# Patient Record
Sex: Female | Born: 1960 | Race: White | Hispanic: No | Smoking: Former smoker
Health system: Southern US, Community
[De-identification: ages and names within clinical notes are randomized; demographics above are authoritative.]

## PROBLEM LIST (undated history)

## (undated) DIAGNOSIS — E0590 Thyrotoxicosis, unspecified without thyrotoxic crisis or storm: Secondary | ICD-10-CM

## (undated) DIAGNOSIS — M1990 Unspecified osteoarthritis, unspecified site: Secondary | ICD-10-CM

## (undated) DIAGNOSIS — M545 Low back pain: Secondary | ICD-10-CM

## (undated) DIAGNOSIS — F1910 Other psychoactive substance abuse, uncomplicated: Secondary | ICD-10-CM

## (undated) DIAGNOSIS — M25552 Pain in left hip: Secondary | ICD-10-CM

## (undated) DIAGNOSIS — E785 Hyperlipidemia, unspecified: Secondary | ICD-10-CM

## (undated) DIAGNOSIS — L570 Actinic keratosis: Secondary | ICD-10-CM

## (undated) DIAGNOSIS — F329 Major depressive disorder, single episode, unspecified: Secondary | ICD-10-CM

## (undated) DIAGNOSIS — R51 Headache: Secondary | ICD-10-CM

## (undated) DIAGNOSIS — F319 Bipolar disorder, unspecified: Secondary | ICD-10-CM

## (undated) DIAGNOSIS — R519 Headache, unspecified: Secondary | ICD-10-CM

## (undated) DIAGNOSIS — F32A Depression, unspecified: Secondary | ICD-10-CM

## (undated) DIAGNOSIS — E538 Deficiency of other specified B group vitamins: Secondary | ICD-10-CM

## (undated) DIAGNOSIS — G8929 Other chronic pain: Secondary | ICD-10-CM

## (undated) DIAGNOSIS — F4310 Post-traumatic stress disorder, unspecified: Secondary | ICD-10-CM

## (undated) DIAGNOSIS — M87059 Idiopathic aseptic necrosis of unspecified femur: Secondary | ICD-10-CM

## (undated) DIAGNOSIS — Z87898 Personal history of other specified conditions: Secondary | ICD-10-CM

## (undated) DIAGNOSIS — R110 Nausea: Secondary | ICD-10-CM

## (undated) DIAGNOSIS — N390 Urinary tract infection, site not specified: Secondary | ICD-10-CM

## (undated) DIAGNOSIS — D126 Benign neoplasm of colon, unspecified: Secondary | ICD-10-CM

## (undated) DIAGNOSIS — F410 Panic disorder [episodic paroxysmal anxiety] without agoraphobia: Secondary | ICD-10-CM

## (undated) DIAGNOSIS — K5730 Diverticulosis of large intestine without perforation or abscess without bleeding: Secondary | ICD-10-CM

## (undated) DIAGNOSIS — Z800 Family history of malignant neoplasm of digestive organs: Secondary | ICD-10-CM

## (undated) DIAGNOSIS — F419 Anxiety disorder, unspecified: Secondary | ICD-10-CM

## (undated) DIAGNOSIS — I639 Cerebral infarction, unspecified: Secondary | ICD-10-CM

## (undated) DIAGNOSIS — K219 Gastro-esophageal reflux disease without esophagitis: Secondary | ICD-10-CM

## (undated) DIAGNOSIS — R011 Cardiac murmur, unspecified: Secondary | ICD-10-CM

## (undated) DIAGNOSIS — G629 Polyneuropathy, unspecified: Secondary | ICD-10-CM

## (undated) DIAGNOSIS — E039 Hypothyroidism, unspecified: Secondary | ICD-10-CM

## (undated) DIAGNOSIS — Z9889 Other specified postprocedural states: Secondary | ICD-10-CM

## (undated) DIAGNOSIS — R569 Unspecified convulsions: Secondary | ICD-10-CM

## (undated) HISTORY — DX: Other specified postprocedural states: Z98.890

## (undated) HISTORY — DX: Cardiac murmur, unspecified: R01.1

## (undated) HISTORY — DX: Personal history of other specified conditions: Z87.898

## (undated) HISTORY — DX: Polyneuropathy, unspecified: G62.9

## (undated) HISTORY — DX: Low back pain: M54.5

## (undated) HISTORY — DX: Major depressive disorder, single episode, unspecified: F32.9

## (undated) HISTORY — DX: Anxiety disorder, unspecified: F41.9

## (undated) HISTORY — DX: Deficiency of other specified B group vitamins: E53.8

## (undated) HISTORY — PX: COLONOSCOPY: SHX174

## (undated) HISTORY — DX: Benign neoplasm of colon, unspecified: D12.6

## (undated) HISTORY — DX: Thyrotoxicosis, unspecified without thyrotoxic crisis or storm: E05.90

## (undated) HISTORY — PX: EXTERNAL EAR SURGERY: SHX627

## (undated) HISTORY — DX: Nausea: R11.0

## (undated) HISTORY — DX: Hyperlipidemia, unspecified: E78.5

## (undated) HISTORY — PX: JOINT REPLACEMENT: SHX530

## (undated) HISTORY — DX: Bipolar disorder, unspecified: F31.9

## (undated) HISTORY — DX: Diverticulosis of large intestine without perforation or abscess without bleeding: K57.30

## (undated) HISTORY — DX: Family history of malignant neoplasm of digestive organs: Z80.0

## (undated) HISTORY — DX: Post-traumatic stress disorder, unspecified: F43.10

## (undated) HISTORY — PX: NECK SURGERY: SHX720

## (undated) HISTORY — DX: Hypothyroidism, unspecified: E03.9

## (undated) HISTORY — DX: Gastro-esophageal reflux disease without esophagitis: K21.9

## (undated) HISTORY — DX: Actinic keratosis: L57.0

## (undated) HISTORY — DX: Depression, unspecified: F32.A

## (undated) HISTORY — DX: Other psychoactive substance abuse, uncomplicated: F19.10

## (undated) HISTORY — PX: SKIN CANCER DESTRUCTION: SHX778

---

## 2006-07-21 ENCOUNTER — Ambulatory Visit (HOSPITAL_COMMUNITY): Admission: RE | Admit: 2006-07-21 | Discharge: 2006-07-21 | Payer: Self-pay | Admitting: Family Medicine

## 2008-10-07 ENCOUNTER — Emergency Department (HOSPITAL_COMMUNITY): Admission: EM | Admit: 2008-10-07 | Discharge: 2008-10-07 | Payer: Self-pay | Admitting: Internal Medicine

## 2009-08-16 ENCOUNTER — Observation Stay (HOSPITAL_COMMUNITY): Admission: EM | Admit: 2009-08-16 | Discharge: 2009-08-17 | Payer: Self-pay | Admitting: Emergency Medicine

## 2009-08-16 ENCOUNTER — Ambulatory Visit: Payer: Self-pay | Admitting: Cardiology

## 2009-08-17 ENCOUNTER — Encounter (INDEPENDENT_AMBULATORY_CARE_PROVIDER_SITE_OTHER): Payer: Self-pay | Admitting: Internal Medicine

## 2009-08-29 ENCOUNTER — Encounter: Payer: Self-pay | Admitting: Cardiology

## 2009-08-29 ENCOUNTER — Ambulatory Visit: Payer: Self-pay | Admitting: Cardiology

## 2009-08-29 ENCOUNTER — Ambulatory Visit (HOSPITAL_COMMUNITY): Admission: RE | Admit: 2009-08-29 | Discharge: 2009-08-29 | Payer: Self-pay | Admitting: Cardiology

## 2009-09-04 ENCOUNTER — Emergency Department (HOSPITAL_COMMUNITY): Admission: EM | Admit: 2009-09-04 | Discharge: 2009-09-04 | Payer: Self-pay | Admitting: Emergency Medicine

## 2010-05-16 ENCOUNTER — Encounter: Payer: Self-pay | Admitting: Internal Medicine

## 2010-05-16 ENCOUNTER — Ambulatory Visit: Payer: Self-pay | Admitting: Gastroenterology

## 2010-05-16 DIAGNOSIS — R634 Abnormal weight loss: Secondary | ICD-10-CM | POA: Insufficient documentation

## 2010-05-16 DIAGNOSIS — K219 Gastro-esophageal reflux disease without esophagitis: Secondary | ICD-10-CM

## 2010-05-16 DIAGNOSIS — R1310 Dysphagia, unspecified: Secondary | ICD-10-CM

## 2010-05-16 DIAGNOSIS — R197 Diarrhea, unspecified: Secondary | ICD-10-CM | POA: Insufficient documentation

## 2010-05-16 DIAGNOSIS — R109 Unspecified abdominal pain: Secondary | ICD-10-CM | POA: Insufficient documentation

## 2010-05-16 DIAGNOSIS — R112 Nausea with vomiting, unspecified: Secondary | ICD-10-CM | POA: Insufficient documentation

## 2010-05-20 ENCOUNTER — Encounter: Payer: Self-pay | Admitting: Internal Medicine

## 2010-05-30 ENCOUNTER — Ambulatory Visit: Payer: Self-pay | Admitting: Internal Medicine

## 2010-05-30 ENCOUNTER — Ambulatory Visit (HOSPITAL_COMMUNITY): Admission: RE | Admit: 2010-05-30 | Discharge: 2010-05-30 | Payer: Self-pay | Admitting: Internal Medicine

## 2010-05-30 DIAGNOSIS — Z9889 Other specified postprocedural states: Secondary | ICD-10-CM

## 2010-05-30 DIAGNOSIS — D126 Benign neoplasm of colon, unspecified: Secondary | ICD-10-CM

## 2010-05-30 HISTORY — PX: ESOPHAGOGASTRODUODENOSCOPY: SHX1529

## 2010-05-30 HISTORY — DX: Other specified postprocedural states: Z98.890

## 2010-05-30 HISTORY — PX: COLONOSCOPY: SHX174

## 2010-05-30 HISTORY — DX: Benign neoplasm of colon, unspecified: D12.6

## 2010-06-06 ENCOUNTER — Encounter: Payer: Self-pay | Admitting: Internal Medicine

## 2010-06-06 ENCOUNTER — Telehealth (INDEPENDENT_AMBULATORY_CARE_PROVIDER_SITE_OTHER): Payer: Self-pay

## 2010-06-11 ENCOUNTER — Encounter (INDEPENDENT_AMBULATORY_CARE_PROVIDER_SITE_OTHER): Payer: Self-pay | Admitting: *Deleted

## 2010-06-17 ENCOUNTER — Encounter: Payer: Self-pay | Admitting: Gastroenterology

## 2010-06-17 ENCOUNTER — Encounter: Payer: Self-pay | Admitting: Internal Medicine

## 2010-06-17 ENCOUNTER — Telehealth (INDEPENDENT_AMBULATORY_CARE_PROVIDER_SITE_OTHER): Payer: Self-pay

## 2010-06-18 ENCOUNTER — Encounter: Payer: Self-pay | Admitting: Gastroenterology

## 2010-06-20 ENCOUNTER — Encounter: Payer: Self-pay | Admitting: Internal Medicine

## 2010-06-21 ENCOUNTER — Ambulatory Visit (HOSPITAL_COMMUNITY): Admission: RE | Admit: 2010-06-21 | Discharge: 2010-06-21 | Payer: Self-pay | Admitting: Internal Medicine

## 2010-06-28 LAB — CONVERTED CEMR LAB
Bilirubin, Direct: 0.1 mg/dL (ref 0.0–0.3)
Total Bilirubin: 0.5 mg/dL (ref 0.3–1.2)

## 2010-07-08 ENCOUNTER — Ambulatory Visit: Payer: Self-pay | Admitting: Internal Medicine

## 2010-07-08 DIAGNOSIS — R42 Dizziness and giddiness: Secondary | ICD-10-CM

## 2010-07-08 DIAGNOSIS — D126 Benign neoplasm of colon, unspecified: Secondary | ICD-10-CM | POA: Insufficient documentation

## 2010-07-08 LAB — CONVERTED CEMR LAB
Alcohol, Ethyl (B): <10 mg/dL (ref 0–10)
Amphetamine Screen, Ur: NEGATIVE
Barbiturate Quant, Ur: NEGATIVE
Basophils Absolute: 0 10*3/uL (ref 0.0–0.1)
Benzodiazepines.: NEGATIVE
Calcium: 9.2 mg/dL (ref 8.4–10.5)
Chloride: 105 meq/L (ref 96–112)
Creatinine, Ser: 1.18 mg/dL (ref 0.40–1.20)
Eosinophils Absolute: 0.2 10*3/uL (ref 0.0–0.7)
Eosinophils Relative: 3 % (ref 0–5)
Glucose, Bld: 98 mg/dL (ref 70–99)
HCT: 41.6 % (ref 36.0–46.0)
Hemoglobin, Urine: NEGATIVE
Hemoglobin: 13.5 g/dL (ref 12.0–15.0)
Leukocytes, UA: NEGATIVE
MCHC: 32.5 g/dL (ref 30.0–36.0)
MCV: 102 fL — ABNORMAL HIGH (ref 78.0–100.0)
Monocytes Relative: 5 % (ref 3–12)
Opiate Screen, Urine: NEGATIVE
Phencyclidine (PCP): NEGATIVE
Potassium: 4.5 meq/L (ref 3.5–5.3)
Propoxyphene: NEGATIVE
Urobilinogen, UA: 0.2 (ref 0.0–1.0)
WBC: 6.9 10*3/uL (ref 4.0–10.5)

## 2010-07-10 ENCOUNTER — Ambulatory Visit (HOSPITAL_COMMUNITY): Admission: RE | Admit: 2010-07-10 | Discharge: 2010-07-10 | Payer: Self-pay | Admitting: Internal Medicine

## 2010-07-11 ENCOUNTER — Encounter: Payer: Self-pay | Admitting: Gastroenterology

## 2010-07-16 ENCOUNTER — Encounter (INDEPENDENT_AMBULATORY_CARE_PROVIDER_SITE_OTHER): Payer: Self-pay | Admitting: *Deleted

## 2010-07-19 ENCOUNTER — Encounter: Payer: Self-pay | Admitting: Urgent Care

## 2010-07-22 ENCOUNTER — Encounter (INDEPENDENT_AMBULATORY_CARE_PROVIDER_SITE_OTHER): Payer: Self-pay | Admitting: *Deleted

## 2010-08-12 ENCOUNTER — Ambulatory Visit (HOSPITAL_COMMUNITY): Admission: RE | Admit: 2010-08-12 | Discharge: 2010-08-12 | Payer: Self-pay | Admitting: Family Medicine

## 2010-08-21 ENCOUNTER — Ambulatory Visit (HOSPITAL_COMMUNITY): Admission: RE | Admit: 2010-08-21 | Discharge: 2010-08-21 | Payer: Self-pay | Admitting: Family Medicine

## 2010-11-13 ENCOUNTER — Encounter: Payer: Self-pay | Admitting: Internal Medicine

## 2010-12-24 NOTE — Letter (Signed)
Summary: Internal Other /procedure instructions  Internal Other /procedure instructions   Imported By: Cloria Spring LPN 21/30/8657 84:69:62  _____________________________________________________________________  External Attachment:    Type:   Image     Comment:   External Document

## 2010-12-24 NOTE — Progress Notes (Signed)
Summary: phone note/ still having problems post EGD/TCS  Phone Note Call from Patient   Caller: Patient Summary of Call: Pt called to say she is still having problems. Lower abdominal pain and cramps. Still can't eat. When she does try, nausea and some vomiting. Some diarrhea. Had recent EGD/TCS. Please advise! Initial call taken by: Cloria Spring LPN,  June 06, 2010 1:12 PM     Appended Document: phone note/ still having problems post EGD/TCS levsin 0.125 mg tab disp #30 ; take one sublingually before meal and at bedtime; office visit w extender 3-4 weeks   Appended Document: phone note/ still having problems post EGD/TCS Pt informed. Rx called to [facility] @ [facility] [employer].  Appended Document: phone note/ still having problems post EGD/TCS pt was mailed a letter for appt of 07/08/10 @ 2pm w/KJ

## 2010-12-24 NOTE — Medication Information (Signed)
Summary: Tax adviser   Imported By: Rosine Beat 07/22/2010 09:49:37  _____________________________________________________________________  External Attachment:    Type:   Image     Comment:   External Document

## 2010-12-24 NOTE — Letter (Signed)
Summary: Patient Notice, Colon Biopsy Results  [facility]  [address]   [place], [place] [address]   Phone: [phone]  Fax: [phone]       June 06, 2010   AARICA WAX [address] [place], [place]  [address] 07-27-1961    Dear Ms. Secord,  I am pleased to inform you that the biopsies taken during your recent colonoscopy did not show any evidence of cancer upon pathologic examination. There was no evidence of colitis.  Additional information/recommendations:  You should have a repeat colonoscopy examination  in 7 years.  Please call us if you are having persistent problems or have questions about your condition that have not been fully answered at this time.  Sincerely,    R. Roetta Sessions MD, FACP [facility] Ph: [phone]    Fax: [phone]   Appended Document: Patient Notice, Colon Biopsy Results Letter mailed to pt.  Appended Document: Patient Notice, Colon Biopsy Results reminder in computer

## 2010-12-24 NOTE — Assessment & Plan Note (Signed)
Summary: ov w/extender in 3-4weeks/diarrhea/ss   Visit Type:  Follow-up Visit Primary Care Provider:  Manson Passey [facility]  Chief Complaint:  F/U diarrhea/abd cramps.  History of Present Illness: 50 y/o caucasian Hall here for FU abd pain/IBS.  Stopped hyomax due to bloating, anorexia, nausea & vomiting.  c/o low abd pain.  c/o sudden urge to vomit and diarrhea.  Vomiting daily couple times.  Wt down 3# x 3 months.  Lithium level check recently, but pt cannot remember if it was normal [facility]).  BM 4-5 per day mostly liquid.  Pain not relieved w/ defecation.  Pain 10/10 at worst pressure like "labor pains".    Denies fever/chills.  Hx thyroid disease recently checked and normal per pt.  Recent colonsocpy/EGD did not reveal etiology of abd pain, constant nausea, diarrhea, & wt loss.    LFTs  and recent abd [place] benign.  Current Problems (verified): 1)  Hx of Colonic Polyps, Adenomatous  (ICD-211.3) 2)  Abdominal Pain, Unspecified Site  (ICD-789.00) 3)  Neoplasm, Malignant, Colon, Family Hx, Father  (ICD-V16.0) 4)  Diarrhea  (ICD-787.91) 5)  Dysphagia Unspecified  (ICD-787.20) 6)  Gerd  (ICD-530.81) 7)  Weight Loss  (ICD-783.21) 8)  Nausea With Vomiting  (ICD-787.01)  Current Medications (verified): 1)  Levothroid 50 Mcg Tabs (Levothyroxine Sodium) .... Take 1 Tablet By Mouth Once A Day 2)  Lexapro 20 Mg Tabs (Escitalopram Oxalate) .... Take 1 Tablet By Mouth Once A Day 3)  Clonazepam 0.5 Mg Tabs (Clonazepam) .... Take 1 Tablet By Mouth Three Times A Day 4)  Lithium Carbonate 300 Mg Caps (Lithium Carbonate) .... Take 1 Tablet By Mouth Two Times A Day 5)  Trazodone Hcl 100 Mg Tabs (Trazodone Hcl) .... Take 1 Tablet By Mouth Once A Day 6)  Dexilant 60 Mg Cpdr (Dexlansoprazole) .... One By Mouth 30 Mins Before Breakfast Daily  Allergies (verified): 1)  ! * Hyomax  Review of Systems General:  Complains of anorexia, fatigue, weakness, and malaise; denies fever,  chills, sweats, weight loss, and sleep disorder. CV:  Denies chest pains, angina, palpitations, syncope, dyspnea on exertion, orthopnea, PND, peripheral edema, and claudication. Resp:  Denies dyspnea at rest, dyspnea with exercise, cough, sputum, wheezing, coughing up blood, and pleurisy. GI:  See HPI. Neuro:  dizziness. Psych:  Complains of depression, anxiety, memory loss, and confusion; sees Mental health in [place].  Vital Signs:  Patient profile:   50 year old Hall Height:      66 inches Weight:      166.50 pounds BMI:     26.97 Temp:     98.7 degrees F oral Pulse rate:   64 / minute BP sitting:   102 / 78  (left arm) Cuff size:   regular  Vitals Entered By: Cloria Spring LPN (July 08, 2010 2:17 PM)  Physical Exam  General:  Well developed, well nourished, no acute distress. Head:  Normocephalic and atraumatic. Eyes:  sclera nonicteric Mouth:  No deformity or lesions, dentition normal. Neck:  Supple; no masses or thyromegaly. Heart:  Regular rate and rhythm; no murmurs, rubs,  or bruits. Abdomen:  Soft, c/o significant tenderness to suprapubic area and lower abd, nondistended. No masses, hepatosplenomegaly or hernias noted. Normal bowel sounds.without guarding and without rebound.   Extremities:  No clubbing, cyanosis, edema or deformities noted. Neurologic:  Appears anxious Skin:  Intact without significant lesions or rashes. Cervical Nodes:  No significant cervical adenopathy. Psych:  anxious, easily distracted, poor concentration, and  poor memory.    Impression & Recommendations:  Problem # 1:  ABDOMINAL PAIN, UNSPECIFIED SITE (ICD-789.00) Mostly lower, but generalized abd pain, with bouts of chronic nausea, vomiting & diarrhea.  Differentials include functional abd pain w/ psychiatric overlay ?ongoing polysubstance abuse, med side effect, UTI, celiac disease, less likely lymphoma.   Orders: T-Basic Metabolic Panel [phone]) T-CBC w/Diff  [phone]) T-Urinalysis [phone]) T-Alcohol [phone]) T-Drug Screen-Urine, ea (mullti) [phone]) T-Celiac Disease Ab Evaluation (8002) Est. Patient Level III (02725)  Problem # 2:  GERD (ICD-530.81)  See #1  Orders: Est. Patient Level III ([address]644)  Problem # 3:  DIARRHEA (ICD-787.91) Stool studies, TSH unremarkable. Nothing to explain diarrhea on recent colonoscopy.  H/o polypharmacy, psychiatric illness and prior alcohol abuse.  Differentials include IBS, celiac disease, medication side effect.  Problem # 4:  Hx of COLONIC POLYPS, ADENOMATOUS (ICD-211.3)  Next colonoscopy in 7 yrs  Orders: Est. Patient Level III (03474)  Problem # 5:  NAUSEA WITH VOMITING (ICD-787.01) ? gastroparesis  Patient Instructions: 1)  GO get your labs today 2)  Call your regular doctor for appt re: dizziness 3)  To ER for severe pain, Nausea or vomiting 4)  I will call w/ CT results

## 2010-12-24 NOTE — Miscellaneous (Signed)
Summary: Orders Update  Clinical Lists Changes  Orders: Added new Test order of T-Hepatic Function ([id number]) - Signed 

## 2010-12-24 NOTE — Medication Information (Signed)
Summary: DEXILANT 60MG   DEXILANT 60MG    Imported By: Rexene Alberts 07/11/2010 13:51:47  _____________________________________________________________________  External Attachment:    Type:   Image     Comment:   External Document  Appended Document: DEXILANT 60MG  pa done

## 2010-12-24 NOTE — Letter (Signed)
Summary: X-RAY ORDER-CT ABD/PELVIS  X-RAY ORDER-CT ABD/PELVIS   Imported By: Rosine Beat 07/19/2010 14:05:42  _____________________________________________________________________  External Attachment:    Type:   Image     Comment:   External Document

## 2010-12-24 NOTE — Letter (Signed)
Summary: ABD U/S order  ABD U/S order   Imported By: Minna Merritts 06/17/2010 16:56:41  _____________________________________________________________________  External Attachment:    Type:   Image     Comment:   External Document

## 2010-12-24 NOTE — Assessment & Plan Note (Signed)
Summary: DIARRHEA,HX OF POLYPS,CONSULT FOR TCS/SS   Visit Type:  Initial Consult Referring Provider:  Lynnea Ferrier Primary Care Provider:  Lynnea Ferrier  Chief Complaint:  diarrhea.  History of Present Illness: [name] is a pleasant 50 y/o WF, patient of Dr. Tanya Nones at [facility], who is here today for further evaluation of diarrhea, nausea, need for colonoscopy. She c/o six month h/o daily nausea. She wakes up at times with vomiting especially if she eats late at night. She has had diarrhea for four weeks. Poor appetite.  Fatigue. Has trouble eating solid foods. Lower abd pain/epigastric pain with radiation to lower back. Bad heartburn, prilosec doesn't help. Failed Nexium previously. Problems swallowing solid foods. Food sticks. No melena, brbpr. Initially, bms, profuse watery stools. Some decrease in number now.  Stool culture, wbc, c.diff, o+p all negative. TSH 4.5 on synthroid. CBC, LFTs, Met-7 unremarkable.   Last TCS seven years ago [facility]).  She states she had diverticula. Not sure about polyps. Father, patient of Dr. Jena Gauss, diagnosed with colon cancer on screening exam at age 38 (over 15 years ago).   Current Medications (verified): 1)  Levothroid 50 Mcg Tabs (Levothyroxine Sodium) .... Take 1 Tablet By Mouth Once A Day 2)  Lexapro 20 Mg Tabs (Escitalopram Oxalate) .... Take 1 Tablet By Mouth Once A Day 3)  Clonazepam 0.5 Mg Tabs (Clonazepam) .... Take 1 Tablet By Mouth Three Times A Day 4)  Lithium Carbonate 300 Mg Caps (Lithium Carbonate) .... Take 1 Tablet By Mouth Two Times A Day 5)  Trazodone Hcl 100 Mg Tabs (Trazodone Hcl) .... Take 1 Tablet By Mouth Once A Day 6)  Prilotec Otc .... 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 TCS, seven years ago, per patient, diverticulosis. Done  at [facility].   Past Surgical History: Neck surgery Left ear,nasal, tear duct surgery s/p trauma as child  Family History: Brother, CAD Mother, DM Father, colon cancer, age 64 (15 years ago)  Social History: One grown child. Recently engaged. Disability for mental illness.   Patient currently smokes, less than pack per day. Previous heavy drinker but stopped six years ago. No illicit drugs.  Smoking Status:  current  Review of Systems General:  Complains of weight loss; denies fever, chills, sweats, anorexia, fatigue, and weakness; Weight from 199 to 160, now 169.. Eyes:  Denies vision loss. ENT:  Complains of difficulty swallowing; denies nasal congestion. CV:  Denies chest pains, angina, palpitations, dyspnea on exertion, and peripheral edema. Resp:  Denies dyspnea at rest, dyspnea with exercise, cough, sputum, and wheezing. GI:  See HPI. GU:  Denies urinary burning and blood in urine. MS:  Denies joint pain / LOM. Derm:  Denies rash and itching. Neuro:  Complains of memory loss; denies weakness, frequent headaches, and confusion. Psych:  Complains of depression and anxiety; denies suicidal ideation. Endo:  Complains of unusual weight change. Heme:  Denies bruising and bleeding. Allergy:  Denies hives and rash.  Vital Signs:  Patient profile:   50 year old female Height:      66 inches Weight:      169 pounds BMI:     27.38 Temp:     97.9 degrees F oral Pulse rate:   72 / minute BP sitting:   120 / 76  (left arm) Cuff size:   regular  Vitals Entered By: Cloria Spring LPN (  May 16, 2010 2:03 PM)  Physical Exam  General:  Well developed, well nourished, no acute distress. Head:  Normocephalic and atraumatic. Eyes:  sclera nonicteric Mouth:  op moist Neck:  Supple; no masses or thyromegaly. Lungs:  Clear throughout to auscultation. Heart:  Regular rate and rhythm; no murmurs, rubs,  or bruits. Abdomen:  Mild epig tenderness. Positive BS. No HSM or masses. No abd bruit  or hernia. No rebound or guarding. Rectal:  deferred until time of colonoscopy.   Extremities:  No clubbing, cyanosis, edema or deformities noted. Neurologic:  Alert and  oriented x4;  grossly normal neurologically. Skin:  Intact without significant lesions or rashes. Cervical Nodes:  No significant cervical adenopathy. Psych:  anxious.    Impression & Recommendations:  Problem # 1:  DIARRHEA (ICD-787.91)  Four week h/o diarrhea associated with lower abd pain. Stool studies, TSH unremarkable. Patient denies recent medication changes. Her last TCS was seven years ago at [facility]. ?IBS, microscopic colitis, less likely infectious or malignancy or IBD. Due to her h/o polypharmacy and prior alcohol abuse, poor conscious sedation at [facility], recommend procedure in OR. Colonoscopy to be performed in near future.  Risks, alternatives, and benefits including but not limited to the risk of reaction to medication, bleeding, infection, and perforation were addressed.  Patient voiced understanding and provided verbal consent.   Orders: Consultation Level IV ([address])  Problem # 2:  NEOPLASM, MALIGNANT, COLON, FAMILY HX, FATHER (ICD-V16.0)  Overdue for surveillance TCS. see #1.  Orders: Consultation Level IV ([address])  Problem # 3:  NAUSEA WITH VOMITING (ICD-787.01)  Chronic nausea with intermittent vomiting, in setting of refractory GERD, epigastric pain, and psychiatric illnesses. She c/o anorexia and inability to eat solid foods. She c/o solid food dysphagia. She had weight loss of over 30 pounds in last year for unclear reasons. We need to exclude PUD, complicated GERD, esophageal stricture, malignancy. Gallbladder remains in situ but would not explain all of her symptoms. EGD/ED to be performed in near future.  Risks, alternatives, benefits including but not limited to risk of reaction to medications, bleeding, infection, and perforation addressed.  Patient voiced understanding and verbal consent obtained.    Stop Prilosec. Begin Dexilant. #20 samples given.   Orders: Consultation Level IV ([address]11)  Patient Instructions: 1)  EGD and colonoscopy as planned. 2)  Stop Prilosec. 3)  Start Dexilant one by mouth 30 mins before first meal of day. 4)  Avoid foods high in acid content ( tomatoes, citrus juices, spicy foods) . Avoid eating within 3 to 4 hours of lying down or before exercising. Do not over eat; try smaller more frequent meals. Elevate head of bed four inches when sleeping.  5)  The medication list was reviewed and reconciled.  All changed / newly prescribed medications were explained.  A complete medication list was provided to the patient / caregiver.  I would like to thank Dr. Tanya Nones for allowing [place] to take part in the care of this nice patient.

## 2010-12-24 NOTE — Progress Notes (Signed)
Summary: phone note/ abd swelling/ vomiting  Phone Note Call from Patient   Caller: Patient Summary of Call: Pt called and said she is still having abd swelling every day, and then it goes down. She has no appetite, and when she does eat something she vomits it right back up. She does not have diarrhea. She was given Hyomax rx by Dr. Jena Gauss when she had her procedure, and she thinks that medicine is causing some of the problem. Please advise! ( Call back number is [phone] ) Pt is aware she has appt with Tommy Rainwater to follow-up on 07/08/2010 @ 2:00 pm. Initial call taken by: Cloria Spring LPN,  June 17, 2010 10:44 AM     Appended Document: phone note/ abd swelling/ vomiting Hold hyomax. LFTs. Abd U/S. Keep OV as planned.  Appended Document: phone note/ abd swelling/ vomiting Pt informed of all of the above. Lab order being faxed to [facility].   Appended Document: phone note/ abd swelling/ vomiting ABD u/s scheduled for Friday 06/21/10 @ 1:30pm, ordered faxed and patient aware.

## 2010-12-24 NOTE — Letter (Signed)
Summary: MDCD precert  MDCD precert   Imported By: Minna Merritts 06/20/2010 17:15:12  _____________________________________________________________________  External Attachment:    Type:   Image     Comment:   External Document

## 2010-12-24 NOTE — Letter (Signed)
Summary: Scheduled Appointment  [facility]  [address]   [place], [place] [address]   Phone: [phone]  Fax: [phone]    June 11, 2010   Dear: Sheryl Hall            DOB: [birth year]    I have been instructed to schedule you an appointment in our office.  Your appointment is as follows:   Date:               07/08/10   Time:               2pm     Please be here 15 minutes early.   Provider:           Lorenza Burton, [facility]    Please contact the office if you need to reschedule this appointment for a more convenient time.   Thank you,    Diana Eves       [facility] Ph: [phone]   Fax: [phone]

## 2010-12-24 NOTE — Letter (Signed)
Summary: referral from dr. Tanya Nones  referral from dr. Tanya Nones   Imported By: Rosine Beat 05/20/2010 10:42:39  _____________________________________________________________________  External Attachment:    Type:   Image     Comment:   External Document

## 2010-12-24 NOTE — Letter (Signed)
Summary: Patient Discharge Letter  [facility]  [address]   [place], [place] [address]   Phone: [phone]  Fax: [phone]    July 16, 2010  Sheryl Hall [address] [place], [place]  [address] [birth year]   Dear [name],   This is to inform you our intention to terminate the physician/patient relationship.  The physician/patient relationship is no longer productive due to illicit drug use that undermines the physician/patient relationship.  Therefore, we will no longer be responsible for your care effective Sept 24, 2011.  Upon proper authorization, we will be glad to provide a copy of your medical records to the Gastroenterologist of your choice.  You may consult your family physician and/or yellow pages of the phone directory to find another qualified Gastroenterologist to provide your care.  It is important that you follow up with another Gastroenterologist for your medical needs.  Please do not neglect your health.   Best Regards,      R. Roetta Sessions, MD [facility] Ph: [phone]    Fax: [phone]

## 2010-12-26 NOTE — Letter (Signed)
Summary: RETURNED CERTIFIED MAIL  RETURNED CERTIFIED MAIL   Imported By: Rexene Alberts 11/13/2010 10:02:23  _____________________________________________________________________  External Attachment:    Type:   Image     Comment:   External Document

## 2011-01-09 ENCOUNTER — Encounter: Payer: Self-pay | Admitting: Gastroenterology

## 2011-01-13 ENCOUNTER — Encounter: Payer: Self-pay | Admitting: Urgent Care

## 2011-01-14 ENCOUNTER — Encounter: Payer: Self-pay | Admitting: Urgent Care

## 2011-01-15 NOTE — Medication Information (Signed)
Summary: DEXILANT 60MG   DEXILANT 60MG    Imported By: Rexene Alberts 01/09/2011 09:21:31  _____________________________________________________________________  External Attachment:    Type:   Image     Comment:   External Document  Appended Document: DEXILANT 60MG  discharged from practice. no refills.   Appended Document: DEXILANT 60MG  Informed pharmacist at [facility] that pt is no longer a pt here.

## 2011-01-17 ENCOUNTER — Encounter (INDEPENDENT_AMBULATORY_CARE_PROVIDER_SITE_OTHER): Payer: Self-pay | Admitting: *Deleted

## 2011-01-21 NOTE — Medication Information (Signed)
Summary: DEXILANT 60MG   DEXILANT 60MG    Imported By: Rexene Alberts 01/17/2011 08:54:29  _____________________________________________________________________  External Attachment:    Type:   Image     Comment:   External Document  Appended Document: DEXILANT 60MG  Duplicate

## 2011-01-21 NOTE — Medication Information (Signed)
Summary: Tax adviser   Imported By: Rexene Alberts 01/14/2011 13:57:36  _____________________________________________________________________  External Attachment:    Type:   Image     Comment:   External Document  Appended Document: RX Folder Pt d/c'd from practice

## 2011-01-21 NOTE — Medication Information (Signed)
Summary: DEXILANT 60MG   DEXILANT 60MG    Imported By: Rexene Alberts 01/13/2011 14:22:20  _____________________________________________________________________  External Attachment:    Type:   Image     Comment:   External Document  Appended Document: DEXILANT 60MG  Pt d/c'd from practice

## 2011-01-30 ENCOUNTER — Other Ambulatory Visit: Payer: Self-pay | Admitting: Obstetrics and Gynecology

## 2011-02-09 LAB — CBC
HCT: 43.2 % (ref 36.0–46.0)
Hemoglobin: 14.4 g/dL (ref 12.0–15.0)
MCH: 33.6 pg (ref 26.0–34.0)
MCHC: 33.4 g/dL (ref 30.0–36.0)
MCV: 100.5 fL — ABNORMAL HIGH (ref 78.0–100.0)
Platelets: 184 10*3/uL (ref 150–400)
RDW: 14.5 % (ref 11.5–15.5)

## 2011-02-09 LAB — BASIC METABOLIC PANEL
CO2: 27 mEq/L (ref 19–32)
Calcium: 9.1 mg/dL (ref 8.4–10.5)
GFR calc Af Amer: 53 mL/min — ABNORMAL LOW (ref >60)
GFR calc non Af Amer: 44 mL/min — ABNORMAL LOW (ref >60)
Glucose, Bld: 121 mg/dL — ABNORMAL HIGH (ref 70–99)
Sodium: 139 mEq/L (ref 135–145)

## 2011-02-28 LAB — DIFFERENTIAL
Basophils Absolute: 0 10*3/uL (ref 0.0–0.1)
Basophils Absolute: 0.1 10*3/uL (ref 0.0–0.1)
Basophils Relative: 0 % (ref 0–1)
Eosinophils Absolute: 0.3 10*3/uL (ref 0.0–0.7)
Eosinophils Relative: 3 % (ref 0–5)
Eosinophils Relative: 4 % (ref 0–5)
Lymphocytes Relative: 27 % (ref 12–46)
Lymphocytes Relative: 37 % (ref 12–46)
Lymphs Abs: 3 10*3/uL (ref 0.7–4.0)
Monocytes Absolute: 0.4 10*3/uL (ref 0.1–1.0)
Monocytes Absolute: 0.5 10*3/uL (ref 0.1–1.0)
Monocytes Relative: 5 % (ref 3–12)
Monocytes Relative: 6 % (ref 3–12)
Neutro Abs: 4.3 10*3/uL (ref 1.7–7.7)
Neutro Abs: 6 10*3/uL (ref 1.7–7.7)
Neutrophils Relative %: 53 % (ref 43–77)

## 2011-02-28 LAB — COMPREHENSIVE METABOLIC PANEL
Albumin: 3.2 g/dL — ABNORMAL LOW (ref 3.5–5.2)
Alkaline Phosphatase: 83 U/L (ref 39–117)
CO2: 29 mEq/L (ref 19–32)
Chloride: 107 mEq/L (ref 96–112)
Creatinine, Ser: 0.9 mg/dL (ref 0.4–1.2)
GFR calc non Af Amer: >60 mL/min (ref >60)
Total Bilirubin: 0.5 mg/dL (ref 0.3–1.2)

## 2011-02-28 LAB — RAPID URINE DRUG SCREEN, HOSP PERFORMED
Amphetamines: NOT DETECTED
Benzodiazepines: POSITIVE — AB
Cocaine: NOT DETECTED
Opiates: POSITIVE — AB

## 2011-02-28 LAB — LIPID PANEL
Cholesterol: 165 mg/dL (ref 0–200)
LDL Cholesterol: 122 mg/dL — ABNORMAL HIGH (ref 0–99)
Triglycerides: 78 mg/dL (ref <150)
VLDL: 16 mg/dL (ref 0–40)

## 2011-02-28 LAB — TSH: TSH: 0.019 u[IU]/mL — ABNORMAL LOW (ref 0.350–4.500)

## 2011-02-28 LAB — LITHIUM LEVEL: Lithium Lvl: 1.27 mEq/L (ref 0.80–1.40)

## 2011-02-28 LAB — CARDIAC PANEL(CRET KIN+CKTOT+MB+TROPI)
CK, MB: 0.6 ng/mL (ref 0.3–4.0)
CK, MB: 0.6 ng/mL (ref 0.3–4.0)
Relative Index: INVALID (ref 0.0–2.5)
Total CK: 25 U/L (ref 7–177)
Troponin I: 0.02 ng/mL (ref 0.00–0.06)

## 2011-02-28 LAB — CBC
Platelets: 209 10*3/uL (ref 150–400)
RDW: 14.4 % (ref 11.5–15.5)
WBC: 8.2 10*3/uL (ref 4.0–10.5)

## 2011-02-28 LAB — URINE CULTURE
Colony Count: NO GROWTH
Culture: NO GROWTH
Special Requests: NEGATIVE

## 2011-02-28 LAB — PROTIME-INR
INR: 1 (ref 0.00–1.49)
Prothrombin Time: 12.7 seconds (ref 11.6–15.2)

## 2011-02-28 LAB — BASIC METABOLIC PANEL: Chloride: 106 mEq/L (ref 96–112)

## 2011-02-28 LAB — MAGNESIUM: Magnesium: 2 mg/dL (ref 1.5–2.5)

## 2011-02-28 LAB — APTT: aPTT: 32 seconds (ref 24–37)

## 2011-02-28 LAB — PHOSPHORUS: Phosphorus: 3.1 mg/dL (ref 2.3–4.6)

## 2011-02-28 LAB — POCT CARDIAC MARKERS: CKMB, poc: <1 ng/mL — ABNORMAL LOW (ref 1.0–8.0)

## 2011-03-18 ENCOUNTER — Emergency Department (HOSPITAL_COMMUNITY)
Admission: EM | Admit: 2011-03-18 | Discharge: 2011-03-18 | Disposition: A | Discharge status: Home / Self Care | Payer: Medicaid Other | Attending: Emergency Medicine | Admitting: Emergency Medicine

## 2011-03-18 ENCOUNTER — Emergency Department (HOSPITAL_COMMUNITY): Payer: Medicaid Other

## 2011-03-18 DIAGNOSIS — Z79899 Other long term (current) drug therapy: Secondary | ICD-10-CM | POA: Insufficient documentation

## 2011-03-18 DIAGNOSIS — F4310 Post-traumatic stress disorder, unspecified: Secondary | ICD-10-CM | POA: Insufficient documentation

## 2011-03-18 DIAGNOSIS — E039 Hypothyroidism, unspecified: Secondary | ICD-10-CM | POA: Insufficient documentation

## 2011-03-18 DIAGNOSIS — J40 Bronchitis, not specified as acute or chronic: Secondary | ICD-10-CM | POA: Insufficient documentation

## 2011-03-18 LAB — CBC
Hemoglobin: 13.6 g/dL (ref 12.0–15.0)
MCH: 32.1 pg (ref 26.0–34.0)
MCV: 99.8 fL (ref 78.0–100.0)
RBC: 4.24 MIL/uL (ref 3.87–5.11)

## 2011-03-18 LAB — POCT I-STAT, CHEM 8
BUN: <3 mg/dL — ABNORMAL LOW (ref 6–23)
Calcium, Ion: 1.16 mmol/L (ref 1.12–1.32)
Chloride: 108 mEq/L (ref 96–112)
Creatinine, Ser: 1.4 mg/dL — ABNORMAL HIGH (ref 0.4–1.2)

## 2011-03-18 LAB — DIFFERENTIAL
Eosinophils Absolute: 0.3 10*3/uL (ref 0.0–0.7)
Lymphs Abs: 2.3 10*3/uL (ref 0.7–4.0)
Monocytes Relative: 6 % (ref 3–12)
Neutro Abs: 4.3 10*3/uL (ref 1.7–7.7)
Neutrophils Relative %: 58 % (ref 43–77)

## 2011-06-24 ENCOUNTER — Encounter: Payer: Self-pay | Admitting: Family Medicine

## 2011-07-15 ENCOUNTER — Other Ambulatory Visit: Payer: Self-pay | Admitting: Physician Assistant

## 2011-07-15 ENCOUNTER — Other Ambulatory Visit: Payer: Self-pay | Admitting: Family Medicine

## 2011-07-15 DIAGNOSIS — Z139 Encounter for screening, unspecified: Secondary | ICD-10-CM

## 2011-07-17 ENCOUNTER — Emergency Department (HOSPITAL_COMMUNITY): Payer: Medicaid Other

## 2011-07-17 ENCOUNTER — Emergency Department (HOSPITAL_COMMUNITY)
Admission: EM | Admit: 2011-07-17 | Discharge: 2011-07-17 | Disposition: A | Discharge status: Home / Self Care | Payer: Medicaid Other | Attending: Emergency Medicine | Admitting: Emergency Medicine

## 2011-07-17 ENCOUNTER — Encounter (HOSPITAL_COMMUNITY): Payer: Self-pay | Admitting: Emergency Medicine

## 2011-07-17 DIAGNOSIS — F17200 Nicotine dependence, unspecified, uncomplicated: Secondary | ICD-10-CM | POA: Insufficient documentation

## 2011-07-17 DIAGNOSIS — S139XXA Sprain of joints and ligaments of unspecified parts of neck, initial encounter: Secondary | ICD-10-CM | POA: Insufficient documentation

## 2011-07-17 DIAGNOSIS — F319 Bipolar disorder, unspecified: Secondary | ICD-10-CM | POA: Insufficient documentation

## 2011-07-17 DIAGNOSIS — S335XXA Sprain of ligaments of lumbar spine, initial encounter: Secondary | ICD-10-CM

## 2011-07-17 DIAGNOSIS — M25569 Pain in unspecified knee: Secondary | ICD-10-CM

## 2011-07-17 DIAGNOSIS — S161XXA Strain of muscle, fascia and tendon at neck level, initial encounter: Secondary | ICD-10-CM

## 2011-07-17 DIAGNOSIS — E0590 Thyrotoxicosis, unspecified without thyrotoxic crisis or storm: Secondary | ICD-10-CM | POA: Insufficient documentation

## 2011-07-17 DIAGNOSIS — F4310 Post-traumatic stress disorder, unspecified: Secondary | ICD-10-CM | POA: Insufficient documentation

## 2011-07-17 DIAGNOSIS — W19XXXA Unspecified fall, initial encounter: Secondary | ICD-10-CM | POA: Insufficient documentation

## 2011-07-17 MED ORDER — OXYCODONE-ACETAMINOPHEN 5-325 MG PO TABS
1.0000 | ORAL_TABLET | Freq: Once | ORAL | Status: AC
  Administered 2011-07-17: 1 via ORAL
  Filled 2011-07-17: qty 1

## 2011-07-17 MED ORDER — OXYCODONE-ACETAMINOPHEN 5-325 MG PO TABS: 1.0000 | ORAL_TABLET | Freq: Four times a day (QID) | ORAL | Status: AC | PRN

## 2011-07-17 NOTE — ED Provider Notes (Signed)
History     CSN: [id number] Arrival date & time: 07/17/2011  1:22 PM  Chief Complaint  Patient presents with  . Neck Injury   Patient is a 50 y.o. female presenting with neck injury. The history is provided by the patient.  Neck Injury Pertinent negatives include no chest pain, no abdominal pain, no headaches and no shortness of breath.  patient states that she has had 3 falls in the last 3 days due to her left knee giving out. She states that she has neck pain because of the falls. Previous neck surgery. She states that the back of her neck tingles. No upper extremity numbness or weakness. She states that she has had numbness in her left foot for a while. It didn't start with the fall. She states that she has had some back pain since the falls. No urinary symptoms.   Past Medical History  Diagnosis Date  . PTSD (post-traumatic stress disorder)   . Bipolar 1 disorder   . Heart murmur   . Hyperthyroidism   . Diverticulitis   . Keratosis, actinic     Past Surgical History  Procedure Date  . External ear surgery   . Neck surgery     Family History  Problem Relation Age of Onset  . Diabetes Mother   . Hypertension Mother   . Cancer Father   . Hypertension Father   . Hypertension Sister   . Heart attack Brother     History  Substance Use Topics  . Smoking status: Current Everyday Smoker -- 0.5 packs/day  . Smokeless tobacco: Not on file  . Alcohol Use: No    OB History    Grav Para Term Preterm Abortions TAB SAB Ect Mult Living   2 1 1  1  1   1      Patient's Medications  New Prescriptions   No medications on file  Previous Medications   ALBUTEROL (PROVENTIL) (2.5 MG/3ML) 0.083% NEBULIZER SOLUTION    Take 2.5 mg by nebulization every 6 (six) hours as needed. For shortness of  breath   CLONAZEPAM (KLONOPIN) 0.5 MG TABLET    Take 0.5 mg by mouth 3 (three) times daily as needed. For pain   DEXLANSOPRAZOLE (DEXILANT) 60 MG CAPSULE    Take 60 mg by mouth daily.     ESCITALOPRAM (LEXAPRO) 10 MG TABLET    Take 10 mg by mouth daily.     LAMOTRIGINE (LAMICTAL) 100 MG TABLET    Take 50 mg by mouth 2 (two) times daily.    LEVOTHYROXINE (SYNTHROID, LEVOTHROID) 50 MCG TABLET    Take 50 mcg by mouth daily.     LITHIUM CARBONATE 300 MG CAPSULE    Take 300 mg by mouth 2 (two) times daily with a meal.     TRAZODONE (DESYREL) 100 MG TABLET    Take 100 mg by mouth at bedtime.     TRAZODONE (DESYREL) 150 MG TABLET    Take 150 mg by mouth at bedtime.    Modified Medications   No medications on file  Discontinued Medications   No medications on file   Review of Systems  Constitutional: Negative for activity change and appetite change.  HENT: Negative for neck stiffness.   Eyes: Negative for pain.  Respiratory: Negative for chest tightness and shortness of breath.   Cardiovascular: Negative for chest pain and leg swelling.  Gastrointestinal: Negative for nausea, vomiting, abdominal pain and diarrhea.  Genitourinary: Negative for flank pain.  Musculoskeletal: Positive for  back pain.  Skin: Negative for rash.  Neurological: Positive for numbness. Negative for weakness and headaches.  Psychiatric/Behavioral: Negative for behavioral problems.    Physical Exam  BP 134/81  Pulse 96  Temp(Src) 98.2 F (36.8 C) (Oral)  Resp 20  SpO2 100%  Physical Exam  Nursing note and vitals reviewed. Constitutional: She is oriented to person, place, and time. She appears well-developed and well-nourished.  HENT:  Head: Normocephalic and atraumatic.  Eyes: EOM are normal. Pupils are equal, round, and reactive to light.  Neck: Normal range of motion. Neck supple.  Cardiovascular: Normal rate, regular rhythm and normal heart sounds.   No murmur heard. Pulmonary/Chest: Effort normal and breath sounds normal. No respiratory distress. She has no wheezes. She has no rales.  Abdominal: Soft. Bowel sounds are normal. She exhibits no distension. There is no tenderness. There is no  rebound and no guarding.  Musculoskeletal:       Lumbar spine mild tenderness without deformity. Lower cervical spine mild tenderness without stepoff or deformity. Left knee ROM intact and stable, but mild pain with movement. Left foot decreased sensation over ball of foot. Strength intact.   Neurological: She is alert and oriented to person, place, and time. No cranial nerve deficit.  Skin: Skin is warm and dry.  Psychiatric: She has a normal mood and affect. Her speech is normal.   Results for orders placed in visit on 06/24/11  HM MAMMOGRAPHY      Component Value Range   HM Mammogram ....    HM PAP SMEAR      Component Value Range   HM Pap smear eithelial cell abn    HM COLONOSCOPY      Component Value Range   HM Colonoscopy ..     Dg Cervical Spine Complete  07/17/2011  *RADIOLOGY REPORT*  Clinical Data: Neck pain, multiple falls, prior neck surgery  CERVICAL SPINE - COMPLETE 4+ VIEW  Comparison: None  Findings: Prior anterior fusion C4-C5. Minimal degenerative disc disease changes C3-C4, C5-C6, C6-C7. Bilateral mild narrowing of the neural foramina at C4-C5 by uncovertebral hypertrophy. Vertebral body heights maintained without fracture or subluxation. C1-C2 alignment normal. Lung apices clear. Prevertebral soft tissues normal thickness.  IMPRESSION: No definite acute cervical spine abnormalities identified. Prior anterior fusion C4-C5 with minimal degenerative disc disease changes.  Original Report Authenticated By: Lollie Marrow, M.D.   Dg Lumbar Spine Complete  07/17/2011  *RADIOLOGY REPORT*  Clinical Data: Multiple falls, low back pain  LUMBAR SPINE - COMPLETE 4+ VIEW  Comparison: None  Findings: Five non-rib bearing lumbar vertebrae. Vertebral body and disc space heights maintained. Tiny superior endplate spurs L4 and L5. No acute fracture, subluxation or bone destruction. No spondylolysis. SI joints symmetric.  IMPRESSION: No acute osseous abnormalities.  Original Report  Authenticated By: Lollie Marrow, M.D.   Dg Knee Complete 4 Views Left  07/17/2011  *RADIOLOGY REPORT*  Clinical Data:  Left knee pain post fall  LEFT KNEE - COMPLETE 4+ VIEW  Comparison: None  Findings: Bone mineralization normal. Joint spaces preserved. No fracture, dislocation, or bone destruction. No joint effusion.  IMPRESSION: No acute osseous abnormalities.  Original Report Authenticated By: Lollie Marrow, M.D.    ED Course  Procedures  MDM Neck pain after falls. Also knee pain and numbness in foot. xrays no fracture. Discharged.        Juliet Rude. Rubin Payor, MD 07/17/11 [phone]

## 2011-07-17 NOTE — ED Notes (Signed)
Pt c/o neck pain after falling several times over the past 3 days. H/s neck surgery.

## 2011-07-17 NOTE — ED Notes (Signed)
Patient with no complaints at this time. Respirations even and unlabored. Skin warm/dry. Discharge instructions reviewed with patient at this time. Patient given opportunity to voice concerns/ask questions. Patient discharged at this time and left Emergency Department with steady gait.   

## 2011-08-20 ENCOUNTER — Encounter (HOSPITAL_COMMUNITY): Payer: Self-pay | Admitting: Emergency Medicine

## 2011-08-20 ENCOUNTER — Emergency Department (HOSPITAL_COMMUNITY): Payer: Medicaid Other

## 2011-08-20 ENCOUNTER — Emergency Department (HOSPITAL_COMMUNITY)
Admission: EM | Admit: 2011-08-20 | Discharge: 2011-08-20 | Disposition: A | Discharge status: Home / Self Care | Payer: Medicaid Other | Attending: Emergency Medicine | Admitting: Emergency Medicine

## 2011-08-20 DIAGNOSIS — E0590 Thyrotoxicosis, unspecified without thyrotoxic crisis or storm: Secondary | ICD-10-CM | POA: Insufficient documentation

## 2011-08-20 DIAGNOSIS — R109 Unspecified abdominal pain: Secondary | ICD-10-CM | POA: Insufficient documentation

## 2011-08-20 DIAGNOSIS — J9801 Acute bronchospasm: Secondary | ICD-10-CM | POA: Insufficient documentation

## 2011-08-20 DIAGNOSIS — F17200 Nicotine dependence, unspecified, uncomplicated: Secondary | ICD-10-CM | POA: Insufficient documentation

## 2011-08-20 DIAGNOSIS — R1110 Vomiting, unspecified: Secondary | ICD-10-CM | POA: Insufficient documentation

## 2011-08-20 DIAGNOSIS — F319 Bipolar disorder, unspecified: Secondary | ICD-10-CM | POA: Insufficient documentation

## 2011-08-20 MED ORDER — ALBUTEROL SULFATE HFA 108 (90 BASE) MCG/ACT IN AERS
2.0000 | INHALATION_SPRAY | Freq: Once | RESPIRATORY_TRACT | Status: AC
  Administered 2011-08-20: 2 via RESPIRATORY_TRACT
  Filled 2011-08-20: qty 6.7

## 2011-08-20 MED ORDER — ALBUTEROL SULFATE (5 MG/ML) 0.5% IN NEBU
2.5000 mg | INHALATION_SOLUTION | Freq: Once | RESPIRATORY_TRACT | Status: AC
  Administered 2011-08-20: 2.5 mg via RESPIRATORY_TRACT
  Filled 2011-08-20: qty 0.5

## 2011-08-20 MED ORDER — IPRATROPIUM BROMIDE 0.02 % IN SOLN
0.5000 mg | Freq: Once | RESPIRATORY_TRACT | Status: AC
  Administered 2011-08-20: 0.5 mg via RESPIRATORY_TRACT
  Filled 2011-08-20: qty 2.5

## 2011-08-20 MED ORDER — PREDNISONE 20 MG PO TABS
60.0000 mg | ORAL_TABLET | Freq: Once | ORAL | Status: AC
  Administered 2011-08-20: 60 mg via ORAL
  Filled 2011-08-20: qty 3

## 2011-08-20 MED ORDER — PREDNISONE 10 MG PO TABS: 20.0000 mg | ORAL_TABLET | Freq: Every day | ORAL | Status: AC

## 2011-08-20 NOTE — ED Notes (Signed)
Pt c/o mid epigastric "burning" sensation with cough congestion x 2 weeks.

## 2011-08-20 NOTE — ED Provider Notes (Signed)
History     CSN: [id number] Arrival date & time: 08/20/2011  3:26 PM  Chief Complaint  Patient presents with  . Cough  . Abdominal Pain  . Emesis    (Consider location/radiation/quality/duration/timing/severity/associated sxs/prior treatment) Patient is a 50 y.o. female presenting with cough, abdominal pain, and vomiting. The history is provided by the patient.  Cough  Abdominal Pain The primary symptoms of the illness include abdominal pain and vomiting.  Emesis  Associated symptoms include abdominal pain and cough.   patient complains of cough and congestion for the last 3 days. History of COPD continues to smoke cigarettes. Cough has been nonproductive without associated fever. Denies chest pain no recent lower extremity swelling or travel history. Has been using her albuterol inhaler without relief of her symptoms. Called her doctor was told to come here. The patient denies exertional component to her symptoms no diaphoresis severe headaches or abdominal pain.  Past Medical History  Diagnosis Date  . PTSD (post-traumatic stress disorder)   . Bipolar 1 disorder   . Heart murmur   . Hyperthyroidism   . Diverticulitis   . Keratosis, actinic     Past Surgical History  Procedure Date  . External ear surgery   . Neck surgery     Family History  Problem Relation Age of Onset  . Diabetes Mother   . Hypertension Mother   . Cancer Father   . Hypertension Father   . Hypertension Sister   . Heart attack Brother     History  Substance Use Topics  . Smoking status: Current Everyday Smoker -- 0.5 packs/day  . Smokeless tobacco: Not on file  . Alcohol Use: No    OB History    Grav Para Term Preterm Abortions TAB SAB Ect Mult Living   2 1 1  1  1   1       Review of Systems  Respiratory: Positive for cough.   Gastrointestinal: Positive for vomiting and abdominal pain.  All other systems reviewed and are negative.    Allergies  Review of patient's allergies  indicates no known allergies.  Home Medications   Current Outpatient Rx  Name Route Sig Dispense Refill  . ALBUTEROL SULFATE HFA 108 (90 BASE) MCG/ACT IN AERS Inhalation Inhale 2 puffs into the lungs every 6 (six) hours as needed. For shortness of breath     . ALBUTEROL SULFATE (2.5 MG/3ML) 0.083% IN NEBU Nebulization Take 2.5 mg by nebulization every 6 (six) hours as needed. For shortness of  breath    . CLONAZEPAM 0.5 MG PO TABS Oral Take 0.5 mg by mouth 3 (three) times daily as needed. For pain    . DEXLANSOPRAZOLE 60 MG PO CPDR Oral Take 60 mg by mouth daily.      Marland Kitchen LAMOTRIGINE 100 MG PO TABS Oral Take 50 mg by mouth 2 (two) times daily.     Marland Kitchen LEVOTHYROXINE SODIUM 50 MCG PO TABS Oral Take 50 mcg by mouth daily.      Marland Kitchen LITHIUM CARBONATE 300 MG PO CAPS Oral Take 900 mg by mouth 3 (three) times daily with meals.     . TRAZODONE HCL 100 MG PO TABS Oral Take 100 mg by mouth at bedtime.      Marland Kitchen ESCITALOPRAM OXALATE 10 MG PO TABS Oral Take 10 mg by mouth daily.      . TRAZODONE HCL 150 MG PO TABS Oral Take 150 mg by mouth at bedtime.        BP  116/76  Pulse 79  Temp(Src) 98.7 F (37.1 C) (Oral)  Resp 20  Ht 5\' 6"  (1.676 m)  Wt 171 lb (77.565 kg)  BMI 27.60 kg/m2  SpO2 100%  Physical Exam  Nursing note and vitals reviewed. Constitutional: She is oriented to person, place, and time. Vital signs are normal. She appears well-developed and well-nourished.  Non-toxic appearance. No distress.  HENT:  Head: Normocephalic and atraumatic.  Eyes: Conjunctivae and EOM are normal. Pupils are equal, round, and reactive to light.  Neck: Normal range of motion. Neck supple. No tracheal deviation present.  Cardiovascular: Normal rate, regular rhythm and normal heart sounds.  Exam reveals no gallop.   No murmur heard. Pulmonary/Chest: Effort normal. No stridor. Not tachypneic. No respiratory distress. She has decreased breath sounds. She has wheezes. She has no rhonchi. She has no rales.  Abdominal:  Soft. Normal appearance and bowel sounds are normal. She exhibits no distension. There is no tenderness. There is no rebound.  Musculoskeletal: Normal range of motion. She exhibits no edema and no tenderness.  Neurological: She is alert and oriented to person, place, and time. She has normal strength. No cranial nerve deficit or sensory deficit. GCS eye subscore is 4. GCS verbal subscore is 5. GCS motor subscore is 6.  Skin: Skin is warm and dry.  Psychiatric: She has a normal mood and affect. Her speech is normal and behavior is normal.    ED Course  Procedures (including critical care time)  Labs Reviewed - No data to display No results found.   No diagnosis found.    MDM  Patient given albuterol and Atrovent as well as prednisone. Patient rechecked and now feels better will discharge to home on prednisone and patient told to take her albuterol inhaler at home        Toy Baker, MD 08/20/11 [id number]

## 2011-08-20 NOTE — ED Notes (Signed)
Pt states breathing tx helped but it did make her sick on her stomach. Pt req food. Pt made aware she cannot have food at present

## 2011-08-25 ENCOUNTER — Ambulatory Visit (HOSPITAL_COMMUNITY)
Admission: RE | Admit: 2011-08-25 | Discharge: 2011-08-25 | Disposition: A | Discharge status: Home / Self Care | Payer: Medicaid Other | Source: Ambulatory Visit | Attending: Family Medicine | Admitting: Family Medicine

## 2011-08-25 DIAGNOSIS — Z139 Encounter for screening, unspecified: Secondary | ICD-10-CM

## 2011-08-25 DIAGNOSIS — Z1231 Encounter for screening mammogram for malignant neoplasm of breast: Secondary | ICD-10-CM | POA: Insufficient documentation

## 2011-08-27 LAB — COMPREHENSIVE METABOLIC PANEL
BUN: 7 mg/dL (ref 4–21)
CO2: 25 mmol/L
Calcium: 9.9 mg/dL
Chloride: 102 mmol/L
Creat: 1.06
Glucose: 116 mg/dL
Lipase: 12 units/L (ref 0–53)
Lithium Lvl: 1.14
TSH: 2.26 u[IU]/mL (ref 0.41–5.90)

## 2011-08-27 LAB — HELICOBACTER PYLORI ABS-IGG+IGA, BLD: H Pylori IgG: 0.4

## 2011-09-01 ENCOUNTER — Other Ambulatory Visit: Payer: Self-pay | Admitting: Family Medicine

## 2011-09-03 ENCOUNTER — Encounter (HOSPITAL_COMMUNITY)
Admission: RE | Admit: 2011-09-03 | Discharge: 2011-09-03 | Disposition: A | Discharge status: Home / Self Care | Payer: Medicaid Other | Source: Ambulatory Visit | Attending: Family Medicine | Admitting: Family Medicine

## 2011-09-03 DIAGNOSIS — R1013 Epigastric pain: Secondary | ICD-10-CM | POA: Insufficient documentation

## 2011-09-03 DIAGNOSIS — R112 Nausea with vomiting, unspecified: Secondary | ICD-10-CM | POA: Insufficient documentation

## 2011-09-03 DIAGNOSIS — K3189 Other diseases of stomach and duodenum: Secondary | ICD-10-CM | POA: Insufficient documentation

## 2011-09-03 DIAGNOSIS — R197 Diarrhea, unspecified: Secondary | ICD-10-CM | POA: Insufficient documentation

## 2011-09-03 MED ORDER — TECHNETIUM TC 99M SULFUR COLLOID
2.0000 | Freq: Once | INTRAVENOUS | Status: AC | PRN
  Administered 2011-09-03: 2 via ORAL

## 2011-09-18 ENCOUNTER — Encounter: Payer: Self-pay | Admitting: Urgent Care

## 2011-09-18 ENCOUNTER — Telehealth: Payer: Self-pay | Admitting: Urgent Care

## 2011-09-18 ENCOUNTER — Ambulatory Visit (INDEPENDENT_AMBULATORY_CARE_PROVIDER_SITE_OTHER): Payer: Medicaid Other | Admitting: Urgent Care

## 2011-09-18 VITALS — BP 119/77 | HR 78 | Temp 98.6°F | Ht 66.0 in | Wt 177.6 lb

## 2011-09-18 DIAGNOSIS — K3184 Gastroparesis: Secondary | ICD-10-CM | POA: Insufficient documentation

## 2011-09-18 DIAGNOSIS — Z800 Family history of malignant neoplasm of digestive organs: Secondary | ICD-10-CM

## 2011-09-18 DIAGNOSIS — R197 Diarrhea, unspecified: Secondary | ICD-10-CM

## 2011-09-18 DIAGNOSIS — D126 Benign neoplasm of colon, unspecified: Secondary | ICD-10-CM | POA: Insufficient documentation

## 2011-09-18 DIAGNOSIS — F1910 Other psychoactive substance abuse, uncomplicated: Secondary | ICD-10-CM

## 2011-09-18 DIAGNOSIS — K529 Noninfective gastroenteritis and colitis, unspecified: Secondary | ICD-10-CM

## 2011-09-18 DIAGNOSIS — K219 Gastro-esophageal reflux disease without esophagitis: Secondary | ICD-10-CM

## 2011-09-18 LAB — CBC WITH DIFFERENTIAL/PLATELET
Hemoglobin: 14.3 g/dL (ref 12.0–16.0)
WBC: 10.8

## 2011-09-18 NOTE — Assessment & Plan Note (Signed)
Treatment of her gastroparesis will be challenging given illicit drug use and multiple psychoactive medications. Marijuana could be contributing to her symptoms. We are going to be limited to to short-term use with Sheryl Hall.  Decrease your REGLAN to 5mg  (1/2 tablet) before breakfast, lunch, dinner Try gastroparesis diet No marijuana or illicit drugs. Office visit in 6 weeks w/ Dr Darrick Penna

## 2011-09-18 NOTE — Assessment & Plan Note (Signed)
Well controlled on Dexilant 60mg daily  

## 2011-09-18 NOTE — Assessment & Plan Note (Signed)
Sheryl Hall is a 50 y.o. female history of tubular adenoma. Initially she was due for colonoscopy in 2018, however she reports family history of colon cancer in her father, so she will need colonoscopy in 2016.

## 2011-09-18 NOTE — Progress Notes (Addendum)
Referring Provider: Leo Grosser, MD Primary Care Physician:  Leo Grosser, MD, MD Primary Gastroenterologist:  Dr. Jena Gauss  Chief Complaint  Patient presents with  . Diarrhea  . Emesis    not now   HPI:  Sheryl Hall is a 50 y.o. female here as a referral from Dr. Tanya Nones for diarrhea & gastroparesis.  She has hx IBS & was presumed to have gastroparesis, however due to illicit drug use & multiple psychoactive medications, there were very little options in the way of treating her gastroparesis.  She has been by Dr. Jena Gauss and myself last year. Last yr, she had a CT scan abdomen and pelvis which was negative and negative celiac panel.  Colonoscopy/EGD was benign as below.  Daily diarrhea 4-5 loose large volume stools per day.  Denies rectal bleeding, mucus, or melena.  C/o LLQ pain intermittent.  +abd bloating.  Previously bloating made worse by any anti-spasmodic. +Polyuria.  Wt up 11# in past year.  Appetite ok.  No new medications, no foreign travel, or ill contacts.  GERD well-controlled on Dexilant 60mg  daily.  Nausea is much better on reglan 10mg  before each meal for 3 weeks.    GES 09/03/11->Delayed gastric emptying; 51% of radiotracer activity within the confines of the stomach at 120 minutes (normal less than 30%). Labs reviewed from Dr. Caren Macadam office H. pylori negative, WBC 10.8, MCV 103.7, hemoglobin, hematocrit and platelets are normal, Sedimentation rate normal.  CMP normal except glucose 116. Lipase and TSH were normal.   Past Medical History  Diagnosis Date  . PTSD (post-traumatic stress disorder)   . Bipolar 1 disorder   . Heart murmur   . Hyperthyroidism   . Diverticulosis of colon   . Keratosis, actinic   . Family hx of colon cancer     age 30-father  . GERD (gastroesophageal reflux disease)   . IBS (irritable bowel syndrome)   . Chronic nausea   . S/P endoscopy 05/30/2010    Dr Rourk-> non--critical Schatzki's ring, s/p 55F dilation  . S/P colonoscopy  05/30/2010    LAX sphincter tone, anal papilla, left-sided diverticulosis, normal random biopsies,, 1 polyp-TA  . Polysubstance abuse   . Anxiety   . Depression   . Tubular adenoma of colon 05/30/2010    Next colonoscopy 05/2015    Past Surgical History  Procedure Date  . External ear surgery   . Neck surgery     Current Outpatient Prescriptions  Medication Sig Dispense Refill  . albuterol (PROVENTIL HFA;VENTOLIN HFA) 108 (90 BASE) MCG/ACT inhaler Inhale 2 puffs into the lungs every 6 (six) hours as needed. For shortness of breath       . albuterol (PROVENTIL) (2.5 MG/3ML) 0.083% nebulizer solution Take 2.5 mg by nebulization every 6 (six) hours as needed. For shortness of  breath      . clonazePAM (KLONOPIN) 0.5 MG tablet Take 0.5 mg by mouth 3 (three) times daily as needed. For pain      . dexlansoprazole (DEXILANT) 60 MG capsule Take 60 mg by mouth daily.        Marland Kitchen lamoTRIgine (LAMICTAL) 100 MG tablet Take 50 mg by mouth 2 (two) times daily.       Marland Kitchen levothyroxine (SYNTHROID, LEVOTHROID) 50 MCG tablet Take 50 mcg by mouth daily.        Marland Kitchen lithium carbonate 300 MG capsule Take 900 mg by mouth 3 (three) times daily with meals.       . metoCLOPramide (REGLAN) 10 MG  tablet Take 10 mg by mouth 3 (three) times daily.        . traZODone (DESYREL) 100 MG tablet Take 100 mg by mouth at bedtime.          Allergies as of 09/18/2011  . (No Known Allergies)   Family History  Problem Relation Age of Onset  . Diabetes Mother   . Hypertension Mother   . Colon cancer Father 84  . Hypertension Father   . Hypertension Sister   . Heart attack Brother    History   Social History  . Marital Status: Divorced    Spouse Name: N/A    Number of Children: 1  . Years of Education: N/A   Occupational History  . disabled    Social History Main Topics  . Smoking status: Current Everyday Smoker -- 0.5 packs/day  . Smokeless tobacco: Not on file  . Alcohol Use: No  . Drug Use: Yes     hx cocaine  abuse, last time last, still uses marijuana 4 times per week,   . Sexually Active: No   Other Topics Concern  . Not on file   Social History Narrative   Lives w/ fiance  Review of Systems: Gen: Denies any fever, chills, sweats, anorexia, fatigue, weakness, malaise, weight loss, and sleep disorder CV: Denies chest pain, angina, palpitations, syncope, orthopnea, PND, peripheral edema, and claudication. Resp: Denies dyspnea at rest, dyspnea with exercise, cough, sputum, wheezing, coughing up blood, and pleurisy. GI: Denies vomiting blood, jaundice, and fecal incontinence.   Denies dysphagia or odynophagia. GU : Denies urinary burning, blood in urine, urinary frequency, urinary hesitancy, nocturnal urination, and urinary incontinence. MS: Denies joint pain, limitation of movement, and swelling, stiffness, low back pain, extremity pain. Denies muscle weakness, cramps, atrophy.  Derm: Denies rash, itching, dry skin, hives, moles, warts, or unhealing ulcers.  Psych: Hx bipolar, anxiety, depression on medications Heme: Denies bruising, bleeding, and enlarged lymph nodes.  Physical Exam: BP 119/77  Pulse 78  Temp(Src) 98.6 F (37 C) (Temporal)  Ht 5\' 6"  (1.676 m)  Wt 177 lb 9.6 oz (80.559 kg)  BMI 28.67 kg/m2 General:   Alert,  Well-developed, well-nourished, pleasant and cooperative in NAD. Head:  Normocephalic and atraumatic. Eyes:  Sclera clear, no icterus.   Conjunctiva pink. Ears:  Normal auditory acuity. Nose:  No deformity, discharge,  OP pink/moist. Mouth:  No deformity or lesions, dentition normal. Neck:  Supple; no masses or thyromegaly. Lungs:  Clear throughout to auscultation.   No wheezes, crackles, or rhonchi. No acute distress. Heart:  Regular rate and rhythm; no murmurs, clicks, rubs,  or gallops. Abdomen:  +umbilical ornament.  Soft, nontender and nondistended. No masses, hepatosplenomegaly or hernias noted. Normal bowel sounds, without guarding, and without rebound.     Rectal:  Deferred. Msk:  Symmetrical without gross deformities. Normal posture. Pulses:  Normal pulses noted. Extremities:  Without clubbing or edema. Neurologic:  Alert and  oriented x4;  grossly normal neurologically. Skin:  Intact without significant lesions or rashes. Cervical Nodes:  No significant cervical adenopathy. Psych:  Alert and cooperative. Normal mood and affect.

## 2011-09-18 NOTE — Assessment & Plan Note (Addendum)
Sheryl Hall was encouraged to stop using marijuana or other illicit drugs. She agrees.

## 2011-09-18 NOTE — Patient Instructions (Signed)
Stop street drugs including marijuana  Begin ALIGN once daily Return your stools to the lab ASAP Decrease your REGLAN to 5mg  (1/2 tablet) before breakfast, lunch, dinner Try gastroparesis diet Office visit in 6 weeks w/ Dr Darrick Penna Gastroparesis  Gastroparesis is also called slowed stomach emptying (delayed gastric emptying). It is a condition in which the stomach takes too long to empty its contents.  CAUSES Gastroparesis happens when nerves to the stomach are damaged or stop working. When the nerves are damaged, the muscles of the stomach and intestines do not work normally. Then, the movement of food is slowed or stopped.   RISK FACTORS  Diabetes.   Post-viral syndromes.   Eating disorders (anorexia or bulimia).   Surgery on the stomach or vagus nerve.   Gastroesophageal reflux disease (rarely).   Smooth muscle disorders such as amyloidosis and scleroderma.   Metabolic disorders, including hypothyroidism.   Parkinson's disease.  SYMPTOMS  Heartburn.   Feeling sick to your stomach (nausea).   Vomiting of undigested food.   An early feeling of fullness when eating.   Weight loss.   Belly (abdominal) bloating.   Erratic blood glucose levels.   Lack of appetite.   Gastroesophageal reflux.   Spasms of the stomach wall.  COMPLICATIONS  Bacterial overgrowth in stomach. Food stays in the stomach and can ferment and cause germs (bacteria) to grow.   Weight loss. You have difficulty digesting and absorbing nutrients.   Vomiting.   Obstruction in the stomach. Undigested food can harden and cause nausea and vomiting.   Blood glucose fluctuations caused by inconsistent food absorption.  DIAGNOSIS The diagnosis of gastroparesis is confirmed through one or more of the following tests:  Barium X-rays and scans. These tests look at how long it takes for food to move through the stomach.   Gastric manometry. This test measures electrical and muscular activity in the  stomach. A thin tube is passed down the throat into the stomach. The tube contains a wire that takes measurements of the stomach's electrical and muscular activity as it digests liquids and solid food.   An endoscopy is a procedure done with a long, thin tube called an endoscope. It is passed through the mouth and gently guides it down the esophagus into the stomach. This tubes helps the caregiver look at the lining of the stomach to check for any abnormalities.   Ultrasound. This can rule out gallbladder disease or pancreatitis. This test will outline and define the shape of the gallbladder and pancreas.  TREATMENT  Exercise.   Medications to control nausea and vomiting.   Medications to stimulate stomach muscles.   Changes in what and when you eat.   Having 4-6 smaller meals daily  Eating low fiber forms of high fiber foods (i.e. cooked vegetables instead of raw).   Eating low fat foods.   Liquids are easier to digest.   In severe cases, feeding tubes and intravenous feeding may be needed.   It is important to note that in most cases, treatment does not cure gastroparesis. It is usually a lasting (chronic) condition. Treatment helps you manage the condition so that you can be as healthy and comfortable as possible.  NEW TREATMENTS  A gastric neurostimulator has been developed to assist people with gastroparesis. The battery-operated device is surgically implanted. It emits mild electrical pulses to help improve stomach emptying and to control nausea and vomiting.   The use of botulinum toxin has been shown to improve stomach emptying by  decreasing the prolonged contractions of the muscle between the stomach and the small intestine (pyloric sphincter). The benefits are temporary.  SEEK MEDICAL CARE IF:  You are having problems keeping your blood glucose in goal range.   You are having nausea, vomiting, bloating or feelings of fullness with eating.   Your symptoms do not change  with a change in diet.  Document Released: 11/10/2005 Document Re-Released: 09/07/2009 [facility] Patient Information 2011 [place], [place].

## 2011-09-18 NOTE — Progress Notes (Signed)
Cc to PCP 

## 2011-09-18 NOTE — Assessment & Plan Note (Addendum)
Colonoscopy and random biopsies benign last year. CT a/p benign. Celiac antibody panel negative.  Check for IgA deficiency as this was not checked last year. She has persistent diarrhea which I suspect is secondary to irritable bowel syndrome versus medication effect. Failed anti-spasmodic last year. Reglan for gastroparesis could make this worse.  Increase from baseline diarrhea and new leukocytosis warrants further evaluation with stool studies.  Trial of probiotics ALIGN daily

## 2011-09-18 NOTE — Telephone Encounter (Signed)
Please change the note reminder call in the computer regarding colonoscopy.  She is actually due July 2016 instead of 2018 re: family history cancer/ tubular adenoma  Thanks

## 2011-09-20 LAB — IGA: IgA: 106 mg/dL (ref 69–380)

## 2011-09-22 ENCOUNTER — Telehealth: Payer: Self-pay | Admitting: Gastroenterology

## 2011-09-22 NOTE — Telephone Encounter (Signed)
Ok, we are waiting for c diff. Will call when available.

## 2011-09-22 NOTE — Telephone Encounter (Signed)
Patient wants stool study results/and patient is still having diarrhea and nausea/please advise??

## 2011-09-22 NOTE — Telephone Encounter (Signed)
Reminder in epic to have tcs in 05/2015

## 2011-09-22 NOTE — Telephone Encounter (Signed)
Resume reglan 10mg  TID. Return stools ASAP.

## 2011-09-22 NOTE — Telephone Encounter (Signed)
LMOM to call.

## 2011-09-22 NOTE — Telephone Encounter (Signed)
Please call patient. Preliminary stool studies are normal. There is nothing to suggest celiac disease. Continue align daily. Recommendations as earlier today regarding Reglan. Office visit in 6 weeks with me.

## 2011-09-22 NOTE — Progress Notes (Signed)
Quick Note:  Please call patient. Preliminary stool studies are normal. There is nothing to suggest celiac disease. Continue align daily. Recommendations as earlier today regarding Reglan. Office visit in 6 weeks with me. ______

## 2011-09-22 NOTE — Progress Notes (Signed)
Pt d/c from practice-former RMR pt.

## 2011-09-22 NOTE — Telephone Encounter (Signed)
Called pt. Told her we are waiting on the stool study results. She said she is now taking Reglan 1/2 tablet three times daily and she is having more nausea. Also, some cramps in her left side. She has had 3 stools this morning and they were more just loose/ semi formed and not watery. Please advise!

## 2011-09-22 NOTE — Telephone Encounter (Signed)
Pt informed. She has turned in all of the stool samples.

## 2011-09-23 LAB — STOOL CULTURE

## 2011-09-23 NOTE — Telephone Encounter (Signed)
Pt informed

## 2011-09-25 ENCOUNTER — Telehealth: Payer: Self-pay | Admitting: Internal Medicine

## 2011-09-25 NOTE — Telephone Encounter (Signed)
Do I need to schedule pt  or has she been discharged from the practice? Please advise.

## 2011-09-25 NOTE — Telephone Encounter (Signed)
Pt needs FU OV w/ RMR only in 30 minute slot Thanks

## 2011-09-25 NOTE — Telephone Encounter (Signed)
Message copied by Ferne Reus on Thu Sep 25, 2011  4:45 PM ------      Message from: Tonye Pearson      Created: Wed Sep 24, 2011  2:22 PM       Called pt told her the stools was normal and we would call her for a follow up appointment for 6 wks. Can you please make her a follow up appointment. Thanks

## 2011-09-26 ENCOUNTER — Encounter: Payer: Self-pay | Admitting: Internal Medicine

## 2011-09-26 NOTE — Telephone Encounter (Signed)
Yes, please schedule her to see Dr. Jena Gauss.

## 2011-09-26 NOTE — Telephone Encounter (Signed)
Forwarded to [employer]

## 2011-09-26 NOTE — Telephone Encounter (Signed)
LMOM to set up OV for 1/3 at 3pm with RMR and appt card was mailed

## 2011-09-26 NOTE — Progress Notes (Signed)
Results Cc to PCP  

## 2011-09-26 NOTE — Progress Notes (Signed)
RMR next available will be 11/27/11.  Please advise about making a 6 wk follow up w/RMR

## 2011-09-26 NOTE — Telephone Encounter (Signed)
Pt needs appt. with Dr Jena Gauss

## 2011-10-21 ENCOUNTER — Ambulatory Visit (HOSPITAL_COMMUNITY)
Admission: RE | Admit: 2011-10-21 | Discharge: 2011-10-21 | Disposition: A | Discharge status: Home / Self Care | Payer: Medicaid Other | Source: Ambulatory Visit | Attending: Family Medicine | Admitting: Family Medicine

## 2011-10-21 ENCOUNTER — Other Ambulatory Visit: Payer: Self-pay | Admitting: Family Medicine

## 2011-10-21 ENCOUNTER — Encounter: Payer: Self-pay | Admitting: Internal Medicine

## 2011-10-21 DIAGNOSIS — M545 Low back pain, unspecified: Secondary | ICD-10-CM | POA: Insufficient documentation

## 2011-11-05 ENCOUNTER — Encounter: Payer: Self-pay | Admitting: Urgent Care

## 2011-11-05 ENCOUNTER — Ambulatory Visit: Payer: Medicaid Other | Admitting: Gastroenterology

## 2011-11-05 ENCOUNTER — Telehealth: Payer: Self-pay | Admitting: Gastroenterology

## 2011-11-05 ENCOUNTER — Ambulatory Visit (INDEPENDENT_AMBULATORY_CARE_PROVIDER_SITE_OTHER): Payer: Medicaid Other | Admitting: Urgent Care

## 2011-11-05 DIAGNOSIS — F1910 Other psychoactive substance abuse, uncomplicated: Secondary | ICD-10-CM

## 2011-11-05 DIAGNOSIS — K589 Irritable bowel syndrome without diarrhea: Secondary | ICD-10-CM | POA: Insufficient documentation

## 2011-11-05 DIAGNOSIS — K219 Gastro-esophageal reflux disease without esophagitis: Secondary | ICD-10-CM

## 2011-11-05 DIAGNOSIS — R197 Diarrhea, unspecified: Secondary | ICD-10-CM

## 2011-11-05 DIAGNOSIS — D126 Benign neoplasm of colon, unspecified: Secondary | ICD-10-CM

## 2011-11-05 DIAGNOSIS — K529 Noninfective gastroenteritis and colitis, unspecified: Secondary | ICD-10-CM

## 2011-11-05 DIAGNOSIS — R143 Flatulence: Secondary | ICD-10-CM

## 2011-11-05 DIAGNOSIS — R140 Abdominal distension (gaseous): Secondary | ICD-10-CM | POA: Insufficient documentation

## 2011-11-05 DIAGNOSIS — K3184 Gastroparesis: Secondary | ICD-10-CM

## 2011-11-05 DIAGNOSIS — R141 Gas pain: Secondary | ICD-10-CM

## 2011-11-05 NOTE — Assessment & Plan Note (Signed)
Persistent. Suspect secondary to Irritable bowel syndrome.  Offered antispasmotic, patient declined.  Hydrogen breath test to rule out small bowel bacterial overgrowth.

## 2011-11-05 NOTE — Assessment & Plan Note (Signed)
Continued Dexilant 60 mg daily

## 2011-11-05 NOTE — Assessment & Plan Note (Signed)
Report cessation.

## 2011-11-05 NOTE — Assessment & Plan Note (Signed)
Attempted weaning off Reglan, patient intolerant. She is going to continue low-dose Reglan she understands the risks of side effects and will call if any problems.

## 2011-11-05 NOTE — Assessment & Plan Note (Signed)
Continue digestive advantage

## 2011-11-05 NOTE — Telephone Encounter (Signed)
Called pt to give her procedure date and time - LMOM- HBT scheduled for 12/17 @ 7:30- instruction sheet was given to pt at time of her office visit

## 2011-11-05 NOTE — Patient Instructions (Signed)
Continue your current medications Be sure you get a probiotic daily Hydrogen breath test, I will call you with results

## 2011-11-05 NOTE — Progress Notes (Signed)
Primary Care Physician:  Leo Grosser, MD, MD Primary Gastroenterologist:  Dr. Jena Gauss  Chief Complaint  Patient presents with  . Abdominal Pain  . Bloated   HPI:  Sheryl Hall is a 50 y.o. female here to the IBS, GERD, diarrhea & gastroparesis.  Abdominal bloating all the time.  Reglan decreased to 5mg , nausea worse, increased back to 10mg .  Eats toast for breakfast, eats small meals all days.  Denies vomiting.  BM 7-8 loose stools per day.  Denies rectal bleeding.  No recent marijuana.  Weight steadily increasing.  Takes digestive advantage each AM.  Vitals - 1 value per visit 11/05/2011 09/18/2011 08/20/2011 07/17/2011  Weight (lb) 179.8 177.6 171    Vitals - 1 value per visit 07/08/2010 05/16/2010  Weight (lb) 166.5 169   GI workup: [facility] 09/03/11->Delayed gastric emptying; 51% of radiotracer activity within the confines of the stomach at 120 minutes (normal less than 30%). H. pylori negative, Sedimentation rate normal.  CMP normal except glucose 116. Lipase and TSH were normal.  2011 CT scan abdomen and pelvis which was negative, negative celiac panel.  Colonoscopy/EGD was benign as below. Past Medical History  Diagnosis Date  . PTSD (post-traumatic stress disorder)   . Bipolar 1 disorder   . Heart murmur   . Hyperthyroidism   . Diverticulosis of colon   . Keratosis, actinic   . Family hx of colon cancer     age 32-father  . GERD (gastroesophageal reflux disease)   . IBS (irritable bowel syndrome)   . Chronic nausea   . S/P endoscopy 05/30/2010    Dr Rourk-> non--critical Schatzki's ring, s/p 68F dilation  . S/P colonoscopy 05/30/2010    LAX sphincter tone, anal papilla, left-sided diverticulosis, normal random biopsies,, 1 polyp-TA  . Polysubstance abuse   . Anxiety   . Depression   . Tubular adenoma of colon 05/30/2010    Next colonoscopy 05/2015    Past Surgical History  Procedure Date  . External ear surgery   . Neck surgery     Current Outpatient Prescriptions    Medication Sig Dispense Refill  . albuterol (PROVENTIL HFA;VENTOLIN HFA) 108 (90 BASE) MCG/ACT inhaler Inhale 2 puffs into the lungs every 6 (six) hours as needed. For shortness of breath       . albuterol (PROVENTIL) (2.5 MG/3ML) 0.083% nebulizer solution Take 2.5 mg by nebulization every 6 (six) hours as needed. For shortness of  breath      . clonazePAM (KLONOPIN) 0.5 MG tablet Take 0.5 mg by mouth 3 (three) times daily as needed. For pain      . dexlansoprazole (DEXILANT) 60 MG capsule Take 60 mg by mouth daily.        Marland Kitchen lamoTRIgine (LAMICTAL) 100 MG tablet Take 50 mg by mouth 2 (two) times daily.       Marland Kitchen levothyroxine (SYNTHROID, LEVOTHROID) 50 MCG tablet Take 50 mcg by mouth daily.        Marland Kitchen lithium carbonate 300 MG capsule Take 900 mg by mouth 3 (three) times daily with meals.       . metoCLOPramide (REGLAN) 10 MG tablet Take 10 mg by mouth 3 (three) times daily.        . Probiotic Product (PROBIOTIC PO) Take by mouth.        . traZODone (DESYREL) 100 MG tablet Take 100 mg by mouth at bedtime.          Allergies as of 11/05/2011  . (No Known Allergies)  Review of Systems: Gen: Denies any fever, chills, sweats, anorexia, fatigue, weakness, malaise, weight loss, and sleep disorder CV: Denies chest pain, angina, palpitations, syncope, orthopnea, PND, peripheral edema, and claudication. Resp: Denies dyspnea at rest, dyspnea with exercise, cough, sputum, wheezing, coughing up blood, and pleurisy. GI: Denies vomiting blood, jaundice, and fecal incontinence.   Denies dysphagia or odynophagia. GU : Denies urinary burning, blood in urine, urinary frequency, urinary hesitancy, nocturnal urination, and urinary incontinence. MS: Denies joint pain, limitation of movement, and swelling, stiffness, low back pain, extremity pain. Denies muscle weakness, cramps, atrophy.  Derm: Denies rash, itching, dry skin, hives, moles, warts, or unhealing ulcers.  Psych: Hx bipolar, anxiety, depression on  medications Heme: Denies bruising, bleeding, and enlarged lymph nodes.  Physical Exam: BP 109/76  Pulse 69  Temp(Src) 97.6 F (36.4 C) (Temporal)  Ht 5\' 6"  (1.676 m)  Wt 179 lb 12.8 oz (81.557 kg)  BMI 29.02 kg/m2 General:   Alert,  Well-developed, well-nourished, pleasant and cooperative in NAD. Head:  Normocephalic and atraumatic. Eyes:  Sclera clear, no icterus.   Conjunctiva pink. Ears:  Normal auditory acuity. Nose:  No deformity, discharge,  OP pink/moist. Mouth:  No deformity or lesions, dentition normal. Neck:  Supple; no masses or thyromegaly. Lungs:  Clear throughout to auscultation.   No wheezes, crackles, or rhonchi. No acute distress. Heart:  Regular rate and rhythm; no murmurs, clicks, rubs,  or gallops. Abdomen:  +umbilical ornament.  Soft, nontender and nondistended. No masses, hepatosplenomegaly or hernias noted. Normal bowel sounds, without guarding, and without rebound.   Rectal:  Deferred. Msk:  Symmetrical without gross deformities. Normal posture. Pulses:  Normal pulses noted. Extremities:  Without clubbing or edema. Neurologic:  Alert and  oriented x4;  grossly normal neurologically. Skin:  Intact without significant lesions or rashes. Cervical Nodes:  No significant cervical adenopathy. Psych:  Alert and cooperative. Normal mood and affect.

## 2011-11-06 ENCOUNTER — Encounter (HOSPITAL_COMMUNITY): Payer: Self-pay | Admitting: Pharmacy Technician

## 2011-11-06 NOTE — Progress Notes (Signed)
Cc to PCP 

## 2011-11-10 ENCOUNTER — Ambulatory Visit (HOSPITAL_COMMUNITY): Admission: RE | Admit: 2011-11-10 | Payer: Medicaid Other | Source: Ambulatory Visit | Admitting: Internal Medicine

## 2011-11-10 ENCOUNTER — Telehealth: Payer: Self-pay | Admitting: Internal Medicine

## 2011-11-10 ENCOUNTER — Encounter (HOSPITAL_COMMUNITY): Admission: RE | Payer: Self-pay | Source: Ambulatory Visit

## 2011-11-10 SURGERY — HYDROGEN BREATH TEST
Anesthesia: LOCAL

## 2011-11-10 NOTE — Telephone Encounter (Signed)
Pt had LMOM on Saturday with concerns about her taking an antibiotic and having a fever. She has a Hydrogen Breath Test scheduled for today and doesn't know if she should [facility]. I transferred call to CD's VM not knowing that she would not be here today. Please call patient at [phone]

## 2011-11-27 ENCOUNTER — Ambulatory Visit: Payer: Medicaid Other | Admitting: Internal Medicine

## 2011-11-28 ENCOUNTER — Ambulatory Visit (INDEPENDENT_AMBULATORY_CARE_PROVIDER_SITE_OTHER): Payer: Medicaid Other | Admitting: Internal Medicine

## 2011-11-28 ENCOUNTER — Encounter: Payer: Self-pay | Admitting: Internal Medicine

## 2011-11-28 VITALS — BP 115/75 | HR 73 | Temp 98.1°F | Ht 65.0 in | Wt 178.6 lb

## 2011-11-28 DIAGNOSIS — R197 Diarrhea, unspecified: Secondary | ICD-10-CM

## 2011-11-28 NOTE — Patient Instructions (Addendum)
Proceed with a hydrogen breath   Take one imodium at bedtime and each morning  Further recommendations to follow  Stool sample for occult blood

## 2011-11-28 NOTE — Assessment & Plan Note (Signed)
Chronic diarrhea with periods of significant constipation mitigates enhanced any significant organic process and points toward steerable bowel syndrome. However, we like to rule out bacterial overgrowth as a contributing factor. She certainly could have GI dysmotility in her small bowel given that she all ready has gastroparesis.  She is up-to-date on colonoscopy.  Recommendations: Proceed with a hydrogen breath test. Take one Imodium at bedtime and one in the morning to try and preempt bouts of diarrhea.  We'll also send a stool for occult blood testing. Otherwise, she will not be due for another colonoscopy until 2016.

## 2011-11-28 NOTE — Progress Notes (Signed)
Primary Care Physician:  Leo Grosser, MD, MD Primary Gastroenterologist:  Dr.   Pre-Procedure History & Physical: HPI:  Sheryl Hall is a 51 y.o. female here for followup of abdominal bloating and diarrhea. Continues to have intermittent diarrhea but may get one or 2 days without stool at all. We had her set up for a breath test last month but she was sick and was on antibiotics. This was canceled. Stool studies negative. She is up-to-date on colonoscopy. She does have concerns about colon cancer given her history of polyps in her dance history. Reglan seems to be necessary for management gastroparesis. She comes off this medication she has severe nausea  Past Medical History  Diagnosis Date  . PTSD (post-traumatic stress disorder)   . Bipolar 1 disorder   . Heart murmur   . Hyperthyroidism   . Diverticulosis of colon   . Keratosis, actinic   . Family hx of colon cancer     age 56-father  . GERD (gastroesophageal reflux disease)   . IBS (irritable bowel syndrome)   . Chronic nausea   . S/P endoscopy 05/30/2010    Dr Jamieon Lannen-> non--critical Schatzki's ring, s/p 39F dilation  . S/P colonoscopy 05/30/2010    LAX sphincter tone, anal papilla, left-sided diverticulosis, normal random biopsies,, 1 polyp-TA  . Polysubstance abuse   . Anxiety   . Depression   . Tubular adenoma of colon 05/30/2010    Next colonoscopy 05/2015    Past Surgical History  Procedure Date  . External ear surgery   . Neck surgery     Prior to Admission medications   Medication Sig Start Date End Date Taking? Authorizing Provider  albuterol (PROVENTIL HFA;VENTOLIN HFA) 108 (90 BASE) MCG/ACT inhaler Inhale 2 puffs into the lungs every 6 (six) hours as needed. For shortness of breath    Yes Historical Provider, MD  clindamycin (CLEOCIN) 300 MG capsule Take 300 mg by mouth 4 (four) times daily. Infection from a tooth, should be finished by time of procedure    Yes Historical Provider, MD  clonazePAM (KLONOPIN)  0.5 MG tablet Take 0.5 mg by mouth 3 (three) times daily.    Yes Historical Provider, MD  dexlansoprazole (DEXILANT) 60 MG capsule Take 60 mg by mouth daily.     Yes Historical Provider, MD  hydrocodone-acetaminophen (LORCET-HD) 5-500 MG per capsule Take 1 capsule by mouth every 6 (six) hours as needed. Tooth pain    Yes Historical Provider, MD  lamoTRIgine (LAMICTAL) 100 MG tablet Take 100 mg by mouth daily.    Yes Historical Provider, MD  levothyroxine (SYNTHROID, LEVOTHROID) 50 MCG tablet Take 50 mcg by mouth daily.     Yes Historical Provider, MD  lithium carbonate 300 MG capsule Take 600 mg by mouth 2 (two) times daily with a meal.    Yes Historical Provider, MD  metoCLOPramide (REGLAN) 10 MG tablet Take 10 mg by mouth 3 (three) times daily before meals.    Yes Historical Provider, MD  Probiotic Product (PROBIOTIC PO) Take 1 tablet by mouth daily.    Yes Historical Provider, MD  traZODone (DESYREL) 100 MG tablet Take 100 mg by mouth at bedtime.     Yes Historical Provider, MD    Allergies as of 11/28/2011  . (No Known Allergies)    Family History  Problem Relation Age of Onset  . Diabetes Mother   . Hypertension Mother   . Colon cancer Father 59  . Hypertension Father   . Hypertension Sister   .  Heart attack Brother     History   Social History  . Marital Status: Divorced    Spouse Name: N/A    Number of Children: 1  . Years of Education: N/A   Occupational History  . disabled    Social History Main Topics  . Smoking status: Current Everyday Smoker -- 0.5 packs/day  . Smokeless tobacco: Not on file  . Alcohol Use: No  . Drug Use: Yes     hx cocaine abuse, last time last, still uses marijuana 4 times per week,   . Sexually Active: No   Other Topics Concern  . Not on file   Social History Narrative   Lives w/ fiance    Review of Systems: See HPI, otherwise negative ROS  Physical Exam: BP 115/75  Pulse 73  Temp(Src) 98.1 F (36.7 C) (Temporal)  Ht 5\' 5"   (1.651 m)  Wt 178 lb 9.6 oz (81.012 kg)  BMI 29.72 kg/m2 General:   Alert,  Well-developed, well-nourished, pleasant and cooperative in NAD Skin:  Intact without significant lesions or rashes. Eyes:  Sclera clear, no icterus.   Conjunctiva pink. Ears:  Normal auditory acuity. Nose:  No deformity, discharge,  or lesions. Mouth:  No deformity or lesions. Neck:  Supple; no masses or thyromegaly. No significant cervical adenopathy. Lungs:  Clear throughout to auscultation.   No wheezes, crackles, or rhonchi. No acute distress. Heart:  Regular rate and rhythm; no murmurs, clicks, rubs,  or gallops. Abdomen: Non-distended, normal bowel sounds.  No succussion splash Soft and nontender without appreciable mass or hepatosplenomegaly.  Pulses:  Normal pulses noted. Extremities:  Without clubbing or edema.  Impression/Plan:                 Primary Care Physician:  Leo Grosser, MD, MD Primary Gastroenterologist:  Dr.   Pre-Procedure History & Physical: HPI:  Sheryl Hall is a 51 y.o. female here for   Past Medical History  Diagnosis Date  . PTSD (post-traumatic stress disorder)   . Bipolar 1 disorder   . Heart murmur   . Hyperthyroidism   . Diverticulosis of colon   . Keratosis, actinic   . Family hx of colon cancer     age 87-father  . GERD (gastroesophageal reflux disease)   . IBS (irritable bowel syndrome)   . Chronic nausea   . S/P endoscopy 05/30/2010    Dr Diara Chaudhari-> non--critical Schatzki's ring, s/p 55F dilation  . S/P colonoscopy 05/30/2010    LAX sphincter tone, anal papilla, left-sided diverticulosis, normal random biopsies,, 1 polyp-TA  . Polysubstance abuse   . Anxiety   . Depression   . Tubular adenoma of colon 05/30/2010    Next colonoscopy 05/2015    Past Surgical History  Procedure Date  . External ear surgery   . Neck surgery     Prior to Admission medications   Medication Sig Start Date End Date Taking? Authorizing Provider  albuterol  (PROVENTIL HFA;VENTOLIN HFA) 108 (90 BASE) MCG/ACT inhaler Inhale 2 puffs into the lungs every 6 (six) hours as needed. For shortness of breath    Yes Historical Provider, MD  clindamycin (CLEOCIN) 300 MG capsule Take 300 mg by mouth 4 (four) times daily. Infection from a tooth, should be finished by time of procedure    Yes Historical Provider, MD  clonazePAM (KLONOPIN) 0.5 MG tablet Take 0.5 mg by mouth 3 (three) times daily.    Yes Historical Provider, MD  dexlansoprazole (DEXILANT) 60 MG capsule Take  60 mg by mouth daily.     Yes Historical Provider, MD  hydrocodone-acetaminophen (LORCET-HD) 5-500 MG per capsule Take 1 capsule by mouth every 6 (six) hours as needed. Tooth pain    Yes Historical Provider, MD  lamoTRIgine (LAMICTAL) 100 MG tablet Take 100 mg by mouth daily.    Yes Historical Provider, MD  levothyroxine (SYNTHROID, LEVOTHROID) 50 MCG tablet Take 50 mcg by mouth daily.     Yes Historical Provider, MD  lithium carbonate 300 MG capsule Take 600 mg by mouth 2 (two) times daily with a meal.    Yes Historical Provider, MD  metoCLOPramide (REGLAN) 10 MG tablet Take 10 mg by mouth 3 (three) times daily before meals.    Yes Historical Provider, MD  Probiotic Product (PROBIOTIC PO) Take 1 tablet by mouth daily.    Yes Historical Provider, MD  traZODone (DESYREL) 100 MG tablet Take 100 mg by mouth at bedtime.     Yes Historical Provider, MD    Allergies as of 11/28/2011  . (No Known Allergies)    Family History  Problem Relation Age of Onset  . Diabetes Mother   . Hypertension Mother   . Colon cancer Father 36  . Hypertension Father   . Hypertension Sister   . Heart attack Brother     History   Social History  . Marital Status: Divorced    Spouse Name: N/A    Number of Children: 1  . Years of Education: N/A   Occupational History  . disabled    Social History Main Topics  . Smoking status: Current Everyday Smoker -- 0.5 packs/day  . Smokeless tobacco: Not on file  .  Alcohol Use: No  . Drug Use: Yes     hx cocaine abuse, last time last, still uses marijuana 4 times per week,   . Sexually Active: No   Other Topics Concern  . Not on file   Social History Narrative   Lives w/ fiance    Review of Systems: See HPI, otherwise negative ROS  Physical Exam: BP 115/75  Pulse 73  Temp(Src) 98.1 F (36.7 C) (Temporal)  Ht 5\' 5"  (1.651 m)  Wt 178 lb 9.6 oz (81.012 kg)  BMI 29.72 kg/m2 General:   Alert,  Well-developed, well-nourished, pleasant and cooperative in NAD Skin:  Intact without significant lesions or rashes. Eyes:  Sclera clear, no icterus.   Conjunctiva pink. Ears:  Normal auditory acuity. Nose:  No deformity, discharge,  or lesions. Mouth:  No deformity or lesions. Neck:  Supple; no masses or thyromegaly. No significant cervical adenopathy. Lungs:  Clear throughout to auscultation.   No wheezes, crackles, or rhonchi. No acute distress. Heart:  Regular rate and rhythm; no murmurs, clicks, rubs,  or gallops. Abdomen: Non-distended, normal bowel sounds.  Soft and nontender without appreciable mass or hepatosplenomegaly.  Pulses:  Normal pulses noted. Extremities:  Without clubbing or edema.  Impression/Plan:

## 2011-12-01 ENCOUNTER — Other Ambulatory Visit: Payer: Self-pay | Admitting: Gastroenterology

## 2011-12-01 DIAGNOSIS — R140 Abdominal distension (gaseous): Secondary | ICD-10-CM

## 2011-12-02 ENCOUNTER — Ambulatory Visit (INDEPENDENT_AMBULATORY_CARE_PROVIDER_SITE_OTHER): Payer: Medicaid Other | Admitting: Internal Medicine

## 2011-12-02 DIAGNOSIS — R197 Diarrhea, unspecified: Secondary | ICD-10-CM

## 2011-12-02 LAB — IFOBT (OCCULT BLOOD): IFOBT: NEGATIVE

## 2011-12-03 NOTE — Progress Notes (Signed)
ifobt negative .

## 2011-12-04 ENCOUNTER — Encounter (HOSPITAL_COMMUNITY): Payer: Self-pay | Admitting: Pharmacy Technician

## 2011-12-11 ENCOUNTER — Encounter (HOSPITAL_COMMUNITY): Payer: Self-pay | Admitting: *Deleted

## 2011-12-11 ENCOUNTER — Encounter (HOSPITAL_COMMUNITY)
Admission: RE | Disposition: A | Discharge status: Home / Self Care | Payer: Self-pay | Source: Ambulatory Visit | Attending: Internal Medicine

## 2011-12-11 ENCOUNTER — Ambulatory Visit (HOSPITAL_COMMUNITY)
Admission: RE | Admit: 2011-12-11 | Discharge: 2011-12-11 | Disposition: A | Discharge status: Home / Self Care | Payer: Medicaid Other | Source: Ambulatory Visit | Attending: Internal Medicine | Admitting: Internal Medicine

## 2011-12-11 DIAGNOSIS — R197 Diarrhea, unspecified: Secondary | ICD-10-CM | POA: Insufficient documentation

## 2011-12-11 DIAGNOSIS — Z5309 Procedure and treatment not carried out because of other contraindication: Secondary | ICD-10-CM | POA: Insufficient documentation

## 2011-12-11 SURGERY — HYDROGEN BREATH TEST
Anesthesia: LOCAL

## 2011-12-11 MED ORDER — LACTULOSE 10 GM/15ML PO SOLN
ORAL | Status: AC
  Administered 2011-12-11: 25 g via ORAL
  Filled 2011-12-11: qty 60

## 2011-12-11 NOTE — Progress Notes (Signed)
Pt vomited large amount fluid, also resembling lactulose. Breathing test delayed until Dr Jena Gauss notified.

## 2011-12-11 NOTE — Progress Notes (Signed)
No beans, bran or high fiber cereal the day before the procedure? no NPO except for water 12 hours before procedure? yes No smoking, sleeping or vigorous exercising for at least 30 before procedure? no Recent antibiotic use and/or diarrhea? no   If yes, physician notified.  

## 2011-12-11 NOTE — Progress Notes (Signed)
Dr Jena Gauss informed of pt's condition. Procedure to be cancelled. Pt to return to office at another date for follow-up. Pt agreeable. Office notified.

## 2011-12-16 ENCOUNTER — Encounter (HOSPITAL_COMMUNITY): Payer: Self-pay | Admitting: Internal Medicine

## 2011-12-22 ENCOUNTER — Telehealth: Payer: Self-pay | Admitting: Gastroenterology

## 2011-12-22 NOTE — Telephone Encounter (Signed)
Noted; just needs an ov

## 2011-12-22 NOTE — Telephone Encounter (Signed)
LMOM to call back

## 2011-12-22 NOTE — Telephone Encounter (Signed)
Pt aware of what was said and made her an appointment on February 5th with AS at 10:00

## 2011-12-22 NOTE — Telephone Encounter (Signed)
Pt called and stated that she will not be able to do the hydrogen breath test- it makes her to sick- she wants to know what else can be done besides that - please advised

## 2011-12-30 ENCOUNTER — Ambulatory Visit (INDEPENDENT_AMBULATORY_CARE_PROVIDER_SITE_OTHER): Payer: Medicaid Other | Admitting: Gastroenterology

## 2011-12-30 ENCOUNTER — Encounter: Payer: Self-pay | Admitting: Gastroenterology

## 2011-12-30 VITALS — BP 120/75 | HR 64 | Temp 98.2°F | Ht 67.0 in | Wt 177.6 lb

## 2011-12-30 DIAGNOSIS — R140 Abdominal distension (gaseous): Secondary | ICD-10-CM

## 2011-12-30 DIAGNOSIS — K3184 Gastroparesis: Secondary | ICD-10-CM

## 2011-12-30 DIAGNOSIS — K589 Irritable bowel syndrome without diarrhea: Secondary | ICD-10-CM

## 2011-12-30 DIAGNOSIS — R143 Flatulence: Secondary | ICD-10-CM

## 2011-12-30 MED ORDER — DICYCLOMINE HCL 10 MG PO CAPS: 10.0000 mg | ORAL_CAPSULE | Freq: Two times a day (BID) | ORAL | Status: DC

## 2011-12-30 NOTE — Assessment & Plan Note (Signed)
Lower abdominal cramping prior to loose stools. Will attempt Bentyl BID. Low-dose, but will see if pt tolerates. Appears in past did not help. May need to titrate.

## 2011-12-30 NOTE — Patient Instructions (Signed)
Review the gastroparesis diet handout.  You may start taking Bentyl twice a day. Monitor for dry mouth, constipation. This is to help with belly cramping and loose stools.  We will be discussing with Dr. Jena Gauss how to proceed with the breath test. I will be in touch with you shortly!!

## 2011-12-30 NOTE — Assessment & Plan Note (Addendum)
51 year old female with complaints of abdominal bloating, diarrhea usually daily, negative work-up thus far. Did well with Align but unable to afford. Attempted HBT for SBBO, unable to tolerate due to vomiting. Pt willing to try if anti-emetic given prior to procedure. Will discuss with Dr. Jena Gauss possible prophlyactic medication prior to procedure.  In interim, continue probiotics.  Further recommendations to follow.   Addendum 2/20: treat empirically for SBBO. Cipro and Flagyl X 7 days provided.

## 2011-12-30 NOTE — Progress Notes (Signed)
Referring Provider: Leo Grosser, MD Primary Care Physician:  Leo Grosser, MD, MD Primary Gastroenterologist: Dr. Jena Gauss   Chief Complaint  Patient presents with  . Follow-up    feels the same  . Bloated    HPI:   [name] returns today after attempting to complete HBT for possible SBBO. Was unable to complete due to vomiting. States kept down the lactulose about 20 minutes but was unable to tolerate. Has hx of abdominal bloating and diarrhea as well as gastroparesis. Unable to wean off Reglan due to severe nausea.  Stool studies negative, negative ifobt. Negative celiac. Avoiding dairy products. Did well with Align, but states generic did not work as well.    Diarrhea usually every day. Felt like yogurt coming out. 3-4 times per day. Has cramping before.   June 2011 169 August 201 166 Sept 2012: 171 Dec 2012: 179 Today 177  Past Medical History  Diagnosis Date  . PTSD (post-traumatic stress disorder)   . Bipolar 1 disorder   . Heart murmur   . Hyperthyroidism   . Diverticulosis of colon   . Keratosis, actinic   . Family hx of colon cancer     age 50-father  . GERD (gastroesophageal reflux disease)   . IBS (irritable bowel syndrome)   . Chronic nausea   . S/P endoscopy 05/30/2010    Dr Rourk-> non--critical Schatzki's ring, s/p 70F dilation  . S/P colonoscopy 05/30/2010    LAX sphincter tone, anal papilla, left-sided diverticulosis, normal random biopsies,, 1 polyp-TA  . Polysubstance abuse   . Anxiety   . Depression   . Tubular adenoma of colon 05/30/2010    Next colonoscopy 05/2015    Past Surgical History  Procedure Date  . External ear surgery   . Neck surgery   . Hydrogen breath test 12/11/2011    Procedure: HYDROGEN BREATH TEST;  Surgeon: Corbin Ade, MD;  Location: [facility];  Service: Endoscopy;  Laterality: N/A;  for Bacterial Overgrowth/8:30    Current Outpatient Prescriptions  Medication Sig Dispense Refill  . albuterol (PROVENTIL  HFA;VENTOLIN HFA) 108 (90 BASE) MCG/ACT inhaler Inhale 2 puffs into the lungs every 6 (six) hours as needed. For shortness of breath       . clonazePAM (KLONOPIN) 0.5 MG tablet Take 0.5 mg by mouth 3 (three) times daily.       Marland Kitchen dexlansoprazole (DEXILANT) 60 MG capsule Take 60 mg by mouth daily.        Marland Kitchen lamoTRIgine (LAMICTAL) 100 MG tablet Take 100 mg by mouth daily.       Marland Kitchen levothyroxine (SYNTHROID, LEVOTHROID) 50 MCG tablet Take 50 mcg by mouth daily.        Marland Kitchen lithium carbonate 300 MG capsule Take 600 mg by mouth 2 (two) times daily with a meal.       . metoCLOPramide (REGLAN) 10 MG tablet Take 10 mg by mouth 3 (three) times daily before meals.       . Probiotic Product (PROBIOTIC PO) Take 1 tablet by mouth daily.       . traZODone (DESYREL) 100 MG tablet Take 100 mg by mouth at bedtime.        . dicyclomine (BENTYL) 10 MG capsule Take 1 capsule (10 mg total) by mouth 2 times daily at 12 noon and 4 pm.  60 capsule  3    Allergies as of 12/30/2011  . (No Known Allergies)    Family History  Problem Relation Age of Onset  .  Diabetes Mother   . Hypertension Mother   . Colon cancer Father 17  . Hypertension Father   . Hypertension Sister   . Heart attack Brother     History   Social History  . Marital Status: Divorced    Spouse Name: N/A    Number of Children: 1  . Years of Education: N/A   Occupational History  . disabled    Social History Main Topics  . Smoking status: Current Everyday Smoker -- 0.5 packs/day  . Smokeless tobacco: None  . Alcohol Use: No  . Drug Use: Yes     hx cocaine abuse, last time last, still uses marijuana 4 times per week,   . Sexually Active: No   Other Topics Concern  . None   Social History Narrative   Lives w/ fiance    Review of Systems: Gen: Denies fever, chills, anorexia. Denies fatigue, weakness, weight loss.  CV: Denies chest pain, palpitations, syncope, peripheral edema, and claudication. Resp: Denies dyspnea at rest, cough,  wheezing, coughing up blood, and pleurisy. GI: Denies vomiting blood, jaundice, and fecal incontinence.   Denies dysphagia or odynophagia. Derm: Denies rash, itching, dry skin Psych: Denies depression, anxiety, memory loss, confusion. No homicidal or suicidal ideation.  Heme: Denies bruising, bleeding, and enlarged lymph nodes.  Physical Exam: BP 120/75  Pulse 64  Temp(Src) 98.2 F (36.8 C) (Temporal)  Ht 5\' 7"  (1.702 m)  Wt 177 lb 9.6 oz (80.559 kg)  BMI 27.82 kg/m2 General:   Alert and oriented. No distress noted. Pleasant and cooperative.  Head:  Normocephalic and atraumatic. Eyes:  Conjuctiva clear without scleral icterus. Mouth:  Oral mucosa pink and moist. Good dentition. No lesions. Neck:  Supple, without mass or thyromegaly. Heart:  S1, S2 present without murmurs, rubs, or gallops. Regular rate and rhythm. Abdomen:  +BS, soft, non-tender and non-distended. No rebound or guarding. No HSM or masses noted. Msk:  Symmetrical without gross deformities. Normal posture. Extremities:  Without edema. Neurologic:  Alert and  oriented x4;  grossly normal neurologically. Skin:  Intact without significant lesions or rashes. Cervical Nodes:  No significant cervical adenopathy. Psych:  Alert and cooperative. Normal mood and affect.

## 2011-12-30 NOTE — Assessment & Plan Note (Signed)
Wt continues to increase. 169 in June 2011, 171 Sept 2012, now 177. On Reglan, unable to wean off due to severe nausea. Likely not helping diarrhea. Will continue for now, consider weaning off in future if possible.

## 2011-12-31 NOTE — Progress Notes (Signed)
Faxed to PCP

## 2012-01-14 ENCOUNTER — Other Ambulatory Visit: Payer: Self-pay | Admitting: Gastroenterology

## 2012-01-14 ENCOUNTER — Telehealth: Payer: Self-pay | Admitting: Gastroenterology

## 2012-01-14 DIAGNOSIS — R197 Diarrhea, unspecified: Secondary | ICD-10-CM

## 2012-01-14 MED ORDER — METRONIDAZOLE 500 MG PO TABS: 500.0000 mg | ORAL_TABLET | Freq: Three times a day (TID) | ORAL | Status: AC

## 2012-01-14 MED ORDER — CIPROFLOXACIN HCL 500 MG PO TABS: 500.0000 mg | ORAL_TABLET | Freq: Two times a day (BID) | ORAL | Status: AC

## 2012-01-14 NOTE — Telephone Encounter (Signed)
Noted. Hx of IBS-D. Any fever/chills? Is diarrhea worse than baseline?  If so, obtain Cdiff PCR and culture.   We will also start her on Cipro and Flagyl to treat possible SBBO. I have talked with RMR about repeating her HBT, but we will empirically treat now and see how she responds. She was unable to tolerate HBT in past.   Continue Probiotic. Bentyl may be taken TID.   I've sent in prescription for Cipro/Flagyl. Take for 7 days. Stool studies return as soon as possible. Doubt infectious etiology at this point.

## 2012-01-14 NOTE — Telephone Encounter (Signed)
Routed to AS 

## 2012-01-14 NOTE — Telephone Encounter (Signed)
Pt called- having severe diarrhea and abdominal cramping- not sure what she should do- please call her @ [phone]

## 2012-01-14 NOTE — Telephone Encounter (Signed)
Pt aware, symptoms are only slightly worse than before. Pt still wants to do stool studies. Pt does not have a fever/ chills. She will pick up stool containers tomorrow.

## 2012-01-15 NOTE — Telephone Encounter (Signed)
Thank you. Will await stool studies.

## 2012-01-17 ENCOUNTER — Other Ambulatory Visit: Payer: Self-pay | Admitting: Gastroenterology

## 2012-01-26 ENCOUNTER — Telehealth: Payer: Self-pay

## 2012-01-26 NOTE — Telephone Encounter (Signed)
Pt called- she is having a lot of cramping and frequent bowel movements that are NOT diarrhea, just soft stool. No fever, she is taking meds as prescribed and stool studies came back normal.   Pt wants to know what she needs to do next. Please advise.

## 2012-01-26 NOTE — Telephone Encounter (Signed)
Made patient appointment to see [facility] Tuesday at 11:30

## 2012-01-26 NOTE — Telephone Encounter (Signed)
Office visit with extender 

## 2012-01-27 ENCOUNTER — Ambulatory Visit (INDEPENDENT_AMBULATORY_CARE_PROVIDER_SITE_OTHER): Payer: Medicaid Other | Admitting: Gastroenterology

## 2012-01-27 ENCOUNTER — Telehealth: Payer: Self-pay

## 2012-01-27 ENCOUNTER — Encounter: Payer: Self-pay | Admitting: Gastroenterology

## 2012-01-27 DIAGNOSIS — K589 Irritable bowel syndrome without diarrhea: Secondary | ICD-10-CM

## 2012-01-27 DIAGNOSIS — K219 Gastro-esophageal reflux disease without esophagitis: Secondary | ICD-10-CM

## 2012-01-27 DIAGNOSIS — R141 Gas pain: Secondary | ICD-10-CM

## 2012-01-27 DIAGNOSIS — R140 Abdominal distension (gaseous): Secondary | ICD-10-CM

## 2012-01-27 DIAGNOSIS — K3184 Gastroparesis: Secondary | ICD-10-CM

## 2012-01-27 MED ORDER — HYOSCYAMINE SULFATE ER 0.375 MG PO TB12: 0.3750 mg | ORAL_TABLET | Freq: Two times a day (BID) | ORAL | Status: DC | PRN

## 2012-01-27 NOTE — Telephone Encounter (Signed)
Pt called- Medicaid will not pay for the levbid. Do you want to change medications or try a PA? Please advise.

## 2012-01-27 NOTE — Patient Instructions (Signed)
Samples of Align given today. Take one daily until complete. You may continue probiotics as long as needed. [facility] is reasonable in price and effective as well.  Stop Bentyl. Start Levbid for cramps and increased number of stools.  Decrease reglan to just before large meals of day.  Call in two weeks to let me know how you are doing.  Irritable Bowel Syndrome You have a problem with your large bowel. This is called irritable bowel syndrome or spastic colon. It can cause cramping in the lower belly. You may also have more rumbling, gurgling, and bloating. Diarrhea and mucus in the stool are common. Constipation with hard small stools is also common. The pain will often recede after a bowel movement. This problem can come and go for months or years. The cause is unknown. A poor diet and emotional stress can add to the problems. There is no test to prove you have irritable bowel. Some tests and X-rays may be needed to be sure you do not have a more serious problem. TREATMENT  Diet - Increase fiber and bulk (whole grains, bran, vegetables, fruit) and reduce fats (fried foods, dairy products, meats).   Exercise regularly. Use walking or other exercises to reduce your stress.   Try a psyllium product. One tablespoon in water two times daily may be helpful.   For diarrhea from irritable bowel, avoid food containing fructose and lactose.   Reduce gas and bloating by avoiding milk products, beans, onions, carrots, prunes, apricots, bananas, raisins, pretzels, and bagels.   See your caregiver for follow-up as recommended.  SEEK IMMEDIATE MEDICAL CARE IF:  You have increasing abdominal pain.   You have lasting diarrhea.   You have severe constipation.   You have blood in your stools.   You have problems urinating.   You have a fever.  Medications to treat spasms, diarrhea, anxiety, or depression may also be beneficial. Your caregiver can help you with this. MAKE SURE YOU:     Understand these instructions.   Will watch your condition.   Will get help right away if you are not doing well or get worse.  Document Released: 11/10/2005 Document Revised: 10/30/2011 Document Reviewed: 06/09/2007 [facility] Patient Information 2012 [place], [place].

## 2012-01-27 NOTE — Assessment & Plan Note (Signed)
IBS diarrhea predominant. Insomnia related to bentyl. 6-7 small soft stools daily. Abd bloating/cramps daily. OTC probiotics not helping. Continued weight gain.  Stop bentyl. Trial of levbid. Align for 30 days. For gastroparesis, multiple small meals daily, do not overeat. Decreased caffeine consumption. Patient really not having much nausea lately and I suggest taking reglan only prior to substantial meals. She tells me she often takes it without food. This may help stool frequency with cutting back.   Continue Nexium as before.  PR in two weeks. Also encouraged her plan for daily exercise. Would advise eliminating illicit drug use.

## 2012-01-27 NOTE — Progress Notes (Signed)
Primary Care Physician: Leo Grosser, MD, MD  Primary Gastroenterologist: Roetta Sessions, MD    Chief Complaint  Patient presents with  . Abdominal Pain    HPI: Sheryl Hall is a 51 y.o. female here for followup of abdominal pain, diarrhea, abdominal bloating. History of chronic GERD and irritable bowel syndrome diarrhea predominant. Since her last office visit couple weeks ago, she was given empirical treatment for small bowel bacterial overgrowth with Cipro and Flagyl. She was unable to complete had urgent breath test due to vomiting. She is actually taken 2 courses of empiric Cipro and Flagyl and notes that she had no improvement in her abdominal bloating or stool frequency. She states it actually made her more nauseated. She has gastroparesis and has been unable to come off of Reglan due to increased nausea in the past. Since her last office visit she is also advised to increase her Bentyl to twice daily but she tells me she did not tolerate the afternoon dose due to insomnia. She states she went several days without sleeping while taking the second dose of Bentyl.she never tried the Imodium as requested by Dr.Rourk. She states she just didn't feel like it.   Usually has 6-7 stools daily. Feels bloated. Several small amounts of soft stool daily. No melena, brbpr. No nocturnal diarrhea. No nocturnal abdominal pain. LLQ cramping daily associated with fecal urgency. Pain resolved with BM.   Sometimes water too but not daily. Plans to start exercise regimen. Wonders why she is gaining weight when she doesn't eat very much. States she essentially eats one meal in the evenings and snacks on fruit during the day. However she tells me she consumes 4-5 glasses of [facility] daily.  Stool culture, CDiff PCR negative 01/17/12.   Current Outpatient Prescriptions  Medication Sig Dispense Refill  . albuterol (PROVENTIL HFA;VENTOLIN HFA) 108 (90 BASE) MCG/ACT inhaler Inhale 2 puffs into the lungs  every 6 (six) hours as needed. For shortness of breath       . clonazePAM (KLONOPIN) 0.5 MG tablet Take 0.5 mg by mouth 3 (three) times daily.       Marland Kitchen dexlansoprazole (DEXILANT) 60 MG capsule Take 60 mg by mouth daily.        Marland Kitchen lamoTRIgine (LAMICTAL) 100 MG tablet Take 100 mg by mouth daily.       Marland Kitchen levothyroxine (SYNTHROID, LEVOTHROID) 50 MCG tablet Take 50 mcg by mouth daily.        Marland Kitchen lithium carbonate 300 MG capsule Take 600 mg by mouth 2 (two) times daily with a meal.       . metoCLOPramide (REGLAN) 10 MG tablet Take 1 tablet (10 mg total) by mouth 2 (two) times daily before a meal.      . Probiotic Product (PROBIOTIC PO) Take 1 tablet by mouth daily.       . traZODone (DESYREL) 100 MG tablet Take 100 mg by mouth at bedtime.        . hyoscyamine (LEVBID) 0.375 MG 12 hr tablet Take 1 tablet (0.375 mg total) by mouth every 12 (twelve) hours as needed for cramping.  60 tablet  3    Allergies as of 01/27/2012  . (No Known Allergies)    ROS:  General: Negative for anorexia, weight loss, fever, chills, fatigue, weakness. ENT: Negative for hoarseness, difficulty swallowing , nasal congestion. CV: Negative for chest pain, angina, palpitations, dyspnea on exertion, peripheral edema.  Respiratory: Negative for dyspnea at rest, dyspnea on exertion, cough, sputum, wheezing.  GI: See history of present illness. GU:  Negative for dysuria, hematuria, urinary incontinence, urinary frequency, nocturnal urination.  Endo: Negative for unusual weight change.    Physical Examination:   BP 123/67  Pulse 61  Temp(Src) 98.1 F (36.7 C) (Temporal)  Ht 5\' 6"  (1.676 m)  Wt 176 lb 6.4 oz (80.015 kg)  BMI 28.47 kg/m2  General: Well-nourished, well-developed in no acute distress.  Eyes: No icterus. Mouth: Oropharyngeal mucosa moist and pink , no lesions erythema or exudate. Lungs: Clear to auscultation bilaterally.  Heart: Regular rate and rhythm, no murmurs rubs or gallops.  Abdomen: Bowel sounds are  normal, nontender, nondistended, no hepatosplenomegaly or masses, no abdominal bruits or hernia , no rebound or guarding.   Extremities: No lower extremity edema. No clubbing or deformities. Neuro: Alert and oriented x 4   Skin: Warm and dry, no jaundice.   Psych: Alert and cooperative, normal mood and affect.

## 2012-01-28 NOTE — Progress Notes (Signed)
Faxed to PCP

## 2012-01-28 NOTE — Telephone Encounter (Signed)
Failed bentyl and side effects. What will they pay for in the class?

## 2012-01-29 NOTE — Telephone Encounter (Signed)
Forms completed

## 2012-01-29 NOTE — Telephone Encounter (Signed)
According to Medicaid, there are no formulary alternatives and we will have to try a PA for medication. Form is on [facility] cart.

## 2012-09-06 ENCOUNTER — Other Ambulatory Visit: Payer: Self-pay | Admitting: Family Medicine

## 2012-09-06 DIAGNOSIS — Z139 Encounter for screening, unspecified: Secondary | ICD-10-CM

## 2012-09-13 ENCOUNTER — Ambulatory Visit (HOSPITAL_COMMUNITY)
Admission: RE | Admit: 2012-09-13 | Discharge: 2012-09-13 | Disposition: A | Discharge status: Home / Self Care | Payer: Medicaid Other | Source: Ambulatory Visit | Attending: Family Medicine | Admitting: Family Medicine

## 2012-09-13 DIAGNOSIS — Z1231 Encounter for screening mammogram for malignant neoplasm of breast: Secondary | ICD-10-CM | POA: Insufficient documentation

## 2012-09-13 DIAGNOSIS — Z139 Encounter for screening, unspecified: Secondary | ICD-10-CM

## 2012-09-13 LAB — HM MAMMOGRAPHY: HM Mammogram: NORMAL

## 2012-09-27 ENCOUNTER — Emergency Department (HOSPITAL_COMMUNITY)
Admission: EM | Admit: 2012-09-27 | Discharge: 2012-09-27 | Disposition: A | Discharge status: Home / Self Care | Payer: Medicaid Other | Attending: Emergency Medicine | Admitting: Emergency Medicine

## 2012-09-27 ENCOUNTER — Encounter (HOSPITAL_COMMUNITY): Payer: Self-pay | Admitting: Emergency Medicine

## 2012-09-27 DIAGNOSIS — Y9389 Activity, other specified: Secondary | ICD-10-CM | POA: Insufficient documentation

## 2012-09-27 DIAGNOSIS — Y92009 Unspecified place in unspecified non-institutional (private) residence as the place of occurrence of the external cause: Secondary | ICD-10-CM | POA: Insufficient documentation

## 2012-09-27 DIAGNOSIS — F3289 Other specified depressive episodes: Secondary | ICD-10-CM | POA: Insufficient documentation

## 2012-09-27 DIAGNOSIS — F319 Bipolar disorder, unspecified: Secondary | ICD-10-CM | POA: Insufficient documentation

## 2012-09-27 DIAGNOSIS — Z79899 Other long term (current) drug therapy: Secondary | ICD-10-CM | POA: Insufficient documentation

## 2012-09-27 DIAGNOSIS — F17200 Nicotine dependence, unspecified, uncomplicated: Secondary | ICD-10-CM | POA: Insufficient documentation

## 2012-09-27 DIAGNOSIS — S51859A Open bite of unspecified forearm, initial encounter: Secondary | ICD-10-CM

## 2012-09-27 DIAGNOSIS — Z872 Personal history of diseases of the skin and subcutaneous tissue: Secondary | ICD-10-CM | POA: Insufficient documentation

## 2012-09-27 DIAGNOSIS — S51809A Unspecified open wound of unspecified forearm, initial encounter: Secondary | ICD-10-CM | POA: Insufficient documentation

## 2012-09-27 DIAGNOSIS — F411 Generalized anxiety disorder: Secondary | ICD-10-CM | POA: Insufficient documentation

## 2012-09-27 DIAGNOSIS — E0590 Thyrotoxicosis, unspecified without thyrotoxic crisis or storm: Secondary | ICD-10-CM | POA: Insufficient documentation

## 2012-09-27 DIAGNOSIS — Z8719 Personal history of other diseases of the digestive system: Secondary | ICD-10-CM | POA: Insufficient documentation

## 2012-09-27 DIAGNOSIS — F329 Major depressive disorder, single episode, unspecified: Secondary | ICD-10-CM | POA: Insufficient documentation

## 2012-09-27 DIAGNOSIS — W64XXXA Exposure to other animate mechanical forces, initial encounter: Secondary | ICD-10-CM | POA: Insufficient documentation

## 2012-09-27 MED ORDER — HYDROCODONE-ACETAMINOPHEN 5-325 MG PO TABS
1.0000 | ORAL_TABLET | Freq: Once | ORAL | Status: AC
  Administered 2012-09-27: 1 via ORAL
  Filled 2012-09-27: qty 1

## 2012-09-27 MED ORDER — HYDROCODONE-ACETAMINOPHEN 5-325 MG PO TABS: 1.0000 | ORAL_TABLET | ORAL | Status: DC | PRN

## 2012-09-27 MED ORDER — IBUPROFEN 800 MG PO TABS
800.0000 mg | ORAL_TABLET | Freq: Once | ORAL | Status: AC
  Administered 2012-09-27: 800 mg via ORAL
  Filled 2012-09-27: qty 1

## 2012-09-27 MED ORDER — TETANUS-DIPHTH-ACELL PERTUSSIS 5-2.5-18.5 LF-MCG/0.5 IM SUSP
0.5000 mL | Freq: Once | INTRAMUSCULAR | Status: AC
  Administered 2012-09-27: 0.5 mL via INTRAMUSCULAR
  Filled 2012-09-27: qty 0.5

## 2012-09-27 MED ORDER — AMOXICILLIN-POT CLAVULANATE 875-125 MG PO TABS: 1.0000 | ORAL_TABLET | Freq: Two times a day (BID) | ORAL | Status: DC

## 2012-09-27 MED ORDER — PROMETHAZINE HCL 12.5 MG PO TABS
12.5000 mg | ORAL_TABLET | Freq: Once | ORAL | Status: AC
  Administered 2012-09-27: 12.5 mg via ORAL
  Filled 2012-09-27: qty 1

## 2012-09-27 NOTE — ED Notes (Signed)
Pt c/o accidental dog scratch while playing with family dog this am. Dog is up to date on vaccinations. Pt states she needs tetanus shot.

## 2012-09-27 NOTE — ED Notes (Addendum)
2" lac to lt forearm, cut by her own dog's claw this am.  Cleansed and bandaged. Pt concerned about tetanus .  Say no bite or contact with mouth.

## 2012-09-27 NOTE — ED Provider Notes (Signed)
History     CSN: [id number]  Arrival date & time 09/27/12  1102   None     Chief Complaint  Patient presents with  . Animal Bite    (Consider location/radiation/quality/duration/timing/severity/associated sxs/prior treatment) HPI Comments: Patient states she was playing with the family dog when the dog scratched her deeply on the left arm. The dog is up-to-date on vaccinations. The patient however does not recall the date of her last tetanus shot  Patient is a 51 y.o. female presenting with animal bite. The history is provided by the patient.  Animal Bite  The incident occurred just prior to arrival. The incident occurred at home. Head/neck injury location: none. Torso Injury Location: none. There is an injury to the left forearm. Leg Injury Location: none. The pain is moderate. It is unlikely that a foreign body is present. Pertinent negatives include no chest pain, no numbness, no abdominal pain, no neck pain, no focal weakness, no seizures, no tingling and no cough. There have been no prior injuries to these areas. She is right-handed. Her tetanus status is out of date. She has been behaving normally. There were no sick contacts. She has received no recent medical care.    Past Medical History  Diagnosis Date  . PTSD (post-traumatic stress disorder)   . Bipolar 1 disorder   . Heart murmur   . Hyperthyroidism   . Diverticulosis of colon   . Keratosis, actinic   . Family hx of colon cancer     age 89-father  . GERD (gastroesophageal reflux disease)   . IBS (irritable bowel syndrome)   . Chronic nausea   . S/P endoscopy 05/30/2010    Dr Rourk-> non--critical Schatzki's ring, s/p 45F dilation  . S/P colonoscopy 05/30/2010    LAX sphincter tone, anal papilla, left-sided diverticulosis, normal random biopsies,, 1 polyp-TA  . Polysubstance abuse   . Anxiety   . Depression   . Tubular adenoma of colon 05/30/2010    Next colonoscopy 05/2015    Past Surgical History  Procedure Date    . External ear surgery   . Neck surgery   . Hydrogen breath test 12/11/2011    Could not complete due to vomiting. Procedure: HYDROGEN BREATH TEST;  Surgeon: Corbin Ade, MD;  Location: [facility];  Service: Endoscopy;  Laterality: N/A;  for Bacterial Overgrowth/8:30    Family History  Problem Relation Age of Onset  . Diabetes Mother   . Hypertension Mother   . Colon cancer Father 35  . Hypertension Father   . Hypertension Sister   . Heart attack Brother     History  Substance Use Topics  . Smoking status: Current Every Day Smoker -- 0.5 packs/day  . Smokeless tobacco: Not on file  . Alcohol Use: No    OB History    Grav Para Term Preterm Abortions TAB SAB Ect Mult Living   2 1 1  1  1   1       Review of Systems  Constitutional: Negative for activity change.       All ROS Neg except as noted in HPI  HENT: Negative for nosebleeds and neck pain.   Eyes: Negative for photophobia and discharge.  Respiratory: Negative for cough, shortness of breath and wheezing.   Cardiovascular: Negative for chest pain and palpitations.  Gastrointestinal: Negative for abdominal pain and blood in stool.  Genitourinary: Negative for dysuria, frequency and hematuria.  Musculoskeletal: Positive for arthralgias. Negative for back pain.  Skin:  Negative.   Neurological: Negative for dizziness, tingling, focal weakness, seizures, speech difficulty and numbness.  Psychiatric/Behavioral: Negative for hallucinations and confusion. The patient is nervous/anxious.        Bipolar, depression.    Allergies  Review of patient's allergies indicates no known allergies.  Home Medications   Current Outpatient Rx  Name  Route  Sig  Dispense  Refill  . ALBUTEROL SULFATE HFA 108 (90 BASE) MCG/ACT IN AERS   Inhalation   Inhale 2 puffs into the lungs every 6 (six) hours as needed. For shortness of breath          . CLONAZEPAM 0.5 MG PO TABS   Oral   Take 0.5 mg by mouth 3 (three) times daily.           . DEXLANSOPRAZOLE 60 MG PO CPDR   Oral   Take 60 mg by mouth daily.           Marland Kitchen HYOSCYAMINE SULFATE ER 0.375 MG PO TB12   Oral   Take 1 tablet (0.375 mg total) by mouth every 12 (twelve) hours as needed for cramping.   60 tablet   3   . LAMOTRIGINE 100 MG PO TABS   Oral   Take 100 mg by mouth daily.          Marland Kitchen LEVOTHYROXINE SODIUM 50 MCG PO TABS   Oral   Take 50 mcg by mouth daily.           Marland Kitchen LITHIUM CARBONATE 300 MG PO CAPS   Oral   Take 600 mg by mouth 2 (two) times daily with a meal.          . METOCLOPRAMIDE HCL 10 MG PO TABS   Oral   Take 1 tablet (10 mg total) by mouth 2 (two) times daily before a meal.         . PROBIOTIC PO   Oral   Take 1 tablet by mouth daily.          . TRAZODONE HCL 100 MG PO TABS   Oral   Take 100 mg by mouth at bedtime.             BP 122/72  Pulse 79  Temp 98.8 F (37.1 C)  Resp 20  Ht 5\' 6"  (1.676 m)  Wt 165 lb (74.844 kg)  BMI 26.63 kg/m2  SpO2 100%  Physical Exam  Nursing note and vitals reviewed. Constitutional: She is oriented to person, place, and time. She appears well-developed and well-nourished.  Non-toxic appearance.  HENT:  Head: Normocephalic.  Right Ear: Tympanic membrane and external ear normal.  Left Ear: Tympanic membrane and external ear normal.  Eyes: EOM and lids are normal. Pupils are equal, round, and reactive to light.  Neck: Normal range of motion. Neck supple. Carotid bruit is not present.  Cardiovascular: Normal rate, regular rhythm, normal heart sounds, intact distal pulses and normal pulses.   Pulmonary/Chest: Breath sounds normal. No respiratory distress.  Abdominal: Soft. Bowel sounds are normal. There is no tenderness. There is no guarding.  Musculoskeletal: Normal range of motion.       5.7cm laceration of the left forearm. No bone or tendon involvement.  Lymphadenopathy:       Head (right side): No submandibular adenopathy present.       Head (left side): No  submandibular adenopathy present.    She has no cervical adenopathy.  Neurological: She is alert and oriented to person, place, and time. She  has normal strength. No cranial nerve deficit or sensory deficit.       No motor or sensory deficit of the left upper ext.  Skin: Skin is warm and dry.  Psychiatric: She has a normal mood and affect. Her speech is normal.    ED Course  Procedures:LACERATION REPAIR LEFT FOREARM.   Patient identified by arm band. Permission for procedure given by the patient. Procedural time out taken before repair of laceration to the left forearm. The wound was cleansed with Shur-Clens. The wound was inspected, and no bone or tendon involvement. No foreign body noted. The wound was then painted with Betadine. The wound was repaired with Steri-Strips and tincture of benzoin. With loose wound edge approximation. Sterile dressing applied by me. Tetanus was updated. Patient tolerated the procedure without problem or complication.  Labs Reviewed - No data to display No results found.   No diagnosis found.    MDM  I have reviewed nursing notes, vital signs, and all appropriate lab and imaging results for this patient. The patient was scratched by a family dog. The tetanus was updated today. The dog's shots are up-to-date. Patient is to return if any signs of infection. Patient placed on Augmentin 2 times daily after eating. Patient states the area is throbbing and she was placed on Norco one every 4 hours as needed for pain #12 tablets.       Kathie Dike, [place] 09/27/12 1227

## 2012-09-27 NOTE — ED Provider Notes (Signed)
Medical screening examination/treatment/procedure(s) were performed by non-physician practitioner and as supervising physician I was immediately available for consultation/collaboration.   Shaday Rayborn L Herron Fero, MD 09/27/12 1512 

## 2012-09-30 ENCOUNTER — Emergency Department (HOSPITAL_COMMUNITY)
Admission: EM | Admit: 2012-09-30 | Discharge: 2012-09-30 | Disposition: A | Discharge status: Home / Self Care | Payer: Medicaid Other | Attending: Emergency Medicine | Admitting: Emergency Medicine

## 2012-09-30 ENCOUNTER — Encounter (HOSPITAL_COMMUNITY): Payer: Self-pay | Admitting: *Deleted

## 2012-09-30 DIAGNOSIS — F319 Bipolar disorder, unspecified: Secondary | ICD-10-CM | POA: Insufficient documentation

## 2012-09-30 DIAGNOSIS — E0590 Thyrotoxicosis, unspecified without thyrotoxic crisis or storm: Secondary | ICD-10-CM | POA: Insufficient documentation

## 2012-09-30 DIAGNOSIS — S51819A Laceration without foreign body of unspecified forearm, initial encounter: Secondary | ICD-10-CM

## 2012-09-30 DIAGNOSIS — F4310 Post-traumatic stress disorder, unspecified: Secondary | ICD-10-CM | POA: Insufficient documentation

## 2012-09-30 DIAGNOSIS — S51809A Unspecified open wound of unspecified forearm, initial encounter: Secondary | ICD-10-CM | POA: Insufficient documentation

## 2012-09-30 DIAGNOSIS — K219 Gastro-esophageal reflux disease without esophagitis: Secondary | ICD-10-CM | POA: Insufficient documentation

## 2012-09-30 DIAGNOSIS — W64XXXA Exposure to other animate mechanical forces, initial encounter: Secondary | ICD-10-CM | POA: Insufficient documentation

## 2012-09-30 DIAGNOSIS — D126 Benign neoplasm of colon, unspecified: Secondary | ICD-10-CM | POA: Insufficient documentation

## 2012-09-30 DIAGNOSIS — Z8679 Personal history of other diseases of the circulatory system: Secondary | ICD-10-CM | POA: Insufficient documentation

## 2012-09-30 DIAGNOSIS — Y939 Activity, unspecified: Secondary | ICD-10-CM | POA: Insufficient documentation

## 2012-09-30 DIAGNOSIS — Z8719 Personal history of other diseases of the digestive system: Secondary | ICD-10-CM | POA: Insufficient documentation

## 2012-09-30 DIAGNOSIS — F411 Generalized anxiety disorder: Secondary | ICD-10-CM | POA: Insufficient documentation

## 2012-09-30 DIAGNOSIS — K589 Irritable bowel syndrome without diarrhea: Secondary | ICD-10-CM | POA: Insufficient documentation

## 2012-09-30 DIAGNOSIS — Z9889 Other specified postprocedural states: Secondary | ICD-10-CM | POA: Insufficient documentation

## 2012-09-30 DIAGNOSIS — Y929 Unspecified place or not applicable: Secondary | ICD-10-CM | POA: Insufficient documentation

## 2012-09-30 DIAGNOSIS — F3289 Other specified depressive episodes: Secondary | ICD-10-CM | POA: Insufficient documentation

## 2012-09-30 DIAGNOSIS — F17200 Nicotine dependence, unspecified, uncomplicated: Secondary | ICD-10-CM | POA: Insufficient documentation

## 2012-09-30 DIAGNOSIS — F329 Major depressive disorder, single episode, unspecified: Secondary | ICD-10-CM | POA: Insufficient documentation

## 2012-09-30 NOTE — ED Notes (Signed)
Recheck of lac to lt forearm.

## 2012-10-01 NOTE — ED Provider Notes (Signed)
Medical screening examination/treatment/procedure(s) were performed by non-physician practitioner and as supervising physician I was immediately available for consultation/collaboration.  Natalye Kott, MD 10/01/12 0855 

## 2012-10-01 NOTE — ED Provider Notes (Signed)
History     CSN: [id number]  Arrival date & time 09/30/12  1449   First MD Initiated Contact with Patient 09/30/12 1517      Chief Complaint  Patient presents with  . Wound Check    (Consider location/radiation/quality/duration/timing/severity/associated sxs/prior treatment) HPI Comments: Sheryl Hall presents for a recheck of her laceration sustained on her left arm 3 days ago when she was scratched by her dogs nail.  The wound was repaired with sterile strips,  A few which are peeling away.  She is also concerned with possible infection as she has noticed a dried yellow substance around the injury site.  She denies increased pain, swelling, redness and has had no fevers. She is taking augmentin bid.    The history is provided by the patient.    Past Medical History  Diagnosis Date  . PTSD (post-traumatic stress disorder)   . Bipolar 1 disorder   . Heart murmur   . Hyperthyroidism   . Diverticulosis of colon   . Keratosis, actinic   . Family hx of colon cancer     age 105-father  . GERD (gastroesophageal reflux disease)   . IBS (irritable bowel syndrome)   . Chronic nausea   . S/P endoscopy 05/30/2010    Dr Rourk-> non--critical Schatzki's ring, s/p 58F dilation  . S/P colonoscopy 05/30/2010    LAX sphincter tone, anal papilla, left-sided diverticulosis, normal random biopsies,, 1 polyp-TA  . Polysubstance abuse   . Anxiety   . Depression   . Tubular adenoma of colon 05/30/2010    Next colonoscopy 05/2015    Past Surgical History  Procedure Date  . External ear surgery   . Neck surgery   . Hydrogen breath test 12/11/2011    Could not complete due to vomiting. Procedure: HYDROGEN BREATH TEST;  Surgeon: Corbin Ade, MD;  Location: [facility];  Service: Endoscopy;  Laterality: N/A;  for Bacterial Overgrowth/8:30    Family History  Problem Relation Age of Onset  . Diabetes Mother   . Hypertension Mother   . Colon cancer Father 9  . Hypertension Father   .  Hypertension Sister   . Heart attack Brother     History  Substance Use Topics  . Smoking status: Current Every Day Smoker -- 0.5 packs/day  . Smokeless tobacco: Not on file  . Alcohol Use: No    OB History    Grav Para Term Preterm Abortions TAB SAB Ect Mult Living   2 1 1  1  1   1       Review of Systems  Constitutional: Negative for fever and chills.  Respiratory: Negative for shortness of breath.   Skin: Positive for wound.  Neurological: Negative for numbness.    Allergies  Review of patient's allergies indicates no known allergies.  Home Medications   Current Outpatient Rx  Name  Route  Sig  Dispense  Refill  . ALBUTEROL SULFATE HFA 108 (90 BASE) MCG/ACT IN AERS   Inhalation   Inhale 2 puffs into the lungs every 6 (six) hours as needed. For shortness of breath          . AMOXICILLIN-POT CLAVULANATE 875-125 MG PO TABS   Oral   Take 1 tablet by mouth every 12 (twelve) hours.   14 tablet   0     Please take after eating.   Marland Kitchen CLONAZEPAM 0.5 MG PO TABS   Oral   Take 0.5 mg by mouth 3 (three) times  daily.          . DEXLANSOPRAZOLE 60 MG PO CPDR   Oral   Take 60 mg by mouth daily.           Marland Kitchen HYDROCODONE-ACETAMINOPHEN 5-325 MG PO TABS   Oral   Take 1 tablet by mouth every 4 (four) hours as needed for pain.   12 tablet   0   . HYOSCYAMINE SULFATE ER 0.375 MG PO TB12   Oral   Take 1 tablet (0.375 mg total) by mouth every 12 (twelve) hours as needed for cramping.   60 tablet   3   . LAMOTRIGINE 100 MG PO TABS   Oral   Take 100 mg by mouth daily.          Marland Kitchen LEVOTHYROXINE SODIUM 50 MCG PO TABS   Oral   Take 50 mcg by mouth daily.           Marland Kitchen LITHIUM CARBONATE 300 MG PO CAPS   Oral   Take 600 mg by mouth 2 (two) times daily with a meal.          . METOCLOPRAMIDE HCL 10 MG PO TABS   Oral   Take 1 tablet (10 mg total) by mouth 2 (two) times daily before a meal.         . PROBIOTIC PO   Oral   Take 1 tablet by mouth daily.            . TRAZODONE HCL 100 MG PO TABS   Oral   Take 100 mg by mouth at bedtime.             BP 167/78  Pulse 107  Temp 98 F (36.7 C) (Oral)  Resp 18  Ht 5\' 6"  (1.676 m)  Wt 166 lb (75.297 kg)  BMI 26.79 kg/m2  Physical Exam  Constitutional: She is oriented to person, place, and time. She appears well-developed and well-nourished.  HENT:  Head: Normocephalic.  Cardiovascular: Normal rate.   Pulmonary/Chest: Effort normal.  Musculoskeletal: She exhibits no tenderness.       Laceration left mid dorsal forearm with no edema, surrounding erythema, red streaking or drainage.  There is yellow dried benzoin around the steri strip edges,  Several which have peeled up.  Neurological: She is alert and oriented to person, place, and time. No sensory deficit.  Skin: Laceration noted.    ED Course  Procedures (including critical care time)  Labs Reviewed - No data to display No results found.   1. Laceration of forearm     Sterile strips which were peeling were gently removed,  New applied, pt tolerated well.  Wound margins healthy appearing and approximated.  MDM  Prn f/u.  Finish abx        [place], [place] 10/01/12 [phone]

## 2012-10-15 LAB — HM PAP SMEAR

## 2012-11-24 DIAGNOSIS — I639 Cerebral infarction, unspecified: Secondary | ICD-10-CM

## 2012-11-24 HISTORY — DX: Cerebral infarction, unspecified: I63.9

## 2012-12-29 ENCOUNTER — Ambulatory Visit (HOSPITAL_COMMUNITY)
Admission: RE | Admit: 2012-12-29 | Discharge: 2012-12-29 | Disposition: A | Discharge status: Home / Self Care | Payer: Medicaid Other | Source: Ambulatory Visit | Attending: Family Medicine | Admitting: Family Medicine

## 2012-12-29 ENCOUNTER — Other Ambulatory Visit: Payer: Self-pay | Admitting: Family Medicine

## 2012-12-29 ENCOUNTER — Other Ambulatory Visit: Payer: Self-pay | Admitting: Physician Assistant

## 2012-12-29 DIAGNOSIS — R55 Syncope and collapse: Secondary | ICD-10-CM

## 2012-12-29 DIAGNOSIS — G9389 Other specified disorders of brain: Secondary | ICD-10-CM | POA: Insufficient documentation

## 2012-12-29 DIAGNOSIS — R42 Dizziness and giddiness: Secondary | ICD-10-CM

## 2012-12-29 DIAGNOSIS — R269 Unspecified abnormalities of gait and mobility: Secondary | ICD-10-CM | POA: Insufficient documentation

## 2012-12-31 ENCOUNTER — Other Ambulatory Visit: Payer: Self-pay | Admitting: Family Medicine

## 2012-12-31 DIAGNOSIS — R0989 Other specified symptoms and signs involving the circulatory and respiratory systems: Secondary | ICD-10-CM

## 2013-01-07 ENCOUNTER — Ambulatory Visit (HOSPITAL_COMMUNITY)
Admission: RE | Admit: 2013-01-07 | Discharge: 2013-01-07 | Disposition: A | Discharge status: Home / Self Care | Payer: Medicaid Other | Source: Ambulatory Visit | Attending: Family Medicine | Admitting: Family Medicine

## 2013-01-07 ENCOUNTER — Other Ambulatory Visit (HOSPITAL_COMMUNITY): Payer: Medicaid Other

## 2013-01-07 ENCOUNTER — Other Ambulatory Visit (HOSPITAL_COMMUNITY): Payer: Self-pay | Admitting: Family Medicine

## 2013-01-07 DIAGNOSIS — R0989 Other specified symptoms and signs involving the circulatory and respiratory systems: Secondary | ICD-10-CM

## 2013-01-07 DIAGNOSIS — R55 Syncope and collapse: Secondary | ICD-10-CM | POA: Insufficient documentation

## 2013-01-14 ENCOUNTER — Ambulatory Visit (HOSPITAL_COMMUNITY)
Admission: RE | Admit: 2013-01-14 | Discharge: 2013-01-14 | Disposition: A | Discharge status: Home / Self Care | Payer: Medicaid Other | Source: Ambulatory Visit | Attending: Cardiology | Admitting: Cardiology

## 2013-01-14 DIAGNOSIS — G459 Transient cerebral ischemic attack, unspecified: Secondary | ICD-10-CM | POA: Insufficient documentation

## 2013-01-14 DIAGNOSIS — R42 Dizziness and giddiness: Secondary | ICD-10-CM | POA: Insufficient documentation

## 2013-01-14 NOTE — Progress Notes (Signed)
*  PRELIMINARY RESULTS* Echocardiogram 2D Echocardiogram has been performed.  Conrad Taunton 01/14/2013, 11:52 AM

## 2013-01-27 ENCOUNTER — Other Ambulatory Visit: Payer: Self-pay | Admitting: Neurology

## 2013-01-27 DIAGNOSIS — I6350 Cerebral infarction due to unspecified occlusion or stenosis of unspecified cerebral artery: Secondary | ICD-10-CM

## 2013-01-31 ENCOUNTER — Ambulatory Visit (HOSPITAL_COMMUNITY)
Admission: RE | Admit: 2013-01-31 | Discharge: 2013-01-31 | Disposition: A | Discharge status: Home / Self Care | Payer: Medicaid Other | Source: Ambulatory Visit | Attending: Neurology | Admitting: Neurology

## 2013-01-31 DIAGNOSIS — G9389 Other specified disorders of brain: Secondary | ICD-10-CM | POA: Insufficient documentation

## 2013-01-31 DIAGNOSIS — I6350 Cerebral infarction due to unspecified occlusion or stenosis of unspecified cerebral artery: Secondary | ICD-10-CM

## 2013-01-31 DIAGNOSIS — R51 Headache: Secondary | ICD-10-CM | POA: Insufficient documentation

## 2013-02-10 ENCOUNTER — Encounter: Payer: Self-pay | Admitting: Physician Assistant

## 2013-02-10 ENCOUNTER — Ambulatory Visit (INDEPENDENT_AMBULATORY_CARE_PROVIDER_SITE_OTHER): Payer: Medicaid Other | Admitting: Physician Assistant

## 2013-02-10 VITALS — BP 122/82 | HR 76 | Temp 98.5°F | Resp 18 | Ht 64.5 in | Wt 166.0 lb

## 2013-02-10 DIAGNOSIS — I639 Cerebral infarction, unspecified: Secondary | ICD-10-CM

## 2013-02-10 DIAGNOSIS — R569 Unspecified convulsions: Secondary | ICD-10-CM

## 2013-02-10 DIAGNOSIS — I6350 Cerebral infarction due to unspecified occlusion or stenosis of unspecified cerebral artery: Secondary | ICD-10-CM

## 2013-02-10 NOTE — Progress Notes (Signed)
Patient ID: Sheryl Hall MRN: [id number], DOB: 05-14-1961, 52 y.o. Date of Encounter: 02/10/2013, 12:23 PM    Chief Complaint: wants to discuss test results and neuro eval  HPI: 52 y.o. year old female has recently had CVA and seizures. Has had multiple tests and eval with neuro. Says neurologist was in and out very quickly and did not explain test results, diagnosis, etc. Has no new complaints, just wants to discuss above.     Home Meds: Current Outpatient Prescriptions on File Prior to Visit  Medication Sig Dispense Refill  . albuterol (PROVENTIL HFA;VENTOLIN HFA) 108 (90 BASE) MCG/ACT inhaler Inhale 2 puffs into the lungs every 6 (six) hours as needed. For shortness of breath       . clonazePAM (KLONOPIN) 0.5 MG tablet Take 0.5 mg by mouth 3 (three) times daily.       Marland Kitchen dexlansoprazole (DEXILANT) 60 MG capsule Take 60 mg by mouth daily.        Marland Kitchen lamoTRIgine (LAMICTAL) 100 MG tablet Take 100 mg by mouth daily. Takes once daily also with 75mg  dose which is TID      . levothyroxine (SYNTHROID, LEVOTHROID) 50 MCG tablet Take 50 mcg by mouth daily.        . Probiotic Product (PROBIOTIC PO) Take 1 tablet by mouth daily.       . traZODone (DESYREL) 100 MG tablet Take 100 mg by mouth at bedtime.        Marland Kitchen amoxicillin-clavulanate (AUGMENTIN) 875-125 MG per tablet Take 1 tablet by mouth every 12 (twelve) hours.  14 tablet  0  . HYDROcodone-acetaminophen (NORCO) 5-325 MG per tablet Take 1 tablet by mouth every 4 (four) hours as needed for pain.  12 tablet  0  . hyoscyamine (LEVBID) 0.375 MG 12 hr tablet Take 1 tablet (0.375 mg total) by mouth every 12 (twelve) hours as needed for cramping.  60 tablet  3  . lithium carbonate 300 MG capsule Take 600 mg by mouth 2 (two) times daily with a meal.       . metoCLOPramide (REGLAN) 10 MG tablet Take 1 tablet (10 mg total) by mouth 2 (two) times daily before a meal.       No current facility-administered medications on file prior to visit.     Allergies: No Known Allergies    Review of Systems: Constitutional: negative for chills, fever, night sweats, weight changes, or fatigue  HEENT: negative for vision changes, hearing loss, congestion, rhinorrhea, ST, epistaxis, or sinus pressure Cardiovascular: negative for chest pain or palpitations Respiratory: negative for hemoptysis, wheezing, shortness of breath, or cough Abdominal: negative for abdominal pain, nausea, vomiting, diarrhea, or constipation Dermatological: negative for rash Neurologic: negative for new neurologic changes.    Physical Exam: Blood pressure 122/82, pulse 76, temperature 98.5 F (36.9 C), temperature source Oral, resp. rate 18, height 5' 4.5" (1.638 m), weight 166 lb (75.297 kg)., Body mass index is 28.06 kg/(m^2). General: Well developed, well nourished, in no acute distress.  Neck: Supple. No thyromegaly. Full ROM. No lymphadenopathy. Lungs: Clear bilaterally to auscultation without wheezes, rales, or rhonchi. Breathing is unlabored. Heart: RRR with S1 S2. No murmurs, rubs, or gallops appreciated. Msk:  Strength and tone normal for age. Extremities/Skin: Warm and dry. No clubbing or cyanosis. No edema. No rashes or suspicious lesions. Neuro: Alert and oriented X 3. Moves all extremities spontaneously. Psych:  Responds to questions appropriately with a normal affect.    ASSESSMENT AND PLAN:  52 y.o. year  old female with recently diagnosed CVA and Seizure. I reviewed the following reports with pt.: Echo (Nml), U/S of Carotids, vertebrals (Nml), CT Brain, MRI Brain.  I also reviewed the EEG done at neuro and the neuro ov note that i have a copy of dated 01/18/13.  I answered her questions. I recommended no driving.  Pt agreeable. Is to f/u with neuro as he recommended in 3 months.   -  [address] [place], [place], [facility] 02/10/2013 12:23 PM

## 2013-03-01 ENCOUNTER — Emergency Department (HOSPITAL_COMMUNITY)
Admission: EM | Admit: 2013-03-01 | Discharge: 2013-03-01 | Disposition: A | Discharge status: Home / Self Care | Payer: Medicaid Other | Attending: Emergency Medicine | Admitting: Emergency Medicine

## 2013-03-01 ENCOUNTER — Encounter (HOSPITAL_COMMUNITY): Payer: Self-pay | Admitting: *Deleted

## 2013-03-01 ENCOUNTER — Emergency Department (HOSPITAL_COMMUNITY): Payer: Medicaid Other

## 2013-03-01 DIAGNOSIS — F319 Bipolar disorder, unspecified: Secondary | ICD-10-CM | POA: Insufficient documentation

## 2013-03-01 DIAGNOSIS — E785 Hyperlipidemia, unspecified: Secondary | ICD-10-CM | POA: Insufficient documentation

## 2013-03-01 DIAGNOSIS — F329 Major depressive disorder, single episode, unspecified: Secondary | ICD-10-CM | POA: Insufficient documentation

## 2013-03-01 DIAGNOSIS — Z8673 Personal history of transient ischemic attack (TIA), and cerebral infarction without residual deficits: Secondary | ICD-10-CM | POA: Insufficient documentation

## 2013-03-01 DIAGNOSIS — Z85038 Personal history of other malignant neoplasm of large intestine: Secondary | ICD-10-CM | POA: Insufficient documentation

## 2013-03-01 DIAGNOSIS — Z8719 Personal history of other diseases of the digestive system: Secondary | ICD-10-CM | POA: Insufficient documentation

## 2013-03-01 DIAGNOSIS — F3289 Other specified depressive episodes: Secondary | ICD-10-CM | POA: Insufficient documentation

## 2013-03-01 DIAGNOSIS — F4310 Post-traumatic stress disorder, unspecified: Secondary | ICD-10-CM | POA: Insufficient documentation

## 2013-03-01 DIAGNOSIS — Y939 Activity, unspecified: Secondary | ICD-10-CM | POA: Insufficient documentation

## 2013-03-01 DIAGNOSIS — W1809XA Striking against other object with subsequent fall, initial encounter: Secondary | ICD-10-CM | POA: Insufficient documentation

## 2013-03-01 DIAGNOSIS — Z7902 Long term (current) use of antithrombotics/antiplatelets: Secondary | ICD-10-CM | POA: Insufficient documentation

## 2013-03-01 DIAGNOSIS — Y929 Unspecified place or not applicable: Secondary | ICD-10-CM | POA: Insufficient documentation

## 2013-03-01 DIAGNOSIS — E538 Deficiency of other specified B group vitamins: Secondary | ICD-10-CM | POA: Insufficient documentation

## 2013-03-01 DIAGNOSIS — M545 Low back pain, unspecified: Secondary | ICD-10-CM

## 2013-03-01 DIAGNOSIS — IMO0002 Reserved for concepts with insufficient information to code with codable children: Secondary | ICD-10-CM | POA: Insufficient documentation

## 2013-03-01 DIAGNOSIS — F411 Generalized anxiety disorder: Secondary | ICD-10-CM | POA: Insufficient documentation

## 2013-03-01 DIAGNOSIS — Z79899 Other long term (current) drug therapy: Secondary | ICD-10-CM | POA: Insufficient documentation

## 2013-03-01 DIAGNOSIS — M5410 Radiculopathy, site unspecified: Secondary | ICD-10-CM

## 2013-03-01 DIAGNOSIS — S0990XA Unspecified injury of head, initial encounter: Secondary | ICD-10-CM | POA: Insufficient documentation

## 2013-03-01 DIAGNOSIS — R51 Headache: Secondary | ICD-10-CM

## 2013-03-01 DIAGNOSIS — E0590 Thyrotoxicosis, unspecified without thyrotoxic crisis or storm: Secondary | ICD-10-CM | POA: Insufficient documentation

## 2013-03-01 DIAGNOSIS — F17200 Nicotine dependence, unspecified, uncomplicated: Secondary | ICD-10-CM | POA: Insufficient documentation

## 2013-03-01 DIAGNOSIS — Z7982 Long term (current) use of aspirin: Secondary | ICD-10-CM | POA: Insufficient documentation

## 2013-03-01 DIAGNOSIS — Z872 Personal history of diseases of the skin and subcutaneous tissue: Secondary | ICD-10-CM | POA: Insufficient documentation

## 2013-03-01 DIAGNOSIS — R011 Cardiac murmur, unspecified: Secondary | ICD-10-CM | POA: Insufficient documentation

## 2013-03-01 DIAGNOSIS — K219 Gastro-esophageal reflux disease without esophagitis: Secondary | ICD-10-CM | POA: Insufficient documentation

## 2013-03-01 HISTORY — DX: Cerebral infarction, unspecified: I63.9

## 2013-03-01 HISTORY — DX: Low back pain, unspecified: M54.50

## 2013-03-01 LAB — CBC WITH DIFFERENTIAL/PLATELET
Basophils Relative: 0 % (ref 0–1)
Eosinophils Absolute: 0.1 10*3/uL (ref 0.0–0.7)
HCT: 39.9 % (ref 36.0–46.0)
Hemoglobin: 13.1 g/dL (ref 12.0–15.0)
Lymphs Abs: 2.1 10*3/uL (ref 0.7–4.0)
MCH: 33.2 pg (ref 26.0–34.0)
MCHC: 32.8 g/dL (ref 30.0–36.0)
Monocytes Absolute: 0.4 10*3/uL (ref 0.1–1.0)
Monocytes Relative: 7 % (ref 3–12)
Neutro Abs: 2.7 10*3/uL (ref 1.7–7.7)
RBC: 3.95 MIL/uL (ref 3.87–5.11)

## 2013-03-01 LAB — COMPREHENSIVE METABOLIC PANEL
Albumin: 3.4 g/dL — ABNORMAL LOW (ref 3.5–5.2)
Alkaline Phosphatase: 73 U/L (ref 39–117)
BUN: 6 mg/dL (ref 6–23)
Chloride: 107 mEq/L (ref 96–112)
Creatinine, Ser: 1.03 mg/dL (ref 0.50–1.10)
GFR calc Af Amer: 72 mL/min — ABNORMAL LOW (ref >90)
Glucose, Bld: 112 mg/dL — ABNORMAL HIGH (ref 70–99)
Potassium: 3.7 mEq/L (ref 3.5–5.1)
Total Bilirubin: 0.2 mg/dL — ABNORMAL LOW (ref 0.3–1.2)

## 2013-03-01 MED ORDER — PREDNISONE 10 MG PO TABS: 20.0000 mg | ORAL_TABLET | Freq: Two times a day (BID) | ORAL | Status: DC

## 2013-03-01 MED ORDER — KETOROLAC TROMETHAMINE 60 MG/2ML IM SOLN
60.0000 mg | Freq: Once | INTRAMUSCULAR | Status: AC
  Administered 2013-03-01: 60 mg via INTRAMUSCULAR
  Filled 2013-03-01: qty 2

## 2013-03-01 NOTE — ED Notes (Signed)
Fell yesterday, lt leg "gave out"  Had stroke in Feb 2014.  Since then mult falls.  Went to Urgent care today and advised to come to ER.Pain rt back and lt leg numb.    Alert, Pt says she is taking a  Blood thinner.Clopidogrel   When fell yesterday, struck her head, but no LOC.

## 2013-03-01 NOTE — ED Provider Notes (Signed)
History     This chart was scribed for [name], MD, MD by Smitty Pluck, ED Scribe. The patient was seen in room APA04/APA04 and the patient's care was started at 3:44 PM.   CSN: [id number]  Arrival date & time 03/01/13  1409      Chief Complaint  Patient presents with  . Fall    The history is provided by the patient and medical records. No language interpreter was used.   Sheryl Hall is a 52 y.o. female who presents to the Emergency Department sent from [facility] [place] for further evaluation complaining of throbbing, left leg pain suddenly onset after fall 1 day ago. She mentions that yesterday when she fell she hit her head but denies LOC. She mentions having lower back pain and numbness in lower left leg. She reports that she has hx of stroke in 12/29/2012 and has had weakness in left leg since stroke. She states that since her stroke she has been falling frequently. Pt reports that she takes clopidogrel. Pt denies fever, chills, nausea, vomiting, diarrhea, weakness, cough, SOB and any other pain.   Dr. Durwin Nora is PCP  Past Medical History  Diagnosis Date  . PTSD (post-traumatic stress disorder)   . Bipolar 1 disorder   . Heart murmur   . Diverticulosis of colon   . Keratosis, actinic   . Family hx of colon cancer     age 36-father  . GERD (gastroesophageal reflux disease)   . IBS (irritable bowel syndrome)   . Chronic nausea   . S/P endoscopy 05/30/2010    Dr Rourk-> non--critical Schatzki's ring, s/p 26F dilation  . S/P colonoscopy 05/30/2010    LAX sphincter tone, anal papilla, left-sided diverticulosis, normal random biopsies,, 1 polyp-TA  . Polysubstance abuse   . Anxiety   . Depression   . Tubular adenoma of colon 05/30/2010    Next colonoscopy 05/2015  . Hyperthyroidism   . Stroke     Past Surgical History  Procedure Laterality Date  . External ear surgery    . Neck surgery    . Hydrogen breath test  12/11/2011    Could not complete due to vomiting. Procedure: HYDROGEN  BREATH TEST;  Surgeon: Corbin Ade, MD;  Location: [facility];  Service: Endoscopy;  Laterality: N/A;  for Bacterial Overgrowth/8:30    Family History  Problem Relation Age of Onset  . Diabetes Mother   . Hypertension Mother   . Colon cancer Father 75  . Hypertension Father   . Hypertension Sister   . Heart attack Brother     History  Substance Use Topics  . Smoking status: Current Every Day Smoker -- 0.50 packs/day    Types: Cigarettes  . Smokeless tobacco: Not on file  . Alcohol Use: No    OB History   Grav Para Term Preterm Abortions TAB SAB Ect Mult Living   2 1 1  1  1   1       Review of Systems 10 Systems reviewed and all are negative for acute change except as noted in the HPI.   Allergies  Review of patient's allergies indicates no known allergies.  Home Medications   Current Outpatient Rx  Name  Route  Sig  Dispense  Refill  . albuterol (PROVENTIL HFA;VENTOLIN HFA) 108 (90 BASE) MCG/ACT inhaler   Inhalation   Inhale 2 puffs into the lungs every 6 (six) hours as needed. For shortness of breath          .  amoxicillin-clavulanate (AUGMENTIN) 875-125 MG per tablet   Oral   Take 1 tablet by mouth every 12 (twelve) hours.   14 tablet   0     Please take after eating.   Marland Kitchen aspirin 81 MG tablet   Oral   Take 81 mg by mouth daily.         . budesonide-formoterol (SYMBICORT) 160-4.5 MCG/ACT inhaler   Inhalation   Inhale 2 puffs into the lungs 2 (two) times daily.         . clonazePAM (KLONOPIN) 0.5 MG tablet   Oral   Take 0.5 mg by mouth 3 (three) times daily.          . clopidogrel (PLAVIX) 75 MG tablet   Oral   Take 75 mg by mouth daily.         Marland Kitchen dexlansoprazole (DEXILANT) 60 MG capsule   Oral   Take 60 mg by mouth daily.           . folic acid (FOLVITE) 400 MCG tablet   Oral   Take 400 mcg by mouth daily.         Marland Kitchen HYDROcodone-acetaminophen (NORCO) 5-325 MG per tablet   Oral   Take 1 tablet by mouth every 4 (four) hours  as needed for pain.   12 tablet   0   . EXPIRED: hyoscyamine (LEVBID) 0.375 MG 12 hr tablet   Oral   Take 1 tablet (0.375 mg total) by mouth every 12 (twelve) hours as needed for cramping.   60 tablet   3   . lamoTRIgine (LAMICTAL) 100 MG tablet   Oral   Take 100 mg by mouth daily. Takes once daily also with 75mg  dose which is TID         . lamoTRIgine (LAMICTAL) 25 MG tablet   Oral   Take 75 mg by mouth 3 (three) times daily. Takes 75mg  dose three x day along with 100mg  dose once daily         . levothyroxine (SYNTHROID, LEVOTHROID) 50 MCG tablet   Oral   Take 50 mcg by mouth daily.           Marland Kitchen lithium carbonate 300 MG capsule   Oral   Take 600 mg by mouth 2 (two) times daily with a meal.          . metoCLOPramide (REGLAN) 10 MG tablet   Oral   Take 1 tablet (10 mg total) by mouth 2 (two) times daily before a meal.         . Probiotic Product (PROBIOTIC PO)   Oral   Take 1 tablet by mouth daily.          . traZODone (DESYREL) 100 MG tablet   Oral   Take 100 mg by mouth at bedtime.             BP 137/77  Pulse 62  Temp(Src) 98.2 F (36.8 C) (Oral)  Resp 18  Ht 5\' 5"  (1.651 m)  Wt 163 lb (73.936 kg)  BMI 27.12 kg/m2  SpO2 98%  Physical Exam  Nursing note and vitals reviewed. Constitutional: She is oriented to person, place, and time. She appears well-developed and well-nourished. No distress.  HENT:  Head: Normocephalic and atraumatic.  Eyes: EOM are normal. Pupils are equal, round, and reactive to light.  Neck: Normal range of motion. Neck supple. No tracheal deviation present.  Cardiovascular: Normal rate, regular rhythm and normal heart sounds.   Pulmonary/Chest: Effort  normal. No respiratory distress.  Abdominal: Soft. She exhibits no distension.  Musculoskeletal: Normal range of motion.  There is tenderness to palpation in soft tissues of left lumbar region. There is no bony tenderness or stepoff.    The left lower extremity appears  grossly normal. There is no swelling or edema.   Neurological: She is alert and oriented to person, place, and time.  DTRs are 3+ and equal bilateral lower extremities. Strength is 5/5 in bilateral lower extremities.  She is able to ambulate, however her gait is somewhat unsteady.   Skin: Skin is warm and dry.  Psychiatric: She has a normal mood and affect. Her behavior is normal.    ED Course  Procedures (including critical care time) DIAGNOSTIC STUDIES: Oxygen Saturation is 98% on room air, normal by my interpretation.    COORDINATION OF CARE: 3:46 PM Discussed ED treatment with pt and pt agrees.  3:58 PM Ordered:  Medications  ketorolac (TORADOL) injection 60 mg (not administered)       Labs Reviewed - No data to display No results found.   No diagnosis found.    MDM  The patient presents here with complaints of numbness, pain in the left leg.  She has a history of cva in the past but is also complaining of neck pain.  She denies any bowel or bladder complaints but tells me her left leg is giving out on her.  A ct of the head was obtained as she told me she fell several days ago and has had a headache ever since.  This showed no acute change.  I also obtained an mri of the lumbar spine as she was reporting her leg was "giving out on her" and caused her to fall several times.  This showed foraminal stenosis and spinal stenosis but no emergently surgical issues.  At this point, I believe she is suitable for discharge.  As I do not have a definite cause of her leg numbness, I will recommend she follow up with her pcp to discuss further testing.  She will also be given prednisone for what appears to be radicular back pain.    I personally performed the services described in this documentation, which was scribed in my presence. The recorded information has been reviewed and is accurate.         [name], MD 03/01/13 [phone]

## 2013-03-04 ENCOUNTER — Encounter: Payer: Self-pay | Admitting: Family Medicine

## 2013-03-04 DIAGNOSIS — F329 Major depressive disorder, single episode, unspecified: Secondary | ICD-10-CM | POA: Insufficient documentation

## 2013-03-04 DIAGNOSIS — E0590 Thyrotoxicosis, unspecified without thyrotoxic crisis or storm: Secondary | ICD-10-CM | POA: Insufficient documentation

## 2013-03-04 DIAGNOSIS — F319 Bipolar disorder, unspecified: Secondary | ICD-10-CM | POA: Insufficient documentation

## 2013-03-04 DIAGNOSIS — F4310 Post-traumatic stress disorder, unspecified: Secondary | ICD-10-CM | POA: Insufficient documentation

## 2013-03-04 DIAGNOSIS — I639 Cerebral infarction, unspecified: Secondary | ICD-10-CM | POA: Insufficient documentation

## 2013-03-04 DIAGNOSIS — Z87898 Personal history of other specified conditions: Secondary | ICD-10-CM | POA: Insufficient documentation

## 2013-03-04 DIAGNOSIS — E785 Hyperlipidemia, unspecified: Secondary | ICD-10-CM | POA: Insufficient documentation

## 2013-03-04 DIAGNOSIS — F419 Anxiety disorder, unspecified: Secondary | ICD-10-CM | POA: Insufficient documentation

## 2013-03-04 DIAGNOSIS — E538 Deficiency of other specified B group vitamins: Secondary | ICD-10-CM | POA: Insufficient documentation

## 2013-03-07 ENCOUNTER — Ambulatory Visit (INDEPENDENT_AMBULATORY_CARE_PROVIDER_SITE_OTHER): Payer: Medicaid Other | Admitting: Physician Assistant

## 2013-03-07 ENCOUNTER — Encounter: Payer: Self-pay | Admitting: Physician Assistant

## 2013-03-07 VITALS — BP 122/70 | HR 60 | Temp 97.7°F | Resp 16 | Ht 64.0 in | Wt 178.0 lb

## 2013-03-07 DIAGNOSIS — I6350 Cerebral infarction due to unspecified occlusion or stenosis of unspecified cerebral artery: Secondary | ICD-10-CM

## 2013-03-07 DIAGNOSIS — I639 Cerebral infarction, unspecified: Secondary | ICD-10-CM

## 2013-03-07 DIAGNOSIS — M545 Low back pain: Secondary | ICD-10-CM | POA: Insufficient documentation

## 2013-03-07 NOTE — Progress Notes (Signed)
Patient ID: MARGREAT WIDENER MRN: [id number], DOB: [birth year], 52 y.o. Date of Encounter: 03/07/2013, 12:56 PM    Chief Complaint:  Chief Complaint  Patient presents with  . ER followup    confused about meds   not taking pravastatin     HPI: 52 y.o. year old female presents for f/u OV after recent ER visit. Says she fell.Bumped right front of head and had low back pain. Had CT of head and MRI of lumbar spine. Was treated with oral prescription of prednisone. Says still has some achy pain in bilateral low back. No pain, numbness, tingling down either leg. No bowel or bladder incontinence. No headache or new neurologic changes.  Home Meds: Current Outpatient Prescriptions on File Prior to Visit  Medication Sig Dispense Refill  . albuterol (PROVENTIL HFA;VENTOLIN HFA) 108 (90 BASE) MCG/ACT inhaler Inhale 2 puffs into the lungs every 6 (six) hours as needed. For shortness of breath       . aspirin 81 MG tablet Take 81 mg by mouth daily.      . budesonide-formoterol (SYMBICORT) 160-4.5 MCG/ACT inhaler Inhale 2 puffs into the lungs 2 (two) times daily.      . clonazePAM (KLONOPIN) 1 MG tablet Take 0.5-1 mg by mouth 3 (three) times daily as needed for anxiety (one tab twice daily and one-half in the evening).      . clopidogrel (PLAVIX) 75 MG tablet Take 75 mg by mouth daily.      Marland Kitchen dexlansoprazole (DEXILANT) 60 MG capsule Take 60 mg by mouth every evening.       . folic acid (FOLVITE) 400 MCG tablet Take 400 mcg by mouth daily.      Marland Kitchen HYDROcodone-acetaminophen (NORCO) 5-325 MG per tablet Take 1 tablet by mouth every 4 (four) hours as needed for pain.  12 tablet  0  . lamoTRIgine (LAMICTAL) 100 MG tablet Take 100 mg by mouth daily. Takes once daily also with 75mg  dose which is TID      . lamoTRIgine (LAMICTAL) 25 MG tablet Take 75 mg by mouth 3 (three) times daily. Takes 75mg  dose three x day along with 100mg  dose once daily      . levothyroxine (SYNTHROID, LEVOTHROID) 50 MCG tablet Take 50 mcg  by mouth every evening.       . predniSONE (DELTASONE) 10 MG tablet Take 2 tablets (20 mg total) by mouth 2 (two) times daily.  20 tablet  0  . Probiotic Product (PROBIOTIC PO) Take 1 tablet by mouth daily.       . traZODone (DESYREL) 100 MG tablet Take 100 mg by mouth at bedtime.         No current facility-administered medications on file prior to visit.    Allergies: No Known Allergies    Review of Systems: Constitutional: negative for chills, fever, night sweats, weight changes, or fatigue  HEENT: negative for vision changes, hearing loss, congestion, rhinorrhea, ST, epistaxis, or sinus pressure Cardiovascular: negative for chest pain or palpitations Respiratory: negative for hemoptysis, wheezing, shortness of breath, or cough Abdominal: negative for abdominal pain, nausea, vomiting, diarrhea, or constipation Dermatological: negative for rash Neurologic: negative for headache, dizziness, or syncope    Physical Exam: Blood pressure 122/70, pulse 60, temperature 97.7 F (36.5 C), temperature source Oral, resp. rate 16, height 5\' 4"  (1.626 m), weight 178 lb (80.74 kg)., Body mass index is 30.54 kg/(m^2). General: Well developed, well nourished,WF. in no acute distress. HEENT: Normocephalic, atraumatic, eyes without discharge, sclera non-icteric,  nares are without discharge. Bilateral auditory canals clear, TM's are without perforation, pearly grey and translucent with reflective cone of light bilaterally. Oral cavity moist, posterior pharynx without exudate, erythema, peritonsillar abscess, or post nasal drip.  Neck: Supple. No thyromegaly. Full ROM. No lymphadenopathy. Lungs: Clear bilaterally to auscultation without wheezes, rales, or rhonchi. Breathing is unlabored. Heart: RRR with S1 S2. No murmurs, rubs, or gallops appreciated. Abdomen: Soft, non-tender, non-distended with normoactive bowel sounds. No hepatomegaly. No rebound/guarding. No obvious abdominal masses. Msk:  Strength  and tone normal for age. Extremities/Skin: Warm and dry. No clubbing or cyanosis. No edema. No rashes or suspicious lesions. Neuro: Alert and oriented X 3. Moves all extremities spontaneously. Gait is normal. CNII-XII grossly in tact. Psych:  Responds to questions appropriately with a normal affect.    ASSESSMENT AND PLAN:  52 y.o. year old female with  1. Low back pain S/P MRI 03/01/13-I reviewed entire report. Some disc bulge at L2-L3, L3-L4, L4-L5, L5-S1. But no nerve encroachment etc.  Symptoms are much improved and stable. Complete Prednisone as directed by ER.  She still has questions about what the neurologist recommended. Says he was in and out very quickly and did not expaling anyting. See my LOV note-I went ove rtest results etc and answered questions. However, pt still concerned. Says neuro told her to stop a medicine-thought it was pravastatin but not sure. Also, not sure if it is safe for her to drive or not. I will refer her to another neurologist for f/u.   [address] [place], [place], [facility] 03/07/2013 12:56 PM

## 2013-03-24 ENCOUNTER — Telehealth: Payer: Self-pay | Admitting: Neurology

## 2013-03-24 ENCOUNTER — Ambulatory Visit (INDEPENDENT_AMBULATORY_CARE_PROVIDER_SITE_OTHER): Payer: Medicaid Other | Admitting: Neurology

## 2013-03-24 ENCOUNTER — Other Ambulatory Visit: Payer: Medicaid Other | Admitting: Radiology

## 2013-03-24 ENCOUNTER — Encounter: Payer: Self-pay | Admitting: Neurology

## 2013-03-24 VITALS — BP 123/81 | HR 71 | Ht 65.75 in | Wt 168.0 lb

## 2013-03-24 DIAGNOSIS — I6350 Cerebral infarction due to unspecified occlusion or stenosis of unspecified cerebral artery: Secondary | ICD-10-CM

## 2013-03-24 DIAGNOSIS — I639 Cerebral infarction, unspecified: Secondary | ICD-10-CM

## 2013-03-24 DIAGNOSIS — F05 Delirium due to known physiological condition: Secondary | ICD-10-CM | POA: Insufficient documentation

## 2013-03-24 MED ORDER — LEVETIRACETAM 250 MG PO TABS: 250.0000 mg | ORAL_TABLET | Freq: Two times a day (BID) | ORAL | Status: DC

## 2013-03-24 NOTE — Telephone Encounter (Signed)
I called the patient developed a message. The EEG study was abnormal, and I'll get her started on low-dose Keppra. I will call a prescription in for her.

## 2013-03-24 NOTE — Progress Notes (Signed)
Reason for visit: Stroke  Sheryl Hall is a 52 y.o. female  History of present illness:  Sheryl Hall is a 52 year old right-handed white female with a history of tobacco abuse and bipolar disorder. The patient had an event in February of 2014 where she felt poorly, and she suddenly fell over or slumped over. The patient did not feel well for about 2 days, and she then she eventually recovered. Following this, the patient underwent a CT scan of the brain that showed a right parietal abnormality. MRI the brain was done, and this showed evidence of an old right parietal stroke that was cortical in nature. MRA was not done. The patient underwent a carotid doppler study that was unremarkable, and a 2-D echocardiogram showed an ejection fraction of 60-65% without evidence of a source of cardiac emboli. The patient has been placed on aspirin and Plavix, and she was sent to this office for an evaluation. The patient reports no focal numbness or weakness of the face, arms, or legs. The patient not had any episodes of slumping over since the one in February 2014. The patient denies any balance issues, or problems controlling the bowels or the bladder.  Past Medical History  Diagnosis Date  . PTSD (post-traumatic stress disorder)   . Bipolar 1 disorder   . Heart murmur   . Diverticulosis of colon   . Keratosis, actinic   . Family hx of colon cancer     age 102-father  . GERD (gastroesophageal reflux disease)   . IBS (irritable bowel syndrome)   . Chronic nausea   . S/P endoscopy 05/30/2010    Dr Rourk-> non--critical Schatzki's ring, s/p 96F dilation  . S/P colonoscopy 05/30/2010    LAX sphincter tone, anal papilla, left-sided diverticulosis, normal random biopsies,, 1 polyp-TA  . Polysubstance abuse   . Tubular adenoma of colon 05/30/2010    Next colonoscopy 05/2015  . Anxiety   . Depression   . Hyperthyroidism   . Hyperlipidemia   . Folate deficiency   . Hx of abnormal Pap smear   . Low back  pain 03/01/2013    MRI with multiple levels of disc bulge  . Stroke     Right parietal    Past Surgical History  Procedure Laterality Date  . External ear surgery    . Neck surgery    . Hydrogen breath test  12/11/2011    Could not complete due to vomiting. Procedure: HYDROGEN BREATH TEST;  Surgeon: Corbin Ade, MD;  Location: [facility];  Service: Endoscopy;  Laterality: N/A;  for Bacterial Overgrowth/8:30    Family History  Problem Relation Age of Onset  . Diabetes Mother   . Hypertension Mother   . Colon cancer Father 52  . Hypertension Father   . Hypertension Sister   . Heart attack Brother     Social history:  reports that she has been smoking Cigarettes.  She has been smoking about 0.50 packs per day. She does not have any smokeless tobacco history on file. She reports that she uses illicit drugs. She reports that she does not drink alcohol.  Medications:  Current Outpatient Prescriptions on File Prior to Visit  Medication Sig Dispense Refill  . albuterol (PROVENTIL HFA;VENTOLIN HFA) 108 (90 BASE) MCG/ACT inhaler Inhale 2 puffs into the lungs every 6 (six) hours as needed. For shortness of breath       . aspirin 81 MG tablet Take 81 mg by mouth daily.      Marland Kitchen  budesonide-formoterol (SYMBICORT) 160-4.5 MCG/ACT inhaler Inhale 2 puffs into the lungs 2 (two) times daily.      . clonazePAM (KLONOPIN) 1 MG tablet Take 0.5-1 mg by mouth 3 (three) times daily as needed for anxiety (one tab twice daily and one-half in the evening).      Marland Kitchen dexlansoprazole (DEXILANT) 60 MG capsule Take 60 mg by mouth every evening.       . folic acid (FOLVITE) 400 MCG tablet Take 400 mcg by mouth daily.      Marland Kitchen lamoTRIgine (LAMICTAL) 100 MG tablet Take 100 mg by mouth daily. Takes once daily also with 75mg  dose which is TID      . lamoTRIgine (LAMICTAL) 25 MG tablet Take 75 mg by mouth 3 (three) times daily. Takes 75mg  dose three x day along with 100mg  dose once daily      . levothyroxine (SYNTHROID,  LEVOTHROID) 50 MCG tablet Take 50 mcg by mouth every evening.       . Probiotic Product (PROBIOTIC PO) Take 1 tablet by mouth daily.       . traZODone (DESYREL) 100 MG tablet Take 100 mg by mouth at bedtime.         No current facility-administered medications on file prior to visit.    Allergies: No Known Allergies  ROS:  Out of a complete 14 system review of symptoms, the patient complains only of the following symptoms, and all other reviewed systems are negative.  Weight gain Heart murmur Hearing loss, left Easy bruising, easy bleeding Constipation Increased thirst Skin sensitivity Memory loss, confusion Difficulty swallowing, anxiety Change in appetite, racing thoughts Restless legs  Blood pressure 123/81, pulse 71, height 5' 5.75" (1.67 m), weight 168 lb (76.204 kg).  Physical Exam  General: The patient is alert and cooperative at the time of the examination.  Head: Pupils are equal, round, and reactive to light. Discs are flat bilaterally.  Neck: The neck is supple, no carotid bruits are noted.  Respiratory: The respiratory examination reveals bilateral wheezing.  Cardiovascular: The cardiovascular examination reveals a regular rate and rhythm, no obvious murmurs or rubs are noted.  Skin: Extremities are without significant edema.  Neurologic Exam  Mental status:  Cranial nerves: Facial symmetry is present. There is good sensation of the face to pinprick and soft touch bilaterally. The strength of the facial muscles and the muscles to head turning and shoulder shrug are normal bilaterally. Speech is well enunciated, no aphasia or dysarthria is noted. Extraocular movements are full. Visual fields are full.  Motor: The motor testing reveals 5 over 5 strength of all 4 extremities. Good symmetric motor tone is noted throughout.  Sensory: Sensory testing is intact to pinprick, soft touch, vibration sensation, and position sense on all 4 extremities. No evidence of  extinction is noted.  Coordination: Cerebellar testing reveals good finger-nose-finger and heel-to-shin bilaterally.  Gait and station: Gait is normal. Tandem gait is normal. Romberg is negative. No drift is seen.  Reflexes: Deep tendon reflexes are symmetric and normal bilaterally. Toes are downgoing bilaterally.   Assessment/Plan:  1. Right parietal stroke, chronic  2. Episode of "slumping over"  The patient has had MRI evaluation the brain that shows evidence of a very chronic right parietal stroke. The time of onset is not clear. This stroke likely has been present for several years. The recent event of "slumping over"may have represented a seizure event. The patient will be set up for EEG evaluation. MRA of the head and neck will  be done, and a hypercoagulable state panel will be ordered. The patient will have a transcranial Doppler bubble study. The patient will stop the Plavix, and remain on aspirin. I have asked the patient to stop smoking. The patient will followup in 3 months. The patient apparently has a history of cocaine abuse in the past, but she currently indicates that she is not using drugs.  Addendum: EEG study was done today, and this shows bitemporal, independent dysrhythmic theta activity. This study suggests a lowered seizure threshold, and the patient will be started on low-dose Keppra, taking 250 mg twice daily.   Marlan Palau MD 03/24/2013 8:20 PM  [facility] [address] [place], [place] [address]  Phone [phone] Fax [phone]

## 2013-03-24 NOTE — Procedures (Signed)
Jenniah Bhavsar is a 52 year old patient with a history of a transient confusional episode that occurred in February 2014. The patient has been found to have a chronic appearing right parietal cortical infarct. There is no clinical correlate with the onset of this stroke. The patient is being evaluated for possible seizures as an etiology for the confusional event.  This is a routine EEG. No skull defects are noted. Medications include albuterol inhaler, aspirin, Symbicort, Dexilant, Lamictal, Synthroid, Latuda, and trazodone.  EEG classification: Dysrhythmia grade 2 bitemporal  Description of the recording: The background rhythms of this recording consists of a fairly well modulated medium amplitude alpha rhythm of 10 Hz that is reactive to eye opening and closure. As the record progresses, intermittent dysrhythmic theta activity is seen emanating from the temporal lobes, and this activity appears to be bitemporal in nature, commonly independent. Sharp transients are seen predominantly in the right temporal area, occasionally on the left. No electrographic seizures were recorded at any time. Photic stimulation is performed, and results in a bilateral and symmetric photic driving response. Hyperventilation was also performed, and results in a minimal buildup of background rhythm activities without significant slowing seen. EKG monitor shows no evidence of cardiac rhythm abnormalities with a heart rate of 72.  Impression: This is an abnormal EEG recording secondary to bihemispheric dysrhythmic theta activity. This study suggests a lowered seizure threshold, and suggests a bihemispheric focus. No electrographic seizures were recorded.

## 2013-03-29 ENCOUNTER — Other Ambulatory Visit: Payer: Self-pay | Admitting: Neurology

## 2013-03-29 DIAGNOSIS — I6350 Cerebral infarction due to unspecified occlusion or stenosis of unspecified cerebral artery: Secondary | ICD-10-CM

## 2013-03-30 ENCOUNTER — Other Ambulatory Visit: Payer: Self-pay | Admitting: Neurology

## 2013-03-31 LAB — LUPUS ANTICOAGULANT PANEL

## 2013-03-31 LAB — FACTOR 5 LEIDEN

## 2013-03-31 LAB — CARDIOLIPIN ANTIBODIES, IGG, IGM, IGA: Anticardiolipin IgG: 2 GPL U/mL (ref <23)

## 2013-03-31 NOTE — Progress Notes (Signed)
Quick Note:  I called and left a message for the patient that her lab results were normal. ______ 

## 2013-04-06 ENCOUNTER — Ambulatory Visit
Admission: RE | Admit: 2013-04-06 | Discharge: 2013-04-06 | Disposition: A | Discharge status: Home / Self Care | Payer: Medicaid Other | Source: Ambulatory Visit | Attending: Neurology | Admitting: Neurology

## 2013-04-06 ENCOUNTER — Inpatient Hospital Stay: Admission: RE | Admit: 2013-04-06 | Payer: Medicaid Other | Source: Ambulatory Visit

## 2013-04-06 DIAGNOSIS — F05 Delirium due to known physiological condition: Secondary | ICD-10-CM

## 2013-04-06 DIAGNOSIS — I639 Cerebral infarction, unspecified: Secondary | ICD-10-CM

## 2013-04-06 DIAGNOSIS — I6350 Cerebral infarction due to unspecified occlusion or stenosis of unspecified cerebral artery: Secondary | ICD-10-CM

## 2013-04-06 MED ORDER — GADOBENATE DIMEGLUMINE 529 MG/ML IV SOLN
15.0000 mL | Freq: Once | INTRAVENOUS | Status: AC | PRN
  Administered 2013-04-06: 15 mL via INTRAVENOUS

## 2013-04-07 ENCOUNTER — Telehealth: Payer: Self-pay | Admitting: Neurology

## 2013-04-07 NOTE — Telephone Encounter (Signed)
I called patient. I left a message. The MRA of the neck was unremarkable, no significant blockages seen. The patient had an abnormal EEG study, and she is now on Keppra. We will followup in the office.

## 2013-04-11 ENCOUNTER — Telehealth: Payer: Self-pay | Admitting: *Deleted

## 2013-04-11 NOTE — Telephone Encounter (Signed)
I called and left a message for the patient to callback to the office concerning her message on Monday 04-11-13

## 2013-04-19 ENCOUNTER — Telehealth: Payer: Self-pay | Admitting: Physician Assistant

## 2013-04-19 NOTE — Telephone Encounter (Signed)
Pt called and instructed to stop cholesterol medication for 2 weeks.  Then call us back and let us know if leg cramps have resolved.  Provider will then decide what to do about cholesterol.  Pt acknowledged understanding

## 2013-04-19 NOTE — Telephone Encounter (Signed)
Stop the cholesterol medicine for 2 weeks. Then call us to let us know whether the leg cramps resolve or not then we can decide what to do next.

## 2013-05-06 ENCOUNTER — Other Ambulatory Visit: Payer: Medicaid Other

## 2013-05-06 ENCOUNTER — Ambulatory Visit: Payer: Medicaid Other | Admitting: Neurology

## 2013-05-25 ENCOUNTER — Encounter (HOSPITAL_COMMUNITY): Payer: Self-pay | Admitting: *Deleted

## 2013-05-25 ENCOUNTER — Emergency Department (HOSPITAL_COMMUNITY)
Admission: EM | Admit: 2013-05-25 | Discharge: 2013-05-25 | Disposition: A | Discharge status: Home / Self Care | Payer: Medicaid Other | Attending: Emergency Medicine | Admitting: Emergency Medicine

## 2013-05-25 ENCOUNTER — Emergency Department (HOSPITAL_COMMUNITY): Payer: Medicaid Other

## 2013-05-25 DIAGNOSIS — K219 Gastro-esophageal reflux disease without esophagitis: Secondary | ICD-10-CM | POA: Insufficient documentation

## 2013-05-25 DIAGNOSIS — Z8679 Personal history of other diseases of the circulatory system: Secondary | ICD-10-CM | POA: Insufficient documentation

## 2013-05-25 DIAGNOSIS — R5381 Other malaise: Secondary | ICD-10-CM | POA: Insufficient documentation

## 2013-05-25 DIAGNOSIS — F4310 Post-traumatic stress disorder, unspecified: Secondary | ICD-10-CM | POA: Insufficient documentation

## 2013-05-25 DIAGNOSIS — F411 Generalized anxiety disorder: Secondary | ICD-10-CM | POA: Insufficient documentation

## 2013-05-25 DIAGNOSIS — Z7982 Long term (current) use of aspirin: Secondary | ICD-10-CM | POA: Insufficient documentation

## 2013-05-25 DIAGNOSIS — F319 Bipolar disorder, unspecified: Secondary | ICD-10-CM | POA: Insufficient documentation

## 2013-05-25 DIAGNOSIS — Z8719 Personal history of other diseases of the digestive system: Secondary | ICD-10-CM | POA: Insufficient documentation

## 2013-05-25 DIAGNOSIS — R55 Syncope and collapse: Secondary | ICD-10-CM | POA: Insufficient documentation

## 2013-05-25 DIAGNOSIS — Z8673 Personal history of transient ischemic attack (TIA), and cerebral infarction without residual deficits: Secondary | ICD-10-CM | POA: Insufficient documentation

## 2013-05-25 DIAGNOSIS — R011 Cardiac murmur, unspecified: Secondary | ICD-10-CM | POA: Insufficient documentation

## 2013-05-25 DIAGNOSIS — Z862 Personal history of diseases of the blood and blood-forming organs and certain disorders involving the immune mechanism: Secondary | ICD-10-CM | POA: Insufficient documentation

## 2013-05-25 DIAGNOSIS — Z8639 Personal history of other endocrine, nutritional and metabolic disease: Secondary | ICD-10-CM | POA: Insufficient documentation

## 2013-05-25 DIAGNOSIS — E0590 Thyrotoxicosis, unspecified without thyrotoxic crisis or storm: Secondary | ICD-10-CM | POA: Insufficient documentation

## 2013-05-25 DIAGNOSIS — Z872 Personal history of diseases of the skin and subcutaneous tissue: Secondary | ICD-10-CM | POA: Insufficient documentation

## 2013-05-25 DIAGNOSIS — E538 Deficiency of other specified B group vitamins: Secondary | ICD-10-CM | POA: Insufficient documentation

## 2013-05-25 DIAGNOSIS — F3289 Other specified depressive episodes: Secondary | ICD-10-CM | POA: Insufficient documentation

## 2013-05-25 DIAGNOSIS — R51 Headache: Secondary | ICD-10-CM | POA: Insufficient documentation

## 2013-05-25 DIAGNOSIS — Z79899 Other long term (current) drug therapy: Secondary | ICD-10-CM | POA: Insufficient documentation

## 2013-05-25 DIAGNOSIS — F329 Major depressive disorder, single episode, unspecified: Secondary | ICD-10-CM | POA: Insufficient documentation

## 2013-05-25 DIAGNOSIS — F17200 Nicotine dependence, unspecified, uncomplicated: Secondary | ICD-10-CM | POA: Insufficient documentation

## 2013-05-25 DIAGNOSIS — Z8659 Personal history of other mental and behavioral disorders: Secondary | ICD-10-CM | POA: Insufficient documentation

## 2013-05-25 LAB — BASIC METABOLIC PANEL
BUN: 6 mg/dL (ref 6–23)
CO2: 30 mEq/L (ref 19–32)
Calcium: 9.4 mg/dL (ref 8.4–10.5)
GFR calc non Af Amer: 57 mL/min — ABNORMAL LOW (ref >90)
Glucose, Bld: 99 mg/dL (ref 70–99)
Potassium: 3.4 mEq/L — ABNORMAL LOW (ref 3.5–5.1)
Sodium: 141 mEq/L (ref 135–145)

## 2013-05-25 LAB — GLUCOSE, CAPILLARY
Glucose-Capillary: 66 mg/dL — ABNORMAL LOW (ref 70–99)
Glucose-Capillary: 108 mg/dL — ABNORMAL HIGH (ref 70–99)

## 2013-05-25 LAB — CBC WITH DIFFERENTIAL/PLATELET
Eosinophils Absolute: 0.1 10*3/uL (ref 0.0–0.7)
Eosinophils Relative: 2 % (ref 0–5)
Hemoglobin: 14.5 g/dL (ref 12.0–15.0)
Lymphocytes Relative: 34 % (ref 12–46)
Lymphs Abs: 2.3 10*3/uL (ref 0.7–4.0)
MCH: 30.4 pg (ref 26.0–34.0)
MCV: 90.1 fL (ref 78.0–100.0)
Monocytes Relative: 6 % (ref 3–12)
RBC: 4.77 MIL/uL (ref 3.87–5.11)
WBC: 6.6 10*3/uL (ref 4.0–10.5)

## 2013-05-25 MED ORDER — NAPROXEN 500 MG PO TABS: 500.0000 mg | ORAL_TABLET | Freq: Two times a day (BID) | ORAL | Status: DC

## 2013-05-25 MED ORDER — DEXTROSE 50 % IV SOLN
INTRAVENOUS | Status: AC
  Administered 2013-05-25: 25 mL via INTRAVENOUS
  Filled 2013-05-25: qty 50

## 2013-05-25 MED ORDER — METOCLOPRAMIDE HCL 5 MG/ML IJ SOLN
10.0000 mg | Freq: Once | INTRAMUSCULAR | Status: AC
  Administered 2013-05-25: 10 mg via INTRAVENOUS
  Filled 2013-05-25: qty 2

## 2013-05-25 MED ORDER — DIPHENHYDRAMINE HCL 50 MG/ML IJ SOLN
25.0000 mg | Freq: Once | INTRAMUSCULAR | Status: AC
  Administered 2013-05-25: 25 mg via INTRAVENOUS
  Filled 2013-05-25: qty 1

## 2013-05-25 MED ORDER — DEXTROSE 50 % IV SOLN: 25.0000 mL | Freq: Once | INTRAVENOUS | Status: AC

## 2013-05-25 MED ORDER — SODIUM CHLORIDE 0.9 % IV BOLUS (SEPSIS)
1000.0000 mL | Freq: Once | INTRAVENOUS | Status: AC
  Administered 2013-05-25: 1000 mL via INTRAVENOUS

## 2013-05-25 MED ORDER — KETOROLAC TROMETHAMINE 30 MG/ML IJ SOLN
30.0000 mg | Freq: Once | INTRAMUSCULAR | Status: AC
  Administered 2013-05-25: 30 mg via INTRAVENOUS
  Filled 2013-05-25: qty 1

## 2013-05-25 NOTE — ED Notes (Signed)
Attempted to get IV access x 2 without success, asked CN to assess for IV access

## 2013-05-25 NOTE — ED Notes (Signed)
Says she has felt faint intermittently for 2-3 days,  Hx of stroke  Feb 5th.  .  Alert, , ambulatory into triage

## 2013-05-25 NOTE — ED Notes (Signed)
Pt states that she has passed out multiple times over last 3 days, hx CVA in feb. 2014 per pt, weakness to left leg since CVA per pt., + nausea, denies vomiting, fevers or diarrhea

## 2013-05-25 NOTE — ED Provider Notes (Signed)
History    This chart was scribed for Sheryl Roller, MD by Quintella Reichert, ED scribe.  This patient was seen in room [facility] and the patient's care was started at 6:16 PM.   CSN: [id number]  Arrival date & time 05/25/13  1419    Chief Complaint  Patient presents with  . Loss of Consciousness    The history is provided by the patient. No language interpreter was used.     HPI Comments: HERBERTA Hall is a 52 y.o. female with h/o stroke, PTSD, and bipolar disorder who presents to the Emergency Department complaining of 2 days of recurrent syncopal episodes described as similar to those associated with her previous stroke, with accompanying headache and extremity weakness.  Her first episode occurred 2 days ago when she was sitting on her couch and developed a headache and nausea and subsequently lost consciousness.  She states "I kept sitting up and falling over."   She reports that these episodes have occurred several times since then and LOC lasts several minutes each time.  She also complains of a constant headache and weakness to her left leg.  She has been ambulatory but with difficulty.  Pt states that she experienced similar symptoms 5 months ago and on   12/29/12 she received a head CT that revealed a probable remote watershed distribution  Infarct in the right parietal lobe.  Pt is a current half-pack-a-day smoker.    Past Medical History  Diagnosis Date  . PTSD (post-traumatic stress disorder)   . Bipolar 1 disorder   . Heart murmur   . Diverticulosis of colon   . Keratosis, actinic   . Family hx of colon cancer     age 7-father  . GERD (gastroesophageal reflux disease)   . IBS (irritable bowel syndrome)   . Chronic nausea   . S/P endoscopy 05/30/2010    Dr Rourk-> non--critical Schatzki's ring, s/p 44F dilation  . S/P colonoscopy 05/30/2010    LAX sphincter tone, anal papilla, left-sided diverticulosis, normal random biopsies,, 1 polyp-TA  . Polysubstance abuse    . Tubular adenoma of colon 05/30/2010    Next colonoscopy 05/2015  . Anxiety   . Depression   . Hyperthyroidism   . Hyperlipidemia   . Folate deficiency   . Hx of abnormal Pap smear   . Low back pain 03/01/2013    MRI with multiple levels of disc bulge  . Stroke     Right parietal    Past Surgical History  Procedure Laterality Date  . External ear surgery    . Neck surgery    . Hydrogen breath test  12/11/2011    Could not complete due to vomiting. Procedure: HYDROGEN BREATH TEST;  Surgeon: Corbin Ade, MD;  Location: [facility];  Service: Endoscopy;  Laterality: N/A;  for Bacterial Overgrowth/8:30    Family History  Problem Relation Age of Onset  . Diabetes Mother   . Hypertension Mother   . Colon cancer Father 41  . Hypertension Father   . Hypertension Sister   . Heart attack Brother     History  Substance Use Topics  . Smoking status: Current Every Day Smoker -- 0.50 packs/day    Types: Cigarettes  . Smokeless tobacco: Not on file  . Alcohol Use: No    OB History   Grav Para Term Preterm Abortions TAB SAB Ect Mult Living   2 1 1  1  1    1  Review of Systems A complete 10 system review of systems was obtained and all systems are negative except as noted in the HPI and PMH.     Allergies  Review of patient's allergies indicates no known allergies.  Home Medications   Current Outpatient Rx  Name  Route  Sig  Dispense  Refill  . albuterol (PROVENTIL HFA;VENTOLIN HFA) 108 (90 BASE) MCG/ACT inhaler   Inhalation   Inhale 2 puffs into the lungs every 6 (six) hours as needed. For shortness of breath          . aspirin 81 MG tablet   Oral   Take 81 mg by mouth daily.         . budesonide-formoterol (SYMBICORT) 160-4.5 MCG/ACT inhaler   Inhalation   Inhale 2 puffs into the lungs 2 (two) times daily.         . clonazePAM (KLONOPIN) 1 MG tablet   Oral   Take 1 mg by mouth 2 (two) times daily.          Marland Kitchen dexlansoprazole (DEXILANT) 60 MG  capsule   Oral   Take 60 mg by mouth every evening.          . folic acid (FOLVITE) 400 MCG tablet   Oral   Take 400 mcg by mouth daily.         Marland Kitchen lamoTRIgine (LAMICTAL) 100 MG tablet   Oral   Take 200 mg by mouth daily.          Marland Kitchen levETIRAcetam (KEPPRA) 250 MG tablet   Oral   Take 1 tablet (250 mg total) by mouth every 12 (twelve) hours.   60 tablet   3   . levothyroxine (SYNTHROID, LEVOTHROID) 50 MCG tablet   Oral   Take 50 mcg by mouth every evening.          . Lurasidone HCl (LATUDA) 20 MG TABS   Oral   Take 20 mg by mouth daily.         . Probiotic Product (PROBIOTIC PO)   Oral   Take 1 tablet by mouth daily.          . traZODone (DESYREL) 100 MG tablet   Oral   Take 100 mg by mouth at bedtime.           . naproxen (NAPROSYN) 500 MG tablet   Oral   Take 1 tablet (500 mg total) by mouth 2 (two) times daily with a meal.   30 tablet   0    BP 146/73  Pulse 74  Temp(Src) 98.1 F (36.7 C) (Oral)  Resp 18  Ht 5' 5.75" (1.67 m)  Wt 168 lb (76.204 kg)  BMI 27.32 kg/m2  SpO2 100%  Physical Exam  Nursing note and vitals reviewed. Constitutional: She appears well-developed and well-nourished. No distress.  HENT:  Head: Normocephalic and atraumatic.  Mouth/Throat: Oropharynx is clear and moist. No oropharyngeal exudate.  Eyes: Conjunctivae and EOM are normal. Pupils are equal, round, and reactive to light. Right eye exhibits no discharge. Left eye exhibits no discharge. No scleral icterus.  Neck: Normal range of motion. Neck supple. No JVD present. No thyromegaly present.  Cardiovascular: Normal rate, regular rhythm, normal heart sounds and intact distal pulses.  Exam reveals no gallop and no friction rub.   No murmur heard. Pulmonary/Chest: Effort normal and breath sounds normal. No respiratory distress. She has no wheezes. She has no rales.  Abdominal: Soft. Bowel sounds are normal. She exhibits no  distension and no mass. There is no tenderness.   Musculoskeletal: Normal range of motion. She exhibits no edema and no tenderness.  Lymphadenopathy:    She has no cervical adenopathy.  Neurological: She is alert. Coordination normal.  Neurologic exam: Occasional stuttering speech not present when distracted by physical exam maneuver. Pupils equal round reactive to light, extraocular movements intact  Normal peripheral visual fields Cranial nerves III through XII normal including no facial droop Follows commands, moves all extremities x4, normal strength to bilateral upper and lower extremities at all major muscle groups including grip Sensation normal to light touch and pinprick Coordination intact, no limb ataxia, finger-nose-finger normal Rapid alternating movements normal No pronator drift Gait normal  Skin: Skin is warm and dry. No rash noted. No erythema.  Psychiatric: She has a normal mood and affect. Her behavior is normal.    ED Course  Procedures (including critical care time)  DIAGNOSTIC STUDIES: Oxygen Saturation is 100% on room air, normal by my interpretation.    COORDINATION OF CARE: 6:23 PM: Discussed treatment plan which includes pain medications and head CT.  Pt expressed understanding and agreed to plan.     Labs Reviewed  BASIC METABOLIC PANEL - Abnormal; Notable for the following:    Potassium 3.4 (*)    GFR calc non Af Amer 57 (*)    GFR calc Af Amer 66 (*)    All other components within normal limits  GLUCOSE, CAPILLARY - Abnormal; Notable for the following:    Glucose-Capillary 66 (*)    All other components within normal limits  GLUCOSE, CAPILLARY - Abnormal; Notable for the following:    Glucose-Capillary 108 (*)    All other components within normal limits  CBC WITH DIFFERENTIAL  TROPONIN I    Ct Head Wo Contrast  05/25/2013   *RADIOLOGY REPORT*  Clinical Data: Severe headache  CT HEAD WITHOUT CONTRAST  Technique:  Contiguous axial images were obtained from the base of the skull through the  vertex without contrast.  Comparison: Prior MRI from 04/06/2013 and CT from 03/01/2013  Findings: Encephalomalacia within the right parietal lobe is consistent with remote right parietal infarct, unchanged.  No acute intracranial hemorrhage or infarct identified.  There is no midline shift or mass lesion.  CSF containing spaces a are normal without hydrocephalus.  No extra-axial fluid collection.  Paranasal sinuses and mastoid air cells are clear.  IMPRESSION: Remote right parietal infarct.  No acute intracranial abnormality.   Original Report Authenticated By: Rise Mu, M.D.    1. Syncope   2. Headache     MDM  The patient's symptoms are remarkable for her headache and her stuttering occasional speech. She has no focal weakness or numbness, normal reflexes in her cranial nerves III through XII are otherwise intact. 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 At this time she does not have any significant focal neurologic deficits, her speech seems to be transient and is not present when she is distracted with physical exam technique. She is having a headache, this will be treated with medications, CT scan of the head will be ordered, her vital signs are otherwise unremarkable.  Her headaches may or may not have anything to do with her loss of consciousness. She has been having recurrent  syncopal events which are not explained fully based on her prior history. She does not have a significant cardiac history and is on multiple medications for her seizure disorder, PTSD, bipolar disorder. We'll obtain an EKG and perform cardiac monitoring, patient may need inpatient admission for syncopal events.  At this time the patient is well-appearing, vital signs are normal, labs have been overall unremarkable. She was  hypoglycemic at one point, she was not symptomatic, was given food in her symptoms remain stable,, I offered her admission to the hospital and recommended is that she has refused and will followup with her family doctor.  ED ECG REPORT  I personally interpreted this EKG   Date: 05/25/2013   Rate: 52  Rhythm: sinus bradycardia  QRS Axis: normal  Intervals: normal  ST/T Wave abnormalities: normal  Conduction Disutrbances:none  Narrative Interpretation:   Old EKG Reviewed: c/w 03/18/11   I personally performed the services described in this documentation, which was scribed in my presence. The recorded information has been reviewed and is accurate.      Sheryl Roller, MD 05/25/13 2119

## 2013-05-30 ENCOUNTER — Ambulatory Visit (INDEPENDENT_AMBULATORY_CARE_PROVIDER_SITE_OTHER): Payer: Medicaid Other | Admitting: Physician Assistant

## 2013-05-30 ENCOUNTER — Encounter: Payer: Self-pay | Admitting: Physician Assistant

## 2013-05-30 VITALS — BP 128/80 | HR 68 | Temp 98.2°F | Resp 18 | Wt 167.0 lb

## 2013-05-30 DIAGNOSIS — G40909 Epilepsy, unspecified, not intractable, without status epilepticus: Secondary | ICD-10-CM

## 2013-05-30 NOTE — Progress Notes (Signed)
Patient ID: LISSIE HINESLEY MRN: [id number], DOB: 04/01/1961, 52 y.o. Date of Encounter: 05/30/2013, 3:53 PM    Chief Complaint:  Chief Complaint  Patient presents with  . hospital follow up    headache/syncope     HPI: 52 y.o. year old white female is here to f/u recent ER visit. She went to ER 05/25/13 secondary to recurrent episodes of "falling out." she was sitting on couch and suddenly 'falling over/loss of consciousness." At ER neuro exam was normal. Head CT showed no new abnormality. CBC,BMET were nml. Pt here for f/u.      Home Meds: See attached medication section for any medications that were entered at today's visit. The computer does not put those onto this list.The following list is a list of meds entered prior to today's visit.   Current Outpatient Prescriptions on File Prior to Visit  Medication Sig Dispense Refill  . albuterol (PROVENTIL HFA;VENTOLIN HFA) 108 (90 BASE) MCG/ACT inhaler Inhale 2 puffs into the lungs every 6 (six) hours as needed. For shortness of breath       . aspirin 81 MG tablet Take 81 mg by mouth daily.      . budesonide-formoterol (SYMBICORT) 160-4.5 MCG/ACT inhaler Inhale 2 puffs into the lungs 2 (two) times daily.      . clonazePAM (KLONOPIN) 1 MG tablet Take 1 mg by mouth 2 (two) times daily.       Marland Kitchen dexlansoprazole (DEXILANT) 60 MG capsule Take 60 mg by mouth every evening.       . folic acid (FOLVITE) 400 MCG tablet Take 400 mcg by mouth daily.      Marland Kitchen lamoTRIgine (LAMICTAL) 100 MG tablet Take 200 mg by mouth daily.       Marland Kitchen levETIRAcetam (KEPPRA) 250 MG tablet Take 1 tablet (250 mg total) by mouth every 12 (twelve) hours.  60 tablet  3  . levothyroxine (SYNTHROID, LEVOTHROID) 50 MCG tablet Take 50 mcg by mouth every evening.       . Lurasidone HCl (LATUDA) 20 MG TABS Take 20 mg by mouth daily.      . naproxen (NAPROSYN) 500 MG tablet Take 1 tablet (500 mg total) by mouth 2 (two) times daily with a meal.  30 tablet  0  . Probiotic Product  (PROBIOTIC PO) Take 1 tablet by mouth daily.       . traZODone (DESYREL) 100 MG tablet Take 100 mg by mouth at bedtime.         No current facility-administered medications on file prior to visit.    Allergies: No Known Allergies    Review of Systems: See HPI for pertinent ROS. All other ROS negative.    Physical Exam: Blood pressure 128/80, pulse 68, temperature 98.2 F (36.8 C), temperature source Oral, resp. rate 18, weight 167 lb (75.751 kg)., Body mass index is 27.16 kg/(m^2). General: WNWD WF. Appears in no acute distress. Neck: Supple. No thyromegaly. No lymphadenopathy.No carotid bruits.  Lungs: Clear bilaterally to auscultation without wheezes, rales, or rhonchi. Breathing is unlabored. Heart: Regular rhythm. No murmurs, rubs, or gallops. Msk:  Strength and tone normal for age. Extremities/Skin: Warm and dry. No clubbing or cyanosis. No edema. No rashes or suspicious lesions. Neuro: Alert and oriented X 3. Moves all extremities spontaneously. Gait is normal. CNII-XII grossly in tact. 5/5 strength of all 4 extremities.  Psych:  Responds to questions appropriately with a normal affect.     ASSESSMENT AND PLAN:  52 y.o. year old female  with  1. Seizure disorder I discussed with patient that Neuro had thought these episodes were secondary to seizures. I reviewed LOV by Neuro, dated 03/24/2013. Dr. Anne Hahn added addendum that EEG study was performed that day. Suggested lowered seizure threshold and he started her on low dose Keppra, 250mg  BID.  She does not have OV scheduled for f/u there until October. I will call [facility] to schedule her f/u there sooner in near future.  She IS taking Keppra as directed. Cont this. F/u with neuro.   [address] [place], [place], [facility] 05/30/2013 3:53 PM

## 2013-06-01 ENCOUNTER — Telehealth: Payer: Self-pay | Admitting: Neurology

## 2013-06-06 ENCOUNTER — Encounter: Payer: Self-pay | Admitting: Physician Assistant

## 2013-06-06 ENCOUNTER — Ambulatory Visit (INDEPENDENT_AMBULATORY_CARE_PROVIDER_SITE_OTHER): Payer: Medicaid Other | Admitting: Physician Assistant

## 2013-06-06 VITALS — BP 110/74 | HR 64 | Temp 97.6°F | Resp 18 | Wt 164.0 lb

## 2013-06-06 DIAGNOSIS — L708 Other acne: Secondary | ICD-10-CM

## 2013-06-06 DIAGNOSIS — H7290 Unspecified perforation of tympanic membrane, unspecified ear: Secondary | ICD-10-CM

## 2013-06-06 DIAGNOSIS — H6122 Impacted cerumen, left ear: Secondary | ICD-10-CM

## 2013-06-06 DIAGNOSIS — H7292 Unspecified perforation of tympanic membrane, left ear: Secondary | ICD-10-CM

## 2013-06-06 DIAGNOSIS — L709 Acne, unspecified: Secondary | ICD-10-CM

## 2013-06-06 DIAGNOSIS — H6120 Impacted cerumen, unspecified ear: Secondary | ICD-10-CM

## 2013-06-06 MED ORDER — AMOXICILLIN-POT CLAVULANATE 875-125 MG PO TABS: 1.0000 | ORAL_TABLET | Freq: Two times a day (BID) | ORAL | Status: DC

## 2013-06-06 MED ORDER — OFLOXACIN 0.3 % OT SOLN: 10.0000 [drp] | Freq: Every day | OTIC | Status: DC

## 2013-06-06 MED ORDER — ADAPALENE 0.1 % EX GEL: Freq: Every day | CUTANEOUS | Status: DC

## 2013-06-06 NOTE — Addendum Note (Signed)
Addended by: Allayne Butcher on: 06/06/2013 02:51 PM   Modules accepted: Orders

## 2013-06-06 NOTE — Progress Notes (Addendum)
Patient ID: Sheryl Hall MRN: [id number], DOB: [birth year], 52 y.o. Date of Encounter: 06/06/2013, 2:22 PM    Chief Complaint:  Chief Complaint  Patient presents with  . needs ears cleaned out    history of ear surgery  . c/o lesions on corner of mouth     HPI: 52 y.o. year old female is here with two complaints:  1- left ear feels like it has wax in it. Has had surgery on that ear in past sot she "cannot clean it out." 2- bumps on her chin, under sides of lips. In past used her daughters Diffferin and it worked well. In past she was treated with Nystatin but she says that did not help at all.      Home Meds: See attached medication section for any medications that were entered at today's visit. The computer does not put those onto this list.The following list is a list of meds entered prior to today's visit.   Current Outpatient Prescriptions on File Prior to Visit  Medication Sig Dispense Refill  . albuterol (PROVENTIL HFA;VENTOLIN HFA) 108 (90 BASE) MCG/ACT inhaler Inhale 2 puffs into the lungs every 6 (six) hours as needed. For shortness of breath       . aspirin 81 MG tablet Take 81 mg by mouth daily.      . budesonide-formoterol (SYMBICORT) 160-4.5 MCG/ACT inhaler Inhale 2 puffs into the lungs 2 (two) times daily.      . clonazePAM (KLONOPIN) 1 MG tablet Take 1 mg by mouth 2 (two) times daily.       Marland Kitchen dexlansoprazole (DEXILANT) 60 MG capsule Take 60 mg by mouth every evening.       . folic acid (FOLVITE) 400 MCG tablet Take 400 mcg by mouth daily.      Marland Kitchen lamoTRIgine (LAMICTAL) 100 MG tablet Take 200 mg by mouth daily.       Marland Kitchen levETIRAcetam (KEPPRA) 250 MG tablet Take 1 tablet (250 mg total) by mouth every 12 (twelve) hours.  60 tablet  3  . levothyroxine (SYNTHROID, LEVOTHROID) 50 MCG tablet Take 50 mcg by mouth every evening.       . Lurasidone HCl (LATUDA) 20 MG TABS Take 20 mg by mouth daily.      . naproxen (NAPROSYN) 500 MG tablet Take 1 tablet (500 mg total) by mouth  2 (two) times daily with a meal.  30 tablet  0  . Probiotic Product (PROBIOTIC PO) Take 1 tablet by mouth daily.       . traZODone (DESYREL) 100 MG tablet Take 100 mg by mouth at bedtime.         No current facility-administered medications on file prior to visit.    Allergies: No Known Allergies    Review of Systems: See HPI for pertinent ROS. All other ROS negative.    Physical Exam: Blood pressure 110/74, pulse 64, temperature 97.6 F (36.4 C), temperature source Oral, resp. rate 18, weight 164 lb (74.39 kg)., Body mass index is 26.67 kg/(m^2). General: WNWD WF.  Appears in no acute distress. HEENT: Right Ear canal patent and ear normal. Left ear canal completely obstructed with cerumen. Neck: Supple. No thyromegaly. No lymphadenopathy. Lungs: Clear bilaterally to auscultation without wheezes, rales, or rhonchi. Breathing is unlabored. Heart: Regular rhythm. No murmurs, rubs, or gallops. Msk:  Strength and tone normal for age. Skin: Small papules just below her lips - on her chin.  Neuro: Alert and oriented X 3. Moves all extremities spontaneously.  CNII-XII grossly in tact. Psych:  Responds to questions appropriately with a normal affect.     ASSESSMENT AND PLAN:  52 y.o. year old female with  1. Cerumen impaction, left Will irrigate now.   2. Acne - adapalene (DIFFERIN) 0.1 % gel; Apply topically at bedtime.  Dispense: 45 g; Refill: 0  3. Addendum; Nurse informed me that she had irrigated ear and removed large piece of cerumen. However, pt was now having bleeding from left ear. I re-examined ear post irrigation. Canal is now patent. Very large piece of cerumen was in basin, which had been removed from the ear. The tympanic membrane is perforated-large perforation. There is red blood present. There is also clear drainage from ear. I can see no TM present at all. She says she has had surgery to this ear remotely.  I will treat with antibiotic drops and oral antibiotics. I will  schedule referral to f/u with ENT.  Signed, [address] [place], [place], [facility] 06/06/2013 2:22 PM

## 2013-06-20 NOTE — Telephone Encounter (Signed)
Done

## 2013-07-19 ENCOUNTER — Emergency Department (HOSPITAL_COMMUNITY): Payer: Medicaid Other

## 2013-07-19 ENCOUNTER — Emergency Department (HOSPITAL_COMMUNITY)
Admission: EM | Admit: 2013-07-19 | Discharge: 2013-07-19 | Disposition: A | Discharge status: Home / Self Care | Payer: Medicaid Other | Attending: Emergency Medicine | Admitting: Emergency Medicine

## 2013-07-19 ENCOUNTER — Encounter (HOSPITAL_COMMUNITY): Payer: Self-pay | Admitting: Emergency Medicine

## 2013-07-19 DIAGNOSIS — F4310 Post-traumatic stress disorder, unspecified: Secondary | ICD-10-CM | POA: Insufficient documentation

## 2013-07-19 DIAGNOSIS — Z8639 Personal history of other endocrine, nutritional and metabolic disease: Secondary | ICD-10-CM | POA: Insufficient documentation

## 2013-07-19 DIAGNOSIS — Z79899 Other long term (current) drug therapy: Secondary | ICD-10-CM | POA: Insufficient documentation

## 2013-07-19 DIAGNOSIS — F319 Bipolar disorder, unspecified: Secondary | ICD-10-CM | POA: Insufficient documentation

## 2013-07-19 DIAGNOSIS — F17200 Nicotine dependence, unspecified, uncomplicated: Secondary | ICD-10-CM | POA: Insufficient documentation

## 2013-07-19 DIAGNOSIS — R011 Cardiac murmur, unspecified: Secondary | ICD-10-CM | POA: Insufficient documentation

## 2013-07-19 DIAGNOSIS — Z9889 Other specified postprocedural states: Secondary | ICD-10-CM | POA: Insufficient documentation

## 2013-07-19 DIAGNOSIS — Z872 Personal history of diseases of the skin and subcutaneous tissue: Secondary | ICD-10-CM | POA: Insufficient documentation

## 2013-07-19 DIAGNOSIS — R51 Headache: Secondary | ICD-10-CM | POA: Insufficient documentation

## 2013-07-19 DIAGNOSIS — R569 Unspecified convulsions: Secondary | ICD-10-CM

## 2013-07-19 DIAGNOSIS — R404 Transient alteration of awareness: Secondary | ICD-10-CM | POA: Insufficient documentation

## 2013-07-19 DIAGNOSIS — R259 Unspecified abnormal involuntary movements: Secondary | ICD-10-CM | POA: Insufficient documentation

## 2013-07-19 DIAGNOSIS — Z7982 Long term (current) use of aspirin: Secondary | ICD-10-CM | POA: Insufficient documentation

## 2013-07-19 DIAGNOSIS — E538 Deficiency of other specified B group vitamins: Secondary | ICD-10-CM | POA: Insufficient documentation

## 2013-07-19 DIAGNOSIS — K219 Gastro-esophageal reflux disease without esophagitis: Secondary | ICD-10-CM | POA: Insufficient documentation

## 2013-07-19 DIAGNOSIS — R55 Syncope and collapse: Secondary | ICD-10-CM | POA: Insufficient documentation

## 2013-07-19 DIAGNOSIS — Z860100 Personal history of colon polyps, unspecified: Secondary | ICD-10-CM | POA: Insufficient documentation

## 2013-07-19 DIAGNOSIS — IMO0002 Reserved for concepts with insufficient information to code with codable children: Secondary | ICD-10-CM | POA: Insufficient documentation

## 2013-07-19 DIAGNOSIS — R079 Chest pain, unspecified: Secondary | ICD-10-CM

## 2013-07-19 DIAGNOSIS — E0590 Thyrotoxicosis, unspecified without thyrotoxic crisis or storm: Secondary | ICD-10-CM | POA: Insufficient documentation

## 2013-07-19 DIAGNOSIS — Z862 Personal history of diseases of the blood and blood-forming organs and certain disorders involving the immune mechanism: Secondary | ICD-10-CM | POA: Insufficient documentation

## 2013-07-19 DIAGNOSIS — Z8673 Personal history of transient ischemic attack (TIA), and cerebral infarction without residual deficits: Secondary | ICD-10-CM | POA: Insufficient documentation

## 2013-07-19 DIAGNOSIS — Z8719 Personal history of other diseases of the digestive system: Secondary | ICD-10-CM | POA: Insufficient documentation

## 2013-07-19 DIAGNOSIS — F3289 Other specified depressive episodes: Secondary | ICD-10-CM | POA: Insufficient documentation

## 2013-07-19 DIAGNOSIS — F329 Major depressive disorder, single episode, unspecified: Secondary | ICD-10-CM | POA: Insufficient documentation

## 2013-07-19 DIAGNOSIS — F411 Generalized anxiety disorder: Secondary | ICD-10-CM | POA: Insufficient documentation

## 2013-07-19 DIAGNOSIS — G40909 Epilepsy, unspecified, not intractable, without status epilepticus: Secondary | ICD-10-CM | POA: Insufficient documentation

## 2013-07-19 LAB — COMPREHENSIVE METABOLIC PANEL
Albumin: 3.1 g/dL — ABNORMAL LOW (ref 3.5–5.2)
BUN: 10 mg/dL (ref 6–23)
Chloride: 104 mEq/L (ref 96–112)
Creatinine, Ser: 0.92 mg/dL (ref 0.50–1.10)
Total Bilirubin: 0.3 mg/dL (ref 0.3–1.2)

## 2013-07-19 LAB — CBC WITH DIFFERENTIAL/PLATELET
Basophils Relative: 0 % (ref 0–1)
Eosinophils Absolute: 0 10*3/uL (ref 0.0–0.7)
HCT: 37.4 % (ref 36.0–46.0)
Hemoglobin: 13.1 g/dL (ref 12.0–15.0)
MCH: 31.1 pg (ref 26.0–34.0)
MCHC: 35 g/dL (ref 30.0–36.0)
MCV: 88.8 fL (ref 78.0–100.0)
Monocytes Absolute: 0.8 10*3/uL (ref 0.1–1.0)
Monocytes Relative: 9 % (ref 3–12)

## 2013-07-19 LAB — POCT I-STAT TROPONIN I: Troponin i, poc: 0 ng/mL (ref 0.00–0.08)

## 2013-07-19 MED ORDER — SODIUM CHLORIDE 0.9 % IV SOLN
INTRAVENOUS | Status: DC
  Administered 2013-07-19: 14:00:00 via INTRAVENOUS

## 2013-07-19 MED ORDER — HYDROMORPHONE HCL PF 1 MG/ML IJ SOLN
1.0000 mg | Freq: Once | INTRAMUSCULAR | Status: AC
  Administered 2013-07-19: 1 mg via INTRAVENOUS
  Filled 2013-07-19: qty 1

## 2013-07-19 MED ORDER — SODIUM CHLORIDE 0.9 % IV BOLUS (SEPSIS)
500.0000 mL | Freq: Once | INTRAVENOUS | Status: AC
  Administered 2013-07-19: 500 mL via INTRAVENOUS

## 2013-07-19 MED ORDER — ONDANSETRON HCL 4 MG/2ML IJ SOLN
4.0000 mg | Freq: Once | INTRAMUSCULAR | Status: AC
  Administered 2013-07-19: 4 mg via INTRAVENOUS
  Filled 2013-07-19: qty 2

## 2013-07-19 NOTE — ED Notes (Signed)
Per family pt was at court and went to the bathroom, began to c/o of some central cp and vomited. After pt vomited she collapsed on the floor. Family states pt was out for about a minute. EMS found her unresponsive on the floor of the bathroom, she became responsive when she got into the truck and c/o a headache. Pt was shaking and stuttering her words but did not appear post ictal. Vitals CBG 90, 168/95, 99% RA, 84 HR, 18 resp. Pt has hx of bipolar, GERD, Thyroid disease, Anxiety, and possible seizures.

## 2013-07-19 NOTE — ED Provider Notes (Signed)
CSN: [id number]     Arrival date & time 07/19/13  1119 History   First MD Initiated Contact with Patient 07/19/13 1235     Chief Complaint  Patient presents with  . Chest Pain  . Loss of Consciousness   (Consider location/radiation/quality/duration/timing/severity/associated sxs/prior Treatment) Patient is a 52 y.o. female presenting with chest pain and syncope. The history is provided by the patient, the EMS personnel and a relative.  Chest Pain Associated symptoms: headache and syncope   Associated symptoms: no abdominal pain, no back pain, no fever, no nausea, no shortness of breath and not vomiting   Loss of Consciousness Associated symptoms: headaches and seizures   Associated symptoms: no chest pain, no confusion, no fever, no nausea, no shortness of breath and no vomiting    [place] Patient was in court had a use the bathroom was found unresponsive on the floor the bathroom. She became responsive when EMS got her to the truck she was complaining of a frontal headache she states she had a headache before. Patient has a history of seizure disorder. EMS reported patient was shaking and staring or worse but not did not appear postictal. Her blood sugar at the scene was 90 blood pressure was 168/95 room air saturation was 99%. Patient also has a history of bipolar. Patient is on seizure medication Keppra patient is followed by [facility]. Patient states this is what her seizure activity is like.  Past Medical History  Diagnosis Date  . PTSD (post-traumatic stress disorder)   . Bipolar 1 disorder   . Heart murmur   . Diverticulosis of colon   . Keratosis, actinic   . Family hx of colon cancer     age 72-father  . GERD (gastroesophageal reflux disease)   . IBS (irritable bowel syndrome)   . Chronic nausea   . S/P endoscopy 05/30/2010    Dr Rourk-> non--critical Schatzki's ring, s/p 70F dilation  . S/P colonoscopy 05/30/2010    LAX sphincter  tone, anal papilla, left-sided diverticulosis, normal random biopsies,, 1 polyp-TA  . Polysubstance abuse   . Tubular adenoma of colon 05/30/2010    Next colonoscopy 05/2015  . Anxiety   . Depression   . Hyperthyroidism   . Hyperlipidemia   . Folate deficiency   . Hx of abnormal Pap smear   . Low back pain 03/01/2013    MRI with multiple levels of disc bulge  . Stroke     Right parietal   Past Surgical History  Procedure Laterality Date  . External ear surgery    . Neck surgery    . Hydrogen breath test  12/11/2011    Could not complete due to vomiting. Procedure: HYDROGEN BREATH TEST;  Surgeon: Corbin Ade, MD;  Location: [facility];  Service: Endoscopy;  Laterality: N/A;  for Bacterial Overgrowth/8:30   Family History  Problem Relation Age of Onset  . Diabetes Mother   . Hypertension Mother   . Colon cancer Father 49  . Hypertension Father   . Hypertension Sister   . Heart attack Brother    History  Substance Use Topics  . Smoking status: Current Every Day Smoker -- 0.50 packs/day    Types: Cigarettes  . Smokeless tobacco: Not on file  . Alcohol Use: No   OB History   Grav Para Term Preterm Abortions TAB SAB Ect Mult Living   2 1 1  1  1    1  Review of Systems  Constitutional: Negative for fever.  HENT: Negative for congestion.   Eyes: Negative for visual disturbance.  Respiratory: Negative for shortness of breath.   Cardiovascular: Positive for syncope. Negative for chest pain.  Gastrointestinal: Negative for nausea, vomiting, abdominal pain and diarrhea.  Genitourinary: Negative for dysuria.  Musculoskeletal: Negative for back pain.  Skin: Negative for rash.  Neurological: Positive for seizures, syncope and headaches.  Hematological: Does not bruise/bleed easily.  Psychiatric/Behavioral: Negative for confusion.    Allergies  Review of patient's allergies indicates no known allergies.  Home Medications   Current Outpatient Rx  Name  Route  Sig   Dispense  Refill  . aspirin 81 MG tablet   Oral   Take 81 mg by mouth daily.         . budesonide-formoterol (SYMBICORT) 160-4.5 MCG/ACT inhaler   Inhalation   Inhale 2 puffs into the lungs 2 (two) times daily as needed. Shortness of breath         . clonazePAM (KLONOPIN) 1 MG tablet   Oral   Take 0.5-1 mg by mouth 3 (three) times daily. Takes 1 mg in am , 1 mg at 3 pm and 0.5 mg in pm.         . dexlansoprazole (DEXILANT) 60 MG capsule   Oral   Take 60 mg by mouth every evening.          . folic acid (FOLVITE) 400 MCG tablet   Oral   Take 400 mcg by mouth daily.         Marland Kitchen lamoTRIgine (LAMICTAL) 100 MG tablet   Oral   Take 200 mg by mouth 2 (two) times daily.          Marland Kitchen levETIRAcetam (KEPPRA) 250 MG tablet   Oral   Take 1 tablet (250 mg total) by mouth every 12 (twelve) hours.   60 tablet   3   . levothyroxine (SYNTHROID, LEVOTHROID) 50 MCG tablet   Oral   Take 50 mcg by mouth every evening.          . Lurasidone HCl (LATUDA) 20 MG TABS   Oral   Take 20 mg by mouth 2 (two) times daily.          . Probiotic Product (PROBIOTIC PO)   Oral   Take 1 tablet by mouth daily.          . traZODone (DESYREL) 100 MG tablet   Oral   Take 100 mg by mouth at bedtime.            BP 145/66  Pulse 87  Temp(Src) 98.2 F (36.8 C)  Resp 15  SpO2 97% Physical Exam  Nursing note and vitals reviewed. Constitutional: She is oriented to person, place, and time. She appears well-developed and well-nourished. No distress.  HENT:  Head: Normocephalic and atraumatic.  Mouth/Throat: Oropharynx is clear and moist.  Eyes: Conjunctivae and EOM are normal. Pupils are equal, round, and reactive to light.  Neck: Normal range of motion.  Cardiovascular: Normal rate, regular rhythm, normal heart sounds and intact distal pulses.   No murmur heard. Pulmonary/Chest: Effort normal and breath sounds normal. No respiratory distress.  Abdominal: Soft. Bowel sounds are normal.  There is no tenderness.  Musculoskeletal: Normal range of motion. She exhibits no edema and no tenderness.  Neurological: She is alert and oriented to person, place, and time. No cranial nerve deficit. She exhibits normal muscle tone. Coordination normal.  Skin: Skin is warm.  No rash noted.    ED Course  Procedures (including critical care time) Labs Review Labs Reviewed  COMPREHENSIVE METABOLIC PANEL - Abnormal; Notable for the following:    Potassium 3.1 (*)    Glucose, Bld 104 (*)    Total Protein 5.3 (*)    Albumin 3.1 (*)    GFR calc non Af Amer 71 (*)    GFR calc Af Amer 82 (*)    All other components within normal limits  CBC WITH DIFFERENTIAL  POCT I-STAT TROPONIN I    Results for orders placed during the hospital encounter of 07/19/13  CBC WITH DIFFERENTIAL      Result Value Range   WBC 9.6  4.0 - 10.5 K/uL   RBC 4.21  3.87 - 5.11 MIL/uL   Hemoglobin 13.1  12.0 - 15.0 g/dL   HCT 78.2  95.6 - 21.3 %   MCV 88.8  78.0 - 100.0 fL   MCH 31.1  26.0 - 34.0 pg   MCHC 35.0  30.0 - 36.0 g/dL   RDW 08.6  57.8 - 46.9 %   Platelets 191  150 - 400 K/uL   Neutrophils Relative % 58  43 - 77 %   Neutro Abs 5.6  1.7 - 7.7 K/uL   Lymphocytes Relative 33  12 - 46 %   Lymphs Abs 3.2  0.7 - 4.0 K/uL   Monocytes Relative 9  3 - 12 %   Monocytes Absolute 0.8  0.1 - 1.0 K/uL   Eosinophils Relative 0  0 - 5 %   Eosinophils Absolute 0.0  0.0 - 0.7 K/uL   Basophils Relative 0  0 - 1 %   Basophils Absolute 0.0  0.0 - 0.1 K/uL  COMPREHENSIVE METABOLIC PANEL      Result Value Range   Sodium 141  135 - 145 mEq/L   Potassium 3.1 (*) 3.5 - 5.1 mEq/L   Chloride 104  96 - 112 mEq/L   CO2 29  19 - 32 mEq/L   Glucose, Bld 104 (*) 70 - 99 mg/dL   BUN 10  6 - 23 mg/dL   Creatinine, Ser 6.29  0.50 - 1.10 mg/dL   Calcium 8.5  8.4 - 52.8 mg/dL   Total Protein 5.3 (*) 6.0 - 8.3 g/dL   Albumin 3.1 (*) 3.5 - 5.2 g/dL   AST 10  0 - 37 U/L   ALT 6  0 - 35 U/L   Alkaline Phosphatase 61  39 - 117  U/L   Total Bilirubin 0.3  0.3 - 1.2 mg/dL   GFR calc non Af Amer 71 (*) >90 mL/min   GFR calc Af Amer 82 (*) >90 mL/min  POCT I-STAT TROPONIN I      Result Value Range   Troponin i, poc 0.00  0.00 - 0.08 ng/mL   Comment 3             Date: 07/19/2013  Rate: 55  Rhythm: normal sinus rhythm  QRS Axis: normal  Intervals: normal  ST/T Wave abnormalities: nonspecific T wave changes  Conduction Disutrbances:none  Narrative Interpretation:   Old EKG Reviewed: none available Actually sinus bradycardia. No old EKG for comparison.   Imaging Review Dg Chest 2 View  07/19/2013   CLINICAL DATA:  Mid chest pain. Heaviness. Vomiting. Seizure.  EXAM: CHEST  2 VIEW  COMPARISON:  12/14/2012  FINDINGS: Mild dextro convex thoracic scoliosis is likely positional. The lungs appear clear. The cardiac and mediastinal contours normal.  No pleural effusion identified.  IMPRESSION: No active cardiopulmonary disease.   Electronically Signed   By: Herbie Baltimore   On: 07/19/2013 13:58   Ct Head Wo Contrast  07/19/2013   *RADIOLOGY REPORT*  Clinical Data: Headache, seizure versus syncope  CT HEAD WITHOUT CONTRAST  Technique:  Contiguous axial images were obtained from the base of the skull through the vertex without contrast.  Comparison: 05/25/2013  Findings: No evidence of parenchymal hemorrhage or extra-axial fluid collection. No mass lesion, mass effect, or midline shift.  No CT evidence of acute infarction.  Encephalomalacic changes related to prior right parietal infarct.  Cerebral volume is age appropriate.  No ventriculomegaly.  The visualized paranasal sinuses are essentially clear. The mastoid air cells are unopacified.  No evidence of calvarial fracture.  IMPRESSION: Old right parietal infarct.  No evidence of acute intracranial abnormality.   Original Report Authenticated By: Charline Bills, M.D.    MDM   1. Seizure   2. Chest pain    Patient with history of seizures and treated today's episode  may have been seizure-like. Greater than a syncopal episode patient been monitored here cardiac-wise without any arrhythmias troponins x2 are negative head CT negative chest x-ray without evidence of any cardiopulmonary disease. EKG without any acute findings. Patient now feels fine. Most likely this was one of her seizure-like episodes. She is [facility] by [facility] and [facility]. Patient will return for any recurrent seizures or syncope.  Head CT was done for the frontal headachesacute findings air headache is now resolved.  Shelda Jakes, MD 07/19/13 1728

## 2013-07-19 NOTE — ED Notes (Signed)
Pt is slow to respond to questions when asked, stares at the ceiling. Pt attempted to get up and asked where she was and why she was brought here. Pt also c/o headache rates headache 10/10. Pt tearful.

## 2013-07-21 ENCOUNTER — Other Ambulatory Visit: Payer: Self-pay | Admitting: Neurology

## 2013-07-26 ENCOUNTER — Other Ambulatory Visit (HOSPITAL_COMMUNITY): Payer: Self-pay | Admitting: Family Medicine

## 2013-07-26 NOTE — Telephone Encounter (Signed)
Meds refilled.

## 2013-08-22 ENCOUNTER — Other Ambulatory Visit: Payer: Self-pay | Admitting: Physician Assistant

## 2013-08-22 NOTE — Telephone Encounter (Signed)
Medication refilled per protocol. 

## 2013-09-07 ENCOUNTER — Telehealth: Payer: Self-pay | Admitting: Neurology

## 2013-09-07 ENCOUNTER — Ambulatory Visit (INDEPENDENT_AMBULATORY_CARE_PROVIDER_SITE_OTHER): Payer: Self-pay | Admitting: Neurology

## 2013-09-07 DIAGNOSIS — F05 Delirium due to known physiological condition: Secondary | ICD-10-CM

## 2013-09-07 NOTE — Telephone Encounter (Signed)
The patient did not show up for the appointment.

## 2013-09-08 NOTE — Progress Notes (Signed)
The patient did not show for appointment

## 2013-10-12 ENCOUNTER — Other Ambulatory Visit: Payer: Self-pay | Admitting: Family Medicine

## 2013-10-12 DIAGNOSIS — Z139 Encounter for screening, unspecified: Secondary | ICD-10-CM

## 2013-10-13 ENCOUNTER — Ambulatory Visit (INDEPENDENT_AMBULATORY_CARE_PROVIDER_SITE_OTHER): Payer: Medicaid Other | Admitting: Physician Assistant

## 2013-10-13 ENCOUNTER — Encounter: Payer: Self-pay | Admitting: Physician Assistant

## 2013-10-13 VITALS — BP 100/60 | HR 78 | Temp 97.7°F | Resp 18 | Ht 64.0 in | Wt 158.0 lb

## 2013-10-13 DIAGNOSIS — N390 Urinary tract infection, site not specified: Secondary | ICD-10-CM

## 2013-10-13 LAB — URINALYSIS, MICROSCOPIC ONLY

## 2013-10-13 MED ORDER — NITROFURANTOIN MONOHYD MACRO 100 MG PO CAPS: 100.0000 mg | ORAL_CAPSULE | Freq: Two times a day (BID) | ORAL | Status: DC

## 2013-10-13 NOTE — Progress Notes (Signed)
Patient ID: NABA SNEED MRN: [id number], DOB: 04/24/1961, 52 y.o. Date of Encounter: 10/13/2013, 12:27 PM    Chief Complaint:  Chief Complaint  Patient presents with  . Urinary Tract Infection    X week and halp     HPI: 52 y.o. year old white female reports that she is having burning with urination. As well she only urinates very small amounts but then has to go right back to the bathroom began very soon. Also is having urgency. Says it is a very uncomfortable feeling. She has been using AZO. It seemed to be helping so she did not come in for evaluation. However now even with the AZO she is having symptoms so she is here for evaluation. She has had no fevers or chills. No back pain. No other complaints.  Home Meds: See attached medication section for any medications that were entered at today's visit. The computer does not put those onto this list.The following list is a list of meds entered prior to today's visit.   Current Outpatient Prescriptions on File Prior to Visit  Medication Sig Dispense Refill  . aspirin 81 MG tablet Take 81 mg by mouth daily.      . budesonide-formoterol (SYMBICORT) 160-4.5 MCG/ACT inhaler Inhale 2 puffs into the lungs 2 (two) times daily as needed. Shortness of breath      . clonazePAM (KLONOPIN) 1 MG tablet Take 0.5-1 mg by mouth 3 (three) times daily. Takes 1 mg in am , 1 mg at 3 pm and 0.5 mg in pm.      . DEXILANT 60 MG capsule TAKE ONE CAPSULE BY MOUTH EVERY DAY  30 capsule  6  . folic acid (FOLVITE) 400 MCG tablet Take 400 mcg by mouth daily.      Marland Kitchen lamoTRIgine (LAMICTAL) 100 MG tablet Take 200 mg by mouth 2 (two) times daily.       Marland Kitchen levETIRAcetam (KEPPRA) 250 MG tablet TAKE 1 TABLET (250 MG TOTAL)  BY MOUTH EVERY 12 (TWELVE) HOURS.  60 tablet  2  . levothyroxine (SYNTHROID, LEVOTHROID) 50 MCG tablet TAKE ONE TABLET BY MOUTH EVERY DAY  30 tablet  4  . Lurasidone HCl (LATUDA) 20 MG TABS Take 20 mg by mouth 2 (two) times daily.       . Probiotic  Product (PROBIOTIC PO) Take 1 tablet by mouth daily.       . traZODone (DESYREL) 100 MG tablet Take 100 mg by mouth at bedtime.         No current facility-administered medications on file prior to visit.    Allergies: No Known Allergies    Review of Systems: See HPI for pertinent ROS. All other ROS negative.    Physical Exam: Blood pressure 100/60, pulse 78, temperature 97.7 F (36.5 C), temperature source Oral, resp. rate 18, height 5\' 4"  (1.626 m), weight 158 lb (71.668 kg)., Body mass index is 27.11 kg/(m^2). General: WNWD WF.  Appears in no acute distress. Lungs: Clear bilaterally to auscultation without wheezes, rales, or rhonchi. Breathing is unlabored. Heart: Regular rhythm. No murmurs, rubs, or gallops. Abdomen: Soft, non-distended with normoactive bowel sounds. No hepatomegaly. No rebound/guarding. No obvious abdominal masses. Mild tenderness with palpation of the suprapubic area. No other areas of tenderness. Msk:  Strength and tone normal for age. No costophrenic angle tenderness with percussion bilaterally. Extremities/Skin: Warm and dry. No clubbing or cyanosis. No edema. No rashes or suspicious lesions. Neuro: Alert and oriented X 3. Moves all extremities  spontaneously. Gait is normal. CNII-XII grossly in tact. Psych:  Responds to questions appropriately with a normal affect.   Results for orders placed in visit on 10/13/13  URINALYSIS, MICROSCOPIC ONLY      Result Value Range   Squamous Epithelial / LPF MANY (*) RARE   Crystals Calcium Oxalate crystals noted  NONE SEEN   Casts Hyaline casts noted  NONE SEEN   WBC, UA 0-2  <3 WBC/hpf   RBC / HPF 0-2  <3 RBC/hpf   Bacteria, UA MANY (*) RARE   Urine-Other YEAST AND MUSUS PRESENT       ASSESSMENT AND PLAN:  52 y.o. year old female with  1. UTI (urinary tract infection) - nitrofurantoin, macrocrystal-monohydrate, (MACROBID) 100 MG capsule; Take 1 capsule (100 mg total) by mouth 2 (two) times daily.  Dispense: 6  capsule; Refill: 0  2. Urinary tract infection, site not specified - Urinalysis, microscopic only  Complete antibiotics. The symptoms do not resolve then followup.  [address] [place], [place], [facility] 10/13/2013 12:27 PM

## 2013-10-17 ENCOUNTER — Other Ambulatory Visit: Payer: Self-pay | Admitting: Physician Assistant

## 2013-10-17 ENCOUNTER — Ambulatory Visit: Payer: Medicaid Other | Admitting: Physician Assistant

## 2013-10-17 ENCOUNTER — Telehealth: Payer: Self-pay | Admitting: Family Medicine

## 2013-10-17 MED ORDER — CIPROFLOXACIN HCL 500 MG PO TABS: 500.0000 mg | ORAL_TABLET | Freq: Two times a day (BID) | ORAL | Status: DC

## 2013-10-17 MED ORDER — FLUCONAZOLE 150 MG PO TABS: ORAL_TABLET | ORAL | Status: DC

## 2013-10-17 NOTE — Telephone Encounter (Signed)
Pt is calling back because she seen Shon Hale on Thursday for a UTI and her antibiotic and she is not getting any better and she would like something stronger for it Pharmacy is [facility] she states number [phone] is Amandas Call back number

## 2013-10-17 NOTE — Telephone Encounter (Signed)
Pt also called leaving mess that still has UTI and wants Rx for different med??

## 2013-10-17 NOTE — Telephone Encounter (Signed)
Spoke to patient. Aware of new med orders.  Rx's to pharmacy

## 2013-10-17 NOTE — Telephone Encounter (Signed)
I reviewed her office visit note and the urinalysis results from that day. The urine did also show some yeast. I wonder if some of her symptoms are secondary to this yeast. Therefore I recommend to do the following: I will Prescribe 2 pills of Diflucan. She is to take one pill now and take another pill in one week at the completion of the new antibiotic. Send  prescription for: 1-  Diflucan 150 mg        1 by mouth daily today and then repeat one dose in 7 days.       Dispense #2 with 0 refills  2. Cipro 500 mg       1 by mouth twice a day x7 days        #14 with 0 refills  If symptoms do not resolve after the completion of the above, then she needs followup office visit.

## 2013-10-17 NOTE — Telephone Encounter (Signed)
Pt aware of new RX.  See phone mess.

## 2013-10-18 ENCOUNTER — Telehealth: Payer: Self-pay | Admitting: Family Medicine

## 2013-10-18 MED ORDER — BUDESONIDE-FORMOTEROL FUMARATE 160-4.5 MCG/ACT IN AERO: 2.0000 | INHALATION_SPRAY | Freq: Two times a day (BID) | RESPIRATORY_TRACT | Status: DC | PRN

## 2013-10-18 NOTE — Telephone Encounter (Signed)
Medication refilled per protocol. 

## 2013-10-21 ENCOUNTER — Other Ambulatory Visit: Payer: Self-pay | Admitting: Neurology

## 2013-10-26 ENCOUNTER — Other Ambulatory Visit: Payer: Self-pay | Admitting: Physician Assistant

## 2013-10-26 ENCOUNTER — Ambulatory Visit (INDEPENDENT_AMBULATORY_CARE_PROVIDER_SITE_OTHER): Payer: Medicaid Other | Admitting: Physician Assistant

## 2013-10-26 ENCOUNTER — Encounter: Payer: Self-pay | Admitting: Physician Assistant

## 2013-10-26 VITALS — BP 114/70 | HR 72 | Temp 97.4°F | Resp 18 | Wt 159.0 lb

## 2013-10-26 DIAGNOSIS — B9689 Other specified bacterial agents as the cause of diseases classified elsewhere: Secondary | ICD-10-CM

## 2013-10-26 DIAGNOSIS — N760 Acute vaginitis: Secondary | ICD-10-CM

## 2013-10-26 DIAGNOSIS — A499 Bacterial infection, unspecified: Secondary | ICD-10-CM

## 2013-10-26 DIAGNOSIS — R3915 Urgency of urination: Secondary | ICD-10-CM

## 2013-10-26 LAB — URINALYSIS, ROUTINE W REFLEX MICROSCOPIC
Bilirubin Urine: NEGATIVE
Glucose, UA: NEGATIVE mg/dL
Ketones, ur: NEGATIVE mg/dL
Leukocytes, UA: NEGATIVE
Nitrite: NEGATIVE
Protein, ur: NEGATIVE mg/dL
Specific Gravity, Urine: 1.025 (ref 1.005–1.030)
Urobilinogen, UA: 0.2 mg/dL (ref 0.0–1.0)
pH: 5.5 (ref 5.0–8.0)

## 2013-10-26 LAB — URINALYSIS, MICROSCOPIC ONLY
Casts: NONE SEEN
Crystals: NONE SEEN

## 2013-10-26 LAB — WET PREP FOR TRICH, YEAST, CLUE
Trich, Wet Prep: NONE SEEN
Yeast Wet Prep HPF POC: NONE SEEN

## 2013-10-26 MED ORDER — METRONIDAZOLE 500 MG PO TABS: 500.0000 mg | ORAL_TABLET | Freq: Two times a day (BID) | ORAL | Status: DC

## 2013-10-26 NOTE — Progress Notes (Signed)
Patient ID: Sheryl Hall MRN: [id number], DOB: Oct 07, 1961, 52 y.o. Date of Encounter: 10/26/2013, 3:54 PM    Chief Complaint:  Chief Complaint  Patient presents with  . unresolved UTI    still with urgency and frequency has finished two courses of abx     HPI: 52 y.o. year old white female seen here on 10/13/13. Diagnosed with UTI and treated with Macrobid.   She then called on 10/17/13 reporting continued symptoms despite completion of the antibiotic. At that time I reviewed the urinalysis done on 11/20. Yeast was present on the UA. So, at that time I prescribed Diflucan x2. Prescribed Cipro for 7 days. Told her to take one Diflucan then on November 24 and then the second Diflucan at the completion of the cipro. She states that she did take these medications as directed.  She reports she is having a burning sensation in her vulvar and vaginal area at all times. Even when she is not urinating she is experiencing this burning sensation. Also says that it feels itchy and irritated.     Home Meds: See attached medication section for any medications that were entered at today's visit. The computer does not put those onto this list.The following list is a list of meds entered prior to today's visit.   Current Outpatient Prescriptions on File Prior to Visit  Medication Sig Dispense Refill  . aspirin 81 MG tablet Take 81 mg by mouth daily.      . budesonide-formoterol (SYMBICORT) 160-4.5 MCG/ACT inhaler Inhale 2 puffs into the lungs 2 (two) times daily as needed. Shortness of breath  1 Inhaler  11  . clonazePAM (KLONOPIN) 1 MG tablet Take 0.5-1 mg by mouth 3 (three) times daily. Takes 1 mg in am , 1 mg at 3 pm and 0.5 mg in pm.      . DEXILANT 60 MG capsule TAKE ONE CAPSULE BY MOUTH EVERY DAY  30 capsule  6  . folic acid (FOLVITE) 400 MCG tablet Take 400 mcg by mouth daily.      Marland Kitchen lamoTRIgine (LAMICTAL) 100 MG tablet Take 200 mg by mouth 2 (two) times daily.       Marland Kitchen levETIRAcetam (KEPPRA)  250 MG tablet TAKE ONE TABLET BY MOUTH EVERY 12 HOURS.  60 tablet  5  . levothyroxine (SYNTHROID, LEVOTHROID) 50 MCG tablet TAKE ONE TABLET BY MOUTH EVERY DAY  30 tablet  4  . Probiotic Product (PROBIOTIC PO) Take 1 tablet by mouth daily.       . traZODone (DESYREL) 100 MG tablet Take 100 mg by mouth at bedtime.        . ciprofloxacin (CIPRO) 500 MG tablet Take 1 tablet (500 mg total) by mouth 2 (two) times daily.  14 tablet  0  . fluconazole (DIFLUCAN) 150 MG tablet Take one tablet today then repeat in 7 days  2 tablet  0  . Lurasidone HCl (LATUDA) 20 MG TABS Take 20 mg by mouth 2 (two) times daily.       . nitrofurantoin, macrocrystal-monohydrate, (MACROBID) 100 MG capsule Take 1 capsule (100 mg total) by mouth 2 (two) times daily.  6 capsule  0   No current facility-administered medications on file prior to visit.    Allergies: No Known Allergies    Review of Systems: See HPI for pertinent ROS. All other ROS negative.    Physical Exam: Blood pressure 114/70, pulse 72, temperature 97.4 F (36.3 C), temperature source Oral, resp. rate 18, weight  159 lb (72.122 kg)., Body mass index is 27.28 kg/(m^2). General: WNWD WF.  Appears in no acute distress. Lungs: Clear bilaterally to auscultation without wheezes, rales, or rhonchi. Breathing is unlabored. Heart: Regular rhythm. No murmurs, rubs, or gallops. Msk:  Strength and tone normal for age. Pelvic Exam: External genitalia normal. Vaginal mucosa normal. Cervix normal. Minimal vaginal discharge present. Bimanual exam is normal with no cervical motion tenderness and no mass. Neuro: Alert and oriented X 3. Moves all extremities spontaneously. Gait is normal. CNII-XII grossly in tact. Psych:  Responds to questions appropriately with a normal affect.     ASSESSMENT AND PLAN:  52 y.o. year old female with  1. Bacterial vaginosis - metroNIDAZOLE (FLAGYL) 500 MG tablet; Take 1 tablet (500 mg total) by mouth 2 (two) times daily.  Dispense: 14  tablet; Refill: 0 Follow up if symptoms do not resolve after completion of the medication.  2. Urgency of urination - Urinalysis, Routine w reflex microscopic  3. Vaginitis and vulvovaginitis - WET PREP FOR TRICH, YEAST, CLUE - GC/chlamydia probe amp, genital   Signed, [address] [place], [place], [facility] 10/26/2013 3:54 PM

## 2013-10-27 LAB — GC/CHLAMYDIA PROBE AMP
CT Probe RNA: NEGATIVE
GC Probe RNA: NEGATIVE

## 2013-10-31 ENCOUNTER — Ambulatory Visit (HOSPITAL_COMMUNITY)
Admission: RE | Admit: 2013-10-31 | Discharge: 2013-10-31 | Disposition: A | Discharge status: Home / Self Care | Payer: Medicaid Other | Source: Ambulatory Visit | Attending: Family Medicine | Admitting: Family Medicine

## 2013-10-31 ENCOUNTER — Encounter: Payer: Self-pay | Admitting: Family Medicine

## 2013-10-31 ENCOUNTER — Ambulatory Visit (INDEPENDENT_AMBULATORY_CARE_PROVIDER_SITE_OTHER): Payer: Medicaid Other | Admitting: Family Medicine

## 2013-10-31 VITALS — BP 106/68 | HR 98 | Temp 97.4°F | Resp 16 | Ht 65.0 in | Wt 159.0 lb

## 2013-10-31 DIAGNOSIS — N760 Acute vaginitis: Secondary | ICD-10-CM

## 2013-10-31 DIAGNOSIS — Z139 Encounter for screening, unspecified: Secondary | ICD-10-CM

## 2013-10-31 DIAGNOSIS — N390 Urinary tract infection, site not specified: Secondary | ICD-10-CM

## 2013-10-31 DIAGNOSIS — Z1231 Encounter for screening mammogram for malignant neoplasm of breast: Secondary | ICD-10-CM | POA: Insufficient documentation

## 2013-10-31 LAB — URINALYSIS, ROUTINE W REFLEX MICROSCOPIC
Protein, ur: 100 mg/dL — AB
Urobilinogen, UA: 4 mg/dL — ABNORMAL HIGH (ref 0.0–1.0)

## 2013-10-31 LAB — URINALYSIS, MICROSCOPIC ONLY

## 2013-10-31 MED ORDER — CEPHALEXIN 500 MG PO CAPS: 500.0000 mg | ORAL_CAPSULE | Freq: Three times a day (TID) | ORAL | Status: DC

## 2013-10-31 MED ORDER — FLUCONAZOLE 150 MG PO TABS: 150.0000 mg | ORAL_TABLET | Freq: Once | ORAL | Status: DC

## 2013-10-31 NOTE — Progress Notes (Signed)
Subjective:    Patient ID: Sheryl Hall, female    DOB: [birth year], 52 y.o.   MRN: [id number]  HPI This is the patient's third office visit in the last 3 weeks. She reports daily dysuria. She reports frequency and urgency and hesitancy. Previous urinalysis have been relatively benign. No urine culture has been performed. The patient has been treated with Macrobid as well as Cipro without any improvement in symptoms. She also received Diflucan for yeast infection as well as metronidazole for BV. Note his antibiotics helped her symptoms.  However she is taking a Z. over the counter with complete resolution of symptoms as long as she takes the medication. Gonorrhea and Chlamydia tests were negative.  Her primary concern today is persistent dysuria.  She denies any abdominal pain fevers chills or low back pain. Past Medical History  Diagnosis Date  . PTSD (post-traumatic stress disorder)   . Bipolar 1 disorder   . Heart murmur   . Diverticulosis of colon   . Keratosis, actinic   . Family hx of colon cancer     age 14-father  . GERD (gastroesophageal reflux disease)   . IBS (irritable bowel syndrome)   . Chronic nausea   . S/P endoscopy 05/30/2010    Dr Rourk-> non--critical Schatzki's ring, s/p 29F dilation  . S/P colonoscopy 05/30/2010    LAX sphincter tone, anal papilla, left-sided diverticulosis, normal random biopsies,, 1 polyp-TA  . Polysubstance abuse   . Tubular adenoma of colon 05/30/2010    Next colonoscopy 05/2015  . Anxiety   . Depression   . Hyperthyroidism   . Hyperlipidemia   . Folate deficiency   . Hx of abnormal Pap smear   . Low back pain 03/01/2013    MRI with multiple levels of disc bulge  . Stroke     Right parietal   Current Outpatient Prescriptions on File Prior to Visit  Medication Sig Dispense Refill  . aspirin 81 MG tablet Take 81 mg by mouth daily.      . budesonide-formoterol (SYMBICORT) 160-4.5 MCG/ACT inhaler Inhale 2 puffs into the lungs 2 (two) times daily  as needed. Shortness of breath  1 Inhaler  11  . clonazePAM (KLONOPIN) 1 MG tablet Take 0.5-1 mg by mouth 3 (three) times daily. Takes 1 mg in am , 1 mg at 3 pm and 0.5 mg in pm.      . DEXILANT 60 MG capsule TAKE ONE CAPSULE BY MOUTH EVERY DAY  30 capsule  6  . folic acid (FOLVITE) 400 MCG tablet Take 400 mcg by mouth daily.      Marland Kitchen lamoTRIgine (LAMICTAL) 100 MG tablet Take 200 mg by mouth 2 (two) times daily.       Marland Kitchen levETIRAcetam (KEPPRA) 250 MG tablet TAKE ONE TABLET BY MOUTH EVERY 12 HOURS.  60 tablet  5  . levothyroxine (SYNTHROID, LEVOTHROID) 50 MCG tablet TAKE ONE TABLET BY MOUTH EVERY DAY  30 tablet  4  . Lurasidone HCl (LATUDA) 20 MG TABS Take 20 mg by mouth 2 (two) times daily.       . metroNIDAZOLE (FLAGYL) 500 MG tablet Take 1 tablet (500 mg total) by mouth 2 (two) times daily.  14 tablet  0  . Probiotic Product (PROBIOTIC PO) Take 1 tablet by mouth daily.       . traZODone (DESYREL) 100 MG tablet Take 100 mg by mouth at bedtime.         No current facility-administered medications on file  prior to visit.   Past Surgical History  Procedure Laterality Date  . External ear surgery    . Neck surgery    . Hydrogen breath test  12/11/2011    Could not complete due to vomiting. Procedure: HYDROGEN BREATH TEST;  Surgeon: Corbin Ade, MD;  Location: [facility];  Service: Endoscopy;  Laterality: N/A;  for Bacterial Overgrowth/8:30   No Known Allergies History   Social History  . Marital Status: Divorced    Spouse Name: N/A    Number of Children: 1  . Years of Education: N/A   Occupational History  . disabled    Social History Main Topics  . Smoking status: Current Every Day Smoker -- 0.50 packs/day    Types: Cigarettes  . Smokeless tobacco: Not on file  . Alcohol Use: No  . Drug Use: Yes     Comment: hx cocaine abuse, last time last, still uses marijuana 4 times per week,   . Sexual Activity: No   Other Topics Concern  . Not on file   Social History Narrative    Lives w/ fiance            Review of Systems  All other systems reviewed and are negative.       Objective:   Physical Exam  Vitals reviewed. Cardiovascular: Normal rate, regular rhythm and normal heart sounds.   No murmur heard. Pulmonary/Chest: Effort normal and breath sounds normal. No respiratory distress. She has no wheezes. She has no rales.  Abdominal: Soft. Bowel sounds are normal. She exhibits no distension. There is no tenderness. There is no rebound.  Musculoskeletal: She exhibits no edema.   Office Visit on 10/31/2013  Component Date Value Range Status  . Color, Urine 10/31/2013 ORANGE* YELLOW Final   Biochemicals may be affected by the color of the urine.  Marland Kitchen APPearance 10/31/2013 TURBID* CLEAR Final  . Specific Gravity, Urine 10/31/2013 1.025  1.005 - 1.030 Final  . pH 10/31/2013 5.0  5.0 - 8.0 Final  . Glucose, UA 10/31/2013 250* NEG mg/dL Final  . Bilirubin Urine 10/31/2013 MOD* NEG Final  . Ketones, ur 10/31/2013 15* NEG mg/dL Final  . Hgb urine dipstick 10/31/2013 TRACE* NEG Final  . Protein, ur 10/31/2013 100* NEG mg/dL Final  . Urobilinogen, UA 10/31/2013 4* 0.0 - 1.0 mg/dL Final  . Nitrite 40/98/1191 POS* NEG Final  . Leukocytes, UA 10/31/2013 LARGE* NEG Final  . Squamous Epithelial / LPF 10/31/2013 MANY* RARE Final  . Crystals 10/31/2013 NONE SEEN  NONE SEEN Final  . Casts 10/31/2013 NONE SEEN  NONE SEEN Final  . WBC, UA 10/31/2013 7-10* <3 WBC/hpf Final  . RBC / HPF 10/31/2013 0-2  <3 RBC/hpf Final  . Bacteria, UA 10/31/2013 FEW* RARE Final   YEAST PRESENT          Assessment & Plan:  1. UTI (urinary tract infection) - cephALEXin (KEFLEX) 500 MG capsule; Take 1 capsule (500 mg total) by mouth 3 (three) times daily.  Dispense: 21 capsule; Refill: 0 - Urinalysis, Routine w reflex microscopic - Urine culture  Urinalysis today is significant for bacteriuria as well as pyuria.  Start Keflex 500 mg by mouth 3 times a day for 7 days. I will await  the results of the urine culture.  If urine culture is negative I would consult urology for a cystoscopy to evaluate for possible interstitial cystitis.  The patient's pelvic exam last time was essentially normal and her symptoms do not sound like  atrophic vaginitis. 2. Vaginitis and vulvovaginitis - fluconazole (DIFLUCAN) 150 MG tablet; Take 1 tablet (150 mg total) by mouth once.  Dispense: 1 tablet; Refill: 0 Patient also has yeast and her urinalysis also prescribed Diflucan 150 mg by mouth x1.

## 2013-11-01 LAB — URINE CULTURE: Colony Count: NO GROWTH

## 2013-11-07 ENCOUNTER — Encounter (HOSPITAL_COMMUNITY): Payer: Self-pay | Admitting: Emergency Medicine

## 2013-11-07 ENCOUNTER — Emergency Department (HOSPITAL_COMMUNITY): Payer: Medicaid Other

## 2013-11-07 ENCOUNTER — Telehealth: Payer: Self-pay | Admitting: Family Medicine

## 2013-11-07 ENCOUNTER — Emergency Department (HOSPITAL_COMMUNITY)
Admission: EM | Admit: 2013-11-07 | Discharge: 2013-11-07 | Disposition: A | Discharge status: Home / Self Care | Payer: Medicaid Other | Attending: Emergency Medicine | Admitting: Emergency Medicine

## 2013-11-07 DIAGNOSIS — Z792 Long term (current) use of antibiotics: Secondary | ICD-10-CM | POA: Insufficient documentation

## 2013-11-07 DIAGNOSIS — Z860100 Personal history of colon polyps, unspecified: Secondary | ICD-10-CM | POA: Insufficient documentation

## 2013-11-07 DIAGNOSIS — F411 Generalized anxiety disorder: Secondary | ICD-10-CM | POA: Insufficient documentation

## 2013-11-07 DIAGNOSIS — R011 Cardiac murmur, unspecified: Secondary | ICD-10-CM | POA: Insufficient documentation

## 2013-11-07 DIAGNOSIS — F4310 Post-traumatic stress disorder, unspecified: Secondary | ICD-10-CM | POA: Insufficient documentation

## 2013-11-07 DIAGNOSIS — E0590 Thyrotoxicosis, unspecified without thyrotoxic crisis or storm: Secondary | ICD-10-CM | POA: Insufficient documentation

## 2013-11-07 DIAGNOSIS — K5900 Constipation, unspecified: Secondary | ICD-10-CM

## 2013-11-07 DIAGNOSIS — Z79899 Other long term (current) drug therapy: Secondary | ICD-10-CM | POA: Insufficient documentation

## 2013-11-07 DIAGNOSIS — Z9889 Other specified postprocedural states: Secondary | ICD-10-CM | POA: Insufficient documentation

## 2013-11-07 DIAGNOSIS — F319 Bipolar disorder, unspecified: Secondary | ICD-10-CM | POA: Insufficient documentation

## 2013-11-07 DIAGNOSIS — Z8739 Personal history of other diseases of the musculoskeletal system and connective tissue: Secondary | ICD-10-CM | POA: Insufficient documentation

## 2013-11-07 DIAGNOSIS — Z7982 Long term (current) use of aspirin: Secondary | ICD-10-CM | POA: Insufficient documentation

## 2013-11-07 DIAGNOSIS — K219 Gastro-esophageal reflux disease without esophagitis: Secondary | ICD-10-CM | POA: Insufficient documentation

## 2013-11-07 DIAGNOSIS — F17200 Nicotine dependence, unspecified, uncomplicated: Secondary | ICD-10-CM | POA: Insufficient documentation

## 2013-11-07 DIAGNOSIS — Z872 Personal history of diseases of the skin and subcutaneous tissue: Secondary | ICD-10-CM | POA: Insufficient documentation

## 2013-11-07 DIAGNOSIS — R109 Unspecified abdominal pain: Secondary | ICD-10-CM | POA: Insufficient documentation

## 2013-11-07 DIAGNOSIS — Z8673 Personal history of transient ischemic attack (TIA), and cerebral infarction without residual deficits: Secondary | ICD-10-CM | POA: Insufficient documentation

## 2013-11-07 DIAGNOSIS — Z8742 Personal history of other diseases of the female genital tract: Secondary | ICD-10-CM | POA: Insufficient documentation

## 2013-11-07 LAB — HEPATIC FUNCTION PANEL
ALT: 15 U/L (ref 0–35)
Bilirubin, Direct: <0.1 mg/dL (ref 0.0–0.3)
Total Protein: 6.7 g/dL (ref 6.0–8.3)

## 2013-11-07 LAB — URINALYSIS, ROUTINE W REFLEX MICROSCOPIC
Ketones, ur: NEGATIVE mg/dL
Leukocytes, UA: NEGATIVE
Nitrite: POSITIVE — AB
Specific Gravity, Urine: 1.02 (ref 1.005–1.030)
Urobilinogen, UA: 0.2 mg/dL (ref 0.0–1.0)
pH: 6 (ref 5.0–8.0)

## 2013-11-07 LAB — URINE MICROSCOPIC-ADD ON

## 2013-11-07 MED ORDER — SODIUM CHLORIDE 0.9 % IJ SOLN
INTRAMUSCULAR | Status: AC
  Filled 2013-11-07: qty 500

## 2013-11-07 MED ORDER — IOHEXOL 300 MG/ML  SOLN
100.0000 mL | Freq: Once | INTRAMUSCULAR | Status: AC | PRN
  Administered 2013-11-07: 100 mL via INTRAVENOUS

## 2013-11-07 MED ORDER — HYDROCODONE-ACETAMINOPHEN 5-325 MG PO TABS: 1.0000 | ORAL_TABLET | Freq: Four times a day (QID) | ORAL | Status: DC | PRN

## 2013-11-07 MED ORDER — IOHEXOL 300 MG/ML  SOLN
50.0000 mL | Freq: Once | INTRAMUSCULAR | Status: AC | PRN
  Administered 2013-11-07: 50 mL via ORAL

## 2013-11-07 MED ORDER — HYDROCODONE-ACETAMINOPHEN 5-325 MG PO TABS
1.0000 | ORAL_TABLET | Freq: Once | ORAL | Status: AC
  Administered 2013-11-07: 1 via ORAL
  Filled 2013-11-07: qty 1

## 2013-11-07 NOTE — ED Notes (Signed)
Pt has finished an antibiotic for unknown-pt unable to state why, noted blood in urine last week and also with lower abd pain

## 2013-11-07 NOTE — Telephone Encounter (Signed)
LMTRC

## 2013-11-07 NOTE — Telephone Encounter (Signed)
Pt is calling because the medication is not working and she is wanting to know if she can get something that can help She believes it could be an Bladder problem not a UTI Call back number is [phone]

## 2013-11-07 NOTE — ED Provider Notes (Signed)
CSN: [id number]     Arrival date & time 11/07/13  1142 History   First MD Initiated Contact with Patient 11/07/13 1529     This chart was scribed for Benny Lennert, MD by Arlan Organ, ED Scribe. This patient was seen in room [facility]14[facility]14 and the patient's care was started 3:40 PM.   Chief Complaint  Patient presents with  . Hematuria   Patient is a 52 y.o. female presenting with hematuria. The history is provided by the patient. No language interpreter was used.  Hematuria This is a recurrent problem. The current episode started 3 to 5 hours ago. The problem occurs constantly. The problem has not changed since onset.Associated symptoms include abdominal pain. Pertinent negatives include no chest pain and no headaches. Nothing aggravates the symptoms. Nothing relieves the symptoms. She has tried nothing for the symptoms.    HPI Comments: Sheryl Hall is a 52 y.o. Female with h/o IBS who presents to the Emergency Department complaining of gradual onset, intermittent hematuria that first started 1 week ago. Pt states she was evaluated last week and was told "she had blood in her urine", and was advised to come to ED for further evaluation if symptoms persisted or did not improve. Pt now reports abdominal pain onset 3 weeks ago. She says urination and walking worsens her pain. Pt states she has been on several runs of antibiotics with no improvement. Pt reports having a pelvic exam done within the last few weeks, and was diagnosed with a bacterial infection. She denies any abdominal related surgeries.  Past Medical History  Diagnosis Date  . PTSD (post-traumatic stress disorder)   . Bipolar 1 disorder   . Heart murmur   . Diverticulosis of colon   . Keratosis, actinic   . Family hx of colon cancer     age 36-father  . GERD (gastroesophageal reflux disease)   . IBS (irritable bowel syndrome)   . Chronic nausea   . S/P endoscopy 05/30/2010    Dr Rourk-> non--critical Schatzki's ring, s/p  26F dilation  . S/P colonoscopy 05/30/2010    LAX sphincter tone, anal papilla, left-sided diverticulosis, normal random biopsies,, 1 polyp-TA  . Polysubstance abuse   . Tubular adenoma of colon 05/30/2010    Next colonoscopy 05/2015  . Anxiety   . Depression   . Hyperthyroidism   . Hyperlipidemia   . Folate deficiency   . Hx of abnormal Pap smear   . Low back pain 03/01/2013    MRI with multiple levels of disc bulge  . Stroke     Right parietal   Past Surgical History  Procedure Laterality Date  . External ear surgery    . Neck surgery    . Hydrogen breath test  12/11/2011    Could not complete due to vomiting. Procedure: HYDROGEN BREATH TEST;  Surgeon: Corbin Ade, MD;  Location: [facility];  Service: Endoscopy;  Laterality: N/A;  for Bacterial Overgrowth/8:30   Family History  Problem Relation Age of Onset  . Diabetes Mother   . Hypertension Mother   . Colon cancer Father 26  . Hypertension Father   . Hypertension Sister   . Heart attack Brother    History  Substance Use Topics  . Smoking status: Current Every Day Smoker -- 0.50 packs/day    Types: Cigarettes  . Smokeless tobacco: Not on file  . Alcohol Use: No   OB History   Grav Para Term Preterm Abortions TAB SAB Ect Mult  Living   2 1 1  1  1   1      Review of Systems  Constitutional: Negative for appetite change and fatigue.  HENT: Negative for congestion, ear discharge and sinus pressure.   Eyes: Negative for discharge.  Respiratory: Negative for cough.   Cardiovascular: Negative for chest pain.  Gastrointestinal: Positive for abdominal pain. Negative for diarrhea.  Genitourinary: Positive for hematuria. Negative for frequency.  Musculoskeletal: Negative for back pain.  Skin: Negative for rash.  Neurological: Negative for seizures and headaches.  Psychiatric/Behavioral: Negative for hallucinations.    Allergies  Review of patient's allergies indicates no known allergies.  Home Medications    Current Outpatient Rx  Name  Route  Sig  Dispense  Refill  . aspirin 81 MG tablet   Oral   Take 81 mg by mouth daily.         . budesonide-formoterol (SYMBICORT) 160-4.5 MCG/ACT inhaler   Inhalation   Inhale 2 puffs into the lungs 2 (two) times daily as needed. Shortness of breath   1 Inhaler   11   . cephALEXin (KEFLEX) 500 MG capsule   Oral   Take 1 capsule (500 mg total) by mouth 3 (three) times daily.   21 capsule   0   . clonazePAM (KLONOPIN) 1 MG tablet   Oral   Take 0.5-1 mg by mouth 3 (three) times daily. Takes 1 mg in am , 1 mg at 3 pm and 0.5 mg in pm.         . DEXILANT 60 MG capsule      TAKE ONE CAPSULE BY MOUTH EVERY DAY   30 capsule   6   . fluconazole (DIFLUCAN) 150 MG tablet   Oral   Take 1 tablet (150 mg total) by mouth once.   1 tablet   0   . folic acid (FOLVITE) 400 MCG tablet   Oral   Take 400 mcg by mouth daily.         Marland Kitchen lamoTRIgine (LAMICTAL) 100 MG tablet   Oral   Take 200 mg by mouth 2 (two) times daily.          Marland Kitchen levETIRAcetam (KEPPRA) 250 MG tablet      TAKE ONE TABLET BY MOUTH EVERY 12 HOURS.   60 tablet   5   . levothyroxine (SYNTHROID, LEVOTHROID) 50 MCG tablet      TAKE ONE TABLET BY MOUTH EVERY DAY   30 tablet   4   . Lurasidone HCl (LATUDA) 20 MG TABS   Oral   Take 20 mg by mouth 2 (two) times daily.          . metroNIDAZOLE (FLAGYL) 500 MG tablet   Oral   Take 1 tablet (500 mg total) by mouth 2 (two) times daily.   14 tablet   0   . Probiotic Product (PROBIOTIC PO)   Oral   Take 1 tablet by mouth daily.          . traZODone (DESYREL) 100 MG tablet   Oral   Take 100 mg by mouth at bedtime.            Triage Vitals: BP 115/82  Pulse 78  Temp(Src) 98 F (36.7 C) (Oral)  Resp 20  Ht 5\' 6"  (1.676 m)  Wt 158 lb (71.668 kg)  BMI 25.51 kg/m2  SpO2 98%  Physical Exam  Constitutional: She is oriented to person, place, and time. She appears well-developed.  HENT:  Head: Normocephalic.   Eyes: Conjunctivae and EOM are normal. No scleral icterus.  Neck: Neck supple. No thyromegaly present.  Cardiovascular: Normal rate and regular rhythm.  Exam reveals no gallop and no friction rub.   No murmur heard. Pulmonary/Chest: No stridor. She has no wheezes. She has no rales. She exhibits no tenderness.  Abdominal: She exhibits no distension. There is tenderness. There is no rebound.  Tenderness to palpation over suprapubic region  Musculoskeletal: Normal range of motion. She exhibits no edema.  Lymphadenopathy:    She has no cervical adenopathy.  Neurological: She is oriented to person, place, and time. She exhibits normal muscle tone. Coordination normal.  Skin: No rash noted. No erythema.  Psychiatric: She has a normal mood and affect. Her behavior is normal.    ED Course  Procedures (including critical care time)  DIAGNOSTIC STUDIES: Oxygen Saturation is 98% on RA, Normal by my interpretation.    COORDINATION OF CARE: 3:46 PM- Will order urinalysis. Discussed treatment plan with pt at bedside and pt agreed to plan.     Labs Review Labs Reviewed  URINALYSIS, ROUTINE W REFLEX MICROSCOPIC - Abnormal; Notable for the following:    Hgb urine dipstick TRACE (*)    Nitrite POSITIVE (*)    All other components within normal limits  URINE MICROSCOPIC-ADD ON - Abnormal; Notable for the following:    Bacteria, UA FEW (*)    All other components within normal limits  URINE CULTURE   Imaging Review No results found.  EKG Interpretation   None       MDM  Abdominal pain with dysuria.  Follow up with urology.j   Benny Lennert, MD 11/07/13 [phone]

## 2013-11-07 NOTE — Telephone Encounter (Signed)
Try elmiron 100 mg potid before meals for interstitial cystitis.

## 2013-11-08 ENCOUNTER — Telehealth: Payer: Self-pay | Admitting: Family Medicine

## 2013-11-08 DIAGNOSIS — N390 Urinary tract infection, site not specified: Secondary | ICD-10-CM

## 2013-11-08 DIAGNOSIS — R319 Hematuria, unspecified: Secondary | ICD-10-CM

## 2013-11-08 DIAGNOSIS — R300 Dysuria: Secondary | ICD-10-CM

## 2013-11-08 LAB — URINE CULTURE: Culture: NO GROWTH

## 2013-11-08 NOTE — Telephone Encounter (Signed)
Pt went to er yesterday and they wanted her to see urology. So she would like to wait until she sees them before taking any more medications.

## 2013-11-08 NOTE — Telephone Encounter (Signed)
Pt is calling today about the referral that was suppose to be put in for urologist She states that this place would be able to see her this week they just need the authorization to do so ---[phone] (On [address] in [place]) Dr Paulino Door Call back number is [phone]

## 2013-11-08 NOTE — Telephone Encounter (Signed)
Referral ordered

## 2013-12-16 ENCOUNTER — Telehealth: Payer: Self-pay | Admitting: Neurology

## 2013-12-16 NOTE — Telephone Encounter (Signed)
Spoke with patient and she is having a lot of left leg pain still,keppra is not helping

## 2013-12-16 NOTE — Telephone Encounter (Signed)
NEEDS APPT--HAVING PROBLEM WITH LT. LEG

## 2013-12-16 NOTE — Telephone Encounter (Signed)
I called patient. The patient indicates that she is having some problems with walking that has gradually gotten worse over the last 4 or 5 months. The patient did not show for her revisit appointment in October 2014. I'll try to get another appointment set up for her. The patient is on Keppra for presumed seizure-type events. The prior EEG study was abnormal.

## 2013-12-19 ENCOUNTER — Ambulatory Visit (INDEPENDENT_AMBULATORY_CARE_PROVIDER_SITE_OTHER): Payer: Medicaid Other | Admitting: Neurology

## 2013-12-19 ENCOUNTER — Encounter: Payer: Self-pay | Admitting: Neurology

## 2013-12-19 ENCOUNTER — Ambulatory Visit
Admission: RE | Admit: 2013-12-19 | Discharge: 2013-12-19 | Disposition: A | Discharge status: Home / Self Care | Payer: Medicaid Other | Source: Ambulatory Visit | Attending: Neurology | Admitting: Neurology

## 2013-12-19 ENCOUNTER — Telehealth: Payer: Self-pay | Admitting: Neurology

## 2013-12-19 VITALS — BP 133/71 | HR 78 | Wt 158.0 lb

## 2013-12-19 DIAGNOSIS — M87051 Idiopathic aseptic necrosis of right femur: Secondary | ICD-10-CM

## 2013-12-19 DIAGNOSIS — M545 Low back pain, unspecified: Secondary | ICD-10-CM

## 2013-12-19 DIAGNOSIS — Z8673 Personal history of transient ischemic attack (TIA), and cerebral infarction without residual deficits: Secondary | ICD-10-CM

## 2013-12-19 DIAGNOSIS — M87052 Idiopathic aseptic necrosis of left femur: Principal | ICD-10-CM

## 2013-12-19 DIAGNOSIS — M79609 Pain in unspecified limb: Secondary | ICD-10-CM

## 2013-12-19 NOTE — Telephone Encounter (Signed)
EMG/NCV cancelled

## 2013-12-19 NOTE — Progress Notes (Signed)
Reason for visit: Leg pain  Sheryl Hall is an 53 y.o. 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 The patient had an event of loss of consciousness and confusion around that time, and an EEG study was abnormal. The patient was placed on Keppra. The patient has not had any recurrence of these events. The patient is operating a motor vehicle at this time. Within the last 4 or 5 months, the patient has had some issues with numbness that spread up from the left foot into the left groin area associated with some discomfort from the groin to the knee with weightbearing. The patient does not have pain when she is sitting. The patient has difficulty with her left leg giving way when she bears weight, and she will fall on occasion. The patient denies any problems with her right leg. The patient does report some back pain. The patient also has a history of prior cervical spine surgery and chronic neck pain. The patient denies problems controlling the bowels or the bladder, but she recently has had some issues with hematuria and she has seen a urologist for this. Cystoscopy did not show the etiology of the hematuria. The patient comes to the office today for further evaluation.  Past Medical History  Diagnosis Date  . PTSD (post-traumatic stress disorder)   . Bipolar 1 disorder   . Heart murmur   . Diverticulosis of colon   . Keratosis, actinic   . Family hx of colon cancer     age 50-father  . GERD (gastroesophageal reflux disease)   . IBS (irritable bowel syndrome)   . Chronic nausea   . S/P endoscopy 05/30/2010    Dr Rourk-> non--critical Schatzki's ring, s/p 77F dilation  . S/P colonoscopy 05/30/2010    LAX sphincter tone, anal papilla, left-sided diverticulosis, normal random biopsies,, 1 polyp-TA  . Polysubstance abuse     . Tubular adenoma of colon 05/30/2010    Next colonoscopy 05/2015  . Anxiety   . Depression   . Hyperthyroidism   . Hyperlipidemia   . Folate deficiency   . Hx of abnormal Pap smear   . Low back pain 03/01/2013    MRI with multiple levels of disc bulge  . Stroke     Right parietal  . History of stroke 12/19/2013    Past Surgical History  Procedure Laterality Date  . External ear surgery    . Neck surgery    . Hydrogen breath test  12/11/2011    Could not complete due to vomiting. Procedure: HYDROGEN BREATH TEST;  Surgeon: Daneil Dolin, MD;  [facility]: [facility];  Service: Endoscopy;  Laterality: N/A;  for Bacterial Overgrowth/8:30    Family History  Problem Relation Age of Onset  . Diabetes Mother   . Hypertension Mother   . Colon cancer Father 85  . Hypertension Father   . Hypertension Sister   . Heart attack Brother     Social history:  reports that she has been smoking Cigarettes.  She has been smoking about 0.50 packs per day. She has never used smokeless tobacco. She reports that she does not drink alcohol or use illicit drugs.   No Known Allergies  Medications:  Current Outpatient Prescriptions on File Prior to Visit  Medication Sig Dispense Refill  . aspirin  81 MG tablet Take 81 mg by mouth daily.      . budesonide-formoterol (SYMBICORT) 160-4.5 MCG/ACT inhaler Inhale 2 puffs into the lungs 2 (two) times daily as needed. Shortness of breath  1 Inhaler  11  . clonazePAM (KLONOPIN) 1 MG tablet Take 0.5-2 mg by mouth 3 (three) times daily. Takes 2mg  in am ,  2mg  at 3 pm and 0.5 mg in pm.      . dexlansoprazole (DEXILANT) 60 MG capsule Take 60 mg by mouth at bedtime.      . folic acid (FOLVITE) 409 MCG tablet Take 400 mcg by mouth daily.      Marland Kitchen HYDROcodone-acetaminophen (NORCO/VICODIN) 5-325 MG per tablet Take 1 tablet by mouth every 6 (six) hours as needed for moderate pain.  20 tablet  0  . lamoTRIgine (LAMICTAL) 100 MG tablet Take 200 mg by mouth 2 (two) times  daily. Takes 200mg  with 25mg  for a total of 225mg  twice daily      . lamoTRIgine (LAMICTAL) 25 MG tablet Take 25 mg by mouth 2 (two) times daily. Takes 200mg  with 25mg  for a total of 225mg  twice daily      . levETIRAcetam (KEPPRA) 250 MG tablet Take 250 mg by mouth 2 (two) times daily.      Marland Kitchen levothyroxine (SYNTHROID, LEVOTHROID) 50 MCG tablet Take 50 mcg by mouth daily before breakfast.      . Probiotic Product (PROBIOTIC PO) Take 1 tablet by mouth daily.       . traZODone (DESYREL) 100 MG tablet Take 100 mg by mouth at bedtime.         No current facility-administered medications on file prior to visit.    ROS:  Out of a complete 14 system review of symptoms, the patient complains only of the following symptoms, and all other reviewed systems are negative.  Activity change, appetite change, chills, fatigue Neck stiffness, ear discharge, hearing loss, ear pain, ringing in the ears Chest pain, heart murmur Cold intolerance, excessive thirst Abdominal pain, constipation, nausea Frequent waking, daytime sleepiness Difficulty urinating, painful urination, frequency of urination, blood in the urine, urgency of the bladder Joint pain, joint swelling, back pain, achy muscles, muscle cramps, walking difficulties, coordination problems Bruising easily Dizziness, headache, numbness, blackout episodes, weakness Agitation, confusion, anxiety and depression, suicidal thoughts  Blood pressure 133/71, pulse 78, weight 158 lb (71.668 kg).  Physical Exam  General: The patient is alert and cooperative at the time of the examination.  Neuromuscular: The patient has good range of movement of the lumbosacral spine. No tenderness with palpation of the low back is noted. With internal and external rotation of the left hip, there is some pain.  Skin: No significant peripheral edema is noted.   Neurologic Exam  Mental status: The Mini-Mental status examination done today shows a total score  24/30.  Cranial nerves: Facial symmetry is present. Speech is normal, no aphasia or dysarthria is noted. Extraocular movements are full. Visual fields are full. The patient has symmetric pinprick sensation on the face, splits midline with vibration sensation, decreased on the right forehead.  Motor: The patient has good strength in all 4 extremities.  Sensory examination: Pinprick and soft touch sensation is decreased on the right arm and right leg as compared to the right. Vibration sensation is symmetric on the arms and legs.  Coordination: The patient has good finger-nose-finger and heel-to-shin bilaterally.  Gait and station: The patient has a limping type gait on the left leg Tandem gait  is normal. Romberg is negative. No drift is seen. The patient is able to walk on the toes and heels bilaterally.  Reflexes: Deep tendon reflexes are symmetric.   MRI brain 01/31/2013:  IMPRESSION:  No acute infarct.  Remote right periatrial infarct with encephalomalacia posterior  right temporal lobe and right occipital lobe.  No intracranial mass lesion detected on this unenhanced exam.    Assessment/Plan:  1. History of right parietal stroke, chronic  2. Abnormal EEG, possible seizures  3. Left leg and hip discomfort  4. Nonorganic neurologic examination   The patient will split the midline with vibration sensation on the forehead suggesting nonorganic features. The patient reports some pain in the left leg with weightbearing, and a mechanical source pain needs to be suspected. The patient also reports a change in sensation of the left leg. Nerve conduction studies will be performed on both legs, EMG of the left leg will be done. An x-ray of the left hip will be obtained. If the workup above is unremarkable, I will consider a referral to an orthopedic surgeon. The patient will followup for the EMG study.  Addendum: The left hip X-ray shows AVN. The radiologist reviewed the recent CT of the  pelvis done in December of 2014. This shows AVN bilaterally, but this was not reported. I will cancel the NCV and EMG study, and I will refer the patient to an orthopedic surgeon.  Jill Alexanders MD 12/19/2013 7:31 PM  [facility] [address] [place], [place] [address]  Phone [phone] Fax [phone]

## 2013-12-19 NOTE — Telephone Encounter (Signed)
I called the patient. The x-ray of the left hip shows avascular necrosis. In retrospect, when the radiologist went back and looked a CT of the pelvis done in December 2014, avascular process is apparent on that study bilaterally. At this point, I'll send patient to orthopedics surgeon, we will cancel the EMG and nerve conduction study ordered.

## 2013-12-22 ENCOUNTER — Emergency Department (HOSPITAL_COMMUNITY)
Admission: EM | Admit: 2013-12-22 | Discharge: 2013-12-22 | Disposition: A | Discharge status: Home / Self Care | Payer: Medicaid Other | Attending: Emergency Medicine | Admitting: Emergency Medicine

## 2013-12-22 ENCOUNTER — Telehealth: Payer: Self-pay | Admitting: Neurology

## 2013-12-22 ENCOUNTER — Encounter (HOSPITAL_COMMUNITY): Payer: Self-pay | Admitting: Emergency Medicine

## 2013-12-22 DIAGNOSIS — Z79899 Other long term (current) drug therapy: Secondary | ICD-10-CM | POA: Insufficient documentation

## 2013-12-22 DIAGNOSIS — Z860100 Personal history of colon polyps, unspecified: Secondary | ICD-10-CM | POA: Insufficient documentation

## 2013-12-22 DIAGNOSIS — F4310 Post-traumatic stress disorder, unspecified: Secondary | ICD-10-CM | POA: Insufficient documentation

## 2013-12-22 DIAGNOSIS — K219 Gastro-esophageal reflux disease without esophagitis: Secondary | ICD-10-CM | POA: Insufficient documentation

## 2013-12-22 DIAGNOSIS — F319 Bipolar disorder, unspecified: Secondary | ICD-10-CM | POA: Insufficient documentation

## 2013-12-22 DIAGNOSIS — Z862 Personal history of diseases of the blood and blood-forming organs and certain disorders involving the immune mechanism: Secondary | ICD-10-CM | POA: Insufficient documentation

## 2013-12-22 DIAGNOSIS — R011 Cardiac murmur, unspecified: Secondary | ICD-10-CM | POA: Insufficient documentation

## 2013-12-22 DIAGNOSIS — F411 Generalized anxiety disorder: Secondary | ICD-10-CM | POA: Insufficient documentation

## 2013-12-22 DIAGNOSIS — Z872 Personal history of diseases of the skin and subcutaneous tissue: Secondary | ICD-10-CM | POA: Insufficient documentation

## 2013-12-22 DIAGNOSIS — Z7982 Long term (current) use of aspirin: Secondary | ICD-10-CM | POA: Insufficient documentation

## 2013-12-22 DIAGNOSIS — Z9889 Other specified postprocedural states: Secondary | ICD-10-CM | POA: Insufficient documentation

## 2013-12-22 DIAGNOSIS — M25559 Pain in unspecified hip: Secondary | ICD-10-CM | POA: Insufficient documentation

## 2013-12-22 DIAGNOSIS — F17200 Nicotine dependence, unspecified, uncomplicated: Secondary | ICD-10-CM | POA: Insufficient documentation

## 2013-12-22 DIAGNOSIS — Z8639 Personal history of other endocrine, nutritional and metabolic disease: Secondary | ICD-10-CM | POA: Insufficient documentation

## 2013-12-22 DIAGNOSIS — M8708 Idiopathic aseptic necrosis of bone, other site: Secondary | ICD-10-CM | POA: Insufficient documentation

## 2013-12-22 DIAGNOSIS — Z8673 Personal history of transient ischemic attack (TIA), and cerebral infarction without residual deficits: Secondary | ICD-10-CM | POA: Insufficient documentation

## 2013-12-22 LAB — CBC WITH DIFFERENTIAL/PLATELET
BASOS ABS: 0 10*3/uL (ref 0.0–0.1)
Basophils Relative: 0 % (ref 0–1)
Eosinophils Absolute: 0.1 10*3/uL (ref 0.0–0.7)
Eosinophils Relative: 2 % (ref 0–5)
HEMATOCRIT: 43 % (ref 36.0–46.0)
Hemoglobin: 14.3 g/dL (ref 12.0–15.0)
LYMPHS PCT: 29 % (ref 12–46)
Lymphs Abs: 1.8 10*3/uL (ref 0.7–4.0)
MCH: 30.3 pg (ref 26.0–34.0)
MCHC: 33.3 g/dL (ref 30.0–36.0)
MCV: 91.1 fL (ref 78.0–100.0)
MONO ABS: 0.5 10*3/uL (ref 0.1–1.0)
Monocytes Relative: 9 % (ref 3–12)
NEUTROS ABS: 3.8 10*3/uL (ref 1.7–7.7)
NEUTROS PCT: 61 % (ref 43–77)
PLATELETS: 235 10*3/uL (ref 150–400)
RBC: 4.72 MIL/uL (ref 3.87–5.11)
RDW: 13 % (ref 11.5–15.5)
WBC: 6.2 10*3/uL (ref 4.0–10.5)

## 2013-12-22 LAB — URINALYSIS, ROUTINE W REFLEX MICROSCOPIC
Bilirubin Urine: NEGATIVE
GLUCOSE, UA: NEGATIVE mg/dL
Hgb urine dipstick: NEGATIVE
Ketones, ur: NEGATIVE mg/dL
Leukocytes, UA: NEGATIVE
NITRITE: NEGATIVE
PROTEIN: NEGATIVE mg/dL
Specific Gravity, Urine: 1.013 (ref 1.005–1.030)
Urobilinogen, UA: 0.2 mg/dL (ref 0.0–1.0)
pH: 6 (ref 5.0–8.0)

## 2013-12-22 LAB — BASIC METABOLIC PANEL
BUN: 7 mg/dL (ref 6–23)
CHLORIDE: 104 meq/L (ref 96–112)
CO2: 27 mEq/L (ref 19–32)
Calcium: 9.5 mg/dL (ref 8.4–10.5)
Creatinine, Ser: 1.05 mg/dL (ref 0.50–1.10)
GFR calc non Af Amer: 60 mL/min — ABNORMAL LOW (ref >90)
GFR, EST AFRICAN AMERICAN: 70 mL/min — AB (ref >90)
Glucose, Bld: 71 mg/dL (ref 70–99)
Potassium: 4.5 mEq/L (ref 3.7–5.3)
SODIUM: 143 meq/L (ref 137–147)

## 2013-12-22 MED ORDER — OXYCODONE-ACETAMINOPHEN 5-325 MG PO TABS: 2.0000 | ORAL_TABLET | Freq: Four times a day (QID) | ORAL | Status: DC | PRN

## 2013-12-22 MED ORDER — ONDANSETRON HCL 4 MG PO TABS: 4.0000 mg | ORAL_TABLET | Freq: Four times a day (QID) | ORAL | Status: DC

## 2013-12-22 MED ORDER — MORPHINE SULFATE 4 MG/ML IJ SOLN
4.0000 mg | Freq: Once | INTRAMUSCULAR | Status: AC
  Administered 2013-12-22: 4 mg via INTRAVENOUS
  Filled 2013-12-22: qty 1

## 2013-12-22 NOTE — ED Notes (Signed)
Per Marlon Pel, [place] to discontinue the ambulate order; Hassan Rowan, RN aware

## 2013-12-22 NOTE — ED Notes (Signed)
Pt undressed, in gown, on continuous pulse oximetry and blood pressure cuff; family at bedside 

## 2013-12-22 NOTE — ED Notes (Signed)
Pt dx with avascular necrosis on Monday in both hips, worse on the L.  The pain meds pt was dx with, pt has not been taking b/c they make her sick.

## 2013-12-22 NOTE — Telephone Encounter (Signed)
Friend of the Patient, Champ Mungo ([phone]), called in to stated that the patient was in such severe pain they have called 911 and Ms. Liz is going to the hospital.  Champ Mungo asked if we would call her back as she didn't know whether the ER doctor would need information from Dr. Jannifer Franklin.

## 2013-12-22 NOTE — ED Notes (Signed)
IV Removed  

## 2013-12-22 NOTE — Discharge Instructions (Signed)
Hip Pain  The hips join the upper legs to the lower pelvis. The bones, cartilage, tendons, and muscles of the hip joint perform a lot of work each day holding your body weight and allowing you to move around.  Hip pain is a common symptom. It can range from a minor ache to severe pain on 1 or both hips. Pain may be felt on the inside of the hip joint near the groin, or the outside near the buttocks and upper thigh. There may be swelling or stiffness as well. It occurs more often when a person walks or performs activity. There are many reasons hip pain can develop.  CAUSES   It is important to work with your caregiver to identify the cause since many conditions can impact the bones, cartilage, muscles, and tendons of the hips. Causes for hip pain include:   Broken (fractured) bones.   Separation of the thighbone from the hip socket (dislocation).   Torn cartilage of the hip joint.   Swelling (inflammation) of a tendon (tendonitis), the sac within the hip joint (bursitis), or a joint.   A weakening in the abdominal wall (hernia), affecting the nerves to the hip.   Arthritis in the hip joint or lining of the hip joint.   Pinched nerves in the back, hip, or upper thigh.   A bulging disc in the spine (herniated disc).   Rarely, bone infection or cancer.  DIAGNOSIS   The location of your hip pain will help your caregiver understand what may be causing the pain. A diagnosis is based on your medical history, your symptoms, results from your physical exam, and results from diagnostic tests. Diagnostic tests may include X-ray exams, a computerized magnetic scan (magnetic resonance imaging, MRI), or bone scan.  TREATMENT   Treatment will depend on the cause of your hip pain. Treatment may include:   Limiting activities and resting until symptoms improve.   Crutches or other walking supports (a cane or brace).   Ice, elevation, and compression.   Physical therapy or home exercises.   Shoe inserts or special  shoes.   Losing weight.   Medications to reduce pain.   Undergoing surgery.  HOME CARE INSTRUCTIONS    Only take over-the-counter or prescription medicines for pain, discomfort, or fever as directed by your caregiver.   Put ice on the injured area:   Put ice in a plastic bag.   Place a towel between your skin and the bag.   Leave the ice on for 15-20 minutes at a time, 03-04 times a day.   Keep your leg raised (elevated) when possible to lessen swelling.   Avoid activities that cause pain.   Follow specific exercises as directed by your caregiver.   Sleep with a pillow between your legs on your most comfortable side.   Record how often you have hip pain, the location of the pain, and what it feels like. This information may be helpful to you and your caregiver.   Ask your caregiver about returning to work or sports and whether you should drive.   Follow up with your caregiver for further exams, therapy, or testing as directed.  SEEK MEDICAL CARE IF:    Your pain or swelling continues or worsens after 1 week.   You are feeling unwell or have chills.   You have increasing difficulty with walking.   You have a loss of sensation or other new symptoms.   You have questions or concerns.  SEEK   IMMEDIATE MEDICAL CARE IF:    You cannot put weight on the affected hip.   You have fallen.   You have a sudden increase in pain and swelling in your hip.   You have a fever.  MAKE SURE YOU:    Understand these instructions.   Will watch your condition.   Will get help right away if you are not doing well or get worse.  Document Released: 04/30/2010 Document Revised: 02/02/2012 Document Reviewed: 04/30/2010  [employer] Patient Information 2014 [employer].

## 2013-12-22 NOTE — Telephone Encounter (Signed)
Spoke with Clarene Critchley [phone]) and she said that patient is in [facility],was given pain medicine, will be looking at xray from Dr Jannifer Franklin

## 2013-12-22 NOTE — Telephone Encounter (Signed)
Events noted. The patient has bilateral avascular necrosis of the hips. The patient was already referred to [place]. I did not call the patient.

## 2013-12-22 NOTE — ED Provider Notes (Signed)
CSN: [id number]     Arrival date & time 12/22/13  1226 History   First MD Initiated Contact with Patient 12/22/13 1227     Chief Complaint  Patient presents with  . Hip Pain   (Consider location/radiation/quality/duration/timing/severity/associated sxs/prior Treatment) HPI Comments: Patient presents emergency department with chief complaint of left hip pain. She states that she has avascular necrosis of both hips, and has been told that she will need to have bilateral hip replacement surgery. She states that she has been taking Vicodin and ibuprofen with no relief. She states that when she woke this morning, the pain was worsened. She denies any new falls. She denies any bowel or bladder incontinence. Denies any saddle anesthesia. She states that she has chronic pain in the hips. She was referred to an orthopedic doctor, but has not heard back from him yet.  The history is provided by the patient. No language interpreter was used.    Past Medical History  Diagnosis Date  . PTSD (post-traumatic stress disorder)   . Bipolar 1 disorder   . Heart murmur   . Diverticulosis of colon   . Keratosis, actinic   . Family hx of colon cancer     age 41-father  . GERD (gastroesophageal reflux disease)   . IBS (irritable bowel syndrome)   . Chronic nausea   . S/P endoscopy 05/30/2010    Dr Rourk-> non--critical Schatzki's ring, s/p 5F dilation  . S/P colonoscopy 05/30/2010    LAX sphincter tone, anal papilla, left-sided diverticulosis, normal random biopsies,, 1 polyp-TA  . Polysubstance abuse   . Tubular adenoma of colon 05/30/2010    Next colonoscopy 05/2015  . Anxiety   . Depression   . Hyperthyroidism   . Hyperlipidemia   . Folate deficiency   . Hx of abnormal Pap smear   . Low back pain 03/01/2013    MRI with multiple levels of disc bulge  . Stroke     Right parietal  . History of stroke 12/19/2013   Past Surgical History  Procedure Laterality Date  . External ear surgery    . Neck  surgery    . Hydrogen breath test  12/11/2011    Could not complete due to vomiting. Procedure: HYDROGEN BREATH TEST;  Surgeon: Daneil Dolin, MD;  Location: [facility];  Service: Endoscopy;  Laterality: N/A;  for Bacterial Overgrowth/8:30   Family History  Problem Relation Age of Onset  . Diabetes Mother   . Hypertension Mother   . Colon cancer Father 41  . Hypertension Father   . Hypertension Sister   . Heart attack Brother    History  Substance Use Topics  . Smoking status: Current Every Day Smoker -- 0.50 packs/day    Types: Cigarettes  . Smokeless tobacco: Never Used  . Alcohol Use: No   OB History   Grav Para Term Preterm Abortions TAB SAB Ect Mult Living   2 1 1  1  1   1      Review of Systems  All other systems reviewed and are negative.    Allergies  Review of patient's allergies indicates no known allergies.  Home Medications   Current Outpatient Rx  Name  Route  Sig  Dispense  Refill  . aspirin 81 MG tablet   Oral   Take 81 mg by mouth daily.         . budesonide-formoterol (SYMBICORT) 160-4.5 MCG/ACT inhaler   Inhalation   Inhale 2 puffs into the lungs  2 (two) times daily as needed. Shortness of breath   1 Inhaler   11   . clonazePAM (KLONOPIN) 1 MG tablet   Oral   Take 0.5-2 mg by mouth 3 (three) times daily. Takes 2mg  in am ,  2mg  at 3 pm and 0.5 mg in pm.         . dexlansoprazole (DEXILANT) 60 MG capsule   Oral   Take 60 mg by mouth at bedtime.         . folic acid (FOLVITE) 409 MCG tablet   Oral   Take 400 mcg by mouth daily.         Marland Kitchen HYDROcodone-acetaminophen (NORCO/VICODIN) 5-325 MG per tablet   Oral   Take 1 tablet by mouth every 6 (six) hours as needed for moderate pain.   20 tablet   0   . lamoTRIgine (LAMICTAL) 100 MG tablet   Oral   Take 200 mg by mouth 2 (two) times daily. Takes 200mg  with 25mg  for a total of 225mg  twice daily         . lamoTRIgine (LAMICTAL) 25 MG tablet   Oral   Take 25 mg by mouth 2 (two)  times daily. Takes 200mg  with 25mg  for a total of 225mg  twice daily         . levETIRAcetam (KEPPRA) 250 MG tablet   Oral   Take 250 mg by mouth 2 (two) times daily.         Marland Kitchen levothyroxine (SYNTHROID, LEVOTHROID) 50 MCG tablet   Oral   Take 50 mcg by mouth daily before breakfast.         . Probiotic Product (PROBIOTIC PO)   Oral   Take 1 tablet by mouth daily.          . traZODone (DESYREL) 100 MG tablet   Oral   Take 100 mg by mouth at bedtime.            BP 129/76  Pulse 71  Temp(Src) 98.2 F (36.8 C) (Oral)  Resp 20  SpO2 99% Physical Exam  Nursing note and vitals reviewed. Constitutional: She is oriented to person, place, and time. She appears well-developed and well-nourished. No distress.  HENT:  Head: Normocephalic and atraumatic.  Eyes: Conjunctivae and EOM are normal. Pupils are equal, round, and reactive to light. Right eye exhibits no discharge. Left eye exhibits no discharge. No scleral icterus.  Neck: Normal range of motion. Neck supple. No tracheal deviation present.  Cardiovascular: Normal rate, regular rhythm and normal heart sounds.  Exam reveals no gallop and no friction rub.   No murmur heard. Pulmonary/Chest: Effort normal and breath sounds normal. No respiratory distress. She has no wheezes. She has no rales. She exhibits no tenderness.  Abdominal: Soft. Bowel sounds are normal. She exhibits no distension and no mass. There is no tenderness. There is no rebound and no guarding.  Musculoskeletal: Normal range of motion. She exhibits no edema and no tenderness.  Lumbar paraspinal muscles tender to palpation, no bony tenderness, step-offs, or gross abnormality or deformity of spine, patient is able to ambulate, moves all extremities  Bilateral great toe extension intact Bilateral plantar/dorsiflexion intact  Neurological: She is alert and oriented to person, place, and time. She has normal reflexes.  Sensation and strength intact  bilaterally Symmetrical reflexes  Skin: Skin is warm and dry. She is not diaphoretic.  Psychiatric: She has a normal mood and affect. Her behavior is normal. Judgment and thought content normal.  ED Course  Procedures (including critical care time) Results for orders placed during the hospital encounter of 12/22/13  URINALYSIS, ROUTINE W REFLEX MICROSCOPIC      Result Value Range   Color, Urine YELLOW  YELLOW   APPearance CLEAR  CLEAR   Specific Gravity, Urine 1.013  1.005 - 1.030   pH 6.0  5.0 - 8.0   Glucose, UA NEGATIVE  NEGATIVE mg/dL   Hgb urine dipstick NEGATIVE  NEGATIVE   Bilirubin Urine NEGATIVE  NEGATIVE   Ketones, ur NEGATIVE  NEGATIVE mg/dL   Protein, ur NEGATIVE  NEGATIVE mg/dL   Urobilinogen, UA 0.2  0.0 - 1.0 mg/dL   Nitrite NEGATIVE  NEGATIVE   Leukocytes, UA NEGATIVE  NEGATIVE  CBC WITH DIFFERENTIAL      Result Value Range   WBC 6.2  4.0 - 10.5 K/uL   RBC 4.72  3.87 - 5.11 MIL/uL   Hemoglobin 14.3  12.0 - 15.0 g/dL   HCT 43.0  36.0 - 46.0 %   MCV 91.1  78.0 - 100.0 fL   MCH 30.3  26.0 - 34.0 pg   MCHC 33.3  30.0 - 36.0 g/dL   RDW 13.0  11.5 - 15.5 %   Platelets 235  150 - 400 K/uL   Neutrophils Relative % 61  43 - 77 %   Neutro Abs 3.8  1.7 - 7.7 K/uL   Lymphocytes Relative 29  12 - 46 %   Lymphs Abs 1.8  0.7 - 4.0 K/uL   Monocytes Relative 9  3 - 12 %   Monocytes Absolute 0.5  0.1 - 1.0 K/uL   Eosinophils Relative 2  0 - 5 %   Eosinophils Absolute 0.1  0.0 - 0.7 K/uL   Basophils Relative 0  0 - 1 %   Basophils Absolute 0.0  0.0 - 0.1 K/uL  BASIC METABOLIC PANEL      Result Value Range   Sodium 143  137 - 147 mEq/L   Potassium 4.5  3.7 - 5.3 mEq/L   Chloride 104  96 - 112 mEq/L   CO2 27  19 - 32 mEq/L   Glucose, Bld 71  70 - 99 mg/dL   BUN 7  6 - 23 mg/dL   Creatinine, Ser 1.05  0.50 - 1.10 mg/dL   Calcium 9.5  8.4 - 10.5 mg/dL   GFR calc non Af Amer 60 (*) >90 mL/min   GFR calc Af Amer 70 (*) >90 mL/min   Dg Hip Complete Left  12/19/2013    CLINICAL DATA:  Constant left hip pain.  EXAM: LEFT HIP - COMPLETE 2+ VIEW  COMPARISON:  CT ABD - PELV W/ CM dated 11/07/2013  FINDINGS: Joint space in the left hip is maintained. A curvilinear area of sclerosis in the left femoral head without subchondral collapse. No osteophytosis. No fracture or dislocation. Left obturator ring is intact.  IMPRESSION: 1. Curvilinear sclerosis in the left femoral head without associated subchondral collapse. When compared with 11/07/2013, finding is consistent with avascular necrosis. These results were called by telephone at the time of interpretation on 12/19/2013 at 3:31 PM to Dr. Margette Fast , who verbally acknowledged these results. 2. No acute fracture or dislocation.   Electronically Signed   By: Lorin Picket M.D.   On: 12/19/2013 15:46     EKG Interpretation   None       MDM   1. Hip pain    Patient with hip pain.  Was recently  diagnosed with avascular necrosis. She is awaiting orthopedic followup. Urine is clean. No evidence of UTI. Labs are reassuring. Patient is to followup with orthopedics. Will prescribe pain medicine, and Zofran. Patient understands and agrees to plan. She is stable and ready for discharge.  Patient discussed with Dr. Stark Jock.    Montine Circle, PA-C 12/22/13 1515

## 2013-12-22 NOTE — Telephone Encounter (Signed)
Patient called to state that she was told she would need to get a double hip replacement. Patient states she is in a lot of pain and is requesting that something be called in for her to help with the pain. Please call patient and advise.

## 2013-12-22 NOTE — ED Notes (Signed)
PA at bedside.

## 2013-12-23 ENCOUNTER — Other Ambulatory Visit: Payer: Self-pay | Admitting: Family Medicine

## 2013-12-23 NOTE — ED Provider Notes (Signed)
Medical screening examination/treatment/procedure(s) were performed by non-physician practitioner and as supervising physician I was immediately available for consultation/collaboration.     Veryl Speak, MD 12/23/13 [phone]

## 2013-12-26 ENCOUNTER — Encounter: Payer: Medicaid Other | Admitting: Neurology

## 2013-12-26 ENCOUNTER — Encounter: Payer: Medicaid Other | Admitting: Radiology

## 2014-01-23 ENCOUNTER — Telehealth: Payer: Self-pay | Admitting: Family Medicine

## 2014-01-23 MED ORDER — DEXLANSOPRAZOLE 60 MG PO CPDR: 60.0000 mg | DELAYED_RELEASE_CAPSULE | Freq: Every day | ORAL | Status: DC

## 2014-01-23 NOTE — Telephone Encounter (Signed)
Call back number is [phone] Pt is needing refill on dexlansoprazole (DEXILANT) 60 MG capsule [facility] [phone])

## 2014-01-23 NOTE — Telephone Encounter (Signed)
Meds refilled.

## 2014-01-24 ENCOUNTER — Telehealth: Payer: Self-pay | Admitting: *Deleted

## 2014-01-24 NOTE — Telephone Encounter (Signed)
Okay with omeprazole 40 mg poqday

## 2014-01-24 NOTE — Telephone Encounter (Signed)
Pt states that her insurance will not cover [place] anymore, wants to know if you can prescribe her prilosec again pharmacy [facility]

## 2014-01-25 MED ORDER — OMEPRAZOLE 40 MG PO CPDR: 40.0000 mg | DELAYED_RELEASE_CAPSULE | Freq: Every day | ORAL | Status: DC

## 2014-01-25 NOTE — Telephone Encounter (Signed)
Med change sent to pharm.

## 2014-01-26 ENCOUNTER — Telehealth: Payer: Self-pay | Admitting: *Deleted

## 2014-01-26 NOTE — Telephone Encounter (Signed)
Received PA request from pharmacy for [place]. PA submitted.

## 2014-02-25 ENCOUNTER — Encounter (HOSPITAL_COMMUNITY): Payer: Self-pay | Admitting: Emergency Medicine

## 2014-02-25 ENCOUNTER — Emergency Department (HOSPITAL_COMMUNITY): Payer: Medicaid Other

## 2014-02-25 ENCOUNTER — Emergency Department (HOSPITAL_COMMUNITY)
Admission: EM | Admit: 2014-02-25 | Discharge: 2014-02-25 | Disposition: A | Discharge status: Home / Self Care | Payer: Medicaid Other | Attending: Emergency Medicine | Admitting: Emergency Medicine

## 2014-02-25 DIAGNOSIS — F29 Unspecified psychosis not due to a substance or known physiological condition: Secondary | ICD-10-CM | POA: Insufficient documentation

## 2014-02-25 DIAGNOSIS — Z872 Personal history of diseases of the skin and subcutaneous tissue: Secondary | ICD-10-CM | POA: Insufficient documentation

## 2014-02-25 DIAGNOSIS — E0590 Thyrotoxicosis, unspecified without thyrotoxic crisis or storm: Secondary | ICD-10-CM | POA: Insufficient documentation

## 2014-02-25 DIAGNOSIS — G40909 Epilepsy, unspecified, not intractable, without status epilepticus: Secondary | ICD-10-CM | POA: Insufficient documentation

## 2014-02-25 DIAGNOSIS — F411 Generalized anxiety disorder: Secondary | ICD-10-CM | POA: Insufficient documentation

## 2014-02-25 DIAGNOSIS — Z7982 Long term (current) use of aspirin: Secondary | ICD-10-CM | POA: Insufficient documentation

## 2014-02-25 DIAGNOSIS — F17200 Nicotine dependence, unspecified, uncomplicated: Secondary | ICD-10-CM | POA: Insufficient documentation

## 2014-02-25 DIAGNOSIS — F319 Bipolar disorder, unspecified: Secondary | ICD-10-CM | POA: Insufficient documentation

## 2014-02-25 DIAGNOSIS — Z79899 Other long term (current) drug therapy: Secondary | ICD-10-CM | POA: Insufficient documentation

## 2014-02-25 DIAGNOSIS — K219 Gastro-esophageal reflux disease without esophagitis: Secondary | ICD-10-CM | POA: Insufficient documentation

## 2014-02-25 DIAGNOSIS — R410 Disorientation, unspecified: Secondary | ICD-10-CM

## 2014-02-25 DIAGNOSIS — I10 Essential (primary) hypertension: Secondary | ICD-10-CM | POA: Insufficient documentation

## 2014-02-25 DIAGNOSIS — G8929 Other chronic pain: Secondary | ICD-10-CM | POA: Insufficient documentation

## 2014-02-25 DIAGNOSIS — R011 Cardiac murmur, unspecified: Secondary | ICD-10-CM | POA: Insufficient documentation

## 2014-02-25 DIAGNOSIS — Z8673 Personal history of transient ischemic attack (TIA), and cerebral infarction without residual deficits: Secondary | ICD-10-CM | POA: Insufficient documentation

## 2014-02-25 HISTORY — DX: Other chronic pain: G89.29

## 2014-02-25 HISTORY — DX: Headache: R51

## 2014-02-25 HISTORY — DX: Pain in left hip: M25.552

## 2014-02-25 HISTORY — DX: Headache, unspecified: R51.9

## 2014-02-25 LAB — URINALYSIS, ROUTINE W REFLEX MICROSCOPIC
Bilirubin Urine: NEGATIVE
Glucose, UA: NEGATIVE mg/dL
Hgb urine dipstick: NEGATIVE
Ketones, ur: NEGATIVE mg/dL
Leukocytes, UA: NEGATIVE
Nitrite: NEGATIVE
PROTEIN: NEGATIVE mg/dL
SPECIFIC GRAVITY, URINE: 1.01 (ref 1.005–1.030)
UROBILINOGEN UA: 0.2 mg/dL (ref 0.0–1.0)
pH: 6 (ref 5.0–8.0)

## 2014-02-25 LAB — CBC WITH DIFFERENTIAL/PLATELET
BASOS PCT: 0 % (ref 0–1)
Basophils Absolute: 0 10*3/uL (ref 0.0–0.1)
Eosinophils Absolute: 0.1 10*3/uL (ref 0.0–0.7)
Eosinophils Relative: 2 % (ref 0–5)
HCT: 45 % (ref 36.0–46.0)
Hemoglobin: 14.8 g/dL (ref 12.0–15.0)
LYMPHS ABS: 1.6 10*3/uL (ref 0.7–4.0)
Lymphocytes Relative: 27 % (ref 12–46)
MCH: 30.1 pg (ref 26.0–34.0)
MCHC: 32.9 g/dL (ref 30.0–36.0)
MCV: 91.5 fL (ref 78.0–100.0)
Monocytes Absolute: 0.4 10*3/uL (ref 0.1–1.0)
Monocytes Relative: 7 % (ref 3–12)
NEUTROS PCT: 63 % (ref 43–77)
Neutro Abs: 3.6 10*3/uL (ref 1.7–7.7)
PLATELETS: 220 10*3/uL (ref 150–400)
RBC: 4.92 MIL/uL (ref 3.87–5.11)
RDW: 13.4 % (ref 11.5–15.5)
WBC: 5.7 10*3/uL (ref 4.0–10.5)

## 2014-02-25 LAB — COMPREHENSIVE METABOLIC PANEL
ALBUMIN: 4 g/dL (ref 3.5–5.2)
ALK PHOS: 119 U/L — AB (ref 39–117)
ALT: 10 U/L (ref 0–35)
AST: 18 U/L (ref 0–37)
BUN: 8 mg/dL (ref 6–23)
CO2: 27 mEq/L (ref 19–32)
Calcium: 9.7 mg/dL (ref 8.4–10.5)
Chloride: 104 mEq/L (ref 96–112)
Creatinine, Ser: 1.12 mg/dL — ABNORMAL HIGH (ref 0.50–1.10)
GFR calc Af Amer: 65 mL/min — ABNORMAL LOW (ref >90)
GFR calc non Af Amer: 56 mL/min — ABNORMAL LOW (ref >90)
GLUCOSE: 110 mg/dL — AB (ref 70–99)
POTASSIUM: 4 meq/L (ref 3.7–5.3)
Sodium: 142 mEq/L (ref 137–147)
TOTAL PROTEIN: 7.3 g/dL (ref 6.0–8.3)
Total Bilirubin: 0.3 mg/dL (ref 0.3–1.2)

## 2014-02-25 LAB — SALICYLATE LEVEL: Salicylate Lvl: <2 mg/dL — ABNORMAL LOW (ref 2.8–20.0)

## 2014-02-25 LAB — ETHANOL

## 2014-02-25 LAB — RAPID URINE DRUG SCREEN, HOSP PERFORMED
AMPHETAMINES: NOT DETECTED
BARBITURATES: NOT DETECTED
BENZODIAZEPINES: NOT DETECTED
Cocaine: NOT DETECTED
Opiates: NOT DETECTED
Tetrahydrocannabinol: NOT DETECTED

## 2014-02-25 LAB — PROTIME-INR
INR: 0.87 (ref 0.00–1.49)
PROTHROMBIN TIME: 11.7 s (ref 11.6–15.2)

## 2014-02-25 LAB — LIPASE, BLOOD: Lipase: 31 U/L (ref 11–59)

## 2014-02-25 LAB — LACTIC ACID, PLASMA: LACTIC ACID, VENOUS: 0.9 mmol/L (ref 0.5–2.2)

## 2014-02-25 LAB — TROPONIN I: Troponin I: <0.3 ng/mL (ref <0.30)

## 2014-02-25 LAB — CBG MONITORING, ED: GLUCOSE-CAPILLARY: 96 mg/dL (ref 70–99)

## 2014-02-25 LAB — ACETAMINOPHEN LEVEL: Acetaminophen (Tylenol), Serum: <15 ug/mL (ref 10–30)

## 2014-02-25 MED ORDER — LEVETIRACETAM 500 MG PO TABS: 500.0000 mg | ORAL_TABLET | Freq: Two times a day (BID) | ORAL | Status: DC

## 2014-02-25 MED ORDER — SODIUM CHLORIDE 0.9 % IV SOLN
INTRAVENOUS | Status: DC
Start: 2014-02-25 — End: 2014-02-25
  Administered 2014-02-25: 1000 mL via INTRAVENOUS

## 2014-02-25 MED ORDER — ONDANSETRON HCL 4 MG/2ML IJ SOLN
4.0000 mg | Freq: Once | INTRAMUSCULAR | Status: AC
  Administered 2014-02-25: 4 mg via INTRAVENOUS
  Filled 2014-02-25: qty 2

## 2014-02-25 MED ORDER — LEVETIRACETAM 250 MG PO TABS
250.0000 mg | ORAL_TABLET | Freq: Once | ORAL | Status: AC
  Administered 2014-02-25: 250 mg via ORAL
  Filled 2014-02-25: qty 1

## 2014-02-25 MED ORDER — ONDANSETRON HCL 4 MG/2ML IJ SOLN: 4.0000 mg | INTRAMUSCULAR | Status: DC | PRN

## 2014-02-25 NOTE — Discharge Instructions (Signed)
°Emergency Department Resource Guide °1) Find a Doctor and Pay Out of Pocket °Although you won't have to find out who is covered by your insurance plan, it is a good idea to ask around and get recommendations. You will then need to call the office and see if the doctor you have chosen will accept you as a new patient and what types of options they offer for patients who are self-pay. Some doctors offer discounts or will set up payment plans for their patients who do not have insurance, but you will need to ask so you aren't surprised when you get to your appointment. ° °2) Contact Your Local Health Department °Not all health departments have doctors that can see patients for sick visits, but many do, so it is worth a call to see if yours does. If you don't know where your local health department is, you can check in your phone book. The CDC also has a tool to help you locate your state's health department, and many state websites also have listings of all of their local health departments. ° °3) Find a Walk-in Clinic °If your illness is not likely to be very severe or complicated, you may want to try a walk in clinic. These are popping up all over the country in pharmacies, drugstores, and shopping centers. They're usually staffed by nurse practitioners or physician assistants that have been trained to treat common illnesses and complaints. They're usually fairly quick and inexpensive. However, if you have serious medical issues or chronic medical problems, these are probably not your best option. ° °No Primary Care Doctor: °- Call [facility] at  832-8000 - they can help you locate a primary care doctor that  accepts your insurance, provides certain services, etc. °- Physician Referral Service- 1-800-533-3463 ° °Chronic Pain Problems: °Organization         Address  Phone   Notes  °[facility]  (336) 297-2271 Patients need to be referred by their primary care doctor.  ° °Medication  Assistance: °Organization         Address  Phone   Notes  °[place] [facility] [address] °[place], [place] [address] (336) 641-8030 --Must be a resident of [place] °-- Must have NO insurance coverage whatsoever (no Medicaid/ Medicare, etc.) °-- The pt. MUST have a primary care doctor that directs their care regularly and follows them in the community °  °[facility]  (866) 331-1348   °[employer]  (888) 892-1162   ° °Agencies that provide inexpensive medical care: °Organization         Address  Phone   Notes  °[facility]  (336) 832-8035   °[facility]    (336) 832-7272   °[facility] [address] °[place], [place] [address] (336) 832-4777   °[facility] [address], °[place] (336) 271-4999   °[facility]    (336) 373-0678   °[facility]    (336) 272-1050   °[facility] ° [address], [place] Phone:  (336) 832-4444, Fax:  (336) 832-4440 Hours of Operation:  9 am - 6 pm, M-F.  Also accepts Medicaid/Medicare and self-pay.  °[facility] ° [address], [place] Phone: (336) 832-3150, Fax: (336) [phone]. Hours of Operation:  8:30 am - 5:30 pm, M-F.  Also accepts Medicaid and self-pay.  °[facility] [place] [address], [place] Phone: (336) 878-6027   °[facility] [address], [place], [place] (336)723-1848, Ext. 123 Mondays & Thursdays: 7-9 AM.  First 15 patients are seen on a first come, first serve basis. °  ° °Medicaid-accepting [place] Providers: ° °Organization         Address  Phone   Notes  °[facility] [address], [place] (336) 641-2100 Also accepts self-pay patients.  °[facility] [address], [place] ° (336) 856-9996   °[facility] [address], [place]  (336) 288-8857   °[facility] [address] [place] [address], [place] (336) 299-7000   °Veita Bland [address], [place]  ° (336) 373-1557 Only accepts [place] Access Medicaid patients after they have their name applied to their card.  ° °Self-Pay (no insurance) in [place]: ° °Organization         Address  Phone   Notes  °Sickle Cell Patients, [facility] [address], [place] (336) 832-1970   °[facility] [address], [place] (336) 832-4400   °[facility] [place] ° [address], [place] (336) 992-4800   °[facility]/Dr. Osei-Bonsu ° [address] [place] [address], [place] or [address], [place] (336) 841-8500 Phone number for both [place] and [place] locations is the same.  °[facility] [address], [place] (336) 299-0000   °[facility] [address] [place] [address], [place] or [address] (336) 852-7530 °(336) 878-2260   °[facility] [address], [place] (336) 350-1642, phone; (336) 294-5005, fax Sees patients 1st and 3rd Saturday of every month.  Must not qualify for public or private insurance (i.e. Medicaid, Medicare, [place] Health Choice, Veterans' Benefits) • Household income should be no more than 200% of the poverty level •The clinic cannot treat you if you are pregnant or think you are pregnant • Sexually transmitted diseases are not treated at the clinic.  ° ° °Dental Care: °Organization         Address  Phone  Notes  °[place] [facility] [address], [place] (336) 641-6152 Accepts children up to age 21 who are enrolled in Medicaid or [place] Health Choice; pregnant women with a Medicaid card; and children who have applied for Medicaid or [place] Health Choice, but were declined, whose parents can pay a reduced fee at time of service.  °[place]  [facility] [place]  [address], [place] (336) 641-7733 Accepts children up to age 21 who are enrolled in Medicaid or [place] Health Choice; pregnant women with a Medicaid card; and children who have applied for Medicaid or  Health Choice, but were declined, whose parents can pay a reduced fee at time of service.  °[facility] ° [address], [place] (336) 641-4533 Patients are seen by appointment only. Walk-ins are not accepted. [facility] will see patients 18 years of age and older. °Monday - Tuesday (8am-5pm) °Most Wednesdays (8:30-5pm) °$30 per visit, cash only  °[facility] ° [address], [place] (336) 641-4533 Patients are seen by appointment only. Walk-ins are not accepted. [facility] will see patients 18 years of age and older. °One   Wednesday Evening (Monthly: Volunteer Based).  $30 per visit, cash only  °[facility]  (919) 537-3737 for adults; Children under age 4, call Graduate Pediatric Dentistry at ([phone]) [phone]. Children aged 4-14, please call (919) [phone] to request a pediatric application. ° Dental services are provided in all areas of dental care including fillings, crowns and bridges, complete and partial dentures, implants, gum treatment, root canals, and extractions. Preventive care is also provided. Treatment is provided to both adults and children. °Patients are selected via a lottery and there is often a waiting list. °  °[facility] [address], °[place] ° (336) 763-8833 [email or web address] °  °[facility] [address], [place], [place] (336)723-1848, Ext. 123 Second and Fourth Thursday of each month, opens at 6:30 AM; Clinic ends at 9 AM.  Patients are seen on a first-come first-served basis, and a limited number are seen during each clinic.  ° °[facility] ° [address], [place], [place] (336) 723-7904    Eligibility Requirements °You must have lived in [place], [place], or [place] counties for at least the last three months. °  You cannot be eligible for state or federal sponsored healthcare insurance, including Veterans Administration, Medicaid, or Medicare. °  You generally cannot be eligible for healthcare insurance through your employer.  °  How to apply: °Eligibility screenings are held every Tuesday and Wednesday afternoon from 1:00 pm until 4:00 pm. You do not need an appointment for the interview!  °[facility] [address], [place], [place] 336-631-2330   °[facility]  336-342-8273   °[facility]  336-703-3100   °[facility]  336-570-6415   ° °Behavioral Health Resources in the Community: °Intensive Outpatient Programs °Organization         Address  Phone  Notes  °[facility] [address], [place], [place] 336-878-6098   °[facility] [address], [place], [place] 336-832-9800   °[facility] [address], [place], [place] ° 336-882-2125   °[facility] [address],  °[place], [place] 1-800-853-5163 or 336-641-4981   °Substance Abuse Resources °Organization         Address  Phone  Notes  °[facility]  336-882-2125   °[facility]  336-784-9470   °[facility]  336-285-9073   °[facility]  336-845-3988   °Residential & Outpatient Substance Abuse Program  1-800-659-3381   °Psychological Services °Organization         Address  Phone  Notes  °[facility]  336- 832-9600   °[facility]  336- 378-7881   °[facility] [address], [place] 1-800-853-5163 or 336-641-4981   ° °Mobile Crisis Teams °Organization         Address  Phone  Notes  °[facility], Mobile Crisis Care Unit  1-877-626-1772   °Assertive °[facility] ° [address]  [place], [place] 336-834-9664   °Sharon DeEsch [address] °[place] 336-554-5454   ° °Self-Help/Support Groups °Organization         Address  Phone             Notes  °[facility]  - variety of support groups  336- 373-1402 Call for more information  °Narcotics Anonymous (NA), [facility] [address], °[place]  2 meetings at this location  ° °  Residential Treatment Programs Organization         Address  Phone  Notes  [facility] [address],    [place]  1-917-395-8178   [facility]  [address] [place] [address], [place], [place]   [facility] [place], [place] [phone] Admissions: 8am-3pm M-F  [facility] [address],    [place], [place] 098-119-1478   [facility] [address] [place], [place], [place]   [facility] [address],  [place], [place]   [facility] Intensive Outpatient [address], [place], [place]   [facility].,  [place], [place] 1-(905) 537-9329 or (213)535-7416   [facility] [address] [place], [place] Accepts Medicaid  [facility] [address]  [place] 1-(450)552-4430 Substance Abuse/Addiction Treatment   [facility]         Address  Phone  Notes  [facility]  (775) 200-0738   Domenic Schwab, PhD [address] [place], [place]   (430)874-2315 or 670-591-5037   [place], [place] 5206983420   [facility] [address] [place], [place] 956-192-7060 Insurance/Medicaid/sponsorship through [facility] [address]                                    [place], [place] [phone] [place] [address], [place] [phone]    Dr. Adele Schilder  843-488-8050   [facility] Dept. 1) [address] [place] 2) [place] 3)  [address], [place] (825)[phone]  [phone]   [place] 316-101-2048 or 985-484-3415 (After Hours)       Take the new prescription as directed. You will be increasing your keppra to 500mg  by mouth twice a day. You were given an extra dose of keppra while you were in the Emergency Department. Call your regular medical doctor on Monday to schedule a follow up appointment within the next 2 days. Call your regular Neurologist on Monday to schedule a follow up appointment within the next week.  Return to the Emergency Department immediately sooner if worsening.

## 2014-02-25 NOTE — ED Notes (Signed)
Pt reports feeling nauseated after drinking 5cc of fluid. MD notified.

## 2014-02-25 NOTE — ED Provider Notes (Signed)
CSN: [id number]     Arrival date & time 02/25/14  49 History   First MD Initiated Contact with Patient 02/25/14 1340     Chief Complaint  Patient presents with  . Altered Mental Status     HPI Pt was seen at 1345.  Per EMS, family report and pt, c/o pt with gradual onset and persistence of constant AMS that family noticed approx 1115 PTA. Pt's family states they live next door to pt, last saw her last night approx 2200. When they went to check on her this morning, they noticed pt was "sitting on the couch not acting right." They state they let themselves in the door and noticed pt's "eyes darting all over" while she spoke with them stating she "felt confused." Pt was "stuttering her words." Pt states she woke up this morning, walked to the couch and "doesn't remember much after that," and "I just started to feel confused." Denies falling, no witnessed generalized tonic-clonic movements, no N/V/D, no incont of bowel/bladder, no recent fevers. Pt denies CP/palpitations, no SOB/cough, no abd pain, no headache, no neck or back pain, no visual changes, no focal motor weakness, no tingling/numbness in extremities.    Past Medical History  Diagnosis Date  . PTSD (post-traumatic stress disorder)   . Bipolar 1 disorder   . Heart murmur   . Diverticulosis of colon   . Keratosis, actinic   . Family hx of colon cancer     age 61-father  . GERD (gastroesophageal reflux disease)   . IBS (irritable bowel syndrome)   . Chronic nausea   . S/P endoscopy 05/30/2010    Dr Rourk-> non--critical Schatzki's ring, s/p 9F dilation  . S/P colonoscopy 05/30/2010    LAX sphincter tone, anal papilla, left-sided diverticulosis, normal random biopsies,, 1 polyp-TA  . Polysubstance abuse   . Tubular adenoma of colon 05/30/2010    Next colonoscopy 05/2015  . Anxiety   . Depression   . Hyperthyroidism   . Hyperlipidemia   . Folate deficiency   . Hx of abnormal Pap smear   . Low back pain 03/01/2013    MRI with multiple  levels of disc bulge  . Stroke     Right parietal  . Headache   . Chronic left hip pain   . Chronic neck pain    Past Surgical History  Procedure Laterality Date  . External ear surgery    . Neck surgery    . Hydrogen breath test  12/11/2011    Could not complete due to vomiting. Procedure: HYDROGEN BREATH TEST;  Surgeon: Daneil Dolin, MD;  Location: [facility];  Service: Endoscopy;  Laterality: N/A;  for Bacterial Overgrowth/8:30   Family History  Problem Relation Age of Onset  . Diabetes Mother   . Hypertension Mother   . Colon cancer Father 62  . Hypertension Father   . Hypertension Sister   . Heart attack Brother    History  Substance Use Topics  . Smoking status: Current Every Day Smoker -- 0.50 packs/day    Types: Cigarettes  . Smokeless tobacco: Never Used  . Alcohol Use: No   OB History   Grav Para Term Preterm Abortions TAB SAB Ect Mult Living   2 1 1  1  1   1      Review of Systems ROS: Statement: All systems negative except as marked or noted in the HPI; Constitutional: Negative for fever and chills. ; ; Eyes: Negative for eye pain, redness  and discharge. ; ; ENMT: Negative for ear pain, hoarseness, nasal congestion, sinus pressure and sore throat. ; ; Cardiovascular: Negative for chest pain, palpitations, diaphoresis, dyspnea and peripheral edema. ; ; Respiratory: Negative for cough, wheezing and stridor. ; ; Gastrointestinal: Negative for nausea, vomiting, diarrhea, abdominal pain, blood in stool, hematemesis, jaundice and rectal bleeding. . ; ; Genitourinary: Negative for dysuria, flank pain and hematuria. ; ; Musculoskeletal: Negative for back pain and neck pain. Negative for swelling and trauma.; ; Skin: Negative for pruritus, rash, abrasions, blisters, bruising and skin lesion.; ; Neuro: +AMS. Negative for headache, lightheadedness and neck stiffness. Negative for weakness, extremity weakness, paresthesias, and syncope.     Allergies  Review of patient's  allergies indicates no known allergies.  Home Medications   Current Outpatient Rx  Name  Route  Sig  Dispense  Refill  . aspirin 81 MG tablet   Oral   Take 81 mg by mouth daily.         . budesonide-formoterol (SYMBICORT) 160-4.5 MCG/ACT inhaler   Inhalation   Inhale 2 puffs into the lungs 2 (two) times daily as needed (shortness of breath).         . clonazePAM (KLONOPIN) 1 MG tablet   Oral   Take 0.5-2 mg by mouth 3 (three) times daily. Takes 2mg  in am ,  2mg  at 3 pm and 0.5 mg in pm.         . dexlansoprazole (DEXILANT) 60 MG capsule   Oral   Take 1 capsule (60 mg total) by mouth at bedtime.   30 capsule   1   . folic acid (FOLVITE) 147 MCG tablet   Oral   Take 400 mcg by mouth daily.         Marland Kitchen HYDROcodone-acetaminophen (NORCO/VICODIN) 5-325 MG per tablet   Oral   Take 1 tablet by mouth every 6 (six) hours as needed for moderate pain.   20 tablet   0   . ibuprofen (ADVIL,MOTRIN) 800 MG tablet   Oral   Take 400 mg by mouth 2 (two) times daily as needed (pain, headache).         . lamoTRIgine (LAMICTAL) 100 MG tablet   Oral   Take 200 mg by mouth 2 (two) times daily. Takes 200mg  with 25mg  for a total of 225mg  twice daily         . lamoTRIgine (LAMICTAL) 25 MG tablet   Oral   Take 25 mg by mouth 2 (two) times daily. Takes 200mg  with 25mg  for a total of 225mg  twice daily         . levETIRAcetam (KEPPRA) 250 MG tablet   Oral   Take 250 mg by mouth 2 (two) times daily.         Marland Kitchen levothyroxine (SYNTHROID, LEVOTHROID) 50 MCG tablet      TAKE ONE TABLET DAILY.   30 tablet   5     Dispense as written.   . mirabegron ER (MYRBETRIQ) 25 MG TB24 tablet   Oral   Take 25 mg by mouth daily.         Marland Kitchen omeprazole (PRILOSEC) 40 MG capsule   Oral   Take 1 capsule (40 mg total) by mouth daily.   30 capsule   11   . ondansetron (ZOFRAN) 4 MG tablet   Oral   Take 1 tablet (4 mg total) by mouth every 6 (six) hours.   12 tablet   0   .  oxyCODONE-acetaminophen (  PERCOCET/ROXICET) 5-325 MG per tablet   Oral   Take 2 tablets by mouth every 6 (six) hours as needed for severe pain.   15 tablet   0   . Probiotic Product (PROBIOTIC PO)   Oral   Take 1 tablet by mouth daily.          . traZODone (DESYREL) 100 MG tablet   Oral   Take 100 mg by mouth at bedtime.            BP 147/85  Pulse 76  Temp(Src) 98.4 F (36.9 C) (Rectal)  Resp 12  SpO2 96% Physical Exam 1350: Physical examination:  Nursing notes reviewed; Vital signs and O2 SAT reviewed;  Constitutional: Well developed, Well nourished, Well hydrated, In no acute distress; Head:  Normocephalic, atraumatic; Eyes: EOMI, PERRL, No scleral icterus; ENMT: Mouth and pharynx normal, Mucous membranes moist; Neck: Supple, Full range of motion, No lymphadenopathy; Cardiovascular: Regular rate and rhythm, No murmur, rub, or gallop; Respiratory: Breath sounds clear & equal bilaterally, No rales, rhonchi, wheezes.  Speaking full sentences with ease, Normal respiratory effort/excursion; Chest: Nontender, Movement normal; Abdomen: Soft, Nontender, Nondistended, Normal bowel sounds; Genitourinary: No CVA tenderness; Extremities: Pulses normal, No tenderness, No edema, No calf edema or asymmetry.; Neuro: Lethargic, but opens her eyes to name. Pt stutters several words while speaking, but speech otherwise clear and appropriate. Major CN grossly intact.  No facial droop. Moves all extremities on stretcher spontaneously and to command without apparent gross focal motor deficits.; Skin: Color normal, Warm, Dry.   ED Course  Procedures     EKG Interpretation   Date/Time:  Saturday February 25 2014 13:47:09 EDT Ventricular Rate:  52 PR Interval:  190 QRS Duration: 78 QT Interval:  410 QTC Calculation: 381 R Axis:   56 Text Interpretation:  Sinus bradycardia Otherwise normal ECG When compared  with ECG of 25-May-2013 18:55, No significant change was found Confirmed  by [facility]  MD,  Nunzio Cory [phone]) on 02/25/2014 2:20:52 PM      MDM  MDM Reviewed: previous chart, nursing note and vitals Reviewed previous: labs, CT scan and ECG Interpretation: labs, ECG, x-ray and CT scan    Results for orders placed during the hospital encounter of 02/25/14  URINE RAPID DRUG SCREEN (HOSP PERFORMED)      Result Value Ref Range   Opiates NONE DETECTED  NONE DETECTED   Cocaine NONE DETECTED  NONE DETECTED   Benzodiazepines NONE DETECTED  NONE DETECTED   Amphetamines NONE DETECTED  NONE DETECTED   Tetrahydrocannabinol NONE DETECTED  NONE DETECTED   Barbiturates NONE DETECTED  NONE DETECTED  ETHANOL      Result Value Ref Range   Alcohol, Ethyl (B) <11  0 - 11 mg/dL  ACETAMINOPHEN LEVEL      Result Value Ref Range   Acetaminophen (Tylenol), Serum <15.0  10 - 30 ug/mL  SALICYLATE LEVEL      Result Value Ref Range   Salicylate Lvl <3.8 (*) 2.8 - 20.0 mg/dL  URINALYSIS, ROUTINE W REFLEX MICROSCOPIC      Result Value Ref Range   Color, Urine YELLOW  YELLOW   APPearance CLEAR  CLEAR   Specific Gravity, Urine 1.010  1.005 - 1.030   pH 6.0  5.0 - 8.0   Glucose, UA NEGATIVE  NEGATIVE mg/dL   Hgb urine dipstick NEGATIVE  NEGATIVE   Bilirubin Urine NEGATIVE  NEGATIVE   Ketones, ur NEGATIVE  NEGATIVE mg/dL   Protein, ur NEGATIVE  NEGATIVE mg/dL  Urobilinogen, UA 0.2  0.0 - 1.0 mg/dL   Nitrite NEGATIVE  NEGATIVE   Leukocytes, UA NEGATIVE  NEGATIVE  CBC WITH DIFFERENTIAL      Result Value Ref Range   WBC 5.7  4.0 - 10.5 K/uL   RBC 4.92  3.87 - 5.11 MIL/uL   Hemoglobin 14.8  12.0 - 15.0 g/dL   HCT 45.0  36.0 - 46.0 %   MCV 91.5  78.0 - 100.0 fL   MCH 30.1  26.0 - 34.0 pg   MCHC 32.9  30.0 - 36.0 g/dL   RDW 13.4  11.5 - 15.5 %   Platelets 220  150 - 400 K/uL   Neutrophils Relative % 63  43 - 77 %   Neutro Abs 3.6  1.7 - 7.7 K/uL   Lymphocytes Relative 27  12 - 46 %   Lymphs Abs 1.6  0.7 - 4.0 K/uL   Monocytes Relative 7  3 - 12 %   Monocytes Absolute 0.4  0.1 - 1.0 K/uL    Eosinophils Relative 2  0 - 5 %   Eosinophils Absolute 0.1  0.0 - 0.7 K/uL   Basophils Relative 0  0 - 1 %   Basophils Absolute 0.0  0.0 - 0.1 K/uL  COMPREHENSIVE METABOLIC PANEL      Result Value Ref Range   Sodium 142  137 - 147 mEq/L   Potassium 4.0  3.7 - 5.3 mEq/L   Chloride 104  96 - 112 mEq/L   CO2 27  19 - 32 mEq/L   Glucose, Bld 110 (*) 70 - 99 mg/dL   BUN 8  6 - 23 mg/dL   Creatinine, Ser 1.12 (*) 0.50 - 1.10 mg/dL   Calcium 9.7  8.4 - 10.5 mg/dL   Total Protein 7.3  6.0 - 8.3 g/dL   Albumin 4.0  3.5 - 5.2 g/dL   AST 18  0 - 37 U/L   ALT 10  0 - 35 U/L   Alkaline Phosphatase 119 (*) 39 - 117 U/L   Total Bilirubin 0.3  0.3 - 1.2 mg/dL   GFR calc non Af Amer 56 (*) >90 mL/min   GFR calc Af Amer 65 (*) >90 mL/min  LIPASE, BLOOD      Result Value Ref Range   Lipase 31  11 - 59 U/L  TROPONIN I      Result Value Ref Range   Troponin I <0.30  <0.30 ng/mL  LACTIC ACID, PLASMA      Result Value Ref Range   Lactic Acid, Venous 0.9  0.5 - 2.2 mmol/L  PROTIME-INR      Result Value Ref Range   Prothrombin Time 11.7  11.6 - 15.2 seconds   INR 0.87  0.00 - 1.49  CBG MONITORING, ED      Result Value Ref Range   Glucose-Capillary 96  70 - 99 mg/dL   Dg Chest 1 View 02/25/2014   CLINICAL DATA:  Altered mental status  EXAM: CHEST - 1 VIEW  COMPARISON:  06/29/2013.  FINDINGS: The heart size and mediastinal contours are within normal limits. Both lungs are clear. The visualized skeletal structures are unremarkable.  IMPRESSION: No acute cardiopulmonary findings   Electronically Signed   By: Kalman Jewels M.D.   On: 02/25/2014 14:20   Ct Head Wo Contrast 02/25/2014   CLINICAL DATA:  Altered mental status  EXAM: CT HEAD WITHOUT CONTRAST  TECHNIQUE: Contiguous axial images were obtained from the base of the  skull through the vertex without intravenous contrast. Study was obtained within 24 hr of patient's arrival at the emergency department.  COMPARISON:  July 19, 2013  FINDINGS: The  ventricles are normal in size and configuration. However, there is mild frontal atrophy bilaterally which is stable. There is no mass, hemorrhage, extra-axial fluid collection, or midline shift. There is evidence of a prior infarct at the junction of the right posterior temporal -anterior occipital -inferior parietal lobes with extension into the mid right parietal lobe. This finding is stable. There is no new gray-white compartment lesion. No acute infarct apparent. Bony calvarium appears intact. The mastoid air cells are clear.  IMPRESSION: Prior infarct on the right at the temporal -occipital -parietal junction with extension into the mid right parietal lobe, stable. The gray-white compartments are otherwise normal. No intracranial mass, hemorrhage, or acute appearing infarct.   Electronically Signed   By: Lowella Grip M.D.   On: 02/25/2014 14:11    1715:  After sleeping for most of her ED visit, pt is now awake/alert, NAD, resps easy. Neuro exam re-checked: A&O, no facial droop, speech clear, strength 5/5 equal bilat UE's and LE's, climbs on and off stretcher easily by herself. Gait steady. VSS, not orthostatic, remains afebrile. Has tol PO well without N/V.  Pt wants to go home now.  Question seizure/post-ictal episode as cause for events this morning. T/C to [facility] Neuro Dr. Nicole [facility]red, case discussed, including:  HPI, pertinent PM/SHx, VS/PE, dx testing, ED course and treatment:  Agrees with ED workup, as well as agrees that this episode was likely seizure/post-ictal event vs CVA, requests to increase pt's keppra to 500mg  PO BID, f/u ofc this week. Dx and testing, as well as d/w Neuro MD, d/w pt and family.  Questions answered.  Verb understanding, agreeable to d/c home with outpt f/u. Will dose keppra 250mg  PO before d/c.    Alfonzo Feller, DO 02/28/14 1110

## 2014-02-25 NOTE — ED Notes (Signed)
Pt's neighbor states she found pt at home around 11:15 this morning with altered mental status, slurred speech. Pt is alert to speech but disoriented to time and place.

## 2014-02-25 NOTE — ED Notes (Signed)
Pt tolerated 30 ounces of fluid and crackers without nausea or vomiting.  Pt received discharge instructions and prescriptions, verbalized understanding and has no further questions. Pt ambulated to exit in stable condition accompanied by sister. Pt denies dizziness, nausea and pain.  Advised to return to emergency department with new or worsening symptoms.

## 2014-02-25 NOTE — ED Notes (Signed)
Pt ambulated approximately 50 ft with slow steady gait. Reported feeling "light-headed." MD notified.

## 2014-02-26 LAB — URINE CULTURE: Colony Count: 15000

## 2014-02-28 NOTE — Telephone Encounter (Signed)
PA approved.   02/03/2014- 02/03/2015.  # [id number].

## 2014-03-14 ENCOUNTER — Telehealth: Payer: Self-pay | Admitting: *Deleted

## 2014-03-14 DIAGNOSIS — I639 Cerebral infarction, unspecified: Secondary | ICD-10-CM

## 2014-03-14 DIAGNOSIS — Z8673 Personal history of transient ischemic attack (TIA), and cerebral infarction without residual deficits: Secondary | ICD-10-CM

## 2014-03-14 DIAGNOSIS — D126 Benign neoplasm of colon, unspecified: Secondary | ICD-10-CM

## 2014-03-14 DIAGNOSIS — M79609 Pain in unspecified limb: Secondary | ICD-10-CM

## 2014-03-14 NOTE — Telephone Encounter (Signed)
Patient stated she went to ER on 4/14 for seizures, and ER doc increased her levETIRAcetam (KEPPRA) 500 MG tablet to 1000 mg.  She's very fatigued and very concerned about dosage increase.  Please call patient at earliest convenience.  Thanks

## 2014-03-16 ENCOUNTER — Telehealth: Payer: Self-pay | Admitting: Neurology

## 2014-03-16 MED ORDER — LEVETIRACETAM 250 MG PO TABS: ORAL_TABLET | ORAL | Status: DC

## 2014-03-16 NOTE — Telephone Encounter (Signed)
I have talked with her, she went to ED in April 4th, had seizure, she was taking Keppra 250mg  bid, dosage was increased to 1000mg  /day, she complains of excessive sleepiness,  She is also taking Lamictal 225mg /day, trazadone 100mg  qhs.   It is ok for her to take keppra 750mg  /day (250mg  tid).  Keep follow up in  Apr 13, 2014

## 2014-03-16 NOTE — Telephone Encounter (Signed)
Refill her keppra 250mg  90 tabs

## 2014-03-25 ENCOUNTER — Other Ambulatory Visit: Payer: Self-pay | Admitting: Orthopedic Surgery

## 2014-04-05 ENCOUNTER — Encounter (HOSPITAL_COMMUNITY): Payer: Self-pay | Admitting: Pharmacy Technician

## 2014-04-11 ENCOUNTER — Encounter (INDEPENDENT_AMBULATORY_CARE_PROVIDER_SITE_OTHER): Payer: Self-pay

## 2014-04-11 ENCOUNTER — Encounter (HOSPITAL_COMMUNITY): Payer: Self-pay

## 2014-04-11 ENCOUNTER — Ambulatory Visit (HOSPITAL_COMMUNITY)
Admission: RE | Admit: 2014-04-11 | Discharge: 2014-04-11 | Disposition: A | Discharge status: Home / Self Care | Payer: Medicaid Other | Source: Ambulatory Visit | Attending: Orthopedic Surgery | Admitting: Orthopedic Surgery

## 2014-04-11 ENCOUNTER — Encounter (HOSPITAL_COMMUNITY)
Admission: RE | Admit: 2014-04-11 | Discharge: 2014-04-11 | Disposition: A | Discharge status: Home / Self Care | Payer: Medicaid Other | Source: Ambulatory Visit | Attending: Orthopedic Surgery | Admitting: Orthopedic Surgery

## 2014-04-11 DIAGNOSIS — Z01818 Encounter for other preprocedural examination: Secondary | ICD-10-CM | POA: Insufficient documentation

## 2014-04-11 DIAGNOSIS — M87059 Idiopathic aseptic necrosis of unspecified femur: Secondary | ICD-10-CM | POA: Insufficient documentation

## 2014-04-11 DIAGNOSIS — Z01812 Encounter for preprocedural laboratory examination: Secondary | ICD-10-CM | POA: Insufficient documentation

## 2014-04-11 HISTORY — DX: Idiopathic aseptic necrosis of unspecified femur: M87.059

## 2014-04-11 HISTORY — DX: Urinary tract infection, site not specified: N39.0

## 2014-04-11 HISTORY — DX: Unspecified convulsions: R56.9

## 2014-04-11 LAB — COMPREHENSIVE METABOLIC PANEL
ALBUMIN: 3.9 g/dL (ref 3.5–5.2)
ALT: 9 U/L (ref 0–35)
AST: 12 U/L (ref 0–37)
Alkaline Phosphatase: 109 U/L (ref 39–117)
BUN: 8 mg/dL (ref 6–23)
CALCIUM: 9.3 mg/dL (ref 8.4–10.5)
CO2: 25 mEq/L (ref 19–32)
Chloride: 102 mEq/L (ref 96–112)
Creatinine, Ser: 1.02 mg/dL (ref 0.50–1.10)
GFR calc Af Amer: 73 mL/min — ABNORMAL LOW (ref >90)
GFR calc non Af Amer: 63 mL/min — ABNORMAL LOW (ref >90)
Glucose, Bld: 97 mg/dL (ref 70–99)
Potassium: 4 mEq/L (ref 3.7–5.3)
Sodium: 139 mEq/L (ref 137–147)
Total Bilirubin: 0.3 mg/dL (ref 0.3–1.2)
Total Protein: 6.3 g/dL (ref 6.0–8.3)

## 2014-04-11 LAB — URINALYSIS, ROUTINE W REFLEX MICROSCOPIC
Bilirubin Urine: NEGATIVE
Glucose, UA: NEGATIVE mg/dL
Hgb urine dipstick: NEGATIVE
Ketones, ur: NEGATIVE mg/dL
Leukocytes, UA: NEGATIVE
Nitrite: POSITIVE — AB
Protein, ur: NEGATIVE mg/dL
SPECIFIC GRAVITY, URINE: 1.004 — AB (ref 1.005–1.030)
UROBILINOGEN UA: 1 mg/dL (ref 0.0–1.0)
pH: 6 (ref 5.0–8.0)

## 2014-04-11 LAB — CBC
HCT: 41.1 % (ref 36.0–46.0)
Hemoglobin: 13.7 g/dL (ref 12.0–15.0)
MCH: 29.1 pg (ref 26.0–34.0)
MCHC: 33.3 g/dL (ref 30.0–36.0)
MCV: 87.4 fL (ref 78.0–100.0)
PLATELETS: 202 10*3/uL (ref 150–400)
RBC: 4.7 MIL/uL (ref 3.87–5.11)
RDW: 12.8 % (ref 11.5–15.5)
WBC: 8.4 10*3/uL (ref 4.0–10.5)

## 2014-04-11 LAB — APTT: aPTT: 30 seconds (ref 24–37)

## 2014-04-11 LAB — SURGICAL PCR SCREEN
MRSA, PCR: NEGATIVE
STAPHYLOCOCCUS AUREUS: NEGATIVE

## 2014-04-11 LAB — PROTIME-INR
INR: 0.9 (ref 0.00–1.49)
Prothrombin Time: 12 seconds (ref 11.6–15.2)

## 2014-04-11 LAB — URINE MICROSCOPIC-ADD ON

## 2014-04-11 NOTE — Patient Instructions (Signed)
Sheryl Hall  04/11/2014   Your procedure is scheduled on: 04/19/14  Report to [facility] at 7:30 AM.  Call this number if you have problems the morning of surgery 336-: [phone]   Remember: please bring inhaler on day of surgery   Do not eat food or drink liquids After Midnight.     Take these medicines the morning of surgery with A SIP OF WATER: klonopin, inhaler, synthroid, keppra, lamictal   Do not wear jewelry, make-up or nail polish.  Do not wear lotions, powders, or perfumes. You may wear deodorant.  Do not shave 48 hours prior to surgery. Men may shave face and neck.  Do not bring valuables to the hospital.  Contacts, dentures or bridgework may not be worn into surgery.  Leave suitcase in the car. After surgery it may be brought to your room.  For patients admitted to the hospital, checkout time is 11:00 AM the day of discharge.    Please read over the following fact sheets that you were given: MRSA Information Paulette Blanch, RN  pre op nurse call if needed [phone]    [facility] - Preparing for Surgery Before surgery, you can play an important role.  Because skin is not sterile, your skin needs to be as free of germs as possible.  You can reduce the number of germs on your skin by washing with CHG (chlorahexidine gluconate) soap before surgery.  CHG is an antiseptic cleaner which kills germs and bonds with the skin to continue killing germs even after washing. Please DO NOT use if you have an allergy to CHG or antibacterial soaps.  If your skin becomes reddened/irritated stop using the CHG and inform your nurse when you arrive at Short Stay. Do not shave (including legs and underarms) for at least 48 hours prior to the first CHG shower.  You may shave your face/neck. Please follow these instructions carefully:  1.  Shower with CHG Soap the night before surgery and the  morning of Surgery.  2.  If you choose to wash your hair, wash your hair first  as usual with your  normal  shampoo.  3.  After you shampoo, rinse your hair and body thoroughly to remove the  shampoo.                            4.  Use CHG as you would any other liquid soap.  You can apply chg directly  to the skin and wash                       Gently with a scrungie or clean washcloth.  5.  Apply the CHG Soap to your body ONLY FROM THE NECK DOWN.   Do not use on face/ open                           Wound or open sores. Avoid contact with eyes, ears mouth and genitals (private parts).                       Wash face,  Genitals (private parts) with your normal soap.             6.  Wash thoroughly, paying special attention to the area where your surgery  will be performed.  7.  Thoroughly rinse your body  with warm water from the neck down.  8.  DO NOT shower/wash with your normal soap after using and rinsing off  the CHG Soap.                9.  Pat yourself dry with a clean towel.            10.  Wear clean pajamas.            11.  Place clean sheets on your bed the night of your first shower and do not  sleep with pets. Day of Surgery : Do not apply any lotions the morning of surgery.  Please wear clean clothes to the hospital/surgery center.  FAILURE TO FOLLOW THESE INSTRUCTIONS MAY RESULT IN THE CANCELLATION OF YOUR SURGERY PATIENT SIGNATURE_________________________________  NURSE SIGNATURE__________________________________  ________________________________________________________________________   Sheryl Hall  An incentive spirometer is a tool that can help keep your lungs clear and active. This tool measures how well you are filling your lungs with each breath. Taking long deep breaths may help reverse or decrease the chance of developing breathing (pulmonary) problems (especially infection) following:  A long period of time when you are unable to move or be active. BEFORE THE PROCEDURE   If the spirometer includes an indicator to show your best  effort, your nurse or respiratory therapist will set it to a desired goal.  If possible, sit up straight or lean slightly forward. Try not to slouch.  Hold the incentive spirometer in an upright position. INSTRUCTIONS FOR USE  1. Sit on the edge of your bed if possible, or sit up as far as you can in bed or on a chair. 2. Hold the incentive spirometer in an upright position. 3. Breathe out normally. 4. Place the mouthpiece in your mouth and seal your lips tightly around it. 5. Breathe in slowly and as deeply as possible, raising the piston or the ball toward the top of the column. 6. Hold your breath for 3-5 seconds or for as long as possible. Allow the piston or ball to fall to the bottom of the column. 7. Remove the mouthpiece from your mouth and breathe out normally. 8. Rest for a few seconds and repeat Steps 1 through 7 at least 10 times every 1-2 hours when you are awake. Take your time and take a few normal breaths between deep breaths. 9. The spirometer may include an indicator to show your best effort. Use the indicator as a goal to work toward during each repetition. 10. After each set of 10 deep breaths, practice coughing to be sure your lungs are clear. If you have an incision (the cut made at the time of surgery), support your incision when coughing by placing a pillow or rolled up towels firmly against it. Once you are able to get out of bed, walk around indoors and cough well. You may stop using the incentive spirometer when instructed by your caregiver.  RISKS AND COMPLICATIONS  Take your time so you do not get dizzy or light-headed.  If you are in pain, you may need to take or ask for pain medication before doing incentive spirometry. It is harder to take a deep breath if you are having pain. AFTER USE  Rest and breathe slowly and easily.  It can be helpful to keep track of a log of your progress. Your caregiver can provide you with a simple table to help with this. If you  are using the spirometer at home, follow  these instructions: SEEK MEDICAL CARE IF:   You are having difficultly using the spirometer.  You have trouble using the spirometer as often as instructed.  Your pain medication is not giving enough relief while using the spirometer.  You develop fever of 100.5 F (38.1 C) or higher. SEEK IMMEDIATE MEDICAL CARE IF:   You cough up bloody sputum that had not been present before.  You develop fever of 102 F (38.9 C) or greater.  You develop worsening pain at or near the incision site. MAKE SURE YOU:   Understand these instructions.  Will watch your condition.  Will get help right away if you are not doing well or get worse. Document Released: 03/23/2007 Document Revised: 02/02/2012 Document Reviewed: 05/24/2007 Exit[employer] Patient Information 2014 ExitCare, [place].   ________________________________________________________________________  WHAT IS A BLOOD TRANSFUSION? Blood Transfusion Information  A transfusion is the replacement of blood or some of its parts. Blood is made up of multiple cells which provide different functions.  Red blood cells carry oxygen and are used for blood loss replacement.  White blood cells fight against infection.  Platelets control bleeding.  Plasma helps clot blood.  Other blood products are available for specialized needs, such as hemophilia or other clotting disorders. BEFORE THE TRANSFUSION  Who gives blood for transfusions?   Healthy volunteers who are fully evaluated to make sure their blood is safe. This is blood bank blood. Transfusion therapy is the safest it has ever been in the practice of medicine. Before blood is taken from a donor, a complete history is taken to make sure that person has no history of diseases nor engages in risky social behavior (examples are intravenous drug use or sexual activity with multiple partners). The donor's travel history is screened to minimize risk of  transmitting infections, such as malaria. The donated blood is tested for signs of infectious diseases, such as HIV and hepatitis. The blood is then tested to be sure it is compatible with you in order to minimize the chance of a transfusion reaction. If you or a relative donates blood, this is often done in anticipation of surgery and is not appropriate for emergency situations. It takes many days to process the donated blood. RISKS AND COMPLICATIONS Although transfusion therapy is very safe and saves many lives, the main dangers of transfusion include:   Getting an infectious disease.  Developing a transfusion reaction. This is an allergic reaction to something in the blood you were given. Every precaution is taken to prevent this. The decision to have a blood transfusion has been considered carefully by your caregiver before blood is given. Blood is not given unless the benefits outweigh the risks. AFTER THE TRANSFUSION  Right after receiving a blood transfusion, you will usually feel much better and more energetic. This is especially true if your red blood cells have gotten low (anemic). The transfusion raises the level of the red blood cells which carry oxygen, and this usually causes an energy increase.  The nurse administering the transfusion will monitor you carefully for complications. HOME CARE INSTRUCTIONS  No special instructions are needed after a transfusion. You may find your energy is better. Speak with your caregiver about any limitations on activity for underlying diseases you may have. SEEK MEDICAL CARE IF:   Your condition is not improving after your transfusion.  You develop redness or irritation at the intravenous (IV) site. SEEK IMMEDIATE MEDICAL CARE IF:  Any of the following symptoms occur over the next 12 hours:  Shaking chills.  You have a temperature by mouth above 102 F (38.9 C), not controlled by medicine.  Chest, back, or muscle pain.  People around you  feel you are not acting correctly or are confused.  Shortness of breath or difficulty breathing.  Dizziness and fainting.  You get a rash or develop hives.  You have a decrease in urine output.  Your urine turns a dark color or changes to pink, red, or brown. Any of the following symptoms occur over the next 10 days:  You have a temperature by mouth above 102 F (38.9 C), not controlled by medicine.  Shortness of breath.  Weakness after normal activity.  The white part of the eye turns yellow (jaundice).  You have a decrease in the amount of urine or are urinating less often.  Your urine turns a dark color or changes to pink, red, or brown. Document Released: 11/07/2000 Document Revised: 02/02/2012 Document Reviewed: 06/26/2008 [facility] Patient Information 2014 [place], [place].  _______________________________________________________________________

## 2014-04-11 NOTE — Progress Notes (Signed)
Chest x-ray 02/25/14 on EPIC, EKG 02/27/14 on EPIC

## 2014-04-13 ENCOUNTER — Encounter: Payer: Self-pay | Admitting: Neurology

## 2014-04-13 ENCOUNTER — Ambulatory Visit (INDEPENDENT_AMBULATORY_CARE_PROVIDER_SITE_OTHER): Payer: Medicaid Other | Admitting: Neurology

## 2014-04-13 VITALS — BP 108/72 | HR 76 | Wt 159.0 lb

## 2014-04-13 DIAGNOSIS — G40209 Localization-related (focal) (partial) symptomatic epilepsy and epileptic syndromes with complex partial seizures, not intractable, without status epilepticus: Secondary | ICD-10-CM

## 2014-04-13 NOTE — Progress Notes (Signed)
Reason for visit: Seizures  Sheryl Hall is an 53 y.o. female  History of present illness:  [name] is a 53 year old right-handed white female with a history of cerebrovascular disease. 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 EEG study at that time was abnormal, and she has been placed on Keppra. The patient went to the emergency room on 02/25/2014 with an episode of confusion coming out of sleep. The episode lasted over one hour, and resolved after she got to the emergency room. The patient was increased on the Perry taking 1000 mg daily, but she could not tolerate the dose secondary to increased drowsiness. The patient has chronic insomnia at night, and she feels fatigued during the day. The patient has trazodone to take at night with 100 mg tablets. This is not effective for sleep, but if she takes 150 mg at night, she does sleep better. The patient is currently on Keppra taking 250 mg in the morning and 500 mg in the evening. The patient returns to this office for an evaluation.  Past Medical History  Diagnosis Date  . PTSD (post-traumatic stress disorder)   . Bipolar 1 disorder   . Heart murmur   . Diverticulosis of colon   . Keratosis, actinic   . Family hx of colon cancer     age 25-father  . GERD (gastroesophageal reflux disease)   . Chronic nausea   . S/P endoscopy 05/30/2010    Dr Rourk-> non--critical Schatzki's ring, s/p 89F dilation  . S/P colonoscopy 05/30/2010    LAX sphincter tone, anal papilla, left-sided diverticulosis, normal random biopsies,, 1 polyp-TA  . Polysubstance abuse   . Tubular adenoma of colon 05/30/2010    Next colonoscopy 05/2015  . Anxiety   . Depression   . Hyperthyroidism   . Hyperlipidemia   . Folate deficiency   . Hx of abnormal Pap smear   . Low back pain 03/01/2013    MRI with multiple levels of disc bulge  . Headache   . Chronic left hip pain   . Avascular necrosis  of hip     left  . Stroke 2014    Right parietal  . Seizures 02/25/14  . UTI (urinary tract infection)     Past Surgical History  Procedure Laterality Date  . External ear surgery Left 12 years ago    skin graft from behind ear put in ear canal  . Neck surgery  10 years ago  . Hydrogen breath test  12/11/2011    Could not complete due to vomiting. Procedure: HYDROGEN BREATH TEST;  Surgeon: Daneil Dolin, MD;  Location: [facility];  Service: Endoscopy;  Laterality: N/A;  for Bacterial Overgrowth/8:30  . Colonoscopy      every 5 years  . Skin cancer destruction      Family History  Problem Relation Age of Onset  . Diabetes Mother   . Hypertension Mother   . Colon cancer Father 61  . Hypertension Father   . Hypertension Sister   . Heart attack Brother     Social history:  reports that she has been smoking Cigarettes.  She has a 18 pack-year smoking history. She has never used smokeless tobacco. She reports that she drinks alcohol. She reports that she does not use illicit drugs.   No Known Allergies  Medications:  Current Outpatient Prescriptions on File Prior to Visit  Medication Sig  Dispense Refill  . aspirin 81 MG tablet Take 81 mg by mouth daily.      . budesonide-formoterol (SYMBICORT) 160-4.5 MCG/ACT inhaler Inhale 1 puff into the lungs 2 (two) times daily.       . clonazePAM (KLONOPIN) 1 MG tablet Take 0.5-1 mg by mouth 3 (three) times daily. Takes 1mg  in am ,  1mg  at 3 pm and 0.5 mg at bedtime      . dexlansoprazole (DEXILANT) 60 MG capsule Take 60 mg by mouth at bedtime.      . folic acid (FOLVITE) 831 MCG tablet Take 400 mcg by mouth daily.      Marland Kitchen ibuprofen (ADVIL,MOTRIN) 800 MG tablet Take 400 mg by mouth 2 (two) times daily as needed for mild pain (pain, headache).       . lamoTRIgine (LAMICTAL) 100 MG tablet Take 225 mg by mouth every morning. Takes 200mg  with 25mg  for a total of 225mg  daily      . lamoTRIgine (LAMICTAL) 25 MG tablet Take 50 mg by mouth every  morning. Takes 200mg  with 25mg  for a total of 225mg  daily      . levETIRAcetam (KEPPRA) 250 MG tablet Take 250-500 mg by mouth 2 (two) times daily. Takes 250 mg in the morning and 500 mg at night total 750 mg.      . levothyroxine (SYNTHROID, LEVOTHROID) 50 MCG tablet Take 50 mcg by mouth daily before breakfast.      . Probiotic Product (PROBIOTIC PO) Take 1 tablet by mouth daily.       . traZODone (DESYREL) 100 MG tablet Take 100 mg by mouth at bedtime.         No current facility-administered medications on file prior to visit.    ROS:  Out of a complete 14 system review of symptoms, the patient complains only of the following symptoms, and all other reviewed systems are negative.  Fatigue Heart murmur Joint pain, joint swelling, back pain, walking difficulties  Headache, seizures Agitation, behavior problem, confusion, depression, anxiety  Blood pressure 108/72, pulse 76, weight 159 lb (72.122 kg).  Physical Exam  General: The patient is alert and cooperative at the time of the examination.  Skin: No significant peripheral edema is noted.   Neurologic Exam  Mental status: The patient is oriented x 3.  Cranial nerves: Facial symmetry is present. Speech is normal, no aphasia or dysarthria is noted. Extraocular movements are full. Visual fields are full.  Motor: The patient has good strength in all 4 extremities.  Sensory examination: Soft touch sensation is symmetric on the face, arms, and legs.  Coordination: The patient has good finger-nose-finger and heel-to-shin bilaterally.  Gait and station: The patient has a normal gait. Tandem gait is unsteady. Romberg is negative. No drift is seen.  Reflexes: Deep tendon reflexes are symmetric.   Assessment/Plan:  1. Confusional state, possible seizure  2. Cerebrovascular disease  3. Avascular necrosis of the hips bilaterally, left total hip replacement pending   The patient will be going for a total hip replacement in  the near future. She will maintain the Keppra dose at 250 mg the morning, 500 mg in the evening. The patient will likely need to go up on her trazodone dose taking 150 mg or 200 mg at night to help her sleep. I suspect that this dose adjustment will help her feel more rested during the day. The patient otherwise will followup through this office in 6 months.   Jill Alexanders MD 04/13/2014  8:50 PM  [facility] [address] [place], [place] [address]  Phone [phone] Fax [phone]

## 2014-04-13 NOTE — Patient Instructions (Signed)
Epilepsy Epilepsy is a disorder in which a person has repeated seizures over time. A seizure is a release of abnormal electrical activity in the brain. Seizures can cause a change in attention, behavior, or the ability to remain awake and alert (altered mental status). Seizures often involve uncontrollable shaking (convulsions).  Most people with epilepsy lead normal lives. However, people with epilepsy are at an increased risk of falls, accidents, and injuries. Therefore, it is important to begin treatment right away. CAUSES  Epilepsy has many possible causes. Anything that disturbs the normal pattern of brain cell activity can lead to seizures. This may include:   Head injury.  Birth trauma.  High fever as a child.  Stroke.  Bleeding into or around the brain.  Certain drugs.  Prolonged low oxygen, such as what occurs after CPR efforts.  Abnormal brain development.  Certain illnesses, such as meningitis, encephalitis (brain infection), malaria, and other infections.  An imbalance of nerve signaling chemicals (neurotransmitters).  SIGNS AND SYMPTOMS  The symptoms of a seizure can vary greatly from one person to another. Right before a seizure, you may have a warning (aura) that a seizure is about to occur. An aura may include the following symptoms:  Fear or anxiety.  Nausea.  Feeling like the room is spinning (vertigo).  Vision changes, such as seeing flashing lights or spots. Common symptoms during a seizure include:  Abnormal sensations, such as an abnormal smell or a bitter taste in the mouth.   Sudden, general body stiffness.   Convulsions that involve rhythmic jerking of the face, arm, or leg on one or both sides.   Sudden change in consciousness.   Appearing to be awake but not responding.   Appearing to be asleep but cannot be awakened.   Grimacing, chewing, lip smacking, drooling, tongue biting, or loss of bowel or bladder control. After a seizure,  you may feel sleepy for a while. DIAGNOSIS  Your health care provider will ask about your symptoms and take a medical history. Descriptions from any witnesses to your seizures will be very helpful in the diagnosis. A physical exam, including a detailed neurological exam, is necessary. Various tests may be done, such as:   An electroencephalogram (EEG). This is a painless test of your brain waves. In this test, a diagram is created of your brain waves. These diagrams can be interpreted by a specialist.  An MRI of the brain.   A CT scan of the brain.   A spinal tap (lumbar puncture, LP).  Blood tests to check for signs of infection or abnormal blood chemistry. TREATMENT  There is no cure for epilepsy, but it is generally treatable. Once epilepsy is diagnosed, it is important to begin treatment as soon as possible. For most people with epilepsy, seizures can be controlled with medicines. The following may also be used:  A pacemaker for the brain (vagus nerve stimulator) can be used for people with seizures that are not well controlled by medicine.  Surgery on the brain. For some people, epilepsy eventually goes away. HOME CARE INSTRUCTIONS   Follow your health care provider's recommendations on driving and safety in normal activities.  Get enough rest. Lack of sleep can cause seizures.  Only take over-the-counter or prescription medicines as directed by your health care provider. Take any prescribed medicine exactly as directed.  Avoid any known triggers of your seizures.  Keep a seizure diary. Record what you recall about any seizure, especially any possible trigger.   Make   sure the people you live and work with know that you are prone to seizures. They should receive instructions on how to help you. In general, a witness to a seizure should:   Cushion your head and body.   Turn you on your side.   Avoid unnecessarily restraining you.   Not place anything inside your  mouth.   Call for emergency medical help if there is any question about what has occurred.   Follow up with your health care provider as directed. You may need regular blood tests to monitor the levels of your medicine.  SEEK MEDICAL CARE IF:   You develop signs of infection or other illness. This might increase the risk of a seizure.   You seem to be having more frequent seizures.   Your seizure pattern is changing.  SEEK IMMEDIATE MEDICAL CARE IF:   You have a seizure that does not stop after a few moments.   You have a seizure that causes any difficulty in breathing.   You have a seizure that results in a very severe headache.   You have a seizure that leaves you with the inability to speak or use a part of your body.  Document Released: 11/10/2005 Document Revised: 08/31/2013 Document Reviewed: 06/22/2013 [employer] Patient Information 2014 [employer].  

## 2014-04-17 ENCOUNTER — Other Ambulatory Visit: Payer: Self-pay | Admitting: Orthopedic Surgery

## 2014-04-17 NOTE — H&P (Signed)
Sheryl Hall DOB: 10/01/1961 Divorced / Language: Cleophus Molt / Race: White Female  Date of Admission:  04-19-2014  Chief Complaint:  Left Hip Pain  History of Present Illness The patient is a 53 year old female who comes in for a preoperative History and Physical. The patient is scheduled for a left total hip arthroplasty (anterior approach) to be performed by Dr. Dione Plover. Aluisio, MD at [facility] on 04-19-2014 . The patient is a 53 year old female who presents today for follow up of their hip. The patient is being followed for their left hip pain. Symptoms reported today include: pain, aching, popping and grinding. The patient feels that they are doing poorly and report their pain level to be mild to moderate. The following medication has been used for pain control: ibuprofen, 800mg  (the tramadol made her nauseated). Her pain is getting progressively worse. It is in her left groin, traveling into her left thigh. It is limiting what she can and cannot do. We obtained an MRI scan. 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 At this point now, the most predictable means of improving her pain and function is going to be total hip arthroplasty. She is ready top proceed with a total hip replacement. They have been treated conservatively in the past for the above stated problem and despite conservative measures, they continue to have progressive pain and severe functional limitations and dysfunction. They have failed non-operative management including home exercise, medications. It is felt that they would benefit from undergoing total joint replacement. Risks and benefits of the procedure have been discussed with the patient and they elect to proceed with surgery. There are no active contraindications to surgery such as ongoing infection or rapidly progressive neurological  disease.   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 Father. Hypertension. Father, Mother, Sister. Cancer. Father. Cerebrovascular Accident. Paternal Grandmother. Kidney disease. Paternal Grandfather. Heart disease in female family member before age 26 Heart disease in female family member before age 34 Osteoporosis. Mother. Heart Disease. Brother, Maternal Grandfather, Mother, Paternal Grandmother. Depression. Father. First Degree Relatives. reported Congestive Heart Failure. Maternal Grandmother, Paternal Grandmother.    Social History Exercise. Exercises never Children. 1 Former drinker. 01/05/2014: In the past drank beer only occasionally per week Living situation. live alone Current work status. disabled Tobacco use. Current every day smoker. 01/05/2014: smoke(d) 1/2 pack(s) per day Number of flights of stairs before winded. less than 1 Marital status. divorced Tobacco / smoke exposure. 01/05/2014: yes No history of drug/alcohol rehab Not under pain contract    Past Surgical History Spinal Fusion. neck Neck Disc Surgery Ear Surgery. Date: 16.   Review of Systems General:Not Present- Chills, Fever, Night Sweats, Fatigue, Weight Gain, Weight Loss and Memory Loss. Skin:Not Present- Hives, Itching, Rash, Eczema and Lesions. HEENT:Not Present- Tinnitus, Headache, Double Vision, Visual Loss, Hearing Loss and Dentures. Respiratory:Not Present- Shortness of breath with exertion, Shortness of breath at rest, Allergies, Coughing up blood and Chronic Cough. Cardiovascular:Not  Present- Chest Pain, Racing/skipping heartbeats, Difficulty Breathing Lying Down, Murmur, Swelling and Palpitations. Gastrointestinal:Not Present- Bloody Stool, Heartburn, Abdominal Pain, Vomiting, Nausea, Constipation, Diarrhea, Difficulty Swallowing, Jaundice  and Loss of appetitie. Female Genitourinary:Not Present- Blood in Urine, Urinary frequency, Weak urinary stream, Discharge, Flank Pain, Incontinence, Painful Urination, Urgency, Urinary Retention and Urinating at Night. Musculoskeletal:Not Present- Muscle Weakness, Muscle Pain, Joint Swelling, Joint Pain, Back Pain, Morning Stiffness and Spasms. Neurological:Not Present- Tremor, Dizziness, Blackout spells, Paralysis, Difficulty with balance and Weakness. Psychiatric:Not Present- Insomnia.    Vitals Weight: 156 lb Height: 66 in Weight was reported by patient. Height was reported by patient. Body Surface Area: 1.82 m Body Mass Index: 25.18 kg/m Pulse: 64 (Regular) Resp.: 14 (Unlabored) BP: 138/64 (Sitting, Right Arm, Standard)     Physical Exam The physical exam findings are as follows:   General Mental Status - Alert, cooperative and good historian. General Appearance- pleasant. Not in acute distress. Orientation- Oriented X3. Build & Nutrition- Well nourished and Well developed.   Head and Neck Head- normocephalic, atraumatic . Neck Global Assessment- supple. no bruit auscultated on the right and no bruit auscultated on the left.   Eye Pupil- Bilateral- Regular and Round. Motion- Bilateral- EOMI.   Chest and Lung Exam Auscultation: Breath sounds:- clear at anterior chest wall and - clear at posterior chest wall. Adventitious sounds:- No Adventitious sounds.   Cardiovascular Auscultation:Rhythm- Regular rate and rhythm. Heart Sounds- S1 WNL and S2 WNL. Murmurs & Other Heart Sounds:Auscultation of the heart reveals - No  Murmurs.   Abdomen Palpation/Percussion:Tenderness- Abdomen is non-tender to palpation. Rigidity (guarding)- Abdomen is soft. Auscultation:Auscultation of the abdomen reveals - Bowel sounds normal.   Female Genitourinary Not done, not pertinent to present illness   Musculoskeletal: Her right hip has normal range of motion. No discomfort. Left hip also has flexion to 100, rotation in 30, out 40, abduction 40 but with discomfort. There is no tenderness over the greater trochanter and the exam is normal. Pulse and sensation are both intact.  MRI: We reviewed her MRI scan. 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 She also ha some associated edema in the femoral neck. There is a tiny focus of sclerotic bone in the right femoral head, but there is no collapse.   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 Wynelle Link.  Plan is to go home versus inpatinet skilled rehab.  PCP - Dr. Jenna Luo  The patient will not receive TXA (tranexamic acid) due to: History of [place].  Signed electronically by Joelene Millin, III PA-C

## 2014-04-19 ENCOUNTER — Inpatient Hospital Stay (HOSPITAL_COMMUNITY): Payer: Medicaid Other

## 2014-04-19 ENCOUNTER — Inpatient Hospital Stay (HOSPITAL_COMMUNITY): Payer: Medicaid Other | Admitting: Anesthesiology

## 2014-04-19 ENCOUNTER — Encounter (HOSPITAL_COMMUNITY)
Admission: RE | Disposition: A | Discharge status: Home Health | Payer: Self-pay | Source: Ambulatory Visit | Attending: Orthopedic Surgery

## 2014-04-19 ENCOUNTER — Encounter (HOSPITAL_COMMUNITY): Payer: Medicaid Other | Admitting: Anesthesiology

## 2014-04-19 ENCOUNTER — Encounter (HOSPITAL_COMMUNITY): Payer: Self-pay | Admitting: *Deleted

## 2014-04-19 ENCOUNTER — Inpatient Hospital Stay (HOSPITAL_COMMUNITY)
Admission: RE | Admit: 2014-04-19 | Discharge: 2014-04-22 | DRG: 470 | Disposition: A | Discharge status: Home Health | Payer: Medicaid Other | Source: Ambulatory Visit | Attending: Orthopedic Surgery | Admitting: Orthopedic Surgery

## 2014-04-19 DIAGNOSIS — M169 Osteoarthritis of hip, unspecified: Secondary | ICD-10-CM

## 2014-04-19 DIAGNOSIS — Z6825 Body mass index (BMI) 25.0-25.9, adult: Secondary | ICD-10-CM

## 2014-04-19 DIAGNOSIS — Z96642 Presence of left artificial hip joint: Secondary | ICD-10-CM

## 2014-04-19 DIAGNOSIS — D62 Acute posthemorrhagic anemia: Secondary | ICD-10-CM

## 2014-04-19 DIAGNOSIS — M1610 Unilateral primary osteoarthritis, unspecified hip: Secondary | ICD-10-CM | POA: Diagnosis present

## 2014-04-19 DIAGNOSIS — K219 Gastro-esophageal reflux disease without esophagitis: Secondary | ICD-10-CM | POA: Diagnosis present

## 2014-04-19 DIAGNOSIS — M87059 Idiopathic aseptic necrosis of unspecified femur: Principal | ICD-10-CM | POA: Diagnosis present

## 2014-04-19 DIAGNOSIS — Z823 Family history of stroke: Secondary | ICD-10-CM

## 2014-04-19 DIAGNOSIS — Z981 Arthrodesis status: Secondary | ICD-10-CM

## 2014-04-19 DIAGNOSIS — M87052 Idiopathic aseptic necrosis of left femur: Secondary | ICD-10-CM | POA: Diagnosis present

## 2014-04-19 DIAGNOSIS — F17200 Nicotine dependence, unspecified, uncomplicated: Secondary | ICD-10-CM | POA: Diagnosis present

## 2014-04-19 DIAGNOSIS — Z8673 Personal history of transient ischemic attack (TIA), and cerebral infarction without residual deficits: Secondary | ICD-10-CM

## 2014-04-19 HISTORY — PX: TOTAL HIP ARTHROPLASTY: SHX124

## 2014-04-19 LAB — ABO/RH: ABO/RH(D): O POS

## 2014-04-19 LAB — TYPE AND SCREEN
ABO/RH(D): O POS
Antibody Screen: NEGATIVE

## 2014-04-19 SURGERY — ARTHROPLASTY, HIP, TOTAL, ANTERIOR APPROACH
Anesthesia: General | Site: Hip | Laterality: Left

## 2014-04-19 MED ORDER — KETOROLAC TROMETHAMINE 15 MG/ML IJ SOLN: 7.5000 mg | Freq: Four times a day (QID) | INTRAMUSCULAR | Status: AC | PRN

## 2014-04-19 MED ORDER — CEFAZOLIN SODIUM-DEXTROSE 2-3 GM-% IV SOLR
INTRAVENOUS | Status: AC
  Filled 2014-04-19: qty 50

## 2014-04-19 MED ORDER — ONDANSETRON HCL 4 MG/2ML IJ SOLN
INTRAMUSCULAR | Status: AC
  Filled 2014-04-19: qty 2

## 2014-04-19 MED ORDER — TRAMADOL HCL 50 MG PO TABS: 50.0000 mg | ORAL_TABLET | Freq: Four times a day (QID) | ORAL | Status: DC | PRN

## 2014-04-19 MED ORDER — LACTATED RINGERS IV SOLN: INTRAVENOUS | Status: DC

## 2014-04-19 MED ORDER — DEXAMETHASONE SODIUM PHOSPHATE 10 MG/ML IJ SOLN: 10.0000 mg | Freq: Once | INTRAMUSCULAR | Status: DC

## 2014-04-19 MED ORDER — HYDROMORPHONE HCL PF 1 MG/ML IJ SOLN
0.2500 mg | INTRAMUSCULAR | Status: DC | PRN
  Administered 2014-04-19 (×2): 0.25 mg via INTRAVENOUS

## 2014-04-19 MED ORDER — LIDOCAINE HCL (CARDIAC) 20 MG/ML IV SOLN
INTRAVENOUS | Status: AC
  Filled 2014-04-19: qty 5

## 2014-04-19 MED ORDER — HYDROMORPHONE HCL PF 1 MG/ML IJ SOLN
INTRAMUSCULAR | Status: DC | PRN
  Administered 2014-04-19: 1 mg via INTRAVENOUS

## 2014-04-19 MED ORDER — METOCLOPRAMIDE HCL 10 MG PO TABS: 5.0000 mg | ORAL_TABLET | Freq: Three times a day (TID) | ORAL | Status: DC | PRN

## 2014-04-19 MED ORDER — METHOCARBAMOL 500 MG PO TABS
500.0000 mg | ORAL_TABLET | Freq: Four times a day (QID) | ORAL | Status: DC | PRN
  Administered 2014-04-20 – 2014-04-21 (×2): 500 mg via ORAL
  Filled 2014-04-19 (×2): qty 1

## 2014-04-19 MED ORDER — BUDESONIDE-FORMOTEROL FUMARATE 160-4.5 MCG/ACT IN AERO
1.0000 | INHALATION_SPRAY | Freq: Two times a day (BID) | RESPIRATORY_TRACT | Status: DC
  Administered 2014-04-19 – 2014-04-22 (×5): 1 via RESPIRATORY_TRACT
  Filled 2014-04-19: qty 6

## 2014-04-19 MED ORDER — ACETAMINOPHEN 10 MG/ML IV SOLN
1000.0000 mg | Freq: Once | INTRAVENOUS | Status: DC
  Filled 2014-04-19: qty 100

## 2014-04-19 MED ORDER — ROCURONIUM BROMIDE 100 MG/10ML IV SOLN
INTRAVENOUS | Status: DC | PRN
  Administered 2014-04-19: 40 mg via INTRAVENOUS

## 2014-04-19 MED ORDER — FENTANYL CITRATE 0.05 MG/ML IJ SOLN
INTRAMUSCULAR | Status: AC
  Filled 2014-04-19: qty 5

## 2014-04-19 MED ORDER — MIDAZOLAM HCL 2 MG/2ML IJ SOLN
INTRAMUSCULAR | Status: AC
  Filled 2014-04-19: qty 2

## 2014-04-19 MED ORDER — MENTHOL 3 MG MT LOZG
1.0000 | LOZENGE | OROMUCOSAL | Status: DC | PRN
  Filled 2014-04-19: qty 9

## 2014-04-19 MED ORDER — ONDANSETRON HCL 4 MG/2ML IJ SOLN
INTRAMUSCULAR | Status: DC | PRN
  Administered 2014-04-19: 4 mg via INTRAVENOUS

## 2014-04-19 MED ORDER — DIPHENHYDRAMINE HCL 12.5 MG/5ML PO ELIX
12.5000 mg | ORAL_SOLUTION | ORAL | Status: DC | PRN
  Administered 2014-04-19: 12.5 mg via ORAL
  Filled 2014-04-19: qty 10

## 2014-04-19 MED ORDER — FLEET ENEMA 7-19 GM/118ML RE ENEM: 1.0000 | ENEMA | Freq: Once | RECTAL | Status: AC | PRN

## 2014-04-19 MED ORDER — SODIUM CHLORIDE 0.9 % IV SOLN: INTRAVENOUS | Status: DC

## 2014-04-19 MED ORDER — PROPOFOL 10 MG/ML IV BOLUS
INTRAVENOUS | Status: DC | PRN
Start: 2014-04-19 — End: 2014-04-19
  Administered 2014-04-19: 150 mg via INTRAVENOUS

## 2014-04-19 MED ORDER — ACETAMINOPHEN 650 MG RE SUPP: 650.0000 mg | Freq: Four times a day (QID) | RECTAL | Status: DC | PRN

## 2014-04-19 MED ORDER — ACETAMINOPHEN 500 MG PO TABS
1000.0000 mg | ORAL_TABLET | Freq: Four times a day (QID) | ORAL | Status: AC
  Administered 2014-04-19 – 2014-04-20 (×4): 1000 mg via ORAL
  Filled 2014-04-19 (×4): qty 2

## 2014-04-19 MED ORDER — METOCLOPRAMIDE HCL 5 MG/ML IJ SOLN: 5.0000 mg | Freq: Three times a day (TID) | INTRAMUSCULAR | Status: DC | PRN

## 2014-04-19 MED ORDER — FENTANYL CITRATE 0.05 MG/ML IJ SOLN
INTRAMUSCULAR | Status: DC | PRN
  Administered 2014-04-19: 25 ug via INTRAVENOUS
  Administered 2014-04-19 (×2): 50 ug via INTRAVENOUS
  Administered 2014-04-19: 25 ug via INTRAVENOUS
  Administered 2014-04-19 (×2): 50 ug via INTRAVENOUS

## 2014-04-19 MED ORDER — HYDROMORPHONE HCL PF 1 MG/ML IJ SOLN
INTRAMUSCULAR | Status: AC
  Filled 2014-04-19: qty 1

## 2014-04-19 MED ORDER — EPHEDRINE SULFATE 50 MG/ML IJ SOLN
INTRAMUSCULAR | Status: DC | PRN
  Administered 2014-04-19: 10 mg via INTRAVENOUS

## 2014-04-19 MED ORDER — ACETAMINOPHEN 10 MG/ML IV SOLN
INTRAVENOUS | Status: DC | PRN
  Administered 2014-04-19: 1000 mg via INTRAVENOUS

## 2014-04-19 MED ORDER — SODIUM CHLORIDE 0.9 % IJ SOLN
INTRAMUSCULAR | Status: AC
  Filled 2014-04-19: qty 50

## 2014-04-19 MED ORDER — CLONAZEPAM 1 MG PO TABS
1.0000 mg | ORAL_TABLET | ORAL | Status: DC
  Administered 2014-04-20 – 2014-04-22 (×5): 1 mg via ORAL
  Filled 2014-04-19 (×5): qty 1

## 2014-04-19 MED ORDER — DOCUSATE SODIUM 100 MG PO CAPS
100.0000 mg | ORAL_CAPSULE | Freq: Two times a day (BID) | ORAL | Status: DC
  Administered 2014-04-19 – 2014-04-22 (×6): 100 mg via ORAL

## 2014-04-19 MED ORDER — LEVETIRACETAM 250 MG PO TABS
500.0000 mg | ORAL_TABLET | Freq: Every day | ORAL | Status: DC
  Administered 2014-04-19 – 2014-04-21 (×3): 500 mg via ORAL
  Filled 2014-04-19 (×4): qty 2

## 2014-04-19 MED ORDER — ACETAMINOPHEN 325 MG PO TABS: 650.0000 mg | ORAL_TABLET | Freq: Four times a day (QID) | ORAL | Status: DC | PRN

## 2014-04-19 MED ORDER — PHENOL 1.4 % MT LIQD
1.0000 | OROMUCOSAL | Status: DC | PRN
  Filled 2014-04-19: qty 177

## 2014-04-19 MED ORDER — DEXAMETHASONE SODIUM PHOSPHATE 10 MG/ML IJ SOLN
INTRAMUSCULAR | Status: AC
  Filled 2014-04-19: qty 1

## 2014-04-19 MED ORDER — TRAZODONE HCL 100 MG PO TABS
100.0000 mg | ORAL_TABLET | Freq: Every day | ORAL | Status: DC
  Administered 2014-04-19 – 2014-04-21 (×3): 100 mg via ORAL
  Filled 2014-04-19: qty 1
  Filled 2014-04-19: qty 2
  Filled 2014-04-19 (×3): qty 1

## 2014-04-19 MED ORDER — DEXAMETHASONE 6 MG PO TABS
10.0000 mg | ORAL_TABLET | Freq: Every day | ORAL | Status: AC
  Filled 2014-04-19: qty 1

## 2014-04-19 MED ORDER — DEXTROSE-NACL 5-0.9 % IV SOLN
INTRAVENOUS | Status: DC
  Administered 2014-04-19: 13:00:00 via INTRAVENOUS

## 2014-04-19 MED ORDER — POLYETHYLENE GLYCOL 3350 17 G PO PACK
17.0000 g | PACK | Freq: Every day | ORAL | Status: DC | PRN
  Administered 2014-04-21: 17 g via ORAL

## 2014-04-19 MED ORDER — BISACODYL 10 MG RE SUPP: 10.0000 mg | Freq: Every day | RECTAL | Status: DC | PRN

## 2014-04-19 MED ORDER — LEVOTHYROXINE SODIUM 50 MCG PO TABS
50.0000 ug | ORAL_TABLET | Freq: Every day | ORAL | Status: DC
  Administered 2014-04-20 – 2014-04-22 (×3): 50 ug via ORAL
  Filled 2014-04-19 (×4): qty 1

## 2014-04-19 MED ORDER — RIVAROXABAN 10 MG PO TABS
10.0000 mg | ORAL_TABLET | Freq: Every day | ORAL | Status: DC
  Administered 2014-04-20 – 2014-04-22 (×3): 10 mg via ORAL
  Filled 2014-04-19 (×4): qty 1

## 2014-04-19 MED ORDER — OXYCODONE HCL 5 MG PO TABS
5.0000 mg | ORAL_TABLET | ORAL | Status: DC | PRN
  Administered 2014-04-19 – 2014-04-20 (×5): 10 mg via ORAL
  Administered 2014-04-20: 5 mg via ORAL
  Administered 2014-04-21 (×5): 10 mg via ORAL
  Administered 2014-04-22: 5 mg via ORAL
  Administered 2014-04-22: 10 mg via ORAL
  Filled 2014-04-19 (×5): qty 2
  Filled 2014-04-19: qty 1
  Filled 2014-04-19 (×5): qty 2
  Filled 2014-04-19: qty 1
  Filled 2014-04-19: qty 2

## 2014-04-19 MED ORDER — LIDOCAINE HCL (CARDIAC) 20 MG/ML IV SOLN
INTRAVENOUS | Status: DC | PRN
  Administered 2014-04-19: 80 mg via INTRAVENOUS

## 2014-04-19 MED ORDER — DEXAMETHASONE SODIUM PHOSPHATE 10 MG/ML IJ SOLN
10.0000 mg | Freq: Every day | INTRAMUSCULAR | Status: AC
  Administered 2014-04-20: 10 mg via INTRAVENOUS
  Filled 2014-04-19: qty 1

## 2014-04-19 MED ORDER — ONDANSETRON HCL 4 MG/2ML IJ SOLN
4.0000 mg | Freq: Four times a day (QID) | INTRAMUSCULAR | Status: DC | PRN
  Administered 2014-04-19 – 2014-04-20 (×2): 4 mg via INTRAVENOUS
  Filled 2014-04-19 (×2): qty 2

## 2014-04-19 MED ORDER — LEVETIRACETAM 250 MG PO TABS
250.0000 mg | ORAL_TABLET | Freq: Every day | ORAL | Status: DC
  Administered 2014-04-21 – 2014-04-22 (×2): 250 mg via ORAL
  Filled 2014-04-19 (×3): qty 1

## 2014-04-19 MED ORDER — DEXAMETHASONE SODIUM PHOSPHATE 10 MG/ML IJ SOLN
INTRAMUSCULAR | Status: DC | PRN
  Administered 2014-04-19: 10 mg via INTRAVENOUS

## 2014-04-19 MED ORDER — BUPIVACAINE LIPOSOME 1.3 % IJ SUSP
20.0000 mL | Freq: Once | INTRAMUSCULAR | Status: DC
  Filled 2014-04-19: qty 20

## 2014-04-19 MED ORDER — PROPOFOL 10 MG/ML IV BOLUS
INTRAVENOUS | Status: AC
  Filled 2014-04-19: qty 20

## 2014-04-19 MED ORDER — HYDROMORPHONE HCL PF 2 MG/ML IJ SOLN
INTRAMUSCULAR | Status: AC
  Filled 2014-04-19: qty 1

## 2014-04-19 MED ORDER — MIDAZOLAM HCL 5 MG/5ML IJ SOLN
INTRAMUSCULAR | Status: DC | PRN
  Administered 2014-04-19: 2 mg via INTRAVENOUS

## 2014-04-19 MED ORDER — MORPHINE SULFATE 2 MG/ML IJ SOLN
1.0000 mg | INTRAMUSCULAR | Status: DC | PRN
  Administered 2014-04-19: 2 mg via INTRAVENOUS
  Filled 2014-04-19: qty 1

## 2014-04-19 MED ORDER — LAMOTRIGINE 25 MG PO TABS
225.0000 mg | ORAL_TABLET | Freq: Every morning | ORAL | Status: DC
  Administered 2014-04-21 – 2014-04-22 (×2): 225 mg via ORAL
  Filled 2014-04-19 (×3): qty 1

## 2014-04-19 MED ORDER — PANTOPRAZOLE SODIUM 40 MG PO TBEC
80.0000 mg | DELAYED_RELEASE_TABLET | Freq: Every day | ORAL | Status: DC
  Administered 2014-04-19: 80 mg via ORAL
  Filled 2014-04-19 (×2): qty 2

## 2014-04-19 MED ORDER — NEOSTIGMINE METHYLSULFATE 10 MG/10ML IV SOLN
INTRAVENOUS | Status: DC | PRN
  Administered 2014-04-19: 3 mg via INTRAVENOUS

## 2014-04-19 MED ORDER — BUPIVACAINE LIPOSOME 1.3 % IJ SUSP
INTRAMUSCULAR | Status: DC | PRN
  Administered 2014-04-19: 20 mL

## 2014-04-19 MED ORDER — LACTATED RINGERS IV SOLN
INTRAVENOUS | Status: DC
  Administered 2014-04-19: 11:00:00 via INTRAVENOUS
  Administered 2014-04-19: 1000 mL via INTRAVENOUS

## 2014-04-19 MED ORDER — CLONAZEPAM 0.5 MG PO TABS
0.5000 mg | ORAL_TABLET | Freq: Every day | ORAL | Status: DC
  Administered 2014-04-19 – 2014-04-21 (×3): 0.5 mg via ORAL
  Filled 2014-04-19 (×3): qty 1

## 2014-04-19 MED ORDER — CEFAZOLIN SODIUM-DEXTROSE 2-3 GM-% IV SOLR
INTRAVENOUS | Status: DC | PRN
  Administered 2014-04-19: 2 g via INTRAVENOUS

## 2014-04-19 MED ORDER — CEFAZOLIN SODIUM-DEXTROSE 2-3 GM-% IV SOLR: 2.0000 g | INTRAVENOUS | Status: DC

## 2014-04-19 MED ORDER — SUCCINYLCHOLINE CHLORIDE 20 MG/ML IJ SOLN
INTRAMUSCULAR | Status: DC | PRN
  Administered 2014-04-19: 80 mg via INTRAVENOUS

## 2014-04-19 MED ORDER — ONDANSETRON HCL 4 MG PO TABS
4.0000 mg | ORAL_TABLET | Freq: Four times a day (QID) | ORAL | Status: DC | PRN
  Administered 2014-04-21 – 2014-04-22 (×2): 4 mg via ORAL
  Filled 2014-04-19 (×3): qty 1

## 2014-04-19 MED ORDER — GLYCOPYRROLATE 0.2 MG/ML IJ SOLN
INTRAMUSCULAR | Status: DC | PRN
  Administered 2014-04-19: .6 mg via INTRAVENOUS

## 2014-04-19 MED ORDER — BUPIVACAINE HCL (PF) 0.25 % IJ SOLN
INTRAMUSCULAR | Status: AC
  Filled 2014-04-19: qty 30

## 2014-04-19 MED ORDER — BUPIVACAINE HCL (PF) 0.25 % IJ SOLN
INTRAMUSCULAR | Status: DC | PRN
  Administered 2014-04-19: 20 mL

## 2014-04-19 MED ORDER — SODIUM CHLORIDE 0.9 % IJ SOLN
INTRAMUSCULAR | Status: DC | PRN
  Administered 2014-04-19: 30 mL

## 2014-04-19 MED ORDER — METHOCARBAMOL 1000 MG/10ML IJ SOLN
500.0000 mg | Freq: Four times a day (QID) | INTRAVENOUS | Status: DC | PRN
  Administered 2014-04-19: 500 mg via INTRAVENOUS
  Filled 2014-04-19: qty 5

## 2014-04-19 MED ORDER — CEFAZOLIN SODIUM 1-5 GM-% IV SOLN
1.0000 g | Freq: Four times a day (QID) | INTRAVENOUS | Status: AC
  Administered 2014-04-19 (×2): 1 g via INTRAVENOUS
  Filled 2014-04-19 (×2): qty 50

## 2014-04-19 MED ORDER — LAMOTRIGINE 25 MG PO TABS: 50.0000 mg | ORAL_TABLET | Freq: Every morning | ORAL | Status: DC

## 2014-04-19 SURGICAL SUPPLY — 44 items
BAG SPEC THK2 15X12 ZIP CLS (MISCELLANEOUS)
BAG ZIPLOCK 12X15 (MISCELLANEOUS) IMPLANT
BLADE EXTENDED COATED 6.5IN (ELECTRODE) ×3 IMPLANT
BLADE SAW SGTL 18X1.27X75 (BLADE) ×2 IMPLANT
BLADE SAW SGTL 18X1.27X75MM (BLADE) ×1
CAPT HIP PF COP ×2 IMPLANT
CLOSURE WOUND 1/2 X4 (GAUZE/BANDAGES/DRESSINGS) ×1
COVER PERINEAL POST (MISCELLANEOUS) ×3 IMPLANT
DECANTER SPIKE VIAL GLASS SM (MISCELLANEOUS) ×3 IMPLANT
DRAPE C-ARM 42X120 X-RAY (DRAPES) ×3 IMPLANT
DRAPE STERI IOBAN 125X83 (DRAPES) ×3 IMPLANT
DRAPE U-SHAPE 47X51 STRL (DRAPES) ×9 IMPLANT
DRSG ADAPTIC 3X8 NADH LF (GAUZE/BANDAGES/DRESSINGS) ×3 IMPLANT
DRSG MEPILEX BORDER 4X4 (GAUZE/BANDAGES/DRESSINGS) ×3 IMPLANT
DRSG MEPILEX BORDER 4X8 (GAUZE/BANDAGES/DRESSINGS) ×3 IMPLANT
DURAPREP 26ML APPLICATOR (WOUND CARE) ×3 IMPLANT
ELECT REM PT RETURN 9FT ADLT (ELECTROSURGICAL) ×3
ELECTRODE REM PT RTRN 9FT ADLT (ELECTROSURGICAL) ×1 IMPLANT
EVACUATOR 1/8 PVC DRAIN (DRAIN) ×3 IMPLANT
FACESHIELD WRAPAROUND (MASK) ×12 IMPLANT
FACESHIELD WRAPAROUND OR TEAM (MASK) ×4 IMPLANT
GLOVE BIO SURGEON STRL SZ7.5 (GLOVE) ×3 IMPLANT
GLOVE BIO SURGEON STRL SZ8 (GLOVE) ×6 IMPLANT
GLOVE BIOGEL PI IND STRL 8 (GLOVE) ×2 IMPLANT
GLOVE BIOGEL PI INDICATOR 8 (GLOVE) ×4
GOWN STRL REUS W/TWL LRG LVL3 (GOWN DISPOSABLE) ×3 IMPLANT
GOWN STRL REUS W/TWL XL LVL3 (GOWN DISPOSABLE) ×3 IMPLANT
KIT BASIN OR (CUSTOM PROCEDURE TRAY) ×3 IMPLANT
NDL SAFETY ECLIPSE 18X1.5 (NEEDLE) ×2 IMPLANT
NEEDLE HYPO 18GX1.5 SHARP (NEEDLE) ×6
PACK TOTAL JOINT (CUSTOM PROCEDURE TRAY) ×3 IMPLANT
SPONGE GAUZE 4X4 12PLY (GAUZE/BANDAGES/DRESSINGS) ×2 IMPLANT
STRIP CLOSURE SKIN 1/2X4 (GAUZE/BANDAGES/DRESSINGS) ×2 IMPLANT
SUCTION FRAZIER 12FR DISP (SUCTIONS) IMPLANT
SUT ETHIBOND NAB CT1 #1 30IN (SUTURE) ×3 IMPLANT
SUT MNCRL AB 4-0 PS2 18 (SUTURE) ×3 IMPLANT
SUT VIC AB 2-0 CT1 27 (SUTURE) ×6
SUT VIC AB 2-0 CT1 TAPERPNT 27 (SUTURE) ×2 IMPLANT
SUT VLOC 180 0 24IN GS25 (SUTURE) ×3 IMPLANT
SYR 20CC LL (SYRINGE) ×3 IMPLANT
SYR 50ML LL SCALE MARK (SYRINGE) ×3 IMPLANT
TAPE STRIPS DRAPE STRL (GAUZE/BANDAGES/DRESSINGS) ×2 IMPLANT
TOWEL OR 17X26 10 PK STRL BLUE (TOWEL DISPOSABLE) ×3 IMPLANT
TRAY FOLEY CATH 14FRSI W/METER (CATHETERS) ×3 IMPLANT

## 2014-04-19 NOTE — Progress Notes (Signed)
Utilization review completed.  

## 2014-04-19 NOTE — Transfer of Care (Signed)
Immediate Anesthesia Transfer of Care Note  Patient: Sheryl Hall  Procedure(s) Performed: Procedure(s): LEFT TOTAL HIP ARTHROPLASTY ANTERIOR APPROACH (Left)  Patient Location: PACU  Anesthesia Type:General  Level of Consciousness: sedated  Airway & Oxygen Therapy: Patient Spontanous Breathing and Patient connected to face mask oxygen  Post-op Assessment: Report given to PACU RN and Post -op Vital signs reviewed and stable  Post vital signs: Reviewed and stable  Complications: No apparent anesthesia complications

## 2014-04-19 NOTE — Progress Notes (Signed)
Clinical Social Work Department BRIEF PSYCHOSOCIAL ASSESSMENT 04/19/2014  Patient:  Sheryl Hall, Sheryl Hall     Account Number:  [id number]     Admit date:  04/19/2014  Clinical Social Worker:  Lacie Scotts  Date/Time:  04/19/2014 03:27 PM  Referred by:  Physician  Date Referred:  04/19/2014 Referred for  SNF Placement   Other Referral:   Interview type:  Patient Other interview type:    PSYCHOSOCIAL DATA Living Status:  ALONE Admitted from facility:   Level of care:   Primary support name:  Verlin Dike Primary support relationship to patient:  SIBLING Degree of support available:   supportive    CURRENT CONCERNS Current Concerns  Post-Acute Placement   Other Concerns:    SOCIAL WORK ASSESSMENT / PLAN Pt is a 53 yr old female living at home prior to hospitalization. CSW has met with pt to assist with d/c planning. This is a planned admission. ST Rehab will be needed following hospital d/c. Pt was hoping [facility] would have an opening for her. SNF has been contacted and does not have a medicaid bed at this time. SNF search in [place] / [place] has been initiated and bed offers will be provided as received.   Assessment/plan status:  Psychosocial Support/Ongoing Assessment of Needs Other assessment/ plan:   Information/referral to community resources:   Insurance coverage for SNF and ambulance transport placement reviewed. Pt is aware SNF will request pt stay in placement for 30 days so they will get paid by medicaid.    PATIENT'S/FAMILY'S RESPONSE TO PLAN OF CARE: Pt is disappointed that [facility] unable to assist since [facility]. She is willing to consider other facilities for rehab. CSW will meet with pt again THURS to review bed offers and answer additional questions related to placement.   Werner Lean LCSW [phone]

## 2014-04-19 NOTE — Anesthesia Preprocedure Evaluation (Signed)
Anesthesia Evaluation  Patient identified by MRN, date of birth, ID band Patient awake    Reviewed: Allergy & Precautions, H&P , NPO status , Patient's Chart, lab work & pertinent test results  Airway Mallampati: II  TM Distance: >3 FB Neck ROM: full    Dental no notable dental hx. (+) Teeth Intact, Dental Advisory Given   Pulmonary neg pulmonary ROS, Current Smoker,  breath sounds clear to auscultation  Pulmonary exam normal       Cardiovascular Exercise Tolerance: Good negative cardio ROS  Rhythm:regular Rate:Normal     Neuro/Psych  Headaches, Seizures -,  Anxiety Depression Bipolar Disorder 2014 right parietal stroke. 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, Residual Symptoms negative psych ROS   GI/Hepatic negative GI ROS, Neg liver ROS, GERD-  Medicated and Controlled,  Endo/Other  Hyperthyroidism   Renal/GU negative Renal ROS  negative genitourinary   Musculoskeletal   Abdominal   Peds  Hematology negative hematology ROS (+)   Anesthesia Other Findings   Reproductive/Obstetrics negative OB ROS                             Anesthesia Physical  Anesthesia Plan  ASA: II  Anesthesia Plan: General   Post-op Pain Management:    Induction: Intravenous  Airway Management Planned: Oral ETT  Additional Equipment:   Intra-op Plan:   Post-operative Plan: Extubation in OR  Informed Consent: I have reviewed the patients History and Physical, chart, labs and discussed the procedure including the risks, benefits and alternatives for the proposed anesthesia with the patient or authorized representative who has indicated his/her understanding and acceptance.   Dental Advisory Given  Plan Discussed with: CRNA and Surgeon  Anesthesia Plan Comments:         Anesthesia Quick Evaluation  

## 2014-04-19 NOTE — Progress Notes (Addendum)
Clinical Social Work Department CLINICAL SOCIAL WORK PLACEMENT NOTE 04/19/2014  Patient: Sheryl Hall, Sheryl Hall Account Number: Admit date:  04/19/2014  Clinical Social Worker:  Werner Lean, LCSW  Date/time:  04/19/2014 03:39 PM  Clinical Social Work is seeking post-discharge placement for this patient at the following level of care:   SKILLED NURSING   (*CSW will update this form in Epic as items are completed)     Patient/family provided with [facility] Department of Clinical Social Work's list of facilities offering this level of care within the geographic area requested by the patient (or if unable, by the patient's family).  04/19/2014  Patient/family informed of their freedom to choose among providers that offer the needed level of care, that participate in Medicare, Medicaid or managed care program needed by the patient, have an available bed and are willing to accept the patient.  04/19/2014  Patient/family informed of [facility]' ownership interest in [facility], as well as of the fact that they are under no obligation to receive care at this facility.  PASARR submitted to EDS on  PASARR number received from [facility] on   FL2 transmitted to all facilities in geographic area requested by pt/family on  04/19/2014 FL2 transmitted to all facilities within larger geographic area on   Patient informed that his/her managed care company has contracts with or will negotiate with  certain facilities, including the following:     Patient/family informed of bed offers received:   Patient chooses bed at  Physician recommends and patient chooses bed at    Patient to be transferred to  on   Patient to be transferred to facility by   The following physician request were entered in Epic:   Additional Comments:  Werner Lean LCSW [phone]

## 2014-04-19 NOTE — Interval H&P Note (Signed)
History and Physical Interval Note:  04/19/2014 8:40 AM  Sheryl Hall  has presented today for surgery, with the diagnosis of avascular necrosis of the left hip  The various methods of treatment have been discussed with the patient and family. After consideration of risks, benefits and other options for treatment, the patient has consented to  Procedure(s): LEFT TOTAL HIP ARTHROPLASTY ANTERIOR APPROACH (Left) as a surgical intervention .  The patient's history has been reviewed, patient examined, no change in status, stable for surgery.  I have reviewed the patient's chart and labs.  Questions were answered to the patient's satisfaction.     Pilar Plate Tehilla Coffel V

## 2014-04-19 NOTE — Evaluation (Signed)
Physical Therapy Evaluation Patient Details Name: Sheryl Hall MRN: [id number] DOB: 1961/09/27 Today's Date: 04/19/2014   History of Present Illness     Clinical Impression  Pt s/p L THR presents with decreased L LE strength/ROM and post op pain limiting functional mobility.  Pt would benefit from follow up rehab at SNF level to maximize IND and safety prior to return home with limited assist.    Follow Up Recommendations Home health PT;SNF    Equipment Recommendations  Rolling walker with 5" wheels    Recommendations for Other Services OT consult     Precautions / Restrictions Precautions Precautions: Fall Precaution Comments: Pt impulsive and c/o dizziness with OOB activity Restrictions Weight Bearing Restrictions: No Other Position/Activity Restrictions: WBAT      Mobility  Bed Mobility Overal bed mobility: Needs Assistance Bed Mobility: Supine to Sit     Supine to sit: Min assist     General bed mobility comments: cues for sequence and use of R LE to self assist  Transfers Overall transfer level: Needs assistance Equipment used: Rolling walker (2 wheeled) Transfers: Sit to/from Stand Sit to Stand: Min assist;Mod assist;+2 physical assistance         General transfer comment: cues for LE management and use of UEs to self assist  Ambulation/Gait Ambulation/Gait assistance: Min assist;Mod assist;+2 physical assistance Ambulation Distance (Feet): 7 Feet Assistive device: Rolling walker (2 wheeled) Gait Pattern/deviations: Step-to pattern;Decreased step length - right;Decreased step length - left;Shuffle;Trunk flexed     General Gait Details: cues for sequence, posture, and position from RW - ltd by increasing dizziness  Stairs            Wheelchair Mobility    Modified Rankin (Stroke Patients Only)       Balance                                             Pertinent Vitals/Pain 5/10; premed, ice pack provided    Home  Living Family/patient expects to be discharged to:: Private residence Living Arrangements: Alone Available Help at Discharge: Family Type of Home: Mobile home Home Access: Ramped entrance;Stairs to enter Entrance Stairs-Rails: Right Entrance Stairs-Number of Steps: 3 Home Layout: One level Home Equipment: None Additional Comments: Pt sister is investigating time off to assist at home as alternative to SNF     Prior Function Level of Independence: Independent               Hand Dominance        Extremity/Trunk Assessment   Upper Extremity Assessment: Overall WFL for tasks assessed           Lower Extremity Assessment: LLE deficits/detail      Cervical / Trunk Assessment: Normal  Communication   Communication: No difficulties  Cognition Arousal/Alertness: Awake/alert Behavior During Therapy: WFL for tasks assessed/performed Overall Cognitive Status: Within Functional Limits for tasks assessed                      General Comments      Exercises        Assessment/Plan    PT Assessment Patient needs continued PT services  PT Diagnosis Difficulty walking   PT Problem List Decreased strength;Decreased range of motion;Decreased activity tolerance;Decreased mobility;Decreased knowledge of use of DME;Pain;Decreased safety awareness  PT Treatment Interventions DME instruction;Gait training;Stair training;Functional mobility training;Therapeutic activities;Therapeutic exercise;Patient/family  education   PT Goals (Current goals can be found in the Care Plan section) Acute Rehab PT Goals Patient Stated Goal: Resume previous lifestyle with decreased pain PT Goal Formulation: With patient Time For Goal Achievement: 04/26/14 Potential to Achieve Goals: Good    Frequency 7X/week   Barriers to discharge Decreased caregiver support Pt is hoping for appropriate SNF placement.  Her sister is investigating possible time off from work to assist as alternative     Co-evaluation               End of Session Equipment Utilized During Treatment: Gait belt Activity Tolerance: Other (comment) (dizziness with OOB activity) Patient left: in chair;with call bell/phone within reach;with family/visitor present Nurse Communication: Mobility status         Time: 1710-1731 PT Time Calculation (min): 21 min   Charges:   PT Evaluation $Initial PT Evaluation Tier I: 1 Procedure PT Treatments $Gait Training: 8-22 mins   PT G Codes:          Mathis Fare 04/19/2014, 6:11 PM

## 2014-04-19 NOTE — Op Note (Signed)
OPERATIVE REPORT  PREOPERATIVE DIAGNOSIS: Avascular necrosis of the Left hip.   POSTOPERATIVE DIAGNOSIS: Avascular necrosis of the Left  hip.   PROCEDURE: Left total hip arthroplasty, anterior approach.   SURGEON: Gaynelle Arabian, MD   ASSISTANT: Arlee Muslim, PA-C  ANESTHESIA:  General  ESTIMATED BLOOD LOSS:- 150 ml   DRAINS: Hemovac x1.   COMPLICATIONS: None   CONDITION: PACU - hemodynamically stable.   BRIEF CLINICAL NOTE: Sheryl Hall is a 53 y.o. 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   PROCEDURE IN DETAIL: After successful administration of spinal  anesthetic, the traction boots for the [facility] bed were placed on both  feet and the patient was placed onto the [facility] bed, boots placed into the leg  holders. The Left hip was then isolated from the perineum with plastic  drapes and prepped and draped in the usual sterile fashion. ASIS and  greater trochanter were marked and a oblique incision was made, starting  at about 1 cm lateral and 2 cm distal to the ASIS and coursing towards  the anterior cortex of the femur. The skin was cut with a 10 blade  through subcutaneous tissue to the level of the fascia overlying the  tensor fascia lata muscle. The fascia was then incised in line with the  incision at the junction of the anterior third and posterior 2/3rd. The  muscle was teased off the fascia and then the interval between the TFL  and the rectus was developed. The Hohmann retractor was then placed at  the top of the femoral neck over the capsule. The vessels overlying the  capsule were cauterized and the fat on top of the capsule was removed.  A Hohmann retractor was then placed anterior underneath the rectus  femoris to give exposure to the entire anterior capsule. A T-shaped  capsulotomy was  performed. The edges were tagged and the femoral head  was identified.       Osteophytes are removed off the superior acetabulum.  The femoral neck was then cut in situ with an oscillating saw. Traction  was then applied to the left lower extremity utilizing the [facility]  traction. The femoral head was then removed. Retractors were placed  around the acetabulum and then circumferential removal of the labrum was  performed. Osteophytes were also removed. Reaming starts at 45 mm to  medialize and  Increased in 2 mm increments to 49 mm. We reamed in  approximately 40 degrees of abduction, 20 degrees anteversion. A 50 mm  pinnacle acetabular shell was then impacted in anatomic position under  fluoroscopic guidance with excellent purchase. We did not need to place  any additional dome screws. A 32 mm neutral + 4 marathon liner was then  placed into the acetabular shell.       The femoral lift was then placed along the lateral aspect of the femur  just distal to the vastus ridge. The leg was  externally rotated and capsule  was stripped off the inferior aspect of the femoral neck down to the  level of the lesser trochanter, this was done with electrocautery. The femur was lifted after this was performed. The  leg was then placed and extended in adducted position to essentially delivering the femur. We also removed the capsule superiorly and the  piriformis  from the piriformis fossa to gain excellent exposure of the  proximal femur. Rongeur was used to remove some cancellous bone to get  into the lateral portion of the proximal femur for placement of the  initial starter reamer. The starter broaches was placed  the starter broach  and was shown to go down the center of the canal. Broaching  with the  Corail system was then performed starting at size 8, coursing  Up to size 11. A size 11 had excellent torsional and rotational  and axial stability. The trial standard offset neck was then placed  with a  32 + 1 trial head. The hip was then reduced. We confirmed that  the stem was in the canal both on AP and lateral x-rays. It also has excellent sizing. The hip was reduced with outstanding stability through full extension, full external rotation,  and then flexion in adduction internal rotation. AP pelvis was taken  and the leg lengths were measured and found to be exactly equal. Hip  was then dislocated again and the femoral head and neck removed. The  femoral broach was removed. Size 11 Corail stem with a standard offset  neck was then impacted into the femur following native anteversion. Has  excellent purchase in the canal. Excellent torsional and rotational and  axial stability. It is confirmed to be in the canal on AP and lateral  fluoroscopic views. The 32 + 1 ceramic head was placed and the hip  reduced with outstanding stability. Again AP pelvis was taken and it  confirmed that the leg lengths were equal. The wound was then copiously  irrigated with saline solution and the capsule reattached and repaired  with Ethibond suture.  20 mL of Exparel mixed with 50 mL of saline then additional 20 ml of .25% Bupivicaine injected into the capsule and into the edge of the tensor fascia lata as well as subcutaneous tissue. The fascia overlying the tensor fascia lata was  then closed with a running #1 V-Loc. Subcu was closed with interrupted  2-0 Vicryl and subcuticular running 4-0 Monocryl. Incision was cleaned  and dried. Steri-Strips and a bulky sterile dressing applied. Hemovac  drain was hooked to suction and then he was awakened and transported to  recovery in stable condition.        Please note that a surgical assistant was a medical necessity for this procedure to perform it in a safe and expeditious manner. Assistant was necessary to provide appropriate retraction of vital neurovascular structures and to prevent femoral fracture and allow for anatomic placement of the prosthesis.  Gaynelle Arabian, M.D.

## 2014-04-19 NOTE — H&P (View-Only) (Signed)
Sheryl Hall DOB: 13-Jan-1961 Divorced / Language: Cleophus Molt / Race: White Female  Date of Admission:  04-19-2014  Chief Complaint:  Left Hip Pain  History of Present Illness The patient is a 53 year old female who comes in for a preoperative History and Physical. The patient is scheduled for a left total hip arthroplasty (anterior approach) to be performed by Dr. Dione Plover. Aluisio, MD at [facility] on 04-19-2014 . The patient is a 53 year old female who presents today for follow up of their hip. The patient is being followed for their left hip pain. Symptoms reported today include: pain, aching, popping and grinding. The patient feels that they are doing poorly and report their pain level to be mild to moderate. The following medication has been used for pain control: ibuprofen, 800mg  (the tramadol made her nauseated). Her pain is getting progressively worse. It is in her left groin, traveling into her left thigh. It is limiting what she can and cannot do. We obtained an MRI scan. 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 At this point now, the most predictable means of improving her pain and function is going to be total hip arthroplasty. She is ready top proceed with a total hip replacement. They have been treated conservatively in the past for the above stated problem and despite conservative measures, they continue to have progressive pain and severe functional limitations and dysfunction. They have failed non-operative management including home exercise, medications. It is felt that they would benefit from undergoing total joint replacement. Risks and benefits of the procedure have been discussed with the patient and they elect to proceed with surgery. There are no active contraindications to surgery such as ongoing infection or rapidly progressive neurological  disease.   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 Father. Hypertension. Father, Mother, Sister. Cancer. Father. Cerebrovascular Accident. Paternal Grandmother. Kidney disease. Paternal Grandfather. Heart disease in female family member before age 57 Heart disease in female family member before age 50 Osteoporosis. Mother. Heart Disease. Brother, Maternal Grandfather, Mother, Paternal Grandmother. Depression. Father. First Degree Relatives. reported Congestive Heart Failure. Maternal Grandmother, Paternal Grandmother.    Social History Exercise. Exercises never Children. 1 Former drinker. 01/05/2014: In the past drank beer only occasionally per week Living situation. live alone Current work status. disabled Tobacco use. Current every day smoker. 01/05/2014: smoke(d) 1/2 pack(s) per day Number of flights of stairs before winded. less than 1 Marital status. divorced Tobacco / smoke exposure. 01/05/2014: yes No history of drug/alcohol rehab Not under pain contract    Past Surgical History Spinal Fusion. neck Neck Disc Surgery Ear Surgery. Date: 2.   Review of Systems General:Not Present- Chills, Fever, Night Sweats, Fatigue, Weight Gain, Weight Loss and Memory Loss. Skin:Not Present- Hives, Itching, Rash, Eczema and Lesions. HEENT:Not Present- Tinnitus, Headache, Double Vision, Visual Loss, Hearing Loss and Dentures. Respiratory:Not Present- Shortness of breath with exertion, Shortness of breath at rest, Allergies, Coughing up blood and Chronic Cough. Cardiovascular:Not  Present- Chest Pain, Racing/skipping heartbeats, Difficulty Breathing Lying Down, Murmur, Swelling and Palpitations. Gastrointestinal:Not Present- Bloody Stool, Heartburn, Abdominal Pain, Vomiting, Nausea, Constipation, Diarrhea, Difficulty Swallowing, Jaundice  and Loss of appetitie. Female Genitourinary:Not Present- Blood in Urine, Urinary frequency, Weak urinary stream, Discharge, Flank Pain, Incontinence, Painful Urination, Urgency, Urinary Retention and Urinating at Night. Musculoskeletal:Not Present- Muscle Weakness, Muscle Pain, Joint Swelling, Joint Pain, Back Pain, Morning Stiffness and Spasms. Neurological:Not Present- Tremor, Dizziness, Blackout spells, Paralysis, Difficulty with balance and Weakness. Psychiatric:Not Present- Insomnia.    Vitals Weight: 156 lb Height: 66 in Weight was reported by patient. Height was reported by patient. Body Surface Area: 1.82 m Body Mass Index: 25.18 kg/m Pulse: 64 (Regular) Resp.: 14 (Unlabored) BP: 138/64 (Sitting, Right Arm, Standard)     Physical Exam The physical exam findings are as follows:   General Mental Status - Alert, cooperative and good historian. General Appearance- pleasant. Not in acute distress. Orientation- Oriented X3. Build & Nutrition- Well nourished and Well developed.   Head and Neck Head- normocephalic, atraumatic . Neck Global Assessment- supple. no bruit auscultated on the right and no bruit auscultated on the left.   Eye Pupil- Bilateral- Regular and Round. Motion- Bilateral- EOMI.   Chest and Lung Exam Auscultation: Breath sounds:- clear at anterior chest wall and - clear at posterior chest wall. Adventitious sounds:- No Adventitious sounds.   Cardiovascular Auscultation:Rhythm- Regular rate and rhythm. Heart Sounds- S1 WNL and S2 WNL. Murmurs & Other Heart Sounds:Auscultation of the heart reveals - No  Murmurs.   Abdomen Palpation/Percussion:Tenderness- Abdomen is non-tender to palpation. Rigidity (guarding)- Abdomen is soft. Auscultation:Auscultation of the abdomen reveals - Bowel sounds normal.   Female Genitourinary Not done, not pertinent to present illness   Musculoskeletal: Her right hip has normal range of motion. No discomfort. Left hip also has flexion to 100, rotation in 30, out 40, abduction 40 but with discomfort. There is no tenderness over the greater trochanter and the exam is normal. Pulse and sensation are both intact.  MRI: We reviewed her MRI scan. 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 She also ha some associated edema in the femoral neck. There is a tiny focus of sclerotic bone in the right femoral head, but there is no collapse.   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 Wynelle Link.  Plan is to go home versus inpatinet skilled rehab.  PCP - Dr. Jenna Luo  The patient will not receive TXA (tranexamic acid) due to: History of [place].  Signed electronically by Joelene Millin, III PA-C

## 2014-04-19 NOTE — Anesthesia Postprocedure Evaluation (Signed)
  Anesthesia Post-op Note  Patient: Sheryl Hall  Procedure(s) Performed: Procedure(s) (LRB): LEFT TOTAL HIP ARTHROPLASTY ANTERIOR APPROACH (Left)  Patient Location: PACU  Anesthesia Type: General  Level of Consciousness: awake and alert   Airway and Oxygen Therapy: Patient Spontanous Breathing  Post-op Pain: mild  Post-op Assessment: Post-op Vital signs reviewed, Patient's Cardiovascular Status Stable, Respiratory Function Stable, Patent Airway and No signs of Nausea or vomiting  Last Vitals:  Filed Vitals:   04/19/14 1219  BP:   Pulse: 67  Temp: 36.3 C  Resp: 13    Post-op Vital Signs: stable   Complications: No apparent anesthesia complications

## 2014-04-20 DIAGNOSIS — D62 Acute posthemorrhagic anemia: Secondary | ICD-10-CM | POA: Diagnosis not present

## 2014-04-20 LAB — CBC
HEMATOCRIT: 32.5 % — AB (ref 36.0–46.0)
Hemoglobin: 10.8 g/dL — ABNORMAL LOW (ref 12.0–15.0)
MCH: 29.7 pg (ref 26.0–34.0)
MCHC: 33.2 g/dL (ref 30.0–36.0)
MCV: 89.3 fL (ref 78.0–100.0)
PLATELETS: 168 10*3/uL (ref 150–400)
RBC: 3.64 MIL/uL — ABNORMAL LOW (ref 3.87–5.11)
RDW: 13 % (ref 11.5–15.5)
WBC: 17.3 10*3/uL — ABNORMAL HIGH (ref 4.0–10.5)

## 2014-04-20 LAB — BASIC METABOLIC PANEL
BUN: 7 mg/dL (ref 6–23)
CO2: 23 mEq/L (ref 19–32)
CREATININE: 0.97 mg/dL (ref 0.50–1.10)
Calcium: 8.8 mg/dL (ref 8.4–10.5)
Chloride: 105 mEq/L (ref 96–112)
GFR, EST AFRICAN AMERICAN: 77 mL/min — AB (ref >90)
GFR, EST NON AFRICAN AMERICAN: 66 mL/min — AB (ref >90)
Glucose, Bld: 127 mg/dL — ABNORMAL HIGH (ref 70–99)
Potassium: 4.5 mEq/L (ref 3.7–5.3)
Sodium: 140 mEq/L (ref 137–147)

## 2014-04-20 MED ORDER — DEXLANSOPRAZOLE 60 MG PO CPDR
60.0000 mg | DELAYED_RELEASE_CAPSULE | Freq: Every day | ORAL | Status: DC
  Administered 2014-04-20 – 2014-04-21 (×2): 60 mg via ORAL
  Filled 2014-04-20 (×3): qty 1

## 2014-04-20 MED ORDER — NON FORMULARY: 60.0000 mg | Freq: Every day | Status: DC

## 2014-04-20 NOTE — Progress Notes (Signed)
Vitals took off continuous pulse ox. Sats 97%, HR 69.

## 2014-04-20 NOTE — Progress Notes (Signed)
Subjective: 1 Day Post-Op Procedure(s) (LRB): LEFT TOTAL HIP ARTHROPLASTY ANTERIOR APPROACH (Left) Patient reports pain as mild.   Patient seen in rounds with Dr. Wynelle Link. Patient is well, but has had some minor complaints of pain in the hip and thigh, requiring pain medications We will start therapy today.  Plan is to go home versus after hospital stay.  She originally wanted to look into [facility] but has been denied due to insurance issues.  Discussed with Dr. Wynelle Link and Roselyn Reef, our social worker, and she will try to make arrangements for home assistance. She will likely go home this weekend.  Objective: Vital signs in last 24 hours: Temp:  [97.4 F (36.3 C)-98.9 F (37.2 C)] 98.9 F (37.2 C) (05/28 0523) Pulse Rate:  [57-81] 70 (05/28 0523) Resp:  [8-18] 18 (05/28 0523) BP: (86-148)/(50-76) 100/50 mmHg (05/28 0523) SpO2:  [94 %-100 %] 97 % (05/28 0811) Weight:  [72.12 kg (158 lb 15.9 oz)] 72.12 kg (158 lb 15.9 oz) (05/27 1311)  Intake/Output from previous day:  Intake/Output Summary (Last 24 hours) at 04/20/14 0816 Last data filed at 04/20/14 0534  Gross per 24 hour  Intake   1980 ml  Output   2900 ml  Net   -920 ml    Intake/Output this shift:    Labs:  Recent Labs  04/20/14 0408  HGB 10.8*    Recent Labs  04/20/14 0408  WBC 17.3*  RBC 3.64*  HCT 32.5*  PLT 168    Recent Labs  04/20/14 0408  NA 140  K 4.5  CL 105  CO2 23  BUN 7  CREATININE 0.97  GLUCOSE 127*  CALCIUM 8.8   No results found for this basename: LABPT, INR,  in the last 72 hours  EXAM General - Patient is Alert and Appropriate Extremity - Neurovascular intact Sensation intact distally Dressing - dressing C/D/I Motor Function - intact, moving foot and toes well on exam.  Hemovac pulled without difficulty.  Past Medical History  Diagnosis Date  . PTSD (post-traumatic stress disorder)   . Bipolar 1 disorder   . Heart murmur   . Diverticulosis of colon   . Keratosis,  actinic   . Family hx of colon cancer     age 77-father  . GERD (gastroesophageal reflux disease)   . Chronic nausea   . S/P endoscopy 05/30/2010    Dr Rourk-> non--critical Schatzki's ring, s/p 34F dilation  . S/P colonoscopy 05/30/2010    LAX sphincter tone, anal papilla, left-sided diverticulosis, normal random biopsies,, 1 polyp-TA  . Polysubstance abuse   . Tubular adenoma of colon 05/30/2010    Next colonoscopy 05/2015  . Anxiety   . Depression   . Hyperthyroidism   . Hyperlipidemia   . Folate deficiency   . Hx of abnormal Pap smear   . Low back pain 03/01/2013    MRI with multiple levels of disc bulge  . Headache   . Chronic left hip pain   . Avascular necrosis of hip     left  . Stroke 2014    Right parietal  . Seizures 02/25/14  . UTI (urinary tract infection)     Assessment/Plan: 1 Day Post-Op Procedure(s) (LRB): LEFT TOTAL HIP ARTHROPLASTY ANTERIOR APPROACH (Left) Principal Problem:   Avascular necrosis of femur head, left Active Problems:   OA (osteoarthritis) of hip   Postoperative anemia due to acute blood loss  Estimated body mass index is 25.67 kg/(m^2) as calculated from the following:  Height as of this encounter: 5\' 6"  (1.676 m).   Weight as of this encounter: 72.12 kg (158 lb 15.9 oz). Advance diet Up with therapy Discharge home with home health if able to make home arrangements.  DVT Prophylaxis - Xarelto Weight Bearing As Tolerated left Leg Hemovac Pulled Begin Therapy   Arlee Muslim, PA-C Orthopaedic Surgery 04/20/2014, 8:16 AM

## 2014-04-20 NOTE — Progress Notes (Signed)
Physical Therapy Treatment Patient Details Name: Sheryl Hall MRN: [id number] DOB: [birth year] Today's Date: May 20, 2014    History of Present Illness s/p L DA THA due to AVN    PT Comments    Pt continues impulsive but progressing well with mobility  Follow Up Recommendations  Home health PT;SNF     Equipment Recommendations  Rolling walker with 5" wheels    Recommendations for Other Services OT consult     Precautions / Restrictions Precautions Precautions: Fall Precaution Comments: impulsive Restrictions Weight Bearing Restrictions: No Other Position/Activity Restrictions: WBAT    Mobility  Bed Mobility Overal bed mobility: Needs Assistance Bed Mobility: Supine to Sit     Supine to sit: Min assist     General bed mobility comments: cues for sequence  Transfers Overall transfer level: Needs assistance Equipment used: Rolling walker (2 wheeled) Transfers: Sit to/from Stand Sit to Stand: Min guard         General transfer comment: cues for hand/leg placement  Ambulation/Gait Ambulation/Gait assistance: Min assist Ambulation Distance (Feet): 220 Feet Assistive device: Rolling walker (2 wheeled) Gait Pattern/deviations: Step-to pattern;Step-through pattern;Decreased step length - right;Decreased step length - left;Trunk flexed     General Gait Details: cues for posture, pacing, position from RW and ER on L   Stairs            Wheelchair Mobility    Modified Rankin (Stroke Patients Only)       Balance                                    Cognition Arousal/Alertness: Awake/alert Behavior During Therapy: WFL for tasks assessed/performed Overall Cognitive Status: Within Functional Limits for tasks assessed                      Exercises Total Joint Exercises Ankle Circles/Pumps: AROM;Both;15 reps;Supine Quad Sets: AROM;Both;10 reps;Supine Heel Slides: AAROM;Left;15 reps;Supine Hip ABduction/ADduction:  AAROM;Left;10 reps;Supine    General Comments        Pertinent Vitals/Pain 4/10; premed, ice packs provided    Home Living Family/patient expects to be discharged to:: Private residence Living Arrangements: Alone Available Help at Discharge: Family Type of Home: Mobile home Home Access: Ramped entrance;Stairs to enter Entrance Stairs-Rails: Right Home Layout: One level Home Equipment: None Additional Comments: working on assistance for home:  likely not 24/7    Prior Function Level of Independence: Independent          PT Goals (current goals can now be found in the care plan section) Acute Rehab PT Goals Patient Stated Goal: get back to being independent PT Goal Formulation: With patient Time For Goal Achievement: 04/26/14 Potential to Achieve Goals: Good Progress towards PT goals: Progressing toward goals    Frequency  7X/week    PT Plan Current plan remains appropriate    Co-evaluation             End of Session Equipment Utilized During Treatment: Gait belt Activity Tolerance: Patient tolerated treatment well Patient left: in chair;with call bell/phone within reach     Time: 1037-1100 PT Time Calculation (min): 23 min  Charges:  $Gait Training: 8-22 mins $Therapeutic Exercise: 8-22 mins                    G Codes:      Mathis Fare 05/20/14, 4:40 PM

## 2014-04-20 NOTE — Evaluation (Signed)
Occupational Therapy Evaluation Patient Details Name: Sheryl Hall MRN: [id number] DOB: 1961-02-25 Today's Date: 04/20/2014    History of Present Illness s/p L DA THA due to AVN   Clinical Impression   This 53 year old female was admitted for the above surgery.  She will benefit from skilled OT to increase safety and independence with adls with supervision level goals in acute.  Pt lives alone and will benefit from [facility] to increase safety and independence with iadls.+    Follow Up Recommendations  Home health OT;Supervision/Assistance - 24 hour (initial 24/7)    Equipment Recommendations   (to be further assessed for 3:1 commode)    Recommendations for Other Services       Precautions / Restrictions Precautions Precautions: Fall Precaution Comments: impulsive Restrictions Other Position/Activity Restrictions: WBAT      Mobility Bed Mobility   Bed Mobility: Supine to Sit     Supine to sit: Min assist     General bed mobility comments: cues for sequence  Transfers     Transfers: Sit to/from Stand Sit to Stand: Min guard         General transfer comment: cues for hand/leg placement    Balance                                            ADL Overall ADL's : Needs assistance/impaired     Grooming: Set up;Sitting       Lower Body Bathing: Minimal assistance;Sit to/from stand       Lower Body Dressing: Minimal assistance;Sit to/from stand   Toilet Transfer: Min guard;Ambulation   Toileting- Clothing Manipulation and Hygiene: Min guard;Sit to/from stand         General ADL Comments: Pt is able to reach to ankle, but she does have some pain:  she wants to be independent.  Educated on AE--she feels like she would use reacher and long sponge but not sock aid.  Will issue these to her tomorrow.  Pt can perform UB adls with set up.  She doesn't feel like 3:1 commode would fit inside of her small shower:  she is agreeable to sponge  bathing and washing hair at sink at first.       Vision                     Perception     Praxis      Pertinent Vitals/Pain Moderate, R hip:  Repositioned and modified activity     Hand Dominance     Extremity/Trunk Assessment Upper Extremity Assessment Upper Extremity Assessment: Overall WFL for tasks assessed           Communication Communication Communication: No difficulties   Cognition Arousal/Alertness: Awake/alert Behavior During Therapy: WFL for tasks assessed/performed Overall Cognitive Status: Within Functional Limits for tasks assessed                     General Comments       Exercises       Shoulder Instructions      Home Living Family/patient expects to be discharged to:: Private residence Living Arrangements: Alone Available Help at Discharge: Family Type of Home: Mobile home Home Access: Ramped entrance;Stairs to enter Entrance Stairs-Number of Steps: 3 Entrance Stairs-Rails: Right Home Layout: One level  Home Equipment: None   Additional Comments: working on assistance for home:  likely not 24/7      Prior Functioning/Environment Level of Independence: Independent             OT Diagnosis: Generalized weakness   OT Problem List: Decreased strength;Decreased activity tolerance;Decreased knowledge of use of DME or AE;Decreased knowledge of precautions;Pain   OT Treatment/Interventions: Self-care/ADL training;DME and/or AE instruction;Patient/family education    OT Goals(Current goals can be found in the care plan section) Acute Rehab OT Goals Patient Stated Goal: get back to being independent OT Goal Formulation: With patient Time For Goal Achievement: 04/27/14 Potential to Achieve Goals: Good ADL Goals Pt Will Transfer to Toilet: with supervision;regular height toilet;ambulating Pt Will Perform Toileting - Clothing Manipulation and hygiene: with supervision;sit to/from stand Additional  ADL Goal #1: pt will complete adl at supervision level, including gathering clothes and using AE  OT Frequency: Min 2X/week   Barriers to D/C:            Co-evaluation              End of Session    Activity Tolerance: Patient tolerated treatment well Patient left: in bed;with call bell/phone within reach;with family/visitor present   Time: 6553-7482 OT Time Calculation (min): 22 min Charges:  OT General Charges $OT Visit: 1 Procedure OT Evaluation $Initial OT Evaluation Tier I: 1 Procedure OT Treatments $Self Care/Home Management : 8-22 mins G-Codes:    Lesle Chris 2014-04-27, 3:52 PM  Lesle Chris, OTR/L [phone] 04/27/2014

## 2014-04-20 NOTE — Progress Notes (Signed)
Physical Therapy Treatment Patient Details Name: Sheryl Hall MRN: [id number] DOB: 1961/03/29 Today's Date: 04/20/2014    History of Present Illness s/p L DA THA due to AVN    PT Comments      Follow Up Recommendations  Home health PT;SNF     Equipment Recommendations  Rolling walker with 5" wheels    Recommendations for Other Services OT consult     Precautions / Restrictions Precautions Precautions: Fall Precaution Comments: impulsive Restrictions Weight Bearing Restrictions: No Other Position/Activity Restrictions: WBAT    Mobility  Bed Mobility Overal bed mobility: Needs Assistance Bed Mobility: Supine to Sit;Sit to Supine     Supine to sit: Min guard Sit to supine: Min guard   General bed mobility comments: cues for sequence  Transfers Overall transfer level: Needs assistance Equipment used: Rolling walker (2 wheeled) Transfers: Sit to/from Stand Sit to Stand: Min guard         General transfer comment: cues for hand/leg placement  Ambulation/Gait Ambulation/Gait assistance: Min guard Ambulation Distance (Feet): 600 Feet (and to/from bathroom) Assistive device: Rolling walker (2 wheeled) Gait Pattern/deviations: Step-to pattern;Step-through pattern;Decreased step length - right;Decreased step length - left;Shuffle     General Gait Details: cues for posture, pacing, position from RW and ER on L   Stairs Stairs: Yes Stairs assistance: Min assist Stair Management: Two rails;Forwards;Step to pattern Number of Stairs: 2 General stair comments: cues for sequence and foot placment  Wheelchair Mobility    Modified Rankin (Stroke Patients Only)       Balance                                    Cognition Arousal/Alertness: Awake/alert Behavior During Therapy: WFL for tasks assessed/performed Overall Cognitive Status: Within Functional Limits for tasks assessed                      Exercises Total Joint  Exercises Ankle Circles/Pumps: AROM;Both;15 reps;Supine Quad Sets: AROM;Both;10 reps;Supine Heel Slides: AAROM;Left;15 reps;Supine Hip ABduction/ADduction: AAROM;Left;10 reps;Supine    General Comments        Pertinent Vitals/Pain 4/10; premed, ice pack provided    Home Living Family/patient expects to be discharged to:: Private residence Living Arrangements: Alone Available Help at Discharge: Family Type of Home: Mobile home Home Access: Ramped entrance;Stairs to enter Entrance Stairs-Rails: Right Home Layout: One level Home Equipment: None Additional Comments: working on assistance for home:  likely not 24/7    Prior Function Level of Independence: Independent          PT Goals (current goals can now be found in the care plan section) Acute Rehab PT Goals Patient Stated Goal: get back to being independent PT Goal Formulation: With patient Time For Goal Achievement: 04/26/14 Potential to Achieve Goals: Good Progress towards PT goals: Progressing toward goals    Frequency  7X/week    PT Plan Current plan remains appropriate    Co-evaluation             End of Session Equipment Utilized During Treatment: Gait belt Activity Tolerance: Patient tolerated treatment well Patient left: in chair;with call bell/phone within reach     Time: 8101-7510 PT Time Calculation (min): 23 min  Charges:  $Gait Training: 8-22 mins $Therapeutic Exercise: 8-22 mins $Therapeutic Activity: 8-22 mins                    G  Codes:      Mathis Fare 28-Apr-2014, 4:48 PM

## 2014-04-20 NOTE — Discharge Instructions (Addendum)
°Dr. Frank Aluisio °Total Joint Specialist °[facility] °[address] °[place], [place] [address] °[phone] ° ° ° °ANTERIOR APPROACH TOTAL HIP REPLACEMENT POSTOPERATIVE DIRECTIONS ° ° °Hip Rehabilitation, Guidelines Following Surgery  °The results of a hip operation are greatly improved after range of motion and muscle strengthening exercises. Follow all safety measures which are given to protect your hip. If any of these exercises cause increased pain or swelling in your joint, decrease the amount until you are comfortable again. Then slowly increase the exercises. Call your caregiver if you have problems or questions.  °HOME CARE INSTRUCTIONS  °Most of the following instructions are designed to prevent the dislocation of your new hip.  °Remove items at home which could result in a fall. This includes throw rugs or furniture in walking pathways.  °Continue medications as instructed at time of discharge. °· You may have some home medications which will be placed on hold until you complete the course of blood thinner medication. °· You may start showering once you are discharged home but do not submerge the incision under water. Just pat the incision dry and apply a dry gauze dressing on daily. °Do not put on socks or shoes without following the instructions of your caregivers.  °Sit on high chairs which makes it easier to stand.  °Sit on chairs with arms. Use the chair arms to help push yourself up when arising.  °Keep your leg on the side of the operation out in front of you when standing up.  °Arrange for the use of a toilet seat elevator so you are not sitting low.   °· Walk with walker as instructed.  °You may resume a sexual relationship in one month or when given the OK by your caregiver.  °Use walker as long as suggested by your caregivers.  °You may put full weight on your legs and walk as much as is comfortable. °Avoid periods of inactivity such as sitting longer than an hour  when not asleep. This helps prevent blood clots.  °You may return to work once you are cleared by your surgeon.  °Do not drive a car for 6 weeks or until released by your surgeon.  °Do not drive while taking narcotics.  °Wear elastic stockings for three weeks following surgery during the day but you may remove then at night.  °Make sure you keep all of your appointments after your operation with all of your doctors and caregivers. You should call the office at the above phone number and make an appointment for approximately two weeks after the date of your surgery. °Change the dressing daily and reapply a dry dressing each time. °Please pick up a stool softener and laxative for home use as long as you are requiring pain medications. °· Continue to use ice on the hip for pain and swelling from surgery. You may notice swelling that will progress down to the foot and ankle.  This is normal after  surgery.  Elevate the leg when you are not up walking on it.   °It is important for you to complete the blood thinner medication as prescribed by your doctor. °· Continue to use the breathing machine which will help keep your temperature down.  It is common for your temperature to cycle up and down following surgery, especially at night when you are not up moving around and exerting yourself.  The breathing machine keeps your lungs expanded and your temperature down. ° °RANGE OF MOTION AND STRENGTHENING EXERCISES  °  These exercises are designed to help you keep full movement of your hip joint. Follow your caregiver's or physical therapist's instructions. Perform all exercises about fifteen times, three times per day or as directed. Exercise both hips, even if you have had only one joint replacement. These exercises can be done on a training (exercise) mat, on the floor, on a table or on a bed. Use whatever works the best and is most comfortable for you. Use music or television while you are exercising so that the exercises are  a pleasant break in your day. This will make your life better with the exercises acting as a break in routine you can look forward to.  Lying on your back, slowly slide your foot toward your buttocks, raising your knee up off the floor. Then slowly slide your foot back down until your leg is straight again.  Lying on your back spread your legs as far apart as you can without causing discomfort.  Lying on your side, raise your upper leg and foot straight up from the floor as far as is comfortable. Slowly lower the leg and repeat.  Lying on your back, tighten up the muscle in the front of your thigh (quadriceps muscles). You can do this by keeping your leg straight and trying to raise your heel off the floor. This helps strengthen the largest muscle supporting your knee.  Lying on your back, tighten up the muscles of your buttocks both with the legs straight and with the knee bent at a comfortable angle while keeping your heel on the floor.   SKILLED REHAB INSTRUCTIONS: If the patient is transferred to a skilled rehab facility following release from the hospital, a list of the current medications will be sent to the facility for the patient to continue.  When discharged from the skilled rehab facility, please have the facility set up the patient's [place] prior to being released. Also, the skilled facility will be responsible for providing the patient with their medications at time of release from the facility to include their pain medication, the muscle relaxants, and their blood thinner medication. If the patient is still at the rehab facility at time of the two week follow up appointment, the skilled rehab facility will also need to assist the patient in arranging follow up appointment in our office and any transportation needs.  MAKE SURE YOU:  Understand these instructions.  Will watch your condition.  Will get help right away if you are not doing well or get worse.  Pick up  stool softner and laxative for home. Do not submerge incision under water. May shower. Continue to use ice for pain and swelling from surgery. Total Hip Protocol.  Take Xarelto for two and a half more weeks, then discontinue Xarelto. Once the patient has completed the Xarelto, they may resume the 81 mg Aspirin.    Information on my medicine - XARELTO (Rivaroxaban)  This medication education was reviewed with me or my healthcare representative as part of my discharge preparation.  The pharmacist that spoke with me during my hospital stay was:  Emiliano Dyer, [facility]  Why was Xarelto prescribed for you? Xarelto was prescribed for you to reduce the risk of blood clots forming after orthopedic surgery. The medical term for these abnormal blood clots is venous thromboembolism (VTE).  What do you need to know about xarelto ? Take your Xarelto ONCE DAILY at the same time every day. You may take it either with or  without food.  If you have difficulty swallowing the tablet whole, you may crush it and mix in applesauce just prior to taking your dose.  Take Xarelto exactly as prescribed by your doctor and DO NOT stop taking Xarelto without talking to the doctor who prescribed the medication.  Stopping without other VTE prevention medication to take the place of Xarelto may increase your risk of developing a clot.  After discharge, you should have regular check-up appointments with your healthcare provider that is prescribing your Xarelto.    What do you do if you miss a dose? If you miss a dose, take it as soon as you remember on the same day then continue your regularly scheduled once daily regimen the next day. Do not take two doses of Xarelto on the same day.   Important Safety Information A possible side effect of Xarelto is bleeding. You should call your healthcare provider right away if you experience any of the following:   Bleeding from an injury or your nose that does not  stop.   Unusual colored urine (red or dark brown) or unusual colored stools (red or black).   Unusual bruising for unknown reasons.   A serious fall or if you hit your head (even if there is no bleeding).  Some medicines may interact with Xarelto and might increase your risk of bleeding while on Xarelto. To help avoid this, consult your healthcare provider or pharmacist prior to using any new prescription or non-prescription medications, including herbals, vitamins, non-steroidal anti-inflammatory drugs (NSAIDs) and supplements.  This website has more information on Xarelto: [email or web address]

## 2014-04-20 NOTE — Progress Notes (Signed)
CSW met with pt / sister this am to assist with d/c planning. Pt will plan to d/c home this weekend. Pt's sister is trying to arrange time off from work to stay with her following hospital d/c. Only bed offer received  from [facility] is [facility] in [place]. Pt will have to stay in rehab for 30 days and sign over SS check ( $676 ) in order for medicaid to assist with placement. Pt is unable to make this commitment. [facility] has been notified and will assist with d/c planning.  Werner Lean LCSW [phone]

## 2014-04-20 NOTE — Care Management Note (Addendum)
    Page 1 of 1   04/20/2014     4:55:21 PM CARE MANAGEMENT NOTE 04/20/2014  Patient:  Sheryl Hall, Sheryl Hall   Account Number:  [id number]  Date Initiated:  04/20/2014  Documentation initiated by:  [facility]  Subjective/Objective Assessment:   53 Y/O F ADMITTED W/AVN L FEMUR     Action/Plan:   FROM HOME ALONE.   Anticipated DC Date:  04/22/2014   Anticipated DC Plan:  [place]  CM consult      Choice offered to / List presented to:  C-1 Patient        [place] arranged  [place]   Status of service:  In process, will continue to follow Medicare Important Message given?   (If response is "NO", the following Medicare IM given date fields will be blank) Date Medicare IM given:   Date Additional Medicare IM given:    Discharge Disposition:    Per UR Regulation:  Reviewed for med. necessity/level of care/duration of stay  If discussed at [place] of Stay Meetings, dates discussed:    Comments:  04/20/14 Kabao Leite RN,BSN NCM [facility].[place] [place].

## 2014-04-21 LAB — CBC
HCT: 30.7 % — ABNORMAL LOW (ref 36.0–46.0)
Hemoglobin: 10.2 g/dL — ABNORMAL LOW (ref 12.0–15.0)
MCH: 29.7 pg (ref 26.0–34.0)
MCHC: 33.2 g/dL (ref 30.0–36.0)
MCV: 89.5 fL (ref 78.0–100.0)
Platelets: 161 10*3/uL (ref 150–400)
RBC: 3.43 MIL/uL — ABNORMAL LOW (ref 3.87–5.11)
RDW: 13.5 % (ref 11.5–15.5)
WBC: 14.9 10*3/uL — ABNORMAL HIGH (ref 4.0–10.5)

## 2014-04-21 LAB — BASIC METABOLIC PANEL
BUN: 9 mg/dL (ref 6–23)
CO2: 27 mEq/L (ref 19–32)
Calcium: 9 mg/dL (ref 8.4–10.5)
Chloride: 106 mEq/L (ref 96–112)
Creatinine, Ser: 1.04 mg/dL (ref 0.50–1.10)
GFR calc non Af Amer: 61 mL/min — ABNORMAL LOW (ref >90)
GFR, EST AFRICAN AMERICAN: 71 mL/min — AB (ref >90)
Glucose, Bld: 125 mg/dL — ABNORMAL HIGH (ref 70–99)
POTASSIUM: 4.8 meq/L (ref 3.7–5.3)
SODIUM: 143 meq/L (ref 137–147)

## 2014-04-21 MED ORDER — RIVAROXABAN 10 MG PO TABS: 10.0000 mg | ORAL_TABLET | Freq: Every day | ORAL | Status: DC

## 2014-04-21 MED ORDER — OXYCODONE HCL 5 MG PO TABS: 5.0000 mg | ORAL_TABLET | ORAL | Status: DC | PRN

## 2014-04-21 MED ORDER — TRAMADOL HCL 50 MG PO TABS: 50.0000 mg | ORAL_TABLET | Freq: Four times a day (QID) | ORAL | Status: DC | PRN

## 2014-04-21 MED ORDER — METHOCARBAMOL 500 MG PO TABS: 500.0000 mg | ORAL_TABLET | Freq: Four times a day (QID) | ORAL | Status: DC | PRN

## 2014-04-21 NOTE — Progress Notes (Signed)
   Subjective: 2 Days Post-Op Procedure(s) (LRB): LEFT TOTAL HIP ARTHROPLASTY ANTERIOR APPROACH (Left) Patient reports pain as moderate.   Plan is to go Home after hospital stay.  Objective: Vital signs in last 24 hours: Temp:  [97.6 F (36.4 C)-98.8 F (37.1 C)] 98.1 F (36.7 C) (05/29 0525) Pulse Rate:  [64-83] 64 (05/29 0525) Resp:  [14-17] 17 (05/29 0525) BP: (91-106)/(60-70) 105/70 mmHg (05/29 0525) SpO2:  [94 %-98 %] 98 % (05/29 0525)  Intake/Output from previous day:  Intake/Output Summary (Last 24 hours) at 04/21/14 0845 Last data filed at 04/21/14 0315  Gross per 24 hour  Intake    600 ml  Output   2200 ml  Net  -1600 ml    Intake/Output this shift:    Labs:  Recent Labs  04/20/14 0408 04/21/14 0440  HGB 10.8* 10.2*    Recent Labs  04/20/14 0408 04/21/14 0440  WBC 17.3* 14.9*  RBC 3.64* 3.43*  HCT 32.5* 30.7*  PLT 168 161    Recent Labs  04/20/14 0408 04/21/14 0440  NA 140 143  K 4.5 4.8  CL 105 106  CO2 23 27  BUN 7 9  CREATININE 0.97 1.04  GLUCOSE 127* 125*  CALCIUM 8.8 9.0   No results found for this basename: LABPT, INR,  in the last 72 hours  EXAM General - Patient is Alert, Appropriate and Oriented Extremity - Neurologically intact Neurovascular intact Dressing/Incision - clean, dry, no drainage Motor Function - intact, moving foot and toes well on exam.   Past Medical History  Diagnosis Date  . PTSD (post-traumatic stress disorder)   . Bipolar 1 disorder   . Heart murmur   . Diverticulosis of colon   . Keratosis, actinic   . Family hx of colon cancer     age 26-father  . GERD (gastroesophageal reflux disease)   . Chronic nausea   . S/P endoscopy 05/30/2010    Dr Rourk-> non--critical Schatzki's ring, s/p 108F dilation  . S/P colonoscopy 05/30/2010    LAX sphincter tone, anal papilla, left-sided diverticulosis, normal random biopsies,, 1 polyp-TA  . Polysubstance abuse   . Tubular adenoma of colon 05/30/2010    Next  colonoscopy 05/2015  . Anxiety   . Depression   . Hyperthyroidism   . Hyperlipidemia   . Folate deficiency   . Hx of abnormal Pap smear   . Low back pain 03/01/2013    MRI with multiple levels of disc bulge  . Headache   . Chronic left hip pain   . Avascular necrosis of hip     left  . Stroke 2014    Right parietal  . Seizures 02/25/14  . UTI (urinary tract infection)     Assessment/Plan: 2 Days Post-Op Procedure(s) (LRB): LEFT TOTAL HIP ARTHROPLASTY ANTERIOR APPROACH (Left) Principal Problem:   Avascular necrosis of femur head, left Active Problems:   OA (osteoarthritis) of hip   Postoperative anemia due to acute blood loss   Advance diet Up with therapy Plan for discharge tomorrow  DVT Prophylaxis - Xarelto Weight Bearing As Tolerated left Leg  Gaynelle Arabian V 04/21/2014, 8:45 AM

## 2014-04-21 NOTE — Progress Notes (Signed)
Occupational Therapy Treatment Patient Details Name: Sheryl Hall MRN: [id number] DOB: 28-Sep-1961 Today's Date: 04/21/2014    History of present illness s/p L DA THA due to AVN   OT comments  Pt is making progress with OT.  Pt wanted to wash hair today--performed at sink.  Less impulsive today  Follow Up Recommendations  Home health OT;Supervision/Assistance - 24 hour    Equipment Recommendations  3 in 1 bedside comode    Recommendations for Other Services      Precautions / Restrictions Precautions Precautions: Fall Precaution Comments: impulsive Restrictions Other Position/Activity Restrictions: WBAT       Mobility Bed Mobility         Supine to sit: Supervision     General bed mobility comments: cues for sequence  Transfers       Sit to Stand: Supervision         General transfer comment: cues for hand/leg placement    Balance                                   ADL Overall ADL's : Needs assistance/impaired             Lower Body Bathing: Min guard;With adaptive equipment;Sit to/from stand (simulated with long sponge)       Lower Body Dressing: Minimal assistance;With adaptive equipment;Sit to/from stand   Toilet Transfer: Supervision/safety;Ambulation;BSC   Toileting- Water quality scientist and Hygiene: Supervision/safety;Sit to/from stand         General ADL Comments: pt stood at sink for adl and to wash hair with supervision for bathing and mod a to wash hair.  Reviewed AE and pt simulated with long sponge and used reacher:  needs practice with this.        Vision                     Perception     Praxis      Cognition   Behavior During Therapy: [facility] for tasks assessed/performed Overall Cognitive Status: Within Functional Limits for tasks assessed                       Extremity/Trunk Assessment               Exercises     Shoulder Instructions       General Comments       Pertinent Vitals/ Pain       R hip sore; repositioned.  Pt was premedicated  Home Living                                          Prior Functioning/Environment              Frequency Min 2X/week     Progress Toward Goals  OT Goals(current goals can now be found in the care plan section)  Progress towards OT goals: Progressing toward goals     Plan      Co-evaluation                 End of Session     Activity Tolerance Patient tolerated treatment well   Patient Left in chair;with call bell/phone within reach   Nurse Communication          Time: 2229-7989 OT Time Calculation (  min): 25 min  Charges: OT General Charges $OT Visit: 1 Procedure OT Treatments $Self Care/Home Management : 23-37 mins  [facility] 04/21/2014, 11:58 AM   Lesle Chris, OTR/L [phone] 04/21/2014

## 2014-04-21 NOTE — Discharge Summary (Signed)
Physician Discharge Summary   Patient ID: Sheryl Hall MRN: [id number] DOB/AGE: [birth year] 52 y.o.  Admit date: 04/19/2014 Discharge date: 04/22/2014  Primary Diagnosis:  Avascular necrosis of the Left hip.   Admission Diagnoses:  Past Medical History  Diagnosis Date  . PTSD (post-traumatic stress disorder)   . Bipolar 1 disorder   . Heart murmur   . Diverticulosis of colon   . Keratosis, actinic   . Family hx of colon cancer     age 34-father  . GERD (gastroesophageal reflux disease)   . Chronic nausea   . S/P endoscopy 05/30/2010    Dr Rourk-> non--critical Schatzki's ring, s/p 10F dilation  . S/P colonoscopy 05/30/2010    LAX sphincter tone, anal papilla, left-sided diverticulosis, normal random biopsies,, 1 polyp-TA  . Polysubstance abuse   . Tubular adenoma of colon 05/30/2010    Next colonoscopy 05/2015  . Anxiety   . Depression   . Hyperthyroidism   . Hyperlipidemia   . Folate deficiency   . Hx of abnormal Pap smear   . Low back pain 03/01/2013    MRI with multiple levels of disc bulge  . Headache   . Chronic left hip pain   . Avascular necrosis of hip     left  . Stroke 2014    Right parietal  . Seizures 02/25/14  . UTI (urinary tract infection)    Discharge Diagnoses:   Principal Problem:   Avascular necrosis of femur head, left Active Problems:   OA (osteoarthritis) of hip   Postoperative anemia due to acute blood loss  Estimated body mass index is 25.67 kg/(m^2) as calculated from the following:   Height as of this encounter: '5\' 6"'  (1.676 m).   Weight as of this encounter: 72.12 kg (158 lb 15.9 oz).  Procedure(s) (LRB): LEFT TOTAL HIP ARTHROPLASTY ANTERIOR APPROACH (Left)   Consults: None  HPI: Sheryl Hall is a 53 y.o. female who has advanced end-  stage arthritis of his Left hip with progressively worsening pain and  dysfunction.The patient has failed nonoperative management and presents for  total hip arthroplasty.   Laboratory Data: Admission  on 04/19/2014, Discharged on 04/22/2014  Component Date Value Ref Range Status  . ABO/RH(D) 04/19/2014 O POS   Final  . Antibody Screen 04/19/2014 NEG   Final  . Sample Expiration 04/19/2014 04/22/2014   Final  . ABO/RH(D) 04/19/2014 O POS   Final  . WBC 04/20/2014 17.3* 4.0 - 10.5 K/uL Final  . RBC 04/20/2014 3.64* 3.87 - 5.11 MIL/uL Final  . Hemoglobin 04/20/2014 10.8* 12.0 - 15.0 g/dL Final  . HCT 04/20/2014 32.5* 36.0 - 46.0 % Final  . MCV 04/20/2014 89.3  78.0 - 100.0 fL Final  . MCH 04/20/2014 29.7  26.0 - 34.0 pg Final  . MCHC 04/20/2014 33.2  30.0 - 36.0 g/dL Final  . RDW 04/20/2014 13.0  11.5 - 15.5 % Final  . Platelets 04/20/2014 168  150 - 400 K/uL Final  . Sodium 04/20/2014 140  137 - 147 mEq/L Final  . Potassium 04/20/2014 4.5  3.7 - 5.3 mEq/L Final   Comment: MODERATE HEMOLYSIS                          HEMOLYSIS AT THIS LEVEL MAY AFFECT RESULT  . Chloride 04/20/2014 105  96 - 112 mEq/L Final  . CO2 04/20/2014 23  19 - 32 mEq/L Final  . Glucose, Bld 04/20/2014 127* 70 -  99 mg/dL Final  . BUN 04/20/2014 7  6 - 23 mg/dL Final  . Creatinine, Ser 04/20/2014 0.97  0.50 - 1.10 mg/dL Final  . Calcium 04/20/2014 8.8  8.4 - 10.5 mg/dL Final  . GFR calc non Af Amer 04/20/2014 66* >90 mL/min Final  . GFR calc Af Amer 04/20/2014 77* >90 mL/min Final   Comment: (NOTE)                          The eGFR has been calculated using the CKD EPI equation.                          This calculation has not been validated in all clinical situations.                          eGFR's persistently <90 mL/min signify possible Chronic Kidney                          Disease.  . WBC 04/21/2014 14.9* 4.0 - 10.5 K/uL Final  . RBC 04/21/2014 3.43* 3.87 - 5.11 MIL/uL Final  . Hemoglobin 04/21/2014 10.2* 12.0 - 15.0 g/dL Final  . HCT 04/21/2014 30.7* 36.0 - 46.0 % Final  . MCV 04/21/2014 89.5  78.0 - 100.0 fL Final  . MCH 04/21/2014 29.7  26.0 - 34.0 pg Final  . MCHC 04/21/2014 33.2  30.0 - 36.0 g/dL  Final  . RDW 04/21/2014 13.5  11.5 - 15.5 % Final  . Platelets 04/21/2014 161  150 - 400 K/uL Final  . Sodium 04/21/2014 143  137 - 147 mEq/L Final  . Potassium 04/21/2014 4.8  3.7 - 5.3 mEq/L Final  . Chloride 04/21/2014 106  96 - 112 mEq/L Final  . CO2 04/21/2014 27  19 - 32 mEq/L Final  . Glucose, Bld 04/21/2014 125* 70 - 99 mg/dL Final  . BUN 04/21/2014 9  6 - 23 mg/dL Final  . Creatinine, Ser 04/21/2014 1.04  0.50 - 1.10 mg/dL Final  . Calcium 04/21/2014 9.0  8.4 - 10.5 mg/dL Final  . GFR calc non Af Amer 04/21/2014 61* >90 mL/min Final  . GFR calc Af Amer 04/21/2014 71* >90 mL/min Final   Comment: (NOTE)                          The eGFR has been calculated using the CKD EPI equation.                          This calculation has not been validated in all clinical situations.                          eGFR's persistently <90 mL/min signify possible Chronic Kidney                          Disease.  . WBC 04/22/2014 10.2  4.0 - 10.5 K/uL Final   WHITE COUNT CONFIRMED ON SMEAR  . RBC 04/22/2014 3.28* 3.87 - 5.11 MIL/uL Final  . Hemoglobin 04/22/2014 9.5* 12.0 - 15.0 g/dL Final  . HCT 04/22/2014 29.4* 36.0 - 46.0 % Final  . MCV 04/22/2014 89.6  78.0 - 100.0 fL Final  . MCH 04/22/2014 29.0  26.0 -  34.0 pg Final  . MCHC 04/22/2014 32.3  30.0 - 36.0 g/dL Final  . RDW 04/22/2014 13.5  11.5 - 15.5 % Final  . Platelets 04/22/2014 154  150 - 400 K/uL Final  Hospital Outpatient Visit on 04/11/2014  Component Date Value Ref Range Status  . Color, Urine 04/11/2014 ORANGE* YELLOW Final   BIOCHEMICALS MAY BE AFFECTED BY COLOR  . APPearance 04/11/2014 CLOUDY* CLEAR Final  . Specific Gravity, Urine 04/11/2014 1.004* 1.005 - 1.030 Final  . pH 04/11/2014 6.0  5.0 - 8.0 Final  . Glucose, UA 04/11/2014 NEGATIVE  NEGATIVE mg/dL Final  . Hgb urine dipstick 04/11/2014 NEGATIVE  NEGATIVE Final  . Bilirubin Urine 04/11/2014 NEGATIVE  NEGATIVE Final  . Ketones, ur 04/11/2014 NEGATIVE  NEGATIVE mg/dL  Final  . Protein, ur 04/11/2014 NEGATIVE  NEGATIVE mg/dL Final  . Urobilinogen, UA 04/11/2014 1.0  0.0 - 1.0 mg/dL Final  . Nitrite 04/11/2014 POSITIVE* NEGATIVE Final  . Leukocytes, UA 04/11/2014 NEGATIVE  NEGATIVE Final  . MRSA, PCR 04/11/2014 NEGATIVE  NEGATIVE Final  . Staphylococcus aureus 04/11/2014 NEGATIVE  NEGATIVE Final   Comment:                                 The Xpert SA Assay (FDA                          approved for NASAL specimens                          in patients over 52 years of age),                          is one component of                          a comprehensive surveillance                          program.  Test performance has                          been validated by [employer] for patients greater                          than or equal to 57 year old.                          It is not intended                          to diagnose infection nor to                          guide or monitor treatment.  Marland Kitchen aPTT 04/11/2014 30  24 - 37 seconds Final  . WBC 04/11/2014 8.4  4.0 - 10.5 K/uL Final  . RBC 04/11/2014 4.70  3.87 - 5.11 MIL/uL Final  . Hemoglobin 04/11/2014 13.7  12.0 - 15.0 g/dL Final  .  HCT 04/11/2014 41.1  36.0 - 46.0 % Final  . MCV 04/11/2014 87.4  78.0 - 100.0 fL Final  . MCH 04/11/2014 29.1  26.0 - 34.0 pg Final  . MCHC 04/11/2014 33.3  30.0 - 36.0 g/dL Final  . RDW 04/11/2014 12.8  11.5 - 15.5 % Final  . Platelets 04/11/2014 202  150 - 400 K/uL Final  . Sodium 04/11/2014 139  137 - 147 mEq/L Final  . Potassium 04/11/2014 4.0  3.7 - 5.3 mEq/L Final  . Chloride 04/11/2014 102  96 - 112 mEq/L Final  . CO2 04/11/2014 25  19 - 32 mEq/L Final  . Glucose, Bld 04/11/2014 97  70 - 99 mg/dL Final  . BUN 04/11/2014 8  6 - 23 mg/dL Final  . Creatinine, Ser 04/11/2014 1.02  0.50 - 1.10 mg/dL Final  . Calcium 04/11/2014 9.3  8.4 - 10.5 mg/dL Final  . Total Protein 04/11/2014 6.3  6.0 - 8.3 g/dL Final  . Albumin  04/11/2014 3.9  3.5 - 5.2 g/dL Final  . AST 04/11/2014 12  0 - 37 U/L Final  . ALT 04/11/2014 9  0 - 35 U/L Final  . Alkaline Phosphatase 04/11/2014 109  39 - 117 U/L Final  . Total Bilirubin 04/11/2014 0.3  0.3 - 1.2 mg/dL Final  . GFR calc non Af Amer 04/11/2014 63* >90 mL/min Final  . GFR calc Af Amer 04/11/2014 73* >90 mL/min Final   Comment: (NOTE)                          The eGFR has been calculated using the CKD EPI equation.                          This calculation has not been validated in all clinical situations.                          eGFR's persistently <90 mL/min signify possible Chronic Kidney                          Disease.  Marland Kitchen Prothrombin Time 04/11/2014 12.0  11.6 - 15.2 seconds Final  . INR 04/11/2014 0.90  0.00 - 1.49 Final  . Squamous Epithelial / LPF 04/11/2014 RARE  RARE Final  . WBC, UA 04/11/2014 0-2  <3 WBC/hpf Final  . RBC / HPF 04/11/2014 0-2  <3 RBC/hpf Final  . Bacteria, UA 04/11/2014 FEW* RARE Final     X-Rays:Dg Hip Complete Left  04/11/2014   CLINICAL DATA:  53 year old female with hip AVN.  EXAM: LEFT HIP - COMPLETE 2+ VIEW  COMPARISON:  12/19/2013  FINDINGS: Bilateral femoral head AVN is identified.  There is no evidence of flattening or collapse of the femoral heads.  No acute fracture, subluxation or dislocation identified.  No other bony abnormalities are noted.  IMPRESSION: Bilateral femoral head AVN.   Electronically Signed   By: Hassan Rowan M.D.   On: 04/11/2014 15:16   Dg Pelvis Portable  04/19/2014   CLINICAL DATA:  Postop left hip arthroplasty  EXAM: PORTABLE PELVIS 1-2 VIEWS  COMPARISON:  None.  FINDINGS: Left hip prosthesis is well-seated and aligned. No acute fracture or evidence of an operative complication.  IMPRESSION: Well aligned left hip prosthesis.   Electronically Signed   By: Lajean Manes M.D.   On: 04/19/2014 12:13  Dg C-arm 1-60 Min-no Report  04/19/2014   CLINICAL DATA: anterior hip   C-ARM 1-60 MINUTES  Fluoroscopy was utilized  by the requesting physician.  No radiographic  interpretation.     EKG: Orders placed during the hospital encounter of 02/25/14  . ED EKG  . ED EKG  . EKG 12-LEAD  . EKG 12-LEAD  . EKG     Hospital Course: Patient was admitted to [facility] and taken to the OR and underwent the above state procedure without complications.  Patient tolerated the procedure well and was later transferred to the recovery room and then to the orthopaedic floor for postoperative care.  They were given PO and IV analgesics for pain control following their surgery.  They were given 24 hours of postoperative antibiotics of  Anti-infectives   Start     Dose/Rate Route Frequency Ordered Stop   04/19/14 1600  ceFAZolin (ANCEF) IVPB 1 g/50 mL premix     1 g 100 mL/hr over 30 Minutes Intravenous Every 6 hours 04/19/14 1247 04/19/14 2250   04/19/14 0732  ceFAZolin (ANCEF) IVPB 2 g/50 mL premix  Status:  Discontinued     2 g 100 mL/hr over 30 Minutes Intravenous On call to O.R. 04/19/14 0732 04/19/14 1225     and started on DVT prophylaxis in the form of Xarelto.   PT and OT were ordered for total hip protocol.  The patient was allowed to be WBAT with therapy. Discharge planning was consulted to help with postop disposition and equipment needs.  Patient had a decent night on the evening of surgery.  They started to get up OOB with therapy on day one.  Hemovac drain was pulled without difficulty.  Continued to work with therapy into day two.  Dressing was changed on day two and the incision was healing well.  By day three, the patient had progressed with therapy and meeting their goals.  Incision was healing well.  Patient was seen in rounds and was ready to go home.  Diet: Regular diet Activity:WBAT Follow-up:in 2 weeks Disposition - Home Discharged Condition: good       Discharge Instructions   Call MD / Call 911    Complete by:  As directed   If you experience chest pain or shortness of breath, CALL  911 and be transported to the hospital emergency room.  If you develope a fever above 101 F, pus (white drainage) or increased drainage or redness at the wound, or calf pain, call your surgeon's office.     Change dressing    Complete by:  As directed   You may change your dressing dressing daily with sterile 4 x 4 inch gauze dressing and paper tape.  Do not submerge the incision under water.     Constipation Prevention    Complete by:  As directed   Drink plenty of fluids.  Prune juice may be helpful.  You may use a stool softener, such as Colace (over the counter) 100 mg twice a day.  Use MiraLax (over the counter) for constipation as needed.     Diet general    Complete by:  As directed      Discharge instructions    Complete by:  As directed   Pick up stool softner and laxative for home. Do not submerge incision under water. May shower. Continue to use ice for pain and swelling from surgery.  Total Hip Protocol.  Take Xarelto for two and a half more  weeks, then discontinue Xarelto. Once the patient has completed the Xarelto, they may resume the 81 mg Aspirin.     Do not put a pillow under the knee. Place it under the heel.    Complete by:  As directed      Do not sit on low chairs, stoools or toilet seats, as it may be difficult to get up from low surfaces    Complete by:  As directed      Driving restrictions    Complete by:  As directed   No driving until released by the physician.     Increase activity slowly as tolerated    Complete by:  As directed      Lifting restrictions    Complete by:  As directed   No lifting until released by the physician.     Patient may shower    Complete by:  As directed   You may shower without a dressing once there is no drainage.  Do not wash over the wound.  If drainage remains, do not shower until drainage stops.     TED hose    Complete by:  As directed   Use stockings (TED hose) for 3 weeks on both leg(s).  You may remove them at night  for sleeping.     Weight bearing as tolerated    Complete by:  As directed             Medication List    STOP taking these medications       aspirin 81 MG tablet     folic acid 419 MCG tablet  Commonly known as:  FOLVITE     ibuprofen 800 MG tablet  Commonly known as:  ADVIL,MOTRIN     PROBIOTIC PO      TAKE these medications       budesonide-formoterol 160-4.5 MCG/ACT inhaler  Commonly known as:  SYMBICORT  Inhale 1 puff into the lungs 2 (two) times daily.     clonazePAM 1 MG tablet  Commonly known as:  KLONOPIN  Take 0.5-1 mg by mouth 3 (three) times daily. Takes 27m in am ,  126mat 3 pm and 0.5 mg at bedtime     lamoTRIgine 100 MG tablet  Commonly known as:  LAMICTAL  Take 225 mg by mouth every morning. Takes 200108mith 8m41mr a total of 28mg42mly     lamoTRIgine 25 MG tablet  Commonly known as:  LAMICTAL  Take 50 mg by mouth every morning. Takes 200mg 5m 8mg f41m total of 28mg da50m    levETIRAcetam 250 MG tablet  Commonly known as:  KEPPRA  Take 250-500 mg by mouth 2 (two) times daily. Takes 250 mg in the morning and 500 mg at night total 750 mg.     levothyroxine 50 MCG tablet  Commonly known as:  SYNTHROID, LEVOTHROID  Take 50 mcg by mouth daily before breakfast.     methocarbamol 500 MG tablet  Commonly known as:  ROBAXIN  Take 1 tablet (500 mg total) by mouth every 6 (six) hours as needed for muscle spasms.     oxyCODONE 5 MG immediate release tablet  Commonly known as:  Oxy IR/ROXICODONE  Take 1-2 tablets (5-10 mg total) by mouth every 3 (three) hours as needed for moderate pain, severe pain or breakthrough pain.     rivaroxaban 10 MG Tabs tablet  Commonly known as:  XARELTO  - Take 1 tablet (10 mg total)  by mouth daily with breakfast. Take Xarelto for two and a half more weeks, then discontinue Xarelto.  - Once the patient has completed the Xarelto, they may resume the 81 mg Aspirin.     traMADol 50 MG tablet  Commonly known as:   ULTRAM  Take 1-2 tablets (50-100 mg total) by mouth every 6 (six) hours as needed (mild to moderate pain).     traZODone 100 MG tablet  Commonly known as:  DESYREL  Take 100 mg by mouth at bedtime.       Follow-up Information   Follow up with [name], MD. Schedule an appointment as soon as possible for a visit on 05/02/2014. (Call [phone] Monday to make the appointment)    Specialty:  Orthopedic Surgery   Contact information:   [address] [place] [address] [place] [address] [phone]       Follow up with [facility]. [facility])    Contact information:   [address] [place] [address] [phone]       Signed: Arlee Muslim, PA-C Orthopaedic Surgery 04/28/2014, 1:06 PM

## 2014-04-21 NOTE — Progress Notes (Signed)
Pt up to bathroom with assistance and developed anxiety ?? And became lightheaded and pale. Assisted back to bed where she physically shook and c/o anxiety x 10 minutes. Supported emotionally as needed.

## 2014-04-21 NOTE — Progress Notes (Signed)
Physical Therapy Treatment Patient Details Name: Sheryl Hall MRN: [id number] DOB: 07/05/1961 Today's Date: 2014/04/25    History of Present Illness s/p L DA THA due to AVN    PT Comments    Progressing well.  From PT perspective, should be ready for d/c in am.  Follow Up Recommendations  Home health PT;SNF     Equipment Recommendations  Rolling walker with 5" wheels    Recommendations for Other Services OT consult     Precautions / Restrictions Precautions Precautions: Fall Precaution Comments: impulsive Restrictions Weight Bearing Restrictions: No Other Position/Activity Restrictions: WBAT    Mobility  Bed Mobility Overal bed mobility: Needs Assistance Bed Mobility: Supine to Sit;Sit to Supine     Supine to sit: Supervision Sit to supine: Supervision   General bed mobility comments: cues for sequence  Transfers Overall transfer level: Needs assistance Equipment used: Rolling walker (2 wheeled) Transfers: Sit to/from Stand Sit to Stand: Supervision         General transfer comment: cues for hand/leg placement  Ambulation/Gait Ambulation/Gait assistance: Min guard;Supervision Ambulation Distance (Feet): 250 Feet Assistive device: Rolling walker (2 wheeled) Gait Pattern/deviations: Shuffle;Step-to pattern;Step-through pattern     General Gait Details: cues for posture, pacing, position from RW and ER on L   Stairs         General stair comments: cues for sequence and foot placment  Wheelchair Mobility    Modified Rankin (Stroke Patients Only)       Balance                                    Cognition Arousal/Alertness: Awake/alert Behavior During Therapy: WFL for tasks assessed/performed Overall Cognitive Status: Within Functional Limits for tasks assessed                      Exercises Total Joint Exercises Ankle Circles/Pumps: AROM;Both;15 reps;Supine Quad Sets: AROM;Both;10 reps;Supine Heel Slides:  AAROM;Left;Supine;20 reps Hip ABduction/ADduction: AAROM;Left;Supine;15 reps Long Arc Quad: AROM;Left;10 reps;Seated    General Comments        Pertinent Vitals/Pain 5/10; meds requested, ice packs provided    Home Living                      Prior Function            PT Goals (current goals can now be found in the care plan section) Acute Rehab PT Goals Patient Stated Goal: get back to being independent PT Goal Formulation: With patient Time For Goal Achievement: 04/26/14 Potential to Achieve Goals: Good Progress towards PT goals: Progressing toward goals    Frequency  7X/week    PT Plan Current plan remains appropriate    Co-evaluation             End of Session   Activity Tolerance: Patient tolerated treatment well Patient left: in bed;with call bell/phone within reach     Time: 1505-1530 PT Time Calculation (min): 25 min  Charges:  $Gait Training: 8-22 mins $Therapeutic Exercise: 8-22 mins                    G Codes:      Mathis Fare 2014/04/25, 4:23 PM

## 2014-04-21 NOTE — Progress Notes (Signed)
PT Cancellation Note  Patient Details Name: Sheryl Hall MRN: [id number] DOB: 08-Feb-1961   Cancelled Treatment:     PT attempted x 2 this am - pt refusing 2* to nausea.  Will follow   Mathis Fare 04/21/2014, 12:08 PM

## 2014-04-22 LAB — CBC
HEMATOCRIT: 29.4 % — AB (ref 36.0–46.0)
Hemoglobin: 9.5 g/dL — ABNORMAL LOW (ref 12.0–15.0)
MCH: 29 pg (ref 26.0–34.0)
MCHC: 32.3 g/dL (ref 30.0–36.0)
MCV: 89.6 fL (ref 78.0–100.0)
PLATELETS: 154 10*3/uL (ref 150–400)
RBC: 3.28 MIL/uL — AB (ref 3.87–5.11)
RDW: 13.5 % (ref 11.5–15.5)
WBC: 10.2 10*3/uL (ref 4.0–10.5)

## 2014-04-22 NOTE — Progress Notes (Signed)
Physical Therapy Treatment Patient Details Name: ARRIANNA CATALA MRN: [id number] DOB: 11-10-1961 Today's Date: 29-Apr-2014    History of Present Illness s/p L DA THA due to AVN    PT Comments      Follow Up Recommendations  Home health PT;SNF     Equipment Recommendations  Rolling walker with 5" wheels    Recommendations for Other Services OT consult     Precautions / Restrictions Precautions Precautions: Fall Precaution Comments: impulsive Restrictions Weight Bearing Restrictions: No Other Position/Activity Restrictions: WBAT    Mobility  Bed Mobility Overal bed mobility: Modified Independent Bed Mobility: Supine to Sit;Sit to Supine     Supine to sit: Modified independent (Device/Increase time) Sit to supine: Modified independent (Device/Increase time)      Transfers Overall transfer level: Needs assistance Equipment used: Rolling walker (2 wheeled) Transfers: Sit to/from Stand Sit to Stand: Supervision         General transfer comment: cues for use of UEs to self assist  Ambulation/Gait Ambulation/Gait assistance: Supervision Ambulation Distance (Feet): 400 Feet Assistive device: Rolling walker (2 wheeled) Gait Pattern/deviations: Step-through pattern;Shuffle     General Gait Details: cues for position from RW   Stairs            Wheelchair Mobility    Modified Rankin (Stroke Patients Only)       Balance                                    Cognition Arousal/Alertness: Awake/alert Behavior During Therapy: WFL for tasks assessed/performed Overall Cognitive Status: Within Functional Limits for tasks assessed                      Exercises Total Joint Exercises Ankle Circles/Pumps: AROM;Both;15 reps;Supine Quad Sets: AROM;Both;10 reps;Supine Gluteal Sets: AROM;Both;10 reps;Supine Heel Slides: AAROM;Left;Supine;20 reps Hip ABduction/ADduction: AAROM;Left;Supine;15 reps    General Comments         Pertinent Vitals/Pain     Home Living                      Prior Function            PT Goals (current goals can now be found in the care plan section) Acute Rehab PT Goals Patient Stated Goal: get back to being independent PT Goal Formulation: With patient Time For Goal Achievement: 04/26/14 Potential to Achieve Goals: Good Progress towards PT goals: Progressing toward goals    Frequency  7X/week    PT Plan Current plan remains appropriate    Co-evaluation             End of Session   Activity Tolerance: Patient tolerated treatment well Patient left: in bed;with call bell/phone within reach     Time: 0935-1004 PT Time Calculation (min): 29 min  Charges:  $Gait Training: 8-22 mins $Therapeutic Exercise: 8-22 mins                    G Codes:      Mathis Fare Apr 29, 2014, 12:21 PM

## 2014-04-22 NOTE — Progress Notes (Signed)
   Subjective: 3 Days Post-Op Procedure(s) (LRB): LEFT TOTAL HIP ARTHROPLASTY ANTERIOR APPROACH (Left) Patient reports pain as mild.   Plan is to go Home after hospital stay.  Objective: Vital signs in last 24 hours: Temp:  [98 F (36.7 C)-98.6 F (37 C)] 98.6 F (37 C) (05/30 0503) Pulse Rate:  [75-82] 82 (05/30 0503) Resp:  [16-17] 16 (05/30 0503) BP: (87-104)/(58-67) 87/58 mmHg (05/30 0503) SpO2:  [95 %-99 %] 95 % (05/30 0503)  Intake/Output from previous day:  Intake/Output Summary (Last 24 hours) at 04/22/14 0730 Last data filed at 04/22/14 0503  Gross per 24 hour  Intake   3175 ml  Output      0 ml  Net   3175 ml    Intake/Output this shift:    Labs:  Recent Labs  04/20/14 0408 04/21/14 0440 04/22/14 0443  HGB 10.8* 10.2* 9.5*    Recent Labs  04/21/14 0440 04/22/14 0443  WBC 14.9* 10.2  RBC 3.43* 3.28*  HCT 30.7* 29.4*  PLT 161 154    Recent Labs  04/20/14 0408 04/21/14 0440  NA 140 143  K 4.5 4.8  CL 105 106  CO2 23 27  BUN 7 9  CREATININE 0.97 1.04  GLUCOSE 127* 125*  CALCIUM 8.8 9.0   No results found for this basename: LABPT, INR,  in the last 72 hours  EXAM General - Patient is Alert, Appropriate and Oriented Extremity - Neurologically intact Neurovascular intact No cellulitis present Compartment soft Dressing/Incision - clean, dry, no drainage Motor Function - intact, moving foot and toes well on exam.   Past Medical History  Diagnosis Date  . PTSD (post-traumatic stress disorder)   . Bipolar 1 disorder   . Heart murmur   . Diverticulosis of colon   . Keratosis, actinic   . Family hx of colon cancer     age 51-father  . GERD (gastroesophageal reflux disease)   . Chronic nausea   . S/P endoscopy 05/30/2010    Dr Rourk-> non--critical Schatzki's ring, s/p 42F dilation  . S/P colonoscopy 05/30/2010    LAX sphincter tone, anal papilla, left-sided diverticulosis, normal random biopsies,, 1 polyp-TA  . Polysubstance abuse   .  Tubular adenoma of colon 05/30/2010    Next colonoscopy 05/2015  . Anxiety   . Depression   . Hyperthyroidism   . Hyperlipidemia   . Folate deficiency   . Hx of abnormal Pap smear   . Low back pain 03/01/2013    MRI with multiple levels of disc bulge  . Headache   . Chronic left hip pain   . Avascular necrosis of hip     left  . Stroke 2014    Right parietal  . Seizures 02/25/14  . UTI (urinary tract infection)     Assessment/Plan: 3 Days Post-Op Procedure(s) (LRB): LEFT TOTAL HIP ARTHROPLASTY ANTERIOR APPROACH (Left) Principal Problem:   Avascular necrosis of femur head, left Active Problems:   OA (osteoarthritis) of hip   Postoperative anemia due to acute blood loss   Discharge home with home health  DVT Prophylaxis - Xarelto Weight Bearing As Tolerated left Leg  Pilar Plate Elouise Divelbiss V 04/22/2014, 7:30 AM

## 2014-04-22 NOTE — Progress Notes (Signed)
CARE MANAGEMENT NOTE 04/22/2014  Patient:  Sheryl Hall, Sheryl Hall   Account Number:  [id number]  Date Initiated:  04/20/2014  Documentation initiated by:  [facility]  Subjective/Objective Assessment:   53 Y/O F ADMITTED W/AVN L FEMUR     Action/Plan:   FROM HOME ALONE.   Anticipated DC Date:  04/22/2014   Anticipated DC Plan:  [place]  CM consult      Choice offered to / List presented to:  C-1 Patient   DME arranged  3-N-1  Vassie Moselle      DME agency  [place] arranged  [place]   Status of service:  Completed, signed off Medicare Important Message given?  NA - LOS <3 / Initial given by admissions (If response is "NO", the following Medicare IM given date fields will be blank) Date Medicare IM given:   Date Additional Medicare IM given:    Discharge Disposition:  [place]  Per UR Regulation:  Reviewed for med. necessity/level of care/duration of stay  If discussed at [place] of Stay Meetings, dates discussed:    Comments:  04/22/2014 1055 NCM spoke to pt and states 3n1 and RW needed. Contacted [facility] for DME. Jonnie Finner RN CCM Case Mgmt phone [phone]  [id number] that Sheryl Hall will provide hhc for this patient.  04/20/14 KATHY MAHABIR RN,BSN NCM [email or web address] [place].

## 2014-04-22 NOTE — Progress Notes (Signed)
Discharged from floor via w/c, family with pt. No changes in assessment. Sheryl Hall  

## 2014-04-24 ENCOUNTER — Other Ambulatory Visit: Payer: Self-pay | Admitting: Physician Assistant

## 2014-04-24 NOTE — Telephone Encounter (Signed)
Medication refilled per protocol. 

## 2014-06-08 ENCOUNTER — Ambulatory Visit (INDEPENDENT_AMBULATORY_CARE_PROVIDER_SITE_OTHER): Payer: Medicaid Other | Admitting: Physician Assistant

## 2014-06-08 ENCOUNTER — Encounter: Payer: Self-pay | Admitting: Physician Assistant

## 2014-06-08 VITALS — BP 136/72 | HR 76 | Temp 97.7°F | Resp 14 | Ht 65.0 in | Wt 154.0 lb

## 2014-06-08 DIAGNOSIS — R112 Nausea with vomiting, unspecified: Secondary | ICD-10-CM

## 2014-06-08 DIAGNOSIS — R142 Eructation: Secondary | ICD-10-CM

## 2014-06-08 DIAGNOSIS — R143 Flatulence: Secondary | ICD-10-CM

## 2014-06-08 DIAGNOSIS — R1310 Dysphagia, unspecified: Secondary | ICD-10-CM

## 2014-06-08 DIAGNOSIS — R141 Gas pain: Secondary | ICD-10-CM

## 2014-06-08 DIAGNOSIS — K3184 Gastroparesis: Secondary | ICD-10-CM

## 2014-06-08 DIAGNOSIS — K219 Gastro-esophageal reflux disease without esophagitis: Secondary | ICD-10-CM

## 2014-06-08 DIAGNOSIS — D126 Benign neoplasm of colon, unspecified: Secondary | ICD-10-CM

## 2014-06-08 DIAGNOSIS — Z800 Family history of malignant neoplasm of digestive organs: Secondary | ICD-10-CM

## 2014-06-08 DIAGNOSIS — N912 Amenorrhea, unspecified: Secondary | ICD-10-CM

## 2014-06-08 DIAGNOSIS — K589 Irritable bowel syndrome without diarrhea: Secondary | ICD-10-CM

## 2014-06-08 DIAGNOSIS — E039 Hypothyroidism, unspecified: Secondary | ICD-10-CM

## 2014-06-08 DIAGNOSIS — R140 Abdominal distension (gaseous): Secondary | ICD-10-CM

## 2014-06-08 LAB — PREGNANCY, URINE: Preg Test, Ur: NEGATIVE

## 2014-06-08 MED ORDER — METOCLOPRAMIDE HCL 10 MG PO TABS: 10.0000 mg | ORAL_TABLET | Freq: Three times a day (TID) | ORAL | Status: DC

## 2014-06-08 NOTE — Progress Notes (Signed)
Patient ID: ERMALEE MEALY MRN: [id number], DOB: 03-10-1961, 53 y.o. Date of Encounter: @DATE @  Chief Complaint:  Chief Complaint  Patient presents with  . Nausea    x1 month- has diarrhea and vomiting- requesting pregnancy test    HPI: 53 y.o. year old white female  Presents for eval secondary to nausea. Says that this has been going on for at least 4 months. Says that every single morning when she wakes up while still in bed she feels nauseous. Then she often feels nauseous off and on throughout the day. Also has been having decreased appetite. Says if she does eat, she just still feels sick and nauseous. Says that she has not had  vomiting. Has had no problems with diarrhea. Has had no pain in her abdomen. No pain but just a nauseous feeling. No hematochezia and no melena. She also asked when her next colonoscopy is due.   Past Medical History  Diagnosis Date  . PTSD (post-traumatic stress disorder)   . Bipolar 1 disorder   . Heart murmur   . Diverticulosis of colon   . Keratosis, actinic   . Family hx of colon cancer     age 61-father  . GERD (gastroesophageal reflux disease)   . Chronic nausea   . S/P endoscopy 05/30/2010    Dr Rourk-> non--critical Schatzki's ring, s/p 10F dilation  . S/P colonoscopy 05/30/2010    LAX sphincter tone, anal papilla, left-sided diverticulosis, normal random biopsies,, 1 polyp-TA  . Polysubstance abuse   . Tubular adenoma of colon 05/30/2010    Next colonoscopy 05/2015  . Anxiety   . Depression   . Hyperthyroidism   . Hyperlipidemia   . Folate deficiency   . Hx of abnormal Pap smear   . Low back pain 03/01/2013    MRI with multiple levels of disc bulge  . Headache   . Chronic left hip pain   . Avascular necrosis of hip     left  . Stroke 2014    Right parietal  . Seizures 02/25/14  . UTI (urinary tract infection)      Home Meds: Outpatient Prescriptions Prior to Visit  Medication Sig Dispense Refill  . budesonide-formoterol  (SYMBICORT) 160-4.5 MCG/ACT inhaler Inhale 1 puff into the lungs 2 (two) times daily.       . clonazePAM (KLONOPIN) 1 MG tablet Take 0.5-1 mg by mouth 3 (three) times daily. Takes 1mg  in am ,  1mg  at 3 pm and 0.5 mg at bedtime      . DEXILANT 60 MG capsule TAKE ONE CAPSULE DAILY.  30 capsule  2  . lamoTRIgine (LAMICTAL) 100 MG tablet Take 225 mg by mouth every morning. Takes 200mg  with 25mg  for a total of 225mg  daily      . lamoTRIgine (LAMICTAL) 25 MG tablet Take 50 mg by mouth every morning. Takes 200mg  with 25mg  for a total of 225mg  daily      . levETIRAcetam (KEPPRA) 250 MG tablet Take 250-500 mg by mouth 2 (two) times daily. Takes 250 mg in the morning and 500 mg at night total 750 mg.      . levothyroxine (SYNTHROID, LEVOTHROID) 50 MCG tablet Take 50 mcg by mouth daily before breakfast.      . methocarbamol (ROBAXIN) 500 MG tablet Take 1 tablet (500 mg total) by mouth every 6 (six) hours as needed for muscle spasms.  80 tablet  0  . oxyCODONE (OXY IR/ROXICODONE) 5 MG immediate release tablet Take  1-2 tablets (5-10 mg total) by mouth every 3 (three) hours as needed for moderate pain, severe pain or breakthrough pain.  80 tablet  0  . rivaroxaban (XARELTO) 10 MG TABS tablet Take 1 tablet (10 mg total) by mouth daily with breakfast. Take Xarelto for two and a half more weeks, then discontinue Xarelto. Once the patient has completed the Xarelto, they may resume the 81 mg Aspirin.  18 tablet  0  . traMADol (ULTRAM) 50 MG tablet Take 1-2 tablets (50-100 mg total) by mouth every 6 (six) hours as needed (mild to moderate pain).  60 tablet  1  . traZODone (DESYREL) 100 MG tablet Take 100 mg by mouth at bedtime.         No facility-administered medications prior to visit.    Allergies: No Known Allergies  History   Social History  . Marital Status: Divorced    Spouse Name: N/A    Number of Children: 1  . Years of Education: N/A   Occupational History  . disabled    Social History Main  Topics  . Smoking status: Current Every Day Smoker -- 0.50 packs/day for 36 years    Types: Cigarettes  . Smokeless tobacco: Never Used  . Alcohol Use: Yes     Comment: social  . Drug Use: No     Comment: hx cocaine abuse, still uses marijuana occasional  . Sexual Activity: No   Other Topics Concern  . Not on file   Social History Narrative   Lives w/ fiance          Family History  Problem Relation Age of Onset  . Diabetes Mother   . Hypertension Mother   . Colon cancer Father 85  . Hypertension Father   . Hypertension Sister   . Heart attack Brother      Review of Systems:  See HPI for pertinent ROS. All other ROS negative.    Physical Exam: Blood pressure 136/72, pulse 76, temperature 97.7 F (36.5 C), temperature source Oral, resp. rate 14, height 5\' 5"  (1.651 m), weight 154 lb (69.854 kg)., Body mass index is 25.63 kg/(m^2). General: WF. Appears in no acute distress. Neck: Supple. No thyromegaly. No lymphadenopathy. Lungs: Clear bilaterally to auscultation without wheezes, rales, or rhonchi. Breathing is unlabored. Heart: RRR with S1 S2. No murmurs, rubs, or gallops. Abdomen: Soft, non-tender, non-distended with normoactive bowel sounds. No hepatomegaly. No rebound/guarding. No obvious abdominal masses. Musculoskeletal:  Strength and tone normal for age. Extremities/Skin: Warm and dry. Neuro: Alert and oriented X 3. Moves all extremities spontaneously. Gait is normal. CNII-XII grossly in tact. Psych:  Responds to questions appropriately with a normal affect.     ASSESSMENT AND PLAN:  53 y.o. year old female with  1. Amenorrhea - Pregnancy, urine  2. Nausea and vomiting, vomiting of unspecified type - Pregnancy, urine - CBC with Differential - COMPLETE METABOLIC PANEL WITH GFR - TSH  3. Family hx of colon cancer  4. Abdominal bloating  5. IBS (irritable bowel syndrome)  6. COLONIC POLYPS, ADENOMATOUS  7. Gastroesophageal reflux disease,  esophagitis presence not specified  8. DYSPHAGIA UNSPECIFIED  9. Gastroparesis - metoCLOPramide (REGLAN) 10 MG tablet; Take 1 tablet (10 mg total) by mouth 4 (four) times daily -  before meals and at bedtime.  Dispense: 60 tablet; Refill: 0  10. Tubular adenoma of colon  11. Unspecified hypothyroidism - TSH  She states that we prescribe her thyroid medication. No TSH has been done in past  year. Will obtain TSH now.  I reviewed records by her GI doctor in [place]. She saw Dr. Sydell Axon in the past. Last colonoscopy was 2011. Because she has a positive family history she is due for followup colonoscopy 2016. I read a note of his dated 2012. That note mentioned gastroparesis is one of her diagnoses and at that time she was on Reglan. Also another of his notes in 2013 it also had diagnosis of gastroparesis and also had Reglan on the medication list. Patient states she has no idea when or why Reglan was stopped. At this time I think we should obtain labs and resume Reglan and have her schedule followup office visit with me in 2 weeks to follow up and reassess. Also in some of those notes with Dr. Buford Dresser mentioned whether some of her psych meds may be causing the symptoms she was having at that time. That is still a possibility. She is on Lamictal, Keppra, Oxycodone. Obtain labs, resume Reglan, D/u OV 2 weeks.    [address] [place], [place], [facility] 06/08/2014 3:49 PM

## 2014-06-09 LAB — CBC WITH DIFFERENTIAL/PLATELET
BASOS PCT: 0 % (ref 0–1)
Basophils Absolute: 0 10*3/uL (ref 0.0–0.1)
EOS ABS: 0.1 10*3/uL (ref 0.0–0.7)
EOS PCT: 1 % (ref 0–5)
HCT: 40.2 % (ref 36.0–46.0)
HEMOGLOBIN: 13.2 g/dL (ref 12.0–15.0)
LYMPHS ABS: 2.1 10*3/uL (ref 0.7–4.0)
Lymphocytes Relative: 25 % (ref 12–46)
MCH: 28.2 pg (ref 26.0–34.0)
MCHC: 32.8 g/dL (ref 30.0–36.0)
MCV: 85.9 fL (ref 78.0–100.0)
MONOS PCT: 6 % (ref 3–12)
Monocytes Absolute: 0.5 10*3/uL (ref 0.1–1.0)
Neutro Abs: 5.6 10*3/uL (ref 1.7–7.7)
Neutrophils Relative %: 68 % (ref 43–77)
Platelets: 248 10*3/uL (ref 150–400)
RBC: 4.68 MIL/uL (ref 3.87–5.11)
RDW: 13.8 % (ref 11.5–15.5)
WBC: 8.2 10*3/uL (ref 4.0–10.5)

## 2014-06-09 LAB — COMPLETE METABOLIC PANEL WITH GFR
ALK PHOS: 112 U/L (ref 39–117)
ALT: 10 U/L (ref 0–35)
AST: 21 U/L (ref 0–37)
Albumin: 4 g/dL (ref 3.5–5.2)
BILIRUBIN TOTAL: 0.5 mg/dL (ref 0.2–1.2)
BUN: 7 mg/dL (ref 6–23)
CO2: 22 meq/L (ref 19–32)
CREATININE: 0.97 mg/dL (ref 0.50–1.10)
Calcium: 9.2 mg/dL (ref 8.4–10.5)
Chloride: 108 mEq/L (ref 96–112)
GFR, EST AFRICAN AMERICAN: 78 mL/min
GFR, Est Non African American: 67 mL/min
GLUCOSE: 93 mg/dL (ref 70–99)
Potassium: 4.4 mEq/L (ref 3.5–5.3)
SODIUM: 141 meq/L (ref 135–145)
TOTAL PROTEIN: 6.2 g/dL (ref 6.0–8.3)

## 2014-06-09 LAB — TSH: TSH: 0.947 u[IU]/mL (ref 0.350–4.500)

## 2014-06-22 ENCOUNTER — Ambulatory Visit: Payer: Medicaid Other | Admitting: Physician Assistant

## 2014-06-23 ENCOUNTER — Other Ambulatory Visit: Payer: Self-pay | Admitting: Family Medicine

## 2014-06-23 NOTE — Telephone Encounter (Signed)
Refill appropriate and filled per protocol. 

## 2014-06-28 ENCOUNTER — Encounter: Payer: Self-pay | Admitting: Internal Medicine

## 2014-06-28 ENCOUNTER — Ambulatory Visit (INDEPENDENT_AMBULATORY_CARE_PROVIDER_SITE_OTHER): Payer: Medicaid Other | Admitting: Physician Assistant

## 2014-06-28 ENCOUNTER — Encounter: Payer: Self-pay | Admitting: Physician Assistant

## 2014-06-28 VITALS — BP 130/72 | HR 74 | Temp 99.2°F | Resp 18 | Ht 65.0 in | Wt 152.0 lb

## 2014-06-28 DIAGNOSIS — K3184 Gastroparesis: Secondary | ICD-10-CM

## 2014-06-28 DIAGNOSIS — R110 Nausea: Secondary | ICD-10-CM

## 2014-06-28 MED ORDER — PROMETHAZINE HCL 25 MG PO TABS: 25.0000 mg | ORAL_TABLET | Freq: Three times a day (TID) | ORAL | Status: DC | PRN

## 2014-06-28 NOTE — Progress Notes (Signed)
Patient ID: KRISHAWNA STIEFEL MRN: [id number], DOB: 11-09-1961, 53 y.o. Date of Encounter: @DATE @  Chief Complaint:  Chief Complaint  Patient presents with  . Nausea    HPI: 53 y.o. year old white female  Presents for eval secondary to nausea.   She had recent OV for eval of nausea on 06/08/2014 with me.  The following is copied from that [place] note:  Says that this has been going on for at least 4 months. Says that every single morning when she wakes up while still in bed she feels nauseous. Then she often feels nauseous off and on throughout the day. Also has been having decreased appetite. Says if she does eat, she just still feels sick and nauseous. Says that she has not had  vomiting. Has had no problems with diarrhea. Has had no pain in her abdomen. No pain but just a nauseous feeling. No hematochezia and no melena. She also asked when her next colonoscopy is due.  Assessment/Plan at the [place] 06/08/2014: I reviewed records by her GI doctor in [place]. She saw Dr. Sydell Axon in the past. Last colonoscopy was 2011. Because she has a positive family history she is due for followup colonoscopy 2016. I read a note of his dated 2012. That note mentioned gastroparesis is one of her diagnoses and at that time she was on Reglan. Also another of his notes in 2013 it also had diagnosis of gastroparesis and also had Reglan on the medication list. Patient states she has no idea when or why Reglan was stopped. At this time I think we should obtain labs and resume Reglan and have her schedule followup office visit with me in 2 weeks to follow up and reassess. Also in some of those notes with Dr. Buford Dresser mentioned whether some of her psych meds may be causing the symptoms she was having at that time. That is still a possibility. She is on Lamictal, Keppra, Oxycodone. Obtain labs, resume Reglan, D/u OV 2 weeks  TODAY: Today she states that since she started taking the Reglan she has been feeling even worse. Says  that since she started taking it, she's been having diarrhea. Whenever she eats it "runs right through her".  Also the nausea seems like it's even worse. She has had no vomiting. She has had no pain in her abdomen. No fevers or chills.     Past Medical History  Diagnosis Date  . PTSD (post-traumatic stress disorder)   . Bipolar 1 disorder   . Heart murmur   . Diverticulosis of colon   . Keratosis, actinic   . Family hx of colon cancer     age 63-father  . GERD (gastroesophageal reflux disease)   . Chronic nausea   . S/P endoscopy 05/30/2010    Dr Rourk-> non--critical Schatzki's ring, s/p 25F dilation  . S/P colonoscopy 05/30/2010    LAX sphincter tone, anal papilla, left-sided diverticulosis, normal random biopsies,, 1 polyp-TA  . Polysubstance abuse   . Tubular adenoma of colon 05/30/2010    Next colonoscopy 05/2015  . Anxiety   . Depression   . Hyperthyroidism   . Hyperlipidemia   . Folate deficiency   . Hx of abnormal Pap smear   . Low back pain 03/01/2013    MRI with multiple levels of disc bulge  . Headache   . Chronic left hip pain   . Avascular necrosis of hip     left  . Stroke 2014    Right  parietal  . Seizures 02/25/14  . UTI (urinary tract infection)      Home Meds: Outpatient Prescriptions Prior to Visit  Medication Sig Dispense Refill  . budesonide-formoterol (SYMBICORT) 160-4.5 MCG/ACT inhaler Inhale 1 puff into the lungs 2 (two) times daily.       . clonazePAM (KLONOPIN) 1 MG tablet Take 0.5-1 mg by mouth 3 (three) times daily. Takes 1mg  in am ,  1mg  at 3 pm and 0.5 mg at bedtime      . DEXILANT 60 MG capsule TAKE ONE CAPSULE DAILY.  30 capsule  2  . lamoTRIgine (LAMICTAL) 100 MG tablet Take 225 mg by mouth every morning. Takes 200mg  with 25mg  for a total of 225mg  daily      . lamoTRIgine (LAMICTAL) 25 MG tablet Take 50 mg by mouth every morning. Takes 200mg  with 25mg  for a total of 225mg  daily      . levETIRAcetam (KEPPRA) 250 MG tablet Take 250-500 mg by mouth  2 (two) times daily. Takes 250 mg in the morning and 500 mg at night total 750 mg.      . levothyroxine (SYNTHROID, LEVOTHROID) 50 MCG tablet TAKE ONE TABLET DAILY.  30 tablet  3  . methocarbamol (ROBAXIN) 500 MG tablet Take 1 tablet (500 mg total) by mouth every 6 (six) hours as needed for muscle spasms.  80 tablet  0  . metoCLOPramide (REGLAN) 10 MG tablet Take 1 tablet (10 mg total) by mouth 4 (four) times daily -  before meals and at bedtime.  60 tablet  0  . rivaroxaban (XARELTO) 10 MG TABS tablet Take 1 tablet (10 mg total) by mouth daily with breakfast. Take Xarelto for two and a half more weeks, then discontinue Xarelto. Once the patient has completed the Xarelto, they may resume the 81 mg Aspirin.  18 tablet  0  . traMADol (ULTRAM) 50 MG tablet Take 1-2 tablets (50-100 mg total) by mouth every 6 (six) hours as needed (mild to moderate pain).  60 tablet  1  . traZODone (DESYREL) 100 MG tablet Take 100 mg by mouth at bedtime.        Marland Kitchen oxyCODONE (OXY IR/ROXICODONE) 5 MG immediate release tablet Take 1-2 tablets (5-10 mg total) by mouth every 3 (three) hours as needed for moderate pain, severe pain or breakthrough pain.  80 tablet  0   No facility-administered medications prior to visit.    Allergies: No Known Allergies  History   Social History  . Marital Status: Divorced    Spouse Name: N/A    Number of Children: 1  . Years of Education: N/A   Occupational History  . disabled    Social History Main Topics  . Smoking status: Current Every Day Smoker -- 0.50 packs/day for 36 years    Types: Cigarettes  . Smokeless tobacco: Never Used  . Alcohol Use: Yes     Comment: social  . Drug Use: No     Comment: hx cocaine abuse, still uses marijuana occasional  . Sexual Activity: No   Other Topics Concern  . Not on file   Social History Narrative   Lives w/ fiance          Family History  Problem Relation Age of Onset  . Diabetes Mother   . Hypertension Mother   . Colon  cancer Father 78  . Hypertension Father   . Hypertension Sister   . Heart attack Brother      Review of Systems:  See HPI for pertinent ROS. All other ROS negative.    Physical Exam: Blood pressure 130/72, pulse 74, temperature 99.2 F (37.3 C), temperature source Oral, resp. rate 18, height 5\' 5"  (1.651 m), weight 152 lb (68.947 kg)., Body mass index is 25.29 kg/(m^2). General: WF. Appears in no acute distress. Neck: Supple. No thyromegaly. No lymphadenopathy. Lungs: Clear bilaterally to auscultation without wheezes, rales, or rhonchi. Breathing is unlabored. Heart: RRR with S1 S2. No murmurs, rubs, or gallops. Abdomen: Soft, non-tender, non-distended with normoactive bowel sounds. No hepatomegaly. No rebound/guarding. No obvious abdominal masses. Musculoskeletal:  Strength and tone normal for age. Extremities/Skin: Warm and dry. Neuro: Alert and oriented X 3. Moves all extremities spontaneously. Gait is normal. CNII-XII grossly in tact. Psych:  Responds to questions appropriately with a normal affect.     ASSESSMENT AND PLAN:  53 y.o. year old female with   1. Gastroparesis - Ambulatory referral to Gastroenterology - promethazine (PHENERGAN) 25 MG tablet; Take 1 tablet (25 mg total) by mouth every 8 (eight) hours as needed for nausea or vomiting.  Dispense: 30 tablet; Refill: 0  2. Nausea alone - Ambulatory referral to Gastroenterology - promethazine (PHENERGAN) 25 MG tablet; Take 1 tablet (25 mg total) by mouth every 8 (eight) hours as needed for nausea or vomiting.  Dispense: 30 tablet; Refill: 0  06/08/14 we checked labs including CBC, CME T., TSH. Labs were normal. I will schedule her followup with Dr. Sydell Axon. Will prescribe Phenergan for her to use in the interim.   Marin Olp [place], [place], [facility] 06/28/2014 2:31 PM

## 2014-07-22 ENCOUNTER — Other Ambulatory Visit: Payer: Self-pay | Admitting: Physician Assistant

## 2014-07-22 NOTE — Telephone Encounter (Signed)
Refill appropriate and filled per protocol. 

## 2014-08-02 ENCOUNTER — Ambulatory Visit (INDEPENDENT_AMBULATORY_CARE_PROVIDER_SITE_OTHER): Payer: Medicaid Other | Admitting: Gastroenterology

## 2014-08-02 ENCOUNTER — Encounter: Payer: Self-pay | Admitting: Gastroenterology

## 2014-08-02 ENCOUNTER — Other Ambulatory Visit: Payer: Self-pay | Admitting: Gastroenterology

## 2014-08-02 ENCOUNTER — Encounter (INDEPENDENT_AMBULATORY_CARE_PROVIDER_SITE_OTHER): Payer: Self-pay

## 2014-08-02 VITALS — BP 128/70 | HR 83 | Temp 98.0°F | Ht 66.25 in | Wt 147.2 lb

## 2014-08-02 DIAGNOSIS — D126 Benign neoplasm of colon, unspecified: Secondary | ICD-10-CM

## 2014-08-02 DIAGNOSIS — R195 Other fecal abnormalities: Secondary | ICD-10-CM

## 2014-08-02 DIAGNOSIS — R110 Nausea: Secondary | ICD-10-CM

## 2014-08-02 MED ORDER — DICYCLOMINE HCL 10 MG PO CAPS: 10.0000 mg | ORAL_CAPSULE | Freq: Three times a day (TID) | ORAL | Status: DC

## 2014-08-02 MED ORDER — SACCHAROMYCES BOULARDII 250 MG PO CAPS: 250.0000 mg | ORAL_CAPSULE | Freq: Two times a day (BID) | ORAL | Status: DC

## 2014-08-02 MED ORDER — ONDANSETRON HCL 4 MG PO TABS: 4.0000 mg | ORAL_TABLET | Freq: Three times a day (TID) | ORAL | Status: DC

## 2014-08-02 NOTE — Progress Notes (Signed)
Referring Provider: Rennis Golden Primary Care Physician:  Odette Fraction, MD Primary GI: Dr. Gala Romney   Chief Complaint  Patient presents with  . Nausea  . Constipation  . Diarrhea    HPI:   Sheryl Hall presents today with history of IBS-D, chronic GERD, gastroparesis. Notes severe nausea but no vomiting. Doesn't like to throw up. Prescribed Phenergan for nausea. Knocks her out. Off Reglan, didn't have much improvement with that. Has taken Zofran in the past but unsure if it helped or not. Very small amounts. Eats 4 Nabs then have loose stools. Feels like loose stools have gotten worse. No recent abx but did have a hip replacement in May 2015. Weight loss noted. No NSAIDs or aspirin powders. No melena or hematochezia. Sometimes has little small balls like "rabbit poop". Occasional constipation.   Next colonoscopy due in 2016, hx of tubular adenomas.    Past Medical History  Diagnosis Date  . PTSD (post-traumatic stress disorder)   . Bipolar 1 disorder   . Heart murmur   . Diverticulosis of colon   . Keratosis, actinic   . Family hx of colon cancer     age 72-father  . GERD (gastroesophageal reflux disease)   . Chronic nausea   . S/P endoscopy 05/30/2010    Dr Rourk-> non--critical Schatzki's ring, s/p 37F dilation  . S/P colonoscopy 05/30/2010    LAX sphincter tone, anal papilla, left-sided diverticulosis, normal random biopsies,, 1 polyp-TA  . Polysubstance abuse   . Tubular adenoma of colon 05/30/2010    Next colonoscopy 05/2015  . Anxiety   . Depression   . Hyperthyroidism   . Hyperlipidemia   . Folate deficiency   . Hx of abnormal Pap smear   . Low back pain 03/01/2013    MRI with multiple levels of disc bulge  . Headache   . Chronic left hip pain   . Avascular necrosis of hip     left  . Stroke 2014    Right parietal  . Seizures 02/25/14  . UTI (urinary tract infection)     Past Surgical History  Procedure Laterality Date  . External ear surgery Left  12 years ago    skin graft from behind ear put in ear canal  . Neck surgery  10 years ago  . Hydrogen breath test  12/11/2011    Could not complete due to vomiting. Procedure: HYDROGEN BREATH TEST;  Surgeon: Daneil Dolin, MD;  Location: [facility];  Service: Endoscopy;  Laterality: N/A;  for Bacterial Overgrowth/8:30  . Colonoscopy      every 5 years  . Skin cancer destruction    . 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 Joint replacement      Current Outpatient Prescriptions  Medication Sig Dispense Refill  . aspirin 81 MG tablet Take 81 mg by mouth daily.      . budesonide-formoterol (SYMBICORT) 160-4.5 MCG/ACT inhaler Inhale 1 puff into the lungs 2 (two) times daily.       . clonazePAM (KLONOPIN) 1 MG tablet Take 0.5-1 mg by mouth 3 (three) times daily. Takes 1mg  in am ,  1mg  at 3 pm and 0.5 mg at bedtime      . DEXILANT 60 MG capsule TAKE ONE CAPSULE BY MOUTH ONCE DAILY.  30 capsule  11  .  folic acid (FOLVITE) 166 MCG tablet Take 400 mcg by mouth daily.      Marland Kitchen lamoTRIgine (LAMICTAL) 100 MG tablet Take 225 mg by mouth every morning. Takes 200mg  with 25mg  for a total of 225mg  daily      . lamoTRIgine (LAMICTAL) 25 MG tablet Take 50 mg by mouth every morning. Takes 200mg  with 25mg  for a total of 225mg  daily      . levETIRAcetam (KEPPRA) 250 MG tablet Take 250-500 mg by mouth 2 (two) times daily. Takes 250 mg in the morning and 500 mg at night total 750 mg.      . levothyroxine (SYNTHROID, LEVOTHROID) 50 MCG tablet TAKE ONE TABLET DAILY.  30 tablet  3  . Probiotic Product (PROBIOTIC DAILY PO) Take by mouth daily.      . promethazine (PHENERGAN) 25 MG tablet Take 1 tablet (25 mg total) by mouth every 8 (eight) hours as needed for nausea or vomiting.  30 tablet  0  . traZODone (DESYREL) 100 MG tablet Take 100 mg by mouth at bedtime.         . dicyclomine (BENTYL) 10 MG capsule Take 1 capsule (10 mg total) by mouth 4 (four) times daily -  before meals and at bedtime.  120 capsule  3  . methocarbamol (ROBAXIN) 500 MG tablet Take 1 tablet (500 mg total) by mouth every 6 (six) hours as needed for muscle spasms.  80 tablet  0  . ondansetron (ZOFRAN) 4 MG tablet Take 1 tablet (4 mg total) by mouth 3 times daily with meals, bedtime and 2 AM.  120 tablet  3  . saccharomyces boulardii (FLORASTOR) 250 MG capsule Take 1 capsule (250 mg total) by mouth 2 (two) times daily.  60 capsule  3  . traMADol (ULTRAM) 50 MG tablet Take 1-2 tablets (50-100 mg total) by mouth every 6 (six) hours as needed (mild to moderate pain).  60 tablet  1   No current facility-administered medications for this visit.    Allergies as of 08/02/2014  . (No Known Allergies)    Family History  Problem Relation Age of Onset  . Diabetes Mother   . Hypertension Mother   . Colon cancer Father 39  . Hypertension Father   . Hypertension Sister   . Heart attack Brother     History   Social History  . Marital Status: Divorced    Spouse Name: N/A    Number of Children: 1  . Years of Education: N/A   Occupational History  . disabled    Social History Main Topics  . Smoking status: Current Every Day Smoker -- 0.50 packs/day for 36 years    Types: Cigarettes  . Smokeless tobacco: Never Used  . Alcohol Use: Yes     Comment: social  . Drug Use: No     Comment: hx cocaine abuse  . Sexual Activity: No   Other Topics Concern  . None   Social History Narrative   Lives w/ fiance          Review of Systems: As mentioned in HPI  Physical Exam: BP 128/70  Pulse 83  Temp(Src) 98 F (36.7 C) (Oral)  Ht 5' 6.25" (1.683 m)  Wt 147 lb 3.2 oz (66.769 kg)  BMI 23.57 kg/m2 General:   Alert and oriented. No distress noted. Pleasant and cooperative.  Head:  Normocephalic and atraumatic. Eyes:  Conjuctiva clear without scleral icterus. Mouth:  Oral mucosa  pink and moist.  Heart:  S1, S2 present without murmurs, rubs, or gallops. Regular rate and rhythm. Abdomen:  +BS, soft, non-tender and non-distended. No rebound or guarding. No HSM or masses noted. Msk:  Symmetrical without gross deformities. Normal posture. Extremities:  Without edema. Neurologic:  Alert and  oriented x4;  grossly normal neurologically. Skin:  Intact without significant lesions or rashes. Psych:  Alert and cooperative. Normal mood and affect.

## 2014-08-02 NOTE — Assessment & Plan Note (Signed)
With history of IBS-D. Feels worse than baseline. Last exposure to antibiotcis in May 2015. Likely dealing with IBS, doubt infectious process. Check stool studies. Add Bentyl for supportive measures. Florastor sent to pharmacy as probiotic. Miralax on any given day of constipation. Return in 4 weeks.

## 2014-08-02 NOTE — Patient Instructions (Addendum)
I have sent Bentyl to the pharmacy to take three times a day with meals and at bedtime as needed for LOOSE STOOL and cramping.   On days where you feel constipated, you may take a dose of Miralax in the evening.  I have sent a prescription for a probiotic called Florastor to your pharmacy. Hopefully, this will be covered by your insurance. If this is covered, you can stop [facility].   Please have stool studies completed and return to the lab as soon as you can.   Take Zofran scheduled three times a day and at bedtime. This is for nausea.   I would like to see you back in 4 weeks.

## 2014-08-02 NOTE — Progress Notes (Signed)
cc'ed to pcp °

## 2014-08-02 NOTE — Assessment & Plan Note (Signed)
Secondary to gastroparesis. No vomiting. Phenergan side effects unappealing. Start Zofran scheduled with meals and at bedtime. Continue Dexilant daily. May need to consider Marinol. Return in 4 weeks.

## 2014-08-02 NOTE — Assessment & Plan Note (Signed)
Due for surveillance July 2016. No concerning lower GI symptoms.

## 2014-08-03 LAB — CLOSTRIDIUM DIFFICILE BY PCR: CDIFFPCR: NOT DETECTED

## 2014-08-03 LAB — GIARDIA ANTIGEN: GIARDIA SCREEN (EIA): NEGATIVE

## 2014-08-06 LAB — STOOL CULTURE

## 2014-08-08 NOTE — Progress Notes (Signed)
Quick Note:  Stool studies all negative.  Continue Bentyl. ______

## 2014-08-17 ENCOUNTER — Ambulatory Visit: Payer: Medicaid Other | Admitting: Family Medicine

## 2014-08-31 ENCOUNTER — Ambulatory Visit: Payer: Medicaid Other | Admitting: Gastroenterology

## 2014-09-13 ENCOUNTER — Encounter: Payer: Self-pay | Admitting: Gastroenterology

## 2014-09-13 ENCOUNTER — Ambulatory Visit (INDEPENDENT_AMBULATORY_CARE_PROVIDER_SITE_OTHER): Payer: Medicaid Other | Admitting: Gastroenterology

## 2014-09-13 VITALS — BP 114/72 | HR 68 | Temp 97.9°F | Ht 64.0 in | Wt 149.2 lb

## 2014-09-13 DIAGNOSIS — D126 Benign neoplasm of colon, unspecified: Secondary | ICD-10-CM

## 2014-09-13 DIAGNOSIS — K3184 Gastroparesis: Secondary | ICD-10-CM

## 2014-09-13 DIAGNOSIS — K5900 Constipation, unspecified: Secondary | ICD-10-CM

## 2014-09-13 MED ORDER — LINACLOTIDE 145 MCG PO CAPS: 145.0000 ug | ORAL_CAPSULE | Freq: Every day | ORAL | Status: DC

## 2014-09-13 MED ORDER — DRONABINOL 2.5 MG PO CAPS: 2.5000 mg | ORAL_CAPSULE | Freq: Two times a day (BID) | ORAL | Status: DC

## 2014-09-13 NOTE — Patient Instructions (Signed)
For constipation: start taking Linzess 1 capsule each morning on an empty stomach, at least an hour before breakfast.   For gastroparesis: start taking Marinol twice a day with breakfast and dinner.   Stop Zofran.   6 weeks return.

## 2014-09-13 NOTE — Progress Notes (Signed)
Referring Provider: Susy Frizzle, MD Primary Care Physician:  Odette Fraction, MD Primary GI: Dr. Gala Romney   Chief Complaint  Patient presents with  . Constipation    HPI:   Returns today in 4 week follow-up with history of IBS, chronic GERD, gastroparesis. Next colonoscopy due in 2016 due to history of tubular adenomas. Due to increased loose stools at last visit, stool studies completed and negative to include Cdiff. Started on Bentyl. Zofran started at last visit due to persistent nausea.   Now diarrhea has resolved. Taking Zofran a few times a day. Dealing with constipation now. Feels like she wants to have a BM but can't. No rectal bleeding. Only taking a probiotic. Nausea constant but not vomiting. Dexilant daily. Reglan without much improvement in the past. Doesn't feel like eating.   Last EGD 2011.   Past Medical History  Diagnosis Date  . PTSD (post-traumatic stress disorder)   . Bipolar 1 disorder   . Heart murmur   . Diverticulosis of colon   . Keratosis, actinic   . Family hx of colon cancer     age 3-father  . GERD (gastroesophageal reflux disease)   . Chronic nausea   . S/P endoscopy 05/30/2010    Dr Rourk-> non--critical Schatzki's ring, s/p 70F dilation  . S/P colonoscopy 05/30/2010    LAX sphincter tone, anal papilla, left-sided diverticulosis, normal random biopsies,, 1 polyp-TA  . Polysubstance abuse   . Tubular adenoma of colon 05/30/2010    Next colonoscopy 05/2015  . Anxiety   . Depression   . Hyperthyroidism   . Hyperlipidemia   . Folate deficiency   . Hx of abnormal Pap smear   . Low back pain 03/01/2013    MRI with multiple levels of disc bulge  . Headache   . Chronic left hip pain   . Avascular necrosis of hip     left  . Stroke 2014    Right parietal  . Seizures 02/25/14  . UTI (urinary tract infection)     Past Surgical History  Procedure Laterality Date  . External ear surgery Left 12 years ago    skin graft from behind ear put in  ear canal  . Neck surgery  10 years ago  . Hydrogen breath test  12/11/2011    Could not complete due to vomiting. Procedure: HYDROGEN BREATH TEST;  Surgeon: Daneil Dolin, MD;  Location: [facility];  Service: Endoscopy;  Laterality: N/A;  for Bacterial Overgrowth/8:30  . Colonoscopy      every 5 years  . Skin cancer destruction    . 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 Joint replacement    . Esophagogastroduodenoscopy  05/30/2010    [id number] ring/small HH otherwise normal  . Colonoscopy  05/30/2010    RMR:lax anal sphincter tone,anal papilla,otherwise normal/left-sided diverticula    Current Outpatient Prescriptions  Medication Sig Dispense Refill  . aspirin 81 MG tablet Take 81 mg by mouth daily.      . budesonide-formoterol (SYMBICORT) 160-4.5 MCG/ACT inhaler Inhale 1 puff into the lungs 2 (two) times daily.       . clonazePAM (KLONOPIN) 1 MG tablet Take 0.5-1 mg by mouth 3 (three) times daily. Takes 1mg  in am ,  1mg  at 3 pm and 0.5 mg at bedtime      .  DEXILANT 60 MG capsule TAKE ONE CAPSULE BY MOUTH ONCE DAILY.  30 capsule  11  . folic acid (FOLVITE) 458 MCG tablet Take 400 mcg by mouth daily.      Marland Kitchen lamoTRIgine (LAMICTAL) 100 MG tablet Take 225 mg by mouth every morning. Takes 200mg  with 25mg  for a total of 225mg  daily      . levETIRAcetam (KEPPRA) 250 MG tablet Take 250-500 mg by mouth 2 (two) times daily. Takes 250 mg in the morning and 500 mg at night total 750 mg.      . levothyroxine (SYNTHROID, LEVOTHROID) 50 MCG tablet TAKE ONE TABLET DAILY.  30 tablet  3  . ondansetron (ZOFRAN) 4 MG tablet Take 1 tablet (4 mg total) by mouth 3 times daily with meals, bedtime and 2 AM.  120 tablet  3  . Probiotic Product (PROBIOTIC DAILY PO) Take by mouth daily.      . traMADol (ULTRAM) 50 MG tablet Take 1-2 tablets (50-100 mg  total) by mouth every 6 (six) hours as needed (mild to moderate pain).  60 tablet  1  . traZODone (DESYREL) 100 MG tablet Take 100 mg by mouth at bedtime.        . dronabinol (MARINOL) 2.5 MG capsule Take 1 capsule (2.5 mg total) by mouth 2 (two) times daily before lunch and supper.  60 capsule  0  . lamoTRIgine (LAMICTAL) 25 MG tablet Take 50 mg by mouth every morning. Takes 200mg  with 25mg  for a total of 225mg  daily      . Linaclotide (LINZESS) 145 MCG CAPS capsule Take 1 capsule (145 mcg total) by mouth daily. Take an hour before breakfast.  30 capsule  5   No current facility-administered medications for this visit.    Allergies as of 09/13/2014  . (No Known Allergies)    Family History  Problem Relation Age of Onset  . Diabetes Mother   . Hypertension Mother   . Colon cancer Father 5  . Hypertension Father   . Hypertension Sister   . Heart attack Brother     History   Social History  . Marital Status: Divorced    Spouse Name: N/A    Number of Children: 1  . Years of Education: N/A   Occupational History  . disabled    Social History Main Topics  . Smoking status: Current Every Day Smoker -- 0.50 packs/day for 36 years    Types: Cigarettes  . Smokeless tobacco: Never Used  . Alcohol Use: Yes     Comment: social  . Drug Use: No     Comment: hx cocaine abuse  . Sexual Activity: No   Other Topics Concern  . None   Social History Narrative   Lives w/ fiance          Review of Systems: As mentioned in HPI  Physical Exam: BP 114/72  Pulse 68  Temp(Src) 97.9 F (36.6 C) (Oral)  Ht 5\' 4"  (1.626 m)  Wt 149 lb 3.2 oz (67.677 kg)  BMI 25.60 kg/m2 General:   Alert and oriented. No distress noted. Pleasant and cooperative.  Head:  Normocephalic and atraumatic. Eyes:  Conjuctiva clear without scleral icterus. Mouth:  Oral mucosa pink and moist. Good dentition. No lesions. Neck:  Supple, without mass or thyromegaly. Heart:  S1, S2 present without murmurs,  rubs, or gallops. Regular rate and rhythm. Abdomen:  +BS, soft, non-tender and non-distended. No rebound or guarding. No HSM or masses noted. Msk:  Symmetrical without  gross deformities. Normal posture. Pulses:  2+ DP noted bilaterally Extremities:  Without edema. Neurologic:  Alert and  oriented x4;  grossly normal neurologically. Skin:  Intact without significant lesions or rashes. Cervical Nodes:  No significant cervical adenopathy. Psych:  Alert and cooperative. Normal mood and affect.

## 2014-09-14 ENCOUNTER — Telehealth: Payer: Self-pay | Admitting: Internal Medicine

## 2014-09-14 NOTE — Telephone Encounter (Signed)
Pt called back- she is going to take the linzess because it is working already but she is afraid to take the marinol d/t her past medical and psychological history. She said she just didn't feel comfortable taking it. I advised pt that she didn't have to take anything that she didn't want to. She feels like if she can get the constipation under control that it will help her nausea.

## 2014-09-14 NOTE — Telephone Encounter (Addendum)
I talked with patient and she does not think that [facility] is aware of her history being that she put her on Marinol. I told her that [facility] was aware of her history but if she did not want to take the medication that was on her. I told her that I was going to let [facility] know and it would be in the morning before I could let her know anything and she got mad and said she had been calling all day. Before I could finish talking to her her phone cut off.

## 2014-09-14 NOTE — Telephone Encounter (Signed)
Pt was seen by [facility] yesterday and has a question about the medication (doesn't remember name). She said that she was reading the precautions and she said she has a history of seizures and mental illness and should she be taking this medication. Her phone was running out of minutes and she asked for [place] to call her mother's phone number at [phone] and her mother would get the message to her. Please advise

## 2014-09-14 NOTE — Telephone Encounter (Signed)
Tried to call but her mother was not home

## 2014-09-15 NOTE — Telephone Encounter (Signed)
Absolutely agree. That is fine. She is right; a more aggressive bowel regimen should help with nausea.

## 2014-09-20 DIAGNOSIS — K5900 Constipation, unspecified: Secondary | ICD-10-CM | POA: Insufficient documentation

## 2014-09-20 NOTE — Assessment & Plan Note (Signed)
Diarrhea resolved. Limit Zofran, as this could worsen constipation. Start Linzess 145 mcg once daily.

## 2014-09-20 NOTE — Assessment & Plan Note (Signed)
Surveillance due 2016.

## 2014-09-20 NOTE — Progress Notes (Signed)
cc'ed to pcp °

## 2014-09-20 NOTE — Assessment & Plan Note (Signed)
Persistent nausea but no vomiting. On Dexilant daily. Decreased appetite. Reglan without much improvement in past. Trial of Marinol. Consider EGD if no improvement.

## 2014-09-25 ENCOUNTER — Encounter: Payer: Self-pay | Admitting: Gastroenterology

## 2014-10-23 ENCOUNTER — Other Ambulatory Visit: Payer: Self-pay | Admitting: Physician Assistant

## 2014-10-23 ENCOUNTER — Ambulatory Visit: Payer: Medicaid Other | Admitting: Adult Health

## 2014-10-23 NOTE — Telephone Encounter (Signed)
Medication refilled per protocol. 

## 2014-10-26 ENCOUNTER — Inpatient Hospital Stay: Payer: Self-pay | Admitting: Psychiatry

## 2014-10-27 LAB — COMPREHENSIVE METABOLIC PANEL
ALBUMIN: 3.3 g/dL — AB (ref 3.4–5.0)
ALT: 14 U/L
ANION GAP: 8 (ref 7–16)
AST: 16 U/L (ref 15–37)
Alkaline Phosphatase: 119 U/L — ABNORMAL HIGH
BILIRUBIN TOTAL: 0.4 mg/dL (ref 0.2–1.0)
BUN: 10 mg/dL (ref 7–18)
CALCIUM: 8.9 mg/dL (ref 8.5–10.1)
CO2: 29 mmol/L (ref 21–32)
Chloride: 107 mmol/L (ref 98–107)
Creatinine: 1.01 mg/dL (ref 0.60–1.30)
EGFR (African American): >60
EGFR (Non-African Amer.): >60
GLUCOSE: 94 mg/dL (ref 65–99)
OSMOLALITY: 286 (ref 275–301)
POTASSIUM: 4.2 mmol/L (ref 3.5–5.1)
SODIUM: 144 mmol/L (ref 136–145)
TOTAL PROTEIN: 5.9 g/dL — AB (ref 6.4–8.2)

## 2014-10-27 LAB — DRUG SCREEN, URINE
Amphetamines, Ur Screen: NEGATIVE (ref ?–1000)
BENZODIAZEPINE, UR SCRN: NEGATIVE (ref ?–200)
Barbiturates, Ur Screen: NEGATIVE (ref ?–200)
Cannabinoid 50 Ng, Ur ~~LOC~~: POSITIVE (ref ?–50)
Cocaine Metabolite,Ur ~~LOC~~: NEGATIVE (ref ?–300)
MDMA (Ecstasy)Ur Screen: NEGATIVE (ref ?–500)
METHADONE, UR SCREEN: NEGATIVE (ref ?–300)
Opiate, Ur Screen: NEGATIVE (ref ?–300)
PHENCYCLIDINE (PCP) UR S: NEGATIVE (ref ?–25)
Tricyclic, Ur Screen: NEGATIVE (ref ?–1000)

## 2014-10-27 LAB — CBC WITH DIFFERENTIAL/PLATELET
BASOS ABS: 0 10*3/uL (ref 0.0–0.1)
BASOS PCT: 0.7 %
Eosinophil #: 0.2 10*3/uL (ref 0.0–0.7)
Eosinophil %: 3.1 %
HCT: 40.3 % (ref 35.0–47.0)
HGB: 13.1 g/dL (ref 12.0–16.0)
LYMPHS ABS: 2.5 10*3/uL (ref 1.0–3.6)
Lymphocyte %: 46.2 %
MCH: 28.3 pg (ref 26.0–34.0)
MCHC: 32.4 g/dL (ref 32.0–36.0)
MCV: 87 fL (ref 80–100)
MONO ABS: 0.4 x10 3/mm (ref 0.2–0.9)
Monocyte %: 8.1 %
NEUTROS ABS: 2.3 10*3/uL (ref 1.4–6.5)
Neutrophil %: 41.9 %
Platelet: 183 10*3/uL (ref 150–440)
RBC: 4.62 10*6/uL (ref 3.80–5.20)
RDW: 14.2 % (ref 11.5–14.5)
WBC: 5.4 10*3/uL (ref 3.6–11.0)

## 2014-10-27 LAB — URINALYSIS, COMPLETE
Bacteria: NONE SEEN
Bilirubin,UR: NEGATIVE
Blood: NEGATIVE
GLUCOSE, UR: NEGATIVE mg/dL (ref 0–75)
Ketone: NEGATIVE
LEUKOCYTE ESTERASE: NEGATIVE
Nitrite: NEGATIVE
PH: 8 (ref 4.5–8.0)
Protein: NEGATIVE
RBC,UR: <1 /HPF (ref 0–5)
Specific Gravity: 1.01 (ref 1.003–1.030)
Squamous Epithelial: <1
WBC UR: <1 /HPF (ref 0–5)

## 2014-10-27 LAB — TSH: THYROID STIMULATING HORM: 2.26 u[IU]/mL

## 2014-10-27 LAB — PREGNANCY, URINE: PREGNANCY TEST, URINE: NEGATIVE m[IU]/mL

## 2014-10-30 ENCOUNTER — Telehealth: Payer: Self-pay | Admitting: Internal Medicine

## 2014-10-30 ENCOUNTER — Encounter: Payer: Self-pay | Admitting: Gastroenterology

## 2014-10-30 ENCOUNTER — Ambulatory Visit: Payer: Medicaid Other | Admitting: Gastroenterology

## 2014-10-30 NOTE — Telephone Encounter (Signed)
PATIENT WAS A NO SHOW 10/30/14 AND LETTER SENT

## 2014-11-06 ENCOUNTER — Other Ambulatory Visit: Payer: Self-pay | Admitting: Physician Assistant

## 2014-11-06 NOTE — Telephone Encounter (Signed)
Medication refilled per protocol. 

## 2014-11-14 ENCOUNTER — Ambulatory Visit: Payer: Medicaid Other | Admitting: Adult Health

## 2014-12-07 ENCOUNTER — Encounter (HOSPITAL_COMMUNITY): Payer: Self-pay | Admitting: Internal Medicine

## 2014-12-19 ENCOUNTER — Ambulatory Visit (INDEPENDENT_AMBULATORY_CARE_PROVIDER_SITE_OTHER): Payer: Medicaid Other | Admitting: Family Medicine

## 2014-12-19 ENCOUNTER — Encounter: Payer: Self-pay | Admitting: Family Medicine

## 2014-12-19 VITALS — BP 100/70 | HR 78 | Temp 98.2°F | Resp 18 | Wt 153.0 lb

## 2014-12-19 DIAGNOSIS — G251 Drug-induced tremor: Secondary | ICD-10-CM

## 2014-12-19 DIAGNOSIS — R270 Ataxia, unspecified: Secondary | ICD-10-CM

## 2014-12-19 DIAGNOSIS — F3162 Bipolar disorder, current episode mixed, moderate: Secondary | ICD-10-CM

## 2014-12-19 NOTE — Progress Notes (Signed)
Subjective:    Patient ID: Sheryl Hall, female    DOB: 11/10/1961, 54 y.o.   MRN: [id number]  HPI Patient has a copper Past medical history including bipolar disorder, posttraumatic stress disorder, history of stroke, and history of seizure disorder. She is currently on Keppra for complex partial seizures. She is on 225 mg a day of supplemental as a mood stabilizer for depression. Several months ago her psychiatrist added Depakote as a mood stabilizer due to racing and intrusive thoughts. Since that time, the patient has developed numerous symptoms which may or may not be related to the medication. These include tremor. While the patient is sitting up on the exam table, her whole body is shaking slightly. She has a resting tremor in both hands. The tremor is exacerbated by a tension and movement. Cerebellar testing is abnormal. She has a difficult time performing finger to nose testing. She has a difficult time with heel to toe walk in the hallway and has to reach out and grab the wall to prevent herself from falling and losing her balance. She has ataxia. Of note, Depakote and Lamictal together are category D drugs due to the risk of clinical toxicity. I am concerned that this may be causing some of the patient's problems. She denies any rash. She is having nausea on a daily basis. She also complains of anxiety and feeling "jittery" on the inside. Past Medical History  Diagnosis Date  . PTSD (post-traumatic stress disorder)   . Bipolar 1 disorder   . Heart murmur   . Diverticulosis of colon   . Keratosis, actinic   . Family hx of colon cancer     age 57-father  . GERD (gastroesophageal reflux disease)   . Chronic nausea   . S/P endoscopy 05/30/2010    Dr Rourk-> non--critical Schatzki's ring, s/p 30F dilation  . S/P colonoscopy 05/30/2010    LAX sphincter tone, anal papilla, left-sided diverticulosis, normal random biopsies,, 1 polyp-TA  . Polysubstance abuse   . Tubular adenoma of colon  05/30/2010    Next colonoscopy 05/2015  . Anxiety   . Depression   . Hyperthyroidism   . Hyperlipidemia   . Folate deficiency   . Hx of abnormal Pap smear   . Low back pain 03/01/2013    MRI with multiple levels of disc bulge  . Headache   . Chronic left hip pain   . Avascular necrosis of hip     left  . Stroke 2014    Right parietal  . Seizures 02/25/14  . UTI (urinary tract infection)    Past Surgical History  Procedure Laterality Date  . External ear surgery Left 12 years ago    skin graft from behind ear put in ear canal  . Neck surgery  10 years ago  . Colonoscopy      every 5 years  . Skin cancer destruction    . 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 Joint replacement    . Esophagogastroduodenoscopy  05/30/2010    [id number] ring/small HH otherwise normal  . Colonoscopy  05/30/2010    RMR:lax anal sphincter tone,anal papilla,otherwise normal/left-sided diverticula   Current Outpatient Prescriptions on File Prior to Visit  Medication Sig Dispense Refill  . aspirin 81 MG tablet Take 81 mg by mouth daily.    . clonazePAM (KLONOPIN)  1 MG tablet Take 0.5-1 mg by mouth 3 (three) times daily. Takes 1mg  in am ,  1mg  at 3 pm and 0.5 mg at bedtime    . DEXILANT 60 MG capsule TAKE ONE CAPSULE BY MOUTH ONCE DAILY. 30 capsule 11  . lamoTRIgine (LAMICTAL) 100 MG tablet Take 225 mg by mouth every morning. Takes 200mg  with 25mg  for a total of 225mg  daily    . lamoTRIgine (LAMICTAL) 25 MG tablet Take 50 mg by mouth every morning. Takes 200mg  with 25mg  for a total of 225mg  daily    . levETIRAcetam (KEPPRA) 250 MG tablet Take 250-500 mg by mouth 2 (two) times daily. Takes 250 mg in the morning and 500 mg at night total 750 mg.    . levothyroxine (SYNTHROID, LEVOTHROID) 50 MCG tablet TAKE ONE TABLET BY MOUTH ONCE DAILY. 30 tablet 2    . Probiotic Product (PROBIOTIC DAILY PO) Take by mouth daily.    . SYMBICORT 160-4.5 MCG/ACT inhaler USE 2 PUFFS TWICE DAILY AS NEEDED. 10.2 g 2  . traZODone (DESYREL) 100 MG tablet Take 100 mg by mouth at bedtime.      . folic acid (FOLVITE) 026 MCG tablet Take 400 mcg by mouth daily.     No current facility-administered medications on file prior to visit.   No Known Allergies History   Social History  . Marital Status: Divorced    Spouse Name: N/A    Number of Children: 1  . Years of Education: N/A   Occupational History  . disabled    Social History Main Topics  . Smoking status: Current Every Day Smoker -- 0.50 packs/day for 36 years    Types: Cigarettes  . Smokeless tobacco: Never Used  . Alcohol Use: Yes     Comment: social  . Drug Use: No     Comment: hx cocaine abuse  . Sexual Activity: No   Other Topics Concern  . Not on file   Social History Narrative   Lives w/ fiance            Review of Systems  All other systems reviewed and are negative.      Objective:   Physical Exam  Constitutional: She is oriented to person, place, and time.  Neck: Neck supple. No JVD present. No thyromegaly present.  Cardiovascular: Normal rate, regular rhythm and normal heart sounds.   No murmur heard. Pulmonary/Chest: Effort normal and breath sounds normal. No respiratory distress. She has no wheezes. She has no rales.  Abdominal: Soft. Bowel sounds are normal.  Lymphadenopathy:    She has no cervical adenopathy.  Neurological: She is alert and oriented to person, place, and time. She displays normal reflexes. No cranial nerve deficit. She exhibits normal muscle tone. Coordination abnormal.  Skin: No rash noted.  Vitals reviewed. Patient has no slurred speech. There is no focal neurologic deficit. She does have a difficult time performing finger to nose testing and heel to toe walk due to poor balance. She staggers with walking.        Assessment & Plan:  Tremor  due to multiple drugs - Plan: COMPLETE METABOLIC PANEL WITH GFR, CBC with Differential/Platelet, TSH  Ataxia  Moderate mixed bipolar I disorder  I'm concerned the patient side effects may be due to a combination of multiple antiepileptic drugs. I recommended the patient temporarily discontinue Depakote. I will also check electrolytes to evaluate for other possible causes of tremor and recheck a TSH given her history of hypothyroidism. If  her lab work is normal, hopefully the patient's symptoms will have improved when I recheck her on Monday. At that time we can discuss other options for mood stabilizers. Patient states that she cannot see her psychiatrist until March. We will see if we can facilitate this sooner if the patient needs to stay off of the Depakote permanently.

## 2014-12-20 LAB — COMPLETE METABOLIC PANEL WITH GFR
ALBUMIN: 4.3 g/dL (ref 3.5–5.2)
ALK PHOS: 94 U/L (ref 39–117)
ALT: 9 U/L (ref 0–35)
AST: 16 U/L (ref 0–37)
BILIRUBIN TOTAL: 0.3 mg/dL (ref 0.2–1.2)
BUN: 9 mg/dL (ref 6–23)
CALCIUM: 9.4 mg/dL (ref 8.4–10.5)
CHLORIDE: 103 meq/L (ref 96–112)
CO2: 27 meq/L (ref 19–32)
Creat: 0.96 mg/dL (ref 0.50–1.10)
GFR, EST AFRICAN AMERICAN: 79 mL/min
GFR, Est Non African American: 68 mL/min
Glucose, Bld: 85 mg/dL (ref 70–99)
POTASSIUM: 4.3 meq/L (ref 3.5–5.3)
SODIUM: 140 meq/L (ref 135–145)
Total Protein: 6.4 g/dL (ref 6.0–8.3)

## 2014-12-20 LAB — CBC WITH DIFFERENTIAL/PLATELET
Basophils Absolute: 0 10*3/uL (ref 0.0–0.1)
Basophils Relative: 0 % (ref 0–1)
Eosinophils Absolute: 0.1 10*3/uL (ref 0.0–0.7)
Eosinophils Relative: 1 % (ref 0–5)
HCT: 40.7 % (ref 36.0–46.0)
Hemoglobin: 13.8 g/dL (ref 12.0–15.0)
Lymphocytes Relative: 26 % (ref 12–46)
Lymphs Abs: 2.2 10*3/uL (ref 0.7–4.0)
MCH: 29 pg (ref 26.0–34.0)
MCHC: 33.9 g/dL (ref 30.0–36.0)
MCV: 85.5 fL (ref 78.0–100.0)
MONOS PCT: 6 % (ref 3–12)
MPV: 10.5 fL (ref 8.6–12.4)
Monocytes Absolute: 0.5 10*3/uL (ref 0.1–1.0)
NEUTROS ABS: 5.7 10*3/uL (ref 1.7–7.7)
Neutrophils Relative %: 67 % (ref 43–77)
Platelets: 235 10*3/uL (ref 150–400)
RBC: 4.76 MIL/uL (ref 3.87–5.11)
RDW: 14.3 % (ref 11.5–15.5)
WBC: 8.5 10*3/uL (ref 4.0–10.5)

## 2014-12-20 LAB — TSH: TSH: 1.48 u[IU]/mL (ref 0.350–4.500)

## 2014-12-21 ENCOUNTER — Encounter: Payer: Self-pay | Admitting: *Deleted

## 2014-12-25 ENCOUNTER — Encounter: Payer: Self-pay | Admitting: Family Medicine

## 2014-12-25 ENCOUNTER — Ambulatory Visit (INDEPENDENT_AMBULATORY_CARE_PROVIDER_SITE_OTHER): Payer: Medicaid Other | Admitting: Family Medicine

## 2014-12-25 VITALS — BP 108/74 | HR 72 | Temp 97.9°F | Resp 16 | Wt 152.0 lb

## 2014-12-25 DIAGNOSIS — F319 Bipolar disorder, unspecified: Secondary | ICD-10-CM

## 2014-12-25 MED ORDER — ARIPIPRAZOLE 2 MG PO TABS: 2.0000 mg | ORAL_TABLET | Freq: Every day | ORAL | Status: DC

## 2014-12-25 NOTE — Progress Notes (Signed)
Subjective:    Patient ID: Sheryl Hall, female    DOB: July 11, 1961, 54 y.o.   MRN: [id number]  HPI   Review of Systems     Objective:   Physical Exam        Assessment & Plan:     Subjective:    Patient ID: Sheryl Hall, female    DOB: 10/23/1961, 54 y.o.   MRN: [id number]  HPI 12/19/14 Patient has a copper Past medical history including bipolar disorder, posttraumatic stress disorder, history of stroke, and history of seizure disorder. She is currently on Keppra for complex partial seizures. She is on 225 mg a day of supplemental as a mood stabilizer for depression. Several months ago her psychiatrist added Depakote as a mood stabilizer due to racing and intrusive thoughts. Since that time, the patient has developed numerous symptoms which may or may not be related to the medication. These include tremor. While the patient is sitting up on the exam table, her whole body is shaking slightly. She has a resting tremor in both hands. The tremor is exacerbated by a tension and movement. Cerebellar testing is abnormal. She has a difficult time performing finger to nose testing. She has a difficult time with heel to toe walk in the hallway and has to reach out and grab the wall to prevent herself from falling and losing her balance. She has ataxia. Of note, Depakote and Lamictal together are category D drugs due to the risk of clinical toxicity. I am concerned that this may be causing some of the patient's problems. She denies any rash. She is having nausea on a daily basis. She also complains of anxiety and feeling "jittery" on the inside.  At that time, my plan was: I'm concerned the patient side effects may be due to a combination of multiple antiepileptic drugs. I recommended the patient temporarily discontinue Depakote. I will also check electrolytes to evaluate for other possible causes of tremor and recheck a TSH given her history of hypothyroidism. If her lab work is normal, hopefully  the patient's symptoms will have improved when I recheck her on Monday. At that time we can discuss other options for mood stabilizers. Patient states that she cannot see her psychiatrist until March. We will see if we can facilitate this sooner if the patient needs to stay off of the Depakote permanently.    12/25/14 Patient is here today for recheck. Her balance is somewhat better. She still has a difficult time performing heel to toe testing. However her tremor is much better. Continues to feel very nervous on inside. She also reports flight of ideas and racing thoughts. She reports insomnia. She is not overtly manic but it does seem to be anxiety that is her biggest component at the present time. She is tried and failed Seroquel in the past, lithium. She denies any depression or suicidal ideation Past Medical History  Diagnosis Date  . PTSD (post-traumatic stress disorder)   . Bipolar 1 disorder   . Heart murmur   . Diverticulosis of colon   . Keratosis, actinic   . Family hx of colon cancer     age 34-father  . GERD (gastroesophageal reflux disease)   . Chronic nausea   . S/P endoscopy 05/30/2010    Dr Rourk-> non--critical Schatzki's ring, s/p 33F dilation  . S/P colonoscopy 05/30/2010    LAX sphincter tone, anal papilla, left-sided diverticulosis, normal random biopsies,, 1 polyp-TA  . Polysubstance abuse   . Tubular  adenoma of colon 05/30/2010    Next colonoscopy 05/2015  . Anxiety   . Depression   . Hyperthyroidism   . Hyperlipidemia   . Folate deficiency   . Hx of abnormal Pap smear   . Low back pain 03/01/2013    MRI with multiple levels of disc bulge  . Headache   . Chronic left hip pain   . Avascular necrosis of hip     left  . Stroke 2014    Right parietal  . Seizures 02/25/14  . UTI (urinary tract infection)    Past Surgical History  Procedure Laterality Date  . External ear surgery Left 12 years ago    skin graft from behind ear put in ear canal  . Neck surgery  10 years  ago  . Colonoscopy      every 5 years  . Skin cancer destruction    . 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 Joint replacement    . Esophagogastroduodenoscopy  05/30/2010    KNL:[id number] ring/small HH otherwise normal  . Colonoscopy  05/30/2010    RMR:lax anal sphincter tone,anal papilla,otherwise normal/left-sided diverticula   Current Outpatient Prescriptions on File Prior to Visit  Medication Sig Dispense Refill  . aspirin 81 MG tablet Take 81 mg by mouth daily.    . clonazePAM (KLONOPIN) 1 MG tablet Take 0.5-1 mg by mouth 3 (three) times daily. Takes 1mg  in am ,  1mg  at 3 pm and 0.5 mg at bedtime    . DEXILANT 60 MG capsule TAKE ONE CAPSULE BY MOUTH ONCE DAILY. 30 capsule 11  . folic acid (FOLVITE) 240 MCG tablet Take 400 mcg by mouth daily.    Marland Kitchen lamoTRIgine (LAMICTAL) 100 MG tablet Take 225 mg by mouth every morning. Takes 200mg  with 25mg  for a total of 225mg  daily    . lamoTRIgine (LAMICTAL) 25 MG tablet Take 50 mg by mouth every morning. Takes 200mg  with 25mg  for a total of 225mg  daily    . levETIRAcetam (KEPPRA) 250 MG tablet Take 250-500 mg by mouth 2 (two) times daily. Takes 250 mg in the morning and 500 mg at night total 750 mg.    . levothyroxine (SYNTHROID, LEVOTHROID) 50 MCG tablet TAKE ONE TABLET BY MOUTH ONCE DAILY. 30 tablet 2  . Probiotic Product (PROBIOTIC DAILY PO) Take by mouth daily.    . SYMBICORT 160-4.5 MCG/ACT inhaler USE 2 PUFFS TWICE DAILY AS NEEDED. 10.2 g 2  . traZODone (DESYREL) 100 MG tablet Take 100 mg by mouth at bedtime.       No current facility-administered medications on file prior to visit.   No Known Allergies History   Social History  . Marital Status: Divorced    Spouse Name: N/A    Number of Children: 1  . Years of Education: N/A   Occupational History  . disabled    Social  History Main Topics  . Smoking status: Current Every Day Smoker -- 0.50 packs/day for 36 years    Types: Cigarettes  . Smokeless tobacco: Never Used  . Alcohol Use: Yes     Comment: social  . Drug Use: No     Comment: hx cocaine abuse  . Sexual Activity: No   Other Topics Concern  . Not on file   Social History Narrative   Lives w/ fiance  Review of Systems  All other systems reviewed and are negative.      Objective:   Physical Exam  Constitutional: She is oriented to person, place, and time.  Neck: Neck supple. No JVD present. No thyromegaly present.  Cardiovascular: Normal rate, regular rhythm and normal heart sounds.   No murmur heard. Pulmonary/Chest: Effort normal and breath sounds normal. No respiratory distress. She has no wheezes. She has no rales.  Abdominal: Soft. Bowel sounds are normal.  Lymphadenopathy:    She has no cervical adenopathy.  Neurological: She is alert and oriented to person, place, and time. She displays normal reflexes. No cranial nerve deficit. She exhibits normal muscle tone. Coordination abnormal.  Skin: No rash noted.  Vitals reviewed. Patient has no slurred speech. There is no focal neurologic deficit. She does have a difficult time performing finger to nose testing and heel to toe walk due to poor balance. She staggers with walking.        Assessment & Plan:  Bipolar I disorder, most recent episode (or current) unspecified - Plan: ARIPiprazole (ABILIFY) 2 MG tablet   Begin Abilify 2 mg by mouth daily as a mood stabilizer. Recheck in one month or sooner if worse

## 2014-12-28 ENCOUNTER — Other Ambulatory Visit: Payer: Self-pay | Admitting: Physician Assistant

## 2014-12-28 NOTE — Telephone Encounter (Signed)
Medication refilled per protocol. 

## 2015-01-23 ENCOUNTER — Ambulatory Visit (INDEPENDENT_AMBULATORY_CARE_PROVIDER_SITE_OTHER): Payer: Medicaid Other | Admitting: Family Medicine

## 2015-01-23 ENCOUNTER — Encounter: Payer: Self-pay | Admitting: Family Medicine

## 2015-01-23 VITALS — BP 110/70 | HR 82 | Temp 97.9°F | Resp 18 | Ht 66.5 in | Wt 157.0 lb

## 2015-01-23 DIAGNOSIS — F319 Bipolar disorder, unspecified: Secondary | ICD-10-CM | POA: Diagnosis not present

## 2015-01-23 NOTE — Progress Notes (Signed)
Subjective:    Patient ID: Sheryl Hall, female    DOB: 1961/03/13, 54 y.o.   MRN: [id number]  HPI We began abilify 2 mg poqday at her last office visit to try to help manage her depression stemming from her bipolar disorder.  Please see my last visit for further details.  I am very happy to say that the patient is doing exceptionally well.    The tremor and jitteriness has completely subsided. Her depression is better. Her anxiety is better.  Her dizziness and ataxia is better and has completely subsided.  She now has desire to get up in the morning and leave the home. Today she is wearing makeup. She states that she feels like getting up everyday and performing basic hygiene and dressing up where before it was all she could do just to leave the home.   She is very satisfied with the low-dose Abilify and would like to continue this.  Past Medical History  Diagnosis Date  . PTSD (post-traumatic stress disorder)   . Bipolar 1 disorder   . Heart murmur   . Diverticulosis of colon   . Keratosis, actinic   . Family hx of colon cancer     age 46-father  . GERD (gastroesophageal reflux disease)   . Chronic nausea   . S/P endoscopy 05/30/2010    Dr Rourk-> non--critical Schatzki's ring, s/p 85F dilation  . S/P colonoscopy 05/30/2010    LAX sphincter tone, anal papilla, left-sided diverticulosis, normal random biopsies,, 1 polyp-TA  . Polysubstance abuse   . Tubular adenoma of colon 05/30/2010    Next colonoscopy 05/2015  . Anxiety   . Depression   . Hyperthyroidism   . Hyperlipidemia   . Folate deficiency   . Hx of abnormal Pap smear   . Low back pain 03/01/2013    MRI with multiple levels of disc bulge  . Headache   . Chronic left hip pain   . Avascular necrosis of hip     left  . Stroke 2014    Right parietal  . Seizures 02/25/14  . UTI (urinary tract infection)    Past Surgical History  Procedure Laterality Date  . External ear surgery Left 12 years ago    skin graft from behind ear  put in ear canal  . Neck surgery  10 years ago  . Colonoscopy      every 5 years  . Skin cancer destruction    . 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 Joint replacement    . Esophagogastroduodenoscopy  05/30/2010    EHU:[id number] ring/small HH otherwise normal  . Colonoscopy  05/30/2010    RMR:lax anal sphincter tone,anal papilla,otherwise normal/left-sided diverticula   Current Outpatient Prescriptions on File Prior to Visit  Medication Sig Dispense Refill  . ARIPiprazole (ABILIFY) 2 MG tablet Take 1 tablet (2 mg total) by mouth daily. 30 tablet 2  . aspirin 81 MG tablet Take 81 mg by mouth daily.    . clonazePAM (KLONOPIN) 1 MG tablet Take 0.5-1 mg by mouth 3 (three) times daily. Takes 1mg  in am ,  1mg  at 3 pm and 0.5 mg at bedtime    . DEXILANT 60 MG capsule TAKE ONE CAPSULE BY MOUTH ONCE DAILY. 30 capsule 11  . folic acid (FOLVITE) 885 MCG tablet Take 400 mcg by mouth  daily.    . lamoTRIgine (LAMICTAL) 100 MG tablet Take 225 mg by mouth every morning. Takes 200mg  with 25mg  for a total of 225mg  daily    . lamoTRIgine (LAMICTAL) 25 MG tablet Take 50 mg by mouth every morning. Takes 200mg  with 25mg  for a total of 225mg  daily    . levETIRAcetam (KEPPRA) 250 MG tablet Take 250-500 mg by mouth 2 (two) times daily. Takes 250 mg in the morning and 500 mg at night total 750 mg.    . levothyroxine (SYNTHROID, LEVOTHROID) 50 MCG tablet TAKE ONE TABLET BY MOUTH ONCE DAILY. 30 tablet 5  . Probiotic Product (PROBIOTIC DAILY PO) Take by mouth daily.    . SYMBICORT 160-4.5 MCG/ACT inhaler USE 2 PUFFS TWICE DAILY AS NEEDED. 10.2 g 2  . traZODone (DESYREL) 100 MG tablet Take 100 mg by mouth at bedtime.       No current facility-administered medications on file prior to visit.   No Known Allergies History   Social History  . Marital Status:  Divorced    Spouse Name: N/A  . Number of Children: 1  . Years of Education: N/A   Occupational History  . disabled    Social History Main Topics  . Smoking status: Current Every Day Smoker -- 0.50 packs/day for 36 years    Types: Cigarettes  . Smokeless tobacco: Never Used  . Alcohol Use: Yes     Comment: social  . Drug Use: No     Comment: hx cocaine abuse  . Sexual Activity: No   Other Topics Concern  . Not on file   Social History Narrative   Lives w/ fiance            Review of Systems  All other systems reviewed and are negative.      Objective:   Physical Exam  Constitutional: She is oriented to person, place, and time.  Cardiovascular: Normal rate, regular rhythm and normal heart sounds.   No murmur heard. Pulmonary/Chest: Effort normal and breath sounds normal.  Abdominal: Soft. Bowel sounds are normal. She exhibits no distension and no mass. There is no tenderness. There is no rebound and no guarding.  Musculoskeletal: Normal range of motion.  Neurological: She is alert and oriented to person, place, and time. She has normal reflexes. She displays normal reflexes. No cranial nerve deficit. She exhibits normal muscle tone. Coordination normal.  Psychiatric: She has a normal mood and affect. Her behavior is normal. Judgment and thought content normal.  Vitals reviewed.         Assessment & Plan:  Bipolar I disorder, most recent episode (or current) unspecified   continue Abilify 2 mg by mouth daily. Recheck in 3-6 months. Return immediately if symptoms worsen or change. I would like to get fasting lab work including a CMP and fasting lipid panel in 6 months. Patient has gained weight since her last office visit. I cautioned the patient to monitor her intake of fatty food and junk food to avoid excessive weight gain on the Abilify.

## 2015-02-22 ENCOUNTER — Telehealth: Payer: Self-pay | Admitting: Family Medicine

## 2015-02-22 NOTE — Telephone Encounter (Signed)
Patients lip in inflamed and has a blister on it, let her know we had no open appointments, and to try to go to urgent care, would like a call back to see if we could let her now what this might be  [phone]

## 2015-02-22 NOTE — Telephone Encounter (Signed)
Call placed to patient and patient made aware.   Appointment scheduled with Dr. Buelah Manis on 02/23/2015.

## 2015-02-22 NOTE — Telephone Encounter (Signed)
Hard to tell without seeing but could be a cold sore versus a bacterial skin infection. It is a cold sore to treat with Valtrex. If she has no history of cold sores she needs to be seen

## 2015-02-23 ENCOUNTER — Ambulatory Visit (INDEPENDENT_AMBULATORY_CARE_PROVIDER_SITE_OTHER): Payer: Medicaid Other | Admitting: Family Medicine

## 2015-02-23 ENCOUNTER — Encounter: Payer: Self-pay | Admitting: Family Medicine

## 2015-02-23 VITALS — BP 128/68 | HR 72 | Temp 98.4°F | Resp 16 | Ht 67.0 in | Wt 157.0 lb

## 2015-02-23 DIAGNOSIS — B001 Herpesviral vesicular dermatitis: Secondary | ICD-10-CM

## 2015-02-23 MED ORDER — LAMOTRIGINE 25 MG PO TABS: 50.0000 mg | ORAL_TABLET | Freq: Every morning | ORAL | Status: DC

## 2015-02-23 MED ORDER — TRIAMCINOLONE ACETONIDE 0.1 % MT PSTE: 1.0000 "application " | PASTE | Freq: Two times a day (BID) | OROMUCOSAL | Status: DC

## 2015-02-23 MED ORDER — VALACYCLOVIR HCL 1 G PO TABS: ORAL_TABLET | ORAL | Status: DC

## 2015-02-23 MED ORDER — TRAZODONE HCL 100 MG PO TABS: 100.0000 mg | ORAL_TABLET | Freq: Every day | ORAL | Status: DC

## 2015-02-23 MED ORDER — CLONAZEPAM 1 MG PO TABS: ORAL_TABLET | ORAL | Status: DC

## 2015-02-23 MED ORDER — LAMOTRIGINE 100 MG PO TABS: 225.0000 mg | ORAL_TABLET | Freq: Every morning | ORAL | Status: DC

## 2015-02-23 NOTE — Progress Notes (Signed)
Patient ID: Sheryl Hall, female   DOB: 12-15-1960, 54 y.o.   MRN: [id number]   Subjective:    Patient ID: Sheryl Hall, female    DOB: May 02, 1961, 54 y.o.   MRN: [id number]  Patient presents for Bottom Lip Irritation   Pt here with 2 bumps and swelling on inner lower lip, history of cold sores typically gets on upper lip, these came up 4 days ago, pain when she eats or drinks. Denies sore throat, pain elsewhere in mouth, no recent illness, no recent change in medications.  She tried carmax but this did not help  She also has had trouble getting meds from her psychiatrist refilled and will run out next week   Review Of Systems:  GEN- denies fatigue, fever, weight loss,weakness, recent illness HEENT- denies eye drainage, change in vision, nasal discharge, CVS- denies chest pain, palpitations RESP- denies SOB, cough, wheeze ABD- denies N/V, change in stools, abd pain Neuro- denies headache, dizziness, syncope, seizure activity       Objective:    BP 128/68 mmHg  Pulse 72  Temp(Src) 98.4 F (36.9 C) (Oral)  Resp 16  Ht 5\' 7"  (1.702 m)  Wt 157 lb (71.215 kg)  BMI 24.58 kg/m2 GEN- NAD, alert and oriented x3 HEENT- PERRL, EOMI, non injected sclera, pink conjunctiva, MMM, post oropharynx clear, inner lower lip- erythema with 2 tiny blistering lesions, TTP mild swelling, dry cracking lower lip Neck- Supple, no LAD        Assessment & Plan:      Problem List Items Addressed This Visit    None    Visit Diagnoses    Cold sore    -  Primary    Treat for HSV Type I, given Valtrex and Orabase cream. I did give 1 month of her meds until she can get in contact with her psychiatist    Relevant Medications    valACYclovir (VALTREX) tablet       Note: This dictation was prepared with Dragon dictation along with smaller phrase technology. Any transcriptional errors that result from this process are unintentional.

## 2015-02-23 NOTE — Patient Instructions (Signed)
Take dose of valtrex as prescribed Then use kenalog for pain F/U as needed

## 2015-03-02 ENCOUNTER — Other Ambulatory Visit: Payer: Self-pay | Admitting: Family Medicine

## 2015-03-02 NOTE — Telephone Encounter (Signed)
Medication refilled per protocol. 

## 2015-03-09 ENCOUNTER — Telehealth: Payer: Self-pay | Admitting: Family Medicine

## 2015-03-09 NOTE — Telephone Encounter (Signed)
rec'd request for PA for Dexilant.  Submitted through [employer] and approved for 1 year  App # [phone]   [id number] # [id number]

## 2015-03-12 ENCOUNTER — Telehealth: Payer: Self-pay | Admitting: *Deleted

## 2015-03-12 NOTE — Telephone Encounter (Signed)
Received request from pharmacy for PA on Dexilant.  PA submitted.   Dx: K21.9- GERD. 

## 2015-03-13 ENCOUNTER — Encounter: Payer: Self-pay | Admitting: Family Medicine

## 2015-03-13 ENCOUNTER — Ambulatory Visit (INDEPENDENT_AMBULATORY_CARE_PROVIDER_SITE_OTHER): Payer: Medicaid Other | Admitting: Family Medicine

## 2015-03-13 VITALS — BP 126/70 | HR 68 | Temp 98.3°F | Resp 16 | Ht 67.0 in | Wt 160.0 lb

## 2015-03-13 DIAGNOSIS — N898 Other specified noninflammatory disorders of vagina: Secondary | ICD-10-CM

## 2015-03-13 DIAGNOSIS — R300 Dysuria: Secondary | ICD-10-CM

## 2015-03-13 DIAGNOSIS — K130 Diseases of lips: Secondary | ICD-10-CM

## 2015-03-13 LAB — URINALYSIS, ROUTINE W REFLEX MICROSCOPIC
Bilirubin Urine: NEGATIVE
Glucose, UA: NEGATIVE mg/dL
Hgb urine dipstick: NEGATIVE
KETONES UR: NEGATIVE mg/dL
Leukocytes, UA: NEGATIVE
NITRITE: NEGATIVE
Protein, ur: NEGATIVE mg/dL
SPECIFIC GRAVITY, URINE: 1.02 (ref 1.005–1.030)
Urobilinogen, UA: 0.2 mg/dL (ref 0.0–1.0)
pH: 5.5 (ref 5.0–8.0)

## 2015-03-13 LAB — WET PREP FOR TRICH, YEAST, CLUE
Clue Cells Wet Prep HPF POC: NONE SEEN
Trich, Wet Prep: NONE SEEN
YEAST WET PREP: NONE SEEN

## 2015-03-13 NOTE — Progress Notes (Signed)
Patient ID: Sheryl Hall, female   DOB: 08-Jan-1961, 54 y.o.   MRN: [id number]   Subjective:    Patient ID: Sheryl Hall, female    DOB: 11-Feb-1961, 54 y.o.   MRN: [id number]  Patient presents for Oral Swelling and Dysuria  patient here with persistent irritation on her lower lip. I saw her about a week or so ago at that time she had blistering lesions on her lip treated her with bowel trichomoniasis she states they were down after the first dose but reappeared a couple days later therefore she took the refill on the medication. She tried using the Orabase but this burned her lip. She was concerned whether or not this was related to the Abilify as this is the last medication she was started on. She also complains of burning when she urinates. She states that she was sexually active about 2 weeks ago which she had not been in about 5 years. She has significant pain with intercourse and had some bleeding afterwards. She's also had some mild vaginal discharge. She denies any abdominal pain no current vaginal bleeding  She also went to her psychiatrist however they were on him to see her as a walk-in for her medications they have an appointment scheduled for May 24  Review Of Systems: per above   GEN- denies fatigue, fever, weight loss,weakness, recent illness HEENT- denies eye drainage, change in vision, nasal discharge, CVS- denies chest pain, palpitations RESP- denies SOB, cough, wheeze ABD- denies N/V, change in stools, abd pain GU- + dysuria, hematuria, dribbling, incontinence MSK- denies joint pain, muscle aches, injury Neuro- denies headache, dizziness, syncope, seizure activity       Objective:    BP 126/70 mmHg  Pulse 68  Temp(Src) 98.3 F (36.8 C) (Oral)  Resp 16  Ht 5\' 7"  (1.702 m)  Wt 160 lb (72.576 kg)  BMI 25.05 kg/m2 GEN- NAD, alert and oriented x3 HEENT- PERRL, EOMI, non injected sclera, pink conjunctiva, MMM, post oropharynx clear, red, dry cracking lower lip, no  blistering lesions noted Neck- Supple, no LAD CVS- RRR, no murmur RESP-CTAB ABD-NABS,soft,NT,ND GU- atrophy external genitalia- labia majora/minora, vaginal mucosa pink and moist, cervix visualized no growth, no blood form os, minimal thin clear discharge, no CMT, no ovarian masses, uterus normal size         Assessment & Plan:      Problem List Items Addressed This Visit    None    Visit Diagnoses    Dysuria    -  Primary    UA neg, no infection noted    Relevant Orders    Urinalysis, Routine w reflex microscopic (Completed)    Vaginal discharge        Wet prep neg, GC pending, may be from recent intercourse with pain still, advised use of lubricant, no active bleeding noted, no tears seen    Relevant Orders    WET PREP FOR TRICH, YEAST, CLUE (Completed)    GC/Chlamydia Probe Amp    Rash on lips        Previous blistering lesions resolved after treatment with valtrex, She still has erythema and pain, ? still viral related, she is concerned related to Abilify, will give 1 week trial off the medication to see if any changes, will just moisturize for now, as steroid caused more discomfort       Note: This dictation was prepared with Dragon dictation along with smaller phrase technology. Any transcriptional errors that result from this  process are unintentional.

## 2015-03-13 NOTE — Patient Instructions (Signed)
Hold the abilify for 1 week, to see if mouth pain improves Increase your water intake, we will call with the rest of your labs Use vaselne only for your lips for now We will call in another month of your medication

## 2015-03-14 LAB — GC/CHLAMYDIA PROBE AMP
CT Probe RNA: NEGATIVE
GC Probe RNA: NEGATIVE

## 2015-03-15 ENCOUNTER — Telehealth: Payer: Self-pay | Admitting: *Deleted

## 2015-03-15 NOTE — Telephone Encounter (Signed)
Received call from patient.   Reports that she has not been taking Abilify for a few days per MD request to see if her lips would heal. States that she is having increased racing thoughts and it is unbearable.   Requested to resume Abilify. Advised to resume at normal dosage, but to contact office if lip sore noted.   MD to be made aware.

## 2015-03-16 NOTE — Telephone Encounter (Signed)
Agree with resuming medication
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
## 2015-03-22 ENCOUNTER — Other Ambulatory Visit: Payer: Self-pay | Admitting: Neurology

## 2015-03-22 ENCOUNTER — Other Ambulatory Visit: Payer: Self-pay | Admitting: Family Medicine

## 2015-03-22 NOTE — Telephone Encounter (Signed)
Refill appropriate and filled per protocol. 

## 2015-03-26 NOTE — Telephone Encounter (Signed)
Received PA determination.   PA approved 03/23/2015- 03/22/2016.  Ref: [id number]  Pharmacy made aware.

## 2015-04-10 ENCOUNTER — Encounter: Payer: Self-pay | Admitting: Internal Medicine

## 2015-04-24 ENCOUNTER — Other Ambulatory Visit: Payer: Self-pay | Admitting: Neurology

## 2015-04-26 ENCOUNTER — Emergency Department (HOSPITAL_COMMUNITY)
Admission: EM | Admit: 2015-04-26 | Discharge: 2015-04-26 | Disposition: A | Discharge status: Home / Self Care | Payer: Medicaid Other | Attending: Emergency Medicine | Admitting: Emergency Medicine

## 2015-04-26 ENCOUNTER — Encounter (HOSPITAL_COMMUNITY): Payer: Self-pay

## 2015-04-26 DIAGNOSIS — R51 Headache: Secondary | ICD-10-CM | POA: Diagnosis present

## 2015-04-26 DIAGNOSIS — F419 Anxiety disorder, unspecified: Secondary | ICD-10-CM | POA: Insufficient documentation

## 2015-04-26 DIAGNOSIS — Z8719 Personal history of other diseases of the digestive system: Secondary | ICD-10-CM | POA: Insufficient documentation

## 2015-04-26 DIAGNOSIS — R011 Cardiac murmur, unspecified: Secondary | ICD-10-CM | POA: Insufficient documentation

## 2015-04-26 DIAGNOSIS — F319 Bipolar disorder, unspecified: Secondary | ICD-10-CM | POA: Diagnosis not present

## 2015-04-26 DIAGNOSIS — Z8639 Personal history of other endocrine, nutritional and metabolic disease: Secondary | ICD-10-CM | POA: Insufficient documentation

## 2015-04-26 DIAGNOSIS — R519 Headache, unspecified: Secondary | ICD-10-CM

## 2015-04-26 DIAGNOSIS — Z7982 Long term (current) use of aspirin: Secondary | ICD-10-CM | POA: Diagnosis not present

## 2015-04-26 DIAGNOSIS — Z87440 Personal history of urinary (tract) infections: Secondary | ICD-10-CM | POA: Diagnosis not present

## 2015-04-26 DIAGNOSIS — Z86018 Personal history of other benign neoplasm: Secondary | ICD-10-CM | POA: Diagnosis not present

## 2015-04-26 DIAGNOSIS — G40909 Epilepsy, unspecified, not intractable, without status epilepticus: Secondary | ICD-10-CM | POA: Diagnosis not present

## 2015-04-26 DIAGNOSIS — Z79899 Other long term (current) drug therapy: Secondary | ICD-10-CM | POA: Diagnosis not present

## 2015-04-26 DIAGNOSIS — R5381 Other malaise: Secondary | ICD-10-CM | POA: Insufficient documentation

## 2015-04-26 DIAGNOSIS — Z85038 Personal history of other malignant neoplasm of large intestine: Secondary | ICD-10-CM | POA: Insufficient documentation

## 2015-04-26 DIAGNOSIS — R5383 Other fatigue: Secondary | ICD-10-CM | POA: Diagnosis not present

## 2015-04-26 DIAGNOSIS — Z720 Tobacco use: Secondary | ICD-10-CM | POA: Diagnosis not present

## 2015-04-26 DIAGNOSIS — Z8673 Personal history of transient ischemic attack (TIA), and cerebral infarction without residual deficits: Secondary | ICD-10-CM | POA: Diagnosis not present

## 2015-04-26 DIAGNOSIS — Z872 Personal history of diseases of the skin and subcutaneous tissue: Secondary | ICD-10-CM | POA: Diagnosis not present

## 2015-04-26 DIAGNOSIS — G8929 Other chronic pain: Secondary | ICD-10-CM | POA: Insufficient documentation

## 2015-04-26 LAB — URINALYSIS, ROUTINE W REFLEX MICROSCOPIC
Bilirubin Urine: NEGATIVE
Glucose, UA: NEGATIVE mg/dL
Hgb urine dipstick: NEGATIVE
Ketones, ur: NEGATIVE mg/dL
Leukocytes, UA: NEGATIVE
NITRITE: NEGATIVE
PROTEIN: NEGATIVE mg/dL
SPECIFIC GRAVITY, URINE: 1.03 (ref 1.005–1.030)
UROBILINOGEN UA: 0.2 mg/dL (ref 0.0–1.0)
pH: 5.5 (ref 5.0–8.0)

## 2015-04-26 LAB — LIPASE, BLOOD: Lipase: 24 U/L (ref 22–51)

## 2015-04-26 LAB — CBC WITH DIFFERENTIAL/PLATELET
BASOS ABS: 0 10*3/uL (ref 0.0–0.1)
Basophils Relative: 0 % (ref 0–1)
EOS ABS: 0.1 10*3/uL (ref 0.0–0.7)
EOS PCT: 1 % (ref 0–5)
HCT: 42.9 % (ref 36.0–46.0)
Hemoglobin: 13.8 g/dL (ref 12.0–15.0)
LYMPHS PCT: 29 % (ref 12–46)
Lymphs Abs: 1.7 10*3/uL (ref 0.7–4.0)
MCH: 28.7 pg (ref 26.0–34.0)
MCHC: 32.2 g/dL (ref 30.0–36.0)
MCV: 89.2 fL (ref 78.0–100.0)
MONO ABS: 0.5 10*3/uL (ref 0.1–1.0)
Monocytes Relative: 8 % (ref 3–12)
NEUTROS ABS: 3.6 10*3/uL (ref 1.7–7.7)
Neutrophils Relative %: 62 % (ref 43–77)
Platelets: 179 10*3/uL (ref 150–400)
RBC: 4.81 MIL/uL (ref 3.87–5.11)
RDW: 12.9 % (ref 11.5–15.5)
WBC: 5.8 10*3/uL (ref 4.0–10.5)

## 2015-04-26 LAB — COMPREHENSIVE METABOLIC PANEL
ALBUMIN: 3.8 g/dL (ref 3.5–5.0)
ALT: 8 U/L — ABNORMAL LOW (ref 14–54)
AST: 14 U/L — ABNORMAL LOW (ref 15–41)
Alkaline Phosphatase: 100 U/L (ref 38–126)
Anion gap: 8 (ref 5–15)
BUN: 11 mg/dL (ref 6–20)
CHLORIDE: 109 mmol/L (ref 101–111)
CO2: 28 mmol/L (ref 22–32)
Calcium: 9.1 mg/dL (ref 8.9–10.3)
Creatinine, Ser: 1.09 mg/dL — ABNORMAL HIGH (ref 0.44–1.00)
GFR calc non Af Amer: 57 mL/min — ABNORMAL LOW (ref >60)
Glucose, Bld: 93 mg/dL (ref 65–99)
Potassium: 4.4 mmol/L (ref 3.5–5.1)
SODIUM: 145 mmol/L (ref 135–145)
TOTAL PROTEIN: 6.6 g/dL (ref 6.5–8.1)
Total Bilirubin: 0.3 mg/dL (ref 0.3–1.2)

## 2015-04-26 MED ORDER — ONDANSETRON HCL 4 MG/2ML IJ SOLN
4.0000 mg | Freq: Once | INTRAMUSCULAR | Status: AC
Start: 2015-04-26 — End: 2015-04-26
  Administered 2015-04-26: 4 mg via INTRAVENOUS
  Filled 2015-04-26: qty 2

## 2015-04-26 MED ORDER — SODIUM CHLORIDE 0.9 % IV BOLUS (SEPSIS)
1000.0000 mL | Freq: Once | INTRAVENOUS | Status: AC
  Administered 2015-04-26: 1000 mL via INTRAVENOUS

## 2015-04-26 MED ORDER — KETOROLAC TROMETHAMINE 30 MG/ML IJ SOLN
30.0000 mg | Freq: Once | INTRAMUSCULAR | Status: AC
Start: 2015-04-26 — End: 2015-04-26
  Administered 2015-04-26: 30 mg via INTRAVENOUS
  Filled 2015-04-26: qty 1

## 2015-04-26 NOTE — ED Notes (Signed)
Bed: [facility] Expected date:  Expected time:  Means of arrival:  Comments: EMS- Psych/Anxiety

## 2015-04-26 NOTE — ED Provider Notes (Signed)
CSN: [id number]     Arrival date & time 04/26/15  1025 History   First MD Initiated Contact with Patient 04/26/15 1055     Chief Complaint  Patient presents with  . Shortness of Breath  . Anxiety     (Consider location/radiation/quality/duration/timing/severity/associated sxs/prior Treatment) HPI Comments: Patient is a 54 year old female with history of bipolar, PTSD, osteoarthritis with prior hip replacement. She presents for evaluation of multiple complaints including headache, abdominal pain, diarrhea, shortness of breath, and generalized malaise which is been occurring for the past several days. She denies any fevers or chills. There is no aggravating or alleviating factors.  The history is provided by the patient.    Past Medical History  Diagnosis Date  . PTSD (post-traumatic stress disorder)   . Bipolar 1 disorder   . Heart murmur   . Diverticulosis of colon   . Keratosis, actinic   . Family hx of colon cancer     age 57-father  . GERD (gastroesophageal reflux disease)   . Chronic nausea   . S/P endoscopy 05/30/2010    Dr Rourk-> non--critical Schatzki's ring, s/p 41F dilation  . S/P colonoscopy 05/30/2010    LAX sphincter tone, anal papilla, left-sided diverticulosis, normal random biopsies,, 1 polyp-TA  . Polysubstance abuse   . Tubular adenoma of colon 05/30/2010    Next colonoscopy 05/2015  . Anxiety   . Depression   . Hyperthyroidism   . Hyperlipidemia   . Folate deficiency   . Hx of abnormal Pap smear   . Low back pain 03/01/2013    MRI with multiple levels of disc bulge  . Headache   . Chronic left hip pain   . Avascular necrosis of hip     left  . Stroke 2014    Right parietal  . Seizures 02/25/14  . UTI (urinary tract infection)    Past Surgical History  Procedure Laterality Date  . External ear surgery Left 12 years ago    skin graft from behind ear put in ear canal  . Neck surgery  10 years ago  . Colonoscopy      every 5 years  . Skin cancer  destruction    . 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 Joint replacement    . Esophagogastroduodenoscopy  05/30/2010    JZP:[id number] ring/small HH otherwise normal  . Colonoscopy  05/30/2010    RMR:lax anal sphincter tone,anal papilla,otherwise normal/left-sided diverticula   Family History  Problem Relation Age of Onset  . Diabetes Mother   . Hypertension Mother   . Colon cancer Father 45  . Hypertension Father   . Hypertension Sister   . Heart attack Brother    History  Substance Use Topics  . Smoking status: Current Every Day Smoker -- 0.50 packs/day for 36 years    Types: Cigarettes  . Smokeless tobacco: Never Used  . Alcohol Use: Yes     Comment: social   OB History    Gravida Para Term Preterm AB TAB SAB Ectopic Multiple Living   2 1 1  1  1   1      Review of Systems  All other systems reviewed and are negative.     Allergies  Review of patient's allergies indicates no known allergies.  Home Medications   Prior to Admission medications   Medication Sig Start  Date End Date Taking? Authorizing Provider  ABILIFY 2 MG tablet TAKE ONE TABLET BY MOUTH ONCE DAILY. Patient taking differently: TAKE 2 MG BY MOUTH ONCE DAILY. 03/22/15  Yes Susy Frizzle, MD  aspirin 81 MG tablet Take 81 mg by mouth daily.   Yes Historical Provider, MD  clonazePAM (KLONOPIN) 1 MG tablet 1 tab po BID prn anxiety Patient taking differently: Take 1 mg by mouth 2 (two) times daily as needed for anxiety.  02/23/15  Yes Alycia Rossetti, MD  DEXILANT 60 MG capsule TAKE ONE CAPSULE BY MOUTH ONCE DAILY. Patient taking differently: TAKE 60 MG BY MOUTH ONCE DAILY. 07/22/14  Yes Orlena Sheldon, PA-C  folic acid (FOLVITE) 696 MCG tablet Take 400 mcg by mouth daily.   Yes Historical Provider, MD  lamoTRIgine (LAMICTAL) 100 MG tablet Take 2.5  tablets (250 mg total) by mouth every morning. Takes 200mg  with 25mg  for a total of 225mg  daily 02/23/15  Yes Alycia Rossetti, MD  lamoTRIgine (LAMICTAL) 25 MG tablet Take 2 tablets (50 mg total) by mouth every morning. Takes 200mg  with 25mg  for a total of 225mg  daily 02/23/15  Yes Alycia Rossetti, MD  levETIRAcetam (KEPPRA) 250 MG tablet TAKE 1 TABLET BY MOUTH IN THE MORNING AND TAKE 2 TABLETS AT BEDTIME. Patient taking differently: TAKE 250 MG BY MOUTH IN THE MORNING AND TAKE 500MG  AT BEDTIME. 04/24/15  Yes Kathrynn Ducking, MD  levothyroxine (SYNTHROID, LEVOTHROID) 50 MCG tablet TAKE ONE TABLET BY MOUTH ONCE DAILY. Patient taking differently: TAKE 50 MCG BY MOUTH ONCE DAILY. 12/28/14  Yes Orlena Sheldon, PA-C  Probiotic Product (PROBIOTIC DAILY PO) Take 1 tablet by mouth daily.    Yes Historical Provider, MD  SYMBICORT 160-4.5 MCG/ACT inhaler USE 2 PUFFS TWICE DAILY AS NEEDED. Patient taking differently: USE 2 PUFFS TWICE DAILY AS NEEDED FOR SHORTNESS OF BREATH 11/06/14  Yes Orlena Sheldon, PA-C  traZODone (DESYREL) 100 MG tablet Take 1 tablet (100 mg total) by mouth at bedtime. 02/23/15  Yes Alycia Rossetti, MD  valACYclovir (VALTREX) 1000 MG tablet TAKE 2 TABLETS BY MOUTH TWICE A DAY FOR 1 DAY. Patient not taking: Reported on 03/13/2015 03/02/15   Alycia Rossetti, MD   BP 116/69 mmHg  Pulse 70  Temp(Src) 98.1 F (36.7 C) (Oral)  Resp 16  Ht 5' 6.25" (1.683 m)  Wt 155 lb (70.308 kg)  BMI 24.82 kg/m2  SpO2 100% Physical Exam  Constitutional: She is oriented to person, place, and time. She appears well-developed and well-nourished. No distress.  HENT:  Head: Normocephalic and atraumatic.  Mouth/Throat: Oropharynx is clear and moist.  Eyes: EOM are normal. Pupils are equal, round, and reactive to light.  Neck: Normal range of motion. Neck supple.  Cardiovascular: Normal rate and regular rhythm.  Exam reveals no gallop and no friction rub.   No murmur heard. Pulmonary/Chest: Effort normal and  breath sounds normal. No respiratory distress. She has no wheezes.  Abdominal: Soft. Bowel sounds are normal. She exhibits no distension. There is no tenderness.  Musculoskeletal: Normal range of motion.  Lymphadenopathy:    She has no cervical adenopathy.  Neurological: She is alert and oriented to person, place, and time. No cranial nerve deficit. She exhibits normal muscle tone. Coordination normal.  Skin: Skin is warm and dry. She is not diaphoretic.  Nursing note and vitals reviewed.   ED Course  Procedures (including critical care time) Labs Review Labs Reviewed  COMPREHENSIVE METABOLIC PANEL  CBC  WITH DIFFERENTIAL/PLATELET  LIPASE, BLOOD  URINALYSIS, ROUTINE W REFLEX MICROSCOPIC (NOT AT [facility])    Imaging Review No results found.   EKG Interpretation None      MDM   Final diagnoses:  None    Patient is a 54 year old female with history of gastroparesis, CVA, and IBS who presents with multiple complaints. Her workup is essentially unremarkable and her physical examination is reassuring. I see nothing emergent that requires intervention or hospitalization. She appears very stable. She seems somewhat emotionally labile and suspect this may be the explanation for some of her symptoms. She will be discharged to home, to return as needed for any problems.    Veryl Speak, MD 04/26/15 1311

## 2015-04-26 NOTE — ED Notes (Signed)
Pt continues to receive bolus of fluids and unable to void at present time; will reassess pt urge to void shortly.

## 2015-04-26 NOTE — ED Notes (Signed)
Two attempts unable to draw enough blood for lab draw.

## 2015-04-26 NOTE — ED Notes (Addendum)
Patient states she has bipolar and was hyperventilating when she called EMS. Patient states "I just don't feel good and want to be seen medically." Patiaeant also c/o nausea.

## 2015-04-26 NOTE — ED Notes (Signed)
Per EMS- Patient c/o SOB, frequent crying, anxiety. Patient had a recent hip replacement.

## 2015-04-26 NOTE — Discharge Instructions (Signed)
General Headache Without Cause A headache is pain or discomfort felt around the head or neck area. The specific cause of a headache may not be found. There are many causes and types of headaches. A few common ones are:  Tension headaches.  Migraine headaches.  Cluster headaches.  Chronic daily headaches. HOME CARE INSTRUCTIONS   Keep all follow-up appointments with your caregiver or any specialist referral.  Only take over-the-counter or prescription medicines for pain or discomfort as directed by your caregiver.  Lie down in a dark, quiet room when you have a headache.  Keep a headache journal to find out what may trigger your migraine headaches. For example, write down:  What you eat and drink.  How much sleep you get.  Any change to your diet or medicines.  Try massage or other relaxation techniques.  Put ice packs or heat on the head and neck. Use these 3 to 4 times per day for 15 to 20 minutes each time, or as needed.  Limit stress.  Sit up straight, and do not tense your muscles.  Quit smoking if you smoke.  Limit alcohol use.  Decrease the amount of caffeine you drink, or stop drinking caffeine.  Eat and sleep on a regular schedule.  Get 7 to 9 hours of sleep, or as recommended by your caregiver.  Keep lights dim if bright lights bother you and make your headaches worse. SEEK MEDICAL CARE IF:   You have problems with the medicines you were prescribed.  Your medicines are not working.  You have a change from the usual headache.  You have nausea or vomiting. SEEK IMMEDIATE MEDICAL CARE IF:   Your headache becomes severe.  You have a fever.  You have a stiff neck.  You have loss of vision.  You have muscular weakness or loss of muscle control.  You start losing your balance or have trouble walking.  You feel faint or pass out.  You have severe symptoms that are different from your first symptoms. MAKE SURE YOU:   Understand these  instructions.  Will watch your condition.  Will get help right away if you are not doing well or get worse. Document Released: 11/10/2005 Document Revised: 02/02/2012 Document Reviewed: 11/26/2011 [facility] Patient Information 2015 [place], [place]. This information is not intended to replace advice given to you by your health care provider. Make sure you discuss any questions you have with your health care provider.  Weakness Weakness is a lack of strength. It may be felt all over the body (generalized) or in one specific part of the body (focal). Some causes of weakness can be serious. You may need further medical evaluation, especially if you are elderly or you have a history of immunosuppression (such as chemotherapy or HIV), kidney disease, heart disease, or diabetes. CAUSES  Weakness can be caused by many different things, including:  Infection.  Physical exhaustion.  Internal bleeding or other blood loss that results in a lack of red blood cells (anemia).  Dehydration. This cause is more common in elderly people.  Side effects or electrolyte abnormalities from medicines, such as pain medicines or sedatives.  Emotional distress, anxiety, or depression.  Circulation problems, especially severe peripheral arterial disease.  Heart disease, such as rapid atrial fibrillation, bradycardia, or heart failure.  Nervous system disorders, such as Guillain-Barr syndrome, multiple sclerosis, or stroke. DIAGNOSIS  To find the cause of your weakness, your caregiver will take your history and perform a physical exam. Lab tests or X-rays  may also be ordered, if needed. TREATMENT  Treatment of weakness depends on the cause of your symptoms and can vary greatly. HOME CARE INSTRUCTIONS   Rest as needed.  Eat a well-balanced diet.  Try to get some exercise every day.  Only take over-the-counter or prescription medicines as directed by your caregiver. SEEK MEDICAL CARE IF:   Your weakness  seems to be getting worse or spreads to other parts of your body.  You develop new aches or pains. SEEK IMMEDIATE MEDICAL CARE IF:   You cannot perform your normal daily activities, such as getting dressed and feeding yourself.  You cannot walk up and down stairs, or you feel exhausted when you do so.  You have shortness of breath or chest pain.  You have difficulty moving parts of your body.  You have weakness in only one area of the body or on only one side of the body.  You have a fever.  You have trouble speaking or swallowing.  You cannot control your bladder or bowel movements.  You have black or bloody vomit or stools. MAKE SURE YOU:  Understand these instructions.  Will watch your condition.  Will get help right away if you are not doing well or get worse. Document Released: 11/10/2005 Document Revised: 05/11/2012 Document Reviewed: 01/09/2012 [facility] Patient Information 2015 [place], [place]. This information is not intended to replace advice given to you by your health care provider. Make sure you discuss any questions you have with your health care provider.

## 2015-05-04 ENCOUNTER — Telehealth: Payer: Self-pay

## 2015-05-04 NOTE — Telephone Encounter (Signed)
She was supposed to return in 6 weeks after her visit Dec 2015. She has a history of gastroparesis, so this would explain nausea. We can see her in a non-urgent slot. As she no-showed, I don't see this as urgent. There should not be a contraindication to her having hip surgery, but if this needs to be done before surgery, needs to be seen here.

## 2015-05-04 NOTE — Telephone Encounter (Signed)
Pt is calling because she is having some nausea and she is schedule for hip surgery on 05/23/15. They are wanting her to be seen before her surgery to make sure that she will be able to be put to sleep since she is having the nausea. She does have some zofran. Please advise

## 2015-05-07 NOTE — Telephone Encounter (Signed)
Called her to make an appointment but she said that everything was fine now. She talked with her surgeon and she said that told her that she did not need to be seen before surgery.

## 2015-05-10 ENCOUNTER — Telehealth: Payer: Self-pay | Admitting: Family Medicine

## 2015-05-10 NOTE — Telephone Encounter (Signed)
Patient is calling to see if she can increase her [place]  ( she says this is urgent)    [phone]

## 2015-05-11 MED ORDER — ARIPIPRAZOLE 5 MG PO TABS: 5.0000 mg | ORAL_TABLET | Freq: Every day | ORAL | Status: DC

## 2015-05-11 NOTE — Telephone Encounter (Signed)
Increase abilify to 5 mg poqday and NTBS next week.

## 2015-05-11 NOTE — Progress Notes (Signed)
Please put orders in Epic surgery 05-23-15 pre op 05-17-15 Thanks

## 2015-05-11 NOTE — Telephone Encounter (Signed)
Pt aware and med sent to pharm and pt will call back to schedule appt.

## 2015-05-15 ENCOUNTER — Ambulatory Visit: Payer: Self-pay | Admitting: Orthopedic Surgery

## 2015-05-15 ENCOUNTER — Ambulatory Visit (INDEPENDENT_AMBULATORY_CARE_PROVIDER_SITE_OTHER): Payer: Medicaid Other | Admitting: Family Medicine

## 2015-05-15 ENCOUNTER — Encounter: Payer: Self-pay | Admitting: Family Medicine

## 2015-05-15 VITALS — BP 108/76 | HR 74 | Temp 98.4°F | Resp 16 | Ht 67.0 in | Wt 155.0 lb

## 2015-05-15 DIAGNOSIS — F319 Bipolar disorder, unspecified: Secondary | ICD-10-CM

## 2015-05-15 MED ORDER — ARIPIPRAZOLE 2 MG PO TABS: 2.0000 mg | ORAL_TABLET | Freq: Every day | ORAL | Status: DC

## 2015-05-15 NOTE — Patient Instructions (Addendum)
EARLENE BJELLAND  05/15/2015   Your procedure is scheduled on:    05/23/2015     Report to [facility]  Entrance take [facility]  elevators to 3rd floor  and follow signs to  [place] at      115pm  Call this number if you have problems the morning of surgery [phone]   Remember: ONLY 1 PERSON MAY GO WITH YOU TO SHORT STAY TO GET  READY MORNING OF YOUR SURGERY.  Do not eat food after midnite.  May have clear liquids from 12 midnite until 0830 am morning of surgery then nothing by mouth.      Take these medicines the morning of surgery with A SIP OF WATER:    Klonopin , , lamictal, Keppra, Synthroid, Symbicort Inhaler if needed and bring                                You may not have any metal on your body including hair pins and              piercings  Do not wear jewelry, make-up, lotions, powders or perfumes, deodorant             Do not wear nail polish.  Do not shave  48 hours prior to surgery.                Do not bring valuables to the hospital. [place].  Contacts, dentures or bridgework may not be worn into surgery.  Leave suitcase in the car. After surgery it may be brought to your room.         Special Instructions: coughing and deep breathing exercises, leg exercises               Please read over the following fact sheets you were given: _____________________________________________________________________             [facility] - Preparing for Surgery Before surgery, you can play an important role.  Because skin is not sterile, your skin needs to be as free of germs as possible.  You can reduce the number of germs on your skin by washing with CHG (chlorahexidine gluconate) soap before surgery.  CHG is an antiseptic cleaner which kills germs and bonds with the skin to continue killing germs even after washing. Please DO NOT use if you have an allergy to CHG or antibacterial soaps.  If  your skin becomes reddened/irritated stop using the CHG and inform your nurse when you arrive at Short Stay. Do not shave (including legs and underarms) for at least 48 hours prior to the first CHG shower.  You may shave your face/neck. Please follow these instructions carefully:  1.  Shower with CHG Soap the night before surgery and the  morning of Surgery.  2.  If you choose to wash your hair, wash your hair first as usual with your  normal  shampoo.  3.  After you shampoo, rinse your hair and body thoroughly to remove the  shampoo.                           4.  Use CHG as you would any other  liquid soap.  You can apply chg directly  to the skin and wash                       Gently with a scrungie or clean washcloth.  5.  Apply the CHG Soap to your body ONLY FROM THE NECK DOWN.   Do not use on face/ open                           Wound or open sores. Avoid contact with eyes, ears mouth and genitals (private parts).                       Wash face,  Genitals (private parts) with your normal soap.             6.  Wash thoroughly, paying special attention to the area where your surgery  will be performed.  7.  Thoroughly rinse your body with warm water from the neck down.  8.  DO NOT shower/wash with your normal soap after using and rinsing off  the CHG Soap.                9.  Pat yourself dry with a clean towel.            10.  Wear clean pajamas.            11.  Place clean sheets on your bed the night of your first shower and do not  sleep with pets. Day of Surgery : Do not apply any lotions/deodorants the morning of surgery.  Please wear clean clothes to the hospital/surgery center.  FAILURE TO FOLLOW THESE INSTRUCTIONS MAY RESULT IN THE CANCELLATION OF YOUR SURGERY PATIENT SIGNATURE_________________________________  NURSE SIGNATURE__________________________________  ________________________________________________________________________  WHAT IS A BLOOD TRANSFUSION? Blood Transfusion  Information  A transfusion is the replacement of blood or some of its parts. Blood is made up of multiple cells which provide different functions.  Red blood cells carry oxygen and are used for blood loss replacement.  White blood cells fight against infection.  Platelets control bleeding.  Plasma helps clot blood.  Other blood products are available for specialized needs, such as hemophilia or other clotting disorders. BEFORE THE TRANSFUSION  Who gives blood for transfusions?   Healthy volunteers who are fully evaluated to make sure their blood is safe. This is blood bank blood. Transfusion therapy is the safest it has ever been in the practice of medicine. Before blood is taken from a donor, a complete history is taken to make sure that person has no history of diseases nor engages in risky social behavior (examples are intravenous drug use or sexual activity with multiple partners). The donor's travel history is screened to minimize risk of transmitting infections, such as malaria. The donated blood is tested for signs of infectious diseases, such as HIV and hepatitis. The blood is then tested to be sure it is compatible with you in order to minimize the chance of a transfusion reaction. If you or a relative donates blood, this is often done in anticipation of surgery and is not appropriate for emergency situations. It takes many days to process the donated blood. RISKS AND COMPLICATIONS Although transfusion therapy is very safe and saves many lives, the main dangers of transfusion include:  1. Getting an infectious disease. 2. Developing a transfusion reaction. This is an allergic reaction to something in the blood  you were given. Every precaution is taken to prevent this. The decision to have a blood transfusion has been considered carefully by your caregiver before blood is given. Blood is not given unless the benefits outweigh the risks. AFTER THE TRANSFUSION  Right after receiving a  blood transfusion, you will usually feel much better and more energetic. This is especially true if your red blood cells have gotten low (anemic). The transfusion raises the level of the red blood cells which carry oxygen, and this usually causes an energy increase.  The nurse administering the transfusion will monitor you carefully for complications. HOME CARE INSTRUCTIONS  No special instructions are needed after a transfusion. You may find your energy is better. Speak with your caregiver about any limitations on activity for underlying diseases you may have. SEEK MEDICAL CARE IF:   Your condition is not improving after your transfusion.  You develop redness or irritation at the intravenous (IV) site. SEEK IMMEDIATE MEDICAL CARE IF:  Any of the following symptoms occur over the next 12 hours:  Shaking chills.  You have a temperature by mouth above 102 F (38.9 C), not controlled by medicine.  Chest, back, or muscle pain.  People around you feel you are not acting correctly or are confused.  Shortness of breath or difficulty breathing.  Dizziness and fainting.  You get a rash or develop hives.  You have a decrease in urine output.  Your urine turns a dark color or changes to pink, red, or brown. Any of the following symptoms occur over the next 10 days:  You have a temperature by mouth above 102 F (38.9 C), not controlled by medicine.  Shortness of breath.  Weakness after normal activity.  The white part of the eye turns yellow (jaundice).  You have a decrease in the amount of urine or are urinating less often.  Your urine turns a dark color or changes to pink, red, or brown. Document Released: 11/07/2000 Document Revised: 02/02/2012 Document Reviewed: 06/26/2008 ExitCare Patient Information 2014 [place].  _______________________________________________________________________  Incentive Spirometer  An incentive spirometer is a tool that can help keep  your lungs clear and active. This tool measures how well you are filling your lungs with each breath. Taking long deep breaths may help reverse or decrease the chance of developing breathing (pulmonary) problems (especially infection) following:  A long period of time when you are unable to move or be active. BEFORE THE PROCEDURE   If the spirometer includes an indicator to show your best effort, your nurse or respiratory therapist will set it to a desired goal.  If possible, sit up straight or lean slightly forward. Try not to slouch.  Hold the incentive spirometer in an upright position. INSTRUCTIONS FOR USE  3. Sit on the edge of your bed if possible, or sit up as far as you can in bed or on a chair. 4. Hold the incentive spirometer in an upright position. 5. Breathe out normally. 6. Place the mouthpiece in your mouth and seal your lips tightly around it. 7. Breathe in slowly and as deeply as possible, raising the piston or the ball toward the top of the column. 8. Hold your breath for 3-5 seconds or for as long as possible. Allow the piston or ball to fall to the bottom of the column. 9. Remove the mouthpiece from your mouth and breathe out normally. 10. Rest for a few seconds and repeat Steps 1 through 7 at least 10 times every 1-2 hours  when you are awake. Take your time and take a few normal breaths between deep breaths. 11. The spirometer may include an indicator to show your best effort. Use the indicator as a goal to work toward during each repetition. 12. After each set of 10 deep breaths, practice coughing to be sure your lungs are clear. If you have an incision (the cut made at the time of surgery), support your incision when coughing by placing a pillow or rolled up towels firmly against it. Once you are able to get out of bed, walk around indoors and cough well. You may stop using the incentive spirometer when instructed by your caregiver.  RISKS AND COMPLICATIONS  Take your  time so you do not get dizzy or light-headed.  If you are in pain, you may need to take or ask for pain medication before doing incentive spirometry. It is harder to take a deep breath if you are having pain. AFTER USE  Rest and breathe slowly and easily.  It can be helpful to keep track of a log of your progress. Your caregiver can provide you with a simple table to help with this. If you are using the spirometer at home, follow these instructions: [place] IF:   You are having difficultly using the spirometer.  You have trouble using the spirometer as often as instructed.  Your pain medication is not giving enough relief while using the spirometer.  You develop fever of 100.5 F (38.1 C) or higher. SEEK IMMEDIATE MEDICAL CARE IF:   You cough up bloody sputum that had not been present before.  You develop fever of 102 F (38.9 C) or greater.  You develop worsening pain at or near the incision site. MAKE SURE YOU:   Understand these instructions.  Will watch your condition.  Will get help right away if you are not doing well or get worse. Document Released: 03/23/2007 Document Revised: 02/02/2012 Document Reviewed: 05/24/2007 ExitCare Patient Information 2014 ExitCare, [place].   ________________________________________________________________________    CLEAR LIQUID DIET   Foods Allowed                                                                     Foods Excluded  Coffee and tea, regular and decaf                             liquids that you cannot  Plain Jell-O in any flavor                                             see through such as: Fruit ices (not with fruit pulp)                                     milk, soups, orange juice  Iced Popsicles  All solid food Carbonated beverages, regular and diet                                    Cranberry, grape and apple juices Sports drinks like Gatorade Lightly seasoned  clear broth or consume(fat free) Sugar, honey syrup  Sample Menu Breakfast                                Lunch                                     Supper Cranberry juice                    Beef broth                            Chicken broth Jell-O                                     Grape juice                           Apple juice Coffee or tea                        Jell-O                                      Popsicle                                                Coffee or tea                        Coffee or tea  _____________________________________________________________________

## 2015-05-15 NOTE — Progress Notes (Signed)
Subjective:    Patient ID: Sheryl Hall, female    DOB: [birth year], 54 y.o.   MRN: [id number]  HPI  Please see the telephone note from last week, patient called stating that something needed to be done urgently. She was concerned that her bipolar was spiraling out of control. She was reporting racing thoughts and increasing insomnia. She questioned whether or not I could increase her Abilify. Patient was taking 2 mg of Abilify day which is an extremely low dose and therefore I believed it was reasonable to increase her to 5 mg a day. The patient is here today for follow-up. Patient states that she feels worse since increasing the Abilify. The medication makes her hyper somnolent. She states that she falls asleep around 4 or 5 in the afternoon and sleeps until 8:00 the following morning. She feels very groggy. She denies any stiffness or tremor or rigidity. She states that the racing thoughts have stopped. She denies any depression, delusions, uncontrolled anxiety, or suicidal thoughts. Past Medical History  Diagnosis Date  . PTSD (post-traumatic stress disorder)   . Bipolar 1 disorder   . Heart murmur   . Diverticulosis of colon   . Keratosis, actinic   . Family hx of colon cancer     age 53-father  . GERD (gastroesophageal reflux disease)   . Chronic nausea   . S/P endoscopy 05/30/2010    Dr Rourk-> non--critical Schatzki's ring, s/p 69F dilation  . S/P colonoscopy 05/30/2010    LAX sphincter tone, anal papilla, left-sided diverticulosis, normal random biopsies,, 1 polyp-TA  . Polysubstance abuse   . Tubular adenoma of colon 05/30/2010    Next colonoscopy 05/2015  . Anxiety   . Depression   . Hyperthyroidism   . Hyperlipidemia   . Folate deficiency   . Hx of abnormal Pap smear   . Low back pain 03/01/2013    MRI with multiple levels of disc bulge  . Headache   . Chronic left hip pain   . Avascular necrosis of hip     left  . Stroke 2014    Right parietal  . Seizures 02/25/14  . UTI  (urinary tract infection)    Past Surgical History  Procedure Laterality Date  . External ear surgery Left 12 years ago    skin graft from behind ear put in ear canal  . Neck surgery  10 years ago  . Colonoscopy      every 5 years  . Skin cancer destruction    . 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 Joint replacement    . Esophagogastroduodenoscopy  05/30/2010    KDX:[id number] ring/small HH otherwise normal  . Colonoscopy  05/30/2010    RMR:lax anal sphincter tone,anal papilla,otherwise normal/left-sided diverticula   Current Outpatient Prescriptions on File Prior to Visit  Medication Sig Dispense Refill  . ARIPiprazole (ABILIFY) 5 MG tablet Take 1 tablet (5 mg total) by mouth daily. 30 tablet 3  . aspirin 81 MG tablet Take 81 mg by mouth daily.    . clonazePAM (KLONOPIN) 1 MG tablet 1 tab po BID prn anxiety (Patient taking differently: Take 1 mg by mouth 2 (two) times daily as needed for anxiety. ) 60 tablet 0  . DEXILANT 60 MG capsule TAKE ONE CAPSULE BY MOUTH ONCE DAILY. (Patient taking differently: TAKE 60 MG BY  MOUTH ONCE DAILY.) 30 capsule 11  . folic acid (FOLVITE) 482 MCG tablet Take 400 mcg by mouth daily.    Marland Kitchen lamoTRIgine (LAMICTAL) 100 MG tablet Take 2.5 tablets (250 mg total) by mouth every morning. Takes 200mg  with 25mg  for a total of 225mg  daily 60 tablet 0  . lamoTRIgine (LAMICTAL) 25 MG tablet Take 2 tablets (50 mg total) by mouth every morning. Takes 200mg  with 25mg  for a total of 225mg  daily 60 tablet 0  . levETIRAcetam (KEPPRA) 250 MG tablet TAKE 1 TABLET BY MOUTH IN THE MORNING AND TAKE 2 TABLETS AT BEDTIME. (Patient taking differently: TAKE 250 MG BY MOUTH IN THE MORNING AND TAKE 500MG  AT BEDTIME.) 90 tablet 0  . levothyroxine (SYNTHROID, LEVOTHROID) 50 MCG tablet TAKE ONE TABLET BY MOUTH ONCE DAILY. (Patient  taking differently: TAKE 50 MCG BY MOUTH ONCE DAILY.) 30 tablet 5  . Probiotic Product (PROBIOTIC DAILY PO) Take 1 tablet by mouth daily.     . SYMBICORT 160-4.5 MCG/ACT inhaler USE 2 PUFFS TWICE DAILY AS NEEDED. (Patient taking differently: USE 2 PUFFS TWICE DAILY AS NEEDED FOR SHORTNESS OF BREATH) 10.2 g 2  . traZODone (DESYREL) 100 MG tablet Take 1 tablet (100 mg total) by mouth at bedtime. 30 tablet 0  . valACYclovir (VALTREX) 1000 MG tablet TAKE 2 TABLETS BY MOUTH TWICE A DAY FOR 1 DAY. 4 tablet 1   No current facility-administered medications on file prior to visit.   No Known Allergies History   Social History  . Marital Status: Divorced    Spouse Name: N/A  . Number of Children: 1  . Years of Education: N/A   Occupational History  . disabled    Social History Main Topics  . Smoking status: Current Every Day Smoker -- 0.50 packs/day for 36 years    Types: Cigarettes  . Smokeless tobacco: Never Used  . Alcohol Use: Yes     Comment: social  . Drug Use: No     Comment: hx cocaine abuse  . Sexual Activity: No   Other Topics Concern  . Not on file   Social History Narrative   Lives w/ fiance           Review of Systems  All other systems reviewed and are negative.      Objective:   Physical Exam  Constitutional: She is oriented to person, place, and time.  Cardiovascular: Normal rate, regular rhythm and normal heart sounds.   No murmur heard. Pulmonary/Chest: Effort normal and breath sounds normal. No respiratory distress. She has no wheezes. She has no rales.  Neurological: She is alert and oriented to person, place, and time. She has normal reflexes. She displays normal reflexes. No cranial nerve deficit. She exhibits normal muscle tone. Coordination normal.  Psychiatric: She has a normal mood and affect. Her behavior is normal. Judgment and thought content normal.  Vitals reviewed.         Assessment & Plan:  Bipolar I disorder, most recent episode  (or current) unspecified  I have asked the patient to wean back to 2 mg of Abilify a day. She was seemingly doing well on this dose. The higher dose has caused the patient some side effects that she cannot tolerate. She would like to decrease the dose back to 2 mg a day. We will monitor for any symptoms of mania on the reduced dose.

## 2015-05-15 NOTE — Progress Notes (Signed)
Preoperative surgical orders have been place into the Epic hospital system for Sheryl Hall on 05/15/2015, 10:40 AM  by Mickel Crow for surgery on 05-23-2015.  Preop Total Hip - Anterior Approach orders including IV Tylenol, and IV Decadron as long as there are no contraindications to the above medications. Arlee Muslim, PA-C

## 2015-05-17 ENCOUNTER — Encounter (HOSPITAL_COMMUNITY)
Admission: RE | Admit: 2015-05-17 | Discharge: 2015-05-17 | Disposition: A | Discharge status: Home / Self Care | Payer: Medicaid Other | Source: Ambulatory Visit | Attending: Orthopedic Surgery | Admitting: Orthopedic Surgery

## 2015-05-17 ENCOUNTER — Encounter (HOSPITAL_COMMUNITY): Payer: Self-pay

## 2015-05-17 DIAGNOSIS — Z01812 Encounter for preprocedural laboratory examination: Secondary | ICD-10-CM | POA: Insufficient documentation

## 2015-05-17 DIAGNOSIS — M1611 Unilateral primary osteoarthritis, right hip: Secondary | ICD-10-CM | POA: Insufficient documentation

## 2015-05-17 HISTORY — DX: Unspecified osteoarthritis, unspecified site: M19.90

## 2015-05-17 LAB — URINALYSIS, ROUTINE W REFLEX MICROSCOPIC
Glucose, UA: NEGATIVE mg/dL
HGB URINE DIPSTICK: NEGATIVE
Ketones, ur: NEGATIVE mg/dL
LEUKOCYTES UA: NEGATIVE
Nitrite: NEGATIVE
Protein, ur: NEGATIVE mg/dL
Specific Gravity, Urine: 1.035 — ABNORMAL HIGH (ref 1.005–1.030)
Urobilinogen, UA: 1 mg/dL (ref 0.0–1.0)
pH: 5.5 (ref 5.0–8.0)

## 2015-05-17 LAB — CBC
HEMATOCRIT: 42.8 % (ref 36.0–46.0)
Hemoglobin: 13.8 g/dL (ref 12.0–15.0)
MCH: 28.6 pg (ref 26.0–34.0)
MCHC: 32.2 g/dL (ref 30.0–36.0)
MCV: 88.6 fL (ref 78.0–100.0)
Platelets: 219 10*3/uL (ref 150–400)
RBC: 4.83 MIL/uL (ref 3.87–5.11)
RDW: 13.2 % (ref 11.5–15.5)
WBC: 6.5 10*3/uL (ref 4.0–10.5)

## 2015-05-17 LAB — PROTIME-INR
INR: 0.91 (ref 0.00–1.49)
Prothrombin Time: 12.5 seconds (ref 11.6–15.2)

## 2015-05-17 LAB — COMPREHENSIVE METABOLIC PANEL
ALT: 10 U/L — AB (ref 14–54)
AST: 15 U/L (ref 15–41)
Albumin: 4.2 g/dL (ref 3.5–5.0)
Alkaline Phosphatase: 108 U/L (ref 38–126)
Anion gap: 9 (ref 5–15)
BUN: 10 mg/dL (ref 6–20)
CALCIUM: 9.1 mg/dL (ref 8.9–10.3)
CHLORIDE: 106 mmol/L (ref 101–111)
CO2: 27 mmol/L (ref 22–32)
CREATININE: 0.95 mg/dL (ref 0.44–1.00)
GFR calc non Af Amer: >60 mL/min (ref >60)
GLUCOSE: 90 mg/dL (ref 65–99)
Potassium: 4.2 mmol/L (ref 3.5–5.1)
Sodium: 142 mmol/L (ref 135–145)
Total Bilirubin: 0.4 mg/dL (ref 0.3–1.2)
Total Protein: 6.9 g/dL (ref 6.5–8.1)

## 2015-05-17 LAB — SURGICAL PCR SCREEN
MRSA, PCR: NEGATIVE
Staphylococcus aureus: NEGATIVE

## 2015-05-17 LAB — APTT: aPTT: 28 seconds (ref 24–37)

## 2015-05-17 NOTE — Progress Notes (Addendum)
Clearance - Dr Dennard Schaumann on chart-05/15/15 - OV note in EPIC  12/2012 ECHO- EPIC

## 2015-05-21 ENCOUNTER — Other Ambulatory Visit: Payer: Self-pay | Admitting: Neurology

## 2015-05-22 ENCOUNTER — Ambulatory Visit: Payer: Self-pay | Admitting: Orthopedic Surgery

## 2015-05-22 NOTE — H&P (Signed)
Sheryl Hall DOB: 10/15/1961 Divorced / Language: Cleophus Molt / Race: White Female Date of Admission:  05/23/2015 CC:  Right Hip Pain History of Present Illness The patient is a 54 year old female who comes in for a preoperative History and Physical. The patient is scheduled for a right total hip arthroplasty (anterior) to be performed by Dr. Dione Plover. Aluisio, MD at [facility] on 05-23-2015. The patient is a 54 year old female who is being followed for their right hip pain and osteoarthritis. Symptoms reported  include: pain, aching, popping and grinding. The patient feels that they are doing poorly and report their pain level to be moderate. The following medication has been used for pain control: none. The patient has recently underwent an MRI of the right hip. She states the pain is worsening. The pain in her right hip is very similar to what she experienced in the left hip prior to when we had to fix that. It is hurting her at most times. It is starting to limit what she can and cannot do. I reviewed her MRI scan. She does have high-grade osteonecrosis of the right hip and now has some femoral head collapse. At this point the most predictable means of getting her better is going to be total hip arthroplasty. She did extremely well on the left side; I anticipate she will do just as well on this side. We did discuss it in detail, and she wants to proceed. They have been treated conservatively in the past for the above stated problem and despite conservative measures, they continue to have progressive pain and severe functional limitations and dysfunction. They have failed non-operative management including home exercise, medications. It is felt that they would benefit from undergoing total joint replacement. Risks and benefits of the procedure have been discussed with the patient and they elect to proceed with surgery. There are no active contraindications to surgery such as ongoing infection or  rapidly progressive neurological disease.   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 Cancer Father. Chronic Obstructive Lung Disease Father. Kidney disease Paternal Grandfather. Heart disease in female family member before age 44 Depression Father. Osteoporosis Mother. Heart Disease Brother, Maternal Grandfather, Mother, Paternal Grandmother. Heart disease in female family member before age 69 First Degree Relatives reported Congestive Heart Failure Maternal Grandmother, Paternal Grandmother. Cerebrovascular Accident Paternal Grandmother.  Social History Living situation live alone Children 1 Tobacco use Current every day smoker. 01/05/2014: smoke(d) 1/2 pack(s) per day Exercise Exercises never Current work status disabled Former drinker 01/05/2014: In the past drank beer only occasionally per week No history of drug/alcohol rehab Marital status divorced Number of flights of stairs before winded less than 1 Not under pain contract Tobacco / smoke exposure 01/05/2014: yes Post-Surgical Plans Home  Medication History  Abilify (2MG  Tablet, Oral every morning) Active. LamoTRIgine (25MG  Tablet, Oral) Active. Probiotic (Oral) Active. Aspirin EC (81MG  Tablet DR, Oral every morning) Active. Folic Acid (182XHB Tablet, Oral every morning) Active. LamoTRIgine (225mg  Oral every morning) Specific dose unknown - Active. TraZODone HCl (100MG  Tablet, Oral at bedtime) Active. LevETIRAcetam (500MG  Tablet, Oral at bedtime) Active. (250mg  in the morning) ClonazePAM (1MG  Tablet, Oral two times daily)  Active. Levothyroxine Sodium (50MCG Tablet, Oral  every morning) Active. Dexilant (60MG  Capsule DR, Oral at bedtime) Active. Symbicort (160-4.5MCG/ACT Aerosol, Inhalation) Active. ValACYclovir HCl (1GM Tablet, Oral) Active.   Past Surgical History  Spinal Fusion neck Neck Disc Surgery Ear Surgery Date: 1995. Total Hip Replacement - Left Date: 03/2014.   Seizure Disorder Depression Gastroesophageal Reflux Disease Irritable bowel syndrome Chronic Pain Cerebrovascular Accident approx 3 years ago Anxiety Disorder Psychiatric disorder Urinary Tract Infection Heart murmur   Review of Systems General Present- Fatigue. Not Present- Chills, Fever, Memory Loss, Night Sweats, Weight Gain and Weight Loss. Skin Not Present- Eczema, Hives, Itching, Lesions and Rash. HEENT Not Present- Dentures, Double Vision, Headache, Hearing Loss, Tinnitus and Visual Loss. Respiratory Not Present- Allergies, Chronic Cough, Coughing up blood, Shortness of breath at rest and Shortness of breath with exertion. Cardiovascular Not Present- Chest Pain, Difficulty Breathing Lying Down, Murmur, Palpitations, Racing/skipping heartbeats and Swelling. Gastrointestinal Not Present- Abdominal Pain, Bloody Stool, Constipation, Diarrhea, Difficulty Swallowing, Heartburn, Jaundice, Loss of appetitie, Nausea and Vomiting. Female Genitourinary Not Present- Blood in Urine, Discharge, Flank Pain, Incontinence, Painful Urination, Urgency, Urinary frequency, Urinary Retention, Urinating at Night and Weak urinary stream. Musculoskeletal Present- Joint Pain. Not Present- Back Pain, Joint Swelling, Morning Stiffness, Muscle Pain, Muscle Weakness and Spasms. Neurological Not Present- Blackout spells, Difficulty with balance, Dizziness, Paralysis, Tremor and Weakness. Psychiatric Not Present- Insomnia.  Vitals Weight: 154 lb Height: 66in Weight was reported by patient. Height was reported by patient. Body  Surface Area: 1.79 m Body Mass Index: 24.86 kg/m  BP: 128/64 (Sitting, Right Arm, Standard)   Physical Exam General Mental Status -Alert, cooperative and good historian. General Appearance-pleasant, Not in acute distress. Orientation-Oriented X3. Build & Nutrition-Well nourished and Well developed.  Head and Neck Head-normocephalic, atraumatic . Neck Global Assessment - supple, no bruit auscultated on the right, no bruit auscultated on the left.  Eye Pupil - Bilateral-Regular and Round. Motion - Bilateral-EOMI.  ENMT Note: partial lower denture plate  Chest and Lung Exam Auscultation Breath sounds - clear at anterior chest wall and clear at posterior chest wall. Adventitious sounds - No Adventitious sounds.  Cardiovascular Auscultation Rhythm - Regular rate and rhythm. Heart Sounds - S1 WNL and S2 WNL. Murmurs & Other Heart Sounds: Murmur 1 - Location - Aortic Area. Timing - Early systolic. Grade - II/VI.  Abdomen Palpation/Percussion Tenderness - Abdomen is non-tender to palpation. Rigidity (guarding) - Abdomen is soft. Auscultation Auscultation of the abdomen reveals - Bowel sounds normal.  Female Genitourinary Note: Not done, not pertinent to present illness   Musculoskeletal Note: On exam she is in no distress. Her right hip can be flexed to about 100, rotated in 20 and out 30, abducted 30 with discomfort. Left hip flexion to 130, rotation in 30 and out 40, abduction 40 without discomfort.  IMAGING MRI scan - she has high-grade osteonecrosis of the right hip and now has some femoral head collapse.  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 Dennard Schaumann - Patient has been seen preoperatively and felt to be stable for surgery.  Topical TXA -  History of Stroke  Anesthesia Issues: None  Signed electronically by Ok Edwards, III PA-C

## 2015-05-23 ENCOUNTER — Inpatient Hospital Stay (HOSPITAL_COMMUNITY)
Admission: RE | Admit: 2015-05-23 | Discharge: 2015-05-25 | DRG: 470 | Disposition: A | Discharge status: Home Health | Payer: Medicaid Other | Source: Ambulatory Visit | Attending: Orthopedic Surgery | Admitting: Orthopedic Surgery

## 2015-05-23 ENCOUNTER — Inpatient Hospital Stay (HOSPITAL_COMMUNITY): Payer: Medicaid Other

## 2015-05-23 ENCOUNTER — Inpatient Hospital Stay (HOSPITAL_COMMUNITY): Payer: Medicaid Other | Admitting: Anesthesiology

## 2015-05-23 ENCOUNTER — Encounter (HOSPITAL_COMMUNITY)
Admission: RE | Disposition: A | Discharge status: Home Health | Payer: Self-pay | Source: Ambulatory Visit | Attending: Orthopedic Surgery

## 2015-05-23 ENCOUNTER — Encounter (HOSPITAL_COMMUNITY): Payer: Self-pay | Admitting: *Deleted

## 2015-05-23 DIAGNOSIS — F419 Anxiety disorder, unspecified: Secondary | ICD-10-CM | POA: Diagnosis present

## 2015-05-23 DIAGNOSIS — F4310 Post-traumatic stress disorder, unspecified: Secondary | ICD-10-CM | POA: Diagnosis present

## 2015-05-23 DIAGNOSIS — Z96649 Presence of unspecified artificial hip joint: Secondary | ICD-10-CM

## 2015-05-23 DIAGNOSIS — M87051 Idiopathic aseptic necrosis of right femur: Secondary | ICD-10-CM | POA: Diagnosis present

## 2015-05-23 DIAGNOSIS — Z96642 Presence of left artificial hip joint: Secondary | ICD-10-CM | POA: Diagnosis present

## 2015-05-23 DIAGNOSIS — Z981 Arthrodesis status: Secondary | ICD-10-CM

## 2015-05-23 DIAGNOSIS — K219 Gastro-esophageal reflux disease without esophagitis: Secondary | ICD-10-CM | POA: Diagnosis present

## 2015-05-23 DIAGNOSIS — Z7951 Long term (current) use of inhaled steroids: Secondary | ICD-10-CM | POA: Diagnosis not present

## 2015-05-23 DIAGNOSIS — Z6824 Body mass index (BMI) 24.0-24.9, adult: Secondary | ICD-10-CM | POA: Diagnosis not present

## 2015-05-23 DIAGNOSIS — Z8673 Personal history of transient ischemic attack (TIA), and cerebral infarction without residual deficits: Secondary | ICD-10-CM | POA: Diagnosis not present

## 2015-05-23 DIAGNOSIS — Z79899 Other long term (current) drug therapy: Secondary | ICD-10-CM | POA: Diagnosis not present

## 2015-05-23 DIAGNOSIS — F17210 Nicotine dependence, cigarettes, uncomplicated: Secondary | ICD-10-CM | POA: Diagnosis present

## 2015-05-23 DIAGNOSIS — M169 Osteoarthritis of hip, unspecified: Secondary | ICD-10-CM | POA: Diagnosis present

## 2015-05-23 DIAGNOSIS — Z01812 Encounter for preprocedural laboratory examination: Secondary | ICD-10-CM

## 2015-05-23 DIAGNOSIS — F319 Bipolar disorder, unspecified: Secondary | ICD-10-CM | POA: Diagnosis present

## 2015-05-23 DIAGNOSIS — Z7982 Long term (current) use of aspirin: Secondary | ICD-10-CM | POA: Diagnosis not present

## 2015-05-23 DIAGNOSIS — G8929 Other chronic pain: Secondary | ICD-10-CM | POA: Diagnosis present

## 2015-05-23 DIAGNOSIS — Z825 Family history of asthma and other chronic lower respiratory diseases: Secondary | ICD-10-CM | POA: Diagnosis not present

## 2015-05-23 DIAGNOSIS — G40909 Epilepsy, unspecified, not intractable, without status epilepticus: Secondary | ICD-10-CM | POA: Diagnosis present

## 2015-05-23 DIAGNOSIS — Z8249 Family history of ischemic heart disease and other diseases of the circulatory system: Secondary | ICD-10-CM | POA: Diagnosis not present

## 2015-05-23 DIAGNOSIS — M1611 Unilateral primary osteoarthritis, right hip: Secondary | ICD-10-CM | POA: Diagnosis present

## 2015-05-23 DIAGNOSIS — M25551 Pain in right hip: Secondary | ICD-10-CM | POA: Diagnosis present

## 2015-05-23 HISTORY — PX: TOTAL HIP ARTHROPLASTY: SHX124

## 2015-05-23 LAB — TYPE AND SCREEN
ABO/RH(D): O POS
Antibody Screen: NEGATIVE

## 2015-05-23 SURGERY — ARTHROPLASTY, HIP, TOTAL, ANTERIOR APPROACH
Anesthesia: General | Site: Hip | Laterality: Right

## 2015-05-23 MED ORDER — METHOCARBAMOL 1000 MG/10ML IJ SOLN
500.0000 mg | Freq: Four times a day (QID) | INTRAVENOUS | Status: DC | PRN
  Administered 2015-05-23: 500 mg via INTRAVENOUS
  Filled 2015-05-23 (×2): qty 5

## 2015-05-23 MED ORDER — 0.9 % SODIUM CHLORIDE (POUR BTL) OPTIME
TOPICAL | Status: DC | PRN
  Administered 2015-05-23: 1000 mL

## 2015-05-23 MED ORDER — OXYCODONE HCL 5 MG PO TABS
5.0000 mg | ORAL_TABLET | ORAL | Status: DC | PRN
  Administered 2015-05-23 – 2015-05-25 (×9): 10 mg via ORAL
  Filled 2015-05-23 (×9): qty 2

## 2015-05-23 MED ORDER — METHOCARBAMOL 500 MG PO TABS
500.0000 mg | ORAL_TABLET | Freq: Four times a day (QID) | ORAL | Status: DC | PRN
  Administered 2015-05-23 – 2015-05-24 (×3): 500 mg via ORAL
  Filled 2015-05-23 (×3): qty 1

## 2015-05-23 MED ORDER — MENTHOL 3 MG MT LOZG
1.0000 | LOZENGE | OROMUCOSAL | Status: DC | PRN
Start: 2015-05-23 — End: 2015-05-25

## 2015-05-23 MED ORDER — BUPIVACAINE LIPOSOME 1.3 % IJ SUSP
20.0000 mL | Freq: Once | INTRAMUSCULAR | Status: DC
  Filled 2015-05-23: qty 20

## 2015-05-23 MED ORDER — MORPHINE SULFATE 2 MG/ML IJ SOLN
1.0000 mg | INTRAMUSCULAR | Status: DC | PRN
  Administered 2015-05-23: 1 mg via INTRAVENOUS
  Filled 2015-05-23: qty 1

## 2015-05-23 MED ORDER — ACETAMINOPHEN 325 MG PO TABS: 650.0000 mg | ORAL_TABLET | Freq: Four times a day (QID) | ORAL | Status: DC | PRN

## 2015-05-23 MED ORDER — TRANEXAMIC ACID 1000 MG/10ML IV SOLN
2000.0000 mg | INTRAVENOUS | Status: DC | PRN
  Administered 2015-05-23: 2000 mg via TOPICAL

## 2015-05-23 MED ORDER — DOCUSATE SODIUM 100 MG PO CAPS
100.0000 mg | ORAL_CAPSULE | Freq: Two times a day (BID) | ORAL | Status: DC
  Administered 2015-05-23 – 2015-05-25 (×4): 100 mg via ORAL

## 2015-05-23 MED ORDER — ACETAMINOPHEN 650 MG RE SUPP: 650.0000 mg | Freq: Four times a day (QID) | RECTAL | Status: DC | PRN

## 2015-05-23 MED ORDER — ONDANSETRON HCL 4 MG/2ML IJ SOLN: 4.0000 mg | Freq: Four times a day (QID) | INTRAMUSCULAR | Status: DC | PRN

## 2015-05-23 MED ORDER — ACETAMINOPHEN 10 MG/ML IV SOLN
1000.0000 mg | Freq: Once | INTRAVENOUS | Status: AC
  Administered 2015-05-23: 1000 mg via INTRAVENOUS

## 2015-05-23 MED ORDER — FENTANYL CITRATE (PF) 100 MCG/2ML IJ SOLN
INTRAMUSCULAR | Status: AC
  Filled 2015-05-23: qty 2

## 2015-05-23 MED ORDER — DEXTROSE-NACL 5-0.45 % IV SOLN
INTRAVENOUS | Status: DC
  Administered 2015-05-23: 19:00:00 via INTRAVENOUS

## 2015-05-23 MED ORDER — POLYETHYLENE GLYCOL 3350 17 G PO PACK: 17.0000 g | PACK | Freq: Every day | ORAL | Status: DC | PRN

## 2015-05-23 MED ORDER — TRAMADOL HCL 50 MG PO TABS: 50.0000 mg | ORAL_TABLET | Freq: Four times a day (QID) | ORAL | Status: DC | PRN

## 2015-05-23 MED ORDER — FENTANYL CITRATE (PF) 100 MCG/2ML IJ SOLN
25.0000 ug | INTRAMUSCULAR | Status: DC | PRN
  Administered 2015-05-23 (×2): 25 ug via INTRAVENOUS
  Administered 2015-05-23: 50 ug via INTRAVENOUS

## 2015-05-23 MED ORDER — CEFAZOLIN SODIUM-DEXTROSE 2-3 GM-% IV SOLR
2.0000 g | INTRAVENOUS | Status: AC
  Administered 2015-05-23: 2 g via INTRAVENOUS

## 2015-05-23 MED ORDER — MEPERIDINE HCL 50 MG/ML IJ SOLN: 6.2500 mg | INTRAMUSCULAR | Status: DC | PRN

## 2015-05-23 MED ORDER — LEVOTHYROXINE SODIUM 50 MCG PO TABS
50.0000 ug | ORAL_TABLET | Freq: Every day | ORAL | Status: DC
  Administered 2015-05-24 – 2015-05-25 (×2): 50 ug via ORAL
  Filled 2015-05-23 (×3): qty 1

## 2015-05-23 MED ORDER — ONDANSETRON HCL 4 MG PO TABS: 4.0000 mg | ORAL_TABLET | Freq: Four times a day (QID) | ORAL | Status: DC | PRN

## 2015-05-23 MED ORDER — METOCLOPRAMIDE HCL 10 MG PO TABS
5.0000 mg | ORAL_TABLET | Freq: Three times a day (TID) | ORAL | Status: DC | PRN
  Administered 2015-05-25: 10 mg via ORAL
  Filled 2015-05-23: qty 1

## 2015-05-23 MED ORDER — PROPOFOL 10 MG/ML IV BOLUS
INTRAVENOUS | Status: DC | PRN
  Administered 2015-05-23: 150 mg via INTRAVENOUS

## 2015-05-23 MED ORDER — LEVETIRACETAM 500 MG PO TABS
500.0000 mg | ORAL_TABLET | Freq: Every evening | ORAL | Status: DC
  Administered 2015-05-23 – 2015-05-24 (×2): 500 mg via ORAL
  Filled 2015-05-23 (×3): qty 1

## 2015-05-23 MED ORDER — PROPOFOL 10 MG/ML IV BOLUS
INTRAVENOUS | Status: AC
  Filled 2015-05-23: qty 20

## 2015-05-23 MED ORDER — SODIUM CHLORIDE 0.9 % IV SOLN: INTRAVENOUS | Status: DC

## 2015-05-23 MED ORDER — LACTATED RINGERS IV SOLN
INTRAVENOUS | Status: DC
  Administered 2015-05-23: 17:00:00 via INTRAVENOUS

## 2015-05-23 MED ORDER — CHLORHEXIDINE GLUCONATE 4 % EX LIQD: 60.0000 mL | Freq: Once | CUTANEOUS | Status: DC

## 2015-05-23 MED ORDER — STERILE WATER FOR IRRIGATION IR SOLN
Status: DC | PRN
  Administered 2015-05-23: 1500 mL

## 2015-05-23 MED ORDER — SUCCINYLCHOLINE CHLORIDE 20 MG/ML IJ SOLN
INTRAMUSCULAR | Status: DC | PRN
  Administered 2015-05-23: 100 mg via INTRAVENOUS

## 2015-05-23 MED ORDER — MIDAZOLAM HCL 5 MG/5ML IJ SOLN
INTRAMUSCULAR | Status: DC | PRN
  Administered 2015-05-23 (×2): 1 mg via INTRAVENOUS

## 2015-05-23 MED ORDER — TRAZODONE HCL 100 MG PO TABS
100.0000 mg | ORAL_TABLET | Freq: Every day | ORAL | Status: DC
  Administered 2015-05-23 – 2015-05-24 (×2): 100 mg via ORAL
  Filled 2015-05-23 (×3): qty 1

## 2015-05-23 MED ORDER — LAMOTRIGINE 25 MG PO TABS: 25.0000 mg | ORAL_TABLET | Freq: Every morning | ORAL | Status: DC

## 2015-05-23 MED ORDER — FENTANYL CITRATE (PF) 100 MCG/2ML IJ SOLN
INTRAMUSCULAR | Status: DC | PRN
  Administered 2015-05-23 (×5): 50 ug via INTRAVENOUS

## 2015-05-23 MED ORDER — DEXAMETHASONE SODIUM PHOSPHATE 10 MG/ML IJ SOLN
INTRAMUSCULAR | Status: DC | PRN
  Administered 2015-05-23: 10 mg via INTRAVENOUS

## 2015-05-23 MED ORDER — DEXAMETHASONE SODIUM PHOSPHATE 10 MG/ML IJ SOLN
10.0000 mg | Freq: Once | INTRAMUSCULAR | Status: AC
  Administered 2015-05-24: 10 mg via INTRAVENOUS
  Filled 2015-05-23: qty 1

## 2015-05-23 MED ORDER — DEXAMETHASONE SODIUM PHOSPHATE 10 MG/ML IJ SOLN: 10.0000 mg | Freq: Once | INTRAMUSCULAR | Status: DC

## 2015-05-23 MED ORDER — PHENOL 1.4 % MT LIQD: 1.0000 | OROMUCOSAL | Status: DC | PRN

## 2015-05-23 MED ORDER — METOCLOPRAMIDE HCL 5 MG/ML IJ SOLN
5.0000 mg | Freq: Three times a day (TID) | INTRAMUSCULAR | Status: DC | PRN
Start: 2015-05-23 — End: 2015-05-25

## 2015-05-23 MED ORDER — LACTATED RINGERS IV SOLN
INTRAVENOUS | Status: DC | PRN
  Administered 2015-05-23 (×2): via INTRAVENOUS

## 2015-05-23 MED ORDER — MIDAZOLAM HCL 2 MG/2ML IJ SOLN
INTRAMUSCULAR | Status: AC
  Filled 2015-05-23: qty 2

## 2015-05-23 MED ORDER — PROMETHAZINE HCL 25 MG/ML IJ SOLN: 6.2500 mg | INTRAMUSCULAR | Status: DC | PRN

## 2015-05-23 MED ORDER — CEFAZOLIN SODIUM-DEXTROSE 2-3 GM-% IV SOLR
2.0000 g | Freq: Four times a day (QID) | INTRAVENOUS | Status: AC
  Administered 2015-05-23 – 2015-05-24 (×2): 2 g via INTRAVENOUS
  Filled 2015-05-23 (×2): qty 50

## 2015-05-23 MED ORDER — ACETAMINOPHEN 10 MG/ML IV SOLN
INTRAVENOUS | Status: AC
  Filled 2015-05-23: qty 100

## 2015-05-23 MED ORDER — EPHEDRINE SULFATE 50 MG/ML IJ SOLN
INTRAMUSCULAR | Status: DC | PRN
  Administered 2015-05-23: 5 mg via INTRAVENOUS

## 2015-05-23 MED ORDER — ONDANSETRON HCL 4 MG/2ML IJ SOLN
INTRAMUSCULAR | Status: DC | PRN
  Administered 2015-05-23 (×2): 2 mg via INTRAVENOUS

## 2015-05-23 MED ORDER — BUPIVACAINE HCL (PF) 0.25 % IJ SOLN
INTRAMUSCULAR | Status: AC
  Filled 2015-05-23: qty 30

## 2015-05-23 MED ORDER — LEVETIRACETAM 250 MG PO TABS
250.0000 mg | ORAL_TABLET | Freq: Every morning | ORAL | Status: DC
  Administered 2015-05-24 – 2015-05-25 (×2): 250 mg via ORAL
  Filled 2015-05-23 (×2): qty 1

## 2015-05-23 MED ORDER — PANTOPRAZOLE SODIUM 40 MG PO TBEC
80.0000 mg | DELAYED_RELEASE_TABLET | Freq: Every day | ORAL | Status: DC
  Administered 2015-05-23: 80 mg via ORAL
  Filled 2015-05-23 (×2): qty 2

## 2015-05-23 MED ORDER — ARIPIPRAZOLE 2 MG PO TABS
2.0000 mg | ORAL_TABLET | Freq: Every day | ORAL | Status: DC
  Administered 2015-05-23 – 2015-05-24 (×2): 2 mg via ORAL
  Filled 2015-05-23 (×3): qty 1

## 2015-05-23 MED ORDER — BUPIVACAINE HCL (PF) 0.25 % IJ SOLN
INTRAMUSCULAR | Status: DC | PRN
  Administered 2015-05-23: 20 mL

## 2015-05-23 MED ORDER — LIDOCAINE HCL (CARDIAC) 20 MG/ML IV SOLN
INTRAVENOUS | Status: DC | PRN
Start: 2015-05-23 — End: 2015-05-23
  Administered 2015-05-23: 75 mg via INTRAVENOUS

## 2015-05-23 MED ORDER — KETOROLAC TROMETHAMINE 15 MG/ML IJ SOLN: 7.5000 mg | Freq: Four times a day (QID) | INTRAMUSCULAR | Status: AC | PRN

## 2015-05-23 MED ORDER — CEFAZOLIN SODIUM-DEXTROSE 2-3 GM-% IV SOLR
INTRAVENOUS | Status: AC
  Filled 2015-05-23: qty 50

## 2015-05-23 MED ORDER — BISACODYL 10 MG RE SUPP: 10.0000 mg | Freq: Every day | RECTAL | Status: DC | PRN

## 2015-05-23 MED ORDER — SODIUM CHLORIDE 0.9 % IV SOLN
2000.0000 mg | Freq: Once | INTRAVENOUS | Status: DC
  Filled 2015-05-23: qty 20

## 2015-05-23 MED ORDER — ACETAMINOPHEN 500 MG PO TABS
1000.0000 mg | ORAL_TABLET | Freq: Four times a day (QID) | ORAL | Status: AC
  Administered 2015-05-23 – 2015-05-24 (×4): 1000 mg via ORAL
  Filled 2015-05-23 (×4): qty 2

## 2015-05-23 MED ORDER — FLEET ENEMA 7-19 GM/118ML RE ENEM: 1.0000 | ENEMA | Freq: Once | RECTAL | Status: AC | PRN

## 2015-05-23 MED ORDER — RIVAROXABAN 10 MG PO TABS
10.0000 mg | ORAL_TABLET | Freq: Every day | ORAL | Status: DC
  Administered 2015-05-24 – 2015-05-25 (×2): 10 mg via ORAL
  Filled 2015-05-23 (×3): qty 1

## 2015-05-23 MED ORDER — DIPHENHYDRAMINE HCL 12.5 MG/5ML PO ELIX: 12.5000 mg | ORAL_SOLUTION | ORAL | Status: DC | PRN

## 2015-05-23 MED ORDER — BUDESONIDE-FORMOTEROL FUMARATE 160-4.5 MCG/ACT IN AERO
2.0000 | INHALATION_SPRAY | Freq: Every day | RESPIRATORY_TRACT | Status: DC
  Filled 2015-05-23: qty 6

## 2015-05-23 MED ORDER — LAMOTRIGINE 25 MG PO TABS
225.0000 mg | ORAL_TABLET | Freq: Every morning | ORAL | Status: DC
  Administered 2015-05-24 – 2015-05-25 (×2): 225 mg via ORAL
  Filled 2015-05-23 (×2): qty 1

## 2015-05-23 MED ORDER — CLONAZEPAM 1 MG PO TABS
1.0000 mg | ORAL_TABLET | Freq: Two times a day (BID) | ORAL | Status: DC
  Administered 2015-05-23 – 2015-05-25 (×4): 1 mg via ORAL
  Filled 2015-05-23 (×4): qty 1

## 2015-05-23 SURGICAL SUPPLY — 43 items
BAG DECANTER FOR FLEXI CONT (MISCELLANEOUS) ×3 IMPLANT
BAG SPEC THK2 15X12 ZIP CLS (MISCELLANEOUS)
BAG ZIPLOCK 12X15 (MISCELLANEOUS) IMPLANT
BLADE EXTENDED COATED 6.5IN (ELECTRODE) ×3 IMPLANT
BLADE SAG 18X100X1.27 (BLADE) ×3 IMPLANT
CAPT HIP TOTAL 2 ×2 IMPLANT
CLOSURE WOUND 1/2 X4 (GAUZE/BANDAGES/DRESSINGS) ×2
COVER PERINEAL POST (MISCELLANEOUS) ×3 IMPLANT
DECANTER SPIKE VIAL GLASS SM (MISCELLANEOUS) ×3 IMPLANT
DRAPE C-ARM 42X120 X-RAY (DRAPES) ×3 IMPLANT
DRAPE STERI IOBAN 125X83 (DRAPES) ×3 IMPLANT
DRAPE U-SHAPE 47X51 STRL (DRAPES) ×9 IMPLANT
DRSG ADAPTIC 3X8 NADH LF (GAUZE/BANDAGES/DRESSINGS) ×3 IMPLANT
DRSG MEPILEX BORDER 4X4 (GAUZE/BANDAGES/DRESSINGS) ×3 IMPLANT
DRSG MEPILEX BORDER 4X8 (GAUZE/BANDAGES/DRESSINGS) ×3 IMPLANT
DURAPREP 26ML APPLICATOR (WOUND CARE) ×3 IMPLANT
ELECT REM PT RETURN 9FT ADLT (ELECTROSURGICAL) ×3
ELECTRODE REM PT RTRN 9FT ADLT (ELECTROSURGICAL) ×1 IMPLANT
EVACUATOR 1/8 PVC DRAIN (DRAIN) ×3 IMPLANT
FACESHIELD WRAPAROUND (MASK) ×12 IMPLANT
FACESHIELD WRAPAROUND OR TEAM (MASK) ×4 IMPLANT
GLOVE BIO SURGEON STRL SZ7.5 (GLOVE) ×3 IMPLANT
GLOVE BIO SURGEON STRL SZ8 (GLOVE) ×6 IMPLANT
GLOVE BIOGEL PI IND STRL 8 (GLOVE) ×2 IMPLANT
GLOVE BIOGEL PI INDICATOR 8 (GLOVE) ×4
GOWN STRL REUS W/TWL LRG LVL3 (GOWN DISPOSABLE) ×3 IMPLANT
GOWN STRL REUS W/TWL XL LVL3 (GOWN DISPOSABLE) ×3 IMPLANT
KIT BASIN OR (CUSTOM PROCEDURE TRAY) ×3 IMPLANT
NDL SAFETY ECLIPSE 18X1.5 (NEEDLE) ×1 IMPLANT
NEEDLE HYPO 18GX1.5 SHARP (NEEDLE) ×3
PACK TOTAL JOINT (CUSTOM PROCEDURE TRAY) ×3 IMPLANT
PEN SKIN MARKING BROAD (MISCELLANEOUS) ×3 IMPLANT
STRIP CLOSURE SKIN 1/2X4 (GAUZE/BANDAGES/DRESSINGS) ×3 IMPLANT
SUT ETHIBOND NAB CT1 #1 30IN (SUTURE) ×3 IMPLANT
SUT MNCRL AB 4-0 PS2 18 (SUTURE) ×3 IMPLANT
SUT VIC AB 2-0 CT1 27 (SUTURE) ×6
SUT VIC AB 2-0 CT1 TAPERPNT 27 (SUTURE) ×2 IMPLANT
SUT VLOC 180 0 24IN GS25 (SUTURE) ×3 IMPLANT
SYR 30ML LL (SYRINGE) ×3 IMPLANT
SYR 50ML LL SCALE MARK (SYRINGE) IMPLANT
TOWEL OR 17X26 10 PK STRL BLUE (TOWEL DISPOSABLE) ×3 IMPLANT
TOWEL OR NON WOVEN STRL DISP B (DISPOSABLE) IMPLANT
TRAY FOLEY W/METER SILVER 14FR (SET/KITS/TRAYS/PACK) ×3 IMPLANT

## 2015-05-23 NOTE — Transfer of Care (Signed)
Immediate Anesthesia Transfer of Care Note  Patient: Sheryl Hall  Procedure(s) Performed: Procedure(s): RIGHT TOTAL HIP ARTHROPLASTY ANTERIOR APPROACH (Right)  Patient Location: PACU  Anesthesia Type:General  Level of Consciousness: awake, oriented, patient cooperative, lethargic and responds to stimulation  Airway & Oxygen Therapy: Patient Spontanous Breathing and Patient connected to face mask oxygen  Post-op Assessment: Report given to RN, Post -op Vital signs reviewed and stable and Patient moving all extremities  Post vital signs: Reviewed and stable  Last Vitals:  Filed Vitals:   05/23/15 1259  BP: 111/70  Pulse: 69  Temp: 36.4 C  Resp: 18    Complications: No apparent anesthesia complications

## 2015-05-23 NOTE — H&P (View-Only) (Signed)
Sheryl Hall DOB: October 02, 1961 Divorced / Language: Cleophus Molt / Race: White Female Date of Admission:  05/23/2015 CC:  Right Hip Pain History of Present Illness The patient is a 54 year old female who comes in for a preoperative History and Physical. The patient is scheduled for a right total hip arthroplasty (anterior) to be performed by Dr. Dione Plover. Aluisio, MD at [facility] on 05-23-2015. The patient is a 54 year old female who is being followed for their right hip pain and osteoarthritis. Symptoms reported  include: pain, aching, popping and grinding. The patient feels that they are doing poorly and report their pain level to be moderate. The following medication has been used for pain control: none. The patient has recently underwent an MRI of the right hip. She states the pain is worsening. The pain in her right hip is very similar to what she experienced in the left hip prior to when we had to fix that. It is hurting her at most times. It is starting to limit what she can and cannot do. I reviewed her MRI scan. She does have high-grade osteonecrosis of the right hip and now has some femoral head collapse. At this point the most predictable means of getting her better is going to be total hip arthroplasty. She did extremely well on the left side; I anticipate she will do just as well on this side. We did discuss it in detail, and she wants to proceed. They have been treated conservatively in the past for the above stated problem and despite conservative measures, they continue to have progressive pain and severe functional limitations and dysfunction. They have failed non-operative management including home exercise, medications. It is felt that they would benefit from undergoing total joint replacement. Risks and benefits of the procedure have been discussed with the patient and they elect to proceed with surgery. There are no active contraindications to surgery such as ongoing infection or  rapidly progressive neurological disease.   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 Cancer Father. Chronic Obstructive Lung Disease Father. Kidney disease Paternal Grandfather. Heart disease in female family member before age 55 Depression Father. Osteoporosis Mother. Heart Disease Brother, Maternal Grandfather, Mother, Paternal Grandmother. Heart disease in female family member before age 43 First Degree Relatives reported Congestive Heart Failure Maternal Grandmother, Paternal Grandmother. Cerebrovascular Accident Paternal Grandmother.  Social History Living situation live alone Children 1 Tobacco use Current every day smoker. 01/05/2014: smoke(d) 1/2 pack(s) per day Exercise Exercises never Current work status disabled Former drinker 01/05/2014: In the past drank beer only occasionally per week No history of drug/alcohol rehab Marital status divorced Number of flights of stairs before winded less than 1 Not under pain contract Tobacco / smoke exposure 01/05/2014: yes Post-Surgical Plans Home  Medication History  Abilify (2MG  Tablet, Oral every morning) Active. LamoTRIgine (25MG  Tablet, Oral) Active. Probiotic (Oral) Active. Aspirin EC (81MG  Tablet DR, Oral every morning) Active. Folic Acid (761YWV Tablet, Oral every morning) Active. LamoTRIgine (225mg  Oral every morning) Specific dose unknown - Active. TraZODone HCl (100MG  Tablet, Oral at bedtime) Active. LevETIRAcetam (500MG  Tablet, Oral at bedtime) Active. (250mg  in the morning) ClonazePAM (1MG  Tablet, Oral two times daily)  Active. Levothyroxine Sodium (50MCG Tablet, Oral  every morning) Active. Dexilant (60MG  Capsule DR, Oral at bedtime) Active. Symbicort (160-4.5MCG/ACT Aerosol, Inhalation) Active. ValACYclovir HCl (1GM Tablet, Oral) Active.   Past Surgical History  Spinal Fusion neck Neck Disc Surgery Ear Surgery Date: 1995. Total Hip Replacement - Left Date: 03/2014.   Seizure Disorder Depression Gastroesophageal Reflux Disease Irritable bowel syndrome Chronic Pain Cerebrovascular Accident approx 3 years ago Anxiety Disorder Psychiatric disorder Urinary Tract Infection Heart murmur   Review of Systems General Present- Fatigue. Not Present- Chills, Fever, Memory Loss, Night Sweats, Weight Gain and Weight Loss. Skin Not Present- Eczema, Hives, Itching, Lesions and Rash. HEENT Not Present- Dentures, Double Vision, Headache, Hearing Loss, Tinnitus and Visual Loss. Respiratory Not Present- Allergies, Chronic Cough, Coughing up blood, Shortness of breath at rest and Shortness of breath with exertion. Cardiovascular Not Present- Chest Pain, Difficulty Breathing Lying Down, Murmur, Palpitations, Racing/skipping heartbeats and Swelling. Gastrointestinal Not Present- Abdominal Pain, Bloody Stool, Constipation, Diarrhea, Difficulty Swallowing, Heartburn, Jaundice, Loss of appetitie, Nausea and Vomiting. Female Genitourinary Not Present- Blood in Urine, Discharge, Flank Pain, Incontinence, Painful Urination, Urgency, Urinary frequency, Urinary Retention, Urinating at Night and Weak urinary stream. Musculoskeletal Present- Joint Pain. Not Present- Back Pain, Joint Swelling, Morning Stiffness, Muscle Pain, Muscle Weakness and Spasms. Neurological Not Present- Blackout spells, Difficulty with balance, Dizziness, Paralysis, Tremor and Weakness. Psychiatric Not Present- Insomnia.  Vitals Weight: 154 lb Height: 66in Weight was reported by patient. Height was reported by patient. Body  Surface Area: 1.79 m Body Mass Index: 24.86 kg/m  BP: 128/64 (Sitting, Right Arm, Standard)   Physical Exam General Mental Status -Alert, cooperative and good historian. General Appearance-pleasant, Not in acute distress. Orientation-Oriented X3. Build & Nutrition-Well nourished and Well developed.  Head and Neck Head-normocephalic, atraumatic . Neck Global Assessment - supple, no bruit auscultated on the right, no bruit auscultated on the left.  Eye Pupil - Bilateral-Regular and Round. Motion - Bilateral-EOMI.  ENMT Note: partial lower denture plate  Chest and Lung Exam Auscultation Breath sounds - clear at anterior chest wall and clear at posterior chest wall. Adventitious sounds - No Adventitious sounds.  Cardiovascular Auscultation Rhythm - Regular rate and rhythm. Heart Sounds - S1 WNL and S2 WNL. Murmurs & Other Heart Sounds: Murmur 1 - Location - Aortic Area. Timing - Early systolic. Grade - II/VI.  Abdomen Palpation/Percussion Tenderness - Abdomen is non-tender to palpation. Rigidity (guarding) - Abdomen is soft. Auscultation Auscultation of the abdomen reveals - Bowel sounds normal.  Female Genitourinary Note: Not done, not pertinent to present illness   Musculoskeletal Note: On exam she is in no distress. Her right hip can be flexed to about 100, rotated in 20 and out 30, abducted 30 with discomfort. Left hip flexion to 130, rotation in 30 and out 40, abduction 40 without discomfort.  IMAGING MRI scan - she has high-grade osteonecrosis of the right hip and now has some femoral head collapse.  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 Dennard Schaumann - Patient has been seen preoperatively and felt to be stable for surgery.  Topical TXA -  History of Stroke  Anesthesia Issues: None  Signed electronically by Ok Edwards, III PA-C

## 2015-05-23 NOTE — Anesthesia Postprocedure Evaluation (Addendum)
  Anesthesia Post-op Note  Patient: Sheryl Hall  Procedure(s) Performed: Procedure(s) (LRB): RIGHT TOTAL HIP ARTHROPLASTY ANTERIOR APPROACH (Right)  Patient Location: PACU  Anesthesia Type: general  Level of Consciousness: awake and alert   Airway and Oxygen Therapy: Patient Spontanous Breathing  Post-op Pain: mild  Post-op Assessment: Post-op Vital signs reviewed, Patient's Cardiovascular Status Stable, Respiratory Function Stable, Patent Airway and No signs of Nausea or vomiting  Last Vitals:  Filed Vitals:   05/23/15 1630  BP: 117/62  Pulse: 72  Temp:   Resp: 15    Post-op Vital Signs: stable   Complications: No apparent anesthesia complications

## 2015-05-23 NOTE — Interval H&P Note (Signed)
History and Physical Interval Note:  05/23/2015 2:39 PM  Sheryl Hall  has presented today for surgery, with the diagnosis of AVASCULAR NECROSIS OF RIGHT HIP  The various methods of treatment have been discussed with the patient and family. After consideration of risks, benefits and other options for treatment, the patient has consented to  Procedure(s): RIGHT TOTAL HIP ARTHROPLASTY ANTERIOR APPROACH (Right) as a surgical intervention .  The patient's history has been reviewed, patient examined, no change in status, stable for surgery.  I have reviewed the patient's chart and labs.  Questions were answered to the patient's satisfaction.     [name]

## 2015-05-23 NOTE — Op Note (Signed)
OPERATIVE REPORT  PREOPERATIVE DIAGNOSIS: Avascular necrosis of the Right hip.   POSTOPERATIVE DIAGNOSIS: Avascular necrosis of the Right  hip.   PROCEDURE: Right total hip arthroplasty, anterior approach.   SURGEON: Gaynelle Arabian, MD   ASSISTANT: Arlee Muslim, PA-C  ANESTHESIA:  General  ESTIMATED BLOOD LOSS:- 200 ml   DRAINS: Hemovac x1.   COMPLICATIONS: None   CONDITION: PACU - hemodynamically stable.   BRIEF CLINICAL NOTE: Sheryl Hall is a 54 y.o. female who has advanced end-  stage osteonecrosis of her Right  hip with progressively worsening pain and  dysfunction.The patient has failed nonoperative management and presents for  total hip arthroplasty.   PROCEDURE IN DETAIL: After successful administration of spinal  anesthetic, the traction boots for the [facility] bed were placed on both  feet and the patient was placed onto the [facility] bed, boots placed into the leg  holders. The Right hip was then isolated from the perineum with plastic  drapes and prepped and draped in the usual sterile fashion. ASIS and  greater trochanter were marked and a oblique incision was made, starting  at about 1 cm lateral and 2 cm distal to the ASIS and coursing towards  the anterior cortex of the femur. The skin was cut with a 10 blade  through subcutaneous tissue to the level of the fascia overlying the  tensor fascia lata muscle. The fascia was then incised in line with the  incision at the junction of the anterior third and posterior 2/3rd. The  muscle was teased off the fascia and then the interval between the TFL  and the rectus was developed. The Hohmann retractor was then placed at  the top of the femoral neck over the capsule. The vessels overlying the  capsule were cauterized and the fat on top of the capsule was removed.  A Hohmann retractor was then placed anterior underneath the rectus  femoris to give exposure to the entire anterior capsule. A T-shaped  capsulotomy  was performed. The edges were tagged and the femoral head  was identified.       Osteophytes are removed off the superior acetabulum.  The femoral neck was then cut in situ with an oscillating saw. Traction  was then applied to the left lower extremity utilizing the [facility]  traction. The femoral head was then removed. Retractors were placed  around the acetabulum and then circumferential removal of the labrum was  performed. Osteophytes were also removed. Reaming starts at 45 mm to  medialize and  Increased in 2 mm increments to 49 mm. We reamed in  approximately 40 degrees of abduction, 20 degrees anteversion. A 50 mm  pinnacle acetabular shell was then impacted in anatomic position under  fluoroscopic guidance with excellent purchase. We did not need to place  any additional dome screws. A 32 mm Neutral + 4 marathon liner was then  placed into the acetabular shell.       The femoral lift was then placed along the lateral aspect of the femur  just distal to the vastus ridge. The leg was  externally rotated and capsule  was stripped off the inferior aspect of the femoral neck down to the  level of the lesser trochanter, this was done with electrocautery. The femur was lifted after this was performed. The  leg was then placed and extended in adducted position to essentially delivering the femur. We also removed the capsule superiorly and the  piriformis  from the piriformis fossa to gain excellent exposure of the  proximal femur. Rongeur was used to remove some cancellous bone to get  into the lateral portion of the proximal femur for placement of the  initial starter reamer. The starter broaches was placed  the starter broach  and was shown to go down the center of the canal. Broaching  with the  Corail system was then performed starting at size 8, coursing  Up to size 11. A size 11 had excellent torsional and rotational  and axial stability. The trial standard offset neck was then placed   with a 32 + 1 trial head. The hip was then reduced. We confirmed that  the stem was in the canal both on AP and lateral x-rays. It also has excellent sizing. The hip was reduced with outstanding stability through full extension, full external rotation,  and then flexion in adduction internal rotation. AP pelvis was taken  and the leg lengths were measured and found to be exactly equal. Hip  was then dislocated again and the femoral head and neck removed. The  femoral broach was removed. Size 11 Corail stem with a standard offset  neck was then impacted into the femur following native anteversion. Has  excellent purchase in the canal. Excellent torsional and rotational and  axial stability. It is confirmed to be in the canal on AP and lateral  fluoroscopic views. The 32 + 1 ceramic head was placed and the hip  reduced with outstanding stability. Again AP pelvis was taken and it  confirmed that the leg lengths were equal. The wound was then copiously  irrigated with saline solution and the capsule reattached and repaired  with Ethibond suture.  20 mL of Exparel mixed with 50 mL of saline then additional 20 ml of .25% Bupivicaine injected into the capsule and into the edge of the tensor fascia lata as well as subcutaneous tissue. The fascia overlying the tensor fascia lata was  then closed with a running #1 V-Loc. Subcu was closed with interrupted  2-0 Vicryl and subcuticular running 4-0 Monocryl. Incision was cleaned  and dried. Steri-Strips and a bulky sterile dressing applied. Hemovac  drain was hooked to suction and then he was awakened and transported to  recovery in stable condition.        Please note that a surgical assistant was a medical necessity for this procedure to perform it in a safe and expeditious manner. Assistant was necessary to provide appropriate retraction of vital neurovascular structures and to prevent femoral fracture and allow for anatomic placement of the  prosthesis.  Gaynelle Arabian, M.D.

## 2015-05-23 NOTE — Anesthesia Preprocedure Evaluation (Signed)
Anesthesia Evaluation  Patient identified by MRN, date of birth, ID band Patient awake    Reviewed: Allergy & Precautions, H&P , NPO status , Patient's Chart, lab work & pertinent test results  Airway Mallampati: II  TM Distance: >3 FB Neck ROM: full    Dental no notable dental hx. (+) Teeth Intact, Dental Advisory Given   Pulmonary neg pulmonary ROS, Current Smoker,  breath sounds clear to auscultation  Pulmonary exam normal       Cardiovascular Exercise Tolerance: Good negative cardio ROS  Rhythm:regular Rate:Normal     Neuro/Psych  Headaches, Seizures -,  Anxiety Depression Bipolar Disorder 2014 right parietal stroke. 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, Residual Symptoms negative psych ROS   GI/Hepatic negative GI ROS, Neg liver ROS, GERD-  Medicated and Controlled,  Endo/Other  Hyperthyroidism   Renal/GU negative Renal ROS  negative genitourinary   Musculoskeletal   Abdominal   Peds  Hematology negative hematology ROS (+)   Anesthesia Other Findings   Reproductive/Obstetrics negative OB ROS                             Anesthesia Physical  Anesthesia Plan  ASA: II  Anesthesia Plan: General   Post-op Pain Management:    Induction: Intravenous  Airway Management Planned: Oral ETT  Additional Equipment:   Intra-op Plan:   Post-operative Plan: Extubation in OR  Informed Consent: I have reviewed the patients History and Physical, chart, labs and discussed the procedure including the risks, benefits and alternatives for the proposed anesthesia with the patient or authorized representative who has indicated his/her understanding and acceptance.   Dental Advisory Given  Plan Discussed with: CRNA and Surgeon  Anesthesia Plan Comments:         Anesthesia Quick Evaluation

## 2015-05-23 NOTE — Anesthesia Procedure Notes (Signed)
Procedure Name: Intubation Date/Time: 05/23/2015 3:52 PM Performed by: Ofilia Neas Pre-anesthesia Checklist: Patient identified, Timeout performed, Emergency Drugs available, Suction available and Patient being monitored Patient Re-evaluated:Patient Re-evaluated prior to inductionOxygen Delivery Method: Circle system utilized Preoxygenation: Pre-oxygenation with 100% oxygen Intubation Type: IV induction Ventilation: Mask ventilation without difficulty Laryngoscope Size: Mac and 4 Grade View: Grade II Tube type: Oral Tube size: 7.5 mm Number of attempts: 1 Airway Equipment and Method: Stylet Secured at: 21 cm Tube secured with: Tape Dental Injury: Teeth and Oropharynx as per pre-operative assessment

## 2015-05-24 ENCOUNTER — Encounter (HOSPITAL_COMMUNITY): Payer: Self-pay | Admitting: Orthopedic Surgery

## 2015-05-24 LAB — BASIC METABOLIC PANEL
Anion gap: 7 (ref 5–15)
BUN: 7 mg/dL (ref 6–20)
CO2: 25 mmol/L (ref 22–32)
Calcium: 8.6 mg/dL — ABNORMAL LOW (ref 8.9–10.3)
Chloride: 107 mmol/L (ref 101–111)
Creatinine, Ser: 0.99 mg/dL (ref 0.44–1.00)
GFR calc Af Amer: >60 mL/min (ref >60)
Glucose, Bld: 167 mg/dL — ABNORMAL HIGH (ref 65–99)
POTASSIUM: 4.2 mmol/L (ref 3.5–5.1)
Sodium: 139 mmol/L (ref 135–145)

## 2015-05-24 LAB — CBC
HEMATOCRIT: 34.4 % — AB (ref 36.0–46.0)
Hemoglobin: 11.3 g/dL — ABNORMAL LOW (ref 12.0–15.0)
MCH: 29 pg (ref 26.0–34.0)
MCHC: 32.8 g/dL (ref 30.0–36.0)
MCV: 88.4 fL (ref 78.0–100.0)
Platelets: 184 10*3/uL (ref 150–400)
RBC: 3.89 MIL/uL (ref 3.87–5.11)
RDW: 13.1 % (ref 11.5–15.5)
WBC: 11.8 10*3/uL — ABNORMAL HIGH (ref 4.0–10.5)

## 2015-05-24 MED ORDER — SODIUM CHLORIDE 0.9 % IV BOLUS (SEPSIS)
250.0000 mL | Freq: Once | INTRAVENOUS | Status: AC
  Administered 2015-05-24: 250 mL via INTRAVENOUS

## 2015-05-24 MED ORDER — DEXLANSOPRAZOLE 60 MG PO CPDR
60.0000 mg | DELAYED_RELEASE_CAPSULE | Freq: Every day | ORAL | Status: DC
  Administered 2015-05-24: 60 mg via ORAL
  Filled 2015-05-24 (×2): qty 1

## 2015-05-24 NOTE — Care Management Note (Signed)
Case Management Note  Patient Details  Name: HEAVENLEIGH PETRUZZI MRN: [id number] Date of Birth: 1961-04-17  Subjective/Objective:                   RIGHT TOTAL HIP ARTHROPLASTY ANTERIOR APPROACH (Right) Action/Plan:  Discharge planning Expected Discharge Date:  05/25/15               Expected Discharge Plan:  [place]  In-House Referral:     Discharge planning Services  CM Consult  Post Acute Care Choice:  Home Health Choice offered to:  Patient  DME Arranged:    DME Agency:     HH Arranged:  PT HH Agency:  [address]  Status of Service:  Completed, signed off  Medicare Important Message Given:    Date Medicare IM Given:    Medicare IM give by:    Date Additional Medicare IM Given:    Additional Medicare Important Message give by:     If discussed at [place] of Stay Meetings, dates discussed:    Additional Comments: CM met with pt in room to offer choice of home health agency.  Pt chooses [facility] to render HHPT.  Pt has both a rolling walker and 3n1 at home.  Address and contact information verified by pt.  Referral given to [facility] rep, Tim, for HHPT.  No other CM needs were communicated. Dellie Catholic, RN 05/24/2015, 2:13 PM

## 2015-05-24 NOTE — Progress Notes (Signed)
Physical Therapy Treatment Patient Details Name: Sheryl Hall MRN: [id number] DOB: Mar 24, 1961 Today's Date: 2015/05/30    History of Present Illness R THR; s/p L THR 5/15    PT Comments    Steady progress with mobility.  Pt attempting to arrange assist at home for potential dc tomorrow.  Follow Up Recommendations  Home health PT     Equipment Recommendations  None recommended by PT    Recommendations for Other Services OT consult     Precautions / Restrictions Precautions Precautions: Fall Restrictions Weight Bearing Restrictions: Yes RLE Weight Bearing: Weight bearing as tolerated    Mobility  Bed Mobility         Supine to sit: Modified independent (Device/Increase time)     General bed mobility comments: Pt declines back to bed  Transfers Overall transfer level: Needs assistance Equipment used: Rolling walker (2 wheeled) Transfers: Sit to/from Stand Sit to Stand: Supervision         General transfer comment: cues for UE placement  Ambulation/Gait Ambulation/Gait assistance: Min guard;Supervision Ambulation Distance (Feet): 350 Feet Assistive device: Rolling walker (2 wheeled) Gait Pattern/deviations: Step-to pattern;Step-through pattern;Shuffle;Trunk flexed     General Gait Details: cues for posture, position from RW, ER on R and initial sequence   Stairs            Wheelchair Mobility    Modified Rankin (Stroke Patients Only)       Balance                                    Cognition Arousal/Alertness: Awake/alert Behavior During Therapy: WFL for tasks assessed/performed Overall Cognitive Status: Within Functional Limits for tasks assessed                      Exercises      General Comments        Pertinent Vitals/Pain Pain Assessment: 0-10 Pain Score: 5  Pain Location: R hip Pain Descriptors / Indicators: Aching;Burning Pain Intervention(s): Limited activity within patient's  tolerance;Monitored during session;Premedicated before session;Ice applied    Home Living Family/patient expects to be discharged to:: Private residence Living Arrangements: Alone Available Help at Discharge: Family         Home Equipment: Toilet riser;Walker - 2 wheels;Crutches;Cane - single point Additional Comments: Sister can assist starting Saturday     Prior Function Level of Independence: Independent          PT Goals (current goals can now be found in the care plan section) Acute Rehab PT Goals Patient Stated Goal: walk without pain PT Goal Formulation: With patient Time For Goal Achievement: 05/29/15 Potential to Achieve Goals: Good Progress towards PT goals: Progressing toward goals    Frequency  7X/week    PT Plan Current plan remains appropriate    Co-evaluation             End of Session Equipment Utilized During Treatment: Gait belt Activity Tolerance: Patient tolerated treatment well Patient left: in chair;with call bell/phone within reach;with family/visitor present     Time: 8466-5993 PT Time Calculation (min) (ACUTE ONLY): 16 min  Charges:  $Gait Training: 8-22 mins                    G Codes:      Exodus Kutzer 30-May-2015, 5:27 PM

## 2015-05-24 NOTE — Progress Notes (Signed)
Utilization review completed.  

## 2015-05-24 NOTE — Progress Notes (Signed)
Subjective: 1 Day Post-Op Procedure(s) (LRB): RIGHT TOTAL HIP ARTHROPLASTY ANTERIOR APPROACH (Right) Patient reports pain as mild.   Patient seen in rounds with Dr. Wynelle Link.  Sitting up eating breakfast. Patient is well, but has had some minor complaints of pain in the hip, requiring pain medications We will start therapy today.  Plan is to go Home after hospital stay. Probably home tomorrow.  Objective: Vital signs in last 24 hours: Temp:  [97 F (36.1 C)-98.4 F (36.9 C)] 98.4 F (36.9 C) (06/30 0615) Pulse Rate:  [56-74] 61 (06/30 0615) Resp:  [11-18] 16 (06/30 0615) BP: (95-143)/(48-73) 95/49 mmHg (06/30 0615) SpO2:  [100 %] 100 % (06/30 0615) Weight:  [70.308 kg (155 lb)] 70.308 kg (155 lb) (06/29 1300)  Intake/Output from previous day:  Intake/Output Summary (Last 24 hours) at 05/24/15 0746 Last data filed at 05/24/15 8921  Gross per 24 hour  Intake   4860 ml  Output   2915 ml  Net   1945 ml    Intake/Output this shift: UOP over 1000 since around [place]  Labs:  Recent Labs  05/24/15 0528  HGB 11.3*    Recent Labs  05/24/15 0528  WBC 11.8*  RBC 3.89  HCT 34.4*  PLT 184    Recent Labs  05/24/15 0528  NA 139  K 4.2  CL 107  CO2 25  BUN 7  CREATININE 0.99  GLUCOSE 167*  CALCIUM 8.6*   No results for input(s): LABPT, INR in the last 72 hours.  EXAM General - Patient is Alert, Appropriate and Oriented Extremity - Neurovascular intact Sensation intact distally Dorsiflexion/Plantar flexion intact Dressing - dressing C/D/I Motor Function - intact, moving foot and toes well on exam.  Hemovac pulled without difficulty.  Past Medical History  Diagnosis Date  . PTSD (post-traumatic stress disorder)   . Bipolar 1 disorder   . Heart murmur   . Diverticulosis of colon   . Keratosis, actinic   . Family hx of colon cancer     age 65-father  . GERD (gastroesophageal reflux disease)   . Chronic nausea   . S/P endoscopy 05/30/2010    Dr Rourk->  non--critical Schatzki's ring, s/p 61F dilation  . S/P colonoscopy 05/30/2010    LAX sphincter tone, anal papilla, left-sided diverticulosis, normal random biopsies,, 1 polyp-TA  . Polysubstance abuse   . Tubular adenoma of colon 05/30/2010    Next colonoscopy 05/2015  . Anxiety   . Depression   . Hyperthyroidism   . Hyperlipidemia   . Folate deficiency   . Hx of abnormal Pap smear   . Low back pain 03/01/2013    MRI with multiple levels of disc bulge  . Chronic left hip pain   . Avascular necrosis of hip     left  . UTI (urinary tract infection)   . Stroke 2014    Right parietal, no deficits   . Arthritis     Assessment/Plan: 1 Day Post-Op Procedure(s) (LRB): RIGHT TOTAL HIP ARTHROPLASTY ANTERIOR APPROACH (Right) Principal Problem:   Avascular necrosis of femur head, right Active Problems:   OA (osteoarthritis) of hip  Estimated body mass index is 25.03 kg/(m^2) as calculated from the following:   Height as of this encounter: 5\' 6"  (1.676 m).   Weight as of this encounter: 70.308 kg (155 lb). Advance diet Up with therapy Plan for discharge tomorrow Discharge home with home health  DVT Prophylaxis - Xarelto Weight Bearing As Tolerated right Leg Hemovac Pulled Begin Therapy  Arlee Muslim, PA-C Orthopaedic Surgery 05/24/2015, 7:46 AM

## 2015-05-24 NOTE — Evaluation (Signed)
Physical Therapy Evaluation Patient Details Name: Sheryl Hall MRN: [id number] DOB: 1961-10-19 Today's Date: 05/24/2015   History of Present Illness  R THR; s/p L THR 5/15  Clinical Impression  Pt s/p R THR presents with decreased R LE strength/ROM and post op pain limiting functional mobility.  Pt should progress to dc home with family assist (24/7 starting 7/2 per pt) and HHPT follow up.    Follow Up Recommendations Home health PT    Equipment Recommendations  None recommended by PT    Recommendations for Other Services OT consult     Precautions / Restrictions Precautions Precautions: Fall Restrictions Weight Bearing Restrictions: No RLE Weight Bearing: Weight bearing as tolerated      Mobility  Bed Mobility Overal bed mobility: Needs Assistance Bed Mobility: Supine to Sit     Supine to sit: Min assist     General bed mobility comments: cues for sequence and use of L LE to self assist  Transfers Overall transfer level: Needs assistance Equipment used: Rolling walker (2 wheeled) Transfers: Sit to/from Stand Sit to Stand: Min assist         General transfer comment: cues for LE management and use of UEs to self assist  Ambulation/Gait Ambulation/Gait assistance: Min assist Ambulation Distance (Feet): 100 Feet (twice) Assistive device: Rolling walker (2 wheeled) Gait Pattern/deviations: Step-to pattern;Decreased step length - right;Decreased step length - left;Shuffle;Trunk flexed     General Gait Details: cues for posture, position from RW, ER on R and initial sequence  Stairs            Wheelchair Mobility    Modified Rankin (Stroke Patients Only)       Balance                                             Pertinent Vitals/Pain Pain Assessment: 0-10 Pain Score: 3  Pain Location: R hip Pain Descriptors / Indicators: Aching;Burning Pain Intervention(s): Limited activity within patient's tolerance;Monitored during  session;Premedicated before session;Ice applied    Home Living Family/patient expects to be discharged to:: Private residence Living Arrangements: Alone Available Help at Discharge: Family Type of Home: Mobile home Home Access: Ramped entrance     Home Layout: One level;Able to live on main level with bedroom/bathroom Home Equipment: Toilet riser;Walker - 2 wheels;Crutches;Cane - single point Additional Comments: Sister can assist starting Saturday     Prior Function Level of Independence: Independent;Independent with assistive device(s)               Hand Dominance        Extremity/Trunk Assessment   Upper Extremity Assessment: Overall WFL for tasks assessed           Lower Extremity Assessment: RLE deficits/detail RLE Deficits / Details: 3-/5 hip strength with AAROM at hip to 80 flex and 20 abd    Cervical / Trunk Assessment: Normal  Communication   Communication: No difficulties  Cognition Arousal/Alertness: Awake/alert Behavior During Therapy: WFL for tasks assessed/performed Overall Cognitive Status: Within Functional Limits for tasks assessed                      General Comments      Exercises Total Joint Exercises Ankle Circles/Pumps: AROM;Both;15 reps;Supine Quad Sets: AROM;Both;10 reps;Supine Heel Slides: AAROM;Right;20 reps;Supine Hip ABduction/ADduction: AAROM;15 reps;Supine;Right      Assessment/Plan    PT  Assessment Patient needs continued PT services  PT Diagnosis Difficulty walking   PT Problem List Decreased strength;Decreased range of motion;Decreased activity tolerance;Decreased mobility;Decreased knowledge of use of DME;Decreased safety awareness;Pain  PT Treatment Interventions DME instruction;Gait training;Stair training;Functional mobility training;Therapeutic activities;Therapeutic exercise;Patient/family education   PT Goals (Current goals can be found in the Care Plan section) Acute Rehab PT Goals Patient Stated  Goal: walk without pain PT Goal Formulation: With patient Time For Goal Achievement: 05/29/15 Potential to Achieve Goals: Good    Frequency 7X/week   Barriers to discharge Decreased caregiver support Pt has not assist at home until Saturday    Co-evaluation               End of Session Equipment Utilized During Treatment: Gait belt Activity Tolerance: Patient tolerated treatment well Patient left: in chair;with call bell/phone within reach Nurse Communication: Mobility status         Time: 7253-6644 PT Time Calculation (min) (ACUTE ONLY): 38 min   Charges:   PT Evaluation $Initial PT Evaluation Tier I: 1 Procedure PT Treatments $Gait Training: 8-22 mins $Therapeutic Exercise: 8-22 mins   PT G Codes:        Rumi Taras June 01, 2015, 12:10 PM

## 2015-05-24 NOTE — Evaluation (Signed)
Occupational Therapy Evaluation Patient Details Name: Sheryl Hall MRN: [id number] DOB: 1961/10/14 Today's Date: 05/24/2015    History of Present Illness R THR; s/p L THR 5/15   Clinical Impression   This 54 year old female was admitted for the above surgery.  Will follow in acute to further assess for tub bench and for safety and mod I level goal for toilet transfer.  Pt will have 24/7 starting Saturday    Follow Up Recommendations  Supervision/Assistance - 24 hour    Equipment Recommendations   (to be assessed further for tub bench)    Recommendations for Other Services       Precautions / Restrictions Precautions Precautions: Fall Restrictions RLE Weight Bearing: Weight bearing as tolerated      Mobility Bed Mobility         Supine to sit: Modified independent (Device/Increase time)     General bed mobility comments: HOB raised  Transfers   Equipment used: Rolling walker (2 wheeled) Transfers: Sit to/from Stand Sit to Stand: Supervision         General transfer comment: cues for UE placement    Balance                                            ADL Overall ADL's : Needs assistance/impaired     Grooming: Wash/dry hands;Supervision/safety;Standing   Upper Body Bathing: Set up;Sitting   Lower Body Bathing: Supervison/ safety;Sit to/from stand   Upper Body Dressing : Set up;Sitting   Lower Body Dressing: Supervision/safety;Sit to/from stand (donned socks from seated in bed, bringing leg into "frog" position)   Toilet Transfer: Min guard;Ambulation;BSC   Toileting- Water quality scientist and Hygiene: Supervision/safety;Sit to/from stand         General ADL Comments: pt will have assistance starting Saturday.  She is able to complete ADL with set up/supervision.  Cues given for safety when standing at sink for grooming, to turn walker with her.  She was able to use her walk in shower last sx, but floor has falled in.  she's  not sure if she feels comfortable with tub bench:  will bring on next visit.     Vision     Perception     Praxis      Pertinent Vitals/Pain Pain Score: 2  Pain Location: R hip Pain Descriptors / Indicators: Aching Pain Intervention(s): Limited activity within patient's tolerance;Monitored during session;Repositioned;Ice applied     Hand Dominance     Extremity/Trunk Assessment Upper Extremity Assessment Upper Extremity Assessment: Overall WFL for tasks assessed           Communication Communication Communication: No difficulties   Cognition Arousal/Alertness: Awake/alert Behavior During Therapy: WFL for tasks assessed/performed Overall Cognitive Status: Within Functional Limits for tasks assessed                     General Comments       Exercises       Shoulder Instructions      Home Living Family/patient expects to be discharged to:: Private residence Living Arrangements: Alone Available Help at Discharge: Family               Bathroom Shower/Tub: Tub/shower unit;Curtain Shower/tub characteristics: Architectural technologist: Standard     Home Equipment: Toilet riser;Walker - 2 wheels;Crutches;Cane - single point   Additional Comments: Sister can assist starting  Saturday       Prior Functioning/Environment Level of Independence: Independent             OT Diagnosis: Generalized weakness   OT Problem List: Decreased strength;Decreased activity tolerance;Decreased knowledge of use of DME or AE;Pain   OT Treatment/Interventions: Self-care/ADL training;DME and/or AE instruction;Patient/family education    OT Goals(Current goals can be found in the care plan section) Acute Rehab OT Goals Patient Stated Goal: walk without pain OT Goal Formulation: With patient Time For Goal Achievement: 05/31/15 Potential to Achieve Goals: Good ADL Goals Pt Will Transfer to Toilet: with modified independence;bedside commode;ambulating Pt Will  Perform Tub/Shower Transfer: Tub transfer;with supervision;ambulating;tub bench  OT Frequency: Min 2X/week   Barriers to D/C:            Co-evaluation              End of Session    Activity Tolerance: Patient tolerated treatment well Patient left: in bed;with call bell/phone within reach   Time: 8677-3736 OT Time Calculation (min): 20 min Charges:  OT General Charges $OT Visit: 1 Procedure OT Evaluation $Initial OT Evaluation Tier I: 1 Procedure G-Codes:    Breon Diss Jun 11, 2015, 2:47 PM  Lesle Chris, OTR/L [phone] 06-11-2015

## 2015-05-25 LAB — CBC
HEMATOCRIT: 31.4 % — AB (ref 36.0–46.0)
Hemoglobin: 10.3 g/dL — ABNORMAL LOW (ref 12.0–15.0)
MCH: 29.3 pg (ref 26.0–34.0)
MCHC: 32.8 g/dL (ref 30.0–36.0)
MCV: 89.5 fL (ref 78.0–100.0)
Platelets: 159 10*3/uL (ref 150–400)
RBC: 3.51 MIL/uL — ABNORMAL LOW (ref 3.87–5.11)
RDW: 13.5 % (ref 11.5–15.5)
WBC: 13.9 10*3/uL — ABNORMAL HIGH (ref 4.0–10.5)

## 2015-05-25 LAB — BASIC METABOLIC PANEL
Anion gap: 6 (ref 5–15)
BUN: 10 mg/dL (ref 6–20)
CALCIUM: 8.5 mg/dL — AB (ref 8.9–10.3)
CO2: 28 mmol/L (ref 22–32)
Chloride: 108 mmol/L (ref 101–111)
Creatinine, Ser: 1.03 mg/dL — ABNORMAL HIGH (ref 0.44–1.00)
GFR calc Af Amer: >60 mL/min (ref >60)
GLUCOSE: 108 mg/dL — AB (ref 65–99)
Potassium: 4.1 mmol/L (ref 3.5–5.1)
SODIUM: 142 mmol/L (ref 135–145)

## 2015-05-25 MED ORDER — OXYCODONE HCL 5 MG PO TABS: 5.0000 mg | ORAL_TABLET | ORAL | Status: DC | PRN

## 2015-05-25 MED ORDER — ONDANSETRON HCL 4 MG PO TABS: 4.0000 mg | ORAL_TABLET | Freq: Four times a day (QID) | ORAL | Status: DC | PRN

## 2015-05-25 MED ORDER — RIVAROXABAN 10 MG PO TABS: 10.0000 mg | ORAL_TABLET | Freq: Every day | ORAL | Status: DC

## 2015-05-25 MED ORDER — METHOCARBAMOL 500 MG PO TABS: 500.0000 mg | ORAL_TABLET | Freq: Four times a day (QID) | ORAL | Status: DC | PRN

## 2015-05-25 NOTE — Progress Notes (Signed)
Physical Therapy Treatment Patient Details Name: Sheryl Hall MRN: [id number] DOB: Jun 05, 1961 Today's Date: 05/25/2015    History of Present Illness R THR; s/p L THR 5/15    PT Comments    POD # 2 am session.  Pt able to get self OOB.  Amb in hallway with walker safely.  Performed all THR TE's following HEP handout.  Instructed on proper tech and freq as well as use of ICE.   Pt ready for D/C to home.   Follow Up Recommendations  Home health PT     Equipment Recommendations  None recommended by PT    Recommendations for Other Services       Precautions / Restrictions Precautions Precautions: Fall Restrictions Weight Bearing Restrictions: No RLE Weight Bearing: Weight bearing as tolerated    Mobility  Bed Mobility Overal bed mobility: Modified Independent       Supine to sit: Modified independent (Device/Increase time)     General bed mobility comments: increased time  Transfers Overall transfer level: Needs assistance Equipment used: Rolling walker (2 wheeled) Transfers: Sit to/from Stand Sit to Stand: Modified independent (Device/Increase time);Supervision         General transfer comment: good safety cognition and use of hands  Ambulation/Gait Ambulation/Gait assistance: Supervision;Modified independent (Device/Increase time) Ambulation Distance (Feet): 275 Feet Assistive device: Rolling walker (2 wheeled) Gait Pattern/deviations: Step-to pattern;Step-through pattern;Trunk flexed Gait velocity: decreased   General Gait Details: increased time and one VC safety with backward gait   Stairs Stairs:  (no stairs to enter home)          Wheelchair Mobility    Modified Rankin (Stroke Patients Only)       Balance                                    Cognition Arousal/Alertness: Awake/alert Behavior During Therapy: WFL for tasks assessed/performed Overall Cognitive Status: Within Functional Limits for tasks assessed                       Exercises   Total Hip Replacement TE's 10 reps ankle pumps 10 reps knee presses 10 reps heel slides 10 reps SAQ's 10 reps ABD Followed by ICE     General Comments        Pertinent Vitals/Pain Pain Assessment: 0-10 Pain Score: 5  Pain Location: R hip Pain Descriptors / Indicators: Aching;Sore Pain Intervention(s): Monitored during session;Repositioned;Ice applied    Home Living                      Prior Function            PT Goals (current goals can now be found in the care plan section) Progress towards PT goals: Progressing toward goals    Frequency  7X/week    PT Plan Current plan remains appropriate    Co-evaluation             End of Session Equipment Utilized During Treatment: Gait belt Activity Tolerance: Patient tolerated treatment well Patient left: in bed;with call bell/phone within reach     Time: 0950-1015 PT Time Calculation (min) (ACUTE ONLY): 25 min  Charges:  $Gait Training: 8-22 mins $Therapeutic Exercise: 8-22 mins                    G Codes:      Rica Koyanagi  PTA  [facility]  Acute  Rehab Pager      [phone]

## 2015-05-25 NOTE — Discharge Instructions (Addendum)
Dr. Gaynelle Arabian Total Joint Specialist [facility] [address] [place], [place] [address] [phone]  ANTERIOR APPROACH TOTAL HIP REPLACEMENT POSTOPERATIVE DIRECTIONS   Hip Rehabilitation, Guidelines Following Surgery  The results of a hip operation are greatly improved after range of motion and muscle strengthening exercises. Follow all safety measures which are given to protect your hip. If any of these exercises cause increased pain or swelling in your joint, decrease the amount until you are comfortable again. Then slowly increase the exercises. Call your caregiver if you have problems or questions.   HOME CARE INSTRUCTIONS  Remove items at home which could result in a fall. This includes throw rugs or furniture in walking pathways.   ICE to the affected hip every three hours for 30 minutes at a time and then as needed for pain and swelling.  Continue to use ice on the hip for pain and swelling from surgery. You may notice swelling that will progress down to the foot and ankle.  This is normal after surgery.  Elevate the leg when you are not up walking on it.    Continue to use the breathing machine which will help keep your temperature down.  It is common for your temperature to cycle up and down following surgery, especially at night when you are not up moving around and exerting yourself.  The breathing machine keeps your lungs expanded and your temperature down.  Do not place pillow under knee, focus on keeping the knee straight while resting  DIET You may resume your previous home diet once your are discharged from the hospital.  DRESSING / WOUND CARE / SHOWERING You may shower 3 days after surgery, but keep the wounds dry during showering.  You may use an occlusive plastic wrap (Press'n Seal for example), NO SOAKING/SUBMERGING IN THE BATHTUB.  If the bandage gets wet, change with a clean dry gauze.  If the incision gets wet, pat the wound dry with  a clean towel. You may start showering once you are discharged home but do not submerge the incision under water. Just pat the incision dry and apply a dry gauze dressing on daily. Change the surgical dressing daily and reapply a dry dressing each time.  ACTIVITY Walk with your walker as instructed. Use walker as long as suggested by your caregivers. Avoid periods of inactivity such as sitting longer than an hour when not asleep. This helps prevent blood clots.  You may resume a sexual relationship in one month or when given the OK by your doctor.  You may return to work once you are cleared by your doctor.  Do not drive a car for 6 weeks or until released by you surgeon.  Do not drive while taking narcotics.  WEIGHT BEARING Weight bearing as tolerated with assist device (walker, cane, etc) as directed, use it as long as suggested by your surgeon or therapist, typically at least 4-6 weeks.  POSTOPERATIVE CONSTIPATION PROTOCOL Constipation - defined medically as fewer than three stools per week and severe constipation as less than one stool per week.  One of the most common issues patients have following surgery is constipation.  Even if you have a regular bowel pattern at home, your normal regimen is likely to be disrupted due to multiple reasons following surgery.  Combination of anesthesia, postoperative narcotics, change in appetite and fluid intake all can affect your bowels.  In order to avoid complications following surgery, here are some recommendations in order to  help you during your recovery period.  Colace (docusate) - Pick up an over-the-counter form of Colace or another stool softener and take twice a day as long as you are requiring postoperative pain medications.  Take with a full glass of water daily.  If you experience loose stools or diarrhea, hold the colace until you stool forms back up.  If your symptoms do not get better within 1 week or if they get worse, check with your  doctor.  Dulcolax (bisacodyl) - Pick up over-the-counter and take as directed by the product packaging as needed to assist with the movement of your bowels.  Take with a full glass of water.  Use this product as needed if not relieved by Colace only.   MiraLax (polyethylene glycol) - Pick up over-the-counter to have on hand.  MiraLax is a solution that will increase the amount of water in your bowels to assist with bowel movements.  Take as directed and can mix with a glass of water, juice, soda, coffee, or tea.  Take if you go more than two days without a movement. Do not use MiraLax more than once per day. Call your doctor if you are still constipated or irregular after using this medication for 7 days in a row.  If you continue to have problems with postoperative constipation, please contact the office for further assistance and recommendations.  If you experience "the worst abdominal pain ever" or develop nausea or vomiting, please contact the office immediatly for further recommendations for treatment.  ITCHING  If you experience itching with your medications, try taking only a single pain pill, or even half a pain pill at a time.  You can also use Benadryl over the counter for itching or also to help with sleep.   TED HOSE STOCKINGS Wear the elastic stockings on both legs for three weeks following surgery during the day but you may remove then at night for sleeping.  MEDICATIONS See your medication summary on the After Visit Summary that the nursing staff will review with you prior to discharge.  You may have some home medications which will be placed on hold until you complete the course of blood thinner medication.  It is important for you to complete the blood thinner medication as prescribed by your surgeon.  Continue your approved medications as instructed at time of discharge.  PRECAUTIONS If you experience chest pain or shortness of breath - call 911 immediately for transfer to the  hospital emergency department.  If you develop a fever greater that 101 F, purulent drainage from wound, increased redness or drainage from wound, foul odor from the wound/dressing, or calf pain - CONTACT YOUR SURGEON.                                                   FOLLOW-UP APPOINTMENTS Make sure you keep all of your appointments after your operation with your surgeon and caregivers. You should call the office at the above phone number and make an appointment for approximately two weeks after the date of your surgery or on the date instructed by your surgeon outlined in the "After Visit Summary".  RANGE OF MOTION AND STRENGTHENING EXERCISES  These exercises are designed to help you keep full movement of your hip joint. Follow your caregiver's or physical therapist's instructions. Perform all exercises about fifteen  times, three times per day or as directed. Exercise both hips, even if you have had only one joint replacement. These exercises can be done on a training (exercise) mat, on the floor, on a table or on a bed. Use whatever works the best and is most comfortable for you. Use music or television while you are exercising so that the exercises are a pleasant break in your day. This will make your life better with the exercises acting as a break in routine you can look forward to.  Lying on your back, slowly slide your foot toward your buttocks, raising your knee up off the floor. Then slowly slide your foot back down until your leg is straight again.  Lying on your back spread your legs as far apart as you can without causing discomfort.  Lying on your side, raise your upper leg and foot straight up from the floor as far as is comfortable. Slowly lower the leg and repeat.  Lying on your back, tighten up the muscle in the front of your thigh (quadriceps muscles). You can do this by keeping your leg straight and trying to raise your heel off the floor. This helps strengthen the largest muscle  supporting your knee.  Lying on your back, tighten up the muscles of your buttocks both with the legs straight and with the knee bent at a comfortable angle while keeping your heel on the floor.   IF YOU ARE TRANSFERRED TO A SKILLED REHAB FACILITY If the patient is transferred to a skilled rehab facility following release from the hospital, a list of the current medications will be sent to the facility for the patient to continue.  When discharged from the skilled rehab facility, please have the facility set up the patient's [place] prior to being released. Also, the skilled facility will be responsible for providing the patient with their medications at time of release from the facility to include their pain medication, the muscle relaxants, and their blood thinner medication. If the patient is still at the rehab facility at time of the two week follow up appointment, the skilled rehab facility will also need to assist the patient in arranging follow up appointment in our office and any transportation needs.  MAKE SURE YOU:  Understand these instructions.  Get help right away if you are not doing well or get worse.    Pick up stool softner and laxative for home use following surgery while on pain medications. Do not submerge incision under water. Please use good hand washing techniques while changing dressing each day. May shower starting three days after surgery. Please use a clean towel to pat the incision dry following showers. Continue to use ice for pain and swelling after surgery. Do not use any lotions or creams on the incision until instructed by your surgeon.  Take Xarelto for two and a half more weeks, then discontinue Xarelto. Once the patient has completed the Xarelto, they may resume the 81 mg Aspirin.   Information on my medicine - XARELTO (Rivaroxaban)  This medication education was reviewed with me or my healthcare representative as part of my discharge  preparation.  The pharmacist that spoke with me during my hospital stay was:  Lolita Patella, [facility]  Why was Xarelto prescribed for you? Xarelto was prescribed for you to reduce the risk of blood clots forming after orthopedic surgery. The medical term for these abnormal blood clots is venous thromboembolism (VTE).  What do you need to  know about xarelto ? Take your Xarelto ONCE DAILY at the same time every day. You may take it either with or without food.  If you have difficulty swallowing the tablet whole, you may crush it and mix in applesauce just prior to taking your dose.  Take Xarelto exactly as prescribed by your doctor and DO NOT stop taking Xarelto without talking to the doctor who prescribed the medication.  Stopping without other VTE prevention medication to take the place of Xarelto may increase your risk of developing a clot.  After discharge, you should have regular check-up appointments with your healthcare provider that is prescribing your Xarelto.    What do you do if you miss a dose? If you miss a dose, take it as soon as you remember on the same day then continue your regularly scheduled once daily regimen the next day. Do not take two doses of Xarelto on the same day.   Important Safety Information A possible side effect of Xarelto is bleeding. You should call your healthcare provider right away if you experience any of the following: ? Bleeding from an injury or your nose that does not stop. ? Unusual colored urine (red or dark brown) or unusual colored stools (red or black). ? Unusual bruising for unknown reasons. ? A serious fall or if you hit your head (even if there is no bleeding).  Some medicines may interact with Xarelto and might increase your risk of bleeding while on Xarelto. To help avoid this, consult your healthcare provider or pharmacist prior to using any new prescription or non-prescription medications, including herbals, vitamins,  non-steroidal anti-inflammatory drugs (NSAIDs) and supplements.  This website has more information on Xarelto: [email or web address]

## 2015-05-25 NOTE — Progress Notes (Signed)
Subjective: 2 Days Post-Op Procedure(s) (LRB): RIGHT TOTAL HIP ARTHROPLASTY ANTERIOR APPROACH (Right) Patient reports pain as mild and moderate.   Patient seen in rounds with Dr. Wynelle Link. Little better but not sleeping. Patient is well, but has had some minor complaints of pain in the hip, requiring pain medications Patient is ready to go home later today.  Objective: Vital signs in last 24 hours: Temp:  [98 F (36.7 C)-98.4 F (36.9 C)] 98 F (36.7 C) (07/01 0624) Pulse Rate:  [63-82] 63 (07/01 0624) Resp:  [16] 16 (07/01 0624) BP: (91-116)/(48-61) 116/61 mmHg (07/01 0624) SpO2:  [93 %-99 %] 94 % (07/01 0624)  Intake/Output from previous day:  Intake/Output Summary (Last 24 hours) at 05/25/15 0756 Last data filed at 05/24/15 1300  Gross per 24 hour  Intake    480 ml  Output    400 ml  Net     80 ml    Labs:  Recent Labs  05/24/15 0528 05/25/15 0537  HGB 11.3* 10.3*    Recent Labs  05/24/15 0528 05/25/15 0537  WBC 11.8* 13.9*  RBC 3.89 3.51*  HCT 34.4* 31.4*  PLT 184 159    Recent Labs  05/24/15 0528 05/25/15 0537  NA 139 142  K 4.2 4.1  CL 107 108  CO2 25 28  BUN 7 10  CREATININE 0.99 1.03*  GLUCOSE 167* 108*  CALCIUM 8.6* 8.5*   No results for input(s): LABPT, INR in the last 72 hours.  EXAM: General - Patient is Alert, Appropriate and Oriented Extremity - Neurovascular intact Sensation intact distally Dorsiflexion/Plantar flexion intact Incision - clean, dry, no drainage, healing Motor Function - intact, moving foot and toes well on exam.   Assessment/Plan: 2 Days Post-Op Procedure(s) (LRB): RIGHT TOTAL HIP ARTHROPLASTY ANTERIOR APPROACH (Right) Procedure(s) (LRB): RIGHT TOTAL HIP ARTHROPLASTY ANTERIOR APPROACH (Right) Past Medical History  Diagnosis Date  . PTSD (post-traumatic stress disorder)   . Bipolar 1 disorder   . Heart murmur   . Diverticulosis of colon   . Keratosis, actinic   . Family hx of colon cancer     age  26-father  . GERD (gastroesophageal reflux disease)   . Chronic nausea   . S/P endoscopy 05/30/2010    Dr Rourk-> non--critical Schatzki's ring, s/p 68F dilation  . S/P colonoscopy 05/30/2010    LAX sphincter tone, anal papilla, left-sided diverticulosis, normal random biopsies,, 1 polyp-TA  . Polysubstance abuse   . Tubular adenoma of colon 05/30/2010    Next colonoscopy 05/2015  . Anxiety   . Depression   . Hyperthyroidism   . Hyperlipidemia   . Folate deficiency   . Hx of abnormal Pap smear   . Low back pain 03/01/2013    MRI with multiple levels of disc bulge  . Chronic left hip pain   . Avascular necrosis of hip     left  . UTI (urinary tract infection)   . Stroke 2014    Right parietal, no deficits   . Arthritis    Principal Problem:   Avascular necrosis of femur head, right Active Problems:   OA (osteoarthritis) of hip  Estimated body mass index is 25.03 kg/(m^2) as calculated from the following:   Height as of this encounter: 5\' 6"  (1.676 m).   Weight as of this encounter: 70.308 kg (155 lb). Up with therapy Discharge home with home health Diet - Regular diet Follow up - in 2 weeks Activity - WBAT Disposition - Home Condition Upon Discharge -  improving D/C Meds - See DC Summary DVT Prophylaxis - Xarelto  Arlee Muslim, PA-C Orthopaedic Surgery 05/25/2015, 7:56 AM

## 2015-05-25 NOTE — Progress Notes (Signed)
Occupational Therapy Treatment Patient Details Name: Sheryl Hall MRN: [id number] DOB: Jul 11, 1961 Today's Date: 05/25/2015    History of present illness R THR; s/p L THR 5/15   OT comments  Pt is making good progress but needs cues for optimal safety.  She verbalizes understanding and will have 24/7 at home.  She does not want  A tub bench for home:  Does not feel it will work in her bathroom  Follow Up Recommendations  Supervision/Assistance - 24 hour    Equipment Recommendations  None recommended by OT    Recommendations for Other Services      Precautions / Restrictions Precautions Precautions: Fall Restrictions RLE Weight Bearing: Weight bearing as tolerated       Mobility Bed Mobility         Supine to sit: Modified independent (Device/Increase time)     General bed mobility comments: HOB raised 20 degrees  Transfers   Equipment used: Rolling walker (2 wheeled) Transfers: Sit to/from Stand Sit to Stand: Supervision         General transfer comment: cues for UE placement    Balance                                   ADL                           Toilet Transfer: Supervision/safety;Ambulation;RW             General ADL Comments: showed pt tub bench and she feels this will not work at her home.  She prefers to sponge bathe. Pt needed min cues for safety ambulating in room to sink where she brushed her teeth and washed up. Pt pushed walker away before backing up to bed and also tended to push up on walker.      Vision                     Perception     Praxis      Cognition   Behavior During Therapy: WFL for tasks assessed/performed Overall Cognitive Status: Within Functional Limits for tasks assessed                       Extremity/Trunk Assessment               Exercises     Shoulder Instructions       General Comments      Pertinent Vitals/ Pain       Pain Score: 5  Pain  Location: R hip Pain Descriptors / Indicators: Aching Pain Intervention(s): Limited activity within patient's tolerance;Monitored during session;Premedicated before session;Repositioned  Home Living                                          Prior Functioning/Environment              Frequency       Progress Toward Goals  OT Goals(current goals can now be found in the care plan section)  Progress towards OT goals: Progressing toward goals     Plan      Co-evaluation                 End of Session  Activity Tolerance Patient tolerated treatment well   Patient Left in bed;with call bell/phone within reach   Nurse Communication          Time: 5035-4656 OT Time Calculation (min): 28 min  Charges: OT General Charges $OT Visit: 1 Procedure OT Treatments $Self Care/Home Management : 23-37 mins  Kaidence Callaway 05/25/2015, 9:10 AM   Lesle Chris, OTR/L [phone] 05/25/2015

## 2015-05-25 NOTE — Discharge Summary (Signed)
Physician Discharge Summary   Patient ID: Sheryl Hall MRN: [id number] DOB/AGE: Aug 06, 1961 54 y.o.  Admit date: 05/23/2015 Discharge date: 05-25-2015  Primary Diagnosis:  Avascular necrosis of the Right hip.   Admission Diagnoses:  Past Medical History  Diagnosis Date  . PTSD (post-traumatic stress disorder)   . Bipolar 1 disorder   . Heart murmur   . Diverticulosis of colon   . Keratosis, actinic   . Family hx of colon cancer     age 81-father  . GERD (gastroesophageal reflux disease)   . Chronic nausea   . S/P endoscopy 05/30/2010    Dr Rourk-> non--critical Schatzki's ring, s/p 62F dilation  . S/P colonoscopy 05/30/2010    LAX sphincter tone, anal papilla, left-sided diverticulosis, normal random biopsies,, 1 polyp-TA  . Polysubstance abuse   . Tubular adenoma of colon 05/30/2010    Next colonoscopy 05/2015  . Anxiety   . Depression   . Hyperthyroidism   . Hyperlipidemia   . Folate deficiency   . Hx of abnormal Pap smear   . Low back pain 03/01/2013    MRI with multiple levels of disc bulge  . Chronic left hip pain   . Avascular necrosis of hip     left  . UTI (urinary tract infection)   . Stroke 2014    Right parietal, no deficits   . Arthritis    Discharge Diagnoses:   Principal Problem:   Avascular necrosis of femur head, right Active Problems:   OA (osteoarthritis) of hip  Estimated body mass index is 25.03 kg/(m^2) as calculated from the following:   Height as of this encounter: _0  (1.676 m).   Weight as of this encounter: 70.308 kg (155 lb).  Procedure(s) (LRB): RIGHT TOTAL HIP ARTHROPLASTY ANTERIOR APPROACH (Right)   Consults: None  HPI: Sheryl Hall is a 54 y.o. female who has advanced end-  stage osteonecrosis of her Right hip with progressively worsening pain and  dysfunction.The patient has failed nonoperative management and presents for  total hip arthroplasty.   Laboratory Data: Admission on 05/23/2015  Component Date Value Ref Range  Status  . WBC 05/24/2015 11.8* 4.0 - 10.5 K/uL Final  . RBC 05/24/2015 3.89  3.87 - 5.11 MIL/uL Final  . Hemoglobin 05/24/2015 11.3* 12.0 - 15.0 g/dL Final  . HCT 05/24/2015 34.4* 36.0 - 46.0 % Final  . MCV 05/24/2015 88.4  78.0 - 100.0 fL Final  . MCH 05/24/2015 29.0  26.0 - 34.0 pg Final  . MCHC 05/24/2015 32.8  30.0 - 36.0 g/dL Final  . RDW 05/24/2015 13.1  11.5 - 15.5 % Final  . Platelets 05/24/2015 184  150 - 400 K/uL Final  . Sodium 05/24/2015 139  135 - 145 mmol/L Final  . Potassium 05/24/2015 4.2  3.5 - 5.1 mmol/L Final  . Chloride 05/24/2015 107  101 - 111 mmol/L Final  . CO2 05/24/2015 25  22 - 32 mmol/L Final  . Glucose, Bld 05/24/2015 167* 65 - 99 mg/dL Final  . BUN 05/24/2015 7  6 - 20 mg/dL Final  . Creatinine, Ser 05/24/2015 0.99  0.44 - 1.00 mg/dL Final  . Calcium 05/24/2015 8.6* 8.9 - 10.3 mg/dL Final  . GFR calc non Af Amer 05/24/2015 >60  >60 mL/min Final  . GFR calc Af Amer 05/24/2015 >60  >60 mL/min Final   Comment: (NOTE) The eGFR has been calculated using the CKD EPI equation. This calculation has not been validated in all clinical situations. eGFR's  persistently <60 mL/min signify possible Chronic Kidney Disease.   . Anion gap 05/24/2015 7  5 - 15 Final  . WBC 05/25/2015 13.9* 4.0 - 10.5 K/uL Final  . RBC 05/25/2015 3.51* 3.87 - 5.11 MIL/uL Final  . Hemoglobin 05/25/2015 10.3* 12.0 - 15.0 g/dL Final  . HCT 05/25/2015 31.4* 36.0 - 46.0 % Final  . MCV 05/25/2015 89.5  78.0 - 100.0 fL Final  . MCH 05/25/2015 29.3  26.0 - 34.0 pg Final  . MCHC 05/25/2015 32.8  30.0 - 36.0 g/dL Final  . RDW 05/25/2015 13.5  11.5 - 15.5 % Final  . Platelets 05/25/2015 159  150 - 400 K/uL Final  . Sodium 05/25/2015 142  135 - 145 mmol/L Final  . Potassium 05/25/2015 4.1  3.5 - 5.1 mmol/L Final  . Chloride 05/25/2015 108  101 - 111 mmol/L Final  . CO2 05/25/2015 28  22 - 32 mmol/L Final  . Glucose, Bld 05/25/2015 108* 65 - 99 mg/dL Final  . BUN 05/25/2015 10  6 - 20 mg/dL  Final  . Creatinine, Ser 05/25/2015 1.03* 0.44 - 1.00 mg/dL Final  . Calcium 05/25/2015 8.5* 8.9 - 10.3 mg/dL Final  . GFR calc non Af Amer 05/25/2015 >60  >60 mL/min Final  . GFR calc Af Amer 05/25/2015 >60  >60 mL/min Final   Comment: (NOTE) The eGFR has been calculated using the CKD EPI equation. This calculation has not been validated in all clinical situations. eGFR's persistently <60 mL/min signify possible Chronic Kidney Disease.   Georgiann Hahn gap 05/25/2015 6  5 - 15 Final  Hospital Outpatient Visit on 05/17/2015  Component Date Value Ref Range Status  . aPTT 05/17/2015 28  24 - 37 seconds Final  . WBC 05/17/2015 6.5  4.0 - 10.5 K/uL Final  . RBC 05/17/2015 4.83  3.87 - 5.11 MIL/uL Final  . Hemoglobin 05/17/2015 13.8  12.0 - 15.0 g/dL Final  . HCT 05/17/2015 42.8  36.0 - 46.0 % Final  . MCV 05/17/2015 88.6  78.0 - 100.0 fL Final  . MCH 05/17/2015 28.6  26.0 - 34.0 pg Final  . MCHC 05/17/2015 32.2  30.0 - 36.0 g/dL Final  . RDW 05/17/2015 13.2  11.5 - 15.5 % Final  . Platelets 05/17/2015 219  150 - 400 K/uL Final  . Sodium 05/17/2015 142  135 - 145 mmol/L Final  . Potassium 05/17/2015 4.2  3.5 - 5.1 mmol/L Final  . Chloride 05/17/2015 106  101 - 111 mmol/L Final  . CO2 05/17/2015 27  22 - 32 mmol/L Final  . Glucose, Bld 05/17/2015 90  65 - 99 mg/dL Final  . BUN 05/17/2015 10  6 - 20 mg/dL Final  . Creatinine, Ser 05/17/2015 0.95  0.44 - 1.00 mg/dL Final  . Calcium 05/17/2015 9.1  8.9 - 10.3 mg/dL Final  . Total Protein 05/17/2015 6.9  6.5 - 8.1 g/dL Final  . Albumin 05/17/2015 4.2  3.5 - 5.0 g/dL Final  . AST 05/17/2015 15  15 - 41 U/L Final  . ALT 05/17/2015 10* 14 - 54 U/L Final  . Alkaline Phosphatase 05/17/2015 108  38 - 126 U/L Final  . Total Bilirubin 05/17/2015 0.4  0.3 - 1.2 mg/dL Final  . GFR calc non Af Amer 05/17/2015 >60  >60 mL/min Final  . GFR calc Af Amer 05/17/2015 >60  >60 mL/min Final   Comment: (NOTE) The eGFR has been calculated using the CKD EPI  equation. This calculation has not been validated  in all clinical situations. eGFR's persistently <60 mL/min signify possible Chronic Kidney Disease.   . Anion gap 05/17/2015 9  5 - 15 Final  . Prothrombin Time 05/17/2015 12.5  11.6 - 15.2 seconds Final  . INR 05/17/2015 0.91  0.00 - 1.49 Final  . ABO/RH(D) 05/17/2015 O POS   Final  . Antibody Screen 05/17/2015 NEG   Final  . Sample Expiration 05/17/2015 05/26/2015   Final  . Color, Urine 05/17/2015 AMBER* YELLOW Final   BIOCHEMICALS MAY BE AFFECTED BY COLOR  . APPearance 05/17/2015 CLEAR  CLEAR Final  . Specific Gravity, Urine 05/17/2015 1.035* 1.005 - 1.030 Final  . pH 05/17/2015 5.5  5.0 - 8.0 Final  . Glucose, UA 05/17/2015 NEGATIVE  NEGATIVE mg/dL Final  . Hgb urine dipstick 05/17/2015 NEGATIVE  NEGATIVE Final  . Bilirubin Urine 05/17/2015 SMALL* NEGATIVE Final  . Ketones, ur 05/17/2015 NEGATIVE  NEGATIVE mg/dL Final  . Protein, ur 05/17/2015 NEGATIVE  NEGATIVE mg/dL Final  . Urobilinogen, UA 05/17/2015 1.0  0.0 - 1.0 mg/dL Final  . Nitrite 05/17/2015 NEGATIVE  NEGATIVE Final  . Leukocytes, UA 05/17/2015 NEGATIVE  NEGATIVE Final   MICROSCOPIC NOT DONE ON URINES WITH NEGATIVE PROTEIN, BLOOD, LEUKOCYTES, NITRITE, OR GLUCOSE <1000 mg/dL.  Marland Kitchen MRSA, PCR 05/17/2015 NEGATIVE  NEGATIVE Final  . Staphylococcus aureus 05/17/2015 NEGATIVE  NEGATIVE Final   Comment:        The Xpert SA Assay (FDA approved for NASAL specimens in patients over 39 years of age), is one component of a comprehensive surveillance program.  Test performance has been validated by [facility] for patients greater than or equal to 50 year old. It is not intended to diagnose infection nor to guide or monitor treatment.   Admission on 04/26/2015, Discharged on 04/26/2015  Component Date Value Ref Range Status  . Sodium 04/26/2015 145  135 - 145 mmol/L Final  . Potassium 04/26/2015 4.4  3.5 - 5.1 mmol/L Final  . Chloride 04/26/2015 109  101 - 111 mmol/L Final    . CO2 04/26/2015 28  22 - 32 mmol/L Final  . Glucose, Bld 04/26/2015 93  65 - 99 mg/dL Final  . BUN 04/26/2015 11  6 - 20 mg/dL Final  . Creatinine, Ser 04/26/2015 1.09* 0.44 - 1.00 mg/dL Final  . Calcium 04/26/2015 9.1  8.9 - 10.3 mg/dL Final  . Total Protein 04/26/2015 6.6  6.5 - 8.1 g/dL Final  . Albumin 04/26/2015 3.8  3.5 - 5.0 g/dL Final  . AST 04/26/2015 14* 15 - 41 U/L Final  . ALT 04/26/2015 8* 14 - 54 U/L Final  . Alkaline Phosphatase 04/26/2015 100  38 - 126 U/L Final  . Total Bilirubin 04/26/2015 0.3  0.3 - 1.2 mg/dL Final  . GFR calc non Af Amer 04/26/2015 57* >60 mL/min Final  . GFR calc Af Amer 04/26/2015 >60  >60 mL/min Final   Comment: (NOTE) The eGFR has been calculated using the CKD EPI equation. This calculation has not been validated in all clinical situations. eGFR's persistently <60 mL/min signify possible Chronic Kidney Disease.   . Anion gap 04/26/2015 8  5 - 15 Final  . WBC 04/26/2015 5.8  4.0 - 10.5 K/uL Final  . RBC 04/26/2015 4.81  3.87 - 5.11 MIL/uL Final  . Hemoglobin 04/26/2015 13.8  12.0 - 15.0 g/dL Final  . HCT 04/26/2015 42.9  36.0 - 46.0 % Final  . MCV 04/26/2015 89.2  78.0 - 100.0 fL Final  . MCH 04/26/2015 28.7  26.0 - 34.0 pg Final  . MCHC 04/26/2015 32.2  30.0 - 36.0 g/dL Final  . RDW 04/26/2015 12.9  11.5 - 15.5 % Final  . Platelets 04/26/2015 179  150 - 400 K/uL Final  . Neutrophils Relative % 04/26/2015 62  43 - 77 % Final  . Neutro Abs 04/26/2015 3.6  1.7 - 7.7 K/uL Final  . Lymphocytes Relative 04/26/2015 29  12 - 46 % Final  . Lymphs Abs 04/26/2015 1.7  0.7 - 4.0 K/uL Final  . Monocytes Relative 04/26/2015 8  3 - 12 % Final  . Monocytes Absolute 04/26/2015 0.5  0.1 - 1.0 K/uL Final  . Eosinophils Relative 04/26/2015 1  0 - 5 % Final  . Eosinophils Absolute 04/26/2015 0.1  0.0 - 0.7 K/uL Final  . Basophils Relative 04/26/2015 0  0 - 1 % Final  . Basophils Absolute 04/26/2015 0.0  0.0 - 0.1 K/uL Final  . Lipase 04/26/2015 24  22 -  51 U/L Final  . Color, Urine 04/26/2015 AMBER* YELLOW Final   BIOCHEMICALS MAY BE AFFECTED BY COLOR  . APPearance 04/26/2015 CLOUDY* CLEAR Final  . Specific Gravity, Urine 04/26/2015 1.030  1.005 - 1.030 Final  . pH 04/26/2015 5.5  5.0 - 8.0 Final  . Glucose, UA 04/26/2015 NEGATIVE  NEGATIVE mg/dL Final  . Hgb urine dipstick 04/26/2015 NEGATIVE  NEGATIVE Final  . Bilirubin Urine 04/26/2015 NEGATIVE  NEGATIVE Final  . Ketones, ur 04/26/2015 NEGATIVE  NEGATIVE mg/dL Final  . Protein, ur 04/26/2015 NEGATIVE  NEGATIVE mg/dL Final  . Urobilinogen, UA 04/26/2015 0.2  0.0 - 1.0 mg/dL Final  . Nitrite 04/26/2015 NEGATIVE  NEGATIVE Final  . Leukocytes, UA 04/26/2015 NEGATIVE  NEGATIVE Final   MICROSCOPIC NOT DONE ON URINES WITH NEGATIVE PROTEIN, BLOOD, LEUKOCYTES, NITRITE, OR GLUCOSE <1000 mg/dL.     X-Rays:Dg Pelvis Portable  05/23/2015   CLINICAL DATA:  54 year old female status post right replacement.  EXAM: PORTABLE PELVIS 1-2 VIEWS  COMPARISON:  Radiograph dated 04/19/2014  FINDINGS: There are bilateral total hip arthroplasties. A drainage catheter is noted with tip in lateral to the femoral component of the right prostheses. There is diffuse soft tissue gas in the right hip, likely postsurgical. No acute fracture or dislocation identified.  IMPRESSION: Bilateral total hip replacements. No acute fracture. No dislocation.  Right hip soft tissue gas and drainage catheter, likely postsurgical.   Electronically Signed   By: Anner Crete M.D.   On: 05/23/2015 16:38   Dg C-arm 1-60 Min-no Report  05/23/2015   CLINICAL DATA: SURGERY   C-ARM 1-60 MINUTES  Fluoroscopy was utilized by the requesting physician.  No radiographic  interpretation.     EKG: Orders placed or performed during the hospital encounter of 05/23/15  . EKG 12-Lead  . EKG 12-Lead  . EKG 12-Lead  . EKG 12-Lead     Hospital Course: Patient was admitted to [facility] and taken to the OR and underwent the above state  procedure without complications.  Patient tolerated the procedure well and was later transferred to the recovery room and then to the orthopaedic floor for postoperative care.  They were given PO and IV analgesics for pain control following their surgery.  They were given 24 hours of postoperative antibiotics of  Anti-infectives    Start     Dose/Rate Route Frequency Ordered Stop   05/24/15 0600  ceFAZolin (ANCEF) IVPB 2 g/50 mL premix     2 g 100 mL/hr over 30 Minutes Intravenous  On call to O.R. 05/23/15 1300 05/23/15 1450   05/23/15 2100  ceFAZolin (ANCEF) IVPB 2 g/50 mL premix     2 g 100 mL/hr over 30 Minutes Intravenous Every 6 hours 05/23/15 1809 05/24/15 0230     and started on DVT prophylaxis in the form of Xarelto.   PT and OT were ordered for total hip protocol.  The patient was allowed to be WBAT with therapy. Discharge planning was consulted to help with postop disposition and equipment needs.  Patient had a decent night on the evening of surgery.  They started to get up OOB with therapy on day one.  Hemovac drain was pulled without difficulty.  Continued to work with therapy into day two.  Dressing was changed on day two and the incision was healing well.  Patient was seen in rounds by Dr. Wynelle Link and was ready to go home later on POD 2.  Discharge home with home health Diet - Regular diet Follow up - in 2 weeks Activity - WBAT Disposition - Home Condition Upon Discharge - improving D/C Meds - See DC Summary DVT Prophylaxis - Xarelto  Discharge Instructions    Call MD / Call 911    Complete by:  As directed   If you experience chest pain or shortness of breath, CALL 911 and be transported to the hospital emergency room.  If you develope a fever above 101 F, pus (white drainage) or increased drainage or redness at the wound, or calf pain, call your surgeon's office.     Change dressing    Complete by:  As directed   You may change your dressing dressing daily with sterile 4 x 4  inch gauze dressing and paper tape.  Do not submerge the incision under water.     Constipation Prevention    Complete by:  As directed   Drink plenty of fluids.  Prune juice may be helpful.  You may use a stool softener, such as Colace (over the counter) 100 mg twice a day.  Use MiraLax (over the counter) for constipation as needed.     Diet general    Complete by:  As directed      Discharge instructions    Complete by:  As directed   Pick up stool softner and laxative for home use following surgery while on pain medications. Do not submerge incision under water. Please use good hand washing techniques while changing dressing each day. May shower starting three days after surgery. Please use a clean towel to pat the incision dry following showers. Continue to use ice for pain and swelling after surgery. Do not use any lotions or creams on the incision until instructed by your surgeon.  Total Hip Protocol.  Take Xarelto for two and a half more weeks, then discontinue Xarelto. Once the patient has completed the Xarelto, they may resume the 81 mg Aspirin.  Postoperative Constipation Protocol  Constipation - defined medically as fewer than three stools per week and severe constipation as less than one stool per week.  One of the most common issues patients have following surgery is constipation.  Even if you have a regular bowel pattern at home, your normal regimen is likely to be disrupted due to multiple reasons following surgery.  Combination of anesthesia, postoperative narcotics, change in appetite and fluid intake all can affect your bowels.  In order to avoid complications following surgery, here are some recommendations in order to help you during your recovery period.  Colace (  docusate) - Pick up an over-the-counter form of Colace or another stool softener and take twice a day as long as you are requiring postoperative pain medications.  Take with a full glass of water daily.  If  you experience loose stools or diarrhea, hold the colace until you stool forms back up.  If your symptoms do not get better within 1 week or if they get worse, check with your doctor.  Dulcolax (bisacodyl) - Pick up over-the-counter and take as directed by the product packaging as needed to assist with the movement of your bowels.  Take with a full glass of water.  Use this product as needed if not relieved by Colace only.   MiraLax (polyethylene glycol) - Pick up over-the-counter to have on hand.  MiraLax is a solution that will increase the amount of water in your bowels to assist with bowel movements.  Take as directed and can mix with a glass of water, juice, soda, coffee, or tea.  Take if you go more than two days without a movement. Do not use MiraLax more than once per day. Call your doctor if you are still constipated or irregular after using this medication for 7 days in a row.  If you continue to have problems with postoperative constipation, please contact the office for further assistance and recommendations.  If you experience "the worst abdominal pain ever" or develop nausea or vomiting, please contact the office immediatly for further recommendations for treatment.     Do not sit on low chairs, stoools or toilet seats, as it may be difficult to get up from low surfaces    Complete by:  As directed      Driving restrictions    Complete by:  As directed   No driving until released by the physician.     Increase activity slowly as tolerated    Complete by:  As directed      Lifting restrictions    Complete by:  As directed   No lifting until released by the physician.     Patient may shower    Complete by:  As directed   You may shower without a dressing once there is no drainage.  Do not wash over the wound.  If drainage remains, do not shower until drainage stops.     TED hose    Complete by:  As directed   Use stockings (TED hose) for 3 weeks on both leg(s).  You may remove them  at night for sleeping.     Weight bearing as tolerated    Complete by:  As directed   Laterality:  right  Extremity:  Lower            Medication List    STOP taking these medications        aspirin 81 MG tablet     folic acid 568 MCG tablet  Commonly known as:  FOLVITE     PROBIOTIC DAILY PO      TAKE these medications        ARIPiprazole 5 MG tablet  Commonly known as:  ABILIFY  Take 1 tablet (5 mg total) by mouth daily.     ARIPiprazole 2 MG tablet  Commonly known as:  ABILIFY  Take 1 tablet (2 mg total) by mouth daily.     clonazePAM 1 MG tablet  Commonly known as:  KLONOPIN  1 tab po BID prn anxiety     DEXILANT 60 MG capsule  Generic  drug:  dexlansoprazole  TAKE ONE CAPSULE BY MOUTH ONCE DAILY.     lamoTRIgine 25 MG tablet  Commonly known as:  LAMICTAL  Take 2 tablets (50 mg total) by mouth every morning. Takes 279m with 266mfor a total of 22524maily     lamoTRIgine 100 MG tablet  Commonly known as:  LAMICTAL  Take 2.5 tablets (250 mg total) by mouth every morning. Takes 200m40mth 25mg62m a total of 225mg 41my     levETIRAcetam 250 MG tablet  Commonly known as:  KEPPRA  TAKE 1 TABLET BY MOUTH IN THE MORNING AND TAKE 2 TABLETS AT BEDTIME.     levothyroxine 50 MCG tablet  Commonly known as:  SYNTHROID, LEVOTHROID  TAKE ONE TABLET BY MOUTH ONCE DAILY.     methocarbamol 500 MG tablet  Commonly known as:  ROBAXIN  Take 1 tablet (500 mg total) by mouth every 6 (six) hours as needed for muscle spasms.     ondansetron 4 MG tablet  Commonly known as:  ZOFRAN  Take 1 tablet (4 mg total) by mouth every 6 (six) hours as needed for nausea.     oxyCODONE 5 MG immediate release tablet  Commonly known as:  Oxy IR/ROXICODONE  Take 1-2 tablets (5-10 mg total) by mouth every 3 (three) hours as needed for moderate pain or severe pain.     rivaroxaban 10 MG Tabs tablet  Commonly known as:  XARELTO  Take 1 tablet (10 mg total) by mouth daily with breakfast.  Take Xarelto for two and a half more weeks, then discontinue Xarelto. Once the patient has completed the Xarelto, they may resume the 81 mg Aspirin.     SYMBICORT 160-4.5 MCG/ACT inhaler  Generic drug:  budesonide-formoterol  USE 2 PUFFS TWICE DAILY AS NEEDED.     traZODone 100 MG tablet  Commonly known as:  DESYREL  Take 1 tablet (100 mg total) by mouth at bedtime.     valACYclovir 1000 MG tablet  Commonly known as:  VALTREX  TAKE 2 TABLETS BY MOUTH TWICE A DAY FOR 1 DAY.           Follow-up Information    [facility]  [facility] physical therapy   Contact information:   [id number] [phone] Follow up with ALUISIGearlean AlfSchedule an appointment as soon as possible for a visit on 06/05/2015.   Specialty:  Orthopedic Surgery   Why:  Call office at [phone] to setup appointment on Tuesday 7/12 with Dr. AluisiWynelle Linkntact information:   [address] [place] [phone] Signed: Drew PArlee [facility] 05/25/2015, 8:04 AM

## 2015-06-21 ENCOUNTER — Other Ambulatory Visit: Payer: Self-pay | Admitting: Physician Assistant

## 2015-06-21 ENCOUNTER — Other Ambulatory Visit: Payer: Self-pay | Admitting: Neurology

## 2015-06-21 NOTE — Telephone Encounter (Signed)
Medication refilled per protocol. 

## 2015-06-25 ENCOUNTER — Ambulatory Visit: Payer: Medicaid Other | Admitting: Family Medicine

## 2015-06-29 ENCOUNTER — Ambulatory Visit: Payer: Medicaid Other | Admitting: Family Medicine

## 2015-07-05 ENCOUNTER — Encounter: Payer: Self-pay | Admitting: Adult Health

## 2015-07-05 ENCOUNTER — Ambulatory Visit (INDEPENDENT_AMBULATORY_CARE_PROVIDER_SITE_OTHER): Payer: Medicaid Other | Admitting: Adult Health

## 2015-07-05 VITALS — BP 108/60 | HR 58 | Ht 66.0 in | Wt 159.0 lb

## 2015-07-05 DIAGNOSIS — G40209 Localization-related (focal) (partial) symptomatic epilepsy and epileptic syndromes with complex partial seizures, not intractable, without status epilepticus: Secondary | ICD-10-CM

## 2015-07-05 NOTE — Patient Instructions (Signed)
Continue Keppra.  If your symptoms worsen or you develop new symptoms please let us know.   

## 2015-07-05 NOTE — Progress Notes (Signed)
I have read the note, and I agree with the clinical assessment and plan.  WILLIS,CHARLES KEITH   

## 2015-07-05 NOTE — Progress Notes (Signed)
PATIENT: Sheryl Hall DOB: 1961-10-14  REASON FOR VISIT: follow up- seizure HISTORY FROM: patient  HISTORY OF PRESENT ILLNESS: Sheryl Hall is a 54 year old female with a history of seizures. She returns today for follow-up. The patient is currently taking Keppra 250 mg in the morning and 500 mg in the evening. She is tolerating this medication well. She denies any episodes of confusion or blackout events. She states that overall she is doing well. She recently had a right hip replacement. She is able to complete all ADLs independently. She  operates a motor vehicle however she has refrained from driving after having the hip replacement. The patient is followed at [facility] psychiatry. She is still complaining of insomnia. She returns today for an evaluation.  HISTORY 04/13/14 (WILLIS): Sheryl Hall is a 53 year old right-handed white female with a history of cerebrovascular disease. 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 EEG study at that time was abnormal, and she has been placed on Keppra. The patient went to the emergency room on 02/25/2014 with an episode of confusion coming out of sleep. The episode lasted over one hour, and resolved after she got to the emergency room. The patient was increased on the [place] taking 1000 mg daily, but she could not tolerate the dose secondary to increased drowsiness. The patient has chronic insomnia at night, and she feels fatigued during the day. The patient has trazodone to take at night with 100 mg tablets. This is not effective for sleep, but if she takes 150 mg at night, she does sleep better. The patient is currently on Keppra taking 250 mg in the morning and 500 mg in the evening. The patient returns to this office for an evaluation.  REVIEW OF SYSTEMS: Out of a complete 14 system review of symptoms, the patient complains only of the following symptoms, and all other reviewed systems  are negative.  Activity change, fatigue, murmur, restless leg, walking difficulty, memory loss  ALLERGIES: No Known Allergies  HOME MEDICATIONS: Outpatient Prescriptions Prior to Visit  Medication Sig Dispense Refill  . ARIPiprazole (ABILIFY) 2 MG tablet Take 1 tablet (2 mg total) by mouth daily. (Patient taking differently: Take 2 mg by mouth at bedtime. ) 30 tablet 5  . ARIPiprazole (ABILIFY) 5 MG tablet Take 1 tablet (5 mg total) by mouth daily. 30 tablet 3  . clonazePAM (KLONOPIN) 1 MG tablet 1 tab po BID prn anxiety (Patient taking differently: Take 1 mg by mouth 2 (two) times daily. ) 60 tablet 0  . DEXILANT 60 MG capsule TAKE ONE CAPSULE BY MOUTH ONCE DAILY. (Patient taking differently: TAKE 60 MG BY MOUTH NIGHTLY AT BEDTIME.) 30 capsule 11  . lamoTRIgine (LAMICTAL) 100 MG tablet Take 2.5 tablets (250 mg total) by mouth every morning. Takes 200mg  with 25mg  for a total of 225mg  daily 60 tablet 0  . lamoTRIgine (LAMICTAL) 25 MG tablet Take 2 tablets (50 mg total) by mouth every morning. Takes 200mg  with 25mg  for a total of 225mg  daily (Patient taking differently: Take 25 mg by mouth every morning. Takes 200mg  with 25mg  for a total of 225mg  daily) 60 tablet 0  . levETIRAcetam (KEPPRA) 250 MG tablet TAKE 1 TABLET BY MOUTH IN THE MORNING AND TAKE 2 TABLETS AT BEDTIME. 30 tablet 0  . levothyroxine (SYNTHROID, LEVOTHROID) 50 MCG tablet TAKE ONE TABLET BY MOUTH ONCE DAILY. 90 tablet 1  . methocarbamol (ROBAXIN) 500 MG tablet  Take 1 tablet (500 mg total) by mouth every 6 (six) hours as needed for muscle spasms. 80 tablet 0  . ondansetron (ZOFRAN) 4 MG tablet Take 1 tablet (4 mg total) by mouth every 6 (six) hours as needed for nausea. 40 tablet 0  . oxyCODONE (OXY IR/ROXICODONE) 5 MG immediate release tablet Take 1-2 tablets (5-10 mg total) by mouth every 3 (three) hours as needed for moderate pain or severe pain. 80 tablet 0  . rivaroxaban (XARELTO) 10 MG TABS tablet Take 1 tablet (10 mg total)  by mouth daily with breakfast. Take Xarelto for two and a half more weeks, then discontinue Xarelto. Once the patient has completed the Xarelto, they may resume the 81 mg Aspirin. 19 tablet 0  . SYMBICORT 160-4.5 MCG/ACT inhaler USE 2 PUFFS TWICE DAILY AS NEEDED. (Patient taking differently: USE 2 PUFFS ONCE DAILY.) 10.2 g 2  . traZODone (DESYREL) 100 MG tablet Take 1 tablet (100 mg total) by mouth at bedtime. 30 tablet 0  . valACYclovir (VALTREX) 1000 MG tablet TAKE 2 TABLETS BY MOUTH TWICE A DAY FOR 1 DAY. 4 tablet 1   No facility-administered medications prior to visit.    PAST MEDICAL HISTORY: Past Medical History  Diagnosis Date  . PTSD (post-traumatic stress disorder)   . Bipolar 1 disorder   . Heart murmur   . Diverticulosis of colon   . Keratosis, actinic   . Family hx of colon cancer     age 98-father  . GERD (gastroesophageal reflux disease)   . Chronic nausea   . S/P endoscopy 05/30/2010    Dr Rourk-> non--critical Schatzki's ring, s/p 48F dilation  . S/P colonoscopy 05/30/2010    LAX sphincter tone, anal papilla, left-sided diverticulosis, normal random biopsies,, 1 polyp-TA  . Polysubstance abuse   . Tubular adenoma of colon 05/30/2010    Next colonoscopy 05/2015  . Anxiety   . Depression   . Hyperthyroidism   . Hyperlipidemia   . Folate deficiency   . Hx of abnormal Pap smear   . Low back pain 03/01/2013    MRI with multiple levels of disc bulge  . Chronic left hip pain   . Avascular necrosis of hip     left  . UTI (urinary tract infection)   . Stroke 2014    Right parietal, no deficits   . Arthritis     PAST SURGICAL HISTORY: Past Surgical History  Procedure Laterality Date  . External ear surgery Left 12 years ago    skin graft from behind ear put in ear canal  . Neck surgery  10 years ago  . Colonoscopy      every 5 years  . Skin cancer destruction    . 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 Joint replacement    . Esophagogastroduodenoscopy  05/30/2010    [id number] ring/small HH otherwise normal  . Colonoscopy  05/30/2010    RMR:lax anal sphincter tone,anal papilla,otherwise normal/left-sided diverticula  . Total hip arthroplasty Right 05/23/2015    Procedure: RIGHT TOTAL HIP ARTHROPLASTY ANTERIOR APPROACH;  Surgeon: Gaynelle Arabian, MD;  Location: [facility];  Service: Orthopedics;  Laterality: Right;    FAMILY HISTORY: Family History  Problem Relation Age of Onset  . Diabetes Mother   . Hypertension Mother   . Colon cancer Father 31  . Hypertension Father   .  Hypertension Sister   . Heart attack Brother     SOCIAL HISTORY: Social History   Social History  . Marital Status: Divorced    Spouse Name: N/A  . Number of Children: 1  . Years of Education: N/A   Occupational History  . disabled    Social History Main Topics  . Smoking status: Former Smoker -- 0.25 packs/day for 30 years    Types: Cigarettes  . Smokeless tobacco: Never Used     Comment: Quit June 2016  . Alcohol Use: No     Comment: social  . Drug Use: No     Comment: hx cocaine abuse  . Sexual Activity: No   Other Topics Concern  . Not on file   Social History Narrative   Patient lives at home alone and she is single.    Disabled.   Education college education.   Right handed.   Caffeine mountain dew four daily.       PHYSICAL EXAM  Filed Vitals:   07/05/15 1604  Height: 5\' 6"  (1.676 m)  Weight: 159 lb (72.122 kg)   Body mass index is 25.68 kg/(m^2).  Generalized: Well developed, in no acute distress   Neurological examination  Mentation: Alert oriented to time, place, history taking. Follows all commands speech and language fluent Cranial nerve II-XII: Pupils were equal round reactive to light. Extraocular movements were full, visual field were full on confrontational test.  Facial sensation and strength were normal. Uvula tongue midline. Head turning and shoulder shrug  were normal and symmetric. Motor: The motor testing reveals 5 over 5 strength of all 4 extremities. Good symmetric motor tone is noted throughout.  Sensory: Sensory testing is intact to soft touch on all 4 extremities. No evidence of extinction is noted.  Coordination: Cerebellar testing reveals good finger-nose-finger and heel-to-shin bilaterally.  Gait and station: Gait is normal. Tandem gait is normal. Romberg is negative. No drift is seen.  Reflexes: Deep tendon reflexes are symmetric and normal bilaterally.   DIAGNOSTIC DATA (LABS, IMAGING, TESTING) - I reviewed patient records, labs, notes, testing and imaging myself where available.  ASSESSMENT AND PLAN 54 y.o. year old female  has a past medical history of PTSD (post-traumatic stress disorder); Bipolar 1 disorder; Heart murmur; Diverticulosis of colon; Keratosis, actinic; Family hx of colon cancer; GERD (gastroesophageal reflux disease); Chronic nausea; S/P endoscopy (05/30/2010); S/P colonoscopy (05/30/2010); Polysubstance abuse; Tubular adenoma of colon (05/30/2010); Anxiety; Depression; Hyperthyroidism; Hyperlipidemia; Folate deficiency; abnormal Pap smear; Low back pain (03/01/2013); Chronic left hip pain; Avascular necrosis of hip; UTI (urinary tract infection); Stroke (2014); and Arthritis. here with :  1. Seizures  Overall the patient is doing well. She will continue on Keppra 250 mg in the morning and 500 mg in the evening. Patient advised to let us know if she experiences any more possible seizure events. Patient advised to follow-up with [facility] psychiatry in regards to insomnia. Her medications may need to be adjusted. Patient verbalized understanding. She will follow-up in 6 months or sooner if needed.    Ward Givens, MSN, NP-C 07/05/2015, 4:20 PM [facility] [address] [place], [place] [address] [phone]  Note: This document was prepared with digital dictation and possible smart phrase technology. Any transcriptional errors that result from this process are unintentional.

## 2015-07-06 ENCOUNTER — Other Ambulatory Visit: Payer: Self-pay | Admitting: Physician Assistant

## 2015-07-10 ENCOUNTER — Telehealth: Payer: Self-pay | Admitting: Adult Health

## 2015-07-10 ENCOUNTER — Other Ambulatory Visit: Payer: Self-pay | Admitting: Neurology

## 2015-07-10 NOTE — Telephone Encounter (Signed)
Patient is calling is and states that Rx levetiracetam(Keppra) 250 mg was not received by her pharmacy [facility], [place].  She has 1 pill left. Thanks!

## 2015-07-10 NOTE — Telephone Encounter (Signed)
Rx has been sent  

## 2015-12-28 ENCOUNTER — Other Ambulatory Visit: Payer: Self-pay | Admitting: Adult Health

## 2015-12-31 ENCOUNTER — Other Ambulatory Visit: Payer: Self-pay

## 2015-12-31 MED ORDER — LEVETIRACETAM 250 MG PO TABS: ORAL_TABLET | ORAL | Status: DC

## 2016-01-09 ENCOUNTER — Ambulatory Visit: Payer: Self-pay | Admitting: Adult Health

## 2016-01-10 ENCOUNTER — Encounter: Payer: Self-pay | Admitting: Adult Health

## 2016-01-24 ENCOUNTER — Other Ambulatory Visit: Payer: Self-pay | Admitting: Family Medicine

## 2016-01-24 NOTE — Telephone Encounter (Signed)
Refill appropriate and filled per protocol. 

## 2016-02-19 ENCOUNTER — Encounter: Payer: Self-pay | Admitting: Family Medicine

## 2016-02-19 ENCOUNTER — Ambulatory Visit (INDEPENDENT_AMBULATORY_CARE_PROVIDER_SITE_OTHER): Payer: Medicaid Other | Admitting: Family Medicine

## 2016-02-19 VITALS — BP 106/64 | HR 68 | Temp 98.2°F | Resp 14 | Wt 161.0 lb

## 2016-02-19 DIAGNOSIS — R140 Abdominal distension (gaseous): Secondary | ICD-10-CM

## 2016-02-19 DIAGNOSIS — R112 Nausea with vomiting, unspecified: Secondary | ICD-10-CM

## 2016-02-19 DIAGNOSIS — Z1159 Encounter for screening for other viral diseases: Secondary | ICD-10-CM

## 2016-02-19 DIAGNOSIS — K3184 Gastroparesis: Secondary | ICD-10-CM | POA: Diagnosis not present

## 2016-02-19 NOTE — Progress Notes (Signed)
Subjective:    Patient ID: Sheryl Hall, female    DOB: 30-Jan-1961, 55 y.o.   MRN: [id number]  HPI  Patient has a history of irritable bowel syndrome, gastroparesis, and hypothyroidism, which is also complicated by bipolar syndrome. She is overdue to check a TSH. She does report severe fatigue and lack of energy. She denies any hypersomnolence. She does not fall asleep easily. She denies any apnea. Her psychiatrist is trying to change her medications to help some of her fatigue but she is concerned it may be due to her thyroid issues. She has not had a TSH checked in some time and she is questioning whether she is on the right dosage of the medication. She is seen gastroenterology numerous times in the past. She has been tried on Phenergan, Zofran, Reglan without improvement in her nausea. She's been tried on Linzess for chronic constipation. At the present time she reports her daily chronic nausea. She denies any vomiting. She denies any constipation. She denies any diarrhea. She denies any blood in her stool. She denies any black tarry stool Past Medical History  Diagnosis Date  . PTSD (post-traumatic stress disorder)   . Bipolar 1 disorder ([place])   . Heart murmur   . Diverticulosis of colon   . Keratosis, actinic   . Family hx of colon cancer     age 39-father  . GERD (gastroesophageal reflux disease)   . Chronic nausea   . S/P endoscopy 05/30/2010    Dr Rourk-> non--critical Schatzki's ring, s/p 83F dilation  . S/P colonoscopy 05/30/2010    LAX sphincter tone, anal papilla, left-sided diverticulosis, normal random biopsies,, 1 polyp-TA  . Polysubstance abuse   . Tubular adenoma of colon 05/30/2010    Next colonoscopy 05/2015  . Anxiety   . Depression   . Hyperthyroidism   . Hyperlipidemia   . Folate deficiency   . Hx of abnormal Pap smear   . Low back pain 03/01/2013    MRI with multiple levels of disc bulge  . Chronic left hip pain   . Avascular necrosis of hip ([place])     left  .  UTI (urinary tract infection)   . Stroke [facility]) 2014    Right parietal, no deficits   . Arthritis    Past Surgical History  Procedure Laterality Date  . External ear surgery Left 12 years ago    skin graft from behind ear put in ear canal  . Neck surgery  10 years ago  . Colonoscopy      every 5 years  . Skin cancer destruction    . 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 Joint replacement    . Esophagogastroduodenoscopy  05/30/2010    [id number] ring/small HH otherwise normal  . Colonoscopy  05/30/2010    RMR:lax anal sphincter tone,anal papilla,otherwise normal/left-sided diverticula  . Total hip arthroplasty Right 05/23/2015    Procedure: RIGHT TOTAL HIP ARTHROPLASTY ANTERIOR APPROACH;  Surgeon: Gaynelle Arabian, MD;  Location: [facility];  Service: Orthopedics;  Laterality: Right;   Current Outpatient Prescriptions on File Prior to Visit  Medication Sig Dispense Refill  . ARIPiprazole (ABILIFY) 2 MG tablet Take 1 tablet (2 mg total) by mouth daily. (Patient taking differently: Take 2 mg by mouth at bedtime. ) 30 tablet 5  . ARIPiprazole (ABILIFY) 5 MG tablet Take  1 tablet (5 mg total) by mouth daily. 30 tablet 3  . aspirin EC 81 MG tablet Take 81 mg by mouth daily.    . clonazePAM (KLONOPIN) 1 MG tablet 1 tab po BID prn anxiety (Patient taking differently: Take 0.5 mg by mouth 3 (three) times daily. ) 60 tablet 0  . DEXILANT 60 MG capsule TAKE ONE CAPSULE BY MOUTH ONCE DAILY. 30 capsule 11  . lamoTRIgine (LAMICTAL) 100 MG tablet Take 2.5 tablets (250 mg total) by mouth every morning. Takes 200mg  with 25mg  for a total of 225mg  daily 60 tablet 0  . lamoTRIgine (LAMICTAL) 25 MG tablet Take 2 tablets (50 mg total) by mouth every morning. Takes 200mg  with 25mg  for a total of 225mg  daily (Patient taking differently: Take 25 mg by mouth  every morning. Takes 200mg  with 25mg  for a total of 225mg  daily) 60 tablet 0  . levETIRAcetam (KEPPRA) 250 MG tablet TAKE 1 TABLET BY MOUTH IN THE MORNING AND TAKE 2 TABLETS AT BEDTIME. 90 tablet 6  . levothyroxine (SYNTHROID, LEVOTHROID) 50 MCG tablet TAKE ONE TABLET BY MOUTH ONCE DAILY. 30 tablet 0  . methocarbamol (ROBAXIN) 500 MG tablet Take 1 tablet (500 mg total) by mouth every 6 (six) hours as needed for muscle spasms. 80 tablet 0  . traZODone (DESYREL) 100 MG tablet Take 1 tablet (100 mg total) by mouth at bedtime. (Patient taking differently: Take 50 mg by mouth at bedtime. ) 30 tablet 0   No current facility-administered medications on file prior to visit.   No Known Allergies Social History   Social History  . Marital Status: Divorced    Spouse Name: N/A  . Number of Children: 1  . Years of Education: N/A   Occupational History  . disabled    Social History Main Topics  . Smoking status: Former Smoker -- 0.25 packs/day for 30 years    Types: Cigarettes  . Smokeless tobacco: Never Used     Comment: Quit June 2016  . Alcohol Use: No     Comment: social  . Drug Use: No     Comment: hx cocaine abuse  . Sexual Activity: No   Other Topics Concern  . Not on file   Social History Narrative   Patient lives at home alone and she is single.    Disabled.   Education college education.   Right handed.   Caffeine mountain dew four daily.      Review of Systems  All other systems reviewed and are negative.      Objective:   Physical Exam  Cardiovascular: Normal rate, regular rhythm and normal heart sounds.   No murmur heard. Pulmonary/Chest: Effort normal and breath sounds normal. No respiratory distress. She has no wheezes. She has no rales.  Abdominal: Soft. Bowel sounds are normal. She exhibits no distension. There is no tenderness. There is no rebound.  Vitals reviewed.         Assessment & Plan:  Abdominal bloating  Nausea and vomiting, vomiting of  unspecified type - Plan: CBC with Differential/Platelet, COMPLETE METABOLIC PANEL WITH GFR, TSH  Gastroparesis  Need for hepatitis C screening test - Plan: Hepatitis C Ab Reflex HCV RNA, QUANT  Begin by checking a TSH. Was make sure that her thyroid dosage is appropriate. She is also requesting hepatitis C screening will she is here and she certainly falls within the affected age range. Once I have her lab work back, if there is no explanation for her nausea  vomiting and fatigue, I will consider other treatments for gastroparesis such as erythromycin or Compazine. However I'm concerned the Compazine may exacerbate her fatigue.

## 2016-02-20 LAB — CBC WITH DIFFERENTIAL/PLATELET
BASOS PCT: 1 % (ref 0–1)
Basophils Absolute: 0.1 10*3/uL (ref 0.0–0.1)
Eosinophils Absolute: 0.1 10*3/uL (ref 0.0–0.7)
Eosinophils Relative: 2 % (ref 0–5)
HCT: 40.8 % (ref 36.0–46.0)
HEMOGLOBIN: 13.4 g/dL (ref 12.0–15.0)
LYMPHS ABS: 1.6 10*3/uL (ref 0.7–4.0)
Lymphocytes Relative: 31 % (ref 12–46)
MCH: 28.5 pg (ref 26.0–34.0)
MCHC: 32.8 g/dL (ref 30.0–36.0)
MCV: 86.6 fL (ref 78.0–100.0)
MONOS PCT: 9 % (ref 3–12)
MPV: 10.2 fL (ref 8.6–12.4)
Monocytes Absolute: 0.5 10*3/uL (ref 0.1–1.0)
NEUTROS ABS: 2.9 10*3/uL (ref 1.7–7.7)
Neutrophils Relative %: 57 % (ref 43–77)
Platelets: 260 10*3/uL (ref 150–400)
RBC: 4.71 MIL/uL (ref 3.87–5.11)
RDW: 13.6 % (ref 11.5–15.5)
WBC: 5.1 10*3/uL (ref 4.0–10.5)

## 2016-02-20 LAB — COMPLETE METABOLIC PANEL WITH GFR
ALT: 12 U/L (ref 6–29)
AST: 25 U/L (ref 10–35)
Albumin: 4.3 g/dL (ref 3.6–5.1)
Alkaline Phosphatase: 118 U/L (ref 33–130)
BILIRUBIN TOTAL: 0.4 mg/dL (ref 0.2–1.2)
BUN: 9 mg/dL (ref 7–25)
CO2: 23 mmol/L (ref 20–31)
CREATININE: 1.12 mg/dL — AB (ref 0.50–1.05)
Calcium: 8.9 mg/dL (ref 8.6–10.4)
Chloride: 105 mmol/L (ref 98–110)
GFR, Est African American: 65 mL/min (ref >=60)
GFR, Est Non African American: 56 mL/min — ABNORMAL LOW (ref >=60)
GLUCOSE: 65 mg/dL — AB (ref 70–99)
Potassium: 4.7 mmol/L (ref 3.5–5.3)
SODIUM: 142 mmol/L (ref 135–146)
Total Protein: 6.5 g/dL (ref 6.1–8.1)

## 2016-02-20 LAB — HEPATITIS C ANTIBODY: HCV Ab: NEGATIVE

## 2016-02-20 LAB — TSH: TSH: 1.58 m[IU]/L

## 2016-02-21 ENCOUNTER — Other Ambulatory Visit: Payer: Self-pay | Admitting: Family Medicine

## 2016-02-21 MED ORDER — METOCLOPRAMIDE HCL 10 MG PO TABS: 10.0000 mg | ORAL_TABLET | Freq: Three times a day (TID) | ORAL | Status: DC

## 2016-02-22 ENCOUNTER — Other Ambulatory Visit: Payer: Self-pay | Admitting: Family Medicine

## 2016-02-22 NOTE — Telephone Encounter (Signed)
Refill appropriate and filled per protocol. 

## 2016-02-27 ENCOUNTER — Telehealth: Payer: Self-pay | Admitting: Family Medicine

## 2016-02-27 NOTE — Telephone Encounter (Signed)
Patient is calling regarding medications that dr pickard prescribed for her last week,  Please call her at [phone]

## 2016-02-28 NOTE — Telephone Encounter (Signed)
Compazine 10 mg po tid prn nausea.

## 2016-02-28 NOTE — Telephone Encounter (Signed)
Pt states that she is feeling very bloated and just not feeling well in general. She states that you were going to give her something to help with that?? Also she was unable to take the Reglan d/t it interfering with her mental health medications - irritable, etc

## 2016-02-29 MED ORDER — PROCHLORPERAZINE MALEATE 10 MG PO TABS: 10.0000 mg | ORAL_TABLET | Freq: Three times a day (TID) | ORAL | Status: DC

## 2016-02-29 NOTE — Telephone Encounter (Signed)
Medication called/sent to requested pharmacy and pt aware via vm 

## 2016-03-18 ENCOUNTER — Other Ambulatory Visit: Payer: Self-pay | Admitting: Family Medicine

## 2016-03-19 IMAGING — CT CT HEAD W/O CM
1 series · 15 of 30 positions shown, 19 images · non-contrast
Comparison: July 19, 2013

CLINICAL DATA: Altered mental status

EXAM:
CT HEAD WITHOUT CONTRAST
TECHNIQUE: Contiguous axial images were obtained from the base of the skull
through the vertex without intravenous contrast. Study was obtained
within 24 hr of patient's arrival at the emergency department.

[Series 2: headtrauma 4.8 h37s · axial · 0.46mm/px · z∈[+62,+195]mm · 15 of 30 slices shown, 19 images]
[im 2/30  brain]
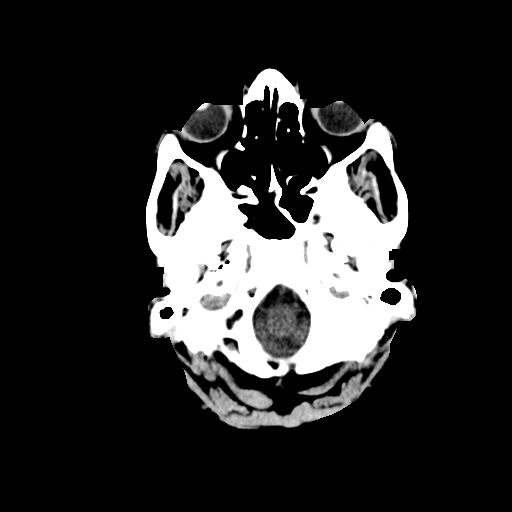
[im 2/30  bone]
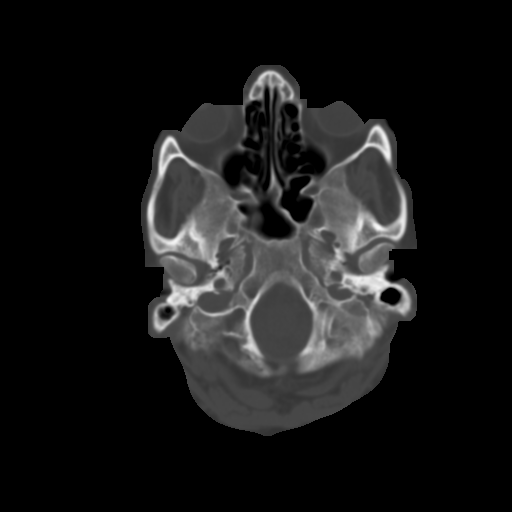
[im 4/30  brain]
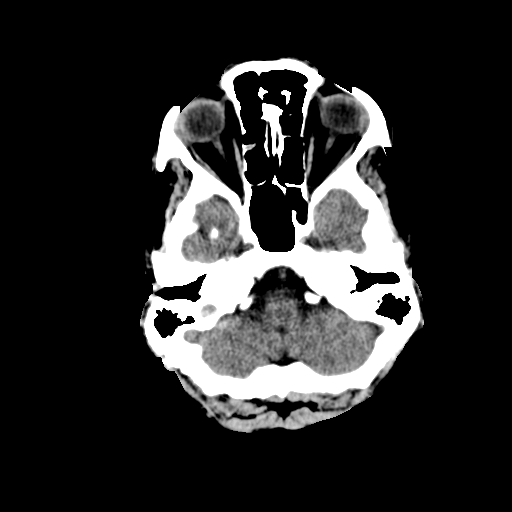
[im 6/30  brain]
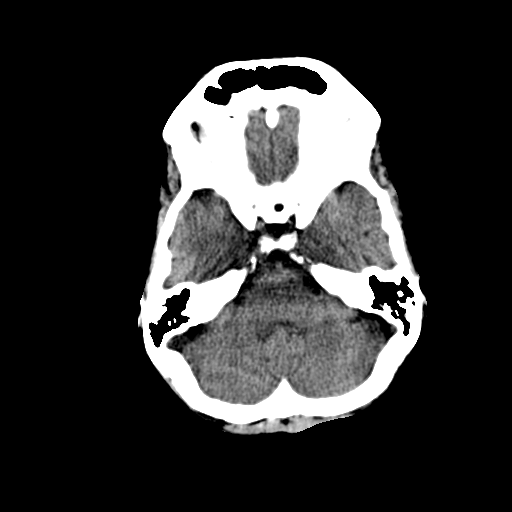
[im 8/30  brain]
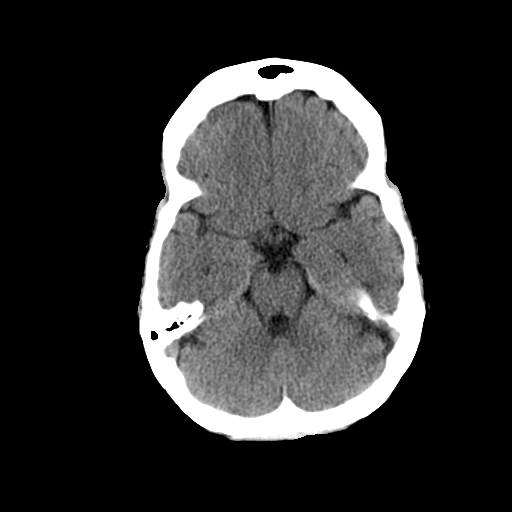
[im 10/30  brain]
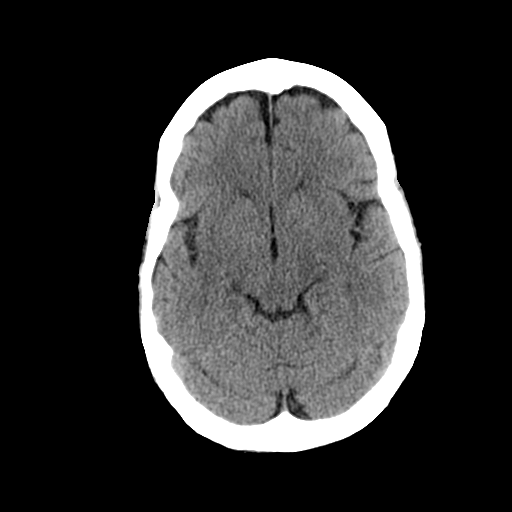
[im 10/30  bone]
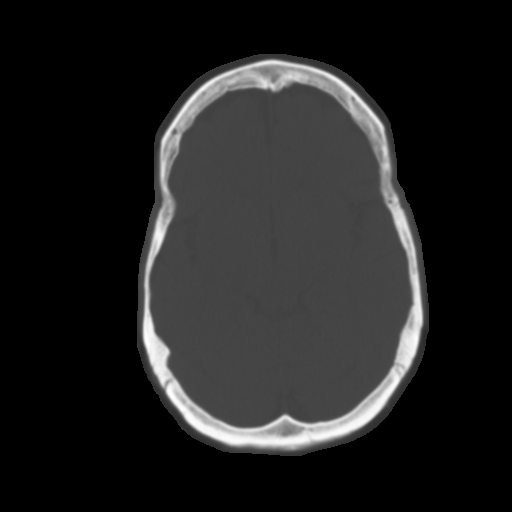
[im 12/30  brain]
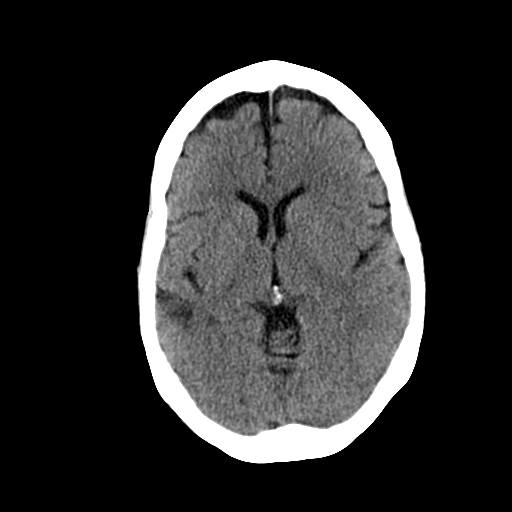
[im 14/30  brain]
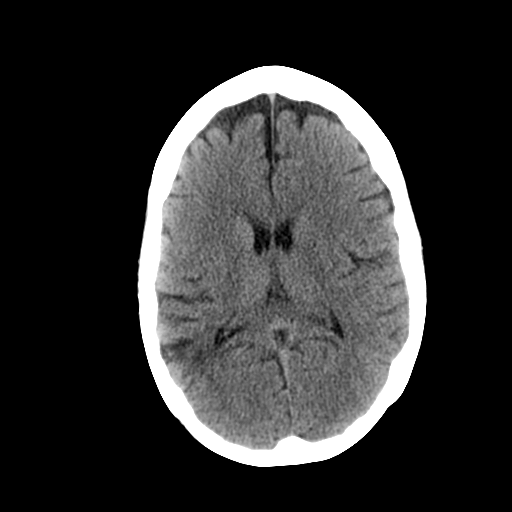
[im 16/30  brain]
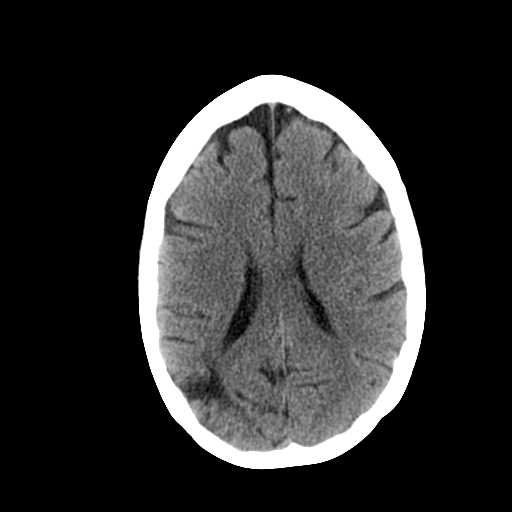
[im 17/30  brain]
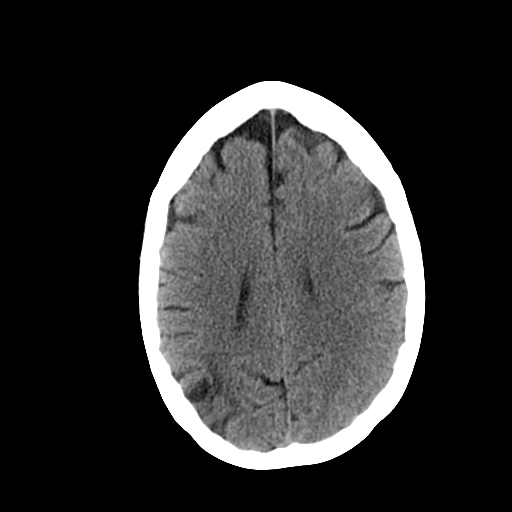
[im 17/30  bone]
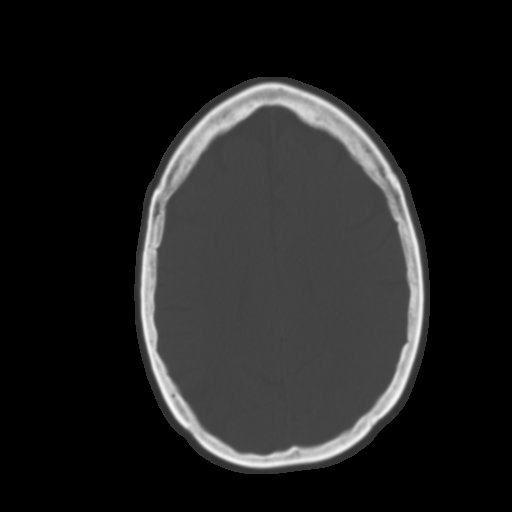
[im 19/30  brain]
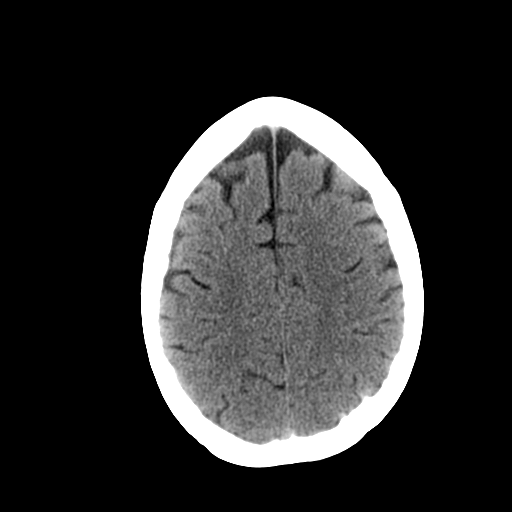
[im 21/30  brain]
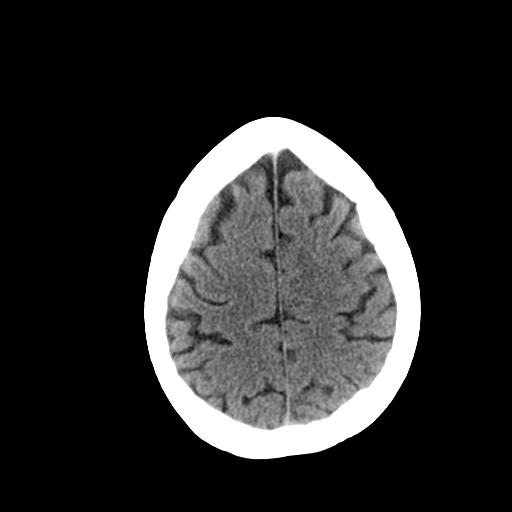
[im 23/30  brain]
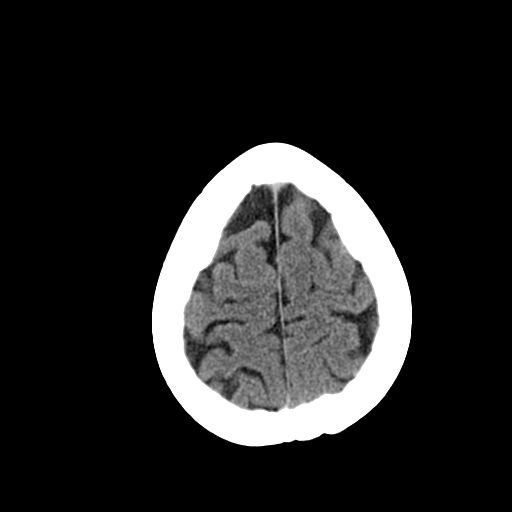
[im 25/30  brain]
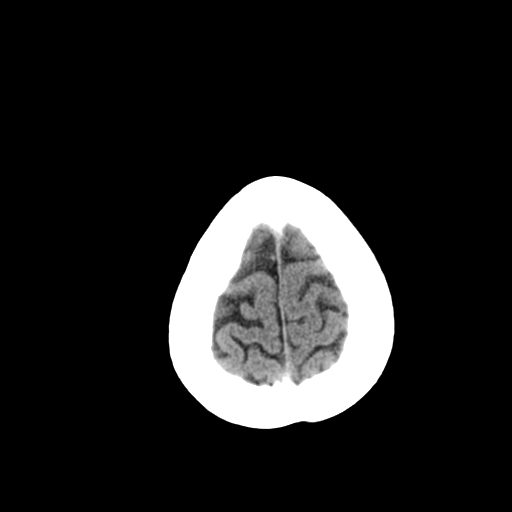
[im 25/30  bone]
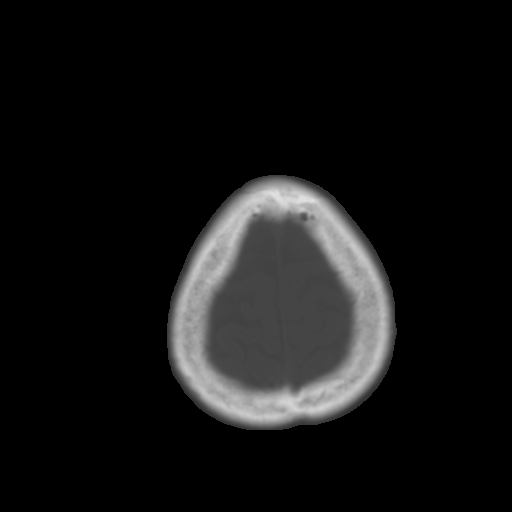
[im 27/30  brain]
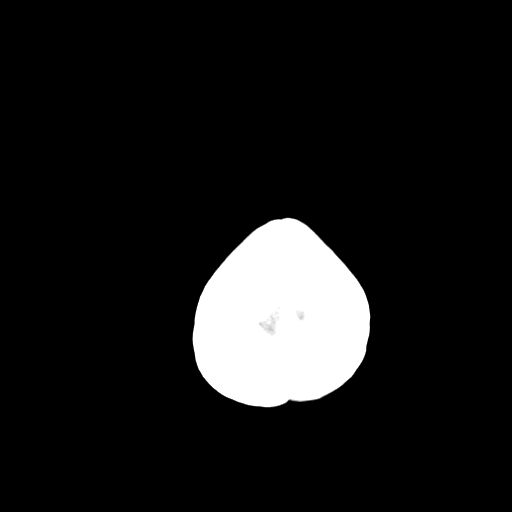
[im 29/30  brain]
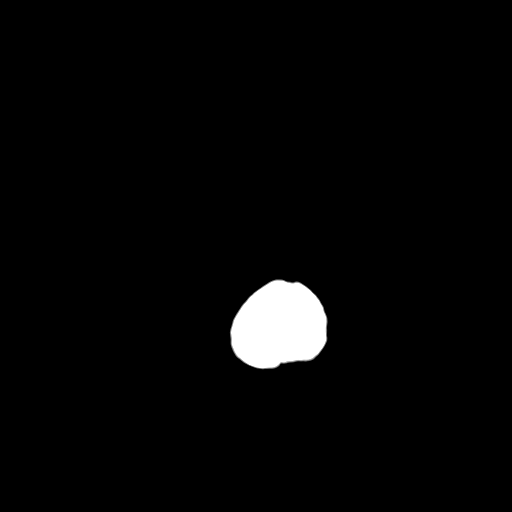

[15 of 30 positions shown; findings below may reference images not displayed]

FINDINGS: The ventricles are normal in size and configuration. However, there
is mild frontal atrophy bilaterally which is stable. There is no
mass, hemorrhage, extra-axial fluid collection, or midline shift.
There is evidence of a prior infarct at the junction of the right
posterior temporal -anterior occipital -inferior parietal lobes with
extension into the mid right parietal lobe. This finding is stable.
There is no new gray-white compartment lesion. No acute infarct
apparent. Bony calvarium appears intact. The mastoid air cells are
clear.
IMPRESSION: Prior infarct on the right at the temporal -occipital -parietal
junction with extension into the mid right parietal lobe, stable.
The gray-white compartments are otherwise normal. No intracranial
mass, hemorrhage, or acute appearing infarct.

## 2016-03-24 ENCOUNTER — Other Ambulatory Visit: Payer: Self-pay | Admitting: Family Medicine

## 2016-03-24 NOTE — Telephone Encounter (Signed)
Refill appropriate and filled per protocol. 

## 2016-03-26 ENCOUNTER — Telehealth: Payer: Self-pay | Admitting: Family Medicine

## 2016-03-26 NOTE — Telephone Encounter (Signed)
Insurance is not Dance [employer] psychotherapist but will cover name brand nexium - ok to change?

## 2016-03-27 MED ORDER — ESOMEPRAZOLE MAGNESIUM 40 MG PO CPDR: 40.0000 mg | DELAYED_RELEASE_CAPSULE | Freq: Every day | ORAL | Status: DC

## 2016-03-27 NOTE — Telephone Encounter (Signed)
ok 

## 2016-03-27 NOTE — Telephone Encounter (Signed)
Med changed and sent to pharm 

## 2016-04-01 ENCOUNTER — Ambulatory Visit: Payer: Self-pay | Admitting: Family Medicine

## 2016-04-08 ENCOUNTER — Ambulatory Visit (INDEPENDENT_AMBULATORY_CARE_PROVIDER_SITE_OTHER): Payer: Medicaid Other | Admitting: Family Medicine

## 2016-04-08 ENCOUNTER — Encounter: Payer: Self-pay | Admitting: Family Medicine

## 2016-04-08 VITALS — BP 136/80 | HR 88 | Temp 98.7°F | Resp 14 | Ht 66.0 in | Wt 167.0 lb

## 2016-04-08 DIAGNOSIS — B001 Herpesviral vesicular dermatitis: Secondary | ICD-10-CM | POA: Diagnosis not present

## 2016-04-08 DIAGNOSIS — R112 Nausea with vomiting, unspecified: Secondary | ICD-10-CM | POA: Diagnosis not present

## 2016-04-08 DIAGNOSIS — R1084 Generalized abdominal pain: Secondary | ICD-10-CM

## 2016-04-08 MED ORDER — VALACYCLOVIR HCL 500 MG PO TABS: 500.0000 mg | ORAL_TABLET | Freq: Every day | ORAL | Status: DC

## 2016-04-08 NOTE — Progress Notes (Signed)
Patient ID: Sheryl Hall, female   DOB: 12-15-1960, 55 y.o.   MRN: [id number]   Subjective:    Patient ID: Sheryl Hall, female    DOB: Apr 10, 1961, 55 y.o.   MRN: [id number]  Patient presents for Labs and Lip Irritation Years secondary to recurrent cold sores. She will like to be put back on suppressive therapy. She has severe outbreak last week she's been getting them almost monthly. Valtrex does resolve for outbreaks.  She asked about her lithium levels being checked she was recently started on lithium by her psychiatrist about 3 weeks ago she has a follow-up appointment in a few weeks recommend that she follow with her psychiatrist with regards to checking her lithium levels and labs.  She was seen back in March with some nausea vomiting abdominal bloating which she has recurrent episodes of. She's had endoscopy and colonoscopy in the past she has history of irritable bowel as well as acid reflux. There is some questionable if she has gallbladder etiology or some gastroparesis. She did use the Compazine which helped some of her nausea she is not sure if any particular foods that set off her spells. She would like further evaluation. She has seen Dr. Gala Romney in the past    Review Of Systems:  GEN- denies fatigue, fever, weight loss,weakness, recent illness HEENT- denies eye drainage, change in vision, nasal discharge, CVS- denies chest pain, palpitations RESP- denies SOB, cough, wheeze ABD- + N/V, change in stools, +abd pain GU- denies dysuria, hematuria, dribbling, incontinence MSK- denies joint pain, muscle aches, injury Neuro- denies headache, dizziness, syncope, seizure activity       Objective:    BP 136/80 mmHg  Pulse 88  Temp(Src) 98.7 F (37.1 C) (Oral)  Resp 14  Ht 5\' 6"  (1.676 m)  Wt 167 lb (75.751 kg)  BMI 26.97 kg/m2 GEN- NAD, alert and oriented x3 HEENT- PERRL, EOMI, non injected sclera, pink conjunctiva, MMM, oropharynx clear CVS- RRR, no  murmur RESP-CTAB ABD-NABS,soft,NT,ND Skin- clear, no blistering lesions        Assessment & Plan:      Problem List Items Addressed This Visit    None      Note: This dictation was prepared with Dragon dictation along with smaller phrase technology. Any transcriptional errors that result from this process are unintentional.

## 2016-04-08 NOTE — Patient Instructions (Signed)
Take the nausea medication as needed Start valtrex Ultrasound to be done on stomach  F/U as needed

## 2016-04-09 ENCOUNTER — Encounter: Payer: Self-pay | Admitting: Family Medicine

## 2016-04-09 DIAGNOSIS — B001 Herpesviral vesicular dermatitis: Secondary | ICD-10-CM | POA: Insufficient documentation

## 2016-04-09 NOTE — Assessment & Plan Note (Signed)
Recurrent episodes of nausea vomiting this is questionable history of gastroparesis also probable new gallbladder in etiology with the bloating and other symptoms. I will proceed with a abdominal ultrasound. If there is nothing found on this and refer her back to gastroenterology gastric ulcer also possibility she will continue with her antiemetics for now

## 2016-04-09 NOTE — Assessment & Plan Note (Signed)
Restart suppression therapy with Valtrex 500 mg daily

## 2016-04-11 ENCOUNTER — Other Ambulatory Visit: Payer: Self-pay | Admitting: Family Medicine

## 2016-04-11 DIAGNOSIS — E0590 Thyrotoxicosis, unspecified without thyrotoxic crisis or storm: Secondary | ICD-10-CM

## 2016-04-11 DIAGNOSIS — E041 Nontoxic single thyroid nodule: Secondary | ICD-10-CM

## 2016-04-16 ENCOUNTER — Ambulatory Visit (HOSPITAL_COMMUNITY)
Admission: RE | Admit: 2016-04-16 | Discharge: 2016-04-16 | Disposition: A | Discharge status: Home / Self Care | Payer: Medicaid Other | Source: Ambulatory Visit | Attending: Family Medicine | Admitting: Family Medicine

## 2016-04-16 DIAGNOSIS — R1084 Generalized abdominal pain: Secondary | ICD-10-CM

## 2016-04-16 DIAGNOSIS — R112 Nausea with vomiting, unspecified: Secondary | ICD-10-CM | POA: Diagnosis present

## 2016-04-17 ENCOUNTER — Other Ambulatory Visit: Payer: Self-pay | Admitting: *Deleted

## 2016-04-17 DIAGNOSIS — R1084 Generalized abdominal pain: Secondary | ICD-10-CM

## 2016-04-27 ENCOUNTER — Emergency Department (HOSPITAL_COMMUNITY)
Admission: EM | Admit: 2016-04-27 | Discharge: 2016-04-27 | Disposition: A | Discharge status: Home / Self Care | Payer: Medicaid Other | Attending: Emergency Medicine | Admitting: Emergency Medicine

## 2016-04-27 ENCOUNTER — Encounter (HOSPITAL_COMMUNITY): Payer: Self-pay | Admitting: Emergency Medicine

## 2016-04-27 DIAGNOSIS — H6692 Otitis media, unspecified, left ear: Secondary | ICD-10-CM | POA: Insufficient documentation

## 2016-04-27 DIAGNOSIS — Z8673 Personal history of transient ischemic attack (TIA), and cerebral infarction without residual deficits: Secondary | ICD-10-CM | POA: Insufficient documentation

## 2016-04-27 DIAGNOSIS — F329 Major depressive disorder, single episode, unspecified: Secondary | ICD-10-CM | POA: Insufficient documentation

## 2016-04-27 DIAGNOSIS — M1990 Unspecified osteoarthritis, unspecified site: Secondary | ICD-10-CM | POA: Diagnosis not present

## 2016-04-27 DIAGNOSIS — Z7982 Long term (current) use of aspirin: Secondary | ICD-10-CM | POA: Insufficient documentation

## 2016-04-27 DIAGNOSIS — E0590 Thyrotoxicosis, unspecified without thyrotoxic crisis or storm: Secondary | ICD-10-CM | POA: Diagnosis not present

## 2016-04-27 DIAGNOSIS — H7292 Unspecified perforation of tympanic membrane, left ear: Secondary | ICD-10-CM | POA: Diagnosis not present

## 2016-04-27 DIAGNOSIS — R55 Syncope and collapse: Secondary | ICD-10-CM | POA: Diagnosis present

## 2016-04-27 DIAGNOSIS — E785 Hyperlipidemia, unspecified: Secondary | ICD-10-CM | POA: Insufficient documentation

## 2016-04-27 DIAGNOSIS — Z87891 Personal history of nicotine dependence: Secondary | ICD-10-CM | POA: Diagnosis not present

## 2016-04-27 LAB — RAPID URINE DRUG SCREEN, HOSP PERFORMED
Amphetamines: NOT DETECTED
BARBITURATES: NOT DETECTED
BENZODIAZEPINES: NOT DETECTED
COCAINE: NOT DETECTED
OPIATES: NOT DETECTED
Tetrahydrocannabinol: POSITIVE — AB

## 2016-04-27 LAB — CBC WITH DIFFERENTIAL/PLATELET
BASOS ABS: 0 10*3/uL (ref 0.0–0.1)
BASOS PCT: 0 %
EOS ABS: 0.2 10*3/uL (ref 0.0–0.7)
Eosinophils Relative: 2 %
HEMATOCRIT: 39.9 % (ref 36.0–46.0)
HEMOGLOBIN: 12.6 g/dL (ref 12.0–15.0)
Lymphocytes Relative: 23 %
Lymphs Abs: 1.8 10*3/uL (ref 0.7–4.0)
MCH: 27.7 pg (ref 26.0–34.0)
MCHC: 31.6 g/dL (ref 30.0–36.0)
MCV: 87.7 fL (ref 78.0–100.0)
Monocytes Absolute: 0.6 10*3/uL (ref 0.1–1.0)
Monocytes Relative: 8 %
NEUTROS ABS: 5 10*3/uL (ref 1.7–7.7)
NEUTROS PCT: 67 %
Platelets: 217 10*3/uL (ref 150–400)
RBC: 4.55 MIL/uL (ref 3.87–5.11)
RDW: 14.8 % (ref 11.5–15.5)
WBC: 7.6 10*3/uL (ref 4.0–10.5)

## 2016-04-27 LAB — URINALYSIS, ROUTINE W REFLEX MICROSCOPIC
Bilirubin Urine: NEGATIVE
Glucose, UA: NEGATIVE mg/dL
HGB URINE DIPSTICK: NEGATIVE
Ketones, ur: NEGATIVE mg/dL
LEUKOCYTES UA: NEGATIVE
NITRITE: NEGATIVE
PROTEIN: NEGATIVE mg/dL
Specific Gravity, Urine: 1.01 (ref 1.005–1.030)
pH: 6 (ref 5.0–8.0)

## 2016-04-27 LAB — COMPREHENSIVE METABOLIC PANEL
ALK PHOS: 105 U/L (ref 38–126)
ALT: 16 U/L (ref 14–54)
ANION GAP: 7 (ref 5–15)
AST: 24 U/L (ref 15–41)
Albumin: 3.9 g/dL (ref 3.5–5.0)
BUN: 8 mg/dL (ref 6–20)
CALCIUM: 8.6 mg/dL — AB (ref 8.9–10.3)
CO2: 26 mmol/L (ref 22–32)
CREATININE: 1.04 mg/dL — AB (ref 0.44–1.00)
Chloride: 106 mmol/L (ref 101–111)
GFR, EST NON AFRICAN AMERICAN: 60 mL/min — AB (ref >60)
Glucose, Bld: 88 mg/dL (ref 65–99)
Potassium: 4 mmol/L (ref 3.5–5.1)
SODIUM: 139 mmol/L (ref 135–145)
Total Bilirubin: 0.5 mg/dL (ref 0.3–1.2)
Total Protein: 6.5 g/dL (ref 6.5–8.1)

## 2016-04-27 LAB — LITHIUM LEVEL: LITHIUM LVL: 0.37 mmol/L — AB (ref 0.60–1.20)

## 2016-04-27 LAB — ETHANOL

## 2016-04-27 LAB — TROPONIN I

## 2016-04-27 MED ORDER — AMOXICILLIN 500 MG PO CAPS: 500.0000 mg | ORAL_CAPSULE | Freq: Three times a day (TID) | ORAL | Status: DC

## 2016-04-27 MED ORDER — SODIUM CHLORIDE 0.9 % IV BOLUS (SEPSIS)
1000.0000 mL | Freq: Once | INTRAVENOUS | Status: AC
  Administered 2016-04-27: 1000 mL via INTRAVENOUS

## 2016-04-27 NOTE — Discharge Instructions (Signed)
Eardrum Perforation The eardrum is a thin, round tissue inside the ear. It allows you to hear. The eardrum can get torn (perforated). Eardrums often heal on their own. There is often little or no long-term hearing loss. HOME CARE  Keep your ear dry while it heals. Do not let your head go under water. Do not swim or dive until your doctor says it is okay.  Before you take a bath or shower, do one of these things to keep water out of your ear:  Put a waterproof earplug in your ear.  Put petroleum jelly all over a cotton ball. Put the cotton ball in your ear.  Take medicines only as told by your doctor.  Avoid blowing your nose if you can. If you blow your nose, do it gently.  Continue your normal activities after your eardrum heals. Your doctor will tell you when your eardrum has healed.  Talk to your doctor before you fly on an airplane.  Keep all doctor follow-up visits as told by your doctor. This is important. GET HELP IF:  You have a fever. GET HELP RIGHT AWAY IF:  You have blood or yellowish-white fluid (pus) coming from your ear.  You feel dizzy or off balance.  You feel sick to your stomach (nauseous), or you throw up (vomit).  You have more pain.   This information is not intended to replace advice given to you by your health care provider. Make sure you discuss any questions you have with your health care provider.   Document Released: 04/30/2010 Document Revised: 12/01/2014 Document Reviewed: 06/19/2014 Elsevier Interactive Patient Education 2016 [employer].    Otitis Media, Adult Otitis media is redness, soreness, and puffiness (swelling) in the space just behind your eardrum (middle ear). It may be caused by allergies or infection. It often happens along with a cold. HOME CARE  Take your medicine as told. Finish it even if you start to feel better.  Only take over-the-counter or prescription medicines for pain, discomfort, or fever as told by your  doctor.  Follow up with your doctor as told. GET HELP IF:  You have otitis media only in one ear, or bleeding from your nose, or both.  You notice a lump on your neck.  You are not getting better in 3-5 days.  You feel worse instead of better. GET HELP RIGHT AWAY IF:   You have pain that is not helped with medicine.  You have puffiness, redness, or pain around your ear.  You get a stiff neck.  You cannot move part of your face (paralysis).  You notice that the bone behind your ear hurts when you touch it. MAKE SURE YOU:   Understand these instructions.  Will watch your condition.  Will get help right away if you are not doing well or get worse.   This information is not intended to replace advice given to you by your health care provider. Make sure you discuss any questions you have with your health care provider.   Document Released: 04/28/2008 Document Revised: 12/01/2014 Document Reviewed: 06/07/2013 Elsevier Interactive Patient Education 2016 [employer].     Syncope Syncope is a medical term for fainting or passing out. This means you lose consciousness and drop to the ground. People are generally unconscious for less than 5 minutes. You may have some muscle twitches for up to 15 seconds before waking up and returning to normal. Syncope occurs more often in older adults, but it can happen to  anyone. While most causes of syncope are not dangerous, syncope can be a sign of a serious medical problem. It is important to seek medical care.  CAUSES  Syncope is caused by a sudden drop in blood flow to the brain. The specific cause is often not determined. Factors that can bring on syncope include:  Taking medicines that lower blood pressure.  Sudden changes in posture, such as standing up quickly.  Taking more medicine than prescribed.  Standing in one place for too long.  Seizure disorders.  Dehydration and excessive exposure to heat.  Low blood sugar  (hypoglycemia).  Straining to have a bowel movement.  Heart disease, irregular heartbeat, or other circulatory problems.  Fear, emotional distress, seeing blood, or severe pain. SYMPTOMS  Right before fainting, you may:  Feel dizzy or light-headed.  Feel nauseous.  See all white or all black in your field of vision.  Have cold, clammy skin. DIAGNOSIS  Your health care provider will ask about your symptoms, perform a physical exam, and perform an electrocardiogram (ECG) to record the electrical activity of your heart. Your health care provider may also perform other heart or blood tests to determine the cause of your syncope which may include:  Transthoracic echocardiogram (TTE). During echocardiography, sound waves are used to evaluate how blood flows through your heart.  Transesophageal echocardiogram (TEE).  Cardiac monitoring. This allows your health care provider to monitor your heart rate and rhythm in real time.  Holter monitor. This is a portable device that records your heartbeat and can help diagnose heart arrhythmias. It allows your health care provider to track your heart activity for several days, if needed.  Stress tests by exercise or by giving medicine that makes the heart beat faster. TREATMENT  In most cases, no treatment is needed. Depending on the cause of your syncope, your health care provider may recommend changing or stopping some of your medicines. HOME CARE INSTRUCTIONS  Have someone stay with you until you feel stable.  Do not drive, use machinery, or play sports until your health care provider says it is okay.  Keep all follow-up appointments as directed by your health care provider.  Lie down right away if you start feeling like you might faint. Breathe deeply and steadily. Wait until all the symptoms have passed.  Drink enough fluids to keep your urine clear or pale yellow.  If you are taking blood pressure or heart medicine, get up slowly and  take several minutes to sit and then stand. This can reduce dizziness. SEEK IMMEDIATE MEDICAL CARE IF:   You have a severe headache.  You have unusual pain in the chest, abdomen, or back.  You are bleeding from your mouth or rectum, or you have black or tarry stool.  You have an irregular or very fast heartbeat.  You have pain with breathing.  You have repeated fainting or seizure-like jerking during an episode.  You faint when sitting or lying down.  You have confusion.  You have trouble walking.  You have severe weakness.  You have vision problems. If you fainted, call your local emergency services (911 in [place] Do not drive yourself to the hospital.    This information is not intended to replace advice given to you by your health care provider. Make sure you discuss any questions you have with your health care provider.   Document Released: 11/10/2005 Document Revised: 03/27/2015 Document Reviewed: 01/09/2012 Elsevier Interactive Patient Education [employer].

## 2016-04-27 NOTE — ED Notes (Signed)
Pt states she has been having bleeding out of her left ear for the past 3 days.  Yesterday was sitting on her friend's deck and began not feeling well and passed out.

## 2016-04-27 NOTE — ED Provider Notes (Signed)
CSN: [id number]     Arrival date & time 04/27/16  1319 History   First MD Initiated Contact with Patient 04/27/16 1332     Chief Complaint  Patient presents with  . Loss of Consciousness     (Consider location/radiation/quality/duration/timing/severity/associated sxs/prior Treatment) HPI   Sheryl Hall is a 55 y.o. female who is here for evaluation of an episode of syncope yesterday. She was sitting on a couch, outside, stood up, took a couple steps, then passed out. She injured her right hip in the fall. Her friend was with her and states that she was unconscious for 4 or 5 seconds then somewhat groggy for about 10 minutes. There was no visualized shaking. Since that time she has been ambulating easily. She denies headache, neck pain or back pain. She has mild right hip pain with walking. She has also been having some bleeding from her left ear, for 3 days, intermittently since she had an episode of gagging and felt a popping sensation in the left ear. She has decreased hearing in the left ear at this time. She denies fever, cough, shortness of breath. No paresthesia or focal weakness. There are no other known modifying factors.   Past Medical History  Diagnosis Date  . PTSD (post-traumatic stress disorder)   . Bipolar 1 disorder ([place])   . Heart murmur   . Diverticulosis of colon   . Keratosis, actinic   . Family hx of colon cancer     age 78-father  . GERD (gastroesophageal reflux disease)   . Chronic nausea   . S/P endoscopy 05/30/2010    Dr Rourk-> non--critical Schatzki's ring, s/p 59F dilation  . S/P colonoscopy 05/30/2010    LAX sphincter tone, anal papilla, left-sided diverticulosis, normal random biopsies,, 1 polyp-TA  . Polysubstance abuse   . Tubular adenoma of colon 05/30/2010    Next colonoscopy 05/2015  . Anxiety   . Depression   . Hyperthyroidism   . Hyperlipidemia   . Folate deficiency   . Hx of abnormal Pap smear   . Low back pain 03/01/2013    MRI with multiple  levels of disc bulge  . Chronic left hip pain   . Avascular necrosis of hip ([place])     left  . UTI (urinary tract infection)   . Stroke [facility]) 2014    Right parietal, no deficits   . Arthritis    Past Surgical History  Procedure Laterality Date  . External ear surgery Left 12 years ago    skin graft from behind ear put in ear canal  . Neck surgery  10 years ago  . Colonoscopy      every 5 years  . Skin cancer destruction    . 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 Joint replacement    . Esophagogastroduodenoscopy  05/30/2010    [id number] ring/small HH otherwise normal  . Colonoscopy  05/30/2010    RMR:lax anal sphincter tone,anal papilla,otherwise normal/left-sided diverticula  . Total hip arthroplasty Right 05/23/2015    Procedure: RIGHT TOTAL HIP ARTHROPLASTY ANTERIOR APPROACH;  Surgeon: Gaynelle Arabian, MD;  Location: [facility];  Service: Orthopedics;  Laterality: Right;   Family History  Problem Relation Age of Onset  . Diabetes Mother   . Hypertension Mother   . Colon cancer Father 63  . Hypertension Father   .  Hypertension Sister   . Heart attack Brother    Social History  Substance Use Topics  . Smoking status: Former Smoker -- 0.25 packs/day for 30 years    Types: Cigarettes  . Smokeless tobacco: Never Used     Comment: Quit June 2016  . Alcohol Use: No     Comment: social   OB History    Gravida Para Term Preterm AB TAB SAB Ectopic Multiple Living   2 1 1  1  1   1      Review of Systems  All other systems reviewed and are negative.     Allergies  Review of patient's allergies indicates no known allergies.  Home Medications   Prior to Admission medications   Medication Sig Start Date End Date Taking? Authorizing Provider  amoxicillin (AMOXIL) 500 MG capsule Take 1 capsule (500 mg total) by  mouth 3 (three) times daily. 04/27/16   Daleen Bo, MD  aspirin EC 81 MG tablet Take 81 mg by mouth daily.    Historical Provider, MD  clonazePAM (KLONOPIN) 1 MG tablet 1 tab po BID prn anxiety Patient taking differently: Take 0.5 mg by mouth 3 (three) times daily.  02/23/15   Alycia Rossetti, MD  esomeprazole (NEXIUM) 40 MG capsule Take 1 capsule (40 mg total) by mouth daily. 03/27/16   Susy Frizzle, MD  lamoTRIgine (LAMICTAL) 100 MG tablet Take 2.5 tablets (250 mg total) by mouth every morning. Takes 200mg  with 25mg  for a total of 225mg  daily Patient taking differently: Take by mouth. Takes 100mg  with 25mg  for a total of 125mg  every morning and 100mg  at night 02/23/15   Alycia Rossetti, MD  lamoTRIgine (LAMICTAL) 25 MG tablet Take 2 tablets (50 mg total) by mouth every morning. Takes 200mg  with 25mg  for a total of 225mg  daily Patient taking differently: Takes 100mg  with 25mg  for a total of 125mg  every morning and 100mg  at night 02/23/15   Alycia Rossetti, MD  levETIRAcetam (KEPPRA) 250 MG tablet TAKE 1 TABLET BY MOUTH IN THE MORNING AND TAKE 2 TABLETS AT BEDTIME. 12/31/15   Ward Givens, NP  levothyroxine (SYNTHROID, LEVOTHROID) 50 MCG tablet TAKE ONE TABLET BY MOUTH ONCE DAILY. 03/24/16   Susy Frizzle, MD  lithium carbonate (ESKALITH) 450 MG CR tablet Take by mouth at bedtime.    Historical Provider, MD  traZODone (DESYREL) 100 MG tablet Take 1 tablet (100 mg total) by mouth at bedtime. Patient taking differently: Take 50 mg by mouth at bedtime.  02/23/15   Alycia Rossetti, MD  valACYclovir (VALTREX) 500 MG tablet Take 1 tablet (500 mg total) by mouth daily. 04/08/16   Alycia Rossetti, MD   BP 133/75 mmHg  Pulse 67  Temp(Src) 98 F (36.7 C) (Oral)  Resp 13  Ht 5\' 6"  (1.676 m)  Wt 167 lb (75.751 kg)  BMI 26.97 kg/m2  SpO2 99% Physical Exam  Constitutional: She is oriented to person, place, and time. She appears well-developed and well-nourished. No distress.  HENT:  Head: Normocephalic  and atraumatic.  Right Ear: External ear normal.  Left Ear: External ear normal.  Left tympanic membrane, thickened, opaque, with a small amount of blood posteriorly. No clearly visualized defect. No purulence in the external auditory canal. No tenderness of the tragus.  Eyes: Conjunctivae and EOM are normal. Pupils are equal, round, and reactive to light.  Neck: Normal range of motion and phonation normal. Neck supple.  Cardiovascular: Normal rate, regular rhythm and normal  heart sounds.   Pulmonary/Chest: Effort normal and breath sounds normal. She exhibits no bony tenderness.  Abdominal: Soft. There is no tenderness.  Musculoskeletal: Normal range of motion. She exhibits no tenderness.  Neurological: She is alert and oriented to person, place, and time. No cranial nerve deficit or sensory deficit. She exhibits normal muscle tone. Coordination normal.  No dysarthria, aphasia or nystagmus.  Skin: Skin is warm, dry and intact.  Psychiatric: She has a normal mood and affect. Her behavior is normal. Judgment and thought content normal.  Nursing note and vitals reviewed.   ED Course  Procedures (including critical care time) Medications  sodium chloride 0.9 % bolus 1,000 mL (0 mLs Intravenous Stopped 04/27/16 1610)    Patient Vitals for the past 24 hrs:  BP Temp Temp src Pulse Resp SpO2 Height Weight  04/27/16 1612 133/75 mmHg 98 F (36.7 C) - 67 13 99 % - -  04/27/16 1530 110/88 mmHg - - 63 15 97 % - -  04/27/16 1500 117/81 mmHg - - 63 18 98 % - -  04/27/16 1437 104/86 mmHg - - 75 16 96 % - -  04/27/16 1430 124/83 mmHg - - 65 12 98 % - -  04/27/16 1400 124/68 mmHg - - 71 15 97 % - -  04/27/16 1325 123/89 mmHg 98.6 F (37 C) Oral 75 18 100 % 5\' 6"  (1.676 m) 167 lb (75.751 kg)    4:05 PM Reevaluation with update and discussion. After initial assessment and treatment, an updated evaluation reveals No change in clinical status. Findings discussed with patient and friend, all questions  were answered. Rakel Junio L     Labs Review Labs Reviewed  COMPREHENSIVE METABOLIC PANEL - Abnormal; Notable for the following:    Creatinine, Ser 1.04 (*)    Calcium 8.6 (*)    GFR calc non Af Amer 60 (*)    All other components within normal limits  URINALYSIS, ROUTINE W REFLEX MICROSCOPIC (NOT AT [facility]) - Abnormal; Notable for the following:    APPearance HAZY (*)    All other components within normal limits  URINE RAPID DRUG SCREEN, HOSP PERFORMED - Abnormal; Notable for the following:    Tetrahydrocannabinol POSITIVE (*)    All other components within normal limits  LITHIUM LEVEL - Abnormal; Notable for the following:    Lithium Lvl 0.37 (*)    All other components within normal limits  CBC WITH DIFFERENTIAL/PLATELET  TROPONIN I  ETHANOL    Imaging Review No results found. I have personally reviewed and evaluated these images and lab results as part of my medical decision-making.   EKG Interpretation   Date/Time:  Sunday April 27 2016 13:43:43 EDT Ventricular Rate:  71 PR Interval:  178 QRS Duration: 79 QT Interval:  380 QTC Calculation: 413 R Axis:   59 Text Interpretation:  Sinus rhythm Since last tracing rate faster  Confirmed by Pratt Bress  MD, Maleigha Colvard [phone]) on 04/27/2016 2:37:46 PM      MDM   Final diagnoses:  Syncope, unspecified syncope type  Otitis media, chronic with perforation, left    Syncope, likely vasovagal. No significant injury. Unrelated left otitis media with perforation. Doubt sepsis, metabolic instability or impending vascular collapse.  Nursing Notes Reviewed/ Care Coordinated Applicable Imaging Reviewed Interpretation of Laboratory Data incorporated into ED treatment  The patient appears reasonably screened and/or stabilized for discharge and I doubt any other medical condition or other [facility] requiring further screening, evaluation, or treatment in the ED at  this time prior to discharge.  Plan: Home Medications- Amox; Home Treatments-  rest; return here if the recommended treatment, does not improve the symptoms; Recommended follow up- PCP 1 week     Daleen Bo, MD 04/27/16 1614

## 2016-05-07 ENCOUNTER — Ambulatory Visit (HOSPITAL_COMMUNITY)
Admission: RE | Admit: 2016-05-07 | Discharge: 2016-05-07 | Disposition: A | Discharge status: Home / Self Care | Payer: Medicaid Other | Source: Ambulatory Visit | Attending: Family Medicine | Admitting: Family Medicine

## 2016-05-13 ENCOUNTER — Ambulatory Visit (HOSPITAL_COMMUNITY)
Admission: RE | Admit: 2016-05-13 | Discharge: 2016-05-13 | Disposition: A | Discharge status: Home / Self Care | Payer: Medicaid Other | Source: Ambulatory Visit | Attending: Family Medicine | Admitting: Family Medicine

## 2016-05-13 DIAGNOSIS — E041 Nontoxic single thyroid nodule: Secondary | ICD-10-CM

## 2016-05-13 DIAGNOSIS — E0590 Thyrotoxicosis, unspecified without thyrotoxic crisis or storm: Secondary | ICD-10-CM

## 2016-05-15 ENCOUNTER — Encounter: Payer: Self-pay | Admitting: Family Medicine

## 2016-05-15 ENCOUNTER — Ambulatory Visit (INDEPENDENT_AMBULATORY_CARE_PROVIDER_SITE_OTHER): Payer: Medicaid Other | Admitting: Family Medicine

## 2016-05-15 VITALS — BP 100/62 | HR 78 | Temp 98.0°F | Resp 16 | Wt 171.0 lb

## 2016-05-15 DIAGNOSIS — H7292 Unspecified perforation of tympanic membrane, left ear: Secondary | ICD-10-CM | POA: Diagnosis not present

## 2016-05-15 NOTE — Progress Notes (Signed)
Subjective:    Patient ID: Sheryl Hall, female    DOB: April 25, 1961, 55 y.o.   MRN: [id number]  HPI Was seen in the emergency room earlier in June after a syncopal episode that seems like it may have been related to orthostatic hypotension. Was diagnosed with a perforated eardrum. She is here today to follow-up. On examination today the right tympanic membrane is healthy, pearly gray, with no erythema or middle ear effusion. In the left external auditory canal there is a peripheral rim of scar tissue.. There appears to be a large central defect. There is no active bleeding. Hearing is reduced in the left ear. Past Medical History  Diagnosis Date  . PTSD (post-traumatic stress disorder)   . Bipolar 1 disorder ([place])   . Heart murmur   . Diverticulosis of colon   . Keratosis, actinic   . Family hx of colon cancer     age 67-father  . GERD (gastroesophageal reflux disease)   . Chronic nausea   . S/P endoscopy 05/30/2010    Dr Rourk-> non--critical Schatzki's ring, s/p 33F dilation  . S/P colonoscopy 05/30/2010    LAX sphincter tone, anal papilla, left-sided diverticulosis, normal random biopsies,, 1 polyp-TA  . Polysubstance abuse   . Tubular adenoma of colon 05/30/2010    Next colonoscopy 05/2015  . Anxiety   . Depression   . Hyperthyroidism   . Hyperlipidemia   . Folate deficiency   . Hx of abnormal Pap smear   . Low back pain 03/01/2013    MRI with multiple levels of disc bulge  . Chronic left hip pain   . Avascular necrosis of hip ([place])     left  . UTI (urinary tract infection)   . Stroke [facility]) 2014    Right parietal, no deficits   . Arthritis    Past Surgical History  Procedure Laterality Date  . External ear surgery Left 12 years ago    skin graft from behind ear put in ear canal  . Neck surgery  10 years ago  . Colonoscopy      every 5 years  . Skin cancer destruction    . 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 Joint replacement    . Esophagogastroduodenoscopy  05/30/2010    [id number] ring/small HH otherwise normal  . Colonoscopy  05/30/2010    RMR:lax anal sphincter tone,anal papilla,otherwise normal/left-sided diverticula  . Total hip arthroplasty Right 05/23/2015    Procedure: RIGHT TOTAL HIP ARTHROPLASTY ANTERIOR APPROACH;  Surgeon: Gaynelle Arabian, MD;  Location: [facility];  Service: Orthopedics;  Laterality: Right;   Current Outpatient Prescriptions on File Prior to Visit  Medication Sig Dispense Refill  . aspirin EC 81 MG tablet Take 81 mg by mouth daily.    . clonazePAM (KLONOPIN) 1 MG tablet 1 tab po BID prn anxiety (Patient taking differently: Take 0.5 mg by mouth 3 (three) times daily. ) 60 tablet 0  . esomeprazole (NEXIUM) 40 MG capsule Take 1 capsule (40 mg total) by mouth daily. 30 capsule 11  . levETIRAcetam (KEPPRA) 250 MG tablet TAKE 1 TABLET BY MOUTH IN THE MORNING AND TAKE 2 TABLETS AT BEDTIME. 90 tablet 6  . levothyroxine (SYNTHROID, LEVOTHROID) 50 MCG tablet TAKE ONE TABLET BY MOUTH ONCE DAILY. 30 tablet 3  . traZODone (DESYREL) 100 MG tablet Take 1  tablet (100 mg total) by mouth at bedtime. (Patient taking differently: Take 50 mg by mouth at bedtime. ) 30 tablet 0  . valACYclovir (VALTREX) 500 MG tablet Take 1 tablet (500 mg total) by mouth daily. 30 tablet 6   No current facility-administered medications on file prior to visit.   No Known Allergies Social History   Social History  . Marital Status: Divorced    Spouse Name: N/A  . Number of Children: 1  . Years of Education: N/A   Occupational History  . disabled    Social History Main Topics  . Smoking status: Former Smoker -- 0.25 packs/day for 30 years    Types: Cigarettes  . Smokeless tobacco: Never Used     Comment: Quit June 2016  . Alcohol Use: No     Comment: social  . Drug Use: No     Comment: hx cocaine  abuse  . Sexual Activity: No   Other Topics Concern  . Not on file   Social History Narrative   Patient lives at home alone and she is single.    Disabled.   Education college education.   Right handed.   Caffeine mountain dew four daily.      Review of Systems  All other systems reviewed and are negative.      Objective:   Physical Exam  Constitutional: She appears well-developed and well-nourished.  HENT:  Left Ear: Tympanic membrane is scarred and perforated.  Cardiovascular: Normal rate, regular rhythm and normal heart sounds.   Pulmonary/Chest: Effort normal and breath sounds normal.  Vitals reviewed.         Assessment & Plan:  Tympanic membrane perforation, left - Plan: Ambulatory referral to ENT  I will consult ENT regarding the perforated eardrum. She has seen Dr. Richardson Landry in the past and thus will consult again as he has removed her previous medical history. She also has bipolar syndrome/disorder. Recently her psychiatrist recommended that she start Tegretol 300 mg by mouth twice a day as a mood stabilizer. She is concerned by taking Lamictal, Tegretol, Keppra, and Klonopin altogether. I recommended that she start the Tegretol but I did suggest that she discontinue the Lamictal as she has not seen any benefit at this. I will recheck the patient back in one month. She no longer on follow-up of her psychiatrist. I will try to manage her as best I can until he can find a different psychiatrist for consultation.

## 2016-05-28 ENCOUNTER — Encounter: Payer: Self-pay | Admitting: Gastroenterology

## 2016-05-28 ENCOUNTER — Other Ambulatory Visit: Payer: Self-pay

## 2016-05-28 ENCOUNTER — Ambulatory Visit (INDEPENDENT_AMBULATORY_CARE_PROVIDER_SITE_OTHER): Payer: Medicaid Other | Admitting: Gastroenterology

## 2016-05-28 VITALS — BP 157/82 | HR 72 | Temp 97.2°F | Ht 66.0 in | Wt 170.2 lb

## 2016-05-28 DIAGNOSIS — Z86010 Personal history of colonic polyps: Secondary | ICD-10-CM

## 2016-05-28 DIAGNOSIS — R1310 Dysphagia, unspecified: Secondary | ICD-10-CM

## 2016-05-28 DIAGNOSIS — K589 Irritable bowel syndrome without diarrhea: Secondary | ICD-10-CM

## 2016-05-28 DIAGNOSIS — K3184 Gastroparesis: Secondary | ICD-10-CM

## 2016-05-28 DIAGNOSIS — D126 Benign neoplasm of colon, unspecified: Secondary | ICD-10-CM

## 2016-05-28 MED ORDER — PEG 3350-KCL-NA BICARB-NACL 420 G PO SOLR: 4000.0000 mL | ORAL | Status: DC

## 2016-05-28 NOTE — Patient Instructions (Signed)
Start taking the samples of Linzess 72 mcg once each morning, 30 minutes before breakfast. Even if you have loose stool, keep taking for at least a week to see if it gets better. If not, call me.  We have scheduled you for a colonoscopy and upper endoscopy with dilation in the near future.   Further recommendations to follow.   We will see you in 3 months.

## 2016-05-28 NOTE — Progress Notes (Signed)
Referring Provider: Susy Frizzle, MD Primary Care Physician:  Odette Fraction, MD  Primary GI: Dr. Gala Romney   Chief Complaint  Patient presents with  . Constipation  . Bloated  . Abdominal Pain    HPI:   Sheryl Hall is a 55 y.o. female presenting today with a history of IBS-C, chronic GERD, gastroparesis, due for colonoscopy now secondary to history of tubular adenomas.   Small amounts of stool throughout the day.  Small amounts of stool at a time. Some days no BM. Feels bloated. Feels like she wants to have a BM but can't. Lower abdominal discomfort. No rectal bleeding. Nausea constant but no vomiting. Reglan without much improvement in the past. Last EGD in 2011. At times notes solid food dysphagia. No NSAIDs or aspirin powders. Has tried Linzess 145 in past but had diarrhea.   Past Medical History  Diagnosis Date  . PTSD (post-traumatic stress disorder)   . Bipolar 1 disorder ([place])   . Heart murmur   . Diverticulosis of colon   . Keratosis, actinic   . Family hx of colon cancer     age 54-father  . GERD (gastroesophageal reflux disease)   . Chronic nausea   . S/P endoscopy 05/30/2010    Dr Rourk-> non--critical Schatzki's ring, s/p 63F dilation  . S/P colonoscopy 05/30/2010    LAX sphincter tone, anal papilla, left-sided diverticulosis, normal random biopsies,, 1 polyp-TA  . Polysubstance abuse   . Tubular adenoma of colon 05/30/2010    Next colonoscopy 05/2015  . Anxiety   . Depression   . Hyperthyroidism   . Hyperlipidemia   . Folate deficiency   . Hx of abnormal Pap smear   . Low back pain 03/01/2013    MRI with multiple levels of disc bulge  . Chronic left hip pain   . Avascular necrosis of hip ([place])     left  . UTI (urinary tract infection)   . Stroke [facility]) 2014    Right parietal, no deficits   . Arthritis     Past Surgical History  Procedure Laterality Date  . External ear surgery Left 12 years ago    skin graft from behind ear put in ear canal    . Neck surgery  10 years ago  . Colonoscopy      every 5 years  . Skin cancer destruction    . 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 Joint replacement    . Esophagogastroduodenoscopy  05/30/2010    [id number] ring/small HH otherwise normal  . Colonoscopy  05/30/2010    RMR:lax anal sphincter tone,anal papilla,otherwise normal/left-sided diverticula  . Total hip arthroplasty Right 05/23/2015    Procedure: RIGHT TOTAL HIP ARTHROPLASTY ANTERIOR APPROACH;  Surgeon: Gaynelle Arabian, MD;  Location: [facility];  Service: Orthopedics;  Laterality: Right;    Current Outpatient Prescriptions  Medication Sig Dispense Refill  . aspirin EC 81 MG tablet Take 81 mg by mouth daily.    . clonazePAM (KLONOPIN) 1 MG tablet 1 tab po BID prn anxiety (Patient taking differently: Take 0.5 mg by mouth 3 (three) times daily as needed. ) 60 tablet 0  . esomeprazole (NEXIUM) 40 MG capsule Take 1 capsule (40 mg total) by mouth daily. 30 capsule 11  . levETIRAcetam (KEPPRA) 250 MG tablet TAKE 1 TABLET BY MOUTH IN THE  MORNING AND TAKE 2 TABLETS AT BEDTIME. 90 tablet 6  . levothyroxine (SYNTHROID, LEVOTHROID) 50 MCG tablet TAKE ONE TABLET BY MOUTH ONCE DAILY. 30 tablet 3  . Oxcarbazepine (TRILEPTAL) 300 MG tablet Take 300 mg by mouth 2 (two) times daily.    . traZODone (DESYREL) 100 MG tablet Take 1 tablet (100 mg total) by mouth at bedtime. (Patient taking differently: Take 50 mg by mouth at bedtime. ) 30 tablet 0  . valACYclovir (VALTREX) 500 MG tablet Take 1 tablet (500 mg total) by mouth daily. 30 tablet 6  . lamoTRIgine (LAMICTAL) 100 MG tablet Take 100 mg by mouth 2 (two) times daily. Reported on 05/28/2016     No current facility-administered medications for this visit.    Allergies as of 05/28/2016  . (No Known Allergies)    Family History  Problem  Relation Age of Onset  . Diabetes Mother   . Hypertension Mother   . Colon cancer Father 18  . Hypertension Father   . Hypertension Sister   . Heart attack Brother     Social History   Social History  . Marital Status: Divorced    Spouse Name: N/A  . Number of Children: 1  . Years of Education: N/A   Occupational History  . disabled    Social History Main Topics  . Smoking status: Former Smoker -- 0.25 packs/day for 30 years    Types: Cigarettes  . Smokeless tobacco: Never Used     Comment: Quit June 2016  . Alcohol Use: No     Comment: social  . Drug Use: No     Comment: hx cocaine abuse  . Sexual Activity: No   Other Topics Concern  . None   Social History Narrative   Patient lives at home alone and she is single.    Disabled.   Education college education.   Right handed.   Caffeine mountain dew four daily.     Review of Systems: Negative unless mentioned in HPI   Physical Exam: BP 157/82 mmHg  Pulse 72  Temp(Src) 97.2 F (36.2 C) (Oral)  Ht 5\' 6"  (1.676 m)  Wt 170 lb 3.2 oz (77.202 kg)  BMI 27.48 kg/m2 General:   Alert and oriented. No distress noted. Pleasant and cooperative.  Head:  Normocephalic and atraumatic. Eyes:  Conjuctiva clear without scleral icterus. Mouth:  Oral mucosa pink and moist. Heart:  S1, S2 present without murmurs, rubs, or gallops. Regular rate and rhythm. Abdomen:  +BS, soft, non-tender and non-distended. No rebound or guarding. No HSM or masses noted. Msk:  Symmetrical without gross deformities. Normal posture. Extremities:  Without edema. Neurologic:  Alert and  oriented x4;  grossly normal neurologically. Psych:  Alert and cooperative. Normal mood and affect.

## 2016-05-29 ENCOUNTER — Encounter (HOSPITAL_COMMUNITY)
Admission: RE | Admit: 2016-05-29 | Discharge: 2016-05-29 | Disposition: A | Discharge status: Home / Self Care | Payer: Medicaid Other | Source: Ambulatory Visit | Attending: Internal Medicine | Admitting: Internal Medicine

## 2016-05-29 ENCOUNTER — Telehealth: Payer: Self-pay

## 2016-05-29 NOTE — Telephone Encounter (Signed)
Noted  

## 2016-05-29 NOTE — Telephone Encounter (Signed)
Pt called to cancel her TCS on Monday because she did not have a ride

## 2016-06-01 NOTE — Assessment & Plan Note (Signed)
55 year old female with need for surveillance due to history of adenomas, also with family history of colon cancer in father. No concerning lower GI symptoms.   Proceed with TCS with Dr. Gala Romney in near future: the risks, benefits, and alternatives have been discussed with the patient in detail. The patient states understanding and desires to proceed. PROPOFOL due to polypharmacy

## 2016-06-01 NOTE — Assessment & Plan Note (Signed)
Constipation predominant. Linzess 72 mcg, as Linzess 145 appears too strong.

## 2016-06-01 NOTE — Assessment & Plan Note (Signed)
Solid food dysphagia, with last EGD in 2011 noting non-critical Schatzki's ring s/p dilation. Will proceed with EGD at time of colonoscopy.   Proceed with upper endoscopy/dilation in the near future with Dr. Gala Romney. The risks, benefits, and alternatives have been discussed in detail with patient. They have stated understanding and desire to proceed.  PROPOFOL due to polypharmacy

## 2016-06-01 NOTE — Assessment & Plan Note (Signed)
Persistent nausea but without vomiting. No improvement with Reglan historically. Desires to wait until after EGD prior to beginning any other therapies.

## 2016-06-02 ENCOUNTER — Ambulatory Visit (HOSPITAL_COMMUNITY): Admit: 2016-06-02 | Payer: Medicaid Other | Admitting: Internal Medicine

## 2016-06-02 ENCOUNTER — Encounter (HOSPITAL_COMMUNITY): Payer: Self-pay

## 2016-06-02 SURGERY — COLONOSCOPY WITH PROPOFOL
Anesthesia: Monitor Anesthesia Care

## 2016-06-02 NOTE — Progress Notes (Signed)
CC'ED TO PCP 

## 2016-06-09 ENCOUNTER — Telehealth: Payer: Self-pay | Admitting: Internal Medicine

## 2016-06-09 MED ORDER — LINACLOTIDE 72 MCG PO CAPS: 72.0000 ug | ORAL_CAPSULE | Freq: Every day | ORAL | Status: DC

## 2016-06-09 NOTE — Telephone Encounter (Signed)
[phone] PT CALLED AND STATED THE SAMPLES OF THE 72MCG OF LINZESS WORKED WELL AND SHE NEEDS A SCRIPT CALLED INTO HER PHARMACY   [name]

## 2016-06-09 NOTE — Telephone Encounter (Signed)
Routing to the refill box. 

## 2016-06-09 NOTE — Addendum Note (Signed)
Addended by: Gordy Levan, [place]nee Higham A on: 06/09/2016 01:23 PM   Modules accepted: Orders

## 2016-06-09 NOTE — Telephone Encounter (Signed)
Please notify the patient that the medication was sent to the pharmacy. Offer a copay card if [employer].

## 2016-06-16 ENCOUNTER — Encounter: Payer: Self-pay | Admitting: Family Medicine

## 2016-06-16 ENCOUNTER — Ambulatory Visit (INDEPENDENT_AMBULATORY_CARE_PROVIDER_SITE_OTHER): Payer: Medicaid Other | Admitting: Family Medicine

## 2016-06-16 VITALS — BP 138/88 | HR 100 | Temp 98.7°F | Resp 18 | Ht 66.25 in | Wt 170.0 lb

## 2016-06-16 DIAGNOSIS — F319 Bipolar disorder, unspecified: Secondary | ICD-10-CM | POA: Diagnosis not present

## 2016-06-16 NOTE — Progress Notes (Signed)
Subjective:    Patient ID: Sheryl Hall, female    DOB: 18-Jul-1961, 55 y.o.   MRN: [id number]  HPI 05/15/16 Was seen in the emergency room earlier in June after a syncopal episode that seems like it may have been related to orthostatic hypotension. Was diagnosed with a perforated eardrum. She is here today to follow-up. On examination today the right tympanic membrane is healthy, pearly gray, with no erythema or middle ear effusion. In the left external auditory canal there is a peripheral rim of scar tissue.. There appears to be a large central defect. There is no active bleeding. Hearing is reduced in the left ear.  At that time, my plan was: I will consult ENT regarding the perforated eardrum. She has seen Dr. Richardson Landry in the past and thus will consult again as he has removed her previous medical history. She also has bipolar syndrome/disorder. Recently her psychiatrist recommended that she start Tegretol 300 mg by mouth twice a day as a mood stabilizer. She is concerned by taking Lamictal, Tegretol, Keppra, and Klonopin altogether. I recommended that she start the Tegretol but I did suggest that she discontinue the Lamictal as she has not seen any benefit at this. I will recheck the patient back in one month. She no longer on follow-up of her psychiatrist. I will try to manage her as best I can until he can find a different psychiatrist for consultation.  06/16/16 Patient presents today for follow-up.  Patient states that she feels better since discontinuing the medical. However she continues to have symptoms of mania. She reports a racing and intrusive thoughts. She is having terrible nightmares. For instance in one nightmare she was "being held captive by a cult."  She also reports insomnia. She denies any delusional behavior or hallucinations. She denies any depression or suicidal ideation. She is asking for assistance with helping the intrusive thoughts and the insomnia. Past Medical History:    Diagnosis Date  . Anxiety   . Arthritis   . Avascular necrosis of hip ([place])    left  . Bipolar 1 disorder ([place])   . Chronic left hip pain   . Chronic nausea   . Depression   . Diverticulosis of colon   . Family hx of colon cancer    age 86-father  . Folate deficiency   . GERD (gastroesophageal reflux disease)   . Heart murmur   . Hx of abnormal Pap smear   . Hyperlipidemia   . Hyperthyroidism   . Keratosis, actinic   . Low back pain 03/01/2013   MRI with multiple levels of disc bulge  . Polysubstance abuse   . PTSD (post-traumatic stress disorder)   . S/P colonoscopy 05/30/2010   LAX sphincter tone, anal papilla, left-sided diverticulosis, normal random biopsies,, 1 polyp-TA  . S/P endoscopy 05/30/2010   Dr Rourk-> non--critical Schatzki's ring, s/p 25F dilation  . Stroke [facility]) 2014   Right parietal, no deficits   . Tubular adenoma of colon 05/30/2010   Next colonoscopy 05/2015  . UTI (urinary tract infection)    Past Surgical History:  Procedure Laterality Date  . COLONOSCOPY     every 5 years  . COLONOSCOPY  05/30/2010   RMR:lax anal sphincter tone,anal papilla,otherwise normal/left-sided diverticula  . ESOPHAGOGASTRODUODENOSCOPY  05/30/2010   [id number] ring/small HH otherwise normal  . EXTERNAL EAR SURGERY Left 12 years ago   skin graft from behind ear put in ear canal  . JOINT REPLACEMENT    . NECK  SURGERY  10 years ago  . SKIN CANCER DESTRUCTION    . 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 TOTAL HIP ARTHROPLASTY Right 05/23/2015   Procedure: RIGHT TOTAL HIP ARTHROPLASTY ANTERIOR APPROACH;  Surgeon: Gaynelle Arabian, MD;  Location: [facility];  Service: Orthopedics;  Laterality: Right;   Current Outpatient Prescriptions on File Prior to Visit  Medication Sig Dispense Refill  . aspirin EC 81 MG tablet Take 81 mg by  mouth daily.    . clonazePAM (KLONOPIN) 1 MG tablet 1 tab po BID prn anxiety (Patient taking differently: Take 0.5 mg by mouth 3 (three) times daily as needed. ) 60 tablet 0  . esomeprazole (NEXIUM) 40 MG capsule Take 1 capsule (40 mg total) by mouth daily. 30 capsule 11  . levETIRAcetam (KEPPRA) 250 MG tablet TAKE 1 TABLET BY MOUTH IN THE MORNING AND TAKE 2 TABLETS AT BEDTIME. 90 tablet 6  . levothyroxine (SYNTHROID, LEVOTHROID) 50 MCG tablet TAKE ONE TABLET BY MOUTH ONCE DAILY. 30 tablet 3  . linaclotide (LINZESS) 72 MCG capsule Take 1 capsule (72 mcg total) by mouth daily before breakfast. 90 capsule 2  . Oxcarbazepine (TRILEPTAL) 300 MG tablet Take 300 mg by mouth 2 (two) times daily.    . polyethylene glycol-electrolytes (TRILYTE) 420 g solution Take 4,000 mLs by mouth as directed. 4000 mL 0  . traZODone (DESYREL) 100 MG tablet Take 1 tablet (100 mg total) by mouth at bedtime. (Patient taking differently: Take 50 mg by mouth at bedtime. ) 30 tablet 0  . valACYclovir (VALTREX) 500 MG tablet Take 1 tablet (500 mg total) by mouth daily. 30 tablet 6   No current facility-administered medications on file prior to visit.    No Known Allergies Social History   Social History  . Marital status: Divorced    Spouse name: N/A  . Number of children: 1  . Years of education: N/A   Occupational History  . disabled    Social History Main Topics  . Smoking status: Former Smoker    Packs/day: 0.25    Years: 30.00    Types: Cigarettes  . Smokeless tobacco: Never Used     Comment: Quit June 2016  . Alcohol use No     Comment: social  . Drug use: No     Comment: hx cocaine abuse  . Sexual activity: No   Other Topics Concern  . Not on file   Social History Narrative   Patient lives at home alone and she is single.    Disabled.   Education college education.   Right handed.   Caffeine mountain dew four daily.      Review of Systems  All other systems reviewed and are negative.       Objective:   Physical Exam  Constitutional: She appears well-developed and well-nourished.  Cardiovascular: Normal rate, regular rhythm and normal heart sounds.   Pulmonary/Chest: Effort normal and breath sounds normal.  Vitals reviewed.         Assessment & Plan:  Bipolar I disorder (HCC)  Increase Trileptal to 450 mg at night. Continue 300 mg in the morning. Maximum dose of Trileptal is 1200 mg a day. However, I'm hesitant to push it to the maximum dose given the fact that she is also on Keppra (for seizures) due to potential side effects and sedation. Recheck in  3 weeks

## 2016-06-19 ENCOUNTER — Other Ambulatory Visit: Payer: Self-pay | Admitting: Family Medicine

## 2016-06-19 DIAGNOSIS — Z1231 Encounter for screening mammogram for malignant neoplasm of breast: Secondary | ICD-10-CM

## 2016-06-22 ENCOUNTER — Encounter (HOSPITAL_COMMUNITY): Payer: Self-pay | Admitting: Emergency Medicine

## 2016-06-22 ENCOUNTER — Emergency Department (HOSPITAL_COMMUNITY): Payer: Medicaid Other

## 2016-06-22 ENCOUNTER — Emergency Department (HOSPITAL_COMMUNITY)
Admission: EM | Admit: 2016-06-22 | Discharge: 2016-06-22 | Disposition: A | Discharge status: Home / Self Care | Payer: Medicaid Other | Attending: Emergency Medicine | Admitting: Emergency Medicine

## 2016-06-22 DIAGNOSIS — G40909 Epilepsy, unspecified, not intractable, without status epilepticus: Secondary | ICD-10-CM

## 2016-06-22 DIAGNOSIS — Z8673 Personal history of transient ischemic attack (TIA), and cerebral infarction without residual deficits: Secondary | ICD-10-CM | POA: Diagnosis not present

## 2016-06-22 DIAGNOSIS — Z87891 Personal history of nicotine dependence: Secondary | ICD-10-CM | POA: Insufficient documentation

## 2016-06-22 DIAGNOSIS — Z79899 Other long term (current) drug therapy: Secondary | ICD-10-CM | POA: Diagnosis not present

## 2016-06-22 DIAGNOSIS — E039 Hypothyroidism, unspecified: Secondary | ICD-10-CM | POA: Diagnosis not present

## 2016-06-22 DIAGNOSIS — R51 Headache: Secondary | ICD-10-CM | POA: Diagnosis not present

## 2016-06-22 DIAGNOSIS — R569 Unspecified convulsions: Secondary | ICD-10-CM | POA: Diagnosis present

## 2016-06-22 DIAGNOSIS — R519 Headache, unspecified: Secondary | ICD-10-CM

## 2016-06-22 LAB — CBC WITH DIFFERENTIAL/PLATELET
BASOS ABS: 0 10*3/uL (ref 0.0–0.1)
Basophils Relative: 1 %
Eosinophils Absolute: 0.2 10*3/uL (ref 0.0–0.7)
Eosinophils Relative: 3 %
HEMATOCRIT: 38.8 % (ref 36.0–46.0)
Hemoglobin: 12.8 g/dL (ref 12.0–15.0)
LYMPHS ABS: 1.9 10*3/uL (ref 0.7–4.0)
LYMPHS PCT: 36 %
MCH: 28.6 pg (ref 26.0–34.0)
MCHC: 33 g/dL (ref 30.0–36.0)
MCV: 86.8 fL (ref 78.0–100.0)
MONO ABS: 0.5 10*3/uL (ref 0.1–1.0)
MONOS PCT: 9 %
NEUTROS ABS: 2.7 10*3/uL (ref 1.7–7.7)
Neutrophils Relative %: 51 %
Platelets: 225 10*3/uL (ref 150–400)
RBC: 4.47 MIL/uL (ref 3.87–5.11)
RDW: 14.7 % (ref 11.5–15.5)
WBC: 5.2 10*3/uL (ref 4.0–10.5)

## 2016-06-22 LAB — COMPREHENSIVE METABOLIC PANEL
ALBUMIN: 3.7 g/dL (ref 3.5–5.0)
ALT: 10 U/L — ABNORMAL LOW (ref 14–54)
ANION GAP: 6 (ref 5–15)
AST: 13 U/L — AB (ref 15–41)
Alkaline Phosphatase: 107 U/L (ref 38–126)
BUN: 11 mg/dL (ref 6–20)
CHLORIDE: 110 mmol/L (ref 101–111)
CO2: 24 mmol/L (ref 22–32)
Calcium: 8.5 mg/dL — ABNORMAL LOW (ref 8.9–10.3)
Creatinine, Ser: 0.9 mg/dL (ref 0.44–1.00)
GFR calc Af Amer: >60 mL/min (ref >60)
GFR calc non Af Amer: >60 mL/min (ref >60)
GLUCOSE: 97 mg/dL (ref 65–99)
POTASSIUM: 4 mmol/L (ref 3.5–5.1)
SODIUM: 140 mmol/L (ref 135–145)
Total Bilirubin: 0.3 mg/dL (ref 0.3–1.2)
Total Protein: 6.5 g/dL (ref 6.5–8.1)

## 2016-06-22 LAB — URINALYSIS, ROUTINE W REFLEX MICROSCOPIC
BILIRUBIN URINE: NEGATIVE
Glucose, UA: NEGATIVE mg/dL
Ketones, ur: NEGATIVE mg/dL
Leukocytes, UA: NEGATIVE
Nitrite: NEGATIVE
PROTEIN: NEGATIVE mg/dL
Specific Gravity, Urine: 1.025 (ref 1.005–1.030)
pH: 6 (ref 5.0–8.0)

## 2016-06-22 LAB — URINE MICROSCOPIC-ADD ON
Bacteria, UA: NONE SEEN
WBC, UA: NONE SEEN WBC/hpf (ref 0–5)

## 2016-06-22 LAB — RAPID URINE DRUG SCREEN, HOSP PERFORMED
Amphetamines: NOT DETECTED
BARBITURATES: NOT DETECTED
BENZODIAZEPINES: NOT DETECTED
Cocaine: NOT DETECTED
Opiates: NOT DETECTED
Tetrahydrocannabinol: POSITIVE — AB

## 2016-06-22 MED ORDER — OXYCODONE-ACETAMINOPHEN 5-325 MG PO TABS
2.0000 | ORAL_TABLET | Freq: Once | ORAL | Status: AC
  Administered 2016-06-22: 2 via ORAL
  Filled 2016-06-22: qty 2

## 2016-06-22 MED ORDER — LEVETIRACETAM IN NACL 1000 MG/100ML IV SOLN
1000.0000 mg | Freq: Once | INTRAVENOUS | Status: AC
  Administered 2016-06-22: 1000 mg via INTRAVENOUS
  Filled 2016-06-22: qty 100

## 2016-06-22 MED ORDER — HYDROCODONE-ACETAMINOPHEN 5-325 MG PO TABS: ORAL_TABLET | ORAL | 0 refills | Status: DC

## 2016-06-22 NOTE — ED Triage Notes (Signed)
Pt states she had a seizure yesterday and has not been feeling right today.  States she has a bad headache.

## 2016-06-22 NOTE — ED Provider Notes (Signed)
[facility] DEPT Provider Note   CSN: [id number] Arrival date & time: 06/22/16  [id number]  First Provider Contact:  None       History   Chief Complaint Chief Complaint  Patient presents with  . Migraine    HPI Sheryl Hall is a 55 y.o. female.  HPI  Pt was seen at [place]. Per pt and her friend, c/o sudden onset and resolution of one episode of seizure that occurred yesterday. Pt's friend states pt "stared off, slid down, then started shaking." This lasted approximately 1 minute before resolving. Pt states she has not had any seizures today. Pt's last seizure was June (2 months ago). Pt endorses compliance with her keppra. Pt states she has had acute flair of her chronic frontal and right sided headache since her seizure yesterday. Describes her headache as per her usual chronic pain pattern.  Denies headache was sudden or maximal in onset or at any time.  Denies visual changes, no focal motor weakness, no tingling/numbness in extremities, no fevers, no neck pain, no rash.     Past Medical History:  Diagnosis Date  . Anxiety   . Arthritis   . 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 Bipolar 1 disorder ([place])   . Chronic left hip pain   . Chronic nausea   . Depression   . Diverticulosis of colon   . Family hx of colon cancer    age 52-father  . Folate deficiency   . GERD (gastroesophageal reflux disease)   . Headache   . Heart murmur   . Hx of abnormal Pap smear   . Hyperlipidemia   . Hyperthyroidism   . Keratosis, actinic   . Low back pain 03/01/2013   MRI with multiple levels of disc bulge  . Polysubstance abuse   . PTSD (post-traumatic stress disorder)   . S/P colonoscopy 05/30/2010   LAX sphincter tone, anal papilla, left-sided diverticulosis, normal random biopsies,, 1 polyp-TA  . S/P endoscopy 05/30/2010   Dr Rourk-> non--critical Schatzki's ring, s/p 66F dilation  . Stroke [facility]) 2014   Right parietal, no deficits   . Tubular adenoma of colon 05/30/2010   Next  colonoscopy 05/2015  . UTI (urinary tract infection)     Patient Active Problem List   Diagnosis Date Noted  . 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  . Avascular necrosis of femur head, right ([place]) 05/23/2015  . Constipation 09/20/2014  . Loose stools 08/02/2014  . Postoperative anemia due to acute blood loss 04/20/2014  . Avascular necrosis of femur head, left ([place]) 04/19/2014  . OA (osteoarthritis) of hip 04/19/2014  . Partial epilepsy with impairment of consciousness ([place]) 04/13/2014  . Pain in limb 12/19/2013  . History of stroke 12/19/2013  . Acute confusional state 03/24/2013  . Low back pain   . PTSD (post-traumatic stress disorder) 03/04/2013  . Bipolar disorder, unspecified ([place]) 03/04/2013  . Anxiety   . Depression   . Stroke ([place])   . Hyperthyroidism   . Hyperlipidemia   . Folate deficiency   . Hx of abnormal Pap smear   . Abdominal bloating 11/05/2011  . IBS (irritable bowel syndrome) 11/05/2011  . Chronic diarrhea 09/18/2011  . Gastroparesis 09/18/2011  . Drug abuse 09/18/2011  . Tubular adenoma of colon 09/18/2011  . Family hx of colon cancer 09/18/2011  . COLONIC POLYPS, ADENOMATOUS 07/08/2010  . DIZZINESS 07/08/2010  . GERD 05/16/2010  . WEIGHT LOSS 05/16/2010  . NAUSEA WITH VOMITING 05/16/2010  .  Dysphagia 05/16/2010  . DIARRHEA 05/16/2010  . ABDOMINAL PAIN, UNSPECIFIED SITE 05/16/2010    Past Surgical History:  Procedure Laterality Date  . COLONOSCOPY     every 5 years  . COLONOSCOPY  05/30/2010   RMR:lax anal sphincter tone,anal papilla,otherwise normal/left-sided diverticula  . ESOPHAGOGASTRODUODENOSCOPY  05/30/2010   [id number] ring/small HH otherwise normal  . EXTERNAL EAR SURGERY Left 12 years ago   skin graft from behind ear put in ear canal  . JOINT REPLACEMENT    . NECK SURGERY  10 years ago  . SKIN CANCER DESTRUCTION    . TOTAL HIP ARTHROPLASTY Left 04/19/2014   Procedure: LEFT TOTAL HIP ARTHROPLASTY ANTERIOR APPROACH;  Surgeon:  [name], MD;  Location: [facility] ORS;  Service: Orthopedics;  Laterality: Left;  . TOTAL HIP ARTHROPLASTY Right 05/23/2015   Procedure: RIGHT TOTAL HIP ARTHROPLASTY ANTERIOR APPROACH;  Surgeon: Gaynelle Arabian, MD;  Location: [facility] ORS;  Service: Orthopedics;  Laterality: Right;    OB History    Gravida Para Term Preterm AB Living   2 1 1   1 1    SAB TAB Ectopic Multiple Live Births   1               Home Medications    Prior to Admission medications   Medication Sig Start Date End Date Taking? Authorizing Provider  aspirin EC 81 MG tablet Take 81 mg by mouth daily.    Historical Provider, MD  clonazePAM (KLONOPIN) 1 MG tablet 1 tab po BID prn anxiety Patient taking differently: Take 0.5 mg by mouth 3 (three) times daily as needed.  02/23/15   Alycia Rossetti, MD  esomeprazole (NEXIUM) 40 MG capsule Take 1 capsule (40 mg total) by mouth daily. 03/27/16   Susy Frizzle, MD  fluticasone (FLONASE) 50 MCG/ACT nasal spray Place 2 sprays into both nostrils daily.    Historical Provider, MD  levETIRAcetam (KEPPRA) 250 MG tablet TAKE 1 TABLET BY MOUTH IN THE MORNING AND TAKE 2 TABLETS AT BEDTIME. 12/31/15   Ward Givens, NP  levothyroxine (SYNTHROID, LEVOTHROID) 50 MCG tablet TAKE ONE TABLET BY MOUTH ONCE DAILY. 03/24/16   Susy Frizzle, MD  linaclotide [facility]) 72 MCG capsule Take 1 capsule (72 mcg total) by mouth daily before breakfast. 06/09/16   Carlis Stable, NP  Oxcarbazepine (TRILEPTAL) 300 MG tablet Take 300 mg by mouth 2 (two) times daily.    Historical Provider, MD  polyethylene glycol-electrolytes (TRILYTE) 420 g solution Take 4,000 mLs by mouth as directed. 05/28/16   Daneil Dolin, MD  traZODone (DESYREL) 100 MG tablet Take 1 tablet (100 mg total) by mouth at bedtime. Patient taking differently: Take 50 mg by mouth at bedtime.  02/23/15   Alycia Rossetti, MD  valACYclovir (VALTREX) 500 MG tablet Take 1 tablet (500 mg total) by mouth daily. 04/08/16   Alycia Rossetti, MD    Family  History Family History  Problem Relation Age of Onset  . Diabetes Mother   . Hypertension Mother   . Colon cancer Father 20    living   . Hypertension Father   . Hypertension Sister   . Heart attack Brother     Social History Social History  Substance Use Topics  . Smoking status: Former Smoker    Packs/day: 0.25    Years: 30.00    Types: Cigarettes  . Smokeless tobacco: Never Used     Comment: Quit June 2016  . Alcohol use No  Comment: social     Allergies   Review of patient's allergies indicates no known allergies.   Review of Systems Review of Systems ROS: Statement: All systems negative except as marked or noted in the HPI; Constitutional: Negative for fever and chills. ; ; Eyes: Negative for eye pain, redness and discharge. ; ; ENMT: Negative for ear pain, hoarseness, nasal congestion, sinus pressure and sore throat. ; ; Cardiovascular: Negative for chest pain, palpitations, diaphoresis, dyspnea and peripheral edema. ; ; Respiratory: Negative for cough, wheezing and stridor. ; ; Gastrointestinal: Negative for nausea, vomiting, diarrhea, abdominal pain, blood in stool, hematemesis, jaundice and rectal bleeding. . ; ; Genitourinary: Negative for dysuria, flank pain and hematuria. ; ; Musculoskeletal: Negative for back pain and neck pain. Negative for swelling and trauma.; ; Skin: Negative for pruritus, rash, abrasions, blisters, bruising and skin lesion.; ; Neuro: +headache, seizure. Negative for lightheadedness and neck stiffness. Negative for weakness, extremity weakness, paresthesias, and syncope.      Physical Exam Updated Vital Signs BP 126/75   Pulse 65   Temp 98.3 F (36.8 C) (Oral)   Resp 18   Ht 5\' 6"  (1.676 m)   Wt 170 lb (77.1 kg)   SpO2 100%   BMI 27.44 kg/m   Physical Exam 1950: Physical examination:  Nursing notes reviewed; Vital signs and O2 SAT reviewed;  Constitutional: Well developed, Well nourished, Well hydrated, In no acute distress;  Head:  Normocephalic, atraumatic; Eyes: EOMI, PERRL, No scleral icterus; ENMT: TM's clear bilat. +edemetous nasal turbinates bilat with clear rhinorrhea. Mouth and pharynx normal, Mucous membranes moist; Neck: Supple, Full range of motion, No lymphadenopathy; Cardiovascular: Regular rate and rhythm, No gallop; Respiratory: Breath sounds clear & equal bilaterally, No wheezes.  Speaking full sentences with ease, Normal respiratory effort/excursion; Chest: Nontender, Movement normal; Abdomen: Soft, Nontender, Nondistended, Normal bowel sounds; Genitourinary: No CVA tenderness; Extremities: Pulses normal, No tenderness, No edema, No calf edema or asymmetry.; Neuro: AA&Ox3, Major CN grossly intact. No facial droop. Speech clear. No gross focal motor or sensory deficits in extremities.; Skin: Color normal, Warm, Dry.   ED Treatments / Results  Labs (all labs ordered are listed, but only abnormal results are displayed)   EKG  EKG Interpretation None       Radiology   Procedures Procedures (including critical care time)  Medications Ordered in ED Medications  oxyCODONE-acetaminophen (PERCOCET/ROXICET) 5-325 MG per tablet 2 tablet (2 tablets Oral Given 06/22/16 2009)     Initial Impression / Assessment and Plan / ED Course  I have reviewed the triage vital signs and the nursing notes.  Pertinent labs & imaging results that were available during my care of the patient were reviewed by me and considered in my medical decision making (see chart for details).  MDM Reviewed: previous chart, nursing note and vitals Reviewed previous: labs Interpretation: CT scan and labs    Results for orders placed or performed during the hospital encounter of 06/22/16  Comprehensive metabolic panel  Result Value Ref Range   Sodium 140 135 - 145 mmol/L   Potassium 4.0 3.5 - 5.1 mmol/L   Chloride 110 101 - 111 mmol/L   CO2 24 22 - 32 mmol/L   Glucose, Bld 97 65 - 99 mg/dL   BUN 11 6 - 20 mg/dL    Creatinine, Ser 0.90 0.44 - 1.00 mg/dL   Calcium 8.5 (L) 8.9 - 10.3 mg/dL   Total Protein 6.5 6.5 - 8.1 g/dL   Albumin 3.7 3.5 -  5.0 g/dL   AST 13 (L) 15 - 41 U/L   ALT 10 (L) 14 - 54 U/L   Alkaline Phosphatase 107 38 - 126 U/L   Total Bilirubin 0.3 0.3 - 1.2 mg/dL   GFR calc non Af Amer >60 >60 mL/min   GFR calc Af Amer >60 >60 mL/min   Anion gap 6 5 - 15  CBC WITH DIFFERENTIAL  Result Value Ref Range   WBC 5.2 4.0 - 10.5 K/uL   RBC 4.47 3.87 - 5.11 MIL/uL   Hemoglobin 12.8 12.0 - 15.0 g/dL   HCT 38.8 36.0 - 46.0 %   MCV 86.8 78.0 - 100.0 fL   MCH 28.6 26.0 - 34.0 pg   MCHC 33.0 30.0 - 36.0 g/dL   RDW 14.7 11.5 - 15.5 %   Platelets 225 150 - 400 K/uL   Neutrophils Relative % 51 %   Neutro Abs 2.7 1.7 - 7.7 K/uL   Lymphocytes Relative 36 %   Lymphs Abs 1.9 0.7 - 4.0 K/uL   Monocytes Relative 9 %   Monocytes Absolute 0.5 0.1 - 1.0 K/uL   Eosinophils Relative 3 %   Eosinophils Absolute 0.2 0.0 - 0.7 K/uL   Basophils Relative 1 %   Basophils Absolute 0.0 0.0 - 0.1 K/uL  Urine rapid drug screen (hosp performed)not at [facility]  Result Value Ref Range   Opiates NONE DETECTED NONE DETECTED   Cocaine NONE DETECTED NONE DETECTED   Benzodiazepines NONE DETECTED NONE DETECTED   Amphetamines NONE DETECTED NONE DETECTED   Tetrahydrocannabinol POSITIVE (A) NONE DETECTED   Barbiturates NONE DETECTED NONE DETECTED  Urinalysis, Routine w reflex microscopic (not at [facility])  Result Value Ref Range   Color, Urine YELLOW YELLOW   APPearance CLEAR CLEAR   Specific Gravity, Urine 1.025 1.005 - 1.030   pH 6.0 5.0 - 8.0   Glucose, UA NEGATIVE NEGATIVE mg/dL   Hgb urine dipstick TRACE (A) NEGATIVE   Bilirubin Urine NEGATIVE NEGATIVE   Ketones, ur NEGATIVE NEGATIVE mg/dL   Protein, ur NEGATIVE NEGATIVE mg/dL   Nitrite NEGATIVE NEGATIVE   Leukocytes, UA NEGATIVE NEGATIVE  Urine microscopic-add on  Result Value Ref Range   Squamous Epithelial / LPF 6-30 (A) NONE SEEN   WBC, UA NONE SEEN 0 - 5  WBC/hpf   RBC / HPF 0-5 0 - 5 RBC/hpf   Bacteria, UA NONE SEEN NONE SEEN   Ct Head Wo Contrast Result Date: 06/22/2016 CLINICAL DATA:  Headaches EXAM: CT HEAD WITHOUT CONTRAST TECHNIQUE: Contiguous axial images were obtained from the base of the skull through the vertex without intravenous contrast. COMPARISON:  02/25/2014 FINDINGS: Bony calvarium is intact. Changes of prior infarct at the junction of the right temporal and occipital lobes stable from the prior exam. No findings to suggest acute hemorrhage, acute infarction or space-occupying mass lesion are identified. IMPRESSION: Changes of prior infarct as described. No acute intracranial abnormality noted. Electronically Signed   By: Inez Catalina M.D.   On: 06/22/2016 20:26   2050:  EPIC chart reviewed: on 06/16/16, pt's PMD stopped pt's lamictal and changed trileptal to 300mg  qam and 450mg  qhs (increased), pt to continue keppra 250mg  qam and 500mg  qpm. T/C to [facility] Neuro Dr. Nicole Kindred, case discussed, including:  HPI, pertinent PM/SHx, VS/PE, dx testing, ED course and treatment:  keppra dose is very low, requests to dose IV keppra 1000mg  now, increase keppra to 500mg  qam and 750mg  qpm, f/u Neuro MD. Pt states she "feels better now" and  wants to go home. Has tol PO well while in the ED without N/V. Dx and testing, as well as d/w Neuro MD, d/w pt and family.  Questions answered.  Verb understanding, agreeable to d/c home with outpt f/u.      Final Clinical Impressions(s) / ED Diagnoses   Final diagnoses:  None    New Prescriptions New Prescriptions   No medications on file     Francine Graven, DO 06/25/16 1254

## 2016-06-22 NOTE — ED Notes (Signed)
MD at bedside. 

## 2016-06-22 NOTE — ED Notes (Signed)
Pt reports "migraine" HA, denies N/V/, light or sound sensitivity. States she had a seizure last night. Friend reports pt "stared off, slid down and then started shaking", lasted approx a minute per friend. Denies any seizures today.

## 2016-06-22 NOTE — Discharge Instructions (Signed)
Take the prescription as directed.  Increase your keppra to:  500mg  by mouth in the morning and 750mg  by mouth in the evening. Call your regular medical doctor and your Neurologist tomorrow to schedule a follow up appointment within the next 3 days.  Return to the Emergency Department immediately sooner if worsening.

## 2016-06-22 NOTE — ED Notes (Signed)
Pt to CT at this time.

## 2016-06-23 ENCOUNTER — Telehealth: Payer: Self-pay

## 2016-06-23 NOTE — Telephone Encounter (Signed)
Called pt and appt scheduled w/ megan NP on August 22nd. Pt states that she was seen in ER yesterday and Keppra dose was increased to 1000 mg/day per pt report. Says that she was advised not to drive and would not be able to come in for sooner appt d/t transportation. Agreed to call back w/ additional questions/concerns.

## 2016-06-23 NOTE — Telephone Encounter (Signed)
-----   Message from Kathrynn Ducking, MD sent at 06/23/2016  7:28 AM EDT ----- This patient will need a RV sometime in the next 1 to 2 weeks, OK to see NP, has seen Megan.

## 2016-06-25 ENCOUNTER — Ambulatory Visit (HOSPITAL_COMMUNITY): Payer: Self-pay

## 2016-07-02 ENCOUNTER — Ambulatory Visit (HOSPITAL_COMMUNITY)
Admission: RE | Admit: 2016-07-02 | Discharge: 2016-07-02 | Disposition: A | Discharge status: Home / Self Care | Payer: Medicaid Other | Source: Ambulatory Visit | Attending: Family Medicine | Admitting: Family Medicine

## 2016-07-02 DIAGNOSIS — Z1231 Encounter for screening mammogram for malignant neoplasm of breast: Secondary | ICD-10-CM | POA: Insufficient documentation

## 2016-07-15 ENCOUNTER — Encounter: Payer: Self-pay | Admitting: Adult Health

## 2016-07-15 ENCOUNTER — Ambulatory Visit (INDEPENDENT_AMBULATORY_CARE_PROVIDER_SITE_OTHER): Payer: Medicaid Other | Admitting: Adult Health

## 2016-07-15 VITALS — BP 109/78 | HR 83 | Ht 66.0 in | Wt 173.8 lb

## 2016-07-15 DIAGNOSIS — R569 Unspecified convulsions: Secondary | ICD-10-CM

## 2016-07-15 MED ORDER — LEVETIRACETAM 750 MG PO TABS: 750.0000 mg | ORAL_TABLET | Freq: Two times a day (BID) | ORAL | 5 refills | Status: DC

## 2016-07-15 NOTE — Patient Instructions (Addendum)
Increase Keppra 750 mg twice a day No driving until seizure-free for 6 months If he has any additional seizure events please let us know.

## 2016-07-15 NOTE — Progress Notes (Signed)
PATIENT: Sheryl Hall DOB: [birth year]  REASON FOR VISIT: follow up- seizures HISTORY FROM: patient  HISTORY OF PRESENT ILLNESS:  Today July 15 2016 Sheryl Hall is a 55 year old female with a history of seizures. She returns today for follow-up. The patient recently suffered a seizure on 06/22/2016. According to the ED notes the patient friend states pt "stared off, slid down, then started shaking." This lasted approximately 1 minute before resolving. In the ED the patient's Keppra was increased to 500 mg in the morning and 750 mg in the evening. The patient states that since then she's not had any additional seizure events but she has had lightheadedness that usually comes for the seizure. She does see psychiatry at [facility]. She was placed on Trileptal unsure how long she has been on this medication. She was using it for her bipolar disorder but does not feel that is beneficial. She is not operating a motor vehicle at this time. She is able to complete all ADLs independently. Denies any changes in her gait or balance. Denies any changes in her mood or behavior. She returns today for an evaluation.  HISTORY 07/05/15: Sheryl Hall is a 55 year old female with a history of seizures. She returns today for follow-up. The patient is currently taking Keppra 250 mg in the morning and 500 mg in the evening. She is tolerating this medication well. She denies any episodes of confusion or blackout events. She states that overall she is doing well. She recently had a right hip replacement. She is able to complete all ADLs independently. She  operates a motor vehicle however she has refrained from driving after having the hip replacement. The patient is followed at [facility] psychiatry. She is still complaining of insomnia. She returns today for an evaluation.  HISTORY 04/13/14 (WILLIS): Sheryl Hall is a 55 year old right-handed white female with a history of cerebrovascular disease. The patient had a right parietal  cortical infarct in March of 2014, and she had a blackout associated with this. EEG study at that time was abnormal, and she has been placed on Keppra. The patient went to the emergency room on 02/25/2014 with an episode of confusion coming out of sleep. The episode lasted over one hour, and resolved after she got to the emergency room. The patient was increased on the [facility] taking 1000 mg daily, but she could not tolerate the dose secondary to increased drowsiness. The patient has chronic insomnia at night, and she feels fatigued during the day. The patient has trazodone to take at night with 100 mg tablets. This is not effective for sleep, but if she takes 150 mg at night, she does sleep better. The patient is currently on Keppra taking 250 mg in the morning and 500 mg in the evening. The patient returns to this office for an evaluation.  REVIEW OF SYSTEMS: Out of a complete 14 system review of symptoms, the patient complains only of the following symptoms, and all other reviewed systems are negative.  See history of present illness    ALLERGIES: No Known Allergies  HOME MEDICATIONS: Outpatient Medications Prior to Visit  Medication Sig Dispense Refill  . aspirin EC 81 MG tablet Take 81 mg by mouth daily.    . clonazePAM (KLONOPIN) 1 MG tablet 1 tab po BID prn anxiety (Patient taking differently: Take 0.5 mg by mouth 3 (three) times daily as needed. ) 60 tablet 0  . esomeprazole (NEXIUM) 40 MG capsule Take 1 capsule (40 mg total) by mouth daily.  30 capsule 11  . fluticasone (FLONASE) 50 MCG/ACT nasal spray Place 2 sprays into both nostrils daily.    Marland Kitchen HYDROcodone-acetaminophen (NORCO/VICODIN) 5-325 MG tablet 1 or 2 tabs PO q6 hours prn pain 10 tablet 0  . levETIRAcetam (KEPPRA) 250 MG tablet TAKE 1 TABLET BY MOUTH IN THE MORNING AND TAKE 2 TABLETS AT BEDTIME. 90 tablet 6  . levothyroxine (SYNTHROID, LEVOTHROID) 50 MCG tablet TAKE ONE TABLET BY MOUTH ONCE DAILY. 30 tablet 3  . linaclotide  (LINZESS) 72 MCG capsule Take 1 capsule (72 mcg total) by mouth daily before breakfast. 90 capsule 2  . Oxcarbazepine (TRILEPTAL) 300 MG tablet Take 300 mg by mouth 2 (two) times daily.    . polyethylene glycol-electrolytes (TRILYTE) 420 g solution Take 4,000 mLs by mouth as directed. 4000 mL 0  . traZODone (DESYREL) 100 MG tablet Take 1 tablet (100 mg total) by mouth at bedtime. (Patient taking differently: Take 50 mg by mouth at bedtime. ) 30 tablet 0  . valACYclovir (VALTREX) 500 MG tablet Take 1 tablet (500 mg total) by mouth daily. 30 tablet 6   No facility-administered medications prior to visit.     PAST MEDICAL HISTORY: Past Medical History:  Diagnosis Date  . Anxiety   . Arthritis   . 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 Bipolar 1 disorder ([place])   . Chronic left hip pain   . Chronic nausea   . Depression   . Diverticulosis of colon   . Family hx of colon cancer    age 15-father  . Folate deficiency   . GERD (gastroesophageal reflux disease)   . Headache   . Heart murmur   . Hx of abnormal Pap smear   . Hyperlipidemia   . Hyperthyroidism   . Keratosis, actinic   . Low back pain 03/01/2013   MRI with multiple levels of disc bulge  . Polysubstance abuse   . PTSD (post-traumatic stress disorder)   . S/P colonoscopy 05/30/2010   LAX sphincter tone, anal papilla, left-sided diverticulosis, normal random biopsies,, 1 polyp-TA  . S/P endoscopy 05/30/2010   Dr Rourk-> non--critical Schatzki's ring, s/p 75F dilation  . Stroke [facility]) 2014   Right parietal, no deficits   . Tubular adenoma of colon 05/30/2010   Next colonoscopy 05/2015  . UTI (urinary tract infection)     PAST SURGICAL HISTORY: Past Surgical History:  Procedure Laterality Date  . COLONOSCOPY     every 5 years  . COLONOSCOPY  05/30/2010   RMR:lax anal sphincter tone,anal papilla,otherwise normal/left-sided diverticula  . ESOPHAGOGASTRODUODENOSCOPY  05/30/2010   [id number] ring/small HH otherwise  normal  . EXTERNAL EAR SURGERY Left 12 years ago   skin graft from behind ear put in ear canal  . JOINT REPLACEMENT    . NECK SURGERY  10 years ago  . SKIN CANCER DESTRUCTION    . 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 TOTAL HIP ARTHROPLASTY Right 05/23/2015   Procedure: RIGHT TOTAL HIP ARTHROPLASTY ANTERIOR APPROACH;  Surgeon: Gaynelle Arabian, MD;  Location: [facility]S;  Service: Orthopedics;  Laterality: Right;    FAMILY HISTORY: Family History  Problem Relation Age of Onset  . Diabetes Mother   . Hypertension Mother   . Colon cancer Father 6    living   . Hypertension Father   . Hypertension  Sister   . Heart attack Brother     SOCIAL HISTORY: Social History   Social History  . Marital status: Divorced    Spouse name: N/A  . Number of children: 1  . Years of education: N/A   Occupational History  . disabled    Social History Main Topics  . Smoking status: Former Smoker    Packs/day: 0.25    Years: 30.00    Types: Cigarettes  . Smokeless tobacco: Never Used     Comment: Quit June 2016  . Alcohol use No     Comment: social  . Drug use: No     Comment: hx cocaine abuse  . Sexual activity: No   Other Topics Concern  . Not on file   Social History Narrative   Patient lives at home alone and she is single.    Disabled.   Education college education.   Right handed.   Caffeine mountain dew four daily.       PHYSICAL EXAM  Vitals:   07/15/16 1441  BP: 109/78  Pulse: 83  Weight: 173 lb 12.8 oz (78.8 kg)  Height: 5\' 6"  (1.676 m)   Body mass index is 28.05 kg/m.  Generalized: Well developed, in no acute distress   Neurological examination  Mentation: Alert oriented to time, place, history taking. Follows all commands speech and language fluent Cranial nerve II-XII: Pupils were equal round reactive to  light. Extraocular movements were full, visual field were full on confrontational test. Facial sensation and strength were normal. Uvula tongue midline. Head turning and shoulder shrug  were normal and symmetric. Motor: The motor testing reveals 5 over 5 strength of all 4 extremities. Good symmetric motor tone is noted throughout.  Sensory: Sensory testing is intact to soft touch on all 4 extremities. No evidence of extinction is noted.  Coordination: Cerebellar testing reveals good finger-nose-finger and heel-to-shin bilaterally.  Gait and station: Gait is normal. Tandem gait is normal. Romberg is negative. No drift is seen.  Reflexes: Deep tendon reflexes are symmetric and normal bilaterally.   DIAGNOSTIC DATA (LABS, IMAGING, TESTING) - I reviewed patient records, labs, notes, testing and imaging myself where available.  Lab Results  Component Value Date   WBC 5.2 06/22/2016   HGB 12.8 06/22/2016   HCT 38.8 06/22/2016   MCV 86.8 06/22/2016   PLT 225 06/22/2016      Component Value Date/Time   NA 140 06/22/2016 1816   NA 144 10/27/2014 0625   K 4.0 06/22/2016 1816   K 4.2 10/27/2014 0625   K 4.4 08/27/2011 1314   CL 110 06/22/2016 1816   CL 107 10/27/2014 0625   CL 102 08/27/2011 1314   CO2 24 06/22/2016 1816   CO2 29 10/27/2014 0625   CO2 25 08/27/2011 1314   GLUCOSE 97 06/22/2016 1816   GLUCOSE 94 10/27/2014 0625   BUN 11 06/22/2016 1816   BUN 10 10/27/2014 0625   CREATININE 0.90 06/22/2016 1816   CREATININE 1.12 (H) 02/19/2016 1543   CALCIUM 8.5 (L) 06/22/2016 1816   CALCIUM 8.9 10/27/2014 0625   CALCIUM 9.9 08/27/2011 1314   PROT 6.5 06/22/2016 1816   PROT 5.9 (L) 10/27/2014 0625   ALBUMIN 3.7 06/22/2016 1816   ALBUMIN 3.3 (L) 10/27/2014 0625   AST 13 (L) 06/22/2016 1816   AST 16 10/27/2014 0625   AST 11 08/27/2011 1314   ALT 10 (L) 06/22/2016 1816   ALT 14 10/27/2014 0625   ALKPHOS 107 06/22/2016 1816  ALKPHOS 119 (H) 10/27/2014 0625   ALKPHOS 91 08/27/2011  1314   BILITOT 0.3 06/22/2016 1816   BILITOT 0.4 10/27/2014 0625   BILITOT 0.5 08/27/2011 1314   GFRNONAA >60 06/22/2016 1816   GFRNONAA 56 (L) 02/19/2016 1543   GFRAA >60 06/22/2016 1816   GFRAA 65 02/19/2016 1543        ASSESSMENT AND PLAN 56 y.o. year old female  has a past medical history of Anxiety; Arthritis; Avascular necrosis of hip ([place]); Bipolar 1 disorder ([place]); Chronic left hip pain; Chronic nausea; Depression; Diverticulosis of colon; Family hx of colon cancer; Folate deficiency; GERD (gastroesophageal reflux disease); Headache; Heart murmur; abnormal Pap smear; Hyperlipidemia; Hyperthyroidism; Keratosis, actinic; Low back pain (03/01/2013); Polysubstance abuse; PTSD (post-traumatic stress disorder); S/P colonoscopy (05/30/2010); S/P endoscopy (05/30/2010); Stroke [facility]) (2014); Tubular adenoma of colon (05/30/2010); and UTI (urinary tract infection). here with:  1. Seizures  Overall the patient is doing well. She is not had any additional seizures since increasing Keppra. She will increase Keppra to 750 mg BID. Patient was advised that if she has any additional seizure event she should let us know. She should not operate a motor vehicle until seizure-free for 6 months. Will follow-up in 3 months or sooner if needed.     Ward Givens, MSN, NP-C 07/15/2016, 2:36 PM [facility] [address], [place], [place] [address] [phone]

## 2016-07-15 NOTE — Progress Notes (Signed)
I have read the note, and I agree with the clinical assessment and plan.  WILLIS,CHARLES KEITH   

## 2016-07-22 ENCOUNTER — Other Ambulatory Visit: Payer: Self-pay | Admitting: Family Medicine

## 2016-08-18 ENCOUNTER — Emergency Department (HOSPITAL_COMMUNITY)
Admission: EM | Admit: 2016-08-18 | Discharge: 2016-08-18 | Disposition: A | Discharge status: Home / Self Care | Payer: Medicaid Other | Attending: Emergency Medicine | Admitting: Emergency Medicine

## 2016-08-18 ENCOUNTER — Encounter (HOSPITAL_COMMUNITY): Payer: Self-pay | Admitting: *Deleted

## 2016-08-18 DIAGNOSIS — R51 Headache: Secondary | ICD-10-CM | POA: Diagnosis not present

## 2016-08-18 DIAGNOSIS — Z8673 Personal history of transient ischemic attack (TIA), and cerebral infarction without residual deficits: Secondary | ICD-10-CM | POA: Diagnosis not present

## 2016-08-18 DIAGNOSIS — F419 Anxiety disorder, unspecified: Secondary | ICD-10-CM | POA: Diagnosis not present

## 2016-08-18 DIAGNOSIS — R519 Headache, unspecified: Secondary | ICD-10-CM

## 2016-08-18 DIAGNOSIS — Z79899 Other long term (current) drug therapy: Secondary | ICD-10-CM | POA: Insufficient documentation

## 2016-08-18 DIAGNOSIS — F17210 Nicotine dependence, cigarettes, uncomplicated: Secondary | ICD-10-CM | POA: Diagnosis not present

## 2016-08-18 DIAGNOSIS — Z7982 Long term (current) use of aspirin: Secondary | ICD-10-CM | POA: Diagnosis not present

## 2016-08-18 HISTORY — DX: Panic disorder (episodic paroxysmal anxiety): F41.0

## 2016-08-18 MED ORDER — KETOROLAC TROMETHAMINE 60 MG/2ML IM SOLN
60.0000 mg | Freq: Once | INTRAMUSCULAR | Status: AC
  Administered 2016-08-18: 60 mg via INTRAMUSCULAR
  Filled 2016-08-18: qty 2

## 2016-08-18 MED ORDER — DIPHENHYDRAMINE HCL 25 MG PO CAPS
25.0000 mg | ORAL_CAPSULE | Freq: Once | ORAL | Status: AC
  Administered 2016-08-18: 25 mg via ORAL
  Filled 2016-08-18: qty 1

## 2016-08-18 MED ORDER — LORAZEPAM 2 MG/ML IJ SOLN
1.0000 mg | Freq: Once | INTRAMUSCULAR | Status: AC
  Administered 2016-08-18: 1 mg via INTRAMUSCULAR
  Filled 2016-08-18: qty 1

## 2016-08-18 NOTE — ED Triage Notes (Signed)
Pt brought in by EMS for headache starting when she woke up. Upon EMS arrival, pt had bilateral hand tingling. However, she was breathing rapidly at the time. Pt does not have these symptoms at present. NAD noted.

## 2016-08-18 NOTE — Discharge Instructions (Signed)
Be sure to take your Keppra when you get home.  Follow-up with your primary doctor or your neurologist for recheck. Return to the ER for any worsening symptoms.

## 2016-08-18 NOTE — ED Provider Notes (Signed)
[facility] DEPT Provider Note   CSN: [id number] Arrival date & time: 08/18/16  [id number]     History   Chief Complaint Chief Complaint  Patient presents with  . Headache    HPI MIGNA HOUDEK is a 55 y.o. female.  HPI   CHANTIA GRATES is a 55 y.o. female with history of seizures who presents to the Emergency Department complaining of frontal headache that began this morning. She describes the headache as a throbbing sensation to her for head that radiates to the top of her head. Headache is associated with photosensitivity. She also reports that she has recently been stressed due to the death of her cat and a family member. She does state that she had a seizure Saturday but has been taking Keppra regularly.  She states that she had some tingling to her bilateral hands and felt anxious this morning, though symptoms improved prior to ER arrival. She states that she has not taken any of her medications this morning. She denies neck pain or stiffness, fever, chills, visual changes, numbness or weakness, chest pain or shortness of breath.   Past Medical History:  Diagnosis Date  . Anxiety   . Arthritis   . Avascular necrosis of hip ([place])    left  . Bipolar 1 disorder ([place])   . Chronic left hip pain   . Chronic nausea   . Depression   . Diverticulosis of colon   . Family hx of colon cancer    age 55-father  . Folate deficiency   . GERD (gastroesophageal reflux disease)   . Headache   . Heart murmur   . Hx of abnormal Pap smear   . Hyperlipidemia   . Hyperthyroidism   . Keratosis, actinic   . Low back pain 03/01/2013   MRI with multiple levels of disc bulge  . Panic attacks   . Polysubstance abuse   . PTSD (post-traumatic stress disorder)   . S/P colonoscopy 05/30/2010   LAX sphincter tone, anal papilla, left-sided diverticulosis, normal random biopsies,, 1 polyp-TA  . S/P endoscopy 05/30/2010   Dr Rourk-> non--critical Schatzki's ring, s/p 17F dilation  . Seizures ([place])     . Stroke [facility]) 2014   Right parietal, no deficits   . Tubular adenoma of colon 05/30/2010   Next colonoscopy 05/2015  . UTI (urinary tract infection)     Patient Active Problem List   Diagnosis Date Noted  . 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  . Avascular necrosis of femur head, right ([place]) 05/23/2015  . Constipation 09/20/2014  . Loose stools 08/02/2014  . Postoperative anemia due to acute blood loss 04/20/2014  . Avascular necrosis of femur head, left ([place]) 04/19/2014  . OA (osteoarthritis) of hip 04/19/2014  . Partial epilepsy with impairment of consciousness ([place]) 04/13/2014  . Pain in limb 12/19/2013  . History of stroke 12/19/2013  . Acute confusional state 03/24/2013  . Low back pain   . PTSD (post-traumatic stress disorder) 03/04/2013  . Bipolar disorder, unspecified ([place]) 03/04/2013  . Anxiety   . Depression   . Stroke ([place])   . Hyperthyroidism   . Hyperlipidemia   . Folate deficiency   . Hx of abnormal Pap smear   . Abdominal bloating 11/05/2011  . IBS (irritable bowel syndrome) 11/05/2011  . Chronic diarrhea 09/18/2011  . Gastroparesis 09/18/2011  . Drug abuse 09/18/2011  . Tubular adenoma of colon 09/18/2011  . Family hx of colon cancer 09/18/2011  . COLONIC POLYPS, ADENOMATOUS 07/08/2010  .  DIZZINESS 07/08/2010  . GERD 05/16/2010  . WEIGHT LOSS 05/16/2010  . NAUSEA WITH VOMITING 05/16/2010  . Dysphagia 05/16/2010  . DIARRHEA 05/16/2010  . ABDOMINAL PAIN, UNSPECIFIED SITE 05/16/2010    Past Surgical History:  Procedure Laterality Date  . COLONOSCOPY     every 5 years  . COLONOSCOPY  05/30/2010   RMR:lax anal sphincter tone,anal papilla,otherwise normal/left-sided diverticula  . ESOPHAGOGASTRODUODENOSCOPY  05/30/2010   [id number] ring/small HH otherwise normal  . EXTERNAL EAR SURGERY Left 12 years ago   skin graft from behind ear put in ear canal  . JOINT REPLACEMENT    . NECK SURGERY  10 years ago  . SKIN CANCER DESTRUCTION    . TOTAL HIP  ARTHROPLASTY Left 04/19/2014   Procedure: LEFT TOTAL HIP ARTHROPLASTY ANTERIOR APPROACH;  Surgeon: [name], MD;  Location: [facility];  Service: Orthopedics;  Laterality: Left;  . TOTAL HIP ARTHROPLASTY Right 05/23/2015   Procedure: RIGHT TOTAL HIP ARTHROPLASTY ANTERIOR APPROACH;  Surgeon: Gaynelle Arabian, MD;  Location: [facility];  Service: Orthopedics;  Laterality: Right;    OB History    Gravida Para Term Preterm AB Living   2 1 1   1 1    SAB TAB Ectopic Multiple Live Births   1               Home Medications    Prior to Admission medications   Medication Sig Start Date End Date Taking? Authorizing Provider  aspirin EC 81 MG tablet Take 81 mg by mouth daily.   Yes Historical Provider, MD  busPIRone (BUSPAR) 5 MG tablet Take 5 mg by mouth at bedtime.   Yes Historical Provider, MD  clonazePAM (KLONOPIN) 1 MG tablet 1 tab po BID prn anxiety Patient taking differently: Take 0.5 mg by mouth 3 (three) times daily as needed.  02/23/15  Yes Alycia Rossetti, MD  esomeprazole (NEXIUM) 40 MG capsule Take 1 capsule (40 mg total) by mouth daily. 03/27/16  Yes Susy Frizzle, MD  fluticasone (FLONASE) 50 MCG/ACT nasal spray Place 2 sprays into both nostrils daily.   Yes Historical Provider, MD  folic acid (FOLVITE) [id number] MCG tablet Take 400 mcg by mouth daily.   Yes Historical Provider, MD  levETIRAcetam (KEPPRA) 750 MG tablet Take 1 tablet (750 mg total) by mouth 2 (two) times daily. . 07/15/16  Yes Ward Givens, NP  levothyroxine (SYNTHROID, LEVOTHROID) 50 MCG tablet TAKE ONE TABLET BY MOUTH ONCE DAILY. 07/22/16  Yes Susy Frizzle, MD  linaclotide [facility]) 72 MCG capsule Take 1 capsule (72 mcg total) by mouth daily before breakfast. 06/09/16  Yes Carlis Stable, NP  Oxcarbazepine (TRILEPTAL) 300 MG tablet Take 300 mg by mouth 2 (two) times daily.   Yes Historical Provider, MD  traZODone (DESYREL) 100 MG tablet Take 1 tablet (100 mg total) by mouth at bedtime. 02/23/15  Yes Alycia Rossetti, MD    valACYclovir (VALTREX) 500 MG tablet Take 1 tablet (500 mg total) by mouth daily. 04/08/16  Yes Alycia Rossetti, MD  polyethylene glycol-electrolytes (TRILYTE) 420 g solution Take 4,000 mLs by mouth as directed. Patient not taking: Reported on 07/15/2016 05/28/16   Daneil Dolin, MD    Family History Family History  Problem Relation Age of Onset  . Diabetes Mother   . Hypertension Mother   . Colon cancer Father 69    living   . Hypertension Father   . Hypertension Sister   . Heart attack Brother  Social History Social History  Substance Use Topics  . Smoking status: Current Some Day Smoker    Packs/day: 0.25    Years: 30.00    Types: Cigarettes  . Smokeless tobacco: Never Used     Comment: Quit June 2016  . Alcohol use No     Allergies   Review of patient's allergies indicates no known allergies.   Review of Systems Review of Systems  Constitutional: Negative for activity change, appetite change and fever.  HENT: Negative for facial swelling and trouble swallowing.   Eyes: Positive for photophobia. Negative for pain and visual disturbance.  Respiratory: Negative for shortness of breath.   Cardiovascular: Negative for chest pain.  Gastrointestinal: Negative for nausea and vomiting.  Musculoskeletal: Negative for arthralgias, neck pain and neck stiffness.  Skin: Negative for rash and wound.  Neurological: Positive for headaches. Negative for dizziness, facial asymmetry, speech difficulty, weakness and numbness.  Psychiatric/Behavioral: Negative for confusion, decreased concentration and suicidal ideas. The patient is nervous/anxious.   All other systems reviewed and are negative.    Physical Exam Updated Vital Signs BP 126/70   Pulse 64   Temp 98.1 F (36.7 C) (Oral)   Resp 16   Ht 5\' 6"  (1.676 m)   Wt 77.1 kg   SpO2 99%   BMI 27.44 kg/m   Physical Exam  Constitutional: She is oriented to person, place, and time. She appears well-developed and  well-nourished.  Patient is anxious appearing.  HENT:  Head: Normocephalic and atraumatic.  Mouth/Throat: Oropharynx is clear and moist.  Eyes: Conjunctivae and EOM are normal. Pupils are equal, round, and reactive to light.  Neck: Normal range of motion and phonation normal. Neck supple. No spinous process tenderness and no muscular tenderness present. No neck rigidity. No Brudzinski's sign and no Kernig's sign noted.  Cardiovascular: Normal rate, regular rhythm and intact distal pulses.   No murmur heard. Pulmonary/Chest: Effort normal and breath sounds normal. No respiratory distress.  Abdominal: Soft. She exhibits no distension. There is no tenderness.  Musculoskeletal: Normal range of motion.  Neurological: She is alert and oriented to person, place, and time. She has normal strength. No cranial nerve deficit or sensory deficit. She exhibits normal muscle tone. Coordination and gait normal. GCS eye subscore is 4. GCS verbal subscore is 5. GCS motor subscore is 6.  Reflex Scores:      Tricep reflexes are 2+ on the right side and 2+ on the left side.      Bicep reflexes are 2+ on the right side and 2+ on the left side. Skin: Skin is warm and dry.  Psychiatric: She has a normal mood and affect.  Nursing note and vitals reviewed.    ED Treatments / Results  Labs (all labs ordered are listed, but only abnormal results are displayed) Labs Reviewed - No data to display  EKG  EKG Interpretation None       Radiology No results found.  Procedures Procedures (including critical care time)  Medications Ordered in ED Medications  ketorolac (TORADOL) injection 60 mg (60 mg Intramuscular Given 08/18/16 1035)  LORazepam (ATIVAN) injection 1 mg (1 mg Intramuscular Given 08/18/16 1031)  diphenhydrAMINE (BENADRYL) capsule 25 mg (25 mg Oral Given 08/18/16 1038)     Initial Impression / Assessment and Plan / ED Course  I have reviewed the triage vital signs and the nursing  notes.  Pertinent labs & imaging results that were available during my care of the patient were reviewed by me  and considered in my medical decision making (see chart for details).  Clinical Course    1110  patient is feeling much better after medications. Headache has greatly improved. No focal neuro deficits on exam. No nuchal rigidity. No witnessed seizure activity during ER stay. Headache may be related to recent stressors. Family is at bedside. Patient's neurologist is Dr. Jannifer [facility]. She agrees to contact his office to arrange follow-up appointment. She appears stable for discharge at this time.  Final Clinical Impressions(s) / ED Diagnoses   Final diagnoses:  Headache disorder  Anxiety    New Prescriptions New Prescriptions   No medications on file     Bufford Lope 08/18/16 [place], MD 08/23/16 1446

## 2016-08-22 ENCOUNTER — Ambulatory Visit: Payer: Medicaid Other | Admitting: Family Medicine

## 2016-08-25 ENCOUNTER — Ambulatory Visit (INDEPENDENT_AMBULATORY_CARE_PROVIDER_SITE_OTHER): Payer: Medicaid Other | Admitting: Adult Health

## 2016-08-25 ENCOUNTER — Encounter: Payer: Self-pay | Admitting: Adult Health

## 2016-08-25 VITALS — Ht 66.0 in | Wt 172.2 lb

## 2016-08-25 DIAGNOSIS — Z5181 Encounter for therapeutic drug level monitoring: Secondary | ICD-10-CM

## 2016-08-25 DIAGNOSIS — R569 Unspecified convulsions: Secondary | ICD-10-CM | POA: Diagnosis not present

## 2016-08-25 MED ORDER — LEVETIRACETAM 1000 MG PO TABS: 1000.0000 mg | ORAL_TABLET | Freq: Two times a day (BID) | ORAL | 11 refills | Status: DC

## 2016-08-25 NOTE — Progress Notes (Signed)
PATIENT: Sheryl Hall DOB: 1961/09/19  REASON FOR VISIT: follow up- seizures HISTORY FROM: patient  HISTORY OF PRESENT ILLNESS: Sheryl Hall is a 55 year old female with a history of seizures and bipolar disorder. She returns today for follow-up. She states that she's had 2 seizures. The first seizure was on September 23. She states that she fell asleep on the couch and when she woke up she was on the floor across the room. She states that she was "tensed up, body was stiff." She denies loss of bowels or bladder. Denies biting her tongue or gums. She reports a second seizure was on September 25. She states that she woke up and was experiencing "high anxiety, hyperventilating." She again denies loss of bowels or bladder or biting her tongue. At this visit she did call EMS and was transferred to the emergency room. She reports that she has been taking Keppra 750 mg twice a day. Denies missing any medication. Reports that she was put on buspirone for racing thoughts. However she recently weaned herself off of this medication. She states both of these seizure events were not witnessed. Reports that she is not completely sure if it was seizures are not. She has a follow-up appointment with her psychiatrist in the coming weeks. She returns today for an evaluation.  HISTORY 07/15/16 (MM): Ms. Was is a 55 year old female with a history of seizures. She returns today for follow-up. The patient recently suffered a seizure on 06/22/2016. According to the ED notes the patient friend states pt "stared off, slid down, then started shaking." This lasted approximately 1 minute before resolving. In the ED the patient's Keppra was increased to 500 mg in the morning and 750 mg in the evening. The patient states that since then she's not had any additional seizure events but she has had lightheadedness that usually comes for the seizure. She does see psychiatry at [facility] She was placed on Trileptal unsure how long she  has been on this medication. She was using it for her bipolar disorder but does not feel that is beneficial. She is not operating a motor vehicle at this time. She is able to complete all ADLs independently. Denies any changes in her gait or balance. Denies any changes in her mood or behavior. She returns today for an evaluation.  HISTORY 07/05/15: Sheryl Hall is a 55 year old female with a history of seizures. She returns today for follow-up. The patient is currently taking Keppra 250 mg in the morning and 500 mg in the evening. She is tolerating this medication well. She denies any episodes of confusion or blackout events. She states that overall she is doing well. She recently had a right hip replacement. She is able to complete all ADLs independently. She operates a motor vehicle however she has refrained from driving after having the hip replacement. The patient is followed at [facility] psychiatry. She is still complaining of insomnia. She returns today for an evaluation.  HISTORY 04/13/14 (WILLIS): Sheryl Hall is a 55 year old right-handed white female with a history of cerebrovascular disease. 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 EEG study at that time was abnormal, and she has been placed on Keppra. The patient went to the emergency room on 02/25/2014 with an episode of confusion coming out of sleep. The episode lasted over one hour, and resolved after she got to the emergency room. The patient was increased on the [place] taking 1000  mg daily, but she could not tolerate the dose secondary to increased drowsiness. The patient has chronic insomnia at night, and she feels fatigued during the day. The patient has trazodone to take at night with 100 mg tablets. This is not effective for sleep, but if she takes 150 mg at night, she does sleep better. The patient is currently on Keppra taking 250 mg in the morning and 500 mg in the  evening. The patient returns to this office for an evaluation.  REVIEW OF SYSTEMS: Out of a complete 14 system review of symptoms, the patient complains only of the following symptoms, and all other reviewed systems are negative.  Dizziness, confusion, depression, nervous/anxious, ringing in ears, murmur  ALLERGIES: No Known Allergies  HOME MEDICATIONS: Outpatient Medications Prior to Visit  Medication Sig Dispense Refill  . aspirin EC 81 MG tablet Take 81 mg by mouth daily.    . busPIRone (BUSPAR) 5 MG tablet Take 5 mg by mouth at bedtime.    . clonazePAM (KLONOPIN) 1 MG tablet 1 tab po BID prn anxiety (Patient taking differently: Take 0.5 mg by mouth 3 (three) times daily as needed. ) 60 tablet 0  . esomeprazole (NEXIUM) 40 MG capsule Take 1 capsule (40 mg total) by mouth daily. 30 capsule 11  . fluticasone (FLONASE) 50 MCG/ACT nasal spray Place 2 sprays into both nostrils daily.    . folic acid (FOLVITE) A999333 MCG tablet Take 400 mcg by mouth daily.    Marland Kitchen levETIRAcetam (KEPPRA) 750 MG tablet Take 1 tablet (750 mg total) by mouth 2 (two) times daily. . 60 tablet 5  . levothyroxine (SYNTHROID, LEVOTHROID) 50 MCG tablet TAKE ONE TABLET BY MOUTH ONCE DAILY. 30 tablet 0  . linaclotide (LINZESS) 72 MCG capsule Take 1 capsule (72 mcg total) by mouth daily before breakfast. 90 capsule 2  . Oxcarbazepine (TRILEPTAL) 300 MG tablet Take 300 mg by mouth 2 (two) times daily.    . polyethylene glycol-electrolytes (TRILYTE) 420 g solution Take 4,000 mLs by mouth as directed. (Patient not taking: Reported on 07/15/2016) 4000 mL 0  . traZODone (DESYREL) 100 MG tablet Take 1 tablet (100 mg total) by mouth at bedtime. 30 tablet 0  . valACYclovir (VALTREX) 500 MG tablet Take 1 tablet (500 mg total) by mouth daily. 30 tablet 6   No facility-administered medications prior to visit.     PAST MEDICAL HISTORY: Past Medical History:  Diagnosis Date  . Anxiety   . Arthritis   . Avascular necrosis of hip ([place])     left  . Bipolar 1 disorder ([place])   . Chronic left hip pain   . Chronic nausea   . Depression   . Diverticulosis of colon   . Family hx of colon cancer    age 40-father  . Folate deficiency   . GERD (gastroesophageal reflux disease)   . Headache   . Heart murmur   . Hx of abnormal Pap smear   . Hyperlipidemia   . Hyperthyroidism   . Keratosis, actinic   . Low back pain 03/01/2013   MRI with multiple levels of disc bulge  . Panic attacks   . Polysubstance abuse   . PTSD (post-traumatic stress disorder)   . S/P colonoscopy 05/30/2010   LAX sphincter tone, anal papilla, left-sided diverticulosis, normal random biopsies,, 1 polyp-TA  . S/P endoscopy 05/30/2010   Dr Rourk-> non--critical Schatzki's ring, s/p 81F dilation  . Seizures ([place])   . Stroke [facility]) 2014   Right  parietal, no deficits   . Tubular adenoma of colon 05/30/2010   Next colonoscopy 05/2015  . UTI (urinary tract infection)     PAST SURGICAL HISTORY: Past Surgical History:  Procedure Laterality Date  . COLONOSCOPY     every 5 years  . COLONOSCOPY  05/30/2010   RMR:lax anal sphincter tone,anal papilla,otherwise normal/left-sided diverticula  . ESOPHAGOGASTRODUODENOSCOPY  05/30/2010   [id number] ring/small HH otherwise normal  . EXTERNAL EAR SURGERY Left 12 years ago   skin graft from behind ear put in ear canal  . JOINT REPLACEMENT    . NECK SURGERY  10 years ago  . SKIN CANCER DESTRUCTION    . 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 TOTAL HIP ARTHROPLASTY Right 05/23/2015   Procedure: RIGHT TOTAL HIP ARTHROPLASTY ANTERIOR APPROACH;  Surgeon: Gaynelle Arabian, MD;  Location: [facility];  Service: Orthopedics;  Laterality: Right;    FAMILY HISTORY: Family History  Problem Relation Age of Onset  . Diabetes Mother   . Hypertension Mother   . Colon cancer Father  75    living   . Hypertension Father   . Hypertension Sister   . Heart attack Brother     SOCIAL HISTORY: Social History   Social History  . Marital status: Divorced    Spouse name: N/A  . Number of children: 1  . Years of education: N/A   Occupational History  . disabled    Social History Main Topics  . Smoking status: Current Some Day Smoker    Packs/day: 0.25    Years: 30.00    Types: Cigarettes  . Smokeless tobacco: Never Used     Comment: Quit June 2016  . Alcohol use No  . Drug use: No     Comment: hx cocaine abuse  . Sexual activity: No   Other Topics Concern  . Not on file   Social History Narrative   Patient lives at home alone and she is single.    Disabled.   Education college education.   Right handed.   Caffeine mountain dew four daily.       PHYSICAL EXAM  Vitals:   08/25/16 1401  Weight: 172 lb 3.2 oz (78.1 kg)  Height: 5\' 6"  (1.676 m)   Body mass index is 27.79 kg/m.  Generalized: Well developed, in no acute distress   Neurological examination  Mentation: Alert oriented to time, place, history taking. Follows all commands speech and language fluent Cranial nerve II-XII: Pupils were equal round reactive to light. Extraocular movements were full, visual field were full on confrontational test. Facial sensation and strength were normal. Uvula tongue midline. Head turning and shoulder shrug  were normal and symmetric. Motor: The motor testing reveals 5 over 5 strength of all 4 extremities. Good symmetric motor tone is noted throughout.  Sensory: Sensory testing is intact to soft touch on all 4 extremities. No evidence of extinction is noted.  Coordination: Cerebellar testing reveals good finger-nose-finger and heel-to-shin bilaterally.  Gait and station: Gait is normal. Tandem gait is normal. Romberg is negative. No drift is seen.  Reflexes: Deep tendon reflexes are symmetric and normal bilaterally.   DIAGNOSTIC DATA (LABS, IMAGING,  TESTING) - I reviewed patient records, labs, notes, testing and imaging myself where available.  Lab Results  Component Value Date   WBC 5.2 06/22/2016   HGB 12.8 06/22/2016  HCT 38.8 06/22/2016   MCV 86.8 06/22/2016   PLT 225 06/22/2016      Component Value Date/Time   NA 140 06/22/2016 1816   NA 144 10/27/2014 0625   K 4.0 06/22/2016 1816   K 4.2 10/27/2014 0625   K 4.4 08/27/2011 1314   CL 110 06/22/2016 1816   CL 107 10/27/2014 0625   CL 102 08/27/2011 1314   CO2 24 06/22/2016 1816   CO2 29 10/27/2014 0625   CO2 25 08/27/2011 1314   GLUCOSE 97 06/22/2016 1816   GLUCOSE 94 10/27/2014 0625   BUN 11 06/22/2016 1816   BUN 10 10/27/2014 0625   CREATININE 0.90 06/22/2016 1816   CREATININE 1.12 (H) 02/19/2016 1543   CALCIUM 8.5 (L) 06/22/2016 1816   CALCIUM 8.9 10/27/2014 0625   CALCIUM 9.9 08/27/2011 1314   PROT 6.5 06/22/2016 1816   PROT 5.9 (L) 10/27/2014 0625   ALBUMIN 3.7 06/22/2016 1816   ALBUMIN 3.3 (L) 10/27/2014 0625   AST 13 (L) 06/22/2016 1816   AST 16 10/27/2014 0625   AST 11 08/27/2011 1314   ALT 10 (L) 06/22/2016 1816   ALT 14 10/27/2014 0625   ALKPHOS 107 06/22/2016 1816   ALKPHOS 119 (H) 10/27/2014 0625   ALKPHOS 91 08/27/2011 1314   BILITOT 0.3 06/22/2016 1816   BILITOT 0.4 10/27/2014 0625   BILITOT 0.5 08/27/2011 1314   GFRNONAA >60 06/22/2016 1816   GFRNONAA 56 (L) 02/19/2016 1543   GFRAA >60 06/22/2016 1816   GFRAA 65 02/19/2016 1543       ASSESSMENT AND PLAN 55 y.o. year old female  has a past medical history of Anxiety; Arthritis; Avascular necrosis of hip ([place]); Bipolar 1 disorder ([place]); Chronic left hip pain; Chronic nausea; Depression; Diverticulosis of colon; Family hx of colon cancer; Folate deficiency; GERD (gastroesophageal reflux disease); Headache; Heart murmur; abnormal Pap smear; Hyperlipidemia; Hyperthyroidism; Keratosis, actinic; Low back pain (03/01/2013); Panic attacks; Polysubstance abuse; PTSD (post-traumatic stress  disorder); S/P colonoscopy (05/30/2010); S/P endoscopy (05/30/2010); Seizures ([place]); Stroke [facility]) (2014); Tubular adenoma of colon (05/30/2010); and UTI (urinary tract infection). here with:  1. Seizures  I will increase Keppra to 1000 mg twice a day. I will check blood work today. The patient is encouraged to keep her follow-up appointment with her psychiatrist. She will follow-up with our office in 2 months or sooner if needed. She will call if she has any additional seizures.     Ward Givens, MSN, NP-C 08/25/2016, 2:06 PM [facility] [address], [place], [place] [address] [phone]

## 2016-08-25 NOTE — Patient Instructions (Signed)
Increase Keppra 1000 mg twice a day Blood work today If you have any seizure events please let us know.

## 2016-08-25 NOTE — Progress Notes (Signed)
I have read the note, and I agree with the clinical assessment and plan.  WILLIS,CHARLES KEITH   

## 2016-08-26 LAB — LEVETIRACETAM LEVEL: LEVETIRACETAM: 27.1 ug/mL (ref 10.0–40.0)

## 2016-08-26 LAB — CBC WITH DIFFERENTIAL/PLATELET
BASOS: 1 %
Basophils Absolute: 0 10*3/uL (ref 0.0–0.2)
EOS (ABSOLUTE): 0.1 10*3/uL (ref 0.0–0.4)
EOS: 3 %
HEMATOCRIT: 39.9 % (ref 34.0–46.6)
Hemoglobin: 13 g/dL (ref 11.1–15.9)
IMMATURE GRANULOCYTES: 0 %
Immature Grans (Abs): 0 10*3/uL (ref 0.0–0.1)
LYMPHS ABS: 2.3 10*3/uL (ref 0.7–3.1)
Lymphs: 44 %
MCH: 28.7 pg (ref 26.6–33.0)
MCHC: 32.6 g/dL (ref 31.5–35.7)
MCV: 88 fL (ref 79–97)
MONOS ABS: 0.4 10*3/uL (ref 0.1–0.9)
Monocytes: 8 %
NEUTROS ABS: 2.4 10*3/uL (ref 1.4–7.0)
Neutrophils: 44 %
PLATELETS: 255 10*3/uL (ref 150–379)
RBC: 4.53 x10E6/uL (ref 3.77–5.28)
RDW: 14.4 % (ref 12.3–15.4)
WBC: 5.2 10*3/uL (ref 3.4–10.8)

## 2016-08-26 LAB — COMPREHENSIVE METABOLIC PANEL
ALBUMIN: 4.1 g/dL (ref 3.5–5.5)
ALT: 10 IU/L (ref 0–32)
AST: 18 IU/L (ref 0–40)
Albumin/Globulin Ratio: 1.9 (ref 1.2–2.2)
Alkaline Phosphatase: 131 IU/L — ABNORMAL HIGH (ref 39–117)
BUN / CREAT RATIO: 9 (ref 9–23)
BUN: 7 mg/dL (ref 6–24)
Bilirubin Total: <0.2 mg/dL (ref 0.0–1.2)
CALCIUM: 9.1 mg/dL (ref 8.7–10.2)
CO2: 26 mmol/L (ref 18–29)
CREATININE: 0.82 mg/dL (ref 0.57–1.00)
Chloride: 101 mmol/L (ref 96–106)
GFR, EST AFRICAN AMERICAN: 94 mL/min/{1.73_m2} (ref >59)
GFR, EST NON AFRICAN AMERICAN: 81 mL/min/{1.73_m2} (ref >59)
GLOBULIN, TOTAL: 2.2 g/dL (ref 1.5–4.5)
Glucose: 86 mg/dL (ref 65–99)
Potassium: 5 mmol/L (ref 3.5–5.2)
SODIUM: 141 mmol/L (ref 134–144)
Total Protein: 6.3 g/dL (ref 6.0–8.5)

## 2016-08-28 ENCOUNTER — Telehealth: Payer: Self-pay | Admitting: *Deleted

## 2016-08-28 NOTE — Telephone Encounter (Signed)
Patient returned call regarding lab results, please call [phone].

## 2016-08-28 NOTE — Telephone Encounter (Signed)
Fax confirmation received. 

## 2016-08-28 NOTE — Telephone Encounter (Signed)
-----   Message from Ward Givens, NP sent at 08/27/2016  7:29 AM EDT ----- Lab work unremarkable- with exception alkaline phosphatase is slightly elevated. Please call patient.

## 2016-08-28 NOTE — Telephone Encounter (Signed)
GAve lab results to pt.  (unremarkable, except for liver enzyme (alkaline phos).  No additional labs needed.  Will fax to Dr. Jenna Luo , pcp. She stated she needs to have appt with him anyway to check her thyroid.

## 2016-09-01 ENCOUNTER — Ambulatory Visit (INDEPENDENT_AMBULATORY_CARE_PROVIDER_SITE_OTHER): Payer: Medicaid Other | Admitting: Gastroenterology

## 2016-09-01 ENCOUNTER — Encounter: Payer: Self-pay | Admitting: Gastroenterology

## 2016-09-01 VITALS — BP 123/77 | HR 82 | Temp 98.1°F | Ht 66.0 in | Wt 170.2 lb

## 2016-09-01 DIAGNOSIS — K581 Irritable bowel syndrome with constipation: Secondary | ICD-10-CM | POA: Diagnosis not present

## 2016-09-01 DIAGNOSIS — K219 Gastro-esophageal reflux disease without esophagitis: Secondary | ICD-10-CM

## 2016-09-01 NOTE — Assessment & Plan Note (Signed)
Continue Nexium once daily.

## 2016-09-01 NOTE — Patient Instructions (Signed)
Keep taking Linzess 72 mcg each morning, 30 minutes before breakfast.   We will see you back in 3 months and talk about a colonoscopy then.

## 2016-09-01 NOTE — Assessment & Plan Note (Signed)
Doing well with Linzess 72 mcg once daily. Continue current dosing. Return in 3 months to discuss colonoscopy.

## 2016-09-01 NOTE — Progress Notes (Signed)
Referring Provider: Susy Frizzle, MD Primary Care Physician:  Odette Fraction, MD  Primary GI: Dr. Gala Romney   Chief Complaint  Patient presents with  . Follow-up    doing ok    HPI:   Sheryl Hall is a 55 y.o. female presenting today with a history of IBS-C, chronic GERD, gastroparesis, due for colonoscopy now due to history of tubular adenomas. Last EGD in 2011 with non-critical Schatzki's ring s/p dilation. At times notes solid food dysphagia. Prescribed Linzess 72 mcg.   Linzess doing well for patient. BM every day. No rectal bleeding. Doing well with nausea. No vomiting. Solid food dysphagia improved. Keppra dosing is making her more constipated. Feels like she is getting better though with Linzess. Having some other medical issues right now to include ear issues, mental health issues, etc and needs to postpone colonoscopy right now. Not sure when she can pursue this. Needs to hold off on this right now.   Past Medical History:  Diagnosis Date  . Anxiety   . Arthritis   . Avascular necrosis of hip ([place])    left  . Bipolar 1 disorder ([place])   . Chronic left hip pain   . Chronic nausea   . Depression   . Diverticulosis of colon   . Family hx of colon cancer    age 19-father  . Folate deficiency   . GERD (gastroesophageal reflux disease)   . Headache   . Heart murmur   . Hx of abnormal Pap smear   . Hyperlipidemia   . Hyperthyroidism   . Keratosis, actinic   . Low back pain 03/01/2013   MRI with multiple levels of disc bulge  . Panic attacks   . Polysubstance abuse   . PTSD (post-traumatic stress disorder)   . S/P colonoscopy 05/30/2010   LAX sphincter tone, anal papilla, left-sided diverticulosis, normal random biopsies,, 1 polyp-TA  . S/P endoscopy 05/30/2010   Dr Rourk-> non--critical Schatzki's ring, s/p 55F dilation  . Seizures ([place])   . Stroke [facility]) 2014   Right parietal, no deficits   . Tubular adenoma of colon 05/30/2010   Next colonoscopy 05/2015  . UTI  (urinary tract infection)     Past Surgical History:  Procedure Laterality Date  . COLONOSCOPY     every 5 years  . COLONOSCOPY  05/30/2010   RMR:lax anal sphincter tone,anal papilla,otherwise normal/left-sided diverticula  . ESOPHAGOGASTRODUODENOSCOPY  05/30/2010   [id number] ring/small HH otherwise normal  . EXTERNAL EAR SURGERY Left 12 years ago   skin graft from behind ear put in ear canal  . JOINT REPLACEMENT    . NECK SURGERY  10 years ago  . SKIN CANCER DESTRUCTION    . 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 TOTAL HIP ARTHROPLASTY Right 05/23/2015   Procedure: RIGHT TOTAL HIP ARTHROPLASTY ANTERIOR APPROACH;  Surgeon: Gaynelle Arabian, MD;  Location: [facility];  Service: Orthopedics;  Laterality: Right;    Current Outpatient Prescriptions  Medication Sig Dispense Refill  . aspirin EC 81 MG tablet Take 81 mg by mouth daily.    . clonazePAM (KLONOPIN) 1 MG tablet 1 tab po BID prn anxiety (Patient taking differently: Take 0.5 mg by mouth 3 (three) times daily. ) 60 tablet 0  . esomeprazole (NEXIUM) 40 MG capsule Take 1 capsule (40 mg total) by mouth daily.  30 capsule 11  . fluticasone (FLONASE) 50 MCG/ACT nasal spray Place 2 sprays into both nostrils daily.    . folic acid (FOLVITE) A999333 MCG tablet Take 400 mcg by mouth daily.    Marland Kitchen levETIRAcetam (KEPPRA) 1000 MG tablet Take 1 tablet (1,000 mg total) by mouth 2 (two) times daily. . 60 tablet 11  . levothyroxine (SYNTHROID, LEVOTHROID) 50 MCG tablet TAKE ONE TABLET BY MOUTH ONCE DAILY. 30 tablet 0  . linaclotide (LINZESS) 72 MCG capsule Take 1 capsule (72 mcg total) by mouth daily before breakfast. 90 capsule 2  . Oxcarbazepine (TRILEPTAL) 300 MG tablet Take 300 mg by mouth 2 (two) times daily.    . traZODone (DESYREL) 100 MG tablet Take 1 tablet (100 mg total) by mouth at bedtime.  30 tablet 0  . valACYclovir (VALTREX) 500 MG tablet Take 1 tablet (500 mg total) by mouth daily. 30 tablet 6   No current facility-administered medications for this visit.     Allergies as of 09/01/2016  . (No Known Allergies)    Family History  Problem Relation Age of Onset  . Diabetes Mother   . Hypertension Mother   . Colon cancer Father 30    living   . Hypertension Father   . Hypertension Sister   . Heart attack Brother   . Seizures Maternal Aunt     Social History   Social History  . Marital status: Divorced    Spouse name: N/A  . Number of children: 1  . Years of education: N/A   Occupational History  . disabled    Social History Main Topics  . Smoking status: Current Some Day Smoker    Packs/day: 0.25    Years: 30.00    Types: Cigarettes  . Smokeless tobacco: Never Used     Comment: trying to quit  . Alcohol use No  . Drug use: No     Comment: hx cocaine abuse  . Sexual activity: No   Other Topics Concern  . None   Social History Narrative   Patient lives at home alone and she is single.    Disabled.   Education college education.   Right handed.   Caffeine mountain dew four daily.     Review of Systems: As mentioned in HPI   Physical Exam: BP 123/77   Pulse 82   Temp 98.1 F (36.7 C) (Oral)   Ht 5\' 6"  (1.676 m)   Wt 170 lb 3.2 oz (77.2 kg)   BMI 27.47 kg/m  General:   Alert and oriented. No distress noted. Pleasant and cooperative.  Head:  Normocephalic and atraumatic. Eyes:  Conjuctiva clear without scleral icterus. Abdomen:  +BS, soft, non-tender and non-distended. No rebound or guarding. No HSM or masses noted. Msk:  Symmetrical without gross deformities. Normal posture. Extremities:  Without edema. Neurologic:  Alert and  oriented x4;  grossly normal neurologically. Psych:  Alert and cooperative. Normal mood and affect.

## 2016-09-02 NOTE — Progress Notes (Signed)
cc'ed to pcp °

## 2016-09-29 ENCOUNTER — Ambulatory Visit (INDEPENDENT_AMBULATORY_CARE_PROVIDER_SITE_OTHER): Payer: Medicaid Other | Admitting: Family Medicine

## 2016-09-29 ENCOUNTER — Encounter: Payer: Self-pay | Admitting: Family Medicine

## 2016-09-29 VITALS — BP 126/84 | HR 76 | Temp 99.0°F | Resp 18 | Ht 66.25 in | Wt 170.0 lb

## 2016-09-29 DIAGNOSIS — Z23 Encounter for immunization: Secondary | ICD-10-CM | POA: Diagnosis not present

## 2016-09-29 DIAGNOSIS — Z0000 Encounter for general adult medical examination without abnormal findings: Secondary | ICD-10-CM

## 2016-09-29 DIAGNOSIS — K219 Gastro-esophageal reflux disease without esophagitis: Secondary | ICD-10-CM

## 2016-09-29 MED ORDER — OMEPRAZOLE 40 MG PO CPDR: 40.0000 mg | DELAYED_RELEASE_CAPSULE | Freq: Two times a day (BID) | ORAL | 3 refills | Status: DC

## 2016-09-29 NOTE — Progress Notes (Addendum)
Subjective:    Patient ID: Sheryl Hall, female    DOB: August 16, 1961, 55 y.o.   MRN: [id number]  HPI Patient is here today for complete physical exam. Her mammogram was performed in August and was normal. She is having a Pap smear performed today. Her last Pap smear was in 2013. She is due for colonoscopy. Her last colonoscopy was in 2011. She is currently on are a 5 year surveillance program due to her family history of colon cancer. She will see her gastroenterologist later this year to schedule this. She is due today for a flu shot as well as the pneumonia vaccine because of her smoking. Recently she started having seizures and her neurologist increased her Keppra to 1000 mg twice a day. She's been on this dose now for possibly one month with no side effects. A CBC and CMP were normal when checked earlier this month. She is due for fasting lipid panel as well as a TSH to monitor her dosage of Synthroid.  Past Medical History:  Diagnosis Date  . Anxiety   . Arthritis   . Avascular necrosis of hip ([place])    left  . Bipolar 1 disorder ([place])   . Chronic left hip pain   . Chronic nausea   . Depression   . Diverticulosis of colon   . Family hx of colon cancer    age 64-father  . Folate deficiency   . GERD (gastroesophageal reflux disease)   . Headache   . Heart murmur   . Hx of abnormal Pap smear   . Hyperlipidemia   . Hyperthyroidism   . Keratosis, actinic   . Low back pain 03/01/2013   MRI with multiple levels of disc bulge  . Panic attacks   . Polysubstance abuse   . PTSD (post-traumatic stress disorder)   . S/P colonoscopy 05/30/2010   LAX sphincter tone, anal papilla, left-sided diverticulosis, normal random biopsies,, 1 polyp-TA  . S/P endoscopy 05/30/2010   Dr Rourk-> non--critical Schatzki's ring, s/p 75F dilation  . Seizures ([place])   . Stroke [facility]) 2014   Right parietal, no deficits   . Tubular adenoma of colon 05/30/2010   Next colonoscopy 05/2015  . UTI (urinary tract  infection)    Past Surgical History:  Procedure Laterality Date  . COLONOSCOPY     every 5 years  . COLONOSCOPY  05/30/2010   RMR:lax anal sphincter tone,anal papilla,otherwise normal/left-sided diverticula  . ESOPHAGOGASTRODUODENOSCOPY  05/30/2010   [id number] ring/small HH otherwise normal  . EXTERNAL EAR SURGERY Left 12 years ago   skin graft from behind ear put in ear canal  . JOINT REPLACEMENT    . NECK SURGERY  10 years ago  . SKIN CANCER DESTRUCTION    . 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 TOTAL HIP ARTHROPLASTY Right 05/23/2015   Procedure: RIGHT TOTAL HIP ARTHROPLASTY ANTERIOR APPROACH;  Surgeon: Gaynelle Arabian, MD;  Location: [facility];  Service: Orthopedics;  Laterality: Right;   Current Outpatient Prescriptions on File Prior to Visit  Medication Sig Dispense Refill  . aspirin EC 81 MG tablet Take 81 mg by mouth daily.    . clonazePAM (KLONOPIN) 1 MG tablet 1 tab po BID prn anxiety (Patient taking differently: Take 0.5 mg by mouth 3 (three) times daily. ) 60 tablet 0  . esomeprazole (NEXIUM) 40 MG  capsule Take 1 capsule (40 mg total) by mouth daily. 30 capsule 11  . fluticasone (FLONASE) 50 MCG/ACT nasal spray Place 2 sprays into both nostrils daily.    . folic acid (FOLVITE) [id number] MCG tablet Take 400 mcg by mouth daily.    Marland Kitchen levETIRAcetam (KEPPRA) 1000 MG tablet Take 1 tablet (1,000 mg total) by mouth 2 (two) times daily. . 60 tablet 11  . levothyroxine (SYNTHROID, LEVOTHROID) 50 MCG tablet TAKE ONE TABLET BY MOUTH ONCE DAILY. 30 tablet 0  . linaclotide (LINZESS) 72 MCG capsule Take 1 capsule (72 mcg total) by mouth daily before breakfast. 90 capsule 2  . Oxcarbazepine (TRILEPTAL) 300 MG tablet Take 300 mg by mouth 2 (two) times daily.    . traZODone (DESYREL) 100 MG tablet Take 1 tablet (100 mg total) by mouth at  bedtime. 30 tablet 0  . valACYclovir (VALTREX) 500 MG tablet Take 1 tablet (500 mg total) by mouth daily. 30 tablet 6   No current facility-administered medications on file prior to visit.    No Known Allergies Social History   Social History  . Marital status: Divorced    Spouse name: N/A  . Number of children: 1  . Years of education: N/A   Occupational History  . disabled    Social History Main Topics  . Smoking status: Current Some Day Smoker    Packs/day: 0.25    Years: 30.00    Types: Cigarettes  . Smokeless tobacco: Never Used     Comment: trying to quit  . Alcohol use No  . Drug use: No     Comment: hx cocaine abuse  . Sexual activity: No   Other Topics Concern  . Not on file   Social History Narrative   Patient lives at home alone and she is single.    Disabled.   Education college education.   Right handed.   Caffeine mountain dew four daily.    Family History  Problem Relation Age of Onset  . Diabetes Mother   . Hypertension Mother   . Colon cancer Father 31    living   . Hypertension Father   . Hypertension Sister   . Heart attack Brother   . Seizures Maternal Aunt       Review of Systems  All other systems reviewed and are negative.      Objective:   Physical Exam  Constitutional: She is oriented to person, place, and time. She appears well-developed and well-nourished. No distress.  HENT:  Head: Normocephalic and atraumatic.  Right Ear: External ear normal.  Left Ear: External ear normal.  Nose: Nose normal.  Mouth/Throat: Oropharynx is clear and moist. No oropharyngeal exudate.  Eyes: Conjunctivae and EOM are normal. Pupils are equal, round, and reactive to light. Right eye exhibits no discharge. Left eye exhibits no discharge. No scleral icterus.  Neck: Normal range of motion. Neck supple. No JVD present. No tracheal deviation present. No thyromegaly present.  Cardiovascular: Normal rate, regular rhythm, normal heart sounds and  intact distal pulses.  Exam reveals no gallop and no friction rub.   No murmur heard. Pulmonary/Chest: Effort normal and breath sounds normal. No stridor. No respiratory distress. She has no wheezes. She has no rales. She exhibits no tenderness.  Abdominal: Soft. Bowel sounds are normal. She exhibits no distension and no mass. There is no tenderness. There is no rebound and no guarding.  Genitourinary: Vagina normal and uterus normal. No vaginal discharge found.  Musculoskeletal: Normal range  of motion. She exhibits no edema, tenderness or deformity.  Lymphadenopathy:    She has no cervical adenopathy.  Neurological: She is alert and oriented to person, place, and time. She has normal reflexes. She displays normal reflexes. No cranial nerve deficit. She exhibits normal muscle tone. Coordination normal.  Skin: Skin is warm. No rash noted. She is not diaphoretic. No erythema. No pallor.  Psychiatric: She has a normal mood and affect. Her behavior is normal. Judgment and thought content normal.  Vitals reviewed.         Assessment & Plan:  Routine general medical examination at a health care facility - Plan: Lipid panel, TSH, PAP, Thin Prep w/HPV rflx HPV Type 16/18 ([facility])  Pap smear was sent to pathology and labeled container. Breast exam was normal. Pelvic exam was normal. Patient will schedule colonoscopy later this year with her gastroenterologist. Mammogram is up-to-date. She received a flu shot and Pneumovax 23 today in clinic. I'll also check a TSH as well as a fasting lipid panel. Recent CBC and CMP within normal limits. Regular anticipatory guidance is provided.  Also reports GERD previously well controlled on dexilant but now uncontrolled on Nexium 40 poqday.  Try prilosec 40 bid.

## 2016-09-29 NOTE — Addendum Note (Signed)
Addended by: Jenna Luo on: 09/29/2016 03:33 PM   Modules accepted: Orders

## 2016-09-30 DIAGNOSIS — Z23 Encounter for immunization: Secondary | ICD-10-CM | POA: Diagnosis not present

## 2016-09-30 LAB — LIPID PANEL
CHOL/HDL RATIO: 5.7 ratio — AB (ref <5.0)
Cholesterol: 240 mg/dL — ABNORMAL HIGH (ref <200)
HDL: 42 mg/dL — AB (ref >50)
LDL Cholesterol: 163 mg/dL — ABNORMAL HIGH
Triglycerides: 175 mg/dL — ABNORMAL HIGH (ref <150)
VLDL: 35 mg/dL — AB (ref <30)

## 2016-09-30 LAB — TSH: TSH: 2.71 m[IU]/L

## 2016-09-30 NOTE — Addendum Note (Signed)
Addended by: Shary Decamp B on: 09/30/2016 09:17 AM   Modules accepted: Orders

## 2016-10-01 ENCOUNTER — Encounter: Payer: Self-pay | Admitting: Family Medicine

## 2016-10-01 LAB — PAP, THIN PREP W/HPV RFLX HPV TYPE 16/18: HPV DNA High Risk: NOT DETECTED

## 2016-10-20 ENCOUNTER — Other Ambulatory Visit: Payer: Self-pay | Admitting: Family Medicine

## 2016-10-21 ENCOUNTER — Encounter: Payer: Self-pay | Admitting: Family Medicine

## 2016-10-21 ENCOUNTER — Ambulatory Visit (INDEPENDENT_AMBULATORY_CARE_PROVIDER_SITE_OTHER): Payer: Medicaid Other | Admitting: Family Medicine

## 2016-10-21 VITALS — BP 122/78 | HR 78 | Temp 98.4°F | Resp 14 | Ht 66.5 in | Wt 172.0 lb

## 2016-10-21 DIAGNOSIS — F4310 Post-traumatic stress disorder, unspecified: Secondary | ICD-10-CM | POA: Diagnosis not present

## 2016-10-21 DIAGNOSIS — F419 Anxiety disorder, unspecified: Secondary | ICD-10-CM

## 2016-10-21 DIAGNOSIS — F3163 Bipolar disorder, current episode mixed, severe, without psychotic features: Secondary | ICD-10-CM | POA: Diagnosis not present

## 2016-10-21 MED ORDER — TRAZODONE HCL 50 MG PO TABS: 50.0000 mg | ORAL_TABLET | Freq: Every day | ORAL | Status: DC

## 2016-10-21 MED ORDER — LORAZEPAM 1 MG PO TABS: 1.0000 mg | ORAL_TABLET | Freq: Two times a day (BID) | ORAL | 1 refills | Status: DC | PRN

## 2016-10-21 NOTE — Patient Instructions (Signed)
Start ativan 1mg  twice a day for anxiety Continue trazodone and trileptal F/U 2 weeks- Dr. Dennard Schaumann
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
## 2016-10-21 NOTE — Assessment & Plan Note (Signed)
Bipolar disorder with underlying anxiety panic attacks PTSD. She is significant past mental history which is very difficult to treat. She's been on multiple medications as well. She has not one return to the psychiatrist at [facility] and I do understand her concerns with regards to the multiple medications the side effects she is having and issue with having therapy at that office. For now monogamous or any other medications as awfully she is exhausted even the newer agents. I will defer to her primary care provider who I did speak to prior to this visit and he is aware of her history. I will start her on lorazepam 1 mg twice a day she would discontinue the clonazepam she will continue the Trileptal for now and continue the trazodone. She will follow-up in 2 weeks for recheck. The meantime we will see if she can get established with a new therapist or psychiatrist [place].

## 2016-10-22 ENCOUNTER — Ambulatory Visit: Payer: Medicaid Other | Admitting: Adult Health

## 2016-10-27 ENCOUNTER — Encounter: Payer: Self-pay | Admitting: Adult Health

## 2016-10-27 ENCOUNTER — Ambulatory Visit (INDEPENDENT_AMBULATORY_CARE_PROVIDER_SITE_OTHER): Payer: Medicaid Other | Admitting: Adult Health

## 2016-10-27 VITALS — BP 114/73 | Ht 65.5 in | Wt 173.6 lb

## 2016-10-27 DIAGNOSIS — R569 Unspecified convulsions: Secondary | ICD-10-CM | POA: Diagnosis not present

## 2016-10-27 NOTE — Progress Notes (Signed)
I have read the note, and I agree with the clinical assessment and plan.  Sheryl Hall,Sheryl Hall   

## 2016-10-27 NOTE — Patient Instructions (Signed)
Continue Keppra 1000 mg twice a day No driving for 6 months  If your symptoms worsen or you develop new symptoms please let us know.

## 2016-10-27 NOTE — Progress Notes (Signed)
PATIENT: Sheryl Hall DOB: [birth year]  REASON FOR VISIT: follow up- seizures, bipolar disorder HISTORY FROM: patient  HISTORY OF PRESENT ILLNESS: [name] is a 55 year old female with a history of seizures and bipolar disorder. She returns today for follow-up. At the last visit she was increased on Keppra taking 1000mg  twice a day. She states that she is not had any additional seizure events. She is able to complete all ADLs independently. She is not operating a motor vehicle. Denies any changes with her mood or behavior. Reports that her psychiatrist recently switched her to Ativan to control her anxiety. She denies any new neurological symptoms. Returns today for an evaluation.   HISTORY 08/25/16 (MM): [name] is a 55 year old female with a history of seizures and bipolar disorder. She returns today for follow-up. She states that she's had 2 seizures. The first seizure was on September 23. She states that she fell asleep on the couch and when she woke up she was on the floor across the room. She states that she was "tensed up, body was stiff." She denies loss of bowels or bladder. Denies biting her tongue or gums. She reports a second seizure was on September 25. She states that she woke up and was experiencing "high anxiety, hyperventilating." She again denies loss of bowels or bladder or biting her tongue. At this visit she did call EMS and was transferred to the emergency room. She reports that she has been taking Keppra 750 mg twice a day. Denies missing any medication. Reports that she was put on buspirone for racing thoughts. However she recently weaned herself off of this medication. She states both of these seizure events were not witnessed. Reports that she is not completely sure if it was seizures are not. She has a follow-up appointment with her psychiatrist in the coming weeks. She returns today for an evaluation.  HISTORY 07/15/16 (MM):[name] is a 55 year old female with a  history of seizures. She returns today for follow-up. The patient recently suffered a seizure on 06/22/2016. According to the ED notes the patient friend states pt "stared off, slid down, then started shaking." This lasted approximately 1 minute before resolving. In the ED the patient's Keppra was increased to 500 mg in the morning and 750 mg in the evening. The patient states that since then she's not had any additional seizure events but she has had lightheadedness that usually comes for the seizure. She does see psychiatry at [facility]. She was placed on Trileptal unsure how long she has been on this medication. She was using it for her bipolar disorder but does not feel that is beneficial.She is not operating a motor vehicle at this time. She is able to complete all ADLs independently. Denies any changes in her gait or balance. Denies any changes in her mood or behavior. She returns today for an evaluation.  HISTORY 07/05/15: [name] is a 55 year old female with a history of seizures. She returns today for follow-up. The patient is currently taking Keppra 250 mg in the morning and 500 mg in the evening. She is tolerating this medication well. She denies any episodes of confusion or blackout events. She states that overall she is doing well. She recently had a right hip replacement. She is able to complete all ADLs independently. She operates a motor vehicle however she has refrained from driving after having the hip replacement. The patient is followed at [facility] psychiatry. She is still complaining of insomnia. She returns today for  an evaluation.  HISTORY 04/13/14 (WILLIS): [name] is a 55 year old right-handed white female with a history of cerebrovascular disease. 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 EEG study at that time was abnormal, and she has been placed on Keppra. The patient went to the emergency room on 02/25/2014  with an episode of confusion coming out of sleep. The episode lasted over one hour, and resolved after she got to the emergency room. The patient was increased on the [place] taking 1000 mg daily, but she could not tolerate the dose secondary to increased drowsiness. The patient has chronic insomnia at night, and she feels fatigued during the day. The patient has trazodone to take at night with 100 mg tablets. This is not effective for sleep, but if she takes 150 mg at night, she does sleep better. The patient is currently on Keppra taking 250 mg in the morning and 500 mg in the evening. The patient returns to this office for an evaluation.  REVIEW OF SYSTEMS: Out of a complete 14 system review of symptoms, the patient complains only of the following symptoms, and all other reviewed systems are negative.  See history of present illness  ALLERGIES: No Known Allergies  HOME MEDICATIONS: Outpatient Medications Prior to Visit  Medication Sig Dispense Refill  . aspirin EC 81 MG tablet Take 81 mg by mouth daily.    Marland Kitchen esomeprazole (NEXIUM) 40 MG capsule Take 1 capsule (40 mg total) by mouth daily. 30 capsule 11  . fluticasone (FLONASE) 50 MCG/ACT nasal spray Place 2 sprays into both nostrils daily.    . folic acid (FOLVITE) A999333 MCG tablet Take 400 mcg by mouth daily.    Marland Kitchen levETIRAcetam (KEPPRA) 1000 MG tablet Take 1 tablet (1,000 mg total) by mouth 2 (two) times daily. . 60 tablet 11  . levothyroxine (SYNTHROID, LEVOTHROID) 50 MCG tablet TAKE ONE TABLET BY MOUTH ONCE DAILY. 30 tablet 11  . linaclotide (LINZESS) 72 MCG capsule Take 1 capsule (72 mcg total) by mouth daily before breakfast. 90 capsule 2  . LORazepam (ATIVAN) 1 MG tablet Take 1 tablet (1 mg total) by mouth 2 (two) times daily as needed for anxiety. 60 tablet 1  . omeprazole (PRILOSEC) 40 MG capsule Take 1 capsule (40 mg total) by mouth 2 (two) times daily. D/c nexium 60 capsule 3  . Oxcarbazepine (TRILEPTAL) 300 MG tablet Take 300 mg by  mouth 2 (two) times daily.    . traZODone (DESYREL) 50 MG tablet Take 1 tablet (50 mg total) by mouth at bedtime.    . valACYclovir (VALTREX) 500 MG tablet Take 1 tablet (500 mg total) by mouth daily. 30 tablet 6   No facility-administered medications prior to visit.     PAST MEDICAL HISTORY: Past Medical History:  Diagnosis Date  . Anxiety   . Arthritis   . Avascular necrosis of hip ([place])    left  . Bipolar 1 disorder ([place])   . Chronic left hip pain   . Chronic nausea   . Depression   . Diverticulosis of colon   . Family hx of colon cancer    age 21-father  . Folate deficiency   . GERD (gastroesophageal reflux disease)   . Headache   . Heart murmur   . Hx of abnormal Pap smear   . Hyperlipidemia   . Hyperthyroidism   . Keratosis, actinic   . Low back pain 03/01/2013  MRI with multiple levels of disc bulge  . Panic attacks   . Polysubstance abuse   . PTSD (post-traumatic stress disorder)   . S/P colonoscopy 05/30/2010   LAX sphincter tone, anal papilla, left-sided diverticulosis, normal random biopsies,, 1 polyp-TA  . S/P endoscopy 05/30/2010   Dr Rourk-> non--critical Schatzki's ring, s/p 61F dilation  . Seizures ([place])   . Stroke [facility]) 2014   Right parietal, no deficits   . Tubular adenoma of colon 05/30/2010   Next colonoscopy 05/2015  . UTI (urinary tract infection)     PAST SURGICAL HISTORY: Past Surgical History:  Procedure Laterality Date  . COLONOSCOPY     every 5 years  . COLONOSCOPY  05/30/2010   RMR:lax anal sphincter tone,anal papilla,otherwise normal/left-sided diverticula  . ESOPHAGOGASTRODUODENOSCOPY  05/30/2010   [id number] ring/small HH otherwise normal  . EXTERNAL EAR SURGERY Left 12 years ago   skin graft from behind ear put in ear canal  . JOINT REPLACEMENT    . NECK SURGERY  10 years ago  . SKIN CANCER DESTRUCTION    . 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 TOTAL HIP ARTHROPLASTY Right 05/23/2015   Procedure: RIGHT TOTAL HIP ARTHROPLASTY ANTERIOR APPROACH;  Surgeon: Gaynelle Arabian, MD;  Location: [facility];  Service: Orthopedics;  Laterality: Right;    FAMILY HISTORY: Family History  Problem Relation Age of Onset  . Diabetes Mother   . Hypertension Mother   . Colon cancer Father 56    living   . Hypertension Father   . Hypertension Sister   . Heart attack Brother   . Seizures Maternal Aunt     SOCIAL HISTORY: Social History   Social History  . Marital status: Divorced    Spouse name: N/A  . Number of children: 1  . Years of education: N/A   Occupational History  . disabled    Social History Main Topics  . Smoking status: Current Some Day Smoker    Packs/day: 0.25    Years: 30.00    Types: Cigarettes  . Smokeless tobacco: Never Used     Comment: trying to quit  . Alcohol use No  . Drug use: No     Comment: hx cocaine abuse  . Sexual activity: No   Other Topics Concern  . Not on file   Social History Narrative   Patient lives at home alone and she is single.    Disabled.   Education college education.   Right handed.   Caffeine mountain dew four daily.       PHYSICAL EXAM  Vitals:   10/27/16 1444  BP: 114/73  Weight: 173 lb 9.6 oz (78.7 kg)  Height: 5' 5.5" (1.664 m)   Body mass index is 28.45 kg/m.  Generalized: Well developed, in no acute distress   Neurological examination  Mentation: Alert oriented to time, place, history taking. Follows all commands speech and language fluent Cranial nerve II-XII: Pupils were equal round reactive to light. Extraocular movements were full, visual field were full on confrontational test. Facial sensation and strength were normal. Uvula tongue midline. Head turning and shoulder shrug  were normal and symmetric. Motor: The motor testing reveals 5 over 5 strength of all 4 extremities. Good  symmetric motor tone is noted throughout.  Sensory: Sensory testing is intact to soft touch on all 4 extremities. No  evidence of extinction is noted.  Coordination: Cerebellar testing reveals good finger-nose-finger and heel-to-shin bilaterally.  Gait and station: Gait is normal. Tandem gait is normal. Romberg is negative. No drift is seen.  Reflexes: Deep tendon reflexes are symmetric and normal bilaterally.   DIAGNOSTIC DATA (LABS, IMAGING, TESTING) - I reviewed patient records, labs, notes, testing and imaging myself where available.  Lab Results  Component Value Date   WBC 5.2 08/25/2016   HGB 12.8 06/22/2016   HCT 39.9 08/25/2016   MCV 88 08/25/2016   PLT 255 08/25/2016      Component Value Date/Time   NA 141 08/25/2016 1457   NA 144 10/27/2014 0625   K 5.0 08/25/2016 1457   K 4.2 10/27/2014 0625   K 4.4 08/27/2011 1314   CL 101 08/25/2016 1457   CL 107 10/27/2014 0625   CL 102 08/27/2011 1314   CO2 26 08/25/2016 1457   CO2 29 10/27/2014 0625   CO2 25 08/27/2011 1314   GLUCOSE 86 08/25/2016 1457   GLUCOSE 97 06/22/2016 1816   GLUCOSE 94 10/27/2014 0625   BUN 7 08/25/2016 1457   BUN 10 10/27/2014 0625   CREATININE 0.82 08/25/2016 1457   CREATININE 1.12 (H) 02/19/2016 1543   CALCIUM 9.1 08/25/2016 1457   CALCIUM 8.9 10/27/2014 0625   CALCIUM 9.9 08/27/2011 1314   PROT 6.3 08/25/2016 1457   PROT 5.9 (L) 10/27/2014 0625   ALBUMIN 4.1 08/25/2016 1457   ALBUMIN 3.3 (L) 10/27/2014 0625   AST 18 08/25/2016 1457   AST 16 10/27/2014 0625   AST 11 08/27/2011 1314   ALT 10 08/25/2016 1457   ALT 14 10/27/2014 0625   ALKPHOS 131 (H) 08/25/2016 1457   ALKPHOS 119 (H) 10/27/2014 0625   ALKPHOS 91 08/27/2011 1314   BILITOT <0.2 08/25/2016 1457   BILITOT 0.4 10/27/2014 0625   BILITOT 0.5 08/27/2011 1314   GFRNONAA 81 08/25/2016 1457   GFRNONAA 56 (L) 02/19/2016 1543   GFRAA 94 08/25/2016 1457   GFRAA 65 02/19/2016 1543    Lab Results  Component Value Date   TSH 2.71  09/29/2016      ASSESSMENT AND PLAN 55 y.o. year old female  has a past medical history of Anxiety; Arthritis; Avascular necrosis of hip ([place]); Bipolar 1 disorder ([place]); Chronic left hip pain; Chronic nausea; Depression; Diverticulosis of colon; Family hx of colon cancer; Folate deficiency; GERD (gastroesophageal reflux disease); Headache; Heart murmur; abnormal Pap smear; Hyperlipidemia; Hyperthyroidism; Keratosis, actinic; Low back pain (03/01/2013); Panic attacks; Polysubstance abuse; PTSD (post-traumatic stress disorder); S/P colonoscopy (05/30/2010); S/P endoscopy (05/30/2010); Seizures ([place]); Stroke [facility]) (2014); Tubular adenoma of colon (05/30/2010); and UTI (urinary tract infection). here with:  1. Seizures 2. Bipolar disorder  Overall the patient is doing well. She will continue on Keppra 1000 mg twice a day. Advised that if she has any seizure event she should let us know. Advised that she should not operate a motor vehicle until she is seizure-free for 6 months. Follow-up in 6 months or sooner if needed.    Ward Givens, MSN, NP-C 10/27/2016, 2:38 PM [facility] [address] [place], [place] [address] [phone]

## 2016-10-30 ENCOUNTER — Other Ambulatory Visit: Payer: Self-pay | Admitting: Family Medicine

## 2016-11-04 ENCOUNTER — Ambulatory Visit (INDEPENDENT_AMBULATORY_CARE_PROVIDER_SITE_OTHER): Payer: Medicaid Other | Admitting: Family Medicine

## 2016-11-04 ENCOUNTER — Encounter: Payer: Self-pay | Admitting: Family Medicine

## 2016-11-04 VITALS — BP 130/80 | HR 70 | Temp 97.9°F | Resp 16 | Ht 66.25 in | Wt 174.0 lb

## 2016-11-04 DIAGNOSIS — F419 Anxiety disorder, unspecified: Secondary | ICD-10-CM | POA: Diagnosis not present

## 2016-11-04 DIAGNOSIS — F319 Bipolar disorder, unspecified: Secondary | ICD-10-CM

## 2016-11-04 DIAGNOSIS — F4310 Post-traumatic stress disorder, unspecified: Secondary | ICD-10-CM

## 2016-11-04 NOTE — Progress Notes (Signed)
Subjective:    Patient ID: Sheryl Hall, female    DOB: 1961-01-11, 55 y.o.   MRN: [id number]  HPI6/22/17 Was seen in the emergency room earlier in June after a syncopal episode that seems like it may have been related to orthostatic hypotension. Was diagnosed with a perforated eardrum. She is here today to follow-up. On examination today the right tympanic membrane is healthy, pearly gray, with no erythema or middle ear effusion. In the left external auditory canal there is a peripheral rim of scar tissue.. There appears to be a large central defect. There is no active bleeding. Hearing is reduced in the left ear.  At that time, my plan was: I will consult ENT regarding the perforated eardrum. She has seen Dr. Richardson Landry in the past and thus will consult again as he has removed her previous medical history. She also has bipolar syndrome/disorder. Recently her psychiatrist recommended that she start Tegretol 300 mg by mouth twice a day as a mood stabilizer. She is concerned by taking Lamictal, Tegretol, Keppra, and Klonopin altogether. I recommended that she start the Tegretol but I did suggest that she discontinue the Lamictal as she has not seen any benefit at this. I will recheck the patient back in one month. She no longer on follow-up of her psychiatrist. I will try to manage her as best I can until he can find a different psychiatrist for consultation.  06/16/16 Patient presents today for follow-up.  Patient states that she feels better since discontinuing the medical. However she continues to have symptoms of mania. She reports a racing and intrusive thoughts. She is having terrible nightmares. For instance in one nightmare she was "being held captive by a cult."  She also reports insomnia. She denies any delusional behavior or hallucinations. She denies any depression or suicidal ideation. She is asking for assistance with helping the intrusive thoughts and the insomnia.  At that time, my plan  was: Increase Trileptal to 450 mg at night. Continue 300 mg in the morning. Maximum dose of Trileptal is 1200 mg a day. However, I'm hesitant to push it to the maximum dose given the fact that she is also on Keppra (for seizures) due to potential side effects and sedation. Recheck in 3 weeks  11/04/16 Since last time I saw her, she saw a psychiatrist at [facility]. They started the patient on Vrylar and weaned her off Klonopin from 4 times a day 1 mg to 0.5 mg twice a day. She came back and saw my partner 2 weeks ago stating that she no longer wanted to see the psychiatrist at [facility]. At that time they discontinue Klonopin She also discontinued Vrylar due to side effects including shortness of breath and tremor. Therefore at the present time she is only on Trileptal as a mood stabilizer and lorazepam 1 mg twice a day my partner started at her last visit for anxiety. She also takes trazodone at night to help her sleep. The combination of the lorazepam and the Trileptal has been working well for the patient. This seems to be controlling her anxiety better than any other medication she's been on in the last several years. Past Medical History:  Diagnosis Date  . Anxiety   . Arthritis   . Avascular necrosis of hip ([place])    left  . Bipolar 1 disorder ([place])   . Chronic left hip pain   . Chronic nausea   . Depression   . Diverticulosis of colon   .  Family hx of colon cancer    age 53-father  . Folate deficiency   . GERD (gastroesophageal reflux disease)   . Headache   . Heart murmur   . Hx of abnormal Pap smear   . Hyperlipidemia   . Hyperthyroidism   . Keratosis, actinic   . Low back pain 03/01/2013   MRI with multiple levels of disc bulge  . Panic attacks   . Polysubstance abuse   . PTSD (post-traumatic stress disorder)   . S/P colonoscopy 05/30/2010   LAX sphincter tone, anal papilla, left-sided diverticulosis, normal random biopsies,, 1 polyp-TA  . S/P endoscopy 05/30/2010   Dr Rourk->  non--critical Schatzki's ring, s/p 41F dilation  . Seizures ([place])   . Stroke [facility]) 2014   Right parietal, no deficits   . Tubular adenoma of colon 05/30/2010   Next colonoscopy 05/2015  . UTI (urinary tract infection)    Past Surgical History:  Procedure Laterality Date  . COLONOSCOPY     every 5 years  . COLONOSCOPY  05/30/2010   RMR:lax anal sphincter tone,anal papilla,otherwise normal/left-sided diverticula  . ESOPHAGOGASTRODUODENOSCOPY  05/30/2010   [id number] ring/small HH otherwise normal  . EXTERNAL EAR SURGERY Left 12 years ago   skin graft from behind ear put in ear canal  . JOINT REPLACEMENT    . NECK SURGERY  10 years ago  . SKIN CANCER DESTRUCTION    . 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 TOTAL HIP ARTHROPLASTY Right 05/23/2015   Procedure: RIGHT TOTAL HIP ARTHROPLASTY ANTERIOR APPROACH;  Surgeon: Gaynelle Arabian, MD;  [facility];  Service: Orthopedics;  Laterality: Right;   Current Outpatient Prescriptions on File Prior to Visit  Medication Sig Dispense Refill  . aspirin EC 81 MG tablet Take 81 mg by mouth daily.    . fluticasone (FLONASE) 50 MCG/ACT nasal spray Place 2 sprays into both nostrils daily.    . folic acid (FOLVITE) A999333 MCG tablet Take by mouth daily.     Marland Kitchen levETIRAcetam (KEPPRA) 1000 MG tablet Take 1 tablet (1,000 mg total) by mouth 2 (two) times daily. . 60 tablet 11  . levothyroxine (SYNTHROID, LEVOTHROID) 50 MCG tablet TAKE ONE TABLET BY MOUTH ONCE DAILY. 30 tablet 11  . linaclotide (LINZESS) 72 MCG capsule Take 1 capsule (72 mcg total) by mouth daily before breakfast. 90 capsule 2  . LORazepam (ATIVAN) 1 MG tablet Take 1 tablet (1 mg total) by mouth 2 (two) times daily as needed for anxiety. 60 tablet 1  . omeprazole (PRILOSEC) 40 MG capsule Take 1 capsule (40 mg total) by mouth 2 (two) times  daily. D/c nexium 60 capsule 3  . Oxcarbazepine (TRILEPTAL) 300 MG tablet Take 300 mg by mouth 2 (two) times daily.    . traZODone (DESYREL) 50 MG tablet Take 1 tablet (50 mg total) by mouth at bedtime.    . valACYclovir (VALTREX) 500 MG tablet TAKE ONE TABLET BY MOUTH ONCE DAILY. 30 tablet 0   No current facility-administered medications on file prior to visit.    No Known Allergies Social History   Social History  . Marital status: Divorced    Spouse name: N/A  . Number of children: 1  . Years of education: N/A   Occupational History  . disabled    Social History Main Topics  . Smoking status: Current Some Day  Smoker    Packs/day: 0.25    Years: 30.00    Types: Cigarettes  . Smokeless tobacco: Never Used     Comment: trying to quit  . Alcohol use No  . Drug use: No     Comment: hx cocaine abuse  . Sexual activity: No   Other Topics Concern  . Not on file   Social History Narrative   Patient lives at home alone and she is single.    Disabled.   Education college education.   Right handed.   Caffeine mountain dew four daily.      Review of Systems  All other systems reviewed and are negative.      Objective:   Physical Exam  Constitutional: She appears well-developed and well-nourished.  Cardiovascular: Normal rate, regular rhythm and normal heart sounds.   Pulmonary/Chest: Effort normal and breath sounds normal.  Vitals reviewed.         Assessment & Plan:  Bipolar disorder I spent 30 minutes with the patient discussing her medications. I discussed the risk of habituation taking benzodiazepines on a regular basis. However the patient has tried and failed virtually all SSRIs and acinar eyes and mood stabilizers. Therefore I believe the risk benefit is certainly in favor of continuing lorazepam. I will continue to give her 60 tablets per month. However these must last the entire month. I recommended 1 tablet in the morning and 1 tablet in the evening  maximum. Certainly if she's having a good day she can take less. I encouraged the patient to try to ration the tablets to avoid habituation as long as possible.

## 2016-11-27 ENCOUNTER — Other Ambulatory Visit: Payer: Self-pay | Admitting: Family Medicine

## 2016-12-02 ENCOUNTER — Ambulatory Visit: Payer: Medicaid Other | Admitting: Gastroenterology

## 2016-12-19 ENCOUNTER — Ambulatory Visit (INDEPENDENT_AMBULATORY_CARE_PROVIDER_SITE_OTHER): Payer: Medicaid Other | Admitting: Family Medicine

## 2016-12-19 ENCOUNTER — Encounter: Payer: Self-pay | Admitting: Family Medicine

## 2016-12-19 VITALS — BP 108/64 | HR 60 | Temp 98.2°F | Resp 16 | Ht 66.25 in | Wt 173.0 lb

## 2016-12-19 DIAGNOSIS — F319 Bipolar disorder, unspecified: Secondary | ICD-10-CM

## 2016-12-19 MED ORDER — LORAZEPAM 1 MG PO TABS: 1.0000 mg | ORAL_TABLET | Freq: Two times a day (BID) | ORAL | 1 refills | Status: DC | PRN

## 2016-12-19 MED ORDER — OXCARBAZEPINE 300 MG PO TABS: 300.0000 mg | ORAL_TABLET | Freq: Two times a day (BID) | ORAL | 5 refills | Status: DC

## 2016-12-19 MED ORDER — TRAZODONE HCL 100 MG PO TABS
100.0000 mg | ORAL_TABLET | Freq: Every day | ORAL | 5 refills | Status: DC
Start: 2016-12-19 — End: 2017-06-26

## 2016-12-19 NOTE — Progress Notes (Signed)
Subjective:    Patient ID: Sheryl Hall, female    DOB: 1960/12/29, 56 y.o.   MRN: [id number]  HPI6/22/17 Was seen in the emergency room earlier in June after a syncopal episode that seems like it may have been related to orthostatic hypotension. Was diagnosed with a perforated eardrum. She is here today to follow-up. On examination today the right tympanic membrane is healthy, pearly gray, with no erythema or middle ear effusion. In the left external auditory canal there is a peripheral rim of scar tissue.. There appears to be a large central defect. There is no active bleeding. Hearing is reduced in the left ear.  At that time, my plan was: I will consult ENT regarding the perforated eardrum. She has seen Dr. Richardson Landry in the past and thus will consult again as he has removed her previous medical history. She also has bipolar syndrome/disorder. Recently her psychiatrist recommended that she start Tegretol 300 mg by mouth twice a day as a mood stabilizer. She is concerned by taking Lamictal, Tegretol, Keppra, and Klonopin altogether. I recommended that she start the Tegretol but I did suggest that she discontinue the Lamictal as she has not seen any benefit at this. I will recheck the patient back in one month. She no longer on follow-up of her psychiatrist. I will try to manage her as best I can until he can find a different psychiatrist for consultation.  06/16/16 Patient presents today for follow-up.  Patient states that she feels better since discontinuing the medical. However she continues to have symptoms of mania. She reports a racing and intrusive thoughts. She is having terrible nightmares. For instance in one nightmare she was "being held captive by a cult."  She also reports insomnia. She denies any delusional behavior or hallucinations. She denies any depression or suicidal ideation. She is asking for assistance with helping the intrusive thoughts and the insomnia.  At that time, my plan  was: Increase Trileptal to 450 mg at night. Continue 300 mg in the morning. Maximum dose of Trileptal is 1200 mg a day. However, I'm hesitant to push it to the maximum dose given the fact that she is also on Keppra (for seizures) due to potential side effects and sedation. Recheck in 3 weeks  11/04/16 Since last time I saw her, she saw a psychiatrist at [facility]. They started the patient on Vrylar and weaned her off Klonopin from 4 times a day 1 mg to 0.5 mg twice a day. She came back and saw my partner 2 weeks ago stating that she no longer wanted to see the psychiatrist at [facility]. At that time they discontinue Klonopin She also discontinued Vrylar due to side effects including shortness of breath and tremor. Therefore at the present time she is only on Trileptal as a mood stabilizer and lorazepam 1 mg twice a day my partner started at her last visit for anxiety. She also takes trazodone at night to help her sleep. The combination of the lorazepam and the Trileptal has been working well for the patient. This seems to be controlling her anxiety better than any other medication she's been on in the last several years.  At that time, my plan was: I spent 30 minutes with the patient discussing her medications. I discussed the risk of habituation taking benzodiazepines on a regular basis. However the patient has tried and failed virtually all SSRIs, SNRIs, and mood stabilizers. Therefore I believe the risk benefit is certainly in favor of continuing lorazepam.  I will continue to give her 60 tablets per month. However these must last the entire month. I recommended 1 tablet in the morning and 1 tablet in the evening maximum. Certainly if she's having a good day she can take less. I encouraged the patient to try to ration the tablets to avoid habituation as long as possible. 12/19/16 Overall patient's been doing relatively well. She reports once or twice a week she'll have crying spells where she'll cry for no  reason for several minutes but in between no she is doing very well. She denies any depression. She denies any suicidal ideation. She denies any manic symptoms. She has been able to successfully wean herself down on the Ativan to 1 tablet in the morning and one half tablet in the evening. She is very lonely and isolated. She is unable to drive. She is not working. She is not doing any exercise. She put them a she sits around all day every day. I believe this is contributing quite a bit to her crying spells. She is also recently come to Christmas which is very difficult for her. I believe the season of the year is also affecting her mood Past Medical History:  Diagnosis Date  . Anxiety   . Arthritis   . Avascular necrosis of hip ([place])    left  . Bipolar 1 disorder ([place])   . Chronic left hip pain   . Chronic nausea   . Depression   . Diverticulosis of colon   . Family hx of colon cancer    age 73-father  . Folate deficiency   . GERD (gastroesophageal reflux disease)   . Headache   . Heart murmur   . Hx of abnormal Pap smear   . Hyperlipidemia   . Hyperthyroidism   . Keratosis, actinic   . Low back pain 03/01/2013   MRI with multiple levels of disc bulge  . Panic attacks   . Polysubstance abuse   . PTSD (post-traumatic stress disorder)   . S/P colonoscopy 05/30/2010   LAX sphincter tone, anal papilla, left-sided diverticulosis, normal random biopsies,, 1 polyp-TA  . S/P endoscopy 05/30/2010   Dr Rourk-> non--critical Schatzki's ring, s/p 47F dilation  . Seizures ([place])   . Stroke [facility]) 2014   Right parietal, no deficits   . Tubular adenoma of colon 05/30/2010   Next colonoscopy 05/2015  . UTI (urinary tract infection)    Past Surgical History:  Procedure Laterality Date  . COLONOSCOPY     every 5 years  . COLONOSCOPY  05/30/2010   RMR:lax anal sphincter tone,anal papilla,otherwise normal/left-sided diverticula  . ESOPHAGOGASTRODUODENOSCOPY  05/30/2010   [id number] ring/small HH  otherwise normal  . EXTERNAL EAR SURGERY Left 12 years ago   skin graft from behind ear put in ear canal  . JOINT REPLACEMENT    . NECK SURGERY  10 years ago  . SKIN CANCER DESTRUCTION    . 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 TOTAL HIP ARTHROPLASTY Right 05/23/2015   Procedure: RIGHT TOTAL HIP ARTHROPLASTY ANTERIOR APPROACH;  Surgeon: Gaynelle Arabian, MD;  Location: [facility];  Service: Orthopedics;  Laterality: Right;   Current Outpatient Prescriptions on File Prior to Visit  Medication Sig Dispense Refill  . aspirin EC 81 MG tablet Take 81 mg by mouth daily.    . fluticasone (FLONASE) 50 MCG/ACT  nasal spray Place 2 sprays into both nostrils daily.    . folic acid (FOLVITE) [id number] MCG tablet Take by mouth daily.     Marland Kitchen levETIRAcetam (KEPPRA) 1000 MG tablet Take 1 tablet (1,000 mg total) by mouth 2 (two) times daily. . 60 tablet 11  . levothyroxine (SYNTHROID, LEVOTHROID) 50 MCG tablet TAKE ONE TABLET BY MOUTH ONCE DAILY. 30 tablet 11  . linaclotide (LINZESS) 72 MCG capsule Take 1 capsule (72 mcg total) by mouth daily before breakfast. 90 capsule 2  . LORazepam (ATIVAN) 1 MG tablet Take 1 tablet (1 mg total) by mouth 2 (two) times daily as needed for anxiety. 60 tablet 1  . omeprazole (PRILOSEC) 40 MG capsule Take 1 capsule (40 mg total) by mouth 2 (two) times daily. D/c nexium 60 capsule 3  . Oxcarbazepine (TRILEPTAL) 300 MG tablet Take 300 mg by mouth 2 (two) times daily.    . traZODone (DESYREL) 50 MG tablet Take 1 tablet (50 mg total) by mouth at bedtime.    . valACYclovir (VALTREX) 500 MG tablet TAKE ONE TABLET BY MOUTH ONCE DAILY. 30 tablet 0   No current facility-administered medications on file prior to visit.    No Known Allergies Social History   Social History  . Marital status: Divorced    Spouse name: N/A  . Number of  children: 1  . Years of education: N/A   Occupational History  . disabled    Social History Main Topics  . Smoking status: Current Some Day Smoker    Packs/day: 0.25    Years: 30.00    Types: Cigarettes  . Smokeless tobacco: Never Used     Comment: trying to quit  . Alcohol use No  . Drug use: No     Comment: hx cocaine abuse  . Sexual activity: No   Other Topics Concern  . Not on file   Social History Narrative   Patient lives at home alone and she is single.    Disabled.   Education college education.   Right handed.   Caffeine mountain dew four daily.      Review of Systems  All other systems reviewed and are negative.      Objective:   Physical Exam  Constitutional: She appears well-developed and well-nourished.  Cardiovascular: Normal rate, regular rhythm and normal heart sounds.   Pulmonary/Chest: Effort normal and breath sounds normal.  Vitals reviewed.         Assessment & Plan:  Bipolar disorder At the present time I would not make any changes in her medication. I will like to try to help manage her symptoms to lifestyle changes. I recommended 30 minutes a day 5 days a week of aerobic exercise. I want her to get out of the house and try to be more active. Whether she is walking in the neighborhood, doing yard work, Social research officer, government. I want her to engage in 30 minutes a day of physical activity. I believe the natural endorphins this produces will help her mood and also give her a sense of accomplishment and will help as well rather than just sit isolated inside her house alone every day

## 2016-12-26 ENCOUNTER — Other Ambulatory Visit: Payer: Self-pay | Admitting: Family Medicine

## 2016-12-26 DIAGNOSIS — K219 Gastro-esophageal reflux disease without esophagitis: Secondary | ICD-10-CM

## 2016-12-26 NOTE — Telephone Encounter (Signed)
rx filled per protocol  

## 2017-01-05 ENCOUNTER — Encounter: Payer: Self-pay | Admitting: Family Medicine

## 2017-01-26 ENCOUNTER — Other Ambulatory Visit: Payer: Self-pay | Admitting: Family Medicine

## 2017-02-03 ENCOUNTER — Other Ambulatory Visit: Payer: Self-pay | Admitting: Nurse Practitioner

## 2017-02-03 ENCOUNTER — Ambulatory Visit: Payer: Medicaid Other | Admitting: Family Medicine

## 2017-02-12 ENCOUNTER — Ambulatory Visit (INDEPENDENT_AMBULATORY_CARE_PROVIDER_SITE_OTHER): Payer: Medicaid Other | Admitting: Family Medicine

## 2017-02-12 ENCOUNTER — Encounter: Payer: Self-pay | Admitting: Family Medicine

## 2017-02-12 VITALS — BP 128/80 | HR 84 | Temp 98.0°F | Resp 16 | Ht 66.25 in | Wt 172.0 lb

## 2017-02-12 DIAGNOSIS — F3132 Bipolar disorder, current episode depressed, moderate: Secondary | ICD-10-CM | POA: Diagnosis not present

## 2017-02-12 MED ORDER — OLANZAPINE 5 MG PO TABS: 5.0000 mg | ORAL_TABLET | Freq: Every day | ORAL | 1 refills | Status: DC

## 2017-02-12 NOTE — Progress Notes (Signed)
Subjective:    Patient ID: Sheryl Hall, female    DOB: [birth year], 56 y.o.   MRN: [id number]  HPI  05/15/16 Was seen in the emergency room earlier in June after a syncopal episode that seems like it may have been related to orthostatic hypotension. Was diagnosed with a perforated eardrum. She is here today to follow-up. On examination today the right tympanic membrane is healthy, pearly gray, with no erythema or middle ear effusion. In the left external auditory canal there is a peripheral rim of scar tissue.. There appears to be a large central defect. There is no active bleeding. Hearing is reduced in the left ear.  At that time, my plan was: I will consult ENT regarding the perforated eardrum. She has seen Dr. Richardson Landry in the past and thus will consult again as he has removed her previous medical history. She also has bipolar syndrome/disorder. Recently her psychiatrist recommended that she start Tegretol 300 mg by mouth twice a day as a mood stabilizer. She is concerned by taking Lamictal, Tegretol, Keppra, and Klonopin altogether. I recommended that she start the Tegretol but I did suggest that she discontinue the Lamictal as she has not seen any benefit at this. I will recheck the patient back in one month. She no longer on follow-up of her psychiatrist. I will try to manage her as best I can until he can find a different psychiatrist for consultation.  06/16/16 Patient presents today for follow-up.  Patient states that she feels better since discontinuing the medical. However she continues to have symptoms of mania. She reports a racing and intrusive thoughts. She is having terrible nightmares. For instance in one nightmare she was "being held captive by a cult."  She also reports insomnia. She denies any delusional behavior or hallucinations. She denies any depression or suicidal ideation. She is asking for assistance with helping the intrusive thoughts and the insomnia.  At that time, my plan  was: Increase Trileptal to 450 mg at night. Continue 300 mg in the morning. Maximum dose of Trileptal is 1200 mg a day. However, I'm hesitant to push it to the maximum dose given the fact that she is also on Keppra (for seizures) due to potential side effects and sedation. Recheck in 3 weeks  11/04/16 Since last time I saw her, she saw a psychiatrist at [facility]. They started the patient on Vrylar and weaned her off Klonopin from 4 times a day 1 mg to 0.5 mg twice a day. She came back and saw my partner 2 weeks ago stating that she no longer wanted to see the psychiatrist at [facility]. At that time they discontinue Klonopin She also discontinued Vrylar due to side effects including shortness of breath and tremor. Therefore at the present time she is only on Trileptal as a mood stabilizer and lorazepam 1 mg twice a day my partner started at her last visit for anxiety. She also takes trazodone at night to help her sleep. The combination of the lorazepam and the Trileptal has been working well for the patient. This seems to be controlling her anxiety better than any other medication she's been on in the last several years.  At that time, my plan was: I spent 30 minutes with the patient discussing her medications. I discussed the risk of habituation taking benzodiazepines on a regular basis. However the patient has tried and failed virtually all SSRIs, SNRIs, and mood stabilizers. Therefore I believe the risk benefit is certainly in favor of  continuing lorazepam. I will continue to give her 60 tablets per month. However these must last the entire month. I recommended 1 tablet in the morning and 1 tablet in the evening maximum. Certainly if she's having a good day she can take less. I encouraged the patient to try to ration the tablets to avoid habituation as long as possible. 12/19/16 Overall patient's been doing relatively well. She reports once or twice a week she'll have crying spells where she'll cry for no  reason for several minutes but in between no she is doing very well. She denies any depression. She denies any suicidal ideation. She denies any manic symptoms. She has been able to successfully wean herself down on the Ativan to 1 tablet in the morning and one half tablet in the evening. She is very lonely and isolated. She is unable to drive. She is not working. She is not doing any exercise. She put them a she sits around all day every day. I believe this is contributing quite a bit to her crying spells. She is also recently come to Christmas which is very difficult for her. I believe the season of the year is also affecting her mood  02/12/17 Patient is extremely tearful today. She states that she is very depressed. She reports suicidal thoughts although she has no plan. She is adamant that she would not act on these thoughts. She also reports symptoms of mania including insomnia, racing thoughts. She denies any hallucinations or delusions. Symptoms have been gradually worsening over last 2 or 3 weeks. She also reports a rapid mood swings. She reports anhedonia, poor concentration, poor energy, poor appetite. Past Medical History:  Diagnosis Date  . Anxiety   . Arthritis   . Avascular necrosis of hip ([place])    left  . Bipolar 1 disorder ([place])   . Chronic left hip pain   . Chronic nausea   . Depression   . Diverticulosis of colon   . Family hx of colon cancer    age 31-father  . Folate deficiency   . GERD (gastroesophageal reflux disease)   . Headache   . Heart murmur   . Hx of abnormal Pap smear   . Hyperlipidemia   . Hyperthyroidism   . Keratosis, actinic   . Low back pain 03/01/2013   MRI with multiple levels of disc bulge  . Panic attacks   . Polysubstance abuse   . PTSD (post-traumatic stress disorder)   . S/P colonoscopy 05/30/2010   LAX sphincter tone, anal papilla, left-sided diverticulosis, normal random biopsies,, 1 polyp-TA  . S/P endoscopy 05/30/2010   Dr Rourk-> non--critical  Schatzki's ring, s/p 17F dilation  . Seizures ([place])   . Stroke [facility]) 2014   Right parietal, no deficits   . Tubular adenoma of colon 05/30/2010   Next colonoscopy 05/2015  . UTI (urinary tract infection)    Past Surgical History:  Procedure Laterality Date  . COLONOSCOPY     every 5 years  . COLONOSCOPY  05/30/2010   RMR:lax anal sphincter tone,anal papilla,otherwise normal/left-sided diverticula  . ESOPHAGOGASTRODUODENOSCOPY  05/30/2010   [id number] ring/small HH otherwise normal  . EXTERNAL EAR SURGERY Left 12 years ago   skin graft from behind ear put in ear canal  . JOINT REPLACEMENT    . NECK SURGERY  10 years ago  . SKIN CANCER DESTRUCTION    . TOTAL HIP ARTHROPLASTY Left 04/19/2014   Procedure: LEFT TOTAL HIP ARTHROPLASTY ANTERIOR APPROACH;  Surgeon: Gaynelle Arabian  V, MD;  Location: [facility];  Service: Orthopedics;  Laterality: Left;  . TOTAL HIP ARTHROPLASTY Right 05/23/2015   Procedure: RIGHT TOTAL HIP ARTHROPLASTY ANTERIOR APPROACH;  Surgeon: Gaynelle Arabian, MD;  Location: [facility];  Service: Orthopedics;  Laterality: Right;   Current Outpatient Prescriptions on File Prior to Visit  Medication Sig Dispense Refill  . aspirin EC 81 MG tablet Take 81 mg by mouth daily.    . fluticasone (FLONASE) 50 MCG/ACT nasal spray Place 2 sprays into both nostrils daily.    . folic acid (FOLVITE) 009 MCG tablet Take by mouth daily.     Marland Kitchen levETIRAcetam (KEPPRA) 1000 MG tablet Take 1 tablet (1,000 mg total) by mouth 2 (two) times daily. . 60 tablet 11  . levothyroxine (SYNTHROID, LEVOTHROID) 50 MCG tablet TAKE ONE TABLET BY MOUTH ONCE DAILY. 30 tablet 11  . LINZESS 72 MCG capsule TAKE ONE CAPSULE ORALLY EVERY MORNING BEFORE BREAKFAST. 90 capsule 3  . LORazepam (ATIVAN) 1 MG tablet Take 1 tablet (1 mg total) by mouth 2 (two) times daily as needed for anxiety. 60 tablet 1  . omeprazole (PRILOSEC) 40 MG capsule TAKE (1) CAPSULE BY MOUTH TWICE DAILY. 60 capsule 0  . Oxcarbazepine (TRILEPTAL) 300  MG tablet Take 1 tablet (300 mg total) by mouth 2 (two) times daily. 60 tablet 5  . traZODone (DESYREL) 100 MG tablet Take 1 tablet (100 mg total) by mouth at bedtime. 30 tablet 5  . valACYclovir (VALTREX) 500 MG tablet TAKE ONE TABLET BY MOUTH ONCE DAILY. 30 tablet 5   No current facility-administered medications on file prior to visit.    No Known Allergies Social History   Social History  . Marital status: Divorced    Spouse name: N/A  . Number of children: 1  . Years of education: N/A   Occupational History  . disabled    Social History Main Topics  . Smoking status: Current Some Day Smoker    Packs/day: 0.25    Years: 30.00    Types: Cigarettes  . Smokeless tobacco: Never Used     Comment: trying to quit  . Alcohol use No  . Drug use: No     Comment: hx cocaine abuse  . Sexual activity: No   Other Topics Concern  . Not on file   Social History Narrative   Patient lives at home alone and she is single.    Disabled.   Education college education.   Right handed.   Caffeine mountain dew four daily.      Review of Systems  All other systems reviewed and are negative.      Objective:   Physical Exam  Constitutional: She appears well-developed and well-nourished.  Cardiovascular: Normal rate, regular rhythm and normal heart sounds.   Pulmonary/Chest: Effort normal and breath sounds normal.  Vitals reviewed.         Assessment & Plan:  Bipolar affective disorder, currently depressed, moderate ([place]) - Plan: OLANZapine (ZYPREXA) 5 MG tablet  I believe the patient would benefit from a mood stabilizer geared towards treating severe depression. In the past she has tried Abilify, Herbalist, [place], Seroquel, Depakote, Lamictal, etc.  she refuses to go back to mental health. I will start the patient on Zyprexa 5 mg by mouth daily at bedtime and recheck in one week or sooner if worse. We contracted for safety. I asked the patient to go to mental health immediately  should suicidal ideation return.

## 2017-02-13 ENCOUNTER — Other Ambulatory Visit: Payer: Self-pay | Admitting: Family Medicine

## 2017-02-13 DIAGNOSIS — K219 Gastro-esophageal reflux disease without esophagitis: Secondary | ICD-10-CM

## 2017-02-13 NOTE — Telephone Encounter (Signed)
Medication refilled per protocol. 

## 2017-02-19 ENCOUNTER — Ambulatory Visit (INDEPENDENT_AMBULATORY_CARE_PROVIDER_SITE_OTHER): Payer: Medicaid Other | Admitting: Family Medicine

## 2017-02-19 VITALS — BP 118/68 | HR 86 | Temp 98.2°F | Resp 18 | Ht 66.25 in | Wt 174.0 lb

## 2017-02-19 DIAGNOSIS — F3132 Bipolar disorder, current episode depressed, moderate: Secondary | ICD-10-CM

## 2017-02-19 NOTE — Progress Notes (Signed)
Subjective:    Patient ID: Sheryl Hall, female    DOB: [birth year], 56 y.o.   MRN: [id number]  HPI  05/15/16 Was seen in the emergency room earlier in June after a syncopal episode that seems like it may have been related to orthostatic hypotension. Was diagnosed with a perforated eardrum. She is here today to follow-up. On examination today the right tympanic membrane is healthy, pearly gray, with no erythema or middle ear effusion. In the left external auditory canal there is a peripheral rim of scar tissue.. There appears to be a large central defect. There is no active bleeding. Hearing is reduced in the left ear.  At that time, my plan was: I will consult ENT regarding the perforated eardrum. She has seen Dr. Richardson Landry in the past and thus will consult again as he has removed her previous medical history. She also has bipolar syndrome/disorder. Recently her psychiatrist recommended that she start Tegretol 300 mg by mouth twice a day as a mood stabilizer. She is concerned by taking Lamictal, Tegretol, Keppra, and Klonopin altogether. I recommended that she start the Tegretol but I did suggest that she discontinue the Lamictal as she has not seen any benefit at this. I will recheck the patient back in one month. She no longer on follow-up of her psychiatrist. I will try to manage her as best I can until he can find a different psychiatrist for consultation.  06/16/16 Patient presents today for follow-up.  Patient states that she feels better since discontinuing the medical. However she continues to have symptoms of mania. She reports a racing and intrusive thoughts. She is having terrible nightmares. For instance in one nightmare she was "being held captive by a cult."  She also reports insomnia. She denies any delusional behavior or hallucinations. She denies any depression or suicidal ideation. She is asking for assistance with helping the intrusive thoughts and the insomnia.  At that time, my plan  was: Increase Trileptal to 450 mg at night. Continue 300 mg in the morning. Maximum dose of Trileptal is 1200 mg a day. However, I'm hesitant to push it to the maximum dose given the fact that she is also on Keppra (for seizures) due to potential side effects and sedation. Recheck in 3 weeks  11/04/16 Since last time I saw her, she saw a psychiatrist at [facility]. They started the patient on Vrylar and weaned her off Klonopin from 4 times a day 1 mg to 0.5 mg twice a day. She came back and saw my partner 2 weeks ago stating that she no longer wanted to see the psychiatrist at [facility]. At that time they discontinue Klonopin She also discontinued Vrylar due to side effects including shortness of breath and tremor. Therefore at the present time she is only on Trileptal as a mood stabilizer and lorazepam 1 mg twice a day my partner started at her last visit for anxiety. She also takes trazodone at night to help her sleep. The combination of the lorazepam and the Trileptal has been working well for the patient. This seems to be controlling her anxiety better than any other medication she's been on in the last several years.  At that time, my plan was: I spent 30 minutes with the patient discussing her medications. I discussed the risk of habituation taking benzodiazepines on a regular basis. However the patient has tried and failed virtually all SSRIs, SNRIs, and mood stabilizers. Therefore I believe the risk benefit is certainly in favor of  continuing lorazepam. I will continue to give her 60 tablets per month. However these must last the entire month. I recommended 1 tablet in the morning and 1 tablet in the evening maximum. Certainly if she's having a good day she can take less. I encouraged the patient to try to ration the tablets to avoid habituation as long as possible. 12/19/16 Overall patient's been doing relatively well. She reports once or twice a week she'll have crying spells where she'll cry for no  reason for several minutes but in between no she is doing very well. She denies any depression. She denies any suicidal ideation. She denies any manic symptoms. She has been able to successfully wean herself down on the Ativan to 1 tablet in the morning and one half tablet in the evening. She is very lonely and isolated. She is unable to drive. She is not working. She is not doing any exercise. She put them a she sits around all day every day. I believe this is contributing quite a bit to her crying spells. She is also recently come to Christmas which is very difficult for her. I believe the season of the year is also affecting her mood  02/12/17 Patient is extremely tearful today. She states that she is very depressed. She reports suicidal thoughts although she has no plan. She is adamant that she would not act on these thoughts. She also reports symptoms of mania including insomnia, racing thoughts. She denies any hallucinations or delusions. Symptoms have been gradually worsening over last 2 or 3 weeks. She also reports a rapid mood swings. She reports anhedonia, poor concentration, poor energy, poor appetite.  At that time, my plan was: I believe the patient would benefit from a mood stabilizer geared towards treating severe depression. In the past she has tried Abilify, Herbalist, [place], Seroquel, Depakote, Lamictal, etc.  she refuses to go back to mental health. I will start the patient on Zyprexa 5 mg by mouth daily at bedtime and recheck in one week or sooner if worse. We contracted for safety. I asked the patient to go to mental health immediately should suicidal ideation return.  02/19/17 For the first time in a long time, the patient states that she feels better. She states that she feels more motivated to leave the home. She's not just laying around crying all day. She actually states that her depression has improved even after only just one week. Her anhedonia has improved. Her insomnia is better.  She starting to exercise more. She denies any suicidal thoughts. She denies any mania or racing thoughts at the present time even though she was endorsing some of these at her last visit. She denies any hallucinations or delusional thoughts. Her biggest concern is she started to gain weight on the medicine. Her weight is up 2 pounds from her last visit even though she is only on 5 mg a day Past Medical History:  Diagnosis Date  . Anxiety   . Arthritis   . Avascular necrosis of hip ([place])    left  . Bipolar 1 disorder ([place])   . Chronic left hip pain   . Chronic nausea   . Depression   . Diverticulosis of colon   . Family hx of colon cancer    age 73-father  . Folate deficiency   . GERD (gastroesophageal reflux disease)   . Headache   . Heart murmur   . Hx of abnormal Pap smear   . Hyperlipidemia   .  Hyperthyroidism   . Keratosis, actinic   . Low back pain 03/01/2013   MRI with multiple levels of disc bulge  . Panic attacks   . Polysubstance abuse   . PTSD (post-traumatic stress disorder)   . S/P colonoscopy 05/30/2010   LAX sphincter tone, anal papilla, left-sided diverticulosis, normal random biopsies,, 1 polyp-TA  . S/P endoscopy 05/30/2010   Dr Rourk-> non--critical Schatzki's ring, s/p 14F dilation  . Seizures ([place])   . Stroke [facility]) 2014   Right parietal, no deficits   . Tubular adenoma of colon 05/30/2010   Next colonoscopy 05/2015  . UTI (urinary tract infection)    Past Surgical History:  Procedure Laterality Date  . COLONOSCOPY     every 5 years  . COLONOSCOPY  05/30/2010   RMR:lax anal sphincter tone,anal papilla,otherwise normal/left-sided diverticula  . ESOPHAGOGASTRODUODENOSCOPY  05/30/2010   HAL:[id number] ring/small HH otherwise normal  . EXTERNAL EAR SURGERY Left 12 years ago   skin graft from behind ear put in ear canal  . JOINT REPLACEMENT    . NECK SURGERY  10 years ago  . SKIN CANCER DESTRUCTION    . 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 TOTAL HIP ARTHROPLASTY Right 05/23/2015   Procedure: RIGHT TOTAL HIP ARTHROPLASTY ANTERIOR APPROACH;  Surgeon: Gaynelle Arabian, MD;  Location: [facility];  Service: Orthopedics;  Laterality: Right;   Current Outpatient Prescriptions on File Prior to Visit  Medication Sig Dispense Refill  . aspirin EC 81 MG tablet Take 81 mg by mouth daily.    . fluticasone (FLONASE) 50 MCG/ACT nasal spray Place 2 sprays into both nostrils daily.    . folic acid (FOLVITE) 329 MCG tablet Take by mouth daily.     Marland Kitchen levETIRAcetam (KEPPRA) 1000 MG tablet Take 1 tablet (1,000 mg total) by mouth 2 (two) times daily. . 60 tablet 11  . levothyroxine (SYNTHROID, LEVOTHROID) 50 MCG tablet TAKE ONE TABLET BY MOUTH ONCE DAILY. 30 tablet 11  . LINZESS 72 MCG capsule TAKE ONE CAPSULE ORALLY EVERY MORNING BEFORE BREAKFAST. 90 capsule 3  . LORazepam (ATIVAN) 1 MG tablet Take 1 tablet (1 mg total) by mouth 2 (two) times daily as needed for anxiety. 60 tablet 1  . OLANZapine (ZYPREXA) 5 MG tablet Take 1 tablet (5 mg total) by mouth at bedtime. 30 tablet 1  . omeprazole (PRILOSEC) 40 MG capsule TAKE (1) CAPSULE BY MOUTH TWICE DAILY. 60 capsule 5  . Oxcarbazepine (TRILEPTAL) 300 MG tablet Take 1 tablet (300 mg total) by mouth 2 (two) times daily. 60 tablet 5  . traZODone (DESYREL) 100 MG tablet Take 1 tablet (100 mg total) by mouth at bedtime. 30 tablet 5  . valACYclovir (VALTREX) 500 MG tablet TAKE ONE TABLET BY MOUTH ONCE DAILY. 30 tablet 5   No current facility-administered medications on file prior to visit.    No Known Allergies Social History   Social History  . Marital status: Divorced    Spouse name: N/A  . Number of children: 1  . Years of education: N/A   Occupational History  . disabled    Social History Main Topics  . Smoking status: Current Some Day Smoker    Packs/day:  0.25    Years: 30.00    Types: Cigarettes  . Smokeless tobacco: Never Used     Comment: trying to quit  .  Alcohol use No  . Drug use: No     Comment: hx cocaine abuse  . Sexual activity: No   Other Topics Concern  . Not on file   Social History Narrative   Patient lives at home alone and she is single.    Disabled.   Education college education.   Right handed.   Caffeine mountain dew four daily.      Review of Systems  All other systems reviewed and are negative.      Objective:   Physical Exam  Constitutional: She appears well-developed and well-nourished.  Cardiovascular: Normal rate, regular rhythm and normal heart sounds.   Pulmonary/Chest: Effort normal and breath sounds normal.  Vitals reviewed.         Assessment & Plan:  Bipolar disorder I would leave Zyprexa at 5 mg a day. This patient has been suffering with depression off and on her entire life. Most visits that I have had with this patient in the last year have been spent discussing how poorly she feels. For the first time that I can remember she is actually smiling and seems to be in a better mood. Therefore I would leave the medication alone and we discussed strategies to help prevent weight gain including scheduling 30 minutes a day of regular aerobic exercise, emptying the house of clinically dense food. Strategies to prevent overeating. One month

## 2017-02-23 ENCOUNTER — Ambulatory Visit (INDEPENDENT_AMBULATORY_CARE_PROVIDER_SITE_OTHER): Payer: Medicaid Other | Admitting: Neurology

## 2017-02-23 ENCOUNTER — Encounter: Payer: Self-pay | Admitting: Neurology

## 2017-02-23 VITALS — BP 129/74 | HR 71 | Resp 16 | Ht 66.25 in | Wt 175.0 lb

## 2017-02-23 DIAGNOSIS — G40209 Localization-related (focal) (partial) symptomatic epilepsy and epileptic syndromes with complex partial seizures, not intractable, without status epilepticus: Secondary | ICD-10-CM | POA: Diagnosis not present

## 2017-02-23 NOTE — Progress Notes (Signed)
Reason for visit: Seizures  Sheryl Hall is an 56 y.o. female  History of present illness:  Sheryl Hall is a 56 year old right-handed white female with a history of seizures. The patient is on Keppra taking 1000 mg twice daily, she does report some daytime drowsiness but she indicates as well that she does not sleep at night frequently. The patient takes trazodone for sleep at night. The patient is also on Trileptal for her bipolar disorder taking 300 mg twice daily. She claims that her anxiety issues have been under better control. She has not had any further seizure-type events since last seen. The patient returns for an evaluation.  Past Medical History:  Diagnosis Date  . Anxiety   . Arthritis   . Avascular necrosis of hip ([place])    left  . Bipolar 1 disorder ([place])   . Chronic left hip pain   . Chronic nausea   . Depression   . Diverticulosis of colon   . Family hx of colon cancer    age 71-father  . Folate deficiency   . GERD (gastroesophageal reflux disease)   . Headache   . Heart murmur   . Hx of abnormal Pap smear   . Hyperlipidemia   . Hyperthyroidism   . Keratosis, actinic   . Low back pain 03/01/2013   MRI with multiple levels of disc bulge  . Panic attacks   . Polysubstance abuse   . PTSD (post-traumatic stress disorder)   . S/P colonoscopy 05/30/2010   LAX sphincter tone, anal papilla, left-sided diverticulosis, normal random biopsies,, 1 polyp-TA  . S/P endoscopy 05/30/2010   Dr Rourk-> non--critical Schatzki's ring, s/p 105F dilation  . Seizures ([place])   . Stroke [facility]) 2014   Right parietal, no deficits   . Tubular adenoma of colon 05/30/2010   Next colonoscopy 05/2015  . UTI (urinary tract infection)     Past Surgical History:  Procedure Laterality Date  . COLONOSCOPY     every 5 years  . COLONOSCOPY  05/30/2010   RMR:lax anal sphincter tone,anal papilla,otherwise normal/left-sided diverticula  . ESOPHAGOGASTRODUODENOSCOPY  05/30/2010   FAO:[id number]'G  ring/small HH otherwise normal  . EXTERNAL EAR SURGERY Left 12 years ago   skin graft from behind ear put in ear canal  . JOINT REPLACEMENT    . NECK SURGERY  10 years ago  . SKIN CANCER DESTRUCTION    . 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 TOTAL HIP ARTHROPLASTY Right 05/23/2015   Procedure: RIGHT TOTAL HIP ARTHROPLASTY ANTERIOR APPROACH;  Surgeon: Gaynelle Arabian, MD;  Location: [facility];  Service: Orthopedics;  Laterality: Right;    Family History  Problem Relation Age of Onset  . Diabetes Mother   . Hypertension Mother   . Colon cancer Father 27    living   . Hypertension Father   . Hypertension Sister   . Heart attack Brother   . Seizures Maternal Aunt     Social history:  reports that she has been smoking Cigarettes.  She has a 7.50 pack-year smoking history. She has never used smokeless tobacco. She reports that she does not drink alcohol or use drugs.   No Known Allergies  Medications:  Prior to Admission medications   Medication Sig Start Date End Date Taking? Authorizing Provider  aspirin EC 81 MG tablet Take 81 mg  by mouth daily.   Yes Historical Provider, MD  fluticasone (FLONASE) 50 MCG/ACT nasal spray Place 2 sprays into both nostrils daily.   Yes Historical Provider, MD  folic acid (FOLVITE) 967 MCG tablet Take by mouth daily.    Yes Historical Provider, MD  levETIRAcetam (KEPPRA) 1000 MG tablet Take 1 tablet (1,000 mg total) by mouth 2 (two) times daily. . 08/25/16  Yes Ward Givens, NP  levothyroxine (SYNTHROID, LEVOTHROID) 50 MCG tablet TAKE ONE TABLET BY MOUTH ONCE DAILY. 10/20/16  Yes Susy Frizzle, MD  LINZESS 72 MCG capsule TAKE ONE CAPSULE ORALLY EVERY MORNING BEFORE BREAKFAST. 02/03/17  Yes Annitta Needs, NP  LORazepam (ATIVAN) 1 MG tablet Take 1 tablet (1 mg total) by mouth 2 (two) times daily as needed  for anxiety. 12/19/16  Yes Susy Frizzle, MD  OLANZapine (ZYPREXA) 5 MG tablet Take 1 tablet (5 mg total) by mouth at bedtime. 02/12/17  Yes Susy Frizzle, MD  omeprazole (PRILOSEC) 40 MG capsule TAKE (1) CAPSULE BY MOUTH TWICE DAILY. 02/13/17  Yes Susy Frizzle, MD  Oxcarbazepine (TRILEPTAL) 300 MG tablet Take 1 tablet (300 mg total) by mouth 2 (two) times daily. 12/19/16  Yes Susy Frizzle, MD  traZODone (DESYREL) 100 MG tablet Take 1 tablet (100 mg total) by mouth at bedtime. 12/19/16  Yes Susy Frizzle, MD  valACYclovir (VALTREX) 500 MG tablet TAKE ONE TABLET BY MOUTH ONCE DAILY. 01/26/17  Yes Susy Frizzle, MD    ROS:  Out of a complete 14 system review of symptoms, the patient complains only of the following symptoms, and all other reviewed systems are negative.  Anxiety  Blood pressure 129/74, pulse 71, resp. rate 16, height 5' 6.25" (1.683 m), weight 175 lb (79.4 kg).  Physical Exam  General: The patient is alert and cooperative at the time of the examination.  Skin: No significant peripheral edema is noted.   Neurologic Exam  Mental status: The patient is alert and oriented x 3 at the time of the examination. The patient has apparent normal recent and remote memory, with an apparently normal attention span and concentration ability.   Cranial nerves: Facial symmetry is present. Speech is normal, no aphasia or dysarthria is noted. Extraocular movements are full. Visual fields are full.  Motor: The patient has good strength in all 4 extremities.  Sensory examination: Soft touch sensation is symmetric on the face, arms, and legs.  Coordination: The patient has good finger-nose-finger and heel-to-shin bilaterally.  Gait and station: The patient has a normal gait. Tandem gait is normal. Romberg is negative. No drift is seen.  Reflexes: Deep tendon reflexes are symmetric.   Assessment/Plan:  1. History of seizures  2. Bipolar disorder  3. Anxiety  disorder  The patient is doing relatively well at this time. She is having some drowsiness during the day, but she cannot sleep well at night. She will begin taking Keppra 500 mg in the morning and 1500 mg in the evening. She will follow-up in 6 months, she may return to driving if she is not having any seizure-type events within the last 6 months.  Jill Alexanders MD 02/23/2017 2:57 PM  [facility] [address] [place], [place] [address]  Phone [phone] Fax [phone]

## 2017-02-25 ENCOUNTER — Other Ambulatory Visit: Payer: Self-pay | Admitting: Family Medicine

## 2017-02-25 NOTE — Telephone Encounter (Signed)
LRF 12/19/16 #60 + 1  LOV 02/19/17  OK refill?

## 2017-02-26 NOTE — Telephone Encounter (Signed)
ok 

## 2017-02-27 NOTE — Telephone Encounter (Signed)
Rx called in 

## 2017-03-13 ENCOUNTER — Telehealth: Payer: Self-pay | Admitting: Family Medicine

## 2017-03-13 ENCOUNTER — Other Ambulatory Visit: Payer: Self-pay | Admitting: Family Medicine

## 2017-03-13 DIAGNOSIS — F3132 Bipolar disorder, current episode depressed, moderate: Secondary | ICD-10-CM

## 2017-03-13 MED ORDER — OLANZAPINE 5 MG PO TABS: 2.5000 mg | ORAL_TABLET | Freq: Every day | ORAL | 2 refills | Status: DC

## 2017-03-13 NOTE — Telephone Encounter (Signed)
Ok zyprexa 2.5 mg tabs poqhs.

## 2017-03-13 NOTE — Telephone Encounter (Signed)
Pt called and states that she has been having to cut the Zyprexa in half and they do not split well and was wondering if we could send in for the 2.5mg  so she does not have to cut it in half?

## 2017-03-13 NOTE — Telephone Encounter (Signed)
Called and spoke to pt and the pharmacy cut the pills in half for her. She is taking 2.5 mg qd.

## 2017-03-23 ENCOUNTER — Ambulatory Visit: Payer: Medicaid Other | Admitting: Family Medicine

## 2017-04-10 ENCOUNTER — Ambulatory Visit: Payer: Medicaid Other | Admitting: Family Medicine

## 2017-04-16 ENCOUNTER — Other Ambulatory Visit: Payer: Self-pay | Admitting: Family Medicine

## 2017-04-16 NOTE — Telephone Encounter (Signed)
Ok to refill 

## 2017-04-17 NOTE — Telephone Encounter (Signed)
Medication called to pharmacy. 

## 2017-04-17 NOTE — Telephone Encounter (Signed)
ok 

## 2017-04-30 ENCOUNTER — Encounter: Payer: Self-pay | Admitting: Internal Medicine

## 2017-05-08 ENCOUNTER — Encounter: Payer: Self-pay | Admitting: Family Medicine

## 2017-05-08 ENCOUNTER — Ambulatory Visit (INDEPENDENT_AMBULATORY_CARE_PROVIDER_SITE_OTHER): Payer: Medicaid Other | Admitting: Family Medicine

## 2017-05-08 VITALS — BP 128/72 | HR 68 | Temp 98.5°F | Resp 18 | Ht 66.25 in | Wt 175.0 lb

## 2017-05-08 DIAGNOSIS — F3132 Bipolar disorder, current episode depressed, moderate: Secondary | ICD-10-CM | POA: Diagnosis not present

## 2017-05-08 MED ORDER — FLUOXETINE HCL 20 MG PO TABS: 20.0000 mg | ORAL_TABLET | Freq: Every day | ORAL | 3 refills | Status: DC

## 2017-05-08 NOTE — Progress Notes (Signed)
Subjective:    Patient ID: Sheryl Hall, female    DOB: February 10, 1961, 55 y.o.   MRN: [id number]  HPI  05/15/16 Was seen in the emergency room earlier in June after a syncopal episode that seems like it may have been related to orthostatic hypotension. Was diagnosed with a perforated eardrum. She is here today to follow-up. On examination today the right tympanic membrane is healthy, pearly gray, with no erythema or middle ear effusion. In the left external auditory canal there is a peripheral rim of scar tissue.. There appears to be a large central defect. There is no active bleeding. Hearing is reduced in the left ear.  At that time, my plan was: I will consult ENT regarding the perforated eardrum. She has seen Dr. Richardson Landry in the past and thus will consult again as he has removed her previous medical history. She also has bipolar syndrome/disorder. Recently her psychiatrist recommended that she start Tegretol 300 mg by mouth twice a day as a mood stabilizer. She is concerned by taking Lamictal, Tegretol, Keppra, and Klonopin altogether. I recommended that she start the Tegretol but I did suggest that she discontinue the Lamictal as she has not seen any benefit at this. I will recheck the patient back in one month. She no longer on follow-up of her psychiatrist. I will try to manage her as best I can until he can find a different psychiatrist for consultation.  06/16/16 Patient presents today for follow-up.  Patient states that she feels better since discontinuing the medical. However she continues to have symptoms of mania. She reports a racing and intrusive thoughts. She is having terrible nightmares. For instance in one nightmare she was "being held captive by a cult."  She also reports insomnia. She denies any delusional behavior or hallucinations. She denies any depression or suicidal ideation. She is asking for assistance with helping the intrusive thoughts and the insomnia.  At that time, my plan  was: Increase Trileptal to 450 mg at night. Continue 300 mg in the morning. Maximum dose of Trileptal is 1200 mg a day. However, I'm hesitant to push it to the maximum dose given the fact that she is also on Keppra (for seizures) due to potential side effects and sedation. Recheck in 3 weeks  11/04/16 Since last time I saw her, she saw a psychiatrist at [facility]. They started the patient on Vrylar and weaned her off Klonopin from 4 times a day 1 mg to 0.5 mg twice a day. She came back and saw my partner 2 weeks ago stating that she no longer wanted to see the psychiatrist at [facility]. At that time they discontinue Klonopin She also discontinued Vrylar due to side effects including shortness of breath and tremor. Therefore at the present time she is only on Trileptal as a mood stabilizer and lorazepam 1 mg twice a day my partner started at her last visit for anxiety. She also takes trazodone at night to help her sleep. The combination of the lorazepam and the Trileptal has been working well for the patient. This seems to be controlling her anxiety better than any other medication she's been on in the last several years.  At that time, my plan was: I spent 30 minutes with the patient discussing her medications. I discussed the risk of habituation taking benzodiazepines on a regular basis. However the patient has tried and failed virtually all SSRIs, SNRIs, and mood stabilizers. Therefore I believe the risk benefit is certainly in favor of  continuing lorazepam. I will continue to give her 60 tablets per month. However these must last the entire month. I recommended 1 tablet in the morning and 1 tablet in the evening maximum. Certainly if she's having a good day she can take less. I encouraged the patient to try to ration the tablets to avoid habituation as long as possible. 12/19/16 Overall patient's been doing relatively well. She reports once or twice a week she'll have crying spells where she'll cry for no  reason for several minutes but in between no she is doing very well. She denies any depression. She denies any suicidal ideation. She denies any manic symptoms. She has been able to successfully wean herself down on the Ativan to 1 tablet in the morning and one half tablet in the evening. She is very lonely and isolated. She is unable to drive. She is not working. She is not doing any exercise. She put them a she sits around all day every day. I believe this is contributing quite a bit to her crying spells. She is also recently come to Christmas which is very difficult for her. I believe the season of the year is also affecting her mood  02/12/17 Patient is extremely tearful today. She states that she is very depressed. She reports suicidal thoughts although she has no plan. She is adamant that she would not act on these thoughts. She also reports symptoms of mania including insomnia, racing thoughts. She denies any hallucinations or delusions. Symptoms have been gradually worsening over last 2 or 3 weeks. She also reports a rapid mood swings. She reports anhedonia, poor concentration, poor energy, poor appetite.  At that time, my plan was: I believe the patient would benefit from a mood stabilizer geared towards treating severe depression. In the past she has tried Abilify, Herbalist, [place], Seroquel, Depakote, Lamictal, etc.  she refuses to go back to mental health. I will start the patient on Zyprexa 5 mg by mouth daily at bedtime and recheck in one week or sooner if worse. We contracted for safety. I asked the patient to go to mental health immediately should suicidal ideation return.  02/19/17 For the first time in a long time, the patient states that she feels better. She states that she feels more motivated to leave the home. She's not just laying around crying all day. She actually states that her depression has improved even after only just one week. Her anhedonia has improved. Her insomnia is better.  She starting to exercise more. She denies any suicidal thoughts. She denies any mania or racing thoughts at the present time even though she was endorsing some of these at her last visit. She denies any hallucinations or delusional thoughts. Her biggest concern is she started to gain weight on the medicine. Her weight is up 2 pounds from her last visit even though she is only on 5 mg a day.  At that time, my plan was: I would leave Zyprexa at 5 mg a day. This patient has been suffering with depression off and on her entire life. Most visits that I have had with this patient in the last year have been spent discussing how poorly she feels. For the first time that I can remember she is actually smiling and seems to be in a better mood. Therefore I would leave the medication alone and we discussed strategies to help prevent weight gain including scheduling 30 minutes a day of regular aerobic exercise, emptying the house of clinically  dense food. Strategies to prevent overeating.   05/08/17 Wt Readings from Last 3 Encounters:  05/08/17 175 lb (79.4 kg)  02/23/17 175 lb (79.4 kg)  02/19/17 174 lb (78.9 kg)   Patient states that she's been battling depression. Her aunt was recently diagnosed with metastatic rectal cancer and has been placed on hospice. She has intrusive thoughts of overwhelming sadness and anhedonia. She's been having panic attacks recently. She denies any suicidal ideation. She denies any hallucinations. She denies any auditory hallucinations. She denies any delusional thoughts. She denies any symptoms of mania. She denies any insomnia. She denies any racing thoughts. She denies any increased energy or impulsive behavior. Her biggest concern is overwhelming depression.  Past Medical History:  Diagnosis Date  . Anxiety   . Arthritis   . Avascular necrosis of hip ([place])    left  . Bipolar 1 disorder ([place])   . Chronic left hip pain   . Chronic nausea   . Depression   . Diverticulosis of  colon   . Family hx of colon cancer    age 16-father  . Folate deficiency   . GERD (gastroesophageal reflux disease)   . Headache   . Heart murmur   . Hx of abnormal Pap smear   . Hyperlipidemia   . Hyperthyroidism   . Keratosis, actinic   . Low back pain 03/01/2013   MRI with multiple levels of disc bulge  . Panic attacks   . Polysubstance abuse   . PTSD (post-traumatic stress disorder)   . S/P colonoscopy 05/30/2010   LAX sphincter tone, anal papilla, left-sided diverticulosis, normal random biopsies,, 1 polyp-TA  . S/P endoscopy 05/30/2010   Dr Rourk-> non--critical Schatzki's ring, s/p 63F dilation  . Seizures ([place])   . Stroke [facility]) 2014   Right parietal, no deficits   . Tubular adenoma of colon 05/30/2010   Next colonoscopy 05/2015  . UTI (urinary tract infection)    Past Surgical History:  Procedure Laterality Date  . COLONOSCOPY     every 5 years  . COLONOSCOPY  05/30/2010   RMR:lax anal sphincter tone,anal papilla,otherwise normal/left-sided diverticula  . ESOPHAGOGASTRODUODENOSCOPY  05/30/2010   DDU:[id number] ring/small HH otherwise normal  . EXTERNAL EAR SURGERY Left 12 years ago   skin graft from behind ear put in ear canal  . JOINT REPLACEMENT    . NECK SURGERY  10 years ago  . SKIN CANCER DESTRUCTION    . 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 TOTAL HIP ARTHROPLASTY Right 05/23/2015   Procedure: RIGHT TOTAL HIP ARTHROPLASTY ANTERIOR APPROACH;  Surgeon: Gaynelle Arabian, MD;  Location: [facility];  Service: Orthopedics;  Laterality: Right;   Current Outpatient Prescriptions on File Prior to Visit  Medication Sig Dispense Refill  . aspirin EC 81 MG tablet Take 81 mg by mouth daily.    . fluticasone (FLONASE) 50 MCG/ACT nasal spray Place 2 sprays into both nostrils daily.    . folic acid (FOLVITE) 628 MCG tablet Take by  mouth daily.     Marland Kitchen levETIRAcetam (KEPPRA) 1000 MG tablet Take 1 tablet (1,000 mg total) by mouth 2 (two) times daily. . 60 tablet 11  . levothyroxine (SYNTHROID, LEVOTHROID) 50 MCG tablet TAKE ONE TABLET BY MOUTH ONCE DAILY. 30 tablet 11  . LINZESS 72 MCG capsule TAKE ONE CAPSULE ORALLY EVERY MORNING BEFORE BREAKFAST. [place]  capsule 3  . LORazepam (ATIVAN) 1 MG tablet TAKE (1) TABLET BY MOUTH TWICE DAILY AS NEEDED FOR ANXIETY. 60 tablet 0  . OLANZapine (ZYPREXA) 5 MG tablet Take 0.5 tablets (2.5 mg total) by mouth at bedtime. 30 tablet 2  . omeprazole (PRILOSEC) 40 MG capsule TAKE (1) CAPSULE BY MOUTH TWICE DAILY. 60 capsule 5  . Oxcarbazepine (TRILEPTAL) 300 MG tablet Take 1 tablet (300 mg total) by mouth 2 (two) times daily. 60 tablet 5  . traZODone (DESYREL) 100 MG tablet Take 1 tablet (100 mg total) by mouth at bedtime. 30 tablet 5  . valACYclovir (VALTREX) 500 MG tablet TAKE ONE TABLET BY MOUTH ONCE DAILY. 30 tablet 5   No current facility-administered medications on file prior to visit.    No Known Allergies Social History   Social History  . Marital status: Divorced    Spouse name: N/A  . Number of children: 1  . Years of education: N/A   Occupational History  . disabled    Social History Main Topics  . Smoking status: Current Every Day Smoker    Packs/day: 0.25    Years: 30.00    Types: Cigarettes  . Smokeless tobacco: Never Used     Comment: trying to quit  . Alcohol use No  . Drug use: No     Comment: hx cocaine abuse  . Sexual activity: No   Other Topics Concern  . Not on file   Social History Narrative   Patient lives at home alone and she is single.    Disabled.   Education college education.   Right handed.   Caffeine mountain dew four daily.      Review of Systems  All other systems reviewed and are negative.      Objective:   Physical Exam  Constitutional: She appears well-developed and well-nourished.  Cardiovascular: Normal rate, regular rhythm  and normal heart sounds.   Pulmonary/Chest: Effort normal and breath sounds normal.  Vitals reviewed.         Assessment & Plan:  Bipolar affective disorder, currently depressed, moderate ([place]) - Plan: Ambulatory referral to Psychology  I believe the patient would mainly benefit from counseling. I think cognitive behavioral therapy would be quite beneficial to this patient. I will augment the Zyprexa with Prozac 20 mg a day. She does not want to increase the Zyprexa due to the risk of weight gain and I do not believe that she will garner increase benefit by increasing Trileptal as this is mainly a mood stabilizer. At present I feel that her bipolar regimen is not addressing her depression well enough. Reassess in 6 weeks and consult psychology.

## 2017-05-25 ENCOUNTER — Telehealth: Payer: Self-pay | Admitting: Family Medicine

## 2017-05-25 NOTE — Telephone Encounter (Signed)
Pt called [facility] stating that she can not take the Prozac that she is having a lot of irritability and anger issues. She would like to stop this medication and needs recommendations for another one you may suggest.  CB# [phone]

## 2017-05-26 MED ORDER — OLANZAPINE 5 MG PO TABS
5.0000 mg | ORAL_TABLET | Freq: Every day | ORAL | 2 refills | Status: DC
Start: 2017-05-26 — End: 2017-06-02

## 2017-05-26 NOTE — Telephone Encounter (Signed)
Patient aware of providers recommendations.  

## 2017-05-26 NOTE — Telephone Encounter (Signed)
Hold the prozac and see if symptoms improve over next 2 weeks before starting a new medication.  Meanwhile increase zyprexa to 5 mg a day.

## 2017-05-26 NOTE — Telephone Encounter (Signed)
LMTRC

## 2017-06-02 ENCOUNTER — Other Ambulatory Visit: Payer: Self-pay | Admitting: Family Medicine

## 2017-06-02 NOTE — Telephone Encounter (Signed)
Ok to refill Ativan?

## 2017-06-04 NOTE — Telephone Encounter (Signed)
Medication called to pharmacy. 

## 2017-06-04 NOTE — Telephone Encounter (Signed)
ok 

## 2017-06-19 ENCOUNTER — Encounter: Payer: Self-pay | Admitting: Family Medicine

## 2017-06-19 ENCOUNTER — Ambulatory Visit (INDEPENDENT_AMBULATORY_CARE_PROVIDER_SITE_OTHER): Payer: Medicaid Other | Admitting: Family Medicine

## 2017-06-19 VITALS — BP 110/70 | HR 84 | Temp 98.5°F | Resp 16 | Ht 66.25 in | Wt 175.0 lb

## 2017-06-19 DIAGNOSIS — F3132 Bipolar disorder, current episode depressed, moderate: Secondary | ICD-10-CM | POA: Diagnosis not present

## 2017-06-19 MED ORDER — ESCITALOPRAM OXALATE 10 MG PO TABS: 10.0000 mg | ORAL_TABLET | Freq: Every day | ORAL | 5 refills | Status: DC

## 2017-06-19 NOTE — Progress Notes (Signed)
Subjective:    Patient ID: Sheryl Hall, female    DOB: February 10, 1961, 55 y.o.   MRN: [id number]  HPI  05/15/16 Was seen in the emergency room earlier in June after a syncopal episode that seems like it may have been related to orthostatic hypotension. Was diagnosed with a perforated eardrum. She is here today to follow-up. On examination today the right tympanic membrane is healthy, pearly gray, with no erythema or middle ear effusion. In the left external auditory canal there is a peripheral rim of scar tissue.. There appears to be a large central defect. There is no active bleeding. Hearing is reduced in the left ear.  At that time, my plan was: I will consult ENT regarding the perforated eardrum. She has seen Dr. Richardson Landry in the past and thus will consult again as he has removed her previous medical history. She also has bipolar syndrome/disorder. Recently her psychiatrist recommended that she start Tegretol 300 mg by mouth twice a day as a mood stabilizer. She is concerned by taking Lamictal, Tegretol, Keppra, and Klonopin altogether. I recommended that she start the Tegretol but I did suggest that she discontinue the Lamictal as she has not seen any benefit at this. I will recheck the patient back in one month. She no longer on follow-up of her psychiatrist. I will try to manage her as best I can until he can find a different psychiatrist for consultation.  06/16/16 Patient presents today for follow-up.  Patient states that she feels better since discontinuing the medical. However she continues to have symptoms of mania. She reports a racing and intrusive thoughts. She is having terrible nightmares. For instance in one nightmare she was "being held captive by a cult."  She also reports insomnia. She denies any delusional behavior or hallucinations. She denies any depression or suicidal ideation. She is asking for assistance with helping the intrusive thoughts and the insomnia.  At that time, my plan  was: Increase Trileptal to 450 mg at night. Continue 300 mg in the morning. Maximum dose of Trileptal is 1200 mg a day. However, I'm hesitant to push it to the maximum dose given the fact that she is also on Keppra (for seizures) due to potential side effects and sedation. Recheck in 3 weeks  11/04/16 Since last time I saw her, she saw a psychiatrist at [facility]. They started the patient on Vrylar and weaned her off Klonopin from 4 times a day 1 mg to 0.5 mg twice a day. She came back and saw my partner 2 weeks ago stating that she no longer wanted to see the psychiatrist at [facility]. At that time they discontinue Klonopin She also discontinued Vrylar due to side effects including shortness of breath and tremor. Therefore at the present time she is only on Trileptal as a mood stabilizer and lorazepam 1 mg twice a day my partner started at her last visit for anxiety. She also takes trazodone at night to help her sleep. The combination of the lorazepam and the Trileptal has been working well for the patient. This seems to be controlling her anxiety better than any other medication she's been on in the last several years.  At that time, my plan was: I spent 30 minutes with the patient discussing her medications. I discussed the risk of habituation taking benzodiazepines on a regular basis. However the patient has tried and failed virtually all SSRIs, SNRIs, and mood stabilizers. Therefore I believe the risk benefit is certainly in favor of  continuing lorazepam. I will continue to give her 60 tablets per month. However these must last the entire month. I recommended 1 tablet in the morning and 1 tablet in the evening maximum. Certainly if she's having a good day she can take less. I encouraged the patient to try to ration the tablets to avoid habituation as long as possible. 12/19/16 Overall patient's been doing relatively well. She reports once or twice a week she'll have crying spells where she'll cry for no  reason for several minutes but in between no she is doing very well. She denies any depression. She denies any suicidal ideation. She denies any manic symptoms. She has been able to successfully wean herself down on the Ativan to 1 tablet in the morning and one half tablet in the evening. She is very lonely and isolated. She is unable to drive. She is not working. She is not doing any exercise. She put them a she sits around all day every day. I believe this is contributing quite a bit to her crying spells. She is also recently come to Christmas which is very difficult for her. I believe the season of the year is also affecting her mood  02/12/17 Patient is extremely tearful today. She states that she is very depressed. She reports suicidal thoughts although she has no plan. She is adamant that she would not act on these thoughts. She also reports symptoms of mania including insomnia, racing thoughts. She denies any hallucinations or delusions. Symptoms have been gradually worsening over last 2 or 3 weeks. She also reports a rapid mood swings. She reports anhedonia, poor concentration, poor energy, poor appetite.  At that time, my plan was: I believe the patient would benefit from a mood stabilizer geared towards treating severe depression. In the past she has tried Abilify, Herbalist, [place], Seroquel, Depakote, Lamictal, etc.  she refuses to go back to mental health. I will start the patient on Zyprexa 5 mg by mouth daily at bedtime and recheck in one week or sooner if worse. We contracted for safety. I asked the patient to go to mental health immediately should suicidal ideation return.  02/19/17 For the first time in a long time, the patient states that she feels better. She states that she feels more motivated to leave the home. She's not just laying around crying all day. She actually states that her depression has improved even after only just one week. Her anhedonia has improved. Her insomnia is better.  She starting to exercise more. She denies any suicidal thoughts. She denies any mania or racing thoughts at the present time even though she was endorsing some of these at her last visit. She denies any hallucinations or delusional thoughts. Her biggest concern is she started to gain weight on the medicine. Her weight is up 2 pounds from her last visit even though she is only on 5 mg a day.  At that time, my plan was: I would leave Zyprexa at 5 mg a day. This patient has been suffering with depression off and on her entire life. Most visits that I have had with this patient in the last year have been spent discussing how poorly she feels. For the first time that I can remember she is actually smiling and seems to be in a better mood. Therefore I would leave the medication alone and we discussed strategies to help prevent weight gain including scheduling 30 minutes a day of regular aerobic exercise, emptying the house of clinically  dense food. Strategies to prevent overeating.   05/08/17 Wt Readings from Last 3 Encounters:  06/19/17 175 lb (79.4 kg)  05/08/17 175 lb (79.4 kg)  02/23/17 175 lb (79.4 kg)   Patient states that she's been battling depression. Her aunt was recently diagnosed with metastatic rectal cancer and has been placed on hospice. She has intrusive thoughts of overwhelming sadness and anhedonia. She's been having panic attacks recently. She denies any suicidal ideation. She denies any hallucinations. She denies any auditory hallucinations. She denies any delusional thoughts. She denies any symptoms of mania. She denies any insomnia. She denies any racing thoughts. She denies any increased energy or impulsive behavior. Her biggest concern is overwhelming depression.  At that time, my plan was: I believe the patient would mainly benefit from counseling. I think cognitive behavioral therapy would be quite beneficial to this patient. I will augment the Zyprexa with Prozac 20 mg a day. She does  not want to increase the Zyprexa due to the risk of weight gain and I do not believe that she will garner increase benefit by increasing Trileptal as this is mainly a mood stabilizer. At present I feel that her bipolar regimen is not addressing her depression well enough. Reassess in 6 weeks and consult psychology.  06/19/17 Patient continues to refuse to go [facility]  However she called our office earlier this month requesting to discontinue Prozac as it was causing her anger issues and to be irritable. She stopped the medication and her irritability improved. We did increase Zyprexa to 5 mg a day for irritability and mood wings. Patient states that this is better however her depression is worsening. In fact she endorses suicidal ideation. She states that she has had thoughts of not wanting to be here any longer. She has no plan to commit suicide however she has endorsed thoughts. She feels sad most days. She endorses anhedonia. Your reports poor energy. She reports no desire. She has a hard time leaving the house.  Past Medical History:  Diagnosis Date  . Anxiety   . Arthritis   . Avascular necrosis of hip ([place])    left  . Bipolar 1 disorder ([place])   . Chronic left hip pain   . Chronic nausea   . Depression   . Diverticulosis of colon   . Family hx of colon cancer    age 71-father  . Folate deficiency   . GERD (gastroesophageal reflux disease)   . Headache   . Heart murmur   . Hx of abnormal Pap smear   . Hyperlipidemia   . Hyperthyroidism   . Keratosis, actinic   . Low back pain 03/01/2013   MRI with multiple levels of disc bulge  . Panic attacks   . Polysubstance abuse   . PTSD (post-traumatic stress disorder)   . S/P colonoscopy 05/30/2010   LAX sphincter tone, anal papilla, left-sided diverticulosis, normal random biopsies,, 1 polyp-TA  . S/P endoscopy 05/30/2010   Dr Rourk-> non--critical Schatzki's ring, s/p 36F dilation  . Seizures  ([place])   . Stroke [facility]) 2014   Right parietal, no deficits   . Tubular adenoma of colon 05/30/2010   Next colonoscopy 05/2015  . UTI (urinary tract infection)    Past Surgical History:  Procedure Laterality Date  . COLONOSCOPY     every 5 years  . COLONOSCOPY  05/30/2010   RMR:lax anal sphincter tone,anal papilla,otherwise normal/left-sided diverticula  . ESOPHAGOGASTRODUODENOSCOPY  05/30/2010  [id number] ring/small HH otherwise normal  . EXTERNAL EAR SURGERY Left 12 years ago   skin graft from behind ear put in ear canal  . JOINT REPLACEMENT    . NECK SURGERY  10 years ago  . SKIN CANCER DESTRUCTION    . 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 TOTAL HIP ARTHROPLASTY Right 05/23/2015   Procedure: RIGHT TOTAL HIP ARTHROPLASTY ANTERIOR APPROACH;  Surgeon: Gaynelle Arabian, MD;  Location: [facility];  Service: Orthopedics;  Laterality: Right;   Current Outpatient Prescriptions on File Prior to Visit  Medication Sig Dispense Refill  . aspirin EC 81 MG tablet Take 81 mg by mouth daily.    . fluticasone (FLONASE) 50 MCG/ACT nasal spray Place 2 sprays into both nostrils daily.    . folic acid (FOLVITE) 025 MCG tablet Take by mouth daily.     Marland Kitchen levETIRAcetam (KEPPRA) 1000 MG tablet Take 1 tablet (1,000 mg total) by mouth 2 (two) times daily. . 60 tablet 11  . levothyroxine (SYNTHROID, LEVOTHROID) 50 MCG tablet TAKE ONE TABLET BY MOUTH ONCE DAILY. 30 tablet 11  . LINZESS 72 MCG capsule TAKE ONE CAPSULE ORALLY EVERY MORNING BEFORE BREAKFAST. 90 capsule 3  . LORazepam (ATIVAN) 1 MG tablet TAKE (1) TABLET BY MOUTH TWICE DAILY AS NEEDED FOR ANXIETY. 60 tablet 0  . OLANZapine (ZYPREXA) 5 MG tablet TAKE (1) TABLET BY MOUTH AT BEDTIME. 30 tablet 0  . omeprazole (PRILOSEC) 40 MG capsule TAKE (1) CAPSULE BY MOUTH TWICE DAILY. 60 capsule 5  .  Oxcarbazepine (TRILEPTAL) 300 MG tablet TAKE (1) TABLET BY MOUTH TWICE DAILY. 60 tablet 0  . traZODone (DESYREL) 100 MG tablet Take 1 tablet (100 mg total) by mouth at bedtime. 30 tablet 5  . valACYclovir (VALTREX) 500 MG tablet TAKE ONE TABLET BY MOUTH ONCE DAILY. 30 tablet 5   No current facility-administered medications on file prior to visit.    No Known Allergies Social History   Social History  . Marital status: Divorced    Spouse name: N/A  . Number of children: 1  . Years of education: N/A   Occupational History  . disabled    Social History Main Topics  . Smoking status: Current Every Day Smoker    Packs/day: 0.25    Years: 30.00    Types: Cigarettes  . Smokeless tobacco: Never Used     Comment: trying to quit  . Alcohol use No  . Drug use: No     Comment: hx cocaine abuse  . Sexual activity: No   Other Topics Concern  . Not on file   Social History Narrative   Patient lives at home alone and she is single.    Disabled.   Education college education.   Right handed.   Caffeine mountain dew four daily.      Review of Systems  All other systems reviewed and are negative.      Objective:   Physical Exam  Constitutional: She appears well-developed and well-nourished.  Cardiovascular: Normal rate, regular rhythm and normal heart sounds.   Pulmonary/Chest: Effort normal and breath sounds normal.  Vitals reviewed.         Assessment & Plan:  Bipolar affective disorder, currently depressed, moderate ([place]) - Plan: Ambulatory referral to Psychiatry  Continue Zyprexa at 5 mg a day but add Lexapro 10 mg a day.  I explained to the patient that while I want to help her, I no longer feel qualified to be managing her issues. She must see a psychiatrist is more qualified than high. Therefore I'll consult psychiatry and I begged the patient to attend this consultation. Reassess in 2-3 weeks to see how she's doing or sooner if worse

## 2017-06-24 ENCOUNTER — Telehealth: Payer: Self-pay | Admitting: Family Medicine

## 2017-06-24 NOTE — Telephone Encounter (Signed)
Pt called Sheryl Hall stating that she thought you were going to put her on Lexapro 20mg  but she picked up her med and it was 10mg  . Please advise.

## 2017-06-25 NOTE — Telephone Encounter (Signed)
Pt aware.

## 2017-06-25 NOTE — Telephone Encounter (Signed)
I recommend starting at 10.  20 is maximum dose and I want her to adjust first.

## 2017-06-26 ENCOUNTER — Other Ambulatory Visit: Payer: Self-pay | Admitting: Family Medicine

## 2017-07-02 ENCOUNTER — Other Ambulatory Visit: Payer: Self-pay | Admitting: Family Medicine

## 2017-07-13 ENCOUNTER — Telehealth: Payer: Self-pay | Admitting: Family Medicine

## 2017-07-13 ENCOUNTER — Other Ambulatory Visit: Payer: Self-pay | Admitting: Family Medicine

## 2017-07-13 NOTE — Telephone Encounter (Signed)
Ok to refill 

## 2017-07-13 NOTE — Telephone Encounter (Signed)
ok 

## 2017-07-13 NOTE — Telephone Encounter (Signed)
Pt has called again and wanting to know if you will increase her Lexapro the 10mg  works good but feels like it needs to be increased.

## 2017-07-14 NOTE — Telephone Encounter (Signed)
Tried to call no answer and no VM

## 2017-07-14 NOTE — Telephone Encounter (Signed)
We can increase it to 20 mg poqday

## 2017-07-15 ENCOUNTER — Other Ambulatory Visit: Payer: Self-pay | Admitting: Adult Health

## 2017-07-15 NOTE — Telephone Encounter (Signed)
Tried to call no answer and no vm 

## 2017-07-16 MED ORDER — ESCITALOPRAM OXALATE 20 MG PO TABS: 20.0000 mg | ORAL_TABLET | Freq: Every day | ORAL | 3 refills | Status: DC

## 2017-07-16 NOTE — Telephone Encounter (Signed)
Sent rx to pharm with note about dose change since I am unable to get in touch with patient

## 2017-07-27 ENCOUNTER — Other Ambulatory Visit: Payer: Self-pay | Admitting: Family Medicine

## 2017-07-27 DIAGNOSIS — K219 Gastro-esophageal reflux disease without esophagitis: Secondary | ICD-10-CM

## 2017-07-31 ENCOUNTER — Other Ambulatory Visit: Payer: Self-pay | Admitting: Family Medicine

## 2017-08-17 ENCOUNTER — Other Ambulatory Visit: Payer: Self-pay | Admitting: Family Medicine

## 2017-08-17 NOTE — Telephone Encounter (Signed)
Ok to refill 

## 2017-08-17 NOTE — Telephone Encounter (Signed)
ok 

## 2017-08-18 ENCOUNTER — Other Ambulatory Visit: Payer: Self-pay | Admitting: Family Medicine

## 2017-08-27 ENCOUNTER — Other Ambulatory Visit: Payer: Self-pay | Admitting: Family Medicine

## 2017-09-07 ENCOUNTER — Ambulatory Visit (INDEPENDENT_AMBULATORY_CARE_PROVIDER_SITE_OTHER): Payer: Medicaid Other | Admitting: Neurology

## 2017-09-07 ENCOUNTER — Encounter: Payer: Self-pay | Admitting: Neurology

## 2017-09-07 VITALS — BP 113/79 | HR 73 | Ht 66.25 in | Wt 181.0 lb

## 2017-09-07 DIAGNOSIS — R413 Other amnesia: Secondary | ICD-10-CM

## 2017-09-07 DIAGNOSIS — G40209 Localization-related (focal) (partial) symptomatic epilepsy and epileptic syndromes with complex partial seizures, not intractable, without status epilepticus: Secondary | ICD-10-CM | POA: Diagnosis not present

## 2017-09-07 DIAGNOSIS — Z5181 Encounter for therapeutic drug level monitoring: Secondary | ICD-10-CM | POA: Diagnosis not present

## 2017-09-07 NOTE — Patient Instructions (Signed)
   We will check blood work today and get MRI of the brain. 

## 2017-09-07 NOTE — Progress Notes (Signed)
Reason for visit: Seizures  Sheryl Hall is an 56 y.o. 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 The patient has been on Keppra and she takes Trileptal for bipolar disorder. She has not had any definite seizures since she was seen last, she has returned back to driving. She reports several new problems. She believes that over the last month or so she has had some worsening of memory. The patient will do things such as go to the grocery store and does not remember that she did that. She also has noted some episodes of "shooting stars" in her vision, affecting both eyes. The patient is not sleeping well at night, she is not as drowsy during the day when after the Keppra dosing was altered to 500 mg in the morning and 1500 mg in the evening. The patient has difficulty functioning until about 10:00 in the morning. She goes to bed around 10 PM. She returns to this office for an evaluation. She reports no new numbness or weakness on the face, arms, or legs.  Past Medical History:  Diagnosis Date  . Anxiety   . Arthritis   . Avascular necrosis of hip ([place])    left  . Bipolar 1 disorder ([place])   . Chronic left hip pain   . Chronic nausea   . Depression   . Diverticulosis of colon   . Family hx of colon cancer    age 12-father  . Folate deficiency   . GERD (gastroesophageal reflux disease)   . Headache   . Heart murmur   . Hx of abnormal Pap smear   . Hyperlipidemia   . Hyperthyroidism   . Keratosis, actinic   . Low back pain 03/01/2013   MRI with multiple levels of disc bulge  . Panic attacks   . Polysubstance abuse ([place])   . PTSD (post-traumatic stress disorder)   . S/P colonoscopy 05/30/2010   LAX sphincter tone, anal papilla, left-sided diverticulosis, normal random biopsies,, 1 polyp-TA  . S/P endoscopy 05/30/2010   Dr Rourk->  non--critical Schatzki's ring, s/p 60F dilation  . Seizures ([place])   . Stroke [facility]) 2014   Right parietal, no deficits   . Tubular adenoma of colon 05/30/2010   Next colonoscopy 05/2015  . UTI (urinary tract infection)     Past Surgical History:  Procedure Laterality Date  . COLONOSCOPY     every 5 years  . COLONOSCOPY  05/30/2010   RMR:lax anal sphincter tone,anal papilla,otherwise normal/left-sided diverticula  . ESOPHAGOGASTRODUODENOSCOPY  05/30/2010   PNT:[id number] ring/small HH otherwise normal  . EXTERNAL EAR SURGERY Left 12 years ago   skin graft from behind ear put in ear canal  . JOINT REPLACEMENT    . NECK SURGERY  10 years ago  . SKIN CANCER DESTRUCTION    . 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 TOTAL HIP ARTHROPLASTY Right 05/23/2015   Procedure: RIGHT TOTAL HIP ARTHROPLASTY ANTERIOR APPROACH;  Surgeon: Gaynelle Arabian, MD;  Location: [facility];  Service: Orthopedics;  Laterality: Right;    Family History  Problem Relation Age of Onset  . Diabetes Mother   . Hypertension Mother   . Colon cancer Father 82  living   . Hypertension Father   . Hypertension Sister   . Heart attack Brother   . Seizures Maternal Aunt     Social history:  reports that she has been smoking Cigarettes.  She has a 7.50 pack-year smoking history. She has never used smokeless tobacco. She reports that she does not drink alcohol or use drugs.   No Known Allergies  Medications:  Prior to Admission medications   Medication Sig Start Date End Date Taking? Authorizing Provider  aspirin EC 81 MG tablet Take 81 mg by mouth daily.   Yes [provider]  escitalopram (LEXAPRO) 20 MG tablet Take 1 tablet (20 mg total) by mouth daily. 07/16/17  Yes Susy Frizzle, MD  fluticasone (FLONASE) 50 MCG/ACT nasal spray INHALE 2 SPRAYS IN  EACH NOSTRIL ONCE DAILY. 08/19/17  Yes Susy Frizzle, MD  folic acid (FOLVITE) 952 MCG tablet Take by mouth daily.    Yes [provider]  levETIRAcetam (KEPPRA) 1000 MG tablet 1/2 tablet in the morning and 1.5 tablets in the evening 07/16/17  Yes Kathrynn Ducking, MD  levothyroxine (SYNTHROID, LEVOTHROID) 50 MCG tablet TAKE ONE TABLET BY MOUTH ONCE DAILY. 10/20/16  Yes Pickard, Cammie Mcgee, MD  LINZESS 85 MCG capsule TAKE ONE CAPSULE ORALLY EVERY MORNING BEFORE BREAKFAST. 02/03/17  Yes Annitta Needs, NP  LORazepam (ATIVAN) 1 MG tablet TAKE (1) TABLET BY MOUTH TWICE DAILY AS NEEDED FOR ANXIETY. 08/18/17  Yes Susy Frizzle, MD  OLANZapine (ZYPREXA) 5 MG tablet TAKE (1) TABLET BY MOUTH AT BEDTIME. 08/28/17  Yes Susy Frizzle, MD  omeprazole (PRILOSEC) 40 MG capsule TAKE (1) CAPSULE BY MOUTH TWICE DAILY. 07/28/17  Yes Susy Frizzle, MD  Oxcarbazepine (TRILEPTAL) 300 MG tablet TAKE (1) TABLET BY MOUTH TWICE DAILY. 07/31/17  Yes Susy Frizzle, MD  traZODone (DESYREL) 100 MG tablet TAKE (1) TABLET BY MOUTH AT BEDTIME. 07/28/17  Yes Pickard, Cammie Mcgee, MD  valACYclovir (VALTREX) 500 MG tablet TAKE ONE TABLET BY MOUTH ONCE DAILY. 01/26/17  Yes Susy Frizzle, MD    ROS:  Out of a complete 14 system review of symptoms, the patient complains only of the following symptoms, and all other reviewed systems are negative.  Memory disturbance Vision changes  Blood pressure 113/79, pulse 73, height 5' 6.25" (1.683 m), weight 181 lb (82.1 kg).  Physical Exam  General: The patient is alert and cooperative at the time of the examination.  Skin: No significant peripheral edema is noted.   Neurologic Exam  Mental status: The patient is alert and oriented x 3 at the time of the examination. The patient has apparent normal recent and remote memory, with an apparently normal attention span and concentration ability. The Mini-Mental Status Examination done today shows a total score of  30/30.   Cranial nerves: Facial symmetry is present. Speech is normal, no aphasia or dysarthria is noted. Extraocular movements are full. Visual fields are full.  Motor: The patient has good strength in all 4 extremities.  Sensory examination: Soft touch sensation is symmetric on the face, arms, and legs.  Coordination: The patient has good finger-nose-finger and heel-to-shin bilaterally.  Gait and station: The patient has a normal gait. Tandem gait is unsteady. Romberg is negative. No drift is seen.  Reflexes: Deep tendon reflexes are symmetric.   Assessment/Plan:  1. History seizures  2. Reported memory disturbance  The patient is now reporting some problems with memory. We will check blood work today and check  MRI of the brain given these new changes that have occurred over the last one month. The patient apparently is back to driving. She is doing well with this. We will continue Keppra for now, she is also on Trileptal. She does use marijuana, but this is not a daily issue. She will follow-up in 6 months.  Jill Alexanders MD 09/07/2017 2:23 PM  [facility] [address] [place], [place] [address]  Phone [phone] Fax [phone]

## 2017-09-08 ENCOUNTER — Telehealth: Payer: Self-pay | Admitting: Neurology

## 2017-09-08 LAB — HIV ANTIBODY (ROUTINE TESTING W REFLEX): HIV Screen 4th Generation wRfx: NONREACTIVE

## 2017-09-08 LAB — OXCARBAZEPINE (TRILEPTAL), SERUM: Oxcarbazepine SerPl-Mcnc: 22 ug/mL (ref 10–35)

## 2017-09-08 LAB — COMPREHENSIVE METABOLIC PANEL WITH GFR
ALT: 9 [IU]/L (ref 0–32)
AST: 20 [IU]/L (ref 0–40)
Albumin/Globulin Ratio: 2.1 (ref 1.2–2.2)
Albumin: 4.1 g/dL (ref 3.5–5.5)
Alkaline Phosphatase: 129 [IU]/L — ABNORMAL HIGH (ref 39–117)
BUN/Creatinine Ratio: 7 — ABNORMAL LOW (ref 9–23)
BUN: 7 mg/dL (ref 6–24)
Bilirubin Total: <0.2 mg/dL (ref 0.0–1.2)
CO2: 22 mmol/L (ref 20–29)
Calcium: 9 mg/dL (ref 8.7–10.2)
Chloride: 106 mmol/L (ref 96–106)
Creatinine, Ser: 1.05 mg/dL — ABNORMAL HIGH (ref 0.57–1.00)
GFR calc Af Amer: 69 mL/min/{1.73_m2}
GFR calc non Af Amer: 60 mL/min/{1.73_m2}
Globulin, Total: 2 g/dL (ref 1.5–4.5)
Glucose: 100 mg/dL — ABNORMAL HIGH (ref 65–99)
Potassium: 4.4 mmol/L (ref 3.5–5.2)
Sodium: 142 mmol/L (ref 134–144)
Total Protein: 6.1 g/dL (ref 6.0–8.5)

## 2017-09-08 LAB — SEDIMENTATION RATE: Sed Rate: 9 mm/hr (ref 0–40)

## 2017-09-08 LAB — AMMONIA: Ammonia: 58 ug/dL (ref 19–87)

## 2017-09-08 LAB — LEVETIRACETAM LEVEL: Levetiracetam Lvl: 30.1 ug/mL (ref 10.0–40.0)

## 2017-09-08 LAB — VITAMIN B12: Vitamin B-12: <150 pg/mL — ABNORMAL LOW (ref 232–1245)

## 2017-09-08 LAB — RPR: RPR Ser Ql: NONREACTIVE

## 2017-09-08 NOTE — Telephone Encounter (Signed)
Patient is scheduled to have her MRI at [facility] for Friday 09/11/17.. Medicaid Authorization is [id number] (exp. 09/08/17 to 10/08/17).

## 2017-09-08 NOTE — Telephone Encounter (Signed)
I called patient. The blood work was unremarkable except that the vitamin B12 level was low. I will have the patient come in for an injection of vitamin B12, she will then start on 1000 g vitamin B12 tablets daily.  The levels of Trileptal and Keppra were adequate. No change in dosing.

## 2017-09-09 ENCOUNTER — Ambulatory Visit (INDEPENDENT_AMBULATORY_CARE_PROVIDER_SITE_OTHER): Payer: Medicaid Other | Admitting: *Deleted

## 2017-09-09 DIAGNOSIS — E538 Deficiency of other specified B group vitamins: Secondary | ICD-10-CM

## 2017-09-09 MED ORDER — CYANOCOBALAMIN 1000 MCG/ML IJ SOLN
1000.0000 ug | Freq: Once | INTRAMUSCULAR | Status: AC
  Administered 2017-09-09: 1000 ug via INTRAMUSCULAR

## 2017-09-09 NOTE — Telephone Encounter (Signed)
Patient called back. She will come today at 345pm for B12 injection. She is going to find a ride.

## 2017-09-09 NOTE — Telephone Encounter (Signed)
Tried calling patient on [phone] (no longer in service). Tried [phone] (no VM set up, unable to LVM).  Also tried [phone] that was on DPR and left message for pt asking her to call back to see if she can come in today for B12 injection. Gave [facility] phone number.

## 2017-09-09 NOTE — Progress Notes (Signed)
Gave Vitamin B12 IM in left deltoid. Cleaned with alcohol wipe prior to injection. Band-aid applied. Pt tolerated well.   

## 2017-09-11 ENCOUNTER — Ambulatory Visit (HOSPITAL_COMMUNITY)
Admission: RE | Admit: 2017-09-11 | Discharge: 2017-09-11 | Disposition: A | Discharge status: Home / Self Care | Payer: Medicaid Other | Source: Ambulatory Visit | Attending: Neurology | Admitting: Neurology

## 2017-09-11 DIAGNOSIS — R413 Other amnesia: Secondary | ICD-10-CM

## 2017-09-11 DIAGNOSIS — G40209 Localization-related (focal) (partial) symptomatic epilepsy and epileptic syndromes with complex partial seizures, not intractable, without status epilepticus: Secondary | ICD-10-CM

## 2017-09-16 ENCOUNTER — Other Ambulatory Visit: Payer: Self-pay | Admitting: Family Medicine

## 2017-09-17 NOTE — Telephone Encounter (Signed)
Ok to refill 

## 2017-09-17 NOTE — Telephone Encounter (Signed)
Medication called to pharmacy. 

## 2017-09-17 NOTE — Telephone Encounter (Signed)
ok 

## 2017-09-21 ENCOUNTER — Ambulatory Visit (HOSPITAL_COMMUNITY): Admission: RE | Admit: 2017-09-21 | Payer: Medicaid Other | Source: Ambulatory Visit

## 2017-09-28 ENCOUNTER — Ambulatory Visit (HOSPITAL_COMMUNITY)
Admission: RE | Admit: 2017-09-28 | Discharge: 2017-09-28 | Disposition: A | Discharge status: Home / Self Care | Payer: Medicaid Other | Source: Ambulatory Visit | Attending: Neurology | Admitting: Neurology

## 2017-09-28 ENCOUNTER — Ambulatory Visit (INDEPENDENT_AMBULATORY_CARE_PROVIDER_SITE_OTHER): Payer: Medicaid Other | Admitting: Family Medicine

## 2017-09-28 ENCOUNTER — Telehealth: Payer: Self-pay | Admitting: Neurology

## 2017-09-28 DIAGNOSIS — R413 Other amnesia: Secondary | ICD-10-CM | POA: Diagnosis present

## 2017-09-28 DIAGNOSIS — G40209 Localization-related (focal) (partial) symptomatic epilepsy and epileptic syndromes with complex partial seizures, not intractable, without status epilepticus: Secondary | ICD-10-CM | POA: Diagnosis present

## 2017-09-28 DIAGNOSIS — G9389 Other specified disorders of brain: Secondary | ICD-10-CM | POA: Diagnosis not present

## 2017-09-28 DIAGNOSIS — E538 Deficiency of other specified B group vitamins: Secondary | ICD-10-CM

## 2017-09-28 MED ORDER — CYANOCOBALAMIN 1000 MCG/ML IJ SOLN
1000.0000 ug | INTRAMUSCULAR | Status: AC
  Administered 2017-09-28 – 2017-10-19 (×4): 1000 ug via INTRAMUSCULAR

## 2017-09-28 NOTE — Patient Instructions (Signed)
Pt rec'd first of series of B12 injections.  Understands they are once weekly for 4 weeks then will start monthly

## 2017-09-28 NOTE — Telephone Encounter (Signed)
I called the patient.  MRI of the brain shows the old right occipital/temporal stroke, no new changes.  The patient is getting B12 injections at this time.   MRI brain 09/28/17:  IMPRESSION: 1. 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 2. Otherwise normal MRI of the brain for age. No age advanced or lobar predominant atrophy pattern.

## 2017-10-05 ENCOUNTER — Ambulatory Visit (INDEPENDENT_AMBULATORY_CARE_PROVIDER_SITE_OTHER): Payer: Medicaid Other | Admitting: Family Medicine

## 2017-10-05 DIAGNOSIS — E538 Deficiency of other specified B group vitamins: Secondary | ICD-10-CM | POA: Diagnosis not present

## 2017-10-12 ENCOUNTER — Ambulatory Visit (INDEPENDENT_AMBULATORY_CARE_PROVIDER_SITE_OTHER): Payer: Medicaid Other

## 2017-10-12 DIAGNOSIS — E538 Deficiency of other specified B group vitamins: Secondary | ICD-10-CM

## 2017-10-12 NOTE — Progress Notes (Signed)
Patient was in office today for a b12 injection. Patient received injection in left deltoid.Patient tolerated well

## 2017-10-14 ENCOUNTER — Other Ambulatory Visit: Payer: Self-pay | Admitting: Family Medicine

## 2017-10-14 NOTE — Telephone Encounter (Signed)
ok 

## 2017-10-14 NOTE — Telephone Encounter (Signed)
Ok to refill 

## 2017-10-19 ENCOUNTER — Ambulatory Visit (INDEPENDENT_AMBULATORY_CARE_PROVIDER_SITE_OTHER): Payer: Medicaid Other | Admitting: Family Medicine

## 2017-10-19 DIAGNOSIS — E538 Deficiency of other specified B group vitamins: Secondary | ICD-10-CM

## 2017-10-22 ENCOUNTER — Other Ambulatory Visit: Payer: Self-pay | Admitting: Family Medicine

## 2017-11-19 ENCOUNTER — Ambulatory Visit (INDEPENDENT_AMBULATORY_CARE_PROVIDER_SITE_OTHER): Payer: Medicaid Other | Admitting: *Deleted

## 2017-11-19 ENCOUNTER — Other Ambulatory Visit: Payer: Self-pay

## 2017-11-19 ENCOUNTER — Encounter: Payer: Self-pay | Admitting: *Deleted

## 2017-11-19 DIAGNOSIS — E538 Deficiency of other specified B group vitamins: Secondary | ICD-10-CM

## 2017-11-19 MED ORDER — CYANOCOBALAMIN 1000 MCG/ML IJ SOLN
1000.0000 ug | Freq: Once | INTRAMUSCULAR | Status: AC
  Administered 2017-11-19: 1000 ug via INTRAMUSCULAR

## 2017-11-19 NOTE — Progress Notes (Signed)
Patient seen in office for Vitamin B 12 injection.   Tolerated IM administration well.  

## 2017-11-25 ENCOUNTER — Other Ambulatory Visit: Payer: Self-pay | Admitting: Family Medicine

## 2017-12-08 ENCOUNTER — Other Ambulatory Visit: Payer: Self-pay | Admitting: Physician Assistant

## 2017-12-08 ENCOUNTER — Other Ambulatory Visit: Payer: Self-pay | Admitting: Family Medicine

## 2017-12-23 ENCOUNTER — Other Ambulatory Visit: Payer: Self-pay | Admitting: Family Medicine

## 2017-12-23 ENCOUNTER — Ambulatory Visit: Payer: Medicaid Other

## 2017-12-24 ENCOUNTER — Other Ambulatory Visit: Payer: Self-pay | Admitting: Family Medicine

## 2017-12-24 DIAGNOSIS — Z1231 Encounter for screening mammogram for malignant neoplasm of breast: Secondary | ICD-10-CM

## 2017-12-28 ENCOUNTER — Ambulatory Visit: Payer: Medicaid Other | Admitting: Family Medicine

## 2017-12-28 ENCOUNTER — Encounter: Payer: Self-pay | Admitting: Family Medicine

## 2017-12-28 VITALS — BP 110/68 | HR 66 | Temp 98.1°F | Resp 14 | Ht 66.25 in | Wt 180.0 lb

## 2017-12-28 DIAGNOSIS — G40909 Epilepsy, unspecified, not intractable, without status epilepticus: Secondary | ICD-10-CM

## 2017-12-28 DIAGNOSIS — F3163 Bipolar disorder, current episode mixed, severe, without psychotic features: Secondary | ICD-10-CM

## 2017-12-28 DIAGNOSIS — E538 Deficiency of other specified B group vitamins: Secondary | ICD-10-CM

## 2017-12-28 DIAGNOSIS — E039 Hypothyroidism, unspecified: Secondary | ICD-10-CM

## 2017-12-28 NOTE — Progress Notes (Signed)
Subjective:    Patient ID: Sheryl Hall, female    DOB: February 10, 1961, 57 y.o.   MRN: [id number]  HPI  05/15/16 Was seen in the emergency room earlier in June after a syncopal episode that seems like it may have been related to orthostatic hypotension. Was diagnosed with a perforated eardrum. She is here today to follow-up. On examination today the right tympanic membrane is healthy, pearly gray, with no erythema or middle ear effusion. In the left external auditory canal there is a peripheral rim of scar tissue.. There appears to be a large central defect. There is no active bleeding. Hearing is reduced in the left ear.  At that time, my plan was: I will consult ENT regarding the perforated eardrum. She has seen Dr. Richardson Landry in the past and thus will consult again as he has removed her previous medical history. She also has bipolar syndrome/disorder. Recently her psychiatrist recommended that she start Tegretol 300 mg by mouth twice a day as a mood stabilizer. She is concerned by taking Lamictal, Tegretol, Keppra, and Klonopin altogether. I recommended that she start the Tegretol but I did suggest that she discontinue the Lamictal as she has not seen any benefit at this. I will recheck the patient back in one month. She no longer on follow-up of her psychiatrist. I will try to manage her as best I can until he can find a different psychiatrist for consultation.  06/16/16 Patient presents today for follow-up.  Patient states that she feels better since discontinuing the medical. However she continues to have symptoms of mania. She reports a racing and intrusive thoughts. She is having terrible nightmares. For instance in one nightmare she was "being held captive by a cult."  She also reports insomnia. She denies any delusional behavior or hallucinations. She denies any depression or suicidal ideation. She is asking for assistance with helping the intrusive thoughts and the insomnia.  At that time, my plan  was: Increase Trileptal to 450 mg at night. Continue 300 mg in the morning. Maximum dose of Trileptal is 1200 mg a day. However, I'm hesitant to push it to the maximum dose given the fact that she is also on Keppra (for seizures) due to potential side effects and sedation. Recheck in 3 weeks  11/04/16 Since last time I saw her, she saw a psychiatrist at [facility]. They started the patient on Vrylar and weaned her off Klonopin from 4 times a day 1 mg to 0.5 mg twice a day. She came back and saw my partner 2 weeks ago stating that she no longer wanted to see the psychiatrist at [facility]. At that time they discontinue Klonopin She also discontinued Vrylar due to side effects including shortness of breath and tremor. Therefore at the present time she is only on Trileptal as a mood stabilizer and lorazepam 1 mg twice a day my partner started at her last visit for anxiety. She also takes trazodone at night to help her sleep. The combination of the lorazepam and the Trileptal has been working well for the patient. This seems to be controlling her anxiety better than any other medication she's been on in the last several years.  At that time, my plan was: I spent 30 minutes with the patient discussing her medications. I discussed the risk of habituation taking benzodiazepines on a regular basis. However the patient has tried and failed virtually all SSRIs, SNRIs, and mood stabilizers. Therefore I believe the risk benefit is certainly in favor of  continuing lorazepam. I will continue to give her 60 tablets per month. However these must last the entire month. I recommended 1 tablet in the morning and 1 tablet in the evening maximum. Certainly if she's having a good day she can take less. I encouraged the patient to try to ration the tablets to avoid habituation as long as possible. 12/19/16 Overall patient's been doing relatively well. She reports once or twice a week she'll have crying spells where she'll cry for no  reason for several minutes but in between no she is doing very well. She denies any depression. She denies any suicidal ideation. She denies any manic symptoms. She has been able to successfully wean herself down on the Ativan to 1 tablet in the morning and one half tablet in the evening. She is very lonely and isolated. She is unable to drive. She is not working. She is not doing any exercise. She put them a she sits around all day every day. I believe this is contributing quite a bit to her crying spells. She is also recently come to Christmas which is very difficult for her. I believe the season of the year is also affecting her mood  02/12/17 Patient is extremely tearful today. She states that she is very depressed. She reports suicidal thoughts although she has no plan. She is adamant that she would not act on these thoughts. She also reports symptoms of mania including insomnia, racing thoughts. She denies any hallucinations or delusions. Symptoms have been gradually worsening over last 2 or 3 weeks. She also reports a rapid mood swings. She reports anhedonia, poor concentration, poor energy, poor appetite.  At that time, my plan was: I believe the patient would benefit from a mood stabilizer geared towards treating severe depression. In the past she has tried Abilify, Herbalist, [place], Seroquel, Depakote, Lamictal, etc.  she refuses to go back to mental health. I will start the patient on Zyprexa 5 mg by mouth daily at bedtime and recheck in one week or sooner if worse. We contracted for safety. I asked the patient to go to mental health immediately should suicidal ideation return.  02/19/17 For the first time in a long time, the patient states that she feels better. She states that she feels more motivated to leave the home. She's not just laying around crying all day. She actually states that her depression has improved even after only just one week. Her anhedonia has improved. Her insomnia is better.  She starting to exercise more. She denies any suicidal thoughts. She denies any mania or racing thoughts at the present time even though she was endorsing some of these at her last visit. She denies any hallucinations or delusional thoughts. Her biggest concern is she started to gain weight on the medicine. Her weight is up 2 pounds from her last visit even though she is only on 5 mg a day.  At that time, my plan was: I would leave Zyprexa at 5 mg a day. This patient has been suffering with depression off and on her entire life. Most visits that I have had with this patient in the last year have been spent discussing how poorly she feels. For the first time that I can remember she is actually smiling and seems to be in a better mood. Therefore I would leave the medication alone and we discussed strategies to help prevent weight gain including scheduling 30 minutes a day of regular aerobic exercise, emptying the house of clinically  dense food. Strategies to prevent overeating.   05/08/17 Wt Readings from Last 3 Encounters:  12/28/17 180 lb (81.6 kg)  09/07/17 181 lb (82.1 kg)  06/19/17 175 lb (79.4 kg)   Patient states that she's been battling depression. Her aunt was recently diagnosed with metastatic rectal cancer and has been placed on hospice. She has intrusive thoughts of overwhelming sadness and anhedonia. She's been having panic attacks recently. She denies any suicidal ideation. She denies any hallucinations. She denies any auditory hallucinations. She denies any delusional thoughts. She denies any symptoms of mania. She denies any insomnia. She denies any racing thoughts. She denies any increased energy or impulsive behavior. Her biggest concern is overwhelming depression.    06/19/17 Patient continues to refuse to go back [facility]  However she called our office earlier this month requesting to discontinue Prozac as it was causing her anger issues and  to be irritable. She stopped the medication and her irritability improved. We did increase Zyprexa to 5 mg a day for irritability and mood wings. Patient states that this is better however her depression is worsening. In fact she endorses suicidal ideation. She states that she has had thoughts of not wanting to be here any longer. She has no plan to commit suicide however she has endorsed thoughts. She feels sad most days. She endorses anhedonia. Your reports poor energy. She reports no desire. She has a hard time leaving the house.  At that time, my plan was: I believe the patient would mainly benefit from counseling. I think cognitive behavioral therapy would be quite beneficial to this patient. I will augment the Zyprexa with Prozac 20 mg a day. She does not want to increase the Zyprexa due to the risk of weight gain and I do not believe that she will garner increase benefit by increasing Trileptal as this is mainly a mood stabilizer. At present I feel that her bipolar regimen is not addressing her depression well enough. Reassess in 6 weeks and consult psychology.  At that time, my plan was:  Continue Zyprexa at 5 mg a day but add Lexapro 10 mg a day. I explained to the patient that while I want to help her, I no longer feel qualified to be managing her issues. She must see a psychiatrist is more qualified than high. Therefore I'll consult psychiatry and I begged the patient to attend this consultation. Reassess in 2-3 weeks to see how she's doing or sooner if worse  12/28/17 Ultimately, the patient found stability on Lexapro 10 mg a day coupled with Zyprexa.  She denies any manic symptoms.  She denies any problems with anger or rage or impulse control.  She denies any anxiety attacks.  She denies any delusions or hallucinations.  She denies any suicidal ideation.  She also states that her depression is better.  She denies any anhedonia.  She sees joy in life now.  She is optimistic about the future.   Overall she seems to be doing extremely well on the combination of Lexapro, Zyprexa, and Trileptal.  She continues to smoke.  She has no desire to quit.  Her Pap smear and colonoscopy are up-to-date.  I recommended a mammogram this year.  She is currently seeing neurology for underlying.  She has not had a seizure in more than a year.  They are requesting that we check a vitamin D level given the use of antiepileptics to ensure that she has adequate calcium  and vitamin D supplementation.  They have also switched her from parenteral B12 to oral B12 1000 mcg p.o. daily.  She is here today to recheck a B12 level.  She also has hypothyroidism.  She is currently compliant with levothyroxine.  Aside from fatigue, she is doing extremely well but she is due to recheck a TSH.  Her flu shot is up-to-date  Past Medical History:  Diagnosis Date  . Anxiety   . Arthritis   . Avascular necrosis of hip ([place])    left  . Bipolar 1 disorder ([place])   . Chronic left hip pain   . Chronic nausea   . Depression   . Diverticulosis of colon   . Family hx of colon cancer    age 79-father  . Folate deficiency   . GERD (gastroesophageal reflux disease)   . Headache   . Heart murmur   . Hx of abnormal Pap smear   . Hyperlipidemia   . Hyperthyroidism   . Keratosis, actinic   . Low back pain 03/01/2013   MRI with multiple levels of disc bulge  . Panic attacks   . Polysubstance abuse ([place])   . PTSD (post-traumatic stress disorder)   . S/P colonoscopy 05/30/2010   LAX sphincter tone, anal papilla, left-sided diverticulosis, normal random biopsies,, 1 polyp-TA  . S/P endoscopy 05/30/2010   Dr Rourk-> non--critical Schatzki's ring, s/p 20F dilation  . Seizures ([place])   . Stroke [facility]) 2014   Right parietal, no deficits   . Tubular adenoma of colon 05/30/2010   Next colonoscopy 05/2015  . UTI (urinary tract infection)    Past Surgical History:  Procedure Laterality Date  . COLONOSCOPY     every 5 years  . COLONOSCOPY   05/30/2010   RMR:lax anal sphincter tone,anal papilla,otherwise normal/left-sided diverticula  . ESOPHAGOGASTRODUODENOSCOPY  05/30/2010   FAO:[id number]'G ring/small HH otherwise normal  . EXTERNAL EAR SURGERY Left 12 years ago   skin graft from behind ear put in ear canal  . JOINT REPLACEMENT    . NECK SURGERY  10 years ago  . SKIN CANCER DESTRUCTION    . 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 TOTAL HIP ARTHROPLASTY Right 05/23/2015   Procedure: RIGHT TOTAL HIP ARTHROPLASTY ANTERIOR APPROACH;  Surgeon: Gaynelle Arabian, MD;  Location: [facility];  Service: Orthopedics;  Laterality: Right;   Current Outpatient Medications on File Prior to Visit  Medication Sig Dispense Refill  . aspirin EC 81 MG tablet Take 81 mg by mouth daily.    Marland Kitchen escitalopram (LEXAPRO) 20 MG tablet TAKE (1) TABLET BY MOUTH ONCE DAILY. 30 tablet 0  . fluticasone (FLONASE) 50 MCG/ACT nasal spray INHALE 2 SPRAYS IN EACH NOSTRIL ONCE DAILY. 16 g 5  . folic acid (FOLVITE) 295 MCG tablet Take by mouth daily.     Marland Kitchen levETIRAcetam (KEPPRA) 1000 MG tablet 1/2 tablet in the morning and 1.5 tablets in the evening 180 tablet 1  . levothyroxine (SYNTHROID, LEVOTHROID) 50 MCG tablet TAKE ONE TABLET BY MOUTH ONCE DAILY. 30 tablet 11  . LINZESS 72 MCG capsule TAKE ONE CAPSULE ORALLY EVERY MORNING BEFORE BREAKFAST. 90 capsule 3  . LORazepam (ATIVAN) 1 MG tablet TAKE (1) TABLET BY MOUTH TWICE DAILY AS NEEDED FOR ANXIETY. 60 tablet 0  . OLANZapine (ZYPREXA) 5 MG tablet TAKE (1) TABLET BY MOUTH AT BEDTIME. [place]  tablet 3  . omeprazole (PRILOSEC) 40 MG capsule TAKE (1) CAPSULE BY MOUTH TWICE DAILY. 180 capsule 3  . Oxcarbazepine (TRILEPTAL) 300 MG tablet TAKE (1) TABLET BY MOUTH TWICE DAILY. 60 tablet 5  . traZODone (DESYREL) 100 MG tablet TAKE (1) TABLET BY MOUTH AT BEDTIME. 30 tablet 0  .  valACYclovir (VALTREX) 500 MG tablet TAKE ONE TABLET BY MOUTH ONCE DAILY. 30 tablet 5  . vitamin B-12 (CYANOCOBALAMIN) 1000 MCG tablet Take 1,000 mcg by mouth daily.    . SYMBICORT 160-4.5 MCG/ACT inhaler USE 2 PUFFS TWICE DAILY AS NEEDED. (Patient not taking: Reported on 12/28/2017) 10.2 g 0   No current facility-administered medications on file prior to visit.    No Known Allergies Social History   Socioeconomic History  . Marital status: Divorced    Spouse name: Not on file  . Number of children: 1  . Years of education: Not on file  . Highest education level: Not on file  Social Needs  . Financial resource strain: Not on file  . Food insecurity - worry: Not on file  . Food insecurity - inability: Not on file  . Transportation needs - medical: Not on file  . Transportation needs - non-medical: Not on file  Occupational History  . Occupation: disabled  Tobacco Use  . Smoking status: Current Every Day Smoker    Packs/day: 0.25    Years: 30.00    Pack years: 7.50    Types: Cigarettes  . Smokeless tobacco: Never Used  . Tobacco comment: trying to quit  Substance and Sexual Activity  . Alcohol use: No    Alcohol/week: 0.0 oz  . Drug use: No    Comment: hx cocaine abuse  . Sexual activity: No    Birth control/protection: None  Other Topics Concern  . Not on file  Social History Narrative   Patient lives at home alone and she is single.    Disabled.   Education college education.   Right handed.   Caffeine mountain dew four daily.      Review of Systems  All other systems reviewed and are negative.      Objective:   Physical Exam  Constitutional: She is oriented to person, place, and time. She appears well-developed and well-nourished. No distress.  HENT:  Right Ear: External ear normal.  Left Ear: External ear normal.  Nose: Nose normal.  Mouth/Throat: Oropharynx is clear and moist. No oropharyngeal exudate.  Neck: Neck supple. No JVD present. No thyromegaly  present.  Cardiovascular: Normal rate, regular rhythm and normal heart sounds.  No murmur heard. Pulmonary/Chest: Effort normal and breath sounds normal. No respiratory distress. She has no wheezes. She has no rales.  Abdominal: Soft. Bowel sounds are normal. She exhibits no distension. There is no tenderness. There is no rebound and no guarding.  Musculoskeletal: She exhibits no edema.  Lymphadenopathy:    She has no cervical adenopathy.  Neurological: She is alert and oriented to person, place, and time. No cranial nerve deficit. She exhibits normal muscle tone. Coordination normal.  Skin: No rash noted. She is not diaphoretic. No erythema. No pallor.  Psychiatric: She has a normal mood and affect. Her behavior is normal. Judgment and thought content normal.  Vitals reviewed.         Assessment & Plan:  B12 deficiency - Plan: Vitamin B12  Bipolar disorder, current episode mixed, severe, without psychotic features ([place]) - Plan: CBC with Differential/Platelet, COMPLETE METABOLIC PANEL WITH GFR, TSH, Vitamin B12  Hypothyroidism, unspecified type - Plan: TSH  Nonintractable epilepsy without status epilepticus, unspecified epilepsy type ([place]) - Plan: VITAMIN D 25 Hydroxy (Vit-D Deficiency, Fractures)  For the first time in a long time, the patient's bipolar symptoms have been well controlled for more than 6 months.  Therefore I am hesitant to make any changes in her therapy.  Continue her Lexapro combined with Zyprexa and Trileptal.  I am happy to see the patient enjoying life with overall optimism for the future.  Regarding her seizure disorder, she has been seizure-free for more than a year.  I will check a vitamin D level.  Epilepsy is currently managed by her neurologist.  Regarding her hypothyroidism, I will check a TSH to ensure a therapeutic dosage of her levothyroxine.  Regarding her vitamin B12 deficiency, I will check a vitamin B12 level to ensure that the parenteral injections have  been adequately replaced by oral therapy.  Regarding her general medical care, I continue to encourage smoking cessation.  Flu shot is up-to-date.  Pap smear and colonoscopy is up-to-date.  I recommended a mammogram later this year.

## 2017-12-29 LAB — COMPLETE METABOLIC PANEL WITH GFR
AG RATIO: 2 (calc) (ref 1.0–2.5)
ALKALINE PHOSPHATASE (APISO): 124 U/L (ref 33–130)
ALT: 7 U/L (ref 6–29)
AST: 13 U/L (ref 10–35)
Albumin: 4 g/dL (ref 3.6–5.1)
BILIRUBIN TOTAL: 0.3 mg/dL (ref 0.2–1.2)
BUN: 9 mg/dL (ref 7–25)
CHLORIDE: 105 mmol/L (ref 98–110)
CO2: 26 mmol/L (ref 20–32)
Calcium: 8.9 mg/dL (ref 8.6–10.4)
Creat: 0.94 mg/dL (ref 0.50–1.05)
GFR, Est African American: 79 mL/min/{1.73_m2} (ref >=60)
GFR, Est Non African American: 68 mL/min/{1.73_m2} (ref >=60)
GLOBULIN: 2 g/dL (ref 1.9–3.7)
Glucose, Bld: 90 mg/dL (ref 65–99)
Potassium: 4.5 mmol/L (ref 3.5–5.3)
SODIUM: 142 mmol/L (ref 135–146)
Total Protein: 6 g/dL — ABNORMAL LOW (ref 6.1–8.1)

## 2017-12-29 LAB — TSH: TSH: 1.12 m[IU]/L

## 2017-12-29 LAB — VITAMIN B12: Vitamin B-12: 466 pg/mL (ref 200–1100)

## 2017-12-29 LAB — CBC WITH DIFFERENTIAL/PLATELET
Basophils Absolute: 43 cells/uL (ref 0–200)
Basophils Relative: 0.6 %
EOS ABS: 108 {cells}/uL (ref 15–500)
Eosinophils Relative: 1.5 %
HEMATOCRIT: 37.9 % (ref 35.0–45.0)
HEMOGLOBIN: 12.4 g/dL (ref 11.7–15.5)
LYMPHS ABS: 2383 {cells}/uL (ref 850–3900)
MCH: 27.7 pg (ref 27.0–33.0)
MCHC: 32.7 g/dL (ref 32.0–36.0)
MCV: 84.8 fL (ref 80.0–100.0)
MPV: 11.8 fL (ref 7.5–12.5)
Monocytes Relative: 6.8 %
NEUTROS ABS: 4176 {cells}/uL (ref 1500–7800)
Neutrophils Relative %: 58 %
Platelets: 207 10*3/uL (ref 140–400)
RBC: 4.47 10*6/uL (ref 3.80–5.10)
RDW: 13.5 % (ref 11.0–15.0)
Total Lymphocyte: 33.1 %
WBC mixed population: 490 cells/uL (ref 200–950)
WBC: 7.2 10*3/uL (ref 3.8–10.8)

## 2017-12-29 LAB — VITAMIN D 25 HYDROXY (VIT D DEFICIENCY, FRACTURES): Vit D, 25-Hydroxy: 12 ng/mL — ABNORMAL LOW (ref 30–100)

## 2018-01-06 ENCOUNTER — Encounter: Payer: Self-pay | Admitting: Family Medicine

## 2018-01-06 ENCOUNTER — Other Ambulatory Visit: Payer: Self-pay | Admitting: Family Medicine

## 2018-01-06 ENCOUNTER — Ambulatory Visit (HOSPITAL_COMMUNITY)
Admission: RE | Admit: 2018-01-06 | Discharge: 2018-01-06 | Disposition: A | Discharge status: Home / Self Care | Payer: Medicaid Other | Source: Ambulatory Visit | Attending: Family Medicine | Admitting: Family Medicine

## 2018-01-06 DIAGNOSIS — Z1231 Encounter for screening mammogram for malignant neoplasm of breast: Secondary | ICD-10-CM

## 2018-01-06 MED ORDER — VITAMIN D (ERGOCALCIFEROL) 1.25 MG (50000 UNIT) PO CAPS: 50000.0000 [IU] | ORAL_CAPSULE | ORAL | 1 refills | Status: DC

## 2018-01-22 ENCOUNTER — Other Ambulatory Visit: Payer: Self-pay | Admitting: Family Medicine

## 2018-02-04 ENCOUNTER — Ambulatory Visit (INDEPENDENT_AMBULATORY_CARE_PROVIDER_SITE_OTHER): Payer: Medicaid Other | Admitting: Family Medicine

## 2018-02-04 ENCOUNTER — Encounter: Payer: Self-pay | Admitting: Family Medicine

## 2018-02-04 ENCOUNTER — Other Ambulatory Visit: Payer: Self-pay

## 2018-02-04 VITALS — BP 122/76 | HR 72 | Temp 98.0°F | Resp 16 | Ht 66.25 in | Wt 175.0 lb

## 2018-02-04 DIAGNOSIS — H6122 Impacted cerumen, left ear: Secondary | ICD-10-CM

## 2018-02-04 NOTE — Progress Notes (Signed)
Subjective:    Patient ID: Sheryl Hall, female    DOB: 1961-05-24, 57 y.o.   MRN: [id number]  HPI Patient complains that her left ear feels stopped up.  She denies any pain.  She does report hearing loss and popping and cracking in the ear.  She denies vertigo.  Exam reveals a cerumen impaction in the left auditory canal Past Medical History:  Diagnosis Date  . Anxiety   . Arthritis   . Avascular necrosis of hip ([place])    left  . Bipolar 1 disorder ([place])   . Chronic left hip pain   . Chronic nausea   . Depression   . Diverticulosis of colon   . Family hx of colon cancer    age 71-father  . Folate deficiency   . GERD (gastroesophageal reflux disease)   . Headache   . Heart murmur   . Hx of abnormal Pap smear   . Hyperlipidemia   . Hyperthyroidism   . Keratosis, actinic   . Low back pain 03/01/2013   MRI with multiple levels of disc bulge  . Panic attacks   . Polysubstance abuse ([place])   . PTSD (post-traumatic stress disorder)   . S/P colonoscopy 05/30/2010   LAX sphincter tone, anal papilla, left-sided diverticulosis, normal random biopsies,, 1 polyp-TA  . S/P endoscopy 05/30/2010   Dr Rourk-> non--critical Schatzki's ring, s/p 40F dilation  . Seizures ([place])   . Stroke [facility]) 2014   Right parietal, no deficits   . Tubular adenoma of colon 05/30/2010   Next colonoscopy 05/2015  . UTI (urinary tract infection)    Past Surgical History:  Procedure Laterality Date  . COLONOSCOPY     every 5 years  . COLONOSCOPY  05/30/2010   RMR:lax anal sphincter tone,anal papilla,otherwise normal/left-sided diverticula  . ESOPHAGOGASTRODUODENOSCOPY  05/30/2010   EXH:[id number] ring/small HH otherwise normal  . EXTERNAL EAR SURGERY Left 12 years ago   skin graft from behind ear put in ear canal  . JOINT REPLACEMENT    . NECK SURGERY  10 years ago  . SKIN CANCER DESTRUCTION    . TOTAL HIP ARTHROPLASTY Left 04/19/2014   Procedure: LEFT TOTAL HIP ARTHROPLASTY ANTERIOR APPROACH;  Surgeon:  [name], MD;  [facility];  Service: Orthopedics;  Laterality: Left;  . TOTAL HIP ARTHROPLASTY Right 05/23/2015   Procedure: RIGHT TOTAL HIP ARTHROPLASTY ANTERIOR APPROACH;  Surgeon: Gaynelle Arabian, MD;  Location: [facility];  Service: Orthopedics;  Laterality: Right;   Current Outpatient Medications on File Prior to Visit  Medication Sig Dispense Refill  . aspirin EC 81 MG tablet Take 81 mg by mouth daily.    Marland Kitchen escitalopram (LEXAPRO) 20 MG tablet TAKE (1) TABLET BY MOUTH ONCE DAILY. 30 tablet 5  . fluticasone (FLONASE) 50 MCG/ACT nasal spray INHALE 2 SPRAYS IN EACH NOSTRIL ONCE DAILY. 16 g 5  . folic acid (FOLVITE) 017 MCG tablet Take by mouth daily.     Marland Kitchen levETIRAcetam (KEPPRA) 1000 MG tablet 1/2 tablet in the morning and 1.5 tablets in the evening 180 tablet 1  . levothyroxine (SYNTHROID, LEVOTHROID) 50 MCG tablet TAKE ONE TABLET BY MOUTH ONCE DAILY. 30 tablet 11  . LINZESS 72 MCG capsule TAKE ONE CAPSULE ORALLY EVERY MORNING BEFORE BREAKFAST. 90 capsule 3  . LORazepam (ATIVAN) 1 MG tablet TAKE (1) TABLET BY MOUTH TWICE DAILY AS NEEDED FOR ANXIETY. 60 tablet 0  . OLANZapine (ZYPREXA) 5 MG tablet TAKE (1) TABLET BY MOUTH AT BEDTIME. [place]  tablet 3  . omeprazole (PRILOSEC) 40 MG capsule TAKE (1) CAPSULE BY MOUTH TWICE DAILY. 180 capsule 3  . Oxcarbazepine (TRILEPTAL) 300 MG tablet TAKE (1) TABLET BY MOUTH TWICE DAILY. 60 tablet 5  . SYMBICORT 160-4.5 MCG/ACT inhaler USE 2 PUFFS TWICE DAILY AS NEEDED. 10.2 g 0  . traZODone (DESYREL) 100 MG tablet TAKE (1) TABLET BY MOUTH AT BEDTIME. 30 tablet 0  . valACYclovir (VALTREX) 500 MG tablet TAKE ONE TABLET BY MOUTH ONCE DAILY. 30 tablet 5  . Vitamin D, Ergocalciferol, (DRISDOL) 50000 units CAPS capsule Take 1 capsule (50,000 Units total) by mouth every 7 (seven) days. 12 capsule 1  . vitamin B-12 (CYANOCOBALAMIN) 1000 MCG tablet Take 1,000 mcg by mouth daily.     No current facility-administered medications on file prior to visit.    No Known  Allergies Social History   Socioeconomic History  . Marital status: Divorced    Spouse name: Not on file  . Number of children: 1  . Years of education: Not on file  . Highest education level: Not on file  Social Needs  . Financial resource strain: Not on file  . Food insecurity - worry: Not on file  . Food insecurity - inability: Not on file  . Transportation needs - medical: Not on file  . Transportation needs - non-medical: Not on file  Occupational History  . Occupation: disabled  Tobacco Use  . Smoking status: Current Every Day Smoker    Packs/day: 0.25    Years: 30.00    Pack years: 7.50    Types: Cigarettes  . Smokeless tobacco: Never Used  . Tobacco comment: trying to quit  Substance and Sexual Activity  . Alcohol use: No    Alcohol/week: 0.0 oz  . Drug use: No    Comment: hx cocaine abuse  . Sexual activity: No    Birth control/protection: None  Other Topics Concern  . Not on file  Social History Narrative   Patient lives at home alone and she is single.    Disabled.   Education college education.   Right handed.   Caffeine mountain dew four daily.       Review of Systems  All other systems reviewed and are negative.      Objective:   Physical Exam  Constitutional: She appears well-developed and well-nourished.  HENT:  Right Ear: Tympanic membrane and ear canal normal.  Left Ear: A foreign body is present.  Cardiovascular: Normal rate, regular rhythm and normal heart sounds.  Pulmonary/Chest: Effort normal and breath sounds normal.  Vitals reviewed.  Left auditory canal is completely occluded with wax       Assessment & Plan:  Cerumen impaction in the left ear.  Easily removed with irrigation and lavage.  Patient tolerated the procedure well without complication.  Underlying tympanic membrane appeared healthy and not infected

## 2018-02-05 ENCOUNTER — Other Ambulatory Visit: Payer: Self-pay | Admitting: Family Medicine

## 2018-02-08 ENCOUNTER — Other Ambulatory Visit: Payer: Self-pay | Admitting: Gastroenterology

## 2018-02-08 ENCOUNTER — Other Ambulatory Visit: Payer: Self-pay | Admitting: Neurology

## 2018-02-08 ENCOUNTER — Other Ambulatory Visit: Payer: Self-pay | Admitting: Family Medicine

## 2018-02-08 NOTE — Telephone Encounter (Signed)
Requesting refill    Lorazepam  LOV: 02/04/18  LRF:  09/17/17

## 2018-02-15 ENCOUNTER — Encounter: Payer: Self-pay | Admitting: Family Medicine

## 2018-02-22 ENCOUNTER — Other Ambulatory Visit: Payer: Self-pay | Admitting: Family Medicine

## 2018-03-08 ENCOUNTER — Encounter: Payer: Self-pay | Admitting: Adult Health

## 2018-03-08 ENCOUNTER — Ambulatory Visit: Payer: Medicaid Other | Admitting: Adult Health

## 2018-03-08 VITALS — BP 130/87 | HR 75 | Ht 66.0 in | Wt 184.0 lb

## 2018-03-08 DIAGNOSIS — G40209 Localization-related (focal) (partial) symptomatic epilepsy and epileptic syndromes with complex partial seizures, not intractable, without status epilepticus: Secondary | ICD-10-CM | POA: Diagnosis not present

## 2018-03-08 DIAGNOSIS — R413 Other amnesia: Secondary | ICD-10-CM | POA: Diagnosis not present

## 2018-03-08 NOTE — Progress Notes (Signed)
PATIENT: Sheryl Hall DOB: 1961/05/22  REASON FOR VISIT: follow up HISTORY FROM: patient  HISTORY OF PRESENT ILLNESS: Today 03/08/18 Sheryl Hall is a 57 year old female with a history of seizures.  She returns today for follow-up.  She is currently on Keppra and tolerating it well.  She states that she has not had any additional seizure events.  She reports that she was at the dentist last Wednesday and then afterwards went to her mother's house.  He states that when she was on her way home she became diaphoretic and felt lightheaded.  She states that she did not pass out and these symptoms eventually resolved.  She has not had any additional symptoms since that time.  She states that her memory has remained stable.  She lives at home alone.  All ADLs independently.  She manages her own finances.prepares her own meals.  She denies any trouble sleeping.  She denies hallucinations.  Her primary care is treating vitamin B12 and vitamin D deficiency.  She returns today for an evaluation.  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 The patient has been on Keppra and she takes Trileptal for bipolar disorder. She has not had any definite seizures since she was seen last, she has returned back to driving. She reports several new problems. She believes that over the last month or so she has had some worsening of memory. The patient will do things such as go to the grocery store and does not remember that she did that. She also has noted some episodes of "shooting stars" in her vision, affecting both eyes. The patient is not sleeping well at night, she is not as drowsy during the day when after the Keppra dosing was altered to 500 mg in the morning and 1500 mg in the evening. The patient has difficulty functioning until about 10:00 in the morning. She goes to bed around 10  PM. She returns to this office for an evaluation. She reports no new numbness or weakness on the face, arms, or legs.    REVIEW OF SYSTEMS: Out of a complete 14 system review of symptoms, the patient complains only of the following symptoms, and all other reviewed systems are negative.  Depression, nervous/anxious, dizziness, headache, fatigue, murmur, restless  ALLERGIES: No Known Allergies  HOME MEDICATIONS: Outpatient Medications Prior to Visit  Medication Sig Dispense Refill  . aspirin EC 81 MG tablet Take 81 mg by mouth daily.    Marland Kitchen escitalopram (LEXAPRO) 20 MG tablet TAKE (1) TABLET BY MOUTH ONCE DAILY. 30 tablet 5  . folic acid (FOLVITE) 956 MCG tablet Take by mouth daily.     Marland Kitchen levETIRAcetam (KEPPRA) 1000 MG tablet TAKE 1/2 TABLET IN THE MORNING AND 1&1/2 TABLETS IN THE EVENING. 180 tablet 0  . levothyroxine (SYNTHROID, LEVOTHROID) 50 MCG tablet TAKE ONE TABLET BY MOUTH ONCE DAILY. 30 tablet 11  . LINZESS 72 MCG capsule TAKE ONE CAPSULE ORALLY EVERY MORNING BEFORE BREAKFAST. 30 capsule 3  . LORazepam (ATIVAN) 1 MG tablet TAKE (1) TABLET BY MOUTH TWICE DAILY AS NEEDED FOR ANXIETY. 60 tablet 0  . OLANZapine (ZYPREXA) 5 MG tablet TAKE (1) TABLET BY MOUTH AT BEDTIME. 30 tablet 5  . omeprazole (PRILOSEC) 40 MG capsule TAKE (1) CAPSULE BY MOUTH TWICE DAILY. 180 capsule 3  . Oxcarbazepine (TRILEPTAL) 300 MG tablet TAKE (1) TABLET BY MOUTH TWICE DAILY. 60 tablet  5  . SYMBICORT 160-4.5 MCG/ACT inhaler USE 2 PUFFS TWICE DAILY AS NEEDED. 10.2 g 0  . traZODone (DESYREL) 100 MG tablet TAKE (1) TABLET BY MOUTH AT BEDTIME. 30 tablet 0  . valACYclovir (VALTREX) 500 MG tablet TAKE ONE TABLET BY MOUTH ONCE DAILY. 30 tablet 3  . vitamin B-12 (CYANOCOBALAMIN) 1000 MCG tablet Take 1,000 mcg by mouth daily.    . Vitamin D, Ergocalciferol, (DRISDOL) 50000 units CAPS capsule Take 1 capsule (50,000 Units total) by mouth every 7 (seven) days. 12 capsule 1  . fluticasone (FLONASE) 50 MCG/ACT nasal spray  INHALE 2 SPRAYS IN EACH NOSTRIL ONCE DAILY. 16 g 5  . LORazepam (ATIVAN) 1 MG tablet TAKE (1) TABLET BY MOUTH TWICE DAILY AS NEEDED FOR ANXIETY. 60 tablet 0   No facility-administered medications prior to visit.     PAST MEDICAL HISTORY: Past Medical History:  Diagnosis Date  . Anxiety   . Arthritis   . Avascular necrosis of hip ([place])    left  . Bipolar 1 disorder ([place])   . Chronic left hip pain   . Chronic nausea   . Depression   . Diverticulosis of colon   . Family hx of colon cancer    age 18-father  . Folate deficiency   . GERD (gastroesophageal reflux disease)   . Headache   . Heart murmur   . Hx of abnormal Pap smear   . Hyperlipidemia   . Hyperthyroidism   . Keratosis, actinic   . Low back pain 03/01/2013   MRI with multiple levels of disc bulge  . Panic attacks   . Polysubstance abuse ([place])   . PTSD (post-traumatic stress disorder)   . S/P colonoscopy 05/30/2010   LAX sphincter tone, anal papilla, left-sided diverticulosis, normal random biopsies,, 1 polyp-TA  . S/P endoscopy 05/30/2010   Dr Rourk-> non--critical Schatzki's ring, s/p 64F dilation  . Seizures ([place])   . Stroke [facility]) 2014   Right parietal, no deficits   . Tubular adenoma of colon 05/30/2010   Next colonoscopy 05/2015  . UTI (urinary tract infection)     PAST SURGICAL HISTORY: Past Surgical History:  Procedure Laterality Date  . COLONOSCOPY     every 5 years  . COLONOSCOPY  05/30/2010   RMR:lax anal sphincter tone,anal papilla,otherwise normal/left-sided diverticula  . ESOPHAGOGASTRODUODENOSCOPY  05/30/2010   XNA:[id number]'Y ring/small HH otherwise normal  . EXTERNAL EAR SURGERY Left 12 years ago   skin graft from behind ear put in ear canal  . JOINT REPLACEMENT    . NECK SURGERY  10 years ago  . SKIN CANCER DESTRUCTION    . TOTAL HIP ARTHROPLASTY Left 04/19/2014   Procedure: LEFT TOTAL HIP ARTHROPLASTY ANTERIOR APPROACH;  Surgeon: [name], MD;  Location: [facility];  Service: Orthopedics;   Laterality: Left;  . TOTAL HIP ARTHROPLASTY Right 05/23/2015   Procedure: RIGHT TOTAL HIP ARTHROPLASTY ANTERIOR APPROACH;  Surgeon: Gaynelle Arabian, MD;  Location: [facility];  Service: Orthopedics;  Laterality: Right;    FAMILY HISTORY: Family History  Problem Relation Age of Onset  . Diabetes Mother   . Hypertension Mother   . Colon cancer Father 22       living   . Hypertension Father   . Hypertension Sister   . Heart attack Brother   . Seizures Maternal Aunt     SOCIAL HISTORY: Social History   Socioeconomic History  . Marital status: Divorced    Spouse name: Not on file  . Number of  children: 1  . Years of education: Not on file  . Highest education level: Not on file  Occupational History  . Occupation: disabled  Social Needs  . Financial resource strain: Not on file  . Food insecurity:    Worry: Not on file    Inability: Not on file  . Transportation needs:    Medical: Not on file    Non-medical: Not on file  Tobacco Use  . Smoking status: Current Every Day Smoker    Packs/day: 0.25    Years: 30.00    Pack years: 7.50    Types: Cigarettes  . Smokeless tobacco: Never Used  . Tobacco comment: trying to quit  Substance and Sexual Activity  . Alcohol use: No    Alcohol/week: 0.0 oz  . Drug use: No    Comment: hx cocaine abuse  . Sexual activity: Never    Birth control/protection: None  Lifestyle  . Physical activity:    Days per week: Not on file    Minutes per session: Not on file  . Stress: Not on file  Relationships  . Social connections:    Talks on phone: Not on file    Gets together: Not on file    Attends religious service: Not on file    Active member of club or organization: Not on file    Attends meetings of clubs or organizations: Not on file    Relationship status: Not on file  . Intimate partner violence:    Fear of current or ex partner: Not on file    Emotionally abused: Not on file    Physically abused: Not on file    Forced sexual  activity: Not on file  Other Topics Concern  . Not on file  Social History Narrative   Patient lives at home alone and she is single.    Disabled.   Education college education.   Right handed.   Caffeine mountain dew four daily.       PHYSICAL EXAM  Vitals:   03/08/18 1420  BP: 130/87  Pulse: 75  Weight: 184 lb (83.5 kg)  Height: 5\' 6"  (1.676 m)   Body mass index is 29.7 kg/m.   MMSE - Mini Mental State Exam 03/08/2018 09/07/2017  Orientation to time 5 5  Orientation to Place 5 5  Registration 3 3  Attention/ Calculation 1 5  Recall 3 3  Language- name 2 objects 2 2  Language- repeat 1 1  Language- follow 3 step command 3 3  Language- read & follow direction 1 1  Write a sentence 1 1  Copy design 1 1  Total score 26 30    Generalized: Well developed, in no acute distress   Neurological examination  Mentation: Alert oriented to time, place, history taking. Follows all commands speech and language fluent Cranial nerve II-XII: Pupils were equal round reactive to light. Extraocular movements were full, visual field were full on confrontational test. Facial sensation and strength were normal. Uvula tongue midline. Head turning and shoulder shrug  were normal and symmetric. Motor: The motor testing reveals 5 over 5 strength of all 4 extremities. Good symmetric motor tone is noted throughout.  Sensory: Sensory testing is intact to soft touch on all 4 extremities. No evidence of extinction is noted.  Coordination: Cerebellar testing reveals good finger-nose-finger and heel-to-shin bilaterally.  Gait and station: Gait is normal. Tandem gait is unsteady.. Romberg is negative. No drift is seen.  Reflexes: Deep tendon reflexes are symmetric  and normal bilaterally.   DIAGNOSTIC DATA (LABS, IMAGING, TESTING) - I reviewed patient records, labs, notes, testing and imaging myself where available.  Lab Results  Component Value Date   WBC 7.2 12/28/2017   HGB 12.4 12/28/2017    HCT 37.9 12/28/2017   MCV 84.8 12/28/2017   PLT 207 12/28/2017      Component Value Date/Time   NA 142 12/28/2017 1641   NA 142 09/07/2017 1459   NA 144 10/27/2014 0625   K 4.5 12/28/2017 1641   K 4.2 10/27/2014 0625   K 4.4 08/27/2011 1314   CL 105 12/28/2017 1641   CL 107 10/27/2014 0625   CL 102 08/27/2011 1314   CO2 26 12/28/2017 1641   CO2 29 10/27/2014 0625   CO2 25 08/27/2011 1314   GLUCOSE 90 12/28/2017 1641   GLUCOSE 94 10/27/2014 0625   BUN 9 12/28/2017 1641   BUN 7 09/07/2017 1459   BUN 10 10/27/2014 0625   CREATININE 0.94 12/28/2017 1641   CALCIUM 8.9 12/28/2017 1641   CALCIUM 8.9 10/27/2014 0625   CALCIUM 9.9 08/27/2011 1314   PROT 6.0 (L) 12/28/2017 1641   PROT 6.1 09/07/2017 1459   PROT 5.9 (L) 10/27/2014 0625   ALBUMIN 4.1 09/07/2017 1459   ALBUMIN 3.3 (L) 10/27/2014 0625   AST 13 12/28/2017 1641   AST 16 10/27/2014 0625   AST 11 08/27/2011 1314   ALT 7 12/28/2017 1641   ALT 14 10/27/2014 0625   ALKPHOS 129 (H) 09/07/2017 1459   ALKPHOS 119 (H) 10/27/2014 0625   ALKPHOS 91 08/27/2011 1314   BILITOT 0.3 12/28/2017 1641   BILITOT <0.2 09/07/2017 1459   BILITOT 0.4 10/27/2014 0625   BILITOT 0.5 08/27/2011 1314   GFRNONAA 68 12/28/2017 1641   GFRAA 79 12/28/2017 1641   Lab Results  Component Value Date   CHOL 240 (H) 09/29/2016   HDL 42 (L) 09/29/2016   LDLCALC 163 (H) 09/29/2016   TRIG 175 (H) 09/29/2016   CHOLHDL 5.7 (H) 09/29/2016   No results found for: HGBA1C Lab Results  Component Value Date   VITAMINB12 466 12/28/2017   Lab Results  Component Value Date   TSH 1.12 12/28/2017      ASSESSMENT AND PLAN 57 y.o. year old female  has a past medical history of Anxiety, Arthritis, Avascular necrosis of hip ([place]), Bipolar 1 disorder ([place]), Chronic left hip pain, Chronic nausea, Depression, Diverticulosis of colon, Family hx of colon cancer, Folate deficiency, GERD (gastroesophageal reflux disease), Headache, Heart murmur, abnormal Pap  smear, Hyperlipidemia, Hyperthyroidism, Keratosis, actinic, Low back pain (03/01/2013), Panic attacks, Polysubstance abuse ([place]), PTSD (post-traumatic stress disorder), S/P colonoscopy (05/30/2010), S/P endoscopy (05/30/2010), Seizures ([place]), Stroke ([place]) (2014), Tubular adenoma of colon (05/30/2010), and UTI (urinary tract infection). here with:  1.  Memory disturbance 2.  Seizures  The patient's memory score has declined slightly.  The patient states that she was flustered when she was asked to to do the subtraction.  She refused to complete this during the test but was able to do it afterwards.  For now we will continue to monitor her memory.  She will continue on Keppra for seizures.  If she has any seizure events she should let us know.  She will follow-up in 6 months or sooner if needed.    Ward Givens, MSN, NP-C 03/08/2018, 2:21 PM [facility] [address], [place], [place] [address] [phone]

## 2018-03-08 NOTE — Progress Notes (Signed)
I have read the note, and I agree with the clinical assessment and plan.  Sheryl Hall K Sheryl Hall   

## 2018-03-08 NOTE — Patient Instructions (Addendum)
Your Plan:  Continue Keppra Memory score is slightly decreased- we will continue to monitor If your symptoms worsen or you develop new symptoms please let us know.   Thank you for coming to see [place] at [facility]. I hope we have been able to provide you high quality care today.  You may receive a patient satisfaction survey over the next few weeks. We would appreciate your feedback and comments so that we may continue to improve ourselves and the health of our patients.

## 2018-03-11 ENCOUNTER — Other Ambulatory Visit: Payer: Self-pay | Admitting: Family Medicine

## 2018-03-15 NOTE — Telephone Encounter (Signed)
Ok to refill??  Last office visit 02/04/2018.  Last refill 02/08/2018.

## 2018-04-05 ENCOUNTER — Encounter: Payer: Self-pay | Admitting: Family Medicine

## 2018-04-05 ENCOUNTER — Other Ambulatory Visit: Payer: Self-pay | Admitting: Family Medicine

## 2018-04-05 MED ORDER — TRAZODONE HCL 100 MG PO TABS: ORAL_TABLET | ORAL | 0 refills | Status: DC

## 2018-04-12 ENCOUNTER — Other Ambulatory Visit: Payer: Self-pay | Admitting: Family Medicine

## 2018-04-12 NOTE — Telephone Encounter (Signed)
Ok to refill??  Last office visit 02/04/2018.  Last refill 03/15/2018.

## 2018-05-03 ENCOUNTER — Other Ambulatory Visit: Payer: Self-pay | Admitting: Family Medicine

## 2018-05-12 ENCOUNTER — Other Ambulatory Visit: Payer: Self-pay | Admitting: Neurology

## 2018-05-12 ENCOUNTER — Other Ambulatory Visit: Payer: Self-pay | Admitting: Family Medicine

## 2018-05-13 NOTE — Telephone Encounter (Signed)
Requesting refill    Lorazepam  LOV: 02/04/18  LRF:  04/12/18

## 2018-05-14 ENCOUNTER — Ambulatory Visit: Payer: Medicaid Other | Admitting: Family Medicine

## 2018-05-24 ENCOUNTER — Other Ambulatory Visit: Payer: Self-pay | Admitting: Family Medicine

## 2018-05-24 ENCOUNTER — Other Ambulatory Visit: Payer: Self-pay | Admitting: Nurse Practitioner

## 2018-06-01 ENCOUNTER — Ambulatory Visit: Payer: Medicaid Other | Admitting: Family Medicine

## 2018-06-01 ENCOUNTER — Encounter: Payer: Self-pay | Admitting: Family Medicine

## 2018-06-01 VITALS — BP 148/92 | HR 80 | Temp 98.3°F | Resp 16 | Ht 66.25 in | Wt 185.0 lb

## 2018-06-01 DIAGNOSIS — E039 Hypothyroidism, unspecified: Secondary | ICD-10-CM | POA: Diagnosis not present

## 2018-06-01 DIAGNOSIS — F319 Bipolar disorder, unspecified: Secondary | ICD-10-CM

## 2018-06-01 MED ORDER — OLANZAPINE 7.5 MG PO TABS: 7.5000 mg | ORAL_TABLET | Freq: Every day | ORAL | 1 refills | Status: DC

## 2018-06-01 NOTE — Progress Notes (Signed)
Subjective:    Patient ID: [name], female    DOB: [birth year], 57 y.o.   MRN: [id number]  Medication Refill     05/15/16 Was seen in the emergency room earlier in June after a syncopal episode that seems like it may have been related to orthostatic hypotension. Was diagnosed with a perforated eardrum. She is here today to follow-up. On examination today the right tympanic membrane is healthy, pearly gray, with no erythema or middle ear effusion. In the left external auditory canal there is a peripheral rim of scar tissue.. There appears to be a large central defect. There is no active bleeding. Hearing is reduced in the left ear.  At that time, my plan was: I will consult ENT regarding the perforated eardrum. She has seen Dr. Richardson Landry in the past and thus will consult again as he has removed her previous medical history. She also has bipolar syndrome/disorder. Recently her psychiatrist recommended that she start Tegretol 300 mg by mouth twice a day as a mood stabilizer. She is concerned by taking Lamictal, Tegretol, Keppra, and Klonopin altogether. I recommended that she start the Tegretol but I did suggest that she discontinue the Lamictal as she has not seen any benefit at this. I will recheck the patient back in one month. She no longer on follow-up of her psychiatrist. I will try to manage her as best I can until he can find a different psychiatrist for consultation.  06/16/16 Patient presents today for follow-up.  Patient states that she feels better since discontinuing the medical. However she continues to have symptoms of mania. She reports a racing and intrusive thoughts. She is having terrible nightmares. For instance in one nightmare she was "being held captive by a cult."  She also reports insomnia. She denies any delusional behavior or hallucinations. She denies any depression or suicidal ideation. She is asking for assistance with helping the intrusive thoughts and the insomnia.  At  that time, my plan was: Increase Trileptal to 450 mg at night. Continue 300 mg in the morning. Maximum dose of Trileptal is 1200 mg a day. However, I'm hesitant to push it to the maximum dose given the fact that she is also on Keppra (for seizures) due to potential side effects and sedation. Recheck in 3 weeks  11/04/16 Since last time I saw her, she saw a psychiatrist at [facility]. They started the patient on Vrylar and weaned her off Klonopin from 4 times a day 1 mg to 0.5 mg twice a day. She came back and saw my partner 2 weeks ago stating that she no longer wanted to see the psychiatrist at [facility]. At that time they discontinue Klonopin She also discontinued Vrylar due to side effects including shortness of breath and tremor. Therefore at the present time she is only on Trileptal as a mood stabilizer and lorazepam 1 mg twice a day my partner started at her last visit for anxiety. She also takes trazodone at night to help her sleep. The combination of the lorazepam and the Trileptal has been working well for the patient. This seems to be controlling her anxiety better than any other medication she's been on in the last several years.  At that time, my plan was: I spent 30 minutes with the patient discussing her medications. I discussed the risk of habituation taking benzodiazepines on a regular basis. However the patient has tried and failed virtually all SSRIs, SNRIs, and mood stabilizers. Therefore I believe the risk benefit is  certainly in favor of continuing lorazepam. I will continue to give her 60 tablets per month. However these must last the entire month. I recommended 1 tablet in the morning and 1 tablet in the evening maximum. Certainly if she's having a good day she can take less. I encouraged the patient to try to ration the tablets to avoid habituation as long as possible. 12/19/16 Overall patient's been doing relatively well. She reports once or twice a week she'll have crying spells where  she'll cry for no reason for several minutes but in between no she is doing very well. She denies any depression. She denies any suicidal ideation. She denies any manic symptoms. She has been able to successfully wean herself down on the Ativan to 1 tablet in the morning and one half tablet in the evening. She is very lonely and isolated. She is unable to drive. She is not working. She is not doing any exercise. She put them a she sits around all day every day. I believe this is contributing quite a bit to her crying spells. She is also recently come to Christmas which is very difficult for her. I believe the season of the year is also affecting her mood  02/12/17 Patient is extremely tearful today. She states that she is very depressed. She reports suicidal thoughts although she has no plan. She is adamant that she would not act on these thoughts. She also reports symptoms of mania including insomnia, racing thoughts. She denies any hallucinations or delusions. Symptoms have been gradually worsening over last 2 or 3 weeks. She also reports a rapid mood swings. She reports anhedonia, poor concentration, poor energy, poor appetite.  At that time, my plan was: I believe the patient would benefit from a mood stabilizer geared towards treating severe depression. In the past she has tried Abilify, Herbalist, [place], Seroquel, Depakote, Lamictal, etc.  she refuses to go back to mental health. I will start the patient on Zyprexa 5 mg by mouth daily at bedtime and recheck in one week or sooner if worse. We contracted for safety. I asked the patient to go to mental health immediately should suicidal ideation return.  02/19/17 For the first time in a long time, the patient states that she feels better. She states that she feels more motivated to leave the home. She's not just laying around crying all day. She actually states that her depression has improved even after only just one week. Her anhedonia has improved. Her  insomnia is better. She starting to exercise more. She denies any suicidal thoughts. She denies any mania or racing thoughts at the present time even though she was endorsing some of these at her last visit. She denies any hallucinations or delusional thoughts. Her biggest concern is she started to gain weight on the medicine. Her weight is up 2 pounds from her last visit even though she is only on 5 mg a day.  At that time, my plan was: I would leave Zyprexa at 5 mg a day. This patient has been suffering with depression off and on her entire life. Most visits that I have had with this patient in the last year have been spent discussing how poorly she feels. For the first time that I can remember she is actually smiling and seems to be in a better mood. Therefore I would leave the medication alone and we discussed strategies to help prevent weight gain including scheduling 30 minutes a day of regular aerobic exercise, emptying  the house of clinically dense food. Strategies to prevent overeating.   05/08/17 Wt Readings from Last 3 Encounters:  06/01/18 185 lb (83.9 kg)  03/08/18 184 lb (83.5 kg)  02/04/18 175 lb (79.4 kg)   Patient states that she's been battling depression. Her aunt was recently diagnosed with metastatic rectal cancer and has been placed on hospice. She has intrusive thoughts of overwhelming sadness and anhedonia. She's been having panic attacks recently. She denies any suicidal ideation. She denies any hallucinations. She denies any auditory hallucinations. She denies any delusional thoughts. She denies any symptoms of mania. She denies any insomnia. She denies any racing thoughts. She denies any increased energy or impulsive behavior. Her biggest concern is overwhelming depression.    06/19/17 Patient continues to refuse to go back [facility]  However she called our office earlier this month requesting to discontinue Prozac as it was causing  her anger issues and to be irritable. She stopped the medication and her irritability improved. We did increase Zyprexa to 5 mg a day for irritability and mood wings. Patient states that this is better however her depression is worsening. In fact she endorses suicidal ideation. She states that she has had thoughts of not wanting to be here any longer. She has no plan to commit suicide however she has endorsed thoughts. She feels sad most days. She endorses anhedonia. Your reports poor energy. She reports no desire. She has a hard time leaving the house.  At that time, my plan was: I believe the patient would mainly benefit from counseling. I think cognitive behavioral therapy would be quite beneficial to this patient. I will augment the Zyprexa with Prozac 20 mg a day. She does not want to increase the Zyprexa due to the risk of weight gain and I do not believe that she will garner increase benefit by increasing Trileptal as this is mainly a mood stabilizer. At present I feel that her bipolar regimen is not addressing her depression well enough. Reassess in 6 weeks and consult psychology.  At that time, my plan was:  Continue Zyprexa at 5 mg a day but add Lexapro 10 mg a day. I explained to the patient that while I want to help her, I no longer feel qualified to be managing her issues. She must see a psychiatrist is more qualified than high. Therefore I'll consult psychiatry and I begged the patient to attend this consultation. Reassess in 2-3 weeks to see how she's doing or sooner if worse  12/28/17 Ultimately, the patient found stability on Lexapro 10 mg a day coupled with Zyprexa.  She denies any manic symptoms.  She denies any problems with anger or rage or impulse control.  She denies any anxiety attacks.  She denies any delusions or hallucinations.  She denies any suicidal ideation.  She also states that her depression is better.  She denies any anhedonia.  She sees joy in life now.  She is optimistic  about the future.  Overall she seems to be doing extremely well on the combination of Lexapro, Zyprexa, and Trileptal.  She continues to smoke.  She has no desire to quit.  Her Pap smear and colonoscopy are up-to-date.  I recommended a mammogram this year.  She is currently seeing neurology for underlying.  She has not had a seizure in more than a year.  They are requesting that we check a vitamin D level given the use of antiepileptics to ensure that  she has adequate calcium and vitamin D supplementation.  They have also switched her from parenteral B12 to oral B12 1000 mcg p.o. daily.  She is here today to recheck a B12 level.  She also has hypothyroidism.  She is currently compliant with levothyroxine.  Aside from fatigue, she is doing extremely well but she is due to recheck a TSH.  Her flu shot is up-to-date.  At that time, my plan was: For the first time in a long time, the patient's bipolar symptoms have been well controlled for more than 6 months.  Therefore I am hesitant to make any changes in her therapy.  Continue her Lexapro combined with Zyprexa and Trileptal.  I am happy to see the patient enjoying life with overall optimism for the future.  Regarding her seizure disorder, she has been seizure-free for more than a year.  I will check a vitamin D level.  Epilepsy is currently managed by her neurologist.  Regarding her hypothyroidism, I will check a TSH to ensure a therapeutic dosage of her levothyroxine.  Regarding her vitamin B12 deficiency, I will check a vitamin B12 level to ensure that the parenteral injections have been adequately replaced by oral therapy.  Regarding her general medical care, I continue to encourage smoking cessation.  Flu shot is up-to-date.  Pap smear and colonoscopy is up-to-date.  I recommended a mammogram later this year.-  06/01/18 Patient is here today reporting worsening depression.  She states that she has no energy or desire to leave the home.  She is not interested in  doing things.  Last week she had a fleeting thought of wanting to hurt her self however she had no plan and no desire to act on those actions.  It has not occurred again.  She also states that she feels sad and tired all the time.  She feels sleepy throughout the day.  She denies any panic attacks.  She does report decreased concentration.  She denies any insomnia.  She sleeps very well on the combination of Zyprexa and trazodone.  She does report hypersomnolence but she is taking Ativan twice a day in addition to the trazodone.  She refuses to see psychiatry due to problems she has had in the past.  She denies any hallucinations.  She denies any delusions.  She denies any current plan for suicide or thoughts of suicide.  Past Medical History:  Diagnosis Date  . Anxiety   . Arthritis   . Avascular necrosis of hip ([place])    left  . Bipolar 1 disorder ([place])   . Chronic left hip pain   . Chronic nausea   . Depression   . Diverticulosis of colon   . Family hx of colon cancer    age 63-father  . Folate deficiency   . GERD (gastroesophageal reflux disease)   . Headache   . Heart murmur   . Hx of abnormal Pap smear   . Hyperlipidemia   . Hyperthyroidism   . Keratosis, actinic   . Low back pain 03/01/2013   MRI with multiple levels of disc bulge  . Panic attacks   . Polysubstance abuse ([place])   . PTSD (post-traumatic stress disorder)   . S/P colonoscopy 05/30/2010   LAX sphincter tone, anal papilla, left-sided diverticulosis, normal random biopsies,, 1 polyp-TA  . S/P endoscopy 05/30/2010   Dr Rourk-> non--critical Schatzki's ring, s/p 31F dilation  . Seizures ([place])   . Stroke [facility]) 2014   Right parietal, no deficits   .  Tubular adenoma of colon 05/30/2010   Next colonoscopy 05/2015  . UTI (urinary tract infection)    Past Surgical History:  Procedure Laterality Date  . COLONOSCOPY     every 5 years  . COLONOSCOPY  05/30/2010   RMR:lax anal sphincter tone,anal papilla,otherwise  normal/left-sided diverticula  . ESOPHAGOGASTRODUODENOSCOPY  05/30/2010   VEL:[id number] ring/small HH otherwise normal  . EXTERNAL EAR SURGERY Left 12 years ago   skin graft from behind ear put in ear canal  . JOINT REPLACEMENT    . NECK SURGERY  10 years ago  . SKIN CANCER DESTRUCTION    . 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 TOTAL HIP ARTHROPLASTY Right 05/23/2015   Procedure: RIGHT TOTAL HIP ARTHROPLASTY ANTERIOR APPROACH;  Surgeon: Gaynelle Arabian, MD;  Location: [facility];  Service: Orthopedics;  Laterality: Right;   Current Outpatient Medications on File Prior to Visit  Medication Sig Dispense Refill  . aspirin EC 81 MG tablet Take 81 mg by mouth daily.    Marland Kitchen escitalopram (LEXAPRO) 20 MG tablet TAKE (1) TABLET BY MOUTH ONCE DAILY. 30 tablet 5  . folic acid (FOLVITE) 527 MCG tablet Take by mouth daily.     Marland Kitchen levETIRAcetam (KEPPRA) 1000 MG tablet TAKE 1/2 TABLET IN THE MORNING AND 1&1/2 TABLETS IN THE EVENING. 180 tablet 1  . levothyroxine (SYNTHROID, LEVOTHROID) 50 MCG tablet TAKE ONE TABLET BY MOUTH ONCE DAILY. 30 tablet 11  . LINZESS 72 MCG capsule TAKE ONE CAPSULE ORALLY EVERY MORNING BEFORE BREAKFAST. 90 capsule 3  . LORazepam (ATIVAN) 1 MG tablet TAKE (1) TABLET BY MOUTH TWICE DAILY AS NEEDED FOR ANXIETY. 60 tablet 0  . OLANZapine (ZYPREXA) 5 MG tablet TAKE (1) TABLET BY MOUTH AT BEDTIME. 30 tablet 5  . omeprazole (PRILOSEC) 40 MG capsule TAKE (1) CAPSULE BY MOUTH TWICE DAILY. 180 capsule 3  . Oxcarbazepine (TRILEPTAL) 300 MG tablet TAKE (1) TABLET BY MOUTH TWICE DAILY. 60 tablet 5  . prochlorperazine (COMPAZINE) 10 MG tablet TAKE 1 TABLET THREE TIMES DAILY AS NEEDED FOR NAUSEA. 90 tablet 0  . SYMBICORT 160-4.5 MCG/ACT inhaler USE 2 PUFFS TWICE DAILY AS NEEDED. 10.2 g 0  . traZODone (DESYREL) 100 MG tablet TAKE (1) TABLET BY  MOUTH AT BEDTIME. 30 tablet 0  . valACYclovir (VALTREX) 500 MG tablet TAKE ONE TABLET BY MOUTH ONCE DAILY. 30 tablet 3  . vitamin B-12 (CYANOCOBALAMIN) 1000 MCG tablet Take 1,000 mcg by mouth daily.    . Vitamin D, Ergocalciferol, (DRISDOL) 50000 units CAPS capsule Take 1 capsule (50,000 Units total) by mouth every 7 (seven) days. 12 capsule 1   No current facility-administered medications on file prior to visit.    No Known Allergies Social History   Socioeconomic History  . Marital status: Divorced    Spouse name: Not on file  . Number of children: 1  . Years of education: Not on file  . Highest education level: Not on file  Occupational History  . Occupation: disabled  Social Needs  . Financial resource strain: Not on file  . Food insecurity:    Worry: Not on file    Inability: Not on file  . Transportation needs:    Medical: Not on file    Non-medical: Not on file  Tobacco Use  . Smoking status: Current Every Day Smoker    Packs/day: 0.25  Years: 30.00    Pack years: 7.50    Types: Cigarettes  . Smokeless tobacco: Never Used  . Tobacco comment: trying to quit  Substance and Sexual Activity  . Alcohol use: No    Alcohol/week: 0.0 oz  . Drug use: No    Comment: hx cocaine abuse  . Sexual activity: Never    Birth control/protection: None  Lifestyle  . Physical activity:    Days per week: Not on file    Minutes per session: Not on file  . Stress: Not on file  Relationships  . Social connections:    Talks on phone: Not on file    Gets together: Not on file    Attends religious service: Not on file    Active member of club or organization: Not on file    Attends meetings of clubs or organizations: Not on file    Relationship status: Not on file  . Intimate partner violence:    Fear of current or ex partner: Not on file    Emotionally abused: Not on file    Physically abused: Not on file    Forced sexual activity: Not on file  Other Topics Concern  . Not on  file  Social History Narrative   Patient lives at home alone and she is single.    Disabled.   Education college education.   Right handed.   Caffeine mountain dew four daily.      Review of Systems  All other systems reviewed and are negative.      Objective:   Physical Exam  Constitutional: She is oriented to person, place, and time. She appears well-developed and well-nourished. No distress.  HENT:  Right Ear: External ear normal.  Left Ear: External ear normal.  Nose: Nose normal.  Mouth/Throat: Oropharynx is clear and moist. No oropharyngeal exudate.  Neck: Neck supple. No JVD present. No thyromegaly present.  Cardiovascular: Normal rate, regular rhythm and normal heart sounds.  No murmur heard. Pulmonary/Chest: Effort normal and breath sounds normal. No respiratory distress. She has no wheezes. She has no rales.  Abdominal: Soft. Bowel sounds are normal. She exhibits no distension. There is no tenderness. There is no rebound and no guarding.  Musculoskeletal: She exhibits no edema.  Lymphadenopathy:    She has no cervical adenopathy.  Neurological: She is alert and oriented to person, place, and time. No cranial nerve deficit. She exhibits normal muscle tone. Coordination normal.  Skin: No rash noted. She is not diaphoretic. No erythema. No pallor.  Psychiatric: She has a normal mood and affect. Her behavior is normal. Judgment and thought content normal.  Vitals reviewed.         Assessment & Plan:  Bipolar 1 disorder, hypothyroidism I have recommending increasing Zyprexa to 7.5 mg at night up from 5 mg to help combat the depression associated with her bipolar disorder.  I have asked the patient to continue Lexapro 20 mg a day and also the Trileptal that she is using as a mood stabilizer.  To combat hypersomnolence during the day particular given the fact we are increasing Zyprexa I have recommended that she decrease her dose of Ativan in the morning to one half a  tablet and possibly discontinue the medication altogether if she does feel tired and sleepy throughout the day.  I will also check baseline lab work including a CBC, CMP, TSH given her hypothyroidism to ensure that this is not playing a role in her severe fatigue.  Reassess  the patient in 1 month

## 2018-06-02 LAB — COMPLETE METABOLIC PANEL WITH GFR
AG Ratio: 1.9 (calc) (ref 1.0–2.5)
ALBUMIN MSPROF: 3.9 g/dL (ref 3.6–5.1)
ALKALINE PHOSPHATASE (APISO): 117 U/L (ref 33–130)
ALT: 7 U/L (ref 6–29)
AST: 19 U/L (ref 10–35)
BUN/Creatinine Ratio: 6 (calc) (ref 6–22)
BUN: 6 mg/dL — AB (ref 7–25)
CO2: 26 mmol/L (ref 20–32)
Calcium: 8.9 mg/dL (ref 8.6–10.4)
Chloride: 105 mmol/L (ref 98–110)
Creat: 0.98 mg/dL (ref 0.50–1.05)
GFR, Est African American: 75 mL/min/{1.73_m2} (ref >=60)
GFR, Est Non African American: 64 mL/min/{1.73_m2} (ref >=60)
GLOBULIN: 2.1 g/dL (ref 1.9–3.7)
Glucose, Bld: 75 mg/dL (ref 65–99)
Potassium: 4.7 mmol/L (ref 3.5–5.3)
Sodium: 140 mmol/L (ref 135–146)
Total Bilirubin: 0.3 mg/dL (ref 0.2–1.2)
Total Protein: 6 g/dL — ABNORMAL LOW (ref 6.1–8.1)

## 2018-06-02 LAB — CBC WITH DIFFERENTIAL/PLATELET
BASOS ABS: 29 {cells}/uL (ref 0–200)
Basophils Relative: 0.4 %
EOS PCT: 1 %
Eosinophils Absolute: 72 cells/uL (ref 15–500)
HCT: 38.8 % (ref 35.0–45.0)
HEMOGLOBIN: 12.7 g/dL (ref 11.7–15.5)
LYMPHS ABS: 1894 {cells}/uL (ref 850–3900)
MCH: 26.8 pg — AB (ref 27.0–33.0)
MCHC: 32.7 g/dL (ref 32.0–36.0)
MCV: 81.9 fL (ref 80.0–100.0)
MPV: 11.5 fL (ref 7.5–12.5)
Monocytes Relative: 7.2 %
NEUTROS ABS: 4687 {cells}/uL (ref 1500–7800)
NEUTROS PCT: 65.1 %
Platelets: 260 10*3/uL (ref 140–400)
RBC: 4.74 10*6/uL (ref 3.80–5.10)
RDW: 13.9 % (ref 11.0–15.0)
Total Lymphocyte: 26.3 %
WBC mixed population: 518 cells/uL (ref 200–950)
WBC: 7.2 10*3/uL (ref 3.8–10.8)

## 2018-06-02 LAB — TSH: TSH: 1.65 mIU/L (ref 0.40–4.50)

## 2018-06-08 ENCOUNTER — Encounter: Payer: Self-pay | Admitting: Family Medicine

## 2018-06-21 ENCOUNTER — Other Ambulatory Visit: Payer: Self-pay | Admitting: Family Medicine

## 2018-06-21 ENCOUNTER — Telehealth: Payer: Self-pay | Admitting: Family Medicine

## 2018-06-21 NOTE — Telephone Encounter (Signed)
Patient called [facility] stating that she thinks the Zyprexa 7.5 is too strong for her and would like to go back to the 5mg  dose. OK to reduce mg's?

## 2018-06-21 NOTE — Telephone Encounter (Signed)
ok 

## 2018-06-22 NOTE — Telephone Encounter (Signed)
Pharmacy had sent request for the 5 mg - sent refills to pharm.

## 2018-06-23 ENCOUNTER — Other Ambulatory Visit: Payer: Self-pay | Admitting: Family Medicine

## 2018-06-23 NOTE — Telephone Encounter (Signed)
Tried to call pt no answer and no vm.

## 2018-06-25 NOTE — Telephone Encounter (Signed)
Tried to call pt no answer and no vm.

## 2018-07-12 ENCOUNTER — Other Ambulatory Visit: Payer: Self-pay | Admitting: Family Medicine

## 2018-07-12 NOTE — Telephone Encounter (Signed)
Ok to refill Lorazepam??  Last office visit 06/01/2018.  Last refill 04/12/2018.

## 2018-07-22 ENCOUNTER — Other Ambulatory Visit: Payer: Self-pay | Admitting: Family Medicine

## 2018-07-22 DIAGNOSIS — K219 Gastro-esophageal reflux disease without esophagitis: Secondary | ICD-10-CM

## 2018-08-10 ENCOUNTER — Other Ambulatory Visit: Payer: Self-pay | Admitting: Neurology

## 2018-08-10 ENCOUNTER — Other Ambulatory Visit: Payer: Self-pay | Admitting: Family Medicine

## 2018-08-10 NOTE — Telephone Encounter (Signed)
Ok to refill??  Last office visit 06/01/2018.  Last refill 07/12/2018.

## 2018-09-09 ENCOUNTER — Other Ambulatory Visit: Payer: Self-pay | Admitting: Family Medicine

## 2018-09-10 NOTE — Telephone Encounter (Signed)
Ok to refill??  Last office visit 06/01/2018.  Last refill 08/10/2018.

## 2018-09-14 ENCOUNTER — Encounter: Payer: Self-pay | Admitting: Adult Health

## 2018-09-14 ENCOUNTER — Ambulatory Visit: Payer: Medicaid Other | Admitting: Adult Health

## 2018-09-14 VITALS — BP 134/90 | HR 76 | Ht 66.25 in | Wt 188.0 lb

## 2018-09-14 DIAGNOSIS — R413 Other amnesia: Secondary | ICD-10-CM | POA: Diagnosis not present

## 2018-09-14 DIAGNOSIS — G40209 Localization-related (focal) (partial) symptomatic epilepsy and epileptic syndromes with complex partial seizures, not intractable, without status epilepticus: Secondary | ICD-10-CM | POA: Diagnosis not present

## 2018-09-14 NOTE — Progress Notes (Signed)
PATIENT: Sheryl Hall DOB: 08/18/1961  REASON FOR VISIT: follow up HISTORY FROM: patient  HISTORY OF PRESENT ILLNESS: Today 09/14/18:  Sheryl Hall is a 57 year old female with a history of seizures and memory disturbance.  She returns today for follow-up.  She denies any seizure events.  She continues on Keppra.  She feels that her memory has remained stable.  She lives at home alone.  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  She manages her own finances.  She prepares most without difficulty.  She manages her own medication and appointments.  She returns today for evaluation.  HISTORY 03/08/18 Sheryl Hall is a 57 year old female with a history of seizures.  She returns today for follow-up.  She is currently on Keppra and tolerating it well.  She states that she has not had any additional seizure events.  She reports that she was at the dentist last Wednesday and then afterwards went to her mother's house.  He states that when she was on her way home she became diaphoretic and felt lightheaded.  She states that she did not pass out and these symptoms eventually resolved.  She has not had any additional symptoms since that time.  She states that her memory has remained stable.  She lives at home alone.  All ADLs independently.  She manages her own finances.prepares her own meals.  She denies any trouble sleeping.  She denies hallucinations.  Her primary care is treating vitamin B12 and vitamin D deficiency.  She returns today for an evaluation.   REVIEW OF SYSTEMS: Out of a complete 14 system review of symptoms, the patient complains only of the following symptoms, and all other reviewed systems are negative.  ALLERGIES: No Known Allergies  HOME MEDICATIONS: Outpatient Medications Prior to Visit  Medication Sig Dispense Refill  . aspirin EC 81 MG tablet Take 81 mg by mouth daily.    Marland Kitchen escitalopram (LEXAPRO) 20 MG tablet TAKE (1) TABLET BY MOUTH  ONCE DAILY. 30 tablet 5  . folic acid (FOLVITE) 016 MCG tablet Take by mouth daily.     Marland Kitchen levETIRAcetam (KEPPRA) 1000 MG tablet TAKE 1/2 TABLET IN THE MORNING AND 1&1/2 TABLETS IN THE EVENING. 180 tablet 1  . levothyroxine (SYNTHROID, LEVOTHROID) 50 MCG tablet TAKE ONE TABLET BY MOUTH ONCE DAILY. 30 tablet 11  . LINZESS 72 MCG capsule TAKE ONE CAPSULE ORALLY EVERY MORNING BEFORE BREAKFAST. 90 capsule 3  . LORazepam (ATIVAN) 1 MG tablet TAKE (1) TABLET BY MOUTH TWICE DAILY AS NEEDED FOR ANXIETY. 60 tablet 0  . LORazepam (ATIVAN) 1 MG tablet TAKE (1) TABLET BY MOUTH TWICE DAILY AS NEEDED FOR ANXIETY. 60 tablet 0  . OLANZapine (ZYPREXA) 5 MG tablet TAKE (1) TABLET BY MOUTH AT BEDTIME. 30 tablet 3  . OLANZapine (ZYPREXA) 7.5 MG tablet Take 1 tablet (7.5 mg total) by mouth at bedtime. 30 tablet 1  . omeprazole (PRILOSEC) 40 MG capsule TAKE (1) CAPSULE BY MOUTH TWICE DAILY. 60 capsule 5  . Oxcarbazepine (TRILEPTAL) 300 MG tablet TAKE (1) TABLET BY MOUTH TWICE DAILY. 60 tablet 3  . prochlorperazine (COMPAZINE) 10 MG tablet TAKE 1 TABLET THREE TIMES DAILY AS NEEDED FOR NAUSEA. 90 tablet 0  . SYMBICORT 160-4.5 MCG/ACT inhaler USE 2 PUFFS TWICE DAILY AS NEEDED. 10.2 g 0  . traZODone (DESYREL) 100 MG tablet TAKE (1) TABLET BY MOUTH AT BEDTIME. 30 tablet 0  . traZODone (DESYREL) 100 MG tablet TAKE (1) TABLET  BY MOUTH AT BEDTIME. 30 tablet 0  . valACYclovir (VALTREX) 500 MG tablet TAKE ONE TABLET BY MOUTH ONCE DAILY. 30 tablet 3  . vitamin B-12 (CYANOCOBALAMIN) 1000 MCG tablet Take 1,000 mcg by mouth daily.    . Vitamin D, Ergocalciferol, (DRISDOL) 50000 units CAPS capsule Take 1 capsule (50,000 Units total) by mouth every 7 (seven) days. 12 capsule 1   No facility-administered medications prior to visit.     PAST MEDICAL HISTORY: Past Medical History:  Diagnosis Date  . Anxiety   . Arthritis   . Avascular necrosis of hip ([place])    left  . Bipolar 1 disorder ([place])   . Chronic left hip pain   .  Chronic nausea   . Depression   . Diverticulosis of colon   . Family hx of colon cancer    age 56-father  . Folate deficiency   . GERD (gastroesophageal reflux disease)   . Headache   . Heart murmur   . Hx of abnormal Pap smear   . Hyperlipidemia   . Hyperthyroidism   . Keratosis, actinic   . Low back pain 03/01/2013   MRI with multiple levels of disc bulge  . Panic attacks   . Polysubstance abuse ([place])   . PTSD (post-traumatic stress disorder)   . S/P colonoscopy 05/30/2010   LAX sphincter tone, anal papilla, left-sided diverticulosis, normal random biopsies,, 1 polyp-TA  . S/P endoscopy 05/30/2010   Dr Rourk-> non--critical Schatzki's ring, s/p 68F dilation  . Seizures ([place])   . Stroke [facility]) 2014   Right parietal, no deficits   . Tubular adenoma of colon 05/30/2010   Next colonoscopy 05/2015  . UTI (urinary tract infection)     PAST SURGICAL HISTORY: Past Surgical History:  Procedure Laterality Date  . COLONOSCOPY     every 5 years  . COLONOSCOPY  05/30/2010   RMR:lax anal sphincter tone,anal papilla,otherwise normal/left-sided diverticula  . ESOPHAGOGASTRODUODENOSCOPY  05/30/2010   ERD:EYCXKGYJEHU'D ring/small HH otherwise normal  . EXTERNAL EAR SURGERY Left 12 years ago   skin graft from behind ear put in ear canal  . JOINT REPLACEMENT    . NECK SURGERY  10 years ago  . SKIN CANCER DESTRUCTION    . 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 TOTAL HIP ARTHROPLASTY Right 05/23/2015   Procedure: RIGHT TOTAL HIP ARTHROPLASTY ANTERIOR APPROACH;  Surgeon: Gaynelle Arabian, MD;  Location: [facility];  Service: Orthopedics;  Laterality: Right;    FAMILY HISTORY: Family History  Problem Relation Age of Onset  . Diabetes Mother   . Hypertension Mother   . Colon cancer Father 77       living   . Hypertension Father   . Hypertension  Sister   . Heart attack Brother   . Seizures Maternal Aunt     SOCIAL HISTORY: Social History   Socioeconomic History  . Marital status: Divorced    Spouse name: Not on file  . Number of children: 1  . Years of education: Not on file  . Highest education level: Not on file  Occupational History  . Occupation: disabled  Social Needs  . Financial resource strain: Not on file  . Food insecurity:    Worry: Not on file    Inability: Not on file  . Transportation needs:    Medical: Not on file  Non-medical: Not on file  Tobacco Use  . Smoking status: Current Every Day Smoker    Packs/day: 0.25    Years: 30.00    Pack years: 7.50    Types: Cigarettes  . Smokeless tobacco: Never Used  . Tobacco comment: trying to quit  Substance and Sexual Activity  . Alcohol use: No    Alcohol/week: 0.0 standard drinks  . Drug use: No    Comment: hx cocaine abuse  . Sexual activity: Never    Birth control/protection: None  Lifestyle  . Physical activity:    Days per week: Not on file    Minutes per session: Not on file  . Stress: Not on file  Relationships  . Social connections:    Talks on phone: Not on file    Gets together: Not on file    Attends religious service: Not on file    Active member of club or organization: Not on file    Attends meetings of clubs or organizations: Not on file    Relationship status: Not on file  . Intimate partner violence:    Fear of current or ex partner: Not on file    Emotionally abused: Not on file    Physically abused: Not on file    Forced sexual activity: Not on file  Other Topics Concern  . Not on file  Social History Narrative   Patient lives at home alone and she is single.    Disabled.   Education college education.   Right handed.   Caffeine mountain dew four daily.       PHYSICAL EXAM  Vitals:   09/14/18 1327  BP: 134/90  Pulse: 76  SpO2: 95%  Weight: 188 lb (85.3 kg)  Height: 5' 6.25" (1.683 m)   Body mass index  is 29.63 kg/m.  MMSE - Mini Mental State Exam 09/14/2018 03/08/2018 09/07/2017  Orientation to time 5 5 5   Orientation to Place 5 5 5   Registration 3 3 3   Attention/ Calculation 5 1 5   Recall 3 3 3   Language- name 2 objects 2 2 2   Language- repeat 1 1 1   Language- follow 3 step command 3 3 3   Language- read & follow direction 1 1 1   Write a sentence 1 1 1   Copy design 1 1 1   Total score 30 26 30      Generalized: Well developed, in no acute distress   Neurological examination  Mentation: Alert oriented to time, place, history taking. Follows all commands speech and language fluent Cranial nerve II-XII: Pupils were equal round reactive to light. Extraocular movements were full, visual field were full on confrontational test. Facial sensation and strength were normal. Uvula tongue midline. Head turning and shoulder shrug  were normal and symmetric. Motor: The motor testing reveals 5 over 5 strength of all 4 extremities. Good symmetric motor tone is noted throughout.  Sensory: Sensory testing is intact to soft touch on all 4 extremities. No evidence of extinction is noted.  Coordination: Cerebellar testing reveals good finger-nose-finger and heel-to-shin bilaterally.  Gait and station: Gait is normal. Tandem gait is slightly unsteady. Romberg is negative. No drift is seen.  Reflexes: Deep tendon reflexes are symmetric and normal bilaterally.   DIAGNOSTIC DATA (LABS, IMAGING, TESTING) - I reviewed patient records, labs, notes, testing and imaging myself where available.  Lab Results  Component Value Date   WBC 7.2 06/01/2018   HGB 12.7 06/01/2018   HCT 38.8 06/01/2018   MCV 81.9 06/01/2018  PLT 260 06/01/2018      Component Value Date/Time   NA 140 06/01/2018 1517   NA 142 09/07/2017 1459   NA 144 10/27/2014 0625   K 4.7 06/01/2018 1517   K 4.2 10/27/2014 0625   K 4.4 08/27/2011 1314   CL 105 06/01/2018 1517   CL 107 10/27/2014 0625   CL 102 08/27/2011 1314   CO2 26  06/01/2018 1517   CO2 29 10/27/2014 0625   CO2 25 08/27/2011 1314   GLUCOSE 75 06/01/2018 1517   GLUCOSE 94 10/27/2014 0625   BUN 6 (L) 06/01/2018 1517   BUN 7 09/07/2017 1459   BUN 10 10/27/2014 0625   CREATININE 0.98 06/01/2018 1517   CALCIUM 8.9 06/01/2018 1517   CALCIUM 8.9 10/27/2014 0625   CALCIUM 9.9 08/27/2011 1314   PROT 6.0 (L) 06/01/2018 1517   PROT 6.1 09/07/2017 1459   PROT 5.9 (L) 10/27/2014 0625   ALBUMIN 4.1 09/07/2017 1459   ALBUMIN 3.3 (L) 10/27/2014 0625   AST 19 06/01/2018 1517   AST 16 10/27/2014 0625   AST 11 08/27/2011 1314   ALT 7 06/01/2018 1517   ALT 14 10/27/2014 0625   ALKPHOS 129 (H) 09/07/2017 1459   ALKPHOS 119 (H) 10/27/2014 0625   ALKPHOS 91 08/27/2011 1314   BILITOT 0.3 06/01/2018 1517   BILITOT <0.2 09/07/2017 1459   BILITOT 0.4 10/27/2014 0625   BILITOT 0.5 08/27/2011 1314   GFRNONAA 64 06/01/2018 1517   GFRAA 75 06/01/2018 1517   Lab Results  Component Value Date   CHOL 240 (H) 09/29/2016   HDL 42 (L) 09/29/2016   LDLCALC 163 (H) 09/29/2016   TRIG 175 (H) 09/29/2016   CHOLHDL 5.7 (H) 09/29/2016    Lab Results  Component Value Date   VITAMINB12 466 12/28/2017   Lab Results  Component Value Date   TSH 1.65 06/01/2018      ASSESSMENT AND PLAN 57 y.o. year old female  has a past medical history of Anxiety, Arthritis, Avascular necrosis of hip ([place]), Bipolar 1 disorder ([place]), Chronic left hip pain, Chronic nausea, Depression, Diverticulosis of colon, Family hx of colon cancer, Folate deficiency, GERD (gastroesophageal reflux disease), Headache, Heart murmur, abnormal Pap smear, Hyperlipidemia, Hyperthyroidism, Keratosis, actinic, Low back pain (03/01/2013), Panic attacks, Polysubstance abuse ([place]), PTSD (post-traumatic stress disorder), S/P colonoscopy (05/30/2010), S/P endoscopy (05/30/2010), Seizures ([place]), Stroke ([place]) (2014), Tubular adenoma of colon (05/30/2010), and UTI (urinary tract infection). here with :  1.  Memory  disturbance 2.  Seizures  Overall the patient has remained stable.  She will continue on Keppra 500 mg in the morning and 1500 mg in the evening.  Patient's memory score has remained stable.  We will continue to monitor.  She is advised that if her symptoms worsen or she develops new symptoms she should let us know.   Ward Givens, MSN, NP-C 09/14/2018, 1:30 PM [facility] [address] [place], [place] [address] [phone]

## 2018-09-14 NOTE — Progress Notes (Signed)
I have read the note, and I agree with the clinical assessment and plan.  Sheryl Hall   

## 2018-09-14 NOTE — Patient Instructions (Signed)
Your Plan:  Continue Keppra Memory score is stable If your symptoms worsen or you develop new symptoms please let us know.   Thank you for coming to see us at [facility]. I hope we have been able to provide you high quality care today.  You may receive a patient satisfaction survey over the next few weeks. We would appreciate your feedback and comments so that we may continue to improve ourselves and the health of our patients.  

## 2018-09-20 ENCOUNTER — Other Ambulatory Visit: Payer: Self-pay | Admitting: Family Medicine

## 2018-10-08 ENCOUNTER — Other Ambulatory Visit: Payer: Self-pay | Admitting: Family Medicine

## 2018-10-08 NOTE — Telephone Encounter (Signed)
Ok to refill Ativan??  Last office visit 06/01/2018.  Last refill 09/10/2018.

## 2018-10-18 ENCOUNTER — Other Ambulatory Visit: Payer: Self-pay | Admitting: Family Medicine

## 2018-10-19 ENCOUNTER — Encounter (HOSPITAL_COMMUNITY): Payer: Self-pay

## 2018-10-19 ENCOUNTER — Ambulatory Visit (HOSPITAL_COMMUNITY)
Admission: EM | Admit: 2018-10-19 | Discharge: 2018-10-19 | Disposition: A | Discharge status: Home / Self Care | Payer: Medicaid Other | Attending: Family Medicine | Admitting: Family Medicine

## 2018-10-19 DIAGNOSIS — Z7951 Long term (current) use of inhaled steroids: Secondary | ICD-10-CM | POA: Insufficient documentation

## 2018-10-19 DIAGNOSIS — Z7982 Long term (current) use of aspirin: Secondary | ICD-10-CM | POA: Insufficient documentation

## 2018-10-19 DIAGNOSIS — R21 Rash and other nonspecific skin eruption: Secondary | ICD-10-CM | POA: Diagnosis present

## 2018-10-19 DIAGNOSIS — K3184 Gastroparesis: Secondary | ICD-10-CM | POA: Insufficient documentation

## 2018-10-19 DIAGNOSIS — Z8673 Personal history of transient ischemic attack (TIA), and cerebral infarction without residual deficits: Secondary | ICD-10-CM | POA: Insufficient documentation

## 2018-10-19 DIAGNOSIS — M25552 Pain in left hip: Secondary | ICD-10-CM | POA: Diagnosis not present

## 2018-10-19 DIAGNOSIS — Z79899 Other long term (current) drug therapy: Secondary | ICD-10-CM | POA: Diagnosis not present

## 2018-10-19 DIAGNOSIS — J3489 Other specified disorders of nose and nasal sinuses: Secondary | ICD-10-CM | POA: Insufficient documentation

## 2018-10-19 DIAGNOSIS — J029 Acute pharyngitis, unspecified: Secondary | ICD-10-CM | POA: Diagnosis present

## 2018-10-19 DIAGNOSIS — E785 Hyperlipidemia, unspecified: Secondary | ICD-10-CM | POA: Insufficient documentation

## 2018-10-19 DIAGNOSIS — M1610 Unilateral primary osteoarthritis, unspecified hip: Secondary | ICD-10-CM | POA: Diagnosis not present

## 2018-10-19 DIAGNOSIS — Z85828 Personal history of other malignant neoplasm of skin: Secondary | ICD-10-CM | POA: Insufficient documentation

## 2018-10-19 DIAGNOSIS — J22 Unspecified acute lower respiratory infection: Secondary | ICD-10-CM | POA: Insufficient documentation

## 2018-10-19 DIAGNOSIS — F17210 Nicotine dependence, cigarettes, uncomplicated: Secondary | ICD-10-CM | POA: Diagnosis not present

## 2018-10-19 DIAGNOSIS — Z96643 Presence of artificial hip joint, bilateral: Secondary | ICD-10-CM | POA: Diagnosis not present

## 2018-10-19 DIAGNOSIS — F4310 Post-traumatic stress disorder, unspecified: Secondary | ICD-10-CM | POA: Diagnosis not present

## 2018-10-19 DIAGNOSIS — F319 Bipolar disorder, unspecified: Secondary | ICD-10-CM | POA: Insufficient documentation

## 2018-10-19 DIAGNOSIS — Z79890 Hormone replacement therapy (postmenopausal): Secondary | ICD-10-CM | POA: Insufficient documentation

## 2018-10-19 DIAGNOSIS — K580 Irritable bowel syndrome with diarrhea: Secondary | ICD-10-CM | POA: Insufficient documentation

## 2018-10-19 DIAGNOSIS — K219 Gastro-esophageal reflux disease without esophagitis: Secondary | ICD-10-CM | POA: Insufficient documentation

## 2018-10-19 DIAGNOSIS — Z800 Family history of malignant neoplasm of digestive organs: Secondary | ICD-10-CM | POA: Diagnosis not present

## 2018-10-19 DIAGNOSIS — E0590 Thyrotoxicosis, unspecified without thyrotoxic crisis or storm: Secondary | ICD-10-CM | POA: Insufficient documentation

## 2018-10-19 LAB — POCT RAPID STREP A: Streptococcus, Group A Screen (Direct): NEGATIVE

## 2018-10-19 MED ORDER — CETIRIZINE HCL 10 MG PO CAPS: 10.0000 mg | ORAL_CAPSULE | Freq: Every day | ORAL | 0 refills | Status: DC

## 2018-10-19 MED ORDER — PREDNISONE 50 MG PO TABS: 50.0000 mg | ORAL_TABLET | Freq: Every day | ORAL | 0 refills | Status: AC

## 2018-10-19 MED ORDER — GUAIFENESIN-CODEINE 100-10 MG/5ML PO SOLN: 5.0000 mL | Freq: Three times a day (TID) | ORAL | 0 refills | Status: DC | PRN

## 2018-10-19 MED ORDER — AZITHROMYCIN 250 MG PO TABS: 250.0000 mg | ORAL_TABLET | Freq: Every day | ORAL | 0 refills | Status: DC

## 2018-10-19 NOTE — Discharge Instructions (Signed)
Please begin azithromcyin-2 tablets today, 1 tablet for the following 4 days Please begin prednisone daily with breakfast to help with rash and inflammation in lungs Please use daily Zyrtec to help with itching related to rash as well as nasal congestion and drainage Please use Robitussin with codeine as needed for cough, do not use while working or needing to drive as this will cause drowsiness, limit use to at home or bedtime  Please monitor your symptoms and follow-up if rash, cough, sore throat not resolving or symptoms worsening, developing shortness of breath, fevers

## 2018-10-19 NOTE — ED Triage Notes (Signed)
Pt presents with a rash in different areas of body, sore throat, generalized body aches, and persistent cough.

## 2018-10-19 NOTE — ED Provider Notes (Signed)
[facility]    CSN: [id number] Arrival date & time: 10/19/18  1422     History   Chief Complaint Chief Complaint  Patient presents with  . Sore Throat  . Generalized Body Aches  . Rash    HPI Sheryl Hall is a 57 y.o. female history of anxiety, substance abuse, hyperlipidemia, presenting today for evaluation of URI symptoms and rash.  Patient states that over the past week she has had sore throat, cough and body aches as well as congestion and rhinorrhea.  Sore throat has slightly improved.  She is also noticed some soreness and increased redness around her mouth.  Over the past day she has broke out in a rash across her abdomen and trunk.  Rashes associated with itching.  Denies any new soaps, lotions, detergents or other hygiene products.  Denies any new medicines.  Patient has not taken anything for her symptoms.  Cough is been productive.  Denies wheezing or shortness of breath.  She smokes approximately half pack a day.   HPI  Past Medical History:  Diagnosis Date  . Anxiety   . Arthritis   . Avascular necrosis of hip ([place])    left  . Bipolar 1 disorder ([place])   . Chronic left hip pain   . Chronic nausea   . Depression   . Diverticulosis of colon   . Family hx of colon cancer    age 31-father  . Folate deficiency   . GERD (gastroesophageal reflux disease)   . Headache   . Heart murmur   . Hx of abnormal Pap smear   . Hyperlipidemia   . Hyperthyroidism   . Keratosis, actinic   . Low back pain 03/01/2013   MRI with multiple levels of disc bulge  . Panic attacks   . Polysubstance abuse ([place])   . PTSD (post-traumatic stress disorder)   . S/P colonoscopy 05/30/2010   LAX sphincter tone, anal papilla, left-sided diverticulosis, normal random biopsies,, 1 polyp-TA  . S/P endoscopy 05/30/2010   Dr Rourk-> non--critical Schatzki's ring, s/p 38F dilation  . Seizures ([place])   . Stroke [facility]) 2014   Right parietal, no deficits   . Tubular adenoma of colon  05/30/2010   Next colonoscopy 05/2015  . UTI (urinary tract infection)     Patient Active Problem List   Diagnosis Date Noted  . 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  . Avascular necrosis of femur head, right ([place]) 05/23/2015  . Constipation 09/20/2014  . Loose stools 08/02/2014  . Postoperative anemia due to acute blood loss 04/20/2014  . Avascular necrosis of femur head, left ([place]) 04/19/2014  . OA (osteoarthritis) of hip 04/19/2014  . Partial epilepsy with impairment of consciousness ([place]) 04/13/2014  . Pain in limb 12/19/2013  . History of stroke 12/19/2013  . Acute confusional state 03/24/2013  . Low back pain   . PTSD (post-traumatic stress disorder) 03/04/2013  . Bipolar disorder ([place]) 03/04/2013  . Anxiety   . Depression   . Stroke ([place])   . Hyperthyroidism   . Hyperlipidemia   . Folate deficiency   . Hx of abnormal Pap smear   . Abdominal bloating 11/05/2011  . IBS (irritable bowel syndrome) 11/05/2011  . Chronic diarrhea 09/18/2011  . Gastroparesis 09/18/2011  . Drug abuse ([place]) 09/18/2011  . Tubular adenoma of colon 09/18/2011  . Family hx of colon cancer 09/18/2011  . COLONIC POLYPS, ADENOMATOUS 07/08/2010  . DIZZINESS 07/08/2010  . GERD 05/16/2010  . WEIGHT LOSS 05/16/2010  .  NAUSEA WITH VOMITING 05/16/2010  . Dysphagia 05/16/2010  . DIARRHEA 05/16/2010  . ABDOMINAL PAIN, UNSPECIFIED SITE 05/16/2010    Past Surgical History:  Procedure Laterality Date  . COLONOSCOPY     every 5 years  . COLONOSCOPY  05/30/2010   RMR:lax anal sphincter tone,anal papilla,otherwise normal/left-sided diverticula  . ESOPHAGOGASTRODUODENOSCOPY  05/30/2010   DVV:[id number] ring/small HH otherwise normal  . EXTERNAL EAR SURGERY Left 12 years ago   skin graft from behind ear put in ear canal  . JOINT REPLACEMENT    . NECK SURGERY  10 years ago  . SKIN CANCER DESTRUCTION    . TOTAL HIP ARTHROPLASTY Left 04/19/2014   Procedure: LEFT TOTAL HIP ARTHROPLASTY ANTERIOR APPROACH;   Surgeon: [name], MD;  Location: [facility];  Service: Orthopedics;  Laterality: Left;  . TOTAL HIP ARTHROPLASTY Right 05/23/2015   Procedure: RIGHT TOTAL HIP ARTHROPLASTY ANTERIOR APPROACH;  Surgeon: Gaynelle Arabian, MD;  Location: [facility];  Service: Orthopedics;  Laterality: Right;    OB History    Gravida  2   Para  1   Term  1   Preterm      AB  1   Living  1     SAB  1   TAB      Ectopic      Multiple      Live Births               Home Medications    Prior to Admission medications   Medication Sig Start Date End Date Taking? Authorizing Provider  aspirin EC 81 MG tablet Take 81 mg by mouth daily.    [provider]  azithromycin (ZITHROMAX) 250 MG tablet Take 1 tablet (250 mg total) by mouth daily. Take first 2 tablets together, then 1 every day until finished. 10/19/18   Henryetta Corriveau C, PA-C  Cetirizine HCl 10 MG CAPS Take 1 capsule (10 mg total) by mouth daily for 10 days. 10/19/18 10/29/18  Gladis Soley C, PA-C  escitalopram (LEXAPRO) 20 MG tablet TAKE (1) TABLET BY MOUTH ONCE DAILY. 07/22/18   Susy Frizzle, MD  folic acid (FOLVITE) 948 MCG tablet Take by mouth daily.     [provider]  guaiFENesin-codeine 100-10 MG/5ML syrup Take 5 mLs by mouth 3 (three) times daily as needed for cough. 10/19/18   Tilla Wilborn C, PA-C  levETIRAcetam (KEPPRA) 1000 MG tablet TAKE 1/2 TABLET IN THE MORNING AND 1&1/2 TABLETS IN THE EVENING. 05/13/18   Kathrynn Ducking, MD  levothyroxine (SYNTHROID, LEVOTHROID) 50 MCG tablet TAKE ONE TABLET BY MOUTH ONCE DAILY. 10/08/18   Susy Frizzle, MD  LINZESS 72 MCG capsule TAKE ONE CAPSULE ORALLY EVERY MORNING BEFORE BREAKFAST. 05/26/18   Annitta Needs, NP  LORazepam (ATIVAN) 1 MG tablet TAKE (1) TABLET BY MOUTH TWICE DAILY AS NEEDED FOR ANXIETY. 09/10/18   Susy Frizzle, MD  LORazepam (ATIVAN) 1 MG tablet TAKE (1) TABLET BY MOUTH TWICE DAILY AS NEEDED FOR ANXIETY. 10/08/18   Susy Frizzle, MD    OLANZapine (ZYPREXA) 5 MG tablet TAKE (1) TABLET BY MOUTH AT BEDTIME. 09/21/18   Susy Frizzle, MD  omeprazole (PRILOSEC) 40 MG capsule TAKE (1) CAPSULE BY MOUTH TWICE DAILY. 07/22/18   Susy Frizzle, MD  Oxcarbazepine (TRILEPTAL) 300 MG tablet TAKE (1) TABLET BY MOUTH TWICE DAILY. 10/19/18   Susy Frizzle, MD  predniSONE (DELTASONE) 50 MG tablet Take 1 tablet (50 mg total) by mouth daily  for 5 days. 10/19/18 10/24/18  Salena Ortlieb C, PA-C  prochlorperazine (COMPAZINE) 10 MG tablet TAKE 1 TABLET THREE TIMES DAILY AS NEEDED FOR NAUSEA. 05/03/18   Susy Frizzle, MD  SYMBICORT 160-4.5 MCG/ACT inhaler USE 2 PUFFS TWICE DAILY AS NEEDED. 12/08/17   Susy Frizzle, MD  traZODone (DESYREL) 100 MG tablet TAKE (1) TABLET BY MOUTH AT BEDTIME. 07/12/18   Susy Frizzle, MD  traZODone (DESYREL) 100 MG tablet TAKE (1) TABLET BY MOUTH AT BEDTIME. 10/08/18   Susy Frizzle, MD  valACYclovir (VALTREX) 500 MG tablet TAKE ONE TABLET BY MOUTH ONCE DAILY. 02/08/18   Susy Frizzle, MD  vitamin B-12 (CYANOCOBALAMIN) 1000 MCG tablet Take 1,000 mcg by mouth daily.    [provider]  Vitamin D, Ergocalciferol, (DRISDOL) 50000 units CAPS capsule Take 1 capsule (50,000 Units total) by mouth every 7 (seven) days. 01/06/18   Susy Frizzle, MD    Family History Family History  Problem Relation Age of Onset  . Diabetes Mother   . Hypertension Mother   . Colon cancer Father 79       living   . Hypertension Father   . Hypertension Sister   . Heart attack Brother   . Seizures Maternal Aunt     Social History Social History   Tobacco Use  . Smoking status: Current Every Day Smoker    Packs/day: 0.25    Years: 30.00    Pack years: 7.50    Types: Cigarettes  . Smokeless tobacco: Never Used  . Tobacco comment: trying to quit  Substance Use Topics  . Alcohol use: No    Alcohol/week: 0.0 standard drinks  . Drug use: No    Comment: hx cocaine abuse     Allergies   Patient  has no known allergies.   Review of Systems Review of Systems  Constitutional: Negative for activity change, appetite change, chills, fatigue and fever.  HENT: Positive for congestion, rhinorrhea and sore throat. Negative for ear pain, sinus pressure and trouble swallowing.   Eyes: Negative for discharge, redness and visual disturbance.  Respiratory: Positive for cough. Negative for chest tightness and shortness of breath.   Cardiovascular: Negative for chest pain.  Gastrointestinal: Negative for abdominal pain, diarrhea, nausea and vomiting.  Musculoskeletal: Negative for arthralgias, joint swelling and myalgias.  Skin: Positive for color change and rash. Negative for wound.  Neurological: Negative for dizziness, weakness, light-headedness and headaches.     Physical Exam Triage Vital Signs ED Triage Vitals  Enc Vitals Group     BP 10/19/18 1518 (!) 120/106     Pulse Rate 10/19/18 1518 89     Resp 10/19/18 1518 18     Temp 10/19/18 1518 97.7 F (36.5 C)     Temp Source 10/19/18 1518 Oral     SpO2 10/19/18 1518 95 %     Weight --      Height --      Head Circumference --      Peak Flow --      Pain Score 10/19/18 1520 6     Pain Loc --      Pain Edu? --      Excl. in [place]? --    No data found.  Updated Vital Signs BP (!) 120/106 (BP Location: Right Arm)   Pulse 89   Temp 97.7 F (36.5 C) (Oral)   Resp 18   SpO2 95%   Visual Acuity Right Eye Distance:   Left  Eye Distance:   Bilateral Distance:    Right Eye Near:   Left Eye Near:    Bilateral Near:     Physical Exam  Constitutional: She appears well-developed and well-nourished. No distress.  HENT:  Head: Normocephalic and atraumatic.  Bilateral ears without tenderness to palpation of external auricle, tragus and mastoid, EAC's without erythema or swelling, TM's with good bony landmarks and cone of light. Non erythematous.  Oral mucosa pink and moist, no tonsillar enlargement or exudate. Posterior pharynx  patent and nonerythematous, no uvula deviation or swelling. Normal phonation.  Slight erythema around the lips and in bilateral corners of mouth  Eyes: Conjunctivae are normal.  Neck: Neck supple.  Cardiovascular: Normal rate and regular rhythm.  No murmur heard. Pulmonary/Chest: Effort normal. No respiratory distress. She has wheezes.  Mild and consistent wheezing throughout lung fields, bronchospasm and wheezing with cough  Abdominal: Soft. There is no tenderness.  Musculoskeletal: She exhibits no edema.  Neurological: She is alert.  Skin: Skin is warm and dry. Rash noted.  Abdomen with maculopapular erythematous rash to trunk, faint lesions to upper and lower extremities; no lesions on oral mucosa  Psychiatric: She has a normal mood and affect.  Nursing note and vitals reviewed.    UC Treatments / Results  Labs (all labs ordered are listed, but only abnormal results are displayed) Labs Reviewed  CULTURE, GROUP A STREP [facility])  POCT RAPID STREP A    EKG None  Radiology No results found.  Procedures Procedures (including critical care time)  Medications Ordered in UC Medications - No data to display  Initial Impression / Assessment and Plan / UC Course  I have reviewed the triage vital signs and the nursing notes.  Pertinent labs & imaging results that were available during my care of the patient were reviewed by me and considered in my medical decision making (see chart for details).     Rash appears possible allergic reaction versus heat rash versus viral given correlation with URI symptoms.  Will treat with prednisone to also help decrease inflammation in lungs.  Antihistamine Zyrtec to help with congestion drainage and itching related to rash.  Given length of symptoms will also provide azithromycin to cover atypical respiratory illness.  Provide Robitussin with codeine to use only at bedtime or at home.  Discussed drowsiness regarding this.  Delsym or Robitussin  over-the-counter during the day.  Continue to monitor rash and symptoms, follow-up if not resolving with recommendations above.Discussed strict return precautions. Patient verbalized understanding and is agreeable with plan.  Final Clinical Impressions(s) / UC Diagnoses   Final diagnoses:  Lower respiratory infection (e.g., bronchitis, pneumonia, pneumonitis, pulmonitis)     Discharge Instructions     Please begin azithromcyin-2 tablets today, 1 tablet for the following 4 days Please begin prednisone daily with breakfast to help with rash and inflammation in lungs Please use daily Zyrtec to help with itching related to rash as well as nasal congestion and drainage Please use Robitussin with codeine as needed for cough, do not use while working or needing to drive as this will cause drowsiness, limit use to at home or bedtime  Please monitor your symptoms and follow-up if rash, cough, sore throat not resolving or symptoms worsening, developing shortness of breath, fevers   ED Prescriptions    Medication Sig Dispense Auth. Provider   azithromycin (ZITHROMAX) 250 MG tablet Take 1 tablet (250 mg total) by mouth daily. Take first 2 tablets together, then 1 every day until  finished. 6 tablet Treyven Lafauci C, PA-C   predniSONE (DELTASONE) 50 MG tablet Take 1 tablet (50 mg total) by mouth daily for 5 days. 5 tablet Terrace Fontanilla C, PA-C   Cetirizine HCl 10 MG CAPS Take 1 capsule (10 mg total) by mouth daily for 10 days. 10 capsule Jamil Armwood C, PA-C   guaiFENesin-codeine 100-10 MG/5ML syrup Take 5 mLs by mouth 3 (three) times daily as needed for cough. 100 mL Caitlyne Ingham C, PA-C     Controlled Substance Prescriptions [place] Controlled Substance Registry consulted? Not Applicable   Janith Lima, [place] 10/19/18 [id number]

## 2018-10-22 LAB — CULTURE, GROUP A STREP (THRC)

## 2018-11-06 ENCOUNTER — Other Ambulatory Visit: Payer: Self-pay

## 2018-11-06 ENCOUNTER — Encounter (HOSPITAL_COMMUNITY): Payer: Self-pay | Admitting: Emergency Medicine

## 2018-11-06 ENCOUNTER — Ambulatory Visit (HOSPITAL_COMMUNITY)
Admission: EM | Admit: 2018-11-06 | Discharge: 2018-11-06 | Disposition: A | Discharge status: Home / Self Care | Payer: Medicaid Other | Attending: Family Medicine | Admitting: Family Medicine

## 2018-11-06 DIAGNOSIS — K219 Gastro-esophageal reflux disease without esophagitis: Secondary | ICD-10-CM | POA: Insufficient documentation

## 2018-11-06 MED ORDER — ALUM & MAG HYDROXIDE-SIMETH 200-200-20 MG/5ML PO SUSP
30.0000 mL | Freq: Once | ORAL | Status: AC
  Administered 2018-11-06: 30 mL via ORAL

## 2018-11-06 MED ORDER — LIDOCAINE VISCOUS HCL 2 % MT SOLN
15.0000 mL | Freq: Once | OROMUCOSAL | Status: AC
  Administered 2018-11-06: 15 mL via ORAL

## 2018-11-06 MED ORDER — LIDOCAINE VISCOUS HCL 2 % MT SOLN
OROMUCOSAL | Status: AC
  Filled 2018-11-06: qty 15

## 2018-11-06 MED ORDER — ALUM & MAG HYDROXIDE-SIMETH 200-200-20 MG/5ML PO SUSP
ORAL | Status: AC
  Filled 2018-11-06: qty 30

## 2018-11-06 NOTE — Discharge Instructions (Addendum)
Glad that the medication we gave you here is helping with your symptoms. Continue taking your omeprazole daily You could get some Maalox over-the-counter to use intermittently as needed. You can take Tylenol extra strength for your body aches Follow up as needed for continued or worsening symptoms

## 2018-11-06 NOTE — ED Triage Notes (Signed)
The patient presented to the [facility] with a complaint of a sore throat, general body aches and epigastric burning x2 days. The patient denied any fever that she has measured at home.

## 2018-11-08 ENCOUNTER — Other Ambulatory Visit: Payer: Self-pay | Admitting: Neurology

## 2018-11-08 ENCOUNTER — Other Ambulatory Visit: Payer: Self-pay | Admitting: Family Medicine

## 2018-11-08 NOTE — ED Provider Notes (Signed)
[name]    CSN: [id number] Arrival date & time: 11/06/18  1305     History   Chief Complaint Chief Complaint  Patient presents with  . Sore Throat    HPI Sheryl Hall is a 57 y.o. female.   Patient is a 57 year old female presents with sore throat, epigastric burning x2 days.  She does have a past medical history of GERD.  She takes daily omeprazole for this.  She has been taking her prescribed meds for GERD without relief. .  The symptoms are worse after she eats. She denies any cough, congestion, chest pain, shortness of breath, fevers, body aches. Denies any fever. She did have recent URI but her symptoms have resolved.  She denies any nausea, vomiting, diarrhea.  She is a current everyday smoker.  She denies any alcohol use. No recent sick contacts.   ROS per HPI      Past Medical History:  Diagnosis Date  . Anxiety   . Arthritis   . Avascular necrosis of hip ([place])    left  . Bipolar 1 disorder ([place])   . Chronic left hip pain   . Chronic nausea   . Depression   . Diverticulosis of colon   . Family hx of colon cancer    age 59-father  . Folate deficiency   . GERD (gastroesophageal reflux disease)   . Headache   . Heart murmur   . Hx of abnormal Pap smear   . Hyperlipidemia   . Hyperthyroidism   . Keratosis, actinic   . Low back pain 03/01/2013   MRI with multiple levels of disc bulge  . Panic attacks   . Polysubstance abuse ([place])   . PTSD (post-traumatic stress disorder)   . S/P colonoscopy 05/30/2010   LAX sphincter tone, anal papilla, left-sided diverticulosis, normal random biopsies,, 1 polyp-TA  . S/P endoscopy 05/30/2010   Dr Rourk-> non--critical Schatzki's ring, s/p 60F dilation  . Seizures ([place])   . Stroke [facility]) 2014   Right parietal, no deficits   . Tubular adenoma of colon 05/30/2010   Next colonoscopy 05/2015  . UTI (urinary tract infection)     Patient Active Problem List   Diagnosis Date Noted  . Recurrent cold sores  04/09/2016  . Avascular necrosis of femur head, right ([place]) 05/23/2015  . Constipation 09/20/2014  . Loose stools 08/02/2014  . Postoperative anemia due to acute blood loss 04/20/2014  . Avascular necrosis of femur head, left ([place]) 04/19/2014  . OA (osteoarthritis) of hip 04/19/2014  . Partial epilepsy with impairment of consciousness ([place]) 04/13/2014  . Pain in limb 12/19/2013  . History of stroke 12/19/2013  . Acute confusional state 03/24/2013  . Low back pain   . PTSD (post-traumatic stress disorder) 03/04/2013  . Bipolar disorder ([place]) 03/04/2013  . Anxiety   . Depression   . Stroke ([place])   . Hyperthyroidism   . Hyperlipidemia   . Folate deficiency   . Hx of abnormal Pap smear   . Abdominal bloating 11/05/2011  . IBS (irritable bowel syndrome) 11/05/2011  . Chronic diarrhea 09/18/2011  . Gastroparesis 09/18/2011  . Drug abuse ([place]) 09/18/2011  . Tubular adenoma of colon 09/18/2011  . Family hx of colon cancer 09/18/2011  . COLONIC POLYPS, ADENOMATOUS 07/08/2010  . DIZZINESS 07/08/2010  . GERD 05/16/2010  . WEIGHT LOSS 05/16/2010  . NAUSEA WITH VOMITING 05/16/2010  . Dysphagia 05/16/2010  . DIARRHEA 05/16/2010  . ABDOMINAL PAIN, UNSPECIFIED SITE 05/16/2010  Past Surgical History:  Procedure Laterality Date  . COLONOSCOPY     every 5 years  . COLONOSCOPY  05/30/2010   RMR:lax anal sphincter tone,anal papilla,otherwise normal/left-sided diverticula  . ESOPHAGOGASTRODUODENOSCOPY  05/30/2010   UJW:[id number]'O ring/small HH otherwise normal  . EXTERNAL EAR SURGERY Left 12 years ago   skin graft from behind ear put in ear canal  . JOINT REPLACEMENT    . NECK SURGERY  10 years ago  . SKIN CANCER DESTRUCTION    . 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 TOTAL HIP ARTHROPLASTY Right 05/23/2015   Procedure: RIGHT  TOTAL HIP ARTHROPLASTY ANTERIOR APPROACH;  Surgeon: Gaynelle Arabian, MD;  Location: [facility];  Service: Orthopedics;  Laterality: Right;    OB History    Gravida  2   Para  1   Term  1   Preterm      AB  1   Living  1     SAB  1   TAB      Ectopic      Multiple      Live Births               Home Medications    Prior to Admission medications   Medication Sig Start Date End Date Taking? Authorizing Provider  aspirin EC 81 MG tablet Take 81 mg by mouth daily.    [provider]  Cetirizine HCl 10 MG CAPS Take 1 capsule (10 mg total) by mouth daily for 10 days. 10/19/18 10/29/18  Wieters, Hallie C, PA-C  escitalopram (LEXAPRO) 20 MG tablet TAKE (1) TABLET BY MOUTH ONCE DAILY. 07/22/18   Susy Frizzle, MD  folic acid (FOLVITE) 130 MCG tablet Take by mouth daily.     [provider]  guaiFENesin-codeine 100-10 MG/5ML syrup Take 5 mLs by mouth 3 (three) times daily as needed for cough. 10/19/18   Wieters, Hallie C, PA-C  levETIRAcetam (KEPPRA) 1000 MG tablet TAKE 1/2 TABLET IN THE MORNING AND 1&1/2 TABLETS IN THE EVENING. 05/13/18   Kathrynn Ducking, MD  levothyroxine (SYNTHROID, LEVOTHROID) 50 MCG tablet TAKE ONE TABLET BY MOUTH ONCE DAILY. 10/08/18   Susy Frizzle, MD  LINZESS 72 MCG capsule TAKE ONE CAPSULE ORALLY EVERY MORNING BEFORE BREAKFAST. 05/26/18   Annitta Needs, NP  LORazepam (ATIVAN) 1 MG tablet TAKE (1) TABLET BY MOUTH TWICE DAILY AS NEEDED FOR ANXIETY. 09/10/18   Susy Frizzle, MD  LORazepam (ATIVAN) 1 MG tablet TAKE (1) TABLET BY MOUTH TWICE DAILY AS NEEDED FOR ANXIETY. 10/08/18   Susy Frizzle, MD  OLANZapine (ZYPREXA) 5 MG tablet TAKE (1) TABLET BY MOUTH AT BEDTIME. 09/21/18   Susy Frizzle, MD  omeprazole (PRILOSEC) 40 MG capsule TAKE (1) CAPSULE BY MOUTH TWICE DAILY. 07/22/18   Susy Frizzle, MD  Oxcarbazepine (TRILEPTAL) 300 MG tablet TAKE (1) TABLET BY MOUTH TWICE DAILY. 10/19/18   Susy Frizzle, MD    prochlorperazine (COMPAZINE) 10 MG tablet TAKE 1 TABLET THREE TIMES DAILY AS NEEDED FOR NAUSEA. 05/03/18   Susy Frizzle, MD  SYMBICORT 160-4.5 MCG/ACT inhaler USE 2 PUFFS TWICE DAILY AS NEEDED. 12/08/17   Susy Frizzle, MD  traZODone (DESYREL) 100 MG tablet TAKE (1) TABLET BY MOUTH AT BEDTIME. 07/12/18   Susy Frizzle, MD  traZODone (DESYREL) 100 MG tablet TAKE (1)  TABLET BY MOUTH AT BEDTIME. 10/08/18   Susy Frizzle, MD  valACYclovir (VALTREX) 500 MG tablet TAKE ONE TABLET BY MOUTH ONCE DAILY. 02/08/18   Susy Frizzle, MD  vitamin B-12 (CYANOCOBALAMIN) 1000 MCG tablet Take 1,000 mcg by mouth daily.    [provider]  Vitamin D, Ergocalciferol, (DRISDOL) 50000 units CAPS capsule Take 1 capsule (50,000 Units total) by mouth every 7 (seven) days. 01/06/18   Susy Frizzle, MD    Family History Family History  Problem Relation Age of Onset  . Diabetes Mother   . Hypertension Mother   . Colon cancer Father 37       living   . Hypertension Father   . Hypertension Sister   . Heart attack Brother   . Seizures Maternal Aunt     Social History Social History   Tobacco Use  . Smoking status: Current Every Day Smoker    Packs/day: 0.25    Years: 30.00    Pack years: 7.50    Types: Cigarettes  . Smokeless tobacco: Never Used  . Tobacco comment: trying to quit  Substance Use Topics  . Alcohol use: No    Alcohol/week: 0.0 standard drinks  . Drug use: No    Comment: hx cocaine abuse     Allergies   Patient has no known allergies.   Review of Systems Review of Systems   Physical Exam Triage Vital Signs ED Triage Vitals [11/06/18 1426]  Enc Vitals Group     BP 126/68     Pulse Rate 68     Resp 18     Temp 98 F (36.7 C)     Temp Source Oral     SpO2 98 %     Weight      Height      Head Circumference      Peak Flow      Pain Score 3     Pain Loc      Pain Edu?      Excl. in [place]?    No data found.  Updated Vital Signs BP 126/68 (BP  Location: Left Arm)   Pulse 68   Temp 98 F (36.7 C) (Oral)   Resp 18   SpO2 98%   Visual Acuity Right Eye Distance:   Left Eye Distance:   Bilateral Distance:    Right Eye Near:   Left Eye Near:    Bilateral Near:     Physical Exam Vitals signs and nursing note reviewed.  Constitutional:      Appearance: She is well-developed. She is not ill-appearing or toxic-appearing.  HENT:     Head: Normocephalic and atraumatic.     Right Ear: Tympanic membrane and ear canal normal.     Left Ear: Tympanic membrane and ear canal normal.     Nose: No congestion or rhinorrhea.     Mouth/Throat:     Mouth: Mucous membranes are moist.     Pharynx: No posterior oropharyngeal erythema.     Tonsils: Swelling: 0 on the right. 0 on the left.  Eyes:     Conjunctiva/sclera: Conjunctivae normal.  Neck:     Musculoskeletal: Normal range of motion.  Cardiovascular:     Rate and Rhythm: Normal rate and regular rhythm.     Heart sounds: Normal heart sounds.  Pulmonary:     Effort: Pulmonary effort is normal. No respiratory distress.     Breath sounds: Normal breath sounds. No stridor. No wheezing, rhonchi  or rales.  Chest:     Chest wall: No tenderness.  Abdominal:     General: Bowel sounds are normal. There is no distension.     Palpations: Abdomen is soft. There is no mass.     Tenderness: There is no abdominal tenderness. There is no guarding or rebound.     Hernia: No hernia is present.  Lymphadenopathy:     Cervical: No cervical adenopathy.  Skin:    General: Skin is warm and dry.  Neurological:     General: No focal deficit present.     Mental Status: She is alert.      UC Treatments / Results  Labs (all labs ordered are listed, but only abnormal results are displayed) Labs Reviewed - No data to display  EKG None  Radiology No results found.  Procedures Procedures (including critical care time)  Medications Ordered in UC Medications  alum & mag hydroxide-simeth  (MAALOX/MYLANTA) 200-200-20 MG/5ML suspension 30 mL (30 mLs Oral Given 11/06/18 1538)    And  lidocaine (XYLOCAINE) 2 % viscous mouth solution 15 mL (15 mLs Oral Given 11/06/18 1538)    Initial Impression / Assessment and Plan / UC Course  I have reviewed the triage vital signs and the nursing notes.  Pertinent labs & imaging results that were available during my care of the patient were reviewed by me and considered in my medical decision making (see chart for details).     Symptoms most consistent with GERD. GI cocktail given in clinic with relief of symptoms. Instructed to continue her omeprazole.  Follow-up with primary care provider for continued worsening symptoms. Final Clinical Impressions(s) / UC Diagnoses   Final diagnoses:  Gastroesophageal reflux disease without esophagitis     Discharge Instructions     Glad that the medication we gave you here is helping with your symptoms. Continue taking your omeprazole daily You could get some Maalox over-the-counter to use intermittently as needed. You can take Tylenol extra strength for your body aches Follow up as needed for continued or worsening symptoms       ED Prescriptions    None     Controlled Substance Prescriptions  Controlled Substance Registry consulted? Not Applicable   Orvan July, NP 11/08/18 1150

## 2018-11-08 NOTE — Telephone Encounter (Signed)
Ok to refill Ativan??  Last office visit 06/01/2018.   Last refill 10/08/2018.

## 2018-11-18 ENCOUNTER — Other Ambulatory Visit: Payer: Self-pay | Admitting: Family Medicine

## 2018-12-08 ENCOUNTER — Other Ambulatory Visit: Payer: Self-pay | Admitting: Family Medicine

## 2018-12-08 NOTE — Telephone Encounter (Signed)
Ok to refill??  Last office visit 06/01/2018.  Last refill 11/08/2018.

## 2018-12-14 ENCOUNTER — Ambulatory Visit: Payer: Medicaid Other | Admitting: Family Medicine

## 2018-12-14 ENCOUNTER — Ambulatory Visit (HOSPITAL_COMMUNITY)
Admission: RE | Admit: 2018-12-14 | Discharge: 2018-12-14 | Disposition: A | Discharge status: Home / Self Care | Payer: Medicaid Other | Source: Ambulatory Visit | Attending: Family Medicine | Admitting: Family Medicine

## 2018-12-14 ENCOUNTER — Encounter: Payer: Self-pay | Admitting: Family Medicine

## 2018-12-14 VITALS — BP 126/74 | HR 78 | Temp 97.6°F | Resp 14 | Ht 66.25 in | Wt 185.0 lb

## 2018-12-14 DIAGNOSIS — R079 Chest pain, unspecified: Secondary | ICD-10-CM

## 2018-12-14 DIAGNOSIS — K219 Gastro-esophageal reflux disease without esophagitis: Secondary | ICD-10-CM | POA: Diagnosis not present

## 2018-12-14 DIAGNOSIS — R5382 Chronic fatigue, unspecified: Secondary | ICD-10-CM | POA: Diagnosis not present

## 2018-12-14 MED ORDER — SUCRALFATE 1 G PO TABS: 1.0000 g | ORAL_TABLET | Freq: Three times a day (TID) | ORAL | 0 refills | Status: DC

## 2018-12-14 NOTE — Progress Notes (Signed)
Subjective:    Patient ID: Sheryl Hall, female    DOB: 02-26-1961, 58 y.o.   MRN: [id number]  Medication Refill   Gastroesophageal Reflux     Patient states that since November, she has had a constant burning pain underneath her sternum.  It radiates up and down from below her chin down into her stomach.  There are no exacerbating or alleviating factors.  It hurts just as much sitting up as it does lying down.  Food does not seem to make it better or worse.  Exercise does not make it better or worse.  There is no shortness of breath.  There is no hemoptysis.  There is no dysphagia or odynophagia.  There is no hematemesis.  There is no melena.  She denies any burping.  She denies any nausea vomiting or diarrhea.  She denies any hemoptysis.  She denies any shortness of breath.  She denies any pleurisy.  She does report chronic fatigue.  She denies any fevers or night sweats.  The pain is a burning sensation.  She went to an urgent care and was given a GI cocktail which temporarily helped the pain however it returned within an hour of taking the GI cocktail making me believe that the GI cocktail was calming down irritation in the esophagus Past Medical History:  Diagnosis Date  . Anxiety   . Arthritis   . 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 Bipolar 1 disorder ([place])   . Chronic left hip pain   . Chronic nausea   . Depression   . Diverticulosis of colon   . Family hx of colon cancer    age 35-father  . Folate deficiency   . GERD (gastroesophageal reflux disease)   . Headache   . Heart murmur   . Hx of abnormal Pap smear   . Hyperlipidemia   . Hyperthyroidism   . Keratosis, actinic   . Low back pain 03/01/2013   MRI with multiple levels of disc bulge  . Panic attacks   . Polysubstance abuse ([place])   . PTSD (post-traumatic stress disorder)   . S/P colonoscopy 05/30/2010   LAX sphincter tone, anal papilla, left-sided diverticulosis, normal random biopsies,, 1 polyp-TA  . S/P  endoscopy 05/30/2010   Dr Rourk-> non--critical Schatzki's ring, s/p 28F dilation  . Seizures ([place])   . Stroke [facility]) 2014   Right parietal, no deficits   . Tubular adenoma of colon 05/30/2010   Next colonoscopy 05/2015  . UTI (urinary tract infection)    Past Surgical History:  Procedure Laterality Date  . COLONOSCOPY     every 5 years  . COLONOSCOPY  05/30/2010   RMR:lax anal sphincter tone,anal papilla,otherwise normal/left-sided diverticula  . ESOPHAGOGASTRODUODENOSCOPY  05/30/2010   LKG:[id number] ring/small HH otherwise normal  . EXTERNAL EAR SURGERY Left 12 years ago   skin graft from behind ear put in ear canal  . JOINT REPLACEMENT    . NECK SURGERY  10 years ago  . SKIN CANCER DESTRUCTION    . 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 TOTAL HIP ARTHROPLASTY Right 05/23/2015   Procedure: RIGHT TOTAL HIP ARTHROPLASTY ANTERIOR APPROACH;  Surgeon: Gaynelle Arabian, MD;  Location: [facility];  Service: Orthopedics;  Laterality: Right;   Current Outpatient Medications on File Prior to Visit  Medication Sig  Dispense Refill  . aspirin EC 81 MG tablet Take 81 mg by mouth daily.    . cholecalciferol (VITAMIN D3) 25 MCG (1000 UT) tablet Take 1,000 Units by mouth daily.    Marland Kitchen escitalopram (LEXAPRO) 20 MG tablet TAKE (1) TABLET BY MOUTH ONCE DAILY. 30 tablet 5  . levETIRAcetam (KEPPRA) 1000 MG tablet TAKE 1/2 TABLET IN THE MORNING AND 1&1/2 TABLETS IN THE EVENING. 180 tablet 0  . levothyroxine (SYNTHROID, LEVOTHROID) 50 MCG tablet TAKE ONE TABLET BY MOUTH ONCE DAILY. 30 tablet 0  . LINZESS 72 MCG capsule TAKE ONE CAPSULE ORALLY EVERY MORNING BEFORE BREAKFAST. 90 capsule 3  . LORazepam (ATIVAN) 1 MG tablet TAKE (1) TABLET BY MOUTH TWICE DAILY AS NEEDED FOR ANXIETY. 60 tablet 0  . OLANZapine (ZYPREXA) 5 MG tablet TAKE (1) TABLET BY MOUTH AT BEDTIME.  30 tablet 0  . omeprazole (PRILOSEC) 40 MG capsule TAKE (1) CAPSULE BY MOUTH TWICE DAILY. 60 capsule 5  . Oxcarbazepine (TRILEPTAL) 300 MG tablet TAKE (1) TABLET BY MOUTH TWICE DAILY. 60 tablet 3  . prochlorperazine (COMPAZINE) 10 MG tablet TAKE 1 TABLET THREE TIMES DAILY AS NEEDED FOR NAUSEA. 90 tablet 0  . traZODone (DESYREL) 100 MG tablet TAKE (1) TABLET BY MOUTH AT BEDTIME. 30 tablet 0  . valACYclovir (VALTREX) 500 MG tablet TAKE ONE TABLET BY MOUTH ONCE DAILY. 30 tablet 3  . vitamin B-12 (CYANOCOBALAMIN) 1000 MCG tablet Take 1,000 mcg by mouth daily.    . Cetirizine HCl 10 MG CAPS Take 1 capsule (10 mg total) by mouth daily for 10 days. 10 capsule 0   No current facility-administered medications on file prior to visit.    No Known Allergies Social History   Socioeconomic History  . Marital status: Divorced    Spouse name: Not on file  . Number of children: 1  . Years of education: Not on file  . Highest education level: Not on file  Occupational History  . Occupation: disabled  Social Needs  . Financial resource strain: Not on file  . Food insecurity:    Worry: Not on file    Inability: Not on file  . Transportation needs:    Medical: Not on file    Non-medical: Not on file  Tobacco Use  . Smoking status: Current Every Day Smoker    Packs/day: 0.25    Years: 30.00    Pack years: 7.50    Types: Cigarettes  . Smokeless tobacco: Never Used  . Tobacco comment: trying to quit  Substance and Sexual Activity  . Alcohol use: No    Alcohol/week: 0.0 standard drinks  . Drug use: No    Comment: hx cocaine abuse  . Sexual activity: Never    Birth control/protection: None  Lifestyle  . Physical activity:    Days per week: Not on file    Minutes per session: Not on file  . Stress: Not on file  Relationships  . Social connections:    Talks on phone: Not on file    Gets together: Not on file    Attends religious service: Not on file    Active member of club or  organization: Not on file    Attends meetings of clubs or organizations: Not on file    Relationship status: Not on file  . Intimate partner violence:    Fear of current or ex partner: Not on file    Emotionally abused: Not on file    Physically abused: Not on file  Forced sexual activity: Not on file  Other Topics Concern  . Not on file  Social History Narrative   Patient lives at home alone and she is single.    Disabled.   Education college education.   Right handed.   Caffeine mountain dew four daily.      Review of Systems  All other systems reviewed and are negative.      Objective:   Physical Exam  Constitutional: She is oriented to person, place, and time. She appears well-developed and well-nourished. No distress.  HENT:  Right Ear: External ear normal.  Left Ear: External ear normal.  Nose: Nose normal.  Mouth/Throat: Oropharynx is clear and moist. No oropharyngeal exudate.  Neck: Neck supple. No JVD present. No thyromegaly present.  Cardiovascular: Normal rate, regular rhythm and normal heart sounds.  No murmur heard. Pulmonary/Chest: Effort normal and breath sounds normal. No respiratory distress. She has no wheezes. She has no rales.  Abdominal: Soft. Bowel sounds are normal. She exhibits no distension. There is no abdominal tenderness. There is no rebound and no guarding.  Musculoskeletal:        General: No edema.  Lymphadenopathy:    She has no cervical adenopathy.  Neurological: She is alert and oriented to person, place, and time. No cranial nerve deficit. She exhibits normal muscle tone. Coordination normal.  Skin: No rash noted. She is not diaphoretic. No erythema. No pallor.  Psychiatric: She has a normal mood and affect. Her behavior is normal. Judgment and thought content normal.  Vitals reviewed.         Assessment & Plan:  Chest pain due to gastrointestinal reflux disease - Plan: sucralfate (CARAFATE) 1 g tablet, DG Chest 2 View,  Ambulatory referral to Gastroenterology  Chronic fatigue - Plan: CBC with Differential/Platelet, COMPLETE METABOLIC PANEL WITH GFR, Vitamin B12, TSH  Patient has a 70-month history of burning pain in the center of her chest.  It improved with a GI cocktail.  That makes me concerned that she has esophagitis likely due to reflux disease although she has a 40-pack-year history of smoking and therefore there are other possibilities as well.  I recommended continuing Prilosec 40 mg twice daily add sucralfate 1 g p.o. q. before meals at bedtime to see if we can improve her symptoms.  If not improving on sucralfate I would recommend a GI consult for possible EGD to rule out esophagitis due to to infectious source or malignancy.  However I believe her pain is most likely esophagitis.  Given her chronic fatigue I will also check a CBC, CMP, vitamin B12, and TSH.  I have asked the patient to get a chest x-ray given her fatigue and her smoking history and her chest pain

## 2018-12-15 LAB — CBC WITH DIFFERENTIAL/PLATELET
ABSOLUTE MONOCYTES: 492 {cells}/uL (ref 200–950)
Basophils Absolute: 41 cells/uL (ref 0–200)
Basophils Relative: 0.5 %
Eosinophils Absolute: 98 cells/uL (ref 15–500)
Eosinophils Relative: 1.2 %
HCT: 40.1 % (ref 35.0–45.0)
Hemoglobin: 12.8 g/dL (ref 11.7–15.5)
Lymphs Abs: 2034 cells/uL (ref 850–3900)
MCH: 26 pg — AB (ref 27.0–33.0)
MCHC: 31.9 g/dL — AB (ref 32.0–36.0)
MCV: 81.3 fL (ref 80.0–100.0)
MPV: 11.8 fL (ref 7.5–12.5)
Monocytes Relative: 6 %
NEUTROS PCT: 67.5 %
Neutro Abs: 5535 cells/uL (ref 1500–7800)
PLATELETS: 293 10*3/uL (ref 140–400)
RBC: 4.93 10*6/uL (ref 3.80–5.10)
RDW: 14.3 % (ref 11.0–15.0)
TOTAL LYMPHOCYTE: 24.8 %
WBC: 8.2 10*3/uL (ref 3.8–10.8)

## 2018-12-15 LAB — COMPLETE METABOLIC PANEL WITH GFR
AG RATIO: 1.7 (calc) (ref 1.0–2.5)
ALT: 6 U/L (ref 6–29)
AST: 12 U/L (ref 10–35)
Albumin: 3.7 g/dL (ref 3.6–5.1)
Alkaline phosphatase (APISO): 130 U/L (ref 33–130)
BILIRUBIN TOTAL: 0.2 mg/dL (ref 0.2–1.2)
BUN/Creatinine Ratio: 6 (calc) (ref 6–22)
BUN: 6 mg/dL — ABNORMAL LOW (ref 7–25)
CALCIUM: 8.9 mg/dL (ref 8.6–10.4)
CHLORIDE: 105 mmol/L (ref 98–110)
CO2: 27 mmol/L (ref 20–32)
Creat: 1.01 mg/dL (ref 0.50–1.05)
GFR, Est African American: 72 mL/min/{1.73_m2} (ref >=60)
GFR, Est Non African American: 62 mL/min/{1.73_m2} (ref >=60)
Globulin: 2.2 g/dL (calc) (ref 1.9–3.7)
Glucose, Bld: 103 mg/dL — ABNORMAL HIGH (ref 65–99)
POTASSIUM: 4.1 mmol/L (ref 3.5–5.3)
Sodium: 142 mmol/L (ref 135–146)
Total Protein: 5.9 g/dL — ABNORMAL LOW (ref 6.1–8.1)

## 2018-12-15 LAB — VITAMIN B12: Vitamin B-12: 1089 pg/mL (ref 200–1100)

## 2018-12-15 LAB — TSH: TSH: 1.65 m[IU]/L (ref 0.40–4.50)

## 2018-12-16 ENCOUNTER — Encounter: Payer: Self-pay | Admitting: Internal Medicine

## 2018-12-20 ENCOUNTER — Other Ambulatory Visit: Payer: Self-pay | Admitting: Family Medicine

## 2018-12-27 ENCOUNTER — Encounter (HOSPITAL_COMMUNITY): Payer: Self-pay

## 2018-12-27 ENCOUNTER — Other Ambulatory Visit: Payer: Self-pay

## 2018-12-27 ENCOUNTER — Emergency Department (HOSPITAL_COMMUNITY)
Admission: EM | Admit: 2018-12-27 | Discharge: 2018-12-27 | Disposition: A | Discharge status: Home / Self Care | Payer: Medicaid Other | Attending: Emergency Medicine | Admitting: Emergency Medicine

## 2018-12-27 DIAGNOSIS — R519 Headache, unspecified: Secondary | ICD-10-CM

## 2018-12-27 DIAGNOSIS — R51 Headache: Secondary | ICD-10-CM | POA: Insufficient documentation

## 2018-12-27 DIAGNOSIS — R55 Syncope and collapse: Secondary | ICD-10-CM | POA: Diagnosis not present

## 2018-12-27 DIAGNOSIS — Z79899 Other long term (current) drug therapy: Secondary | ICD-10-CM | POA: Insufficient documentation

## 2018-12-27 DIAGNOSIS — F17210 Nicotine dependence, cigarettes, uncomplicated: Secondary | ICD-10-CM | POA: Diagnosis not present

## 2018-12-27 DIAGNOSIS — E785 Hyperlipidemia, unspecified: Secondary | ICD-10-CM | POA: Diagnosis not present

## 2018-12-27 LAB — BASIC METABOLIC PANEL
Anion gap: 7 (ref 5–15)
BUN: 5 mg/dL — AB (ref 6–20)
CALCIUM: 8.8 mg/dL — AB (ref 8.9–10.3)
CO2: 26 mmol/L (ref 22–32)
CREATININE: 1.13 mg/dL — AB (ref 0.44–1.00)
Chloride: 109 mmol/L (ref 98–111)
GFR calc non Af Amer: 54 mL/min — ABNORMAL LOW (ref >60)
Glucose, Bld: 100 mg/dL — ABNORMAL HIGH (ref 70–99)
Potassium: 4.1 mmol/L (ref 3.5–5.1)
SODIUM: 142 mmol/L (ref 135–145)

## 2018-12-27 LAB — CBC
HCT: 43.8 % (ref 36.0–46.0)
Hemoglobin: 12.9 g/dL (ref 12.0–15.0)
MCH: 25.6 pg — ABNORMAL LOW (ref 26.0–34.0)
MCHC: 29.5 g/dL — ABNORMAL LOW (ref 30.0–36.0)
MCV: 86.9 fL (ref 80.0–100.0)
NRBC: 0 % (ref 0.0–0.2)
Platelets: 272 10*3/uL (ref 150–400)
RBC: 5.04 MIL/uL (ref 3.87–5.11)
RDW: 15.6 % — AB (ref 11.5–15.5)
WBC: 6.4 10*3/uL (ref 4.0–10.5)

## 2018-12-27 LAB — URINALYSIS, ROUTINE W REFLEX MICROSCOPIC
Bacteria, UA: NONE SEEN
Bilirubin Urine: NEGATIVE
GLUCOSE, UA: NEGATIVE mg/dL
Hgb urine dipstick: NEGATIVE
KETONES UR: NEGATIVE mg/dL
NITRITE: NEGATIVE
PH: 5 (ref 5.0–8.0)
PROTEIN: 30 mg/dL — AB
Specific Gravity, Urine: 1.028 (ref 1.005–1.030)

## 2018-12-27 MED ORDER — KETOROLAC TROMETHAMINE 30 MG/ML IJ SOLN
30.0000 mg | Freq: Once | INTRAMUSCULAR | Status: AC
  Administered 2018-12-27: 30 mg via INTRAVENOUS
  Filled 2018-12-27: qty 1

## 2018-12-27 MED ORDER — METOCLOPRAMIDE HCL 5 MG/ML IJ SOLN
10.0000 mg | Freq: Once | INTRAMUSCULAR | Status: AC
  Administered 2018-12-27: 10 mg via INTRAVENOUS
  Filled 2018-12-27: qty 2

## 2018-12-27 MED ORDER — METHYLPREDNISOLONE SODIUM SUCC 125 MG IJ SOLR
125.0000 mg | Freq: Once | INTRAMUSCULAR | Status: AC
  Administered 2018-12-27: 125 mg via INTRAVENOUS
  Filled 2018-12-27: qty 2

## 2018-12-27 MED ORDER — SODIUM CHLORIDE 0.9 % IV BOLUS
1000.0000 mL | Freq: Once | INTRAVENOUS | Status: AC
  Administered 2018-12-27: 1000 mL via INTRAVENOUS

## 2018-12-27 NOTE — ED Triage Notes (Signed)
Pt reports approx 4 pm she was walking into store and started seeing stars and fell against wall. Reports she "stumbled" to car. Pt reports the feeling like she was going to pass out many times today. Pt reports she has a HA that began approx 2 pm.

## 2018-12-27 NOTE — ED Provider Notes (Signed)
[facility] EMERGENCY DEPARTMENT Provider Note   CSN: [id number] Arrival date & time: 12/27/18  3419     History   Chief Complaint Chief Complaint  Patient presents with  . Near Syncope    HPI Sheryl Hall is a 58 y.o. female.  HPI Patient states that while she was shopping a day around 4 PM she had lightheadedness and "seeing stars".  States she felt like she was going to pass out.  She also developed gradual onset frontal headache radiating to the occiput.  She denies any seizure-like activity.  No nausea or vomiting.  Currently denies lightheadedness but still has the headache.  She denies any seizure-like activity. Past Medical History:  Diagnosis Date  . Anxiety   . Arthritis   . Avascular necrosis of hip ([place])    left  . Bipolar 1 disorder ([place])   . Chronic left hip pain   . Chronic nausea   . Depression   . Diverticulosis of colon   . Family hx of colon cancer    age 88-father  . Folate deficiency   . GERD (gastroesophageal reflux disease)   . Headache   . Heart murmur   . Hx of abnormal Pap smear   . Hyperlipidemia   . Hyperthyroidism   . Keratosis, actinic   . Low back pain 03/01/2013   MRI with multiple levels of disc bulge  . Panic attacks   . Polysubstance abuse ([place])   . PTSD (post-traumatic stress disorder)   . S/P colonoscopy 05/30/2010   LAX sphincter tone, anal papilla, left-sided diverticulosis, normal random biopsies,, 1 polyp-TA  . S/P endoscopy 05/30/2010   Dr Rourk-> non--critical Schatzki's ring, s/p 83F dilation  . Seizures ([place])   . Stroke [facility]) 2014   Right parietal, no deficits   . Tubular adenoma of colon 05/30/2010   Next colonoscopy 05/2015  . UTI (urinary tract infection)     Patient Active Problem List   Diagnosis Date Noted  . 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  . Avascular necrosis of femur head, right ([place]) 05/23/2015  . Constipation 09/20/2014  . Loose stools 08/02/2014  . Postoperative anemia due to acute blood loss  04/20/2014  . Avascular necrosis of femur head, left ([place]) 04/19/2014  . OA (osteoarthritis) of hip 04/19/2014  . Partial epilepsy with impairment of consciousness ([place]) 04/13/2014  . Pain in limb 12/19/2013  . History of stroke 12/19/2013  . Acute confusional state 03/24/2013  . Low back pain   . PTSD (post-traumatic stress disorder) 03/04/2013  . Bipolar disorder ([place]) 03/04/2013  . Anxiety   . Depression   . Stroke ([place])   . Hyperthyroidism   . Hyperlipidemia   . Folate deficiency   . Hx of abnormal Pap smear   . Abdominal bloating 11/05/2011  . IBS (irritable bowel syndrome) 11/05/2011  . Chronic diarrhea 09/18/2011  . Gastroparesis 09/18/2011  . Drug abuse ([place]) 09/18/2011  . Tubular adenoma of colon 09/18/2011  . Family hx of colon cancer 09/18/2011  . COLONIC POLYPS, ADENOMATOUS 07/08/2010  . DIZZINESS 07/08/2010  . GERD 05/16/2010  . WEIGHT LOSS 05/16/2010  . NAUSEA WITH VOMITING 05/16/2010  . Dysphagia 05/16/2010  . DIARRHEA 05/16/2010  . ABDOMINAL PAIN, UNSPECIFIED SITE 05/16/2010    Past Surgical History:  Procedure Laterality Date  . COLONOSCOPY     every 5 years  . COLONOSCOPY  05/30/2010   RMR:lax anal sphincter tone,anal papilla,otherwise normal/left-sided diverticula  . ESOPHAGOGASTRODUODENOSCOPY  05/30/2010   QQI:[id number]'R ring/small HH  otherwise normal  . EXTERNAL EAR SURGERY Left 12 years ago   skin graft from behind ear put in ear canal  . JOINT REPLACEMENT    . NECK SURGERY  10 years ago  . SKIN CANCER DESTRUCTION    . 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 TOTAL HIP ARTHROPLASTY Right 05/23/2015   Procedure: RIGHT TOTAL HIP ARTHROPLASTY ANTERIOR APPROACH;  Surgeon: Gaynelle Arabian, MD;  Location: [facility];  Service: Orthopedics;  Laterality: Right;     OB History    Gravida  2   Para  1     Term  1   Preterm      AB  1   Living  1     SAB  1   TAB      Ectopic      Multiple      Live Births               Home Medications    Prior to Admission medications   Medication Sig Start Date End Date Taking? Authorizing Provider  aspirin EC 81 MG tablet Take 81 mg by mouth daily.    [provider]  Cetirizine HCl 10 MG CAPS Take 1 capsule (10 mg total) by mouth daily for 10 days. 10/19/18 10/29/18  Wieters, Hallie C, PA-C  cholecalciferol (VITAMIN D3) 25 MCG (1000 UT) tablet Take 1,000 Units by mouth daily.    [provider]  escitalopram (LEXAPRO) 20 MG tablet TAKE (1) TABLET BY MOUTH ONCE DAILY. 12/20/18   Susy Frizzle, MD  levETIRAcetam (KEPPRA) 1000 MG tablet TAKE 1/2 TABLET IN THE MORNING AND 1&1/2 TABLETS IN THE EVENING. 11/08/18   Kathrynn Ducking, MD  levothyroxine (SYNTHROID, LEVOTHROID) 50 MCG tablet TAKE ONE TABLET BY MOUTH ONCE DAILY. 12/09/18   Susy Frizzle, MD  LINZESS 72 MCG capsule TAKE ONE CAPSULE ORALLY EVERY MORNING BEFORE BREAKFAST. 05/26/18   Annitta Needs, NP  LORazepam (ATIVAN) 1 MG tablet TAKE (1) TABLET BY MOUTH TWICE DAILY AS NEEDED FOR ANXIETY. 11/08/18   Susy Frizzle, MD  OLANZapine (ZYPREXA) 5 MG tablet TAKE (1) TABLET BY MOUTH AT BEDTIME. 12/20/18   Susy Frizzle, MD  omeprazole (PRILOSEC) 40 MG capsule TAKE (1) CAPSULE BY MOUTH TWICE DAILY. 07/22/18   Susy Frizzle, MD  Oxcarbazepine (TRILEPTAL) 300 MG tablet TAKE (1) TABLET BY MOUTH TWICE DAILY. 10/19/18   Susy Frizzle, MD  prochlorperazine (COMPAZINE) 10 MG tablet TAKE 1 TABLET THREE TIMES DAILY AS NEEDED FOR NAUSEA. 05/03/18   Susy Frizzle, MD  sucralfate (CARAFATE) 1 g tablet Take 1 tablet (1 g total) by mouth 4 (four) times daily -  with meals and at bedtime. 12/14/18   Susy Frizzle, MD  traZODone (DESYREL) 100 MG tablet TAKE (1) TABLET BY MOUTH AT BEDTIME. 12/20/18   Susy Frizzle, MD  valACYclovir (VALTREX) 500 MG tablet TAKE  ONE TABLET BY MOUTH ONCE DAILY. 02/08/18   Susy Frizzle, MD  vitamin B-12 (CYANOCOBALAMIN) 1000 MCG tablet Take 1,000 mcg by mouth daily.    [provider]    Family History Family History  Problem Relation Age of Onset  . Diabetes Mother   . Hypertension Mother   . Colon cancer Father 12       living   . Hypertension Father   .  Hypertension Sister   . Heart attack Brother   . Seizures Maternal Aunt     Social History Social History   Tobacco Use  . Smoking status: Current Every Day Smoker    Packs/day: 0.50    Years: 30.00    Pack years: 15.00    Types: Cigarettes  . Smokeless tobacco: Never Used  . Tobacco comment: trying to quit  Substance Use Topics  . Alcohol use: No    Alcohol/week: 0.0 standard drinks  . Drug use: No    Comment: hx cocaine abuse     Allergies   Patient has no known allergies.   Review of Systems Review of Systems  Constitutional: Negative for chills and fever.  HENT: Negative for facial swelling, sore throat and trouble swallowing.   Eyes: Positive for visual disturbance.  Respiratory: Negative for cough and shortness of breath.   Cardiovascular: Negative for chest pain, palpitations and leg swelling.  Gastrointestinal: Negative for abdominal pain, diarrhea, nausea and vomiting.  Genitourinary: Negative for dysuria, flank pain and frequency.  Musculoskeletal: Negative for back pain, myalgias and neck pain.  Skin: Negative for rash and wound.  Neurological: Positive for dizziness, light-headedness and headaches. Negative for seizures, syncope, weakness and numbness.  All other systems reviewed and are negative.    Physical Exam Updated Vital Signs BP 127/82   Pulse 79   Temp 97.9 F (36.6 C)   Resp 11   SpO2 99%   Physical Exam Vitals signs and nursing note reviewed.  Constitutional:      General: She is not in acute distress.    Appearance: Normal appearance. She is well-developed. She is not ill-appearing.    HENT:     Head: Normocephalic and atraumatic.     Comments: No intraoral trauma    Nose: Nose normal.     Mouth/Throat:     Mouth: Mucous membranes are moist.     Pharynx: No oropharyngeal exudate or posterior oropharyngeal erythema.  Eyes:     Extraocular Movements: Extraocular movements intact.     Pupils: Pupils are equal, round, and reactive to light.  Neck:     Musculoskeletal: Normal range of motion and neck supple. No neck rigidity.  Cardiovascular:     Rate and Rhythm: Normal rate and regular rhythm.     Heart sounds: No murmur. No friction rub. No gallop.   Pulmonary:     Effort: Pulmonary effort is normal. No respiratory distress.     Breath sounds: Normal breath sounds. No stridor. No wheezing, rhonchi or rales.  Abdominal:     General: Bowel sounds are normal.     Palpations: Abdomen is soft.     Tenderness: There is no abdominal tenderness. There is no guarding or rebound.  Musculoskeletal: Normal range of motion.        General: No swelling, tenderness, deformity or signs of injury.     Right lower leg: No edema.     Left lower leg: No edema.  Lymphadenopathy:     Cervical: No cervical adenopathy.  Skin:    General: Skin is warm and dry.     Capillary Refill: Capillary refill takes less than 2 seconds.     Findings: No erythema or rash.  Neurological:     General: No focal deficit present.     Mental Status: She is alert and oriented to person, place, and time.     Comments: Patient is alert and oriented x3 with clear, goal oriented speech. Patient has  5/5 motor in all extremities. Sensation is intact to light touch. Bilateral finger-to-nose is normal with no signs of dysmetria. Patient has a normal gait and walks without assistance.  Psychiatric:        Mood and Affect: Mood normal.        Behavior: Behavior normal.      ED Treatments / Results  Labs (all labs ordered are listed, but only abnormal results are displayed) Labs Reviewed  BASIC METABOLIC  PANEL - Abnormal; Notable for the following components:      Result Value   Glucose, Bld 100 (*)    BUN 5 (*)    Creatinine, Ser 1.13 (*)    Calcium 8.8 (*)    GFR calc non Af Amer 54 (*)    All other components within normal limits  CBC - Abnormal; Notable for the following components:   MCH 25.6 (*)    MCHC 29.5 (*)    RDW 15.6 (*)    All other components within normal limits  URINALYSIS, ROUTINE W REFLEX MICROSCOPIC - Abnormal; Notable for the following components:   APPearance CLOUDY (*)    Protein, ur 30 (*)    Leukocytes, UA TRACE (*)    All other components within normal limits  CBG MONITORING, ED    EKG EKG Interpretation  Date/Time:  Monday December 27 2018 17:14:52 EST Ventricular Rate:  98 PR Interval:  158 QRS Duration: 74 QT Interval:  322 QTC Calculation: 411 R Axis:   42 Text Interpretation:  Normal sinus rhythm Normal ECG Confirmed by Julianne Rice [phone]) on 12/27/2018 6:32:06 PM   Radiology No results found.  Procedures Procedures (including critical care time)  Medications Ordered in ED Medications  ketorolac (TORADOL) 30 MG/ML injection 30 mg (30 mg Intravenous Given 12/27/18 1908)  metoCLOPramide (REGLAN) injection 10 mg (10 mg Intravenous Given 12/27/18 1908)  methylPREDNISolone sodium succinate (SOLU-MEDROL) 125 mg/2 mL injection 125 mg (125 mg Intravenous Given 12/27/18 1908)  sodium chloride 0.9 % bolus 1,000 mL (1,000 mLs Intravenous New Bag/Given 12/27/18 1908)     Initial Impression / Assessment and Plan / ED Course  I have reviewed the triage vital signs and the nursing notes.  Pertinent labs & imaging results that were available during my care of the patient were reviewed by me and considered in my medical decision making (see chart for details).     Patient's headache is resolved.  She has a normal neurologic exam.  Question whether some of her lightheadedness may be due to dehydration.  Encouraged to drink plenty of fluids.  Strict  return precautions have been given.  Final Clinical Impressions(s) / ED Diagnoses   Final diagnoses:  Near syncope  Acute nonintractable headache, unspecified headache type    ED Discharge Orders    None       Julianne Rice, MD 12/27/18 2026

## 2018-12-27 NOTE — Discharge Instructions (Addendum)
Make sure you are drinking plenty of fluids.  Change positions slowly.

## 2018-12-28 LAB — CBG MONITORING, ED: GLUCOSE-CAPILLARY: 108 mg/dL — AB (ref 70–99)

## 2019-01-06 ENCOUNTER — Other Ambulatory Visit: Payer: Self-pay | Admitting: Family Medicine

## 2019-01-07 NOTE — Telephone Encounter (Signed)
Requesting refill    Lorazepam  LOV: 12/14/18  LRF:  [id number]

## 2019-01-18 ENCOUNTER — Ambulatory Visit: Payer: Medicaid Other | Admitting: Internal Medicine

## 2019-01-18 ENCOUNTER — Other Ambulatory Visit: Payer: Self-pay | Admitting: Family Medicine

## 2019-02-07 ENCOUNTER — Other Ambulatory Visit: Payer: Self-pay | Admitting: Neurology

## 2019-02-07 ENCOUNTER — Other Ambulatory Visit: Payer: Self-pay | Admitting: Family Medicine

## 2019-02-07 NOTE — Telephone Encounter (Signed)
Ok to refill??  Last office visit 12/14/2018.  Last refill 01/07/2019.

## 2019-02-14 ENCOUNTER — Other Ambulatory Visit: Payer: Self-pay | Admitting: Family Medicine

## 2019-02-14 DIAGNOSIS — K219 Gastro-esophageal reflux disease without esophagitis: Secondary | ICD-10-CM

## 2019-03-10 ENCOUNTER — Other Ambulatory Visit: Payer: Self-pay | Admitting: Family Medicine

## 2019-03-10 NOTE — Telephone Encounter (Signed)
Ok to refill Ativan??  Last office visit 12/14/2018.  Last refill 02/07/2019.

## 2019-03-16 ENCOUNTER — Other Ambulatory Visit: Payer: Self-pay | Admitting: Family Medicine

## 2019-03-16 DIAGNOSIS — K219 Gastro-esophageal reflux disease without esophagitis: Secondary | ICD-10-CM

## 2019-03-17 ENCOUNTER — Encounter (HOSPITAL_COMMUNITY): Payer: Self-pay

## 2019-03-17 ENCOUNTER — Other Ambulatory Visit: Payer: Self-pay

## 2019-03-17 ENCOUNTER — Emergency Department (HOSPITAL_COMMUNITY)
Admission: EM | Admit: 2019-03-17 | Discharge: 2019-03-17 | Disposition: A | Discharge status: Home / Self Care | Payer: Medicaid Other | Attending: Emergency Medicine | Admitting: Emergency Medicine

## 2019-03-17 ENCOUNTER — Emergency Department (HOSPITAL_COMMUNITY): Payer: Medicaid Other

## 2019-03-17 ENCOUNTER — Ambulatory Visit (HOSPITAL_COMMUNITY)
Admission: EM | Admit: 2019-03-17 | Discharge: 2019-03-17 | Disposition: A | Discharge status: Home / Self Care | Payer: Medicaid Other | Attending: Internal Medicine | Admitting: Internal Medicine

## 2019-03-17 DIAGNOSIS — M79602 Pain in left arm: Secondary | ICD-10-CM | POA: Diagnosis present

## 2019-03-17 DIAGNOSIS — M7989 Other specified soft tissue disorders: Secondary | ICD-10-CM | POA: Diagnosis not present

## 2019-03-17 DIAGNOSIS — F17210 Nicotine dependence, cigarettes, uncomplicated: Secondary | ICD-10-CM | POA: Insufficient documentation

## 2019-03-17 DIAGNOSIS — Z79899 Other long term (current) drug therapy: Secondary | ICD-10-CM | POA: Diagnosis not present

## 2019-03-17 DIAGNOSIS — M7702 Medial epicondylitis, left elbow: Secondary | ICD-10-CM | POA: Diagnosis not present

## 2019-03-17 DIAGNOSIS — Z7982 Long term (current) use of aspirin: Secondary | ICD-10-CM | POA: Insufficient documentation

## 2019-03-17 LAB — CBC WITH DIFFERENTIAL/PLATELET
Abs Immature Granulocytes: 0.02 10*3/uL (ref 0.00–0.07)
Basophils Absolute: 0 10*3/uL (ref 0.0–0.1)
Basophils Relative: 0 %
Eosinophils Absolute: 0.1 10*3/uL (ref 0.0–0.5)
Eosinophils Relative: 2 %
HCT: 38.9 % (ref 36.0–46.0)
Hemoglobin: 12 g/dL (ref 12.0–15.0)
Immature Granulocytes: 0 %
Lymphocytes Relative: 28 %
Lymphs Abs: 2.1 10*3/uL (ref 0.7–4.0)
MCH: 26 pg (ref 26.0–34.0)
MCHC: 30.8 g/dL (ref 30.0–36.0)
MCV: 84.2 fL (ref 80.0–100.0)
Monocytes Absolute: 0.6 10*3/uL (ref 0.1–1.0)
Monocytes Relative: 8 %
Neutro Abs: 4.8 10*3/uL (ref 1.7–7.7)
Neutrophils Relative %: 62 %
Platelets: 234 10*3/uL (ref 150–400)
RBC: 4.62 MIL/uL (ref 3.87–5.11)
RDW: 15.8 % — ABNORMAL HIGH (ref 11.5–15.5)
WBC: 7.7 10*3/uL (ref 4.0–10.5)
nRBC: 0 % (ref 0.0–0.2)

## 2019-03-17 LAB — BASIC METABOLIC PANEL
Anion gap: 6 (ref 5–15)
BUN: 5 mg/dL — ABNORMAL LOW (ref 6–20)
CO2: 28 mmol/L (ref 22–32)
Calcium: 8.3 mg/dL — ABNORMAL LOW (ref 8.9–10.3)
Chloride: 105 mmol/L (ref 98–111)
Creatinine, Ser: 0.97 mg/dL (ref 0.44–1.00)
GFR calc Af Amer: >60 mL/min (ref >60)
GFR calc non Af Amer: >60 mL/min (ref >60)
Glucose, Bld: 107 mg/dL — ABNORMAL HIGH (ref 70–99)
Potassium: 3.5 mmol/L (ref 3.5–5.1)
Sodium: 139 mmol/L (ref 135–145)

## 2019-03-17 MED ORDER — PROMETHAZINE HCL 12.5 MG PO TABS
12.5000 mg | ORAL_TABLET | Freq: Once | ORAL | Status: AC
  Administered 2019-03-17: 20:00:00 12.5 mg via ORAL
  Filled 2019-03-17: qty 1

## 2019-03-17 MED ORDER — TRAMADOL HCL 50 MG PO TABS: 50.0000 mg | ORAL_TABLET | Freq: Four times a day (QID) | ORAL | 0 refills | Status: DC | PRN

## 2019-03-17 MED ORDER — NAPROXEN 375 MG PO TABS: 375.0000 mg | ORAL_TABLET | Freq: Two times a day (BID) | ORAL | 0 refills | Status: DC

## 2019-03-17 MED ORDER — KETOROLAC TROMETHAMINE 30 MG/ML IJ SOLN
30.0000 mg | Freq: Once | INTRAMUSCULAR | Status: AC
  Administered 2019-03-17: 30 mg via INTRAMUSCULAR
  Filled 2019-03-17: qty 1

## 2019-03-17 NOTE — ED Triage Notes (Signed)
Patient presents to Urgent Care with complaints of left arm pain and swelling on the anterior aspect of her elbow since yesterday. Patient states she does not have a hx of gout, is still able to move extremities normally.

## 2019-03-17 NOTE — ED Provider Notes (Signed)
Sheryl Hall    CSN: [id number] Arrival date & time: 03/17/19  1625     History   Chief Complaint Chief Complaint  Patient presents with   Arm Pain    HPI Sheryl Hall is a 58 y.o. female history of tobacco use, previous stroke, hyperlipidemia, presenting today for evaluation of left arm pain.  Patient states that over the past 1 to 2 days she has developed redness and swelling to her left arm.  She states that since symptoms began yesterday symptoms have worsened.  Denies difficulty moving elbow.  Denies pain or swelling in shoulder.  Denies any specific injury or trauma to this area that she can recall.  Denies any numbness or tingling distally.  Denies chest pain or shortness of breath.  Denies leg pain or leg swelling.  Denies previous DVT/PE.  Denies recent travel.  HPI  Past Medical History:  Diagnosis Date   Anxiety    Arthritis    Avascular necrosis of hip ([place])    left   Bipolar 1 disorder ([place])    Chronic left hip pain    Chronic nausea    Depression    Diverticulosis of colon    Family hx of colon cancer    age 7-father   Folate deficiency    GERD (gastroesophageal reflux disease)    Headache    Heart murmur    Hx of abnormal Pap smear    Hyperlipidemia    Hyperthyroidism    Keratosis, actinic    Low back pain 03/01/2013   MRI with multiple levels of disc bulge   Panic attacks    Polysubstance abuse ([place])    PTSD (post-traumatic stress disorder)    S/P colonoscopy 05/30/2010   LAX sphincter tone, anal papilla, left-sided diverticulosis, normal random biopsies,, 1 polyp-TA   S/P endoscopy 05/30/2010   Dr Rourk-> non--critical Schatzki's ring, s/p 52F dilation   Seizures ([place])    Stroke ([place]) 2014   Right parietal, no deficits    Tubular adenoma of colon 05/30/2010   Next colonoscopy 05/2015   UTI (urinary tract infection)     Patient Active Problem List   Diagnosis Date Noted   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    Avascular necrosis of femur head, right ([place]) 05/23/2015   Constipation 09/20/2014   Loose stools 08/02/2014   Postoperative anemia due to acute blood loss 04/20/2014   Avascular necrosis of femur head, left ([place]) 04/19/2014   OA (osteoarthritis) of hip 04/19/2014   Partial epilepsy with impairment of consciousness ([place]) 04/13/2014   Pain in limb 12/19/2013   History of stroke 12/19/2013   Acute confusional state 03/24/2013   Low back pain    PTSD (post-traumatic stress disorder) 03/04/2013   Bipolar disorder ([place]) 03/04/2013   Anxiety    Depression    Stroke ([place])    Hyperthyroidism    Hyperlipidemia    Folate deficiency    Hx of abnormal Pap smear    Abdominal bloating 11/05/2011   IBS (irritable bowel syndrome) 11/05/2011   Chronic diarrhea 09/18/2011   Gastroparesis 09/18/2011   Drug abuse ([place]) 09/18/2011   Tubular adenoma of colon 09/18/2011   Family hx of colon cancer 09/18/2011   COLONIC POLYPS, ADENOMATOUS 07/08/2010   DIZZINESS 07/08/2010   GERD 05/16/2010   WEIGHT LOSS 05/16/2010   NAUSEA WITH VOMITING 05/16/2010   Dysphagia 05/16/2010   DIARRHEA 05/16/2010   ABDOMINAL PAIN, UNSPECIFIED SITE 05/16/2010    Past Surgical History:  Procedure Laterality  Date   COLONOSCOPY     every 5 years   COLONOSCOPY  05/30/2010   RMR:lax anal sphincter tone,anal papilla,otherwise normal/left-sided diverticula   ESOPHAGOGASTRODUODENOSCOPY  05/30/2010   FWY:[id number] ring/small HH otherwise normal   EXTERNAL EAR SURGERY Left 12 years ago   skin graft from behind ear put in ear canal   JOINT REPLACEMENT     NECK SURGERY  10 years ago   SKIN CANCER DESTRUCTION     TOTAL HIP ARTHROPLASTY Left 04/19/2014   Procedure: LEFT TOTAL HIP ARTHROPLASTY ANTERIOR APPROACH;  Surgeon: [name], MD;  Location: [facility];  Service: Orthopedics;  Laterality: Left;   TOTAL HIP ARTHROPLASTY Right 05/23/2015   Procedure: RIGHT TOTAL HIP  ARTHROPLASTY ANTERIOR APPROACH;  Surgeon: Gaynelle Arabian, MD;  Location: [facility];  Service: Orthopedics;  Laterality: Right;    OB History    Gravida  2   Para  1   Term  1   Preterm      AB  1   Living  1     SAB  1   TAB      Ectopic      Multiple      Live Births               Home Medications    Prior to Admission medications   Medication Sig Start Date End Date Taking? Authorizing Provider  aspirin EC 81 MG tablet Take 81 mg by mouth daily.    [provider]  Cetirizine HCl 10 MG CAPS Take 1 capsule (10 mg total) by mouth daily for 10 days. 10/19/18 10/29/18  Eshika Reckart C, PA-C  cholecalciferol (VITAMIN D3) 25 MCG (1000 UT) tablet Take 1,000 Units by mouth daily.    [provider]  escitalopram (LEXAPRO) 20 MG tablet TAKE (1) TABLET BY MOUTH ONCE DAILY. 03/17/19   Susy Frizzle, MD  levETIRAcetam (KEPPRA) 1000 MG tablet TAKE 1/2 TABLET IN THE MORNING AND 1&1/2 TABLETS IN THE EVENING. 02/07/19   Kathrynn Ducking, MD  levothyroxine (SYNTHROID, LEVOTHROID) 50 MCG tablet TAKE ONE TABLET BY MOUTH ONCE DAILY. 01/07/19   Susy Frizzle, MD  LINZESS 72 MCG capsule TAKE ONE CAPSULE ORALLY EVERY MORNING BEFORE BREAKFAST. 05/26/18   Annitta Needs, NP  LORazepam (ATIVAN) 1 MG tablet TAKE (1) TABLET BY MOUTH TWICE DAILY AS NEEDED FOR ANXIETY. 03/10/19   Susy Frizzle, MD  naproxen (NAPROSYN) 375 MG tablet Take 1 tablet (375 mg total) by mouth 2 (two) times daily. 03/17/19   Lyne Khurana C, PA-C  OLANZapine (ZYPREXA) 5 MG tablet TAKE (1) TABLET BY MOUTH AT BEDTIME. 01/19/19   Susy Frizzle, MD  omeprazole (PRILOSEC) 40 MG capsule TAKE (1) CAPSULE BY MOUTH TWICE DAILY. 03/17/19   Susy Frizzle, MD  Oxcarbazepine (TRILEPTAL) 300 MG tablet TAKE (1) TABLET BY MOUTH TWICE DAILY. 02/15/19   Susy Frizzle, MD  prochlorperazine (COMPAZINE) 10 MG tablet TAKE 1 TABLET THREE TIMES DAILY AS NEEDED FOR NAUSEA. 05/03/18   Susy Frizzle, MD    sucralfate (CARAFATE) 1 g tablet Take 1 tablet (1 g total) by mouth 4 (four) times daily -  with meals and at bedtime. 12/14/18   Susy Frizzle, MD  traZODone (DESYREL) 100 MG tablet TAKE (1) TABLET BY MOUTH AT BEDTIME. 03/10/19   Susy Frizzle, MD  valACYclovir (VALTREX) 500 MG tablet TAKE ONE TABLET BY MOUTH ONCE DAILY. 02/08/18   Susy Frizzle, MD  vitamin  B-12 (CYANOCOBALAMIN) 1000 MCG tablet Take 1,000 mcg by mouth daily.    [provider]    Family History Family History  Problem Relation Age of Onset   Diabetes Mother    Hypertension Mother    Colon cancer Father 48       living    Hypertension Father    Hypertension Sister    Heart attack Brother    Seizures Maternal Aunt     Social History Social History   Tobacco Use   Smoking status: Current Every Day Smoker    Packs/day: 0.50    Years: 30.00    Pack years: 15.00    Types: Cigarettes   Smokeless tobacco: Never Used   Tobacco comment: trying to quit  Substance Use Topics   Alcohol use: No    Alcohol/week: 0.0 standard drinks   Drug use: No    Comment: hx cocaine abuse     Allergies   Patient has no known allergies.   Review of Systems Review of Systems  Constitutional: Negative for fatigue and fever.  Eyes: Negative for visual disturbance.  Respiratory: Negative for shortness of breath.   Cardiovascular: Negative for chest pain.  Gastrointestinal: Negative for abdominal pain, nausea and vomiting.  Musculoskeletal: Positive for arthralgias. Negative for joint swelling.  Skin: Positive for color change. Negative for rash and wound.  Neurological: Negative for dizziness, weakness, light-headedness and headaches.     Physical Exam Triage Vital Signs ED Triage Vitals  Enc Vitals Group     BP 03/17/19 1643 (!) 142/85     Pulse Rate 03/17/19 1643 87     Resp 03/17/19 1643 17     Temp 03/17/19 1643 98.6 F (37 C)     Temp Source 03/17/19 1643 Oral     SpO2 03/17/19  1643 99 %     Weight --      Height --      Head Circumference --      Peak Flow --      Pain Score 03/17/19 1641 7     Pain Loc --      Pain Edu? --      Excl. in [place]? --    No data found.  Updated Vital Signs BP (!) 142/85 (BP Location: Right Arm)    Pulse 87    Temp 98.6 F (37 C) (Oral)    Resp 17    SpO2 99%   Visual Acuity Right Eye Distance:   Left Eye Distance:   Bilateral Distance:    Right Eye Near:   Left Eye Near:    Bilateral Near:     Physical Exam Vitals signs and nursing note reviewed.  Constitutional:      Appearance: She is well-developed.     Comments: No acute distress  HENT:     Head: Normocephalic and atraumatic.     Nose: Nose normal.  Eyes:     Conjunctiva/sclera: Conjunctivae normal.  Neck:     Musculoskeletal: Neck supple.  Cardiovascular:     Rate and Rhythm: Normal rate.  Pulmonary:     Effort: Pulmonary effort is normal. No respiratory distress.  Abdominal:     General: There is no distension.  Musculoskeletal: Normal range of motion.     Comments: Left upper arm just proximal to antecubital area with area of faint erythema, slight warmth and mild swelling medially, tender to palpation, no obvious induration or fluctuance, does not extend posteriorly of her olecranon bursa or bony  prominences of elbow Full active range of motion of elbow  No swelling more distally, radial pulse 2+, cap refill brisk  Full active range of motion of shoulder No palpable lymphadenopathy in shoulder  Skin:    General: Skin is warm and dry.  Neurological:     Mental Status: She is alert and oriented to person, place, and time.        UC Treatments / Results  Labs (all labs ordered are listed, but only abnormal results are displayed) Labs Reviewed - No data to display  EKG None  Radiology No results found.  Procedures Procedures (including critical care time)  Medications Ordered in UC Medications - No data to display  Initial  Impression / Assessment and Plan / UC Course  I have reviewed the triage vital signs and the nursing notes.  Pertinent labs & imaging results that were available during my care of the patient were reviewed by me and considered in my medical decision making (see chart for details).     Patient with area of faint erythema and swelling just superior to antecubital area.  Erythema faint, not suggestive of cellulitis/infection at this time.  Nonsuggestive of gout given location.  Unclear injury or trauma to the area.  Patient does have history of tobacco use.  No previous DVT/PE.  Discussed possibility of DVT with patient although less likely given in upper extremity.  Recommended anti-inflammatories and warm compresses over the next 24 to 48 hours, provided Naprosyn to take twice daily.  Advised to follow-up if symptoms not improving with Naprosyn or developing worsening redness swelling or pain to area for reevaluation. Discussed strict return precautions. Patient verbalized understanding and is agreeable with plan.  Final Clinical Impressions(s) / UC Diagnoses   Final diagnoses:  Left arm swelling     Discharge Instructions     Use anti-inflammatories for pain/swelling. You may take up to 600 mg Ibuprofen every 8 hours with food. You may supplement Ibuprofen with Tylenol [phone] mg every 8 hours.  OR Naprosyn twice daily with food Warm compresses Please follow up if no improvement in 48 hours with the above or sooner if developing increased redness, swelling, pain, decreased movement in arm   ED Prescriptions    Medication Sig Dispense Auth. Provider   naproxen (NAPROSYN) 375 MG tablet Take 1 tablet (375 mg total) by mouth 2 (two) times daily. 20 tablet Nehemiah Mcfarren, [place] C, PA-C     Controlled Substance Prescriptions [place] Controlled Substance Registry consulted? Not Applicable   Janith Lima, [place] 03/17/19 [phone]

## 2019-03-17 NOTE — ED Notes (Signed)
Patient has a red, swollen area on left arm located in the antecubital area. Denies IV drugs use, or blood drawn in the area.

## 2019-03-17 NOTE — ED Triage Notes (Signed)
Pt has swelling to left arm. Started yesterday. Has gone to Urgent Care for same 2 hours ago. Stated to take Ibuprofen and Tylenol for pain. Slight redness to area. Tender to touch.

## 2019-03-17 NOTE — Discharge Instructions (Signed)
Use anti-inflammatories for pain/swelling. You may take up to 600 mg Ibuprofen every 8 hours with food. You may supplement Ibuprofen with Tylenol [phone] mg every 8 hours.  OR Naprosyn twice daily with food Warm compresses Please follow up if no improvement in 48 hours with the above or sooner if developing increased redness, swelling, pain, decreased movement in arm

## 2019-03-17 NOTE — ED Provider Notes (Addendum)
[facility] EMERGENCY DEPARTMENT Provider Note   CSN: [id number] Arrival date & time: 03/17/19  1845    History   Chief Complaint Chief Complaint  Patient presents with  . Arm Pain    HPI Sheryl Hall is a 58 y.o. female.     Patient is a 58 year old female who presents to the emergency department with a complaint of left elbow pain.  The patient states that she is not had problems with her upper extremities until 2 days ago when she started to develop some pain in the left elbow.  1 day ago she started noticing increased redness, and felt as though there was some swelling in the area.  The pain continued to get worse, and she went to urgent care today was evaluated.  At that time she was told that no acute changes were noted.  She was placed on an anti-inflammatory medication and told to return if any changes or problems.  She was told to come to the emergency department if the problems seem to be getting worse.  The patient states that by the time she got home felt as though the pain was worse.  She then became frightened that she may have a blood clot or other injury so she came to the emergency department for evaluation.  The patient denies previous DVT or PE.  She is not had any unusual chest pain, shortness of breath, or hemoptysis.  There is been no recent injury to the arm.  The patient denies IV drug use, she has not had any blood drawn from that antecubital site recently.  She has not had any procedures involving the upper extremity or chest.  She at times has some pain with certain movements of the wrist, but no injury to the wrist or fingers.  She presents now for assistance with this issue.     Past Medical History:  Diagnosis Date  . Anxiety   . Arthritis   . 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 Bipolar 1 disorder ([place])   . Chronic left hip pain   . Chronic nausea   . Depression   . Diverticulosis of colon   . Family hx of colon cancer    age 42-father   . Folate deficiency   . GERD (gastroesophageal reflux disease)   . Headache   . Heart murmur   . Hx of abnormal Pap smear   . Hyperlipidemia   . Hyperthyroidism   . Keratosis, actinic   . Low back pain 03/01/2013   MRI with multiple levels of disc bulge  . Panic attacks   . Polysubstance abuse ([place])   . PTSD (post-traumatic stress disorder)   . S/P colonoscopy 05/30/2010   LAX sphincter tone, anal papilla, left-sided diverticulosis, normal random biopsies,, 1 polyp-TA  . S/P endoscopy 05/30/2010   Dr Rourk-> non--critical Schatzki's ring, s/p 76F dilation  . Seizures ([place])   . Stroke [facility]) 2014   Right parietal, no deficits   . Tubular adenoma of colon 05/30/2010   Next colonoscopy 05/2015  . UTI (urinary tract infection)     Patient Active Problem List   Diagnosis Date Noted  . 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  . Avascular necrosis of femur head, right ([place]) 05/23/2015  . Constipation 09/20/2014  . Loose stools 08/02/2014  . Postoperative anemia due to acute blood loss 04/20/2014  . Avascular necrosis of femur head, left ([place]) 04/19/2014  . OA (osteoarthritis) of hip 04/19/2014  .  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 Pain in limb 12/19/2013  . History of stroke 12/19/2013  . Acute confusional state 03/24/2013  . Low back pain   . PTSD (post-traumatic stress disorder) 03/04/2013  . Bipolar disorder ([place]) 03/04/2013  . Anxiety   . Depression   . Stroke ([place])   . Hyperthyroidism   . Hyperlipidemia   . Folate deficiency   . Hx of abnormal Pap smear   . Abdominal bloating 11/05/2011  . IBS (irritable bowel syndrome) 11/05/2011  . Chronic diarrhea 09/18/2011  . Gastroparesis 09/18/2011  . Drug abuse ([place]) 09/18/2011  . Tubular adenoma of colon 09/18/2011  . Family hx of colon cancer 09/18/2011  . COLONIC POLYPS, ADENOMATOUS 07/08/2010  . DIZZINESS 07/08/2010  . GERD 05/16/2010  . WEIGHT LOSS 05/16/2010  . NAUSEA WITH VOMITING 05/16/2010   . Dysphagia 05/16/2010  . DIARRHEA 05/16/2010  . ABDOMINAL PAIN, UNSPECIFIED SITE 05/16/2010    Past Surgical History:  Procedure Laterality Date  . COLONOSCOPY     every 5 years  . COLONOSCOPY  05/30/2010   RMR:lax anal sphincter tone,anal papilla,otherwise normal/left-sided diverticula  . ESOPHAGOGASTRODUODENOSCOPY  05/30/2010   NKN:[id number]'T ring/small HH otherwise normal  . EXTERNAL EAR SURGERY Left 12 years ago   skin graft from behind ear put in ear canal  . JOINT REPLACEMENT    . NECK SURGERY  10 years ago  . SKIN CANCER DESTRUCTION    . 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 TOTAL HIP ARTHROPLASTY Right 05/23/2015   Procedure: RIGHT TOTAL HIP ARTHROPLASTY ANTERIOR APPROACH;  Surgeon: Gaynelle Arabian, MD;  Location: [facility]S;  Service: Orthopedics;  Laterality: Right;     OB History    Gravida  2   Para  1   Term  1   Preterm      AB  1   Living  1     SAB  1   TAB      Ectopic      Multiple      Live Births               Home Medications    Prior to Admission medications   Medication Sig Start Date End Date Taking? Authorizing Provider  aspirin EC 81 MG tablet Take 81 mg by mouth daily.    [provider]  Cetirizine HCl 10 MG CAPS Take 1 capsule (10 mg total) by mouth daily for 10 days. 10/19/18 10/29/18  Wieters, Hallie C, PA-C  cholecalciferol (VITAMIN D3) 25 MCG (1000 UT) tablet Take 1,000 Units by mouth daily.    [provider]  escitalopram (LEXAPRO) 20 MG tablet TAKE (1) TABLET BY MOUTH ONCE DAILY. 03/17/19   Susy Frizzle, MD  levETIRAcetam (KEPPRA) 1000 MG tablet TAKE 1/2 TABLET IN THE MORNING AND 1&1/2 TABLETS IN THE EVENING. 02/07/19   Kathrynn Ducking, MD  levothyroxine (SYNTHROID, LEVOTHROID) 50 MCG tablet TAKE ONE TABLET BY MOUTH ONCE DAILY. 01/07/19   Susy Frizzle, MD  LINZESS 72 MCG capsule TAKE ONE CAPSULE ORALLY EVERY MORNING BEFORE BREAKFAST. 05/26/18   Annitta Needs, NP  LORazepam (ATIVAN) 1 MG tablet TAKE (1) TABLET BY MOUTH TWICE DAILY AS NEEDED FOR ANXIETY. 03/10/19   Susy Frizzle, MD  naproxen (NAPROSYN) 375 MG tablet Take 1 tablet (375 mg total)  by mouth 2 (two) times daily. 03/17/19   Wieters, Hallie C, PA-C  OLANZapine (ZYPREXA) 5 MG tablet TAKE (1) TABLET BY MOUTH AT BEDTIME. 01/19/19   Susy Frizzle, MD  omeprazole (PRILOSEC) 40 MG capsule TAKE (1) CAPSULE BY MOUTH TWICE DAILY. 03/17/19   Susy Frizzle, MD  Oxcarbazepine (TRILEPTAL) 300 MG tablet TAKE (1) TABLET BY MOUTH TWICE DAILY. 02/15/19   Susy Frizzle, MD  prochlorperazine (COMPAZINE) 10 MG tablet TAKE 1 TABLET THREE TIMES DAILY AS NEEDED FOR NAUSEA. 05/03/18   Susy Frizzle, MD  sucralfate (CARAFATE) 1 g tablet Take 1 tablet (1 g total) by mouth 4 (four) times daily -  with meals and at bedtime. 12/14/18   Susy Frizzle, MD  traZODone (DESYREL) 100 MG tablet TAKE (1) TABLET BY MOUTH AT BEDTIME. 03/10/19   Susy Frizzle, MD  valACYclovir (VALTREX) 500 MG tablet TAKE ONE TABLET BY MOUTH ONCE DAILY. 02/08/18   Susy Frizzle, MD  vitamin B-12 (CYANOCOBALAMIN) 1000 MCG tablet Take 1,000 mcg by mouth daily.    [provider]    Family History Family History  Problem Relation Age of Onset  . Diabetes Mother   . Hypertension Mother   . Colon cancer Father 31       living   . Hypertension Father   . Hypertension Sister   . Heart attack Brother   . Seizures Maternal Aunt     Social History Social History   Tobacco Use  . Smoking status: Current Every Day Smoker    Packs/day: 0.50    Years: 30.00    Pack years: 15.00    Types: Cigarettes  . Smokeless tobacco: Never Used  . Tobacco comment: trying to quit  Substance Use Topics  . Alcohol use: No    Alcohol/week: 0.0 standard drinks  . Drug use: No    Comment: hx cocaine abuse      Allergies   Patient has no known allergies.   Review of Systems Review of Systems  Constitutional: Negative for activity change.       All ROS Neg except as noted in HPI  HENT: Negative for nosebleeds.   Eyes: Negative for photophobia and discharge.  Respiratory: Negative for cough, shortness of breath and wheezing.   Cardiovascular: Negative for chest pain and palpitations.  Gastrointestinal: Negative for abdominal pain and blood in stool.  Genitourinary: Negative for dysuria, frequency and hematuria.  Musculoskeletal: Positive for arthralgias. Negative for back pain and neck pain.  Skin: Negative.   Neurological: Negative for dizziness, seizures and speech difficulty.  Psychiatric/Behavioral: Negative for confusion and hallucinations.     Physical Exam Updated Vital Signs BP (!) 151/83 (BP Location: Right Arm)   Pulse 79   Temp 97.8 F (36.6 C) (Oral)   Resp 16   Ht 5\' 6"  (1.676 m)   Wt 86.2 kg   SpO2 96%   BMI 30.67 kg/m   Physical Exam Vitals signs and nursing note reviewed.  Constitutional:      Appearance: She is well-developed. She is not toxic-appearing.  HENT:     Head: Normocephalic.     Right Ear: Tympanic membrane and external ear normal.     Left Ear: Tympanic membrane and external ear normal.  Eyes:     General: Lids are normal.     Pupils: Pupils are equal, round, and reactive to light.  Neck:     Musculoskeletal: Normal range of motion and neck supple.  Vascular: No carotid bruit.  Cardiovascular:     Rate and Rhythm: Normal rate and regular rhythm.     Pulses: Normal pulses.     Heart sounds: Normal heart sounds.  Pulmonary:     Effort: No respiratory distress.     Breath sounds: Normal breath sounds.  Abdominal:     General: Bowel sounds are normal.     Palpations: Abdomen is soft.     Tenderness: There is no abdominal tenderness. There is no guarding.  Musculoskeletal: Normal range of motion.        General: Tenderness present.      Left elbow: She exhibits swelling. She exhibits no effusion. Tenderness found.     Comments: There is full range of motion of the left shoulder.  There is no palpable nodes in the axillary area on the left.  There are no bicep tricep nodes appreciated on the left.  There is mild increased redness over the medial epicondyle.  There is no evidence of fluctuance or induration present.  There are no red streaks appreciated.  There is no effusion noted of the olecranon area.  There are no puncture wounds or open wounds appreciated of the elbow or forearm area.  There is full range of motion of the wrist, certain movements of extension of the wrist also causes pain in the medial elbow area.  Capillary refill is less than 2 seconds.  Radial pulses 2+.  Lymphadenopathy:     Head:     Right side of head: No submandibular adenopathy.     Left side of head: No submandibular adenopathy.     Cervical: No cervical adenopathy.  Skin:    General: Skin is warm and dry.  Neurological:     Mental Status: She is alert and oriented to person, place, and time.     Cranial Nerves: No cranial nerve deficit.     Sensory: No sensory deficit.  Psychiatric:        Speech: Speech normal.      ED Treatments / Results  Labs (all labs ordered are listed, but only abnormal results are displayed) Labs Reviewed - No data to display  EKG None  Radiology No results found.  Procedures Procedures (including critical care time)  Medications Ordered in ED Medications - No data to display   Initial Impression / Assessment and Plan / ED Course  I have reviewed the triage vital signs and the nursing notes.  Pertinent labs & imaging results that were available during my care of the patient were reviewed by me and considered in my medical decision making (see chart for details).          Final Clinical Impressions(s) / ED Diagnoses MDM  Vital signs reviewed.  Pulse oximetry is within normal limits by my  interpretation at 96% on room air.  The patient states that she is noticed this redness over the last 24 to 48hours, accompanied by pain.  She is not left-hand-dominant.  She does not recall doing any excessive lifting, pushing, or pulling.  She is not had injury to the left upper extremity.  She denies any blood draws from this area, and she denies any use of IV drugs.  No fever or chills reported.  Complete blood count is well within normal limits. X-ray of the elbow shows some osteopenia present, but no soft tissue swelling.  No displaced fracture, and no radiopaque foreign body.  No joint effusion.  The examination favors medial epicondylitis.  Patient  will given a sling to use.  Patient is asked to continue the anti-inflammatory medications given.  Patient is asked to see her primary physician or return to the emergency department if not improving.   Final diagnoses:  Medial epicondylitis of elbow, left    ED Discharge Orders         Ordered    traMADol (ULTRAM) 50 MG tablet  Every 6 hours PRN     03/17/19 2121           Lily Kocher, PA-C 03/17/19 2122    Lily Kocher, PA-C 03/17/19 2138    Francine Graven, DO 03/21/19 [phone]

## 2019-03-17 NOTE — Discharge Instructions (Addendum)
The x-ray of your elbow is negative for fracture, fluid in the joint, or dislocation.  Your complete blood count is negative for signs of infection or other problem.  Your x-ray does show some mild thinning of the bone.  Please speak with your primary physician about possible bone density testing.  The pain and redness is most likely a result of a condition called medial epicondylitis.  This is an inflammation of tendons associated with your elbow up to your wrist.  Your examination suggest a tendinitis in this area.  Please use the sling over the next 4 or 5days.  You do not need to sleep in this device.  Please take the anti-inflammatory medications ordered by the physician at the urgent care.  You may use Ultram for more severe pain. This medication may cause drowsiness. Please do not drink, drive, or participate in activity that requires concentration while taking this medication.

## 2019-04-07 ENCOUNTER — Other Ambulatory Visit: Payer: Self-pay | Admitting: Family Medicine

## 2019-04-07 NOTE — Telephone Encounter (Signed)
Ok to refill??  Last office visit 12/14/2018.  Last refill 03/10/2019.

## 2019-04-19 ENCOUNTER — Other Ambulatory Visit: Payer: Self-pay | Admitting: Family Medicine

## 2019-04-27 ENCOUNTER — Other Ambulatory Visit: Payer: Self-pay

## 2019-04-27 ENCOUNTER — Ambulatory Visit: Payer: Medicaid Other | Admitting: Family Medicine

## 2019-05-02 ENCOUNTER — Other Ambulatory Visit: Payer: Self-pay

## 2019-05-02 ENCOUNTER — Encounter (HOSPITAL_COMMUNITY): Payer: Self-pay | Admitting: Emergency Medicine

## 2019-05-02 ENCOUNTER — Emergency Department (HOSPITAL_COMMUNITY): Payer: Medicaid Other

## 2019-05-02 ENCOUNTER — Emergency Department (HOSPITAL_COMMUNITY)
Admission: EM | Admit: 2019-05-02 | Discharge: 2019-05-02 | Disposition: A | Discharge status: Home / Self Care | Payer: Medicaid Other | Attending: Emergency Medicine | Admitting: Emergency Medicine

## 2019-05-02 DIAGNOSIS — Z20828 Contact with and (suspected) exposure to other viral communicable diseases: Secondary | ICD-10-CM | POA: Insufficient documentation

## 2019-05-02 DIAGNOSIS — E860 Dehydration: Secondary | ICD-10-CM | POA: Insufficient documentation

## 2019-05-02 DIAGNOSIS — Z7982 Long term (current) use of aspirin: Secondary | ICD-10-CM | POA: Diagnosis not present

## 2019-05-02 DIAGNOSIS — Z79899 Other long term (current) drug therapy: Secondary | ICD-10-CM | POA: Diagnosis not present

## 2019-05-02 DIAGNOSIS — R51 Headache: Secondary | ICD-10-CM | POA: Diagnosis present

## 2019-05-02 DIAGNOSIS — B349 Viral infection, unspecified: Secondary | ICD-10-CM | POA: Diagnosis not present

## 2019-05-02 DIAGNOSIS — E785 Hyperlipidemia, unspecified: Secondary | ICD-10-CM | POA: Insufficient documentation

## 2019-05-02 DIAGNOSIS — Z20822 Contact with and (suspected) exposure to covid-19: Secondary | ICD-10-CM

## 2019-05-02 DIAGNOSIS — F17210 Nicotine dependence, cigarettes, uncomplicated: Secondary | ICD-10-CM | POA: Diagnosis not present

## 2019-05-02 DIAGNOSIS — M791 Myalgia, unspecified site: Secondary | ICD-10-CM

## 2019-05-02 LAB — CBC WITH DIFFERENTIAL/PLATELET
Abs Immature Granulocytes: 0.02 10*3/uL (ref 0.00–0.07)
Basophils Absolute: 0 10*3/uL (ref 0.0–0.1)
Basophils Relative: 1 %
Eosinophils Absolute: 0.1 10*3/uL (ref 0.0–0.5)
Eosinophils Relative: 2 %
HCT: 41.2 % (ref 36.0–46.0)
Hemoglobin: 12.2 g/dL (ref 12.0–15.0)
Immature Granulocytes: 0 %
Lymphocytes Relative: 34 %
Lymphs Abs: 2.2 10*3/uL (ref 0.7–4.0)
MCH: 24.9 pg — ABNORMAL LOW (ref 26.0–34.0)
MCHC: 29.6 g/dL — ABNORMAL LOW (ref 30.0–36.0)
MCV: 84.1 fL (ref 80.0–100.0)
Monocytes Absolute: 0.5 10*3/uL (ref 0.1–1.0)
Monocytes Relative: 8 %
Neutro Abs: 3.5 10*3/uL (ref 1.7–7.7)
Neutrophils Relative %: 55 %
Platelets: 260 10*3/uL (ref 150–400)
RBC: 4.9 MIL/uL (ref 3.87–5.11)
RDW: 15.5 % (ref 11.5–15.5)
WBC: 6.3 10*3/uL (ref 4.0–10.5)
nRBC: 0 % (ref 0.0–0.2)

## 2019-05-02 LAB — COMPREHENSIVE METABOLIC PANEL
ALT: 9 U/L (ref 0–44)
AST: 15 U/L (ref 15–41)
Albumin: 3.8 g/dL (ref 3.5–5.0)
Alkaline Phosphatase: 115 U/L (ref 38–126)
Anion gap: 12 (ref 5–15)
BUN: <5 mg/dL — ABNORMAL LOW (ref 6–20)
CO2: 26 mmol/L (ref 22–32)
Calcium: 8.6 mg/dL — ABNORMAL LOW (ref 8.9–10.3)
Chloride: 101 mmol/L (ref 98–111)
Creatinine, Ser: 0.89 mg/dL (ref 0.44–1.00)
GFR calc Af Amer: >60 mL/min (ref >60)
GFR calc non Af Amer: >60 mL/min (ref >60)
Glucose, Bld: 106 mg/dL — ABNORMAL HIGH (ref 70–99)
Potassium: 3.2 mmol/L — ABNORMAL LOW (ref 3.5–5.1)
Sodium: 139 mmol/L (ref 135–145)
Total Bilirubin: 0.5 mg/dL (ref 0.3–1.2)
Total Protein: 6.6 g/dL (ref 6.5–8.1)

## 2019-05-02 LAB — URINALYSIS, ROUTINE W REFLEX MICROSCOPIC
Bilirubin Urine: NEGATIVE
Glucose, UA: NEGATIVE mg/dL
Hgb urine dipstick: NEGATIVE
Ketones, ur: NEGATIVE mg/dL
Leukocytes,Ua: NEGATIVE
Nitrite: NEGATIVE
Protein, ur: NEGATIVE mg/dL
Specific Gravity, Urine: 1.013 (ref 1.005–1.030)
pH: 5 (ref 5.0–8.0)

## 2019-05-02 LAB — TSH: TSH: 2.226 u[IU]/mL (ref 0.350–4.500)

## 2019-05-02 LAB — SARS CORONAVIRUS 2 BY RT PCR (HOSPITAL ORDER, PERFORMED IN ~~LOC~~ HOSPITAL LAB): SARS Coronavirus 2: NEGATIVE

## 2019-05-02 MED ORDER — KETOROLAC TROMETHAMINE 30 MG/ML IJ SOLN
30.0000 mg | Freq: Once | INTRAMUSCULAR | Status: AC
  Administered 2019-05-02: 30 mg via INTRAVENOUS
  Filled 2019-05-02: qty 1

## 2019-05-02 MED ORDER — SODIUM CHLORIDE 0.9 % IV BOLUS
1000.0000 mL | Freq: Once | INTRAVENOUS | Status: AC
  Administered 2019-05-02: 1000 mL via INTRAVENOUS

## 2019-05-02 MED ORDER — IBUPROFEN 600 MG PO TABS: 600.0000 mg | ORAL_TABLET | Freq: Four times a day (QID) | ORAL | 0 refills | Status: DC | PRN

## 2019-05-02 MED ORDER — HYDROCODONE-ACETAMINOPHEN 5-325 MG PO TABS: 1.0000 | ORAL_TABLET | ORAL | 0 refills | Status: DC | PRN

## 2019-05-02 MED ORDER — ONDANSETRON HCL 4 MG/2ML IJ SOLN
4.0000 mg | Freq: Once | INTRAMUSCULAR | Status: AC
  Administered 2019-05-02: 4 mg via INTRAVENOUS
  Filled 2019-05-02: qty 2

## 2019-05-02 NOTE — ED Provider Notes (Signed)
[facility] EMERGENCY DEPARTMENT Provider Note   CSN: [id number] Arrival date & time: 05/02/19  1546    History   Chief Complaint Chief Complaint  Patient presents with  . Generalized Body Aches    HPI EMARI Sheryl Hall is a 58 y.o. female.     Pt presents to the ED today with a headache, muscle aches, cough, sob, and weakness.  The pt said she said she went to urgent care a week ago and was diagnosed with bronchitis.  She was given a rx for levaquin and she took it.  She has not improved.  She said she can't get off the couch.  She denies any known covid exposures.     Past Medical History:  Diagnosis Date  . Anxiety   . Arthritis   . Avascular necrosis of hip ([place])    left  . Bipolar 1 disorder ([place])   . Chronic left hip pain   . Chronic nausea   . Depression   . Diverticulosis of colon   . Family hx of colon cancer    age 26-father  . Folate deficiency   . GERD (gastroesophageal reflux disease)   . Headache   . Heart murmur   . Hx of abnormal Pap smear   . Hyperlipidemia   . Hyperthyroidism   . Keratosis, actinic   . Low back pain 03/01/2013   MRI with multiple levels of disc bulge  . Panic attacks   . Polysubstance abuse ([place])   . PTSD (post-traumatic stress disorder)   . S/P colonoscopy 05/30/2010   LAX sphincter tone, anal papilla, left-sided diverticulosis, normal random biopsies,, 1 polyp-TA  . S/P endoscopy 05/30/2010   Dr Rourk-> non--critical Schatzki's ring, s/p 24F dilation  . Seizures ([place])   . Stroke [facility]) 2014   Right parietal, no deficits   . Tubular adenoma of colon 05/30/2010   Next colonoscopy 05/2015  . UTI (urinary tract infection)     Patient Active Problem List   Diagnosis Date Noted  . 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  . Avascular necrosis of femur head, right ([place]) 05/23/2015  . Constipation 09/20/2014  . Loose stools 08/02/2014  . Postoperative anemia due to acute blood loss 04/20/2014  . Avascular necrosis of femur head, left ([place])  04/19/2014  . OA (osteoarthritis) of hip 04/19/2014  . Partial epilepsy with impairment of consciousness ([place]) 04/13/2014  . Pain in limb 12/19/2013  . History of stroke 12/19/2013  . Acute confusional state 03/24/2013  . Low back pain   . PTSD (post-traumatic stress disorder) 03/04/2013  . Bipolar disorder ([employer]) 03/04/2013  . Anxiety   . Depression   . Stroke ([place])   . Hyperthyroidism   . Hyperlipidemia   . Folate deficiency   . Hx of abnormal Pap smear   . Abdominal bloating 11/05/2011  . IBS (irritable bowel syndrome) 11/05/2011  . Chronic diarrhea 09/18/2011  . Gastroparesis 09/18/2011  . Drug abuse ([place]) 09/18/2011  . Tubular adenoma of colon 09/18/2011  . Family hx of colon cancer 09/18/2011  . COLONIC POLYPS, ADENOMATOUS 07/08/2010  . DIZZINESS 07/08/2010  . GERD 05/16/2010  . WEIGHT LOSS 05/16/2010  . NAUSEA WITH VOMITING 05/16/2010  . Dysphagia 05/16/2010  . DIARRHEA 05/16/2010  . ABDOMINAL PAIN, UNSPECIFIED SITE 05/16/2010    Past Surgical History:  Procedure Laterality Date  . COLONOSCOPY     every 5 years  . COLONOSCOPY  05/30/2010   RMR:lax anal sphincter tone,anal papilla,otherwise normal/left-sided diverticula  . ESOPHAGOGASTRODUODENOSCOPY  05/30/2010   VOJ:[id number] ring/small HH otherwise normal  . EXTERNAL EAR SURGERY Left 12 years ago   skin graft from behind ear put in ear canal  . JOINT REPLACEMENT    . NECK SURGERY  10 years ago  . SKIN CANCER DESTRUCTION    . 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 TOTAL HIP ARTHROPLASTY Right 05/23/2015   Procedure: RIGHT TOTAL HIP ARTHROPLASTY ANTERIOR APPROACH;  Surgeon: Gaynelle Arabian, MD;  Location: [facility] OR[facility];  Service: Orthopedics;  Laterality: Right;     OB History    Gravida  2   Para  1   Term  1   Preterm      AB  1   Living  1     SAB   1   TAB      Ectopic      Multiple      Live Births               Home Medications    Prior to Admission medications   Medication Sig Start Date End Date Taking? Authorizing Provider  aspirin EC 81 MG tablet Take 81 mg by mouth daily.    [provider]  cholecalciferol (VITAMIN D3) 25 MCG (1000 UT) tablet Take 1,000 Units by mouth daily.    [provider]  escitalopram (LEXAPRO) 20 MG tablet TAKE (1) TABLET BY MOUTH ONCE DAILY. Patient taking differently: Take 20 mg by mouth daily.  03/17/19   Susy Frizzle, MD  HYDROcodone-acetaminophen (NORCO/VICODIN) 5-325 MG tablet Take 1 tablet by mouth every 4 (four) hours as needed. 05/02/19   Isla Pence, MD  ibuprofen (ADVIL) 600 MG tablet Take 1 tablet (600 mg total) by mouth every 6 (six) hours as needed. 05/02/19   Isla Pence, MD  levETIRAcetam (KEPPRA) 1000 MG tablet TAKE 1/2 TABLET IN THE MORNING AND 1&1/2 TABLETS IN THE EVENING. Patient taking differently: Take 500-1,500 mg by mouth See admin instructions. TAKE 1/2 TABLET IN THE MORNING AND 1&1/2 TABLETS IN THE EVENING. 02/07/19   Kathrynn Ducking, MD  levothyroxine (SYNTHROID, LEVOTHROID) 50 MCG tablet TAKE ONE TABLET BY MOUTH ONCE DAILY. Patient taking differently: Take 50 mcg by mouth daily before breakfast.  01/07/19   Pickard, Cammie Mcgee, MD  LINZESS 72 MCG capsule TAKE ONE CAPSULE ORALLY EVERY MORNING BEFORE BREAKFAST. Patient taking differently: Take 72 mcg by mouth daily before breakfast.  05/26/18   Annitta Needs, NP  LORazepam (ATIVAN) 1 MG tablet TAKE (1) TABLET BY MOUTH TWICE DAILY AS NEEDED FOR ANXIETY. 04/07/19   Susy Frizzle, MD  naproxen (NAPROSYN) 375 MG tablet Take 1 tablet (375 mg total) by mouth 2 (two) times daily. 03/17/19   Wieters, Hallie C, PA-C  OLANZapine (ZYPREXA) 5 MG tablet TAKE (1) TABLET BY MOUTH AT BEDTIME. 04/19/19   Susy Frizzle, MD  omeprazole (PRILOSEC) 40 MG capsule TAKE (1) CAPSULE BY MOUTH TWICE DAILY. Patient  taking differently: Take 40 mg by mouth 2 (two) times a day.  03/17/19   Susy Frizzle, MD  Oxcarbazepine (TRILEPTAL) 300 MG tablet TAKE (1) TABLET BY MOUTH TWICE DAILY. 04/19/19   Susy Frizzle, MD  prochlorperazine (COMPAZINE) 10 MG tablet TAKE 1 TABLET THREE TIMES DAILY AS NEEDED FOR NAUSEA. Patient not taking: Reported on 03/17/2019 05/03/18   Susy Frizzle, MD  sucralfate (  CARAFATE) 1 g tablet Take 1 tablet (1 g total) by mouth 4 (four) times daily -  with meals and at bedtime. Patient taking differently: Take 1 g by mouth every evening.  12/14/18   Susy Frizzle, MD  traMADol (ULTRAM) 50 MG tablet Take 1 tablet (50 mg total) by mouth every 6 (six) hours as needed. 03/17/19   Lily Kocher, PA-C  traZODone (DESYREL) 100 MG tablet TAKE (1) TABLET BY MOUTH AT BEDTIME. Patient taking differently: Take 100 mg by mouth at bedtime.  03/10/19   Susy Frizzle, MD  valACYclovir (VALTREX) 500 MG tablet TAKE ONE TABLET BY MOUTH ONCE DAILY. Patient taking differently: Take 500 mg by mouth daily as needed (for infection).  02/08/18   Susy Frizzle, MD  vitamin B-12 (CYANOCOBALAMIN) 1000 MCG tablet Take 1,000 mcg by mouth daily.    [provider]    Family History Family History  Problem Relation Age of Onset  . Diabetes Mother   . Hypertension Mother   . Colon cancer Father 20       living   . Hypertension Father   . Hypertension Sister   . Heart attack Brother   . Seizures Maternal Aunt     Social History Social History   Tobacco Use  . Smoking status: Current Every Day Smoker    Packs/day: 0.50    Years: 30.00    Pack years: 15.00    Types: Cigarettes  . Smokeless tobacco: Never Used  . Tobacco comment: trying to quit  Substance Use Topics  . Alcohol use: No    Alcohol/week: 0.0 standard drinks  . Drug use: No    Comment: hx cocaine abuse     Allergies   Patient has no known allergies.   Review of Systems Review of Systems  Respiratory:  Positive for cough.   Musculoskeletal: Positive for myalgias.  Neurological: Positive for weakness.  All other systems reviewed and are negative.    Physical Exam Updated Vital Signs BP (!) 124/108 (BP Location: Right Arm)   Pulse 70   Temp 98.1 F (36.7 C) (Oral)   SpO2 99%   Physical Exam Vitals signs and nursing note reviewed.  Constitutional:      Appearance: Normal appearance.  HENT:     Head: Normocephalic and atraumatic.     Right Ear: External ear normal.     Left Ear: External ear normal.     Nose: Nose normal.     Mouth/Throat:     Mouth: Mucous membranes are moist.     Pharynx: Oropharynx is clear.  Eyes:     Extraocular Movements: Extraocular movements intact.     Conjunctiva/sclera: Conjunctivae normal.     Pupils: Pupils are equal, round, and reactive to light.  Neck:     Musculoskeletal: Normal range of motion and neck supple.  Cardiovascular:     Rate and Rhythm: Normal rate and regular rhythm.     Pulses: Normal pulses.     Heart sounds: Normal heart sounds.  Pulmonary:     Effort: Pulmonary effort is normal.     Breath sounds: Normal breath sounds.  Abdominal:     General: Abdomen is flat. Bowel sounds are normal.     Palpations: Abdomen is soft.  Musculoskeletal: Normal range of motion.  Skin:    General: Skin is warm.     Capillary Refill: Capillary refill takes less than 2 seconds.  Neurological:     General: No focal deficit present.  Mental Status: She is alert and oriented to person, place, and time.  Psychiatric:        Mood and Affect: Mood normal.        Behavior: Behavior normal.        Thought Content: Thought content normal.        Judgment: Judgment normal.      ED Treatments / Results  Labs (all labs ordered are listed, but only abnormal results are displayed) Labs Reviewed  COMPREHENSIVE METABOLIC PANEL - Abnormal; Notable for the following components:      Result Value   Potassium 3.2 (*)    Glucose, Bld 106 (*)     BUN <5 (*)    Calcium 8.6 (*)    All other components within normal limits  CBC WITH DIFFERENTIAL/PLATELET - Abnormal; Notable for the following components:   MCH 24.9 (*)    MCHC 29.6 (*)    All other components within normal limits  URINALYSIS, ROUTINE W REFLEX MICROSCOPIC - Abnormal; Notable for the following components:   APPearance HAZY (*)    All other components within normal limits  SARS CORONAVIRUS 2 (HOSPITAL ORDER, [place] [facility])  TSH    EKG EKG Interpretation  Date/Time:  Monday May 02 2019 20:57:59 EDT Ventricular Rate:  72 PR Interval:    QRS Duration: 78 QT Interval:  409 QTC Calculation: 448 R Axis:   59 Text Interpretation:  Sinus rhythm Probable anteroseptal infarct, old No significant change since last tracing Confirmed by Isla Pence [phone]) on 05/02/2019 9:18:47 PM   Radiology Dg Chest Portable 1 View  Result Date: 05/02/2019 CLINICAL DATA:  Cough EXAM: PORTABLE CHEST 1 VIEW COMPARISON:  12/14/2018 FINDINGS: The heart size and mediastinal contours are within normal limits. Both lungs are clear. The visualized skeletal structures are unremarkable. IMPRESSION: No active disease. Electronically Signed   By: Constance Holster M.D.   On: 05/02/2019 21:39    Procedures Procedures (including critical care time)  Medications Ordered in ED Medications  sodium chloride 0.9 % bolus 1,000 mL (1,000 mLs Intravenous New Bag/Given 05/02/19 2109)  ketorolac (TORADOL) 30 MG/ML injection 30 mg (30 mg Intravenous Given 05/02/19 2109)  ondansetron (ZOFRAN) injection 4 mg (4 mg Intravenous Given 05/02/19 2109)     Initial Impression / Assessment and Plan / ED Course  I have reviewed the triage vital signs and the nursing notes.  Pertinent labs & imaging results that were available during my care of the patient were reviewed by me and considered in my medical decision making (see chart for details).        Pt is feeling much better.  She is  instructed to return if worse.  F/u with pcp.  Final Clinical Impressions(s) / ED Diagnoses   Final diagnoses:  Viral syndrome  Covid-19 Virus not Detected  Myalgia  Dehydration    ED Discharge Orders         Ordered    ibuprofen (ADVIL) 600 MG tablet  Every 6 hours PRN     05/02/19 2303    HYDROcodone-acetaminophen (NORCO/VICODIN) 5-325 MG tablet  Every 4 hours PRN     05/02/19 2303           Isla Pence, MD 05/02/19 2304

## 2019-05-02 NOTE — ED Triage Notes (Signed)
Pt C/O generalized body aches with fever for 2 weeks. Pt was seen at urgent care last Monday and was diagnosed with bronchitis. Pt states antibiotic she is taking is not working.

## 2019-05-09 ENCOUNTER — Other Ambulatory Visit: Payer: Self-pay | Admitting: Family Medicine

## 2019-05-09 NOTE — Telephone Encounter (Signed)
Ok to refill??  Last office visit 12/14/2018.  Last refill 04/07/2019.

## 2019-06-07 ENCOUNTER — Other Ambulatory Visit: Payer: Self-pay | Admitting: Gastroenterology

## 2019-06-07 ENCOUNTER — Other Ambulatory Visit: Payer: Self-pay | Admitting: Family Medicine

## 2019-06-08 NOTE — Telephone Encounter (Signed)
Please tell the patient we haven't seen in her in about 3 years. I can send in a limited refill for 1-2 months but will need a f/u OV for further refills from our office.

## 2019-06-08 NOTE — Telephone Encounter (Signed)
Pt notified that she will need a f/u apt to receive additional refills.

## 2019-06-08 NOTE — Telephone Encounter (Signed)
Ok to refill??  Last office visit 12/14/2018.  Last refill 05/09/2019.

## 2019-07-11 ENCOUNTER — Other Ambulatory Visit: Payer: Self-pay | Admitting: Family Medicine

## 2019-07-11 NOTE — Telephone Encounter (Signed)
Ok to refill??  Last office visit 12/14/2018.  Last refill 06/09/2019

## 2019-07-16 ENCOUNTER — Other Ambulatory Visit: Payer: Self-pay | Admitting: Family Medicine

## 2019-07-21 ENCOUNTER — Other Ambulatory Visit: Payer: Self-pay

## 2019-07-21 ENCOUNTER — Emergency Department (HOSPITAL_COMMUNITY)
Admission: EM | Admit: 2019-07-21 | Discharge: 2019-07-22 | Payer: Medicaid Other | Attending: Emergency Medicine | Admitting: Emergency Medicine

## 2019-07-21 ENCOUNTER — Encounter (HOSPITAL_COMMUNITY): Payer: Self-pay

## 2019-07-21 DIAGNOSIS — F329 Major depressive disorder, single episode, unspecified: Secondary | ICD-10-CM | POA: Diagnosis present

## 2019-07-21 DIAGNOSIS — Z7982 Long term (current) use of aspirin: Secondary | ICD-10-CM | POA: Diagnosis not present

## 2019-07-21 DIAGNOSIS — T1491XA Suicide attempt, initial encounter: Secondary | ICD-10-CM

## 2019-07-21 DIAGNOSIS — F332 Major depressive disorder, recurrent severe without psychotic features: Secondary | ICD-10-CM | POA: Diagnosis not present

## 2019-07-21 DIAGNOSIS — Z8673 Personal history of transient ischemic attack (TIA), and cerebral infarction without residual deficits: Secondary | ICD-10-CM | POA: Diagnosis not present

## 2019-07-21 DIAGNOSIS — Z79899 Other long term (current) drug therapy: Secondary | ICD-10-CM | POA: Insufficient documentation

## 2019-07-21 DIAGNOSIS — R45851 Suicidal ideations: Secondary | ICD-10-CM | POA: Insufficient documentation

## 2019-07-21 DIAGNOSIS — Z20828 Contact with and (suspected) exposure to other viral communicable diseases: Secondary | ICD-10-CM | POA: Diagnosis not present

## 2019-07-21 DIAGNOSIS — F17210 Nicotine dependence, cigarettes, uncomplicated: Secondary | ICD-10-CM | POA: Insufficient documentation

## 2019-07-21 LAB — RAPID URINE DRUG SCREEN, HOSP PERFORMED
Amphetamines: NOT DETECTED
Barbiturates: NOT DETECTED
Benzodiazepines: POSITIVE — AB
Cocaine: POSITIVE — AB
Opiates: NOT DETECTED
Tetrahydrocannabinol: NOT DETECTED

## 2019-07-21 LAB — CBC WITH DIFFERENTIAL/PLATELET
Abs Immature Granulocytes: 0.01 10*3/uL (ref 0.00–0.07)
Basophils Absolute: 0 10*3/uL (ref 0.0–0.1)
Basophils Relative: 0 %
Eosinophils Absolute: 0.2 10*3/uL (ref 0.0–0.5)
Eosinophils Relative: 4 %
HCT: 40.5 % (ref 36.0–46.0)
Hemoglobin: 12.2 g/dL (ref 12.0–15.0)
Immature Granulocytes: 0 %
Lymphocytes Relative: 32 %
Lymphs Abs: 1.5 10*3/uL (ref 0.7–4.0)
MCH: 25.4 pg — ABNORMAL LOW (ref 26.0–34.0)
MCHC: 30.1 g/dL (ref 30.0–36.0)
MCV: 84.4 fL (ref 80.0–100.0)
Monocytes Absolute: 0.4 10*3/uL (ref 0.1–1.0)
Monocytes Relative: 8 %
Neutro Abs: 2.7 10*3/uL (ref 1.7–7.7)
Neutrophils Relative %: 56 %
Platelets: 235 10*3/uL (ref 150–400)
RBC: 4.8 MIL/uL (ref 3.87–5.11)
RDW: 16.7 % — ABNORMAL HIGH (ref 11.5–15.5)
WBC: 4.8 10*3/uL (ref 4.0–10.5)
nRBC: 0 % (ref 0.0–0.2)

## 2019-07-21 LAB — COMPREHENSIVE METABOLIC PANEL
ALT: 9 U/L (ref 0–44)
AST: 14 U/L — ABNORMAL LOW (ref 15–41)
Albumin: 3.1 g/dL — ABNORMAL LOW (ref 3.5–5.0)
Alkaline Phosphatase: 128 U/L — ABNORMAL HIGH (ref 38–126)
Anion gap: 7 (ref 5–15)
BUN: 6 mg/dL (ref 6–20)
CO2: 26 mmol/L (ref 22–32)
Calcium: 8.4 mg/dL — ABNORMAL LOW (ref 8.9–10.3)
Chloride: 110 mmol/L (ref 98–111)
Creatinine, Ser: 1.09 mg/dL — ABNORMAL HIGH (ref 0.44–1.00)
GFR calc Af Amer: >60 mL/min (ref >60)
GFR calc non Af Amer: 56 mL/min — ABNORMAL LOW (ref >60)
Glucose, Bld: 95 mg/dL (ref 70–99)
Potassium: 4 mmol/L (ref 3.5–5.1)
Sodium: 143 mmol/L (ref 135–145)
Total Bilirubin: 0.4 mg/dL (ref 0.3–1.2)
Total Protein: 5.8 g/dL — ABNORMAL LOW (ref 6.5–8.1)

## 2019-07-21 LAB — PREGNANCY, URINE: Preg Test, Ur: NEGATIVE

## 2019-07-21 LAB — ETHANOL: Alcohol, Ethyl (B): <10 mg/dL (ref <10)

## 2019-07-21 LAB — ACETAMINOPHEN LEVEL: Acetaminophen (Tylenol), Serum: <10 ug/mL — ABNORMAL LOW (ref 10–30)

## 2019-07-21 LAB — SALICYLATE LEVEL: Salicylate Lvl: <7 mg/dL (ref 2.8–30.0)

## 2019-07-21 LAB — LITHIUM LEVEL: Lithium Lvl: <0.06 mmol/L — ABNORMAL LOW (ref 0.60–1.20)

## 2019-07-21 LAB — CBG MONITORING, ED: Glucose-Capillary: 96 mg/dL (ref 70–99)

## 2019-07-21 MED ORDER — ACETAMINOPHEN 500 MG PO TABS
1000.0000 mg | ORAL_TABLET | Freq: Once | ORAL | Status: AC
  Administered 2019-07-21: 21:00:00 1000 mg via ORAL
  Filled 2019-07-21: qty 2

## 2019-07-21 MED ORDER — LORAZEPAM 2 MG/ML IJ SOLN: 0.0000 mg | Freq: Two times a day (BID) | INTRAMUSCULAR | Status: DC

## 2019-07-21 MED ORDER — VITAMIN B-1 100 MG PO TABS
100.0000 mg | ORAL_TABLET | Freq: Every day | ORAL | Status: DC
  Administered 2019-07-21 – 2019-07-22 (×2): 100 mg via ORAL
  Filled 2019-07-21 (×2): qty 1

## 2019-07-21 MED ORDER — THIAMINE HCL 100 MG/ML IJ SOLN
100.0000 mg | Freq: Every day | INTRAMUSCULAR | Status: DC
  Filled 2019-07-21: qty 2

## 2019-07-21 MED ORDER — LORAZEPAM 1 MG PO TABS: 0.0000 mg | ORAL_TABLET | Freq: Two times a day (BID) | ORAL | Status: DC

## 2019-07-21 MED ORDER — LORAZEPAM 2 MG/ML IJ SOLN: 0.0000 mg | Freq: Four times a day (QID) | INTRAMUSCULAR | Status: DC

## 2019-07-21 MED ORDER — LORAZEPAM 1 MG PO TABS
0.0000 mg | ORAL_TABLET | Freq: Four times a day (QID) | ORAL | Status: DC
  Administered 2019-07-22: 13:00:00 1 mg via ORAL
  Filled 2019-07-21: qty 1

## 2019-07-21 NOTE — ED Notes (Signed)
Pt sleeping at this time.

## 2019-07-21 NOTE — ED Notes (Signed)
Ardeth Sportsman sister [phone] (call first)  Gillian Shields mother [phone]  Alfonso Ellis daughter [phone]  Luellen Pucker [phone]  Champ Mungo [phone]

## 2019-07-21 NOTE — ED Triage Notes (Signed)
Pt took handful of ativan, unknown how many and unknown doseage strength. Told her friend she took them. + SI and states she is tired of being depressed.

## 2019-07-21 NOTE — ED Triage Notes (Signed)
Pt reports she was tired of being depressed and took a handful of ativan. Pt lethargic upon EMS arrival . Pt able to walk to friends apartment and told her what she did and then collapsed on sofa

## 2019-07-21 NOTE — ED Notes (Signed)
Pt denies daily ETOH use

## 2019-07-21 NOTE — Progress Notes (Signed)
Pt meets inpatient criteria per Jinny Blossom, NP. Referral information has been sent to the following hospitals for review:  [facility]  [facility]      Disposition will continue to assist with inpatient placement needs.   Audree Camel, LCSW, [place] Disposition [facility] [phone]

## 2019-07-21 NOTE — ED Provider Notes (Signed)
[facility] EMERGENCY DEPARTMENT Provider Note   CSN: [id number] Arrival date & time: 07/21/19  1647     History   Chief Complaint Chief Complaint  Patient presents with  . V70.1    HPI Sheryl Hall is a 58 y.o. female.     HPI  This patient is a 58 year old female, she has a known history of bipolar disorder, she has chronic anxiety, panic attacks, polysubstance abuse, posttraumatic stress disorder and presents after having an intentional overdose of Ativan.  The patient reports that she has been increasingly anxious and upset, increasingly depressed, she will not go into the details but states that she got to the point today where she took an overdose of Ativan, states this medication is prescribed to her.  She says she took a handful, she does not know how many pills it was but it was around 11:30 in the morning.  She then went over to a friend's house who noticed that she was abnormal, asked her what happened and then called 911 when she found out it was an overdose.  The patient states it was a suicide attempt.  She is somewhat tearful during this time.  The patient was found somnolent by EMS but ambulatory with some assistance.  The patient denies any other overdoses this morning however she does state that she has tried overdose and kill herself in the past.  Past Medical History:  Diagnosis Date  . Anxiety   . Arthritis   . Avascular necrosis of hip ([place])    left  . Bipolar 1 disorder ([place])   . Chronic left hip pain   . Chronic nausea   . Depression   . Diverticulosis of colon   . Family hx of colon cancer    age 64-father  . Folate deficiency   . GERD (gastroesophageal reflux disease)   . Headache   . Heart murmur   . Hx of abnormal Pap smear   . Hyperlipidemia   . Hyperthyroidism   . Keratosis, actinic   . Low back pain 03/01/2013   MRI with multiple levels of disc bulge  . Panic attacks   . Polysubstance abuse ([place])   . PTSD (post-traumatic stress  disorder)   . S/P colonoscopy 05/30/2010   LAX sphincter tone, anal papilla, left-sided diverticulosis, normal random biopsies,, 1 polyp-TA  . S/P endoscopy 05/30/2010   Dr Rourk-> non--critical Schatzki's ring, s/p 20F dilation  . Seizures ([place])   . Stroke [facility]) 2014   Right parietal, no deficits   . Tubular adenoma of colon 05/30/2010   Next colonoscopy 05/2015  . UTI (urinary tract infection)     Patient Active Problem List   Diagnosis Date Noted  . 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  . Avascular necrosis of femur head, right ([place]) 05/23/2015  . Constipation 09/20/2014  . Loose stools 08/02/2014  . Postoperative anemia due to acute blood loss 04/20/2014  . Avascular necrosis of femur head, left ([place]) 04/19/2014  . OA (osteoarthritis) of hip 04/19/2014  . Partial epilepsy with impairment of consciousness ([place]) 04/13/2014  . Pain in limb 12/19/2013  . History of stroke 12/19/2013  . Acute confusional state 03/24/2013  . Low back pain   . PTSD (post-traumatic stress disorder) 03/04/2013  . Bipolar disorder ([place]) 03/04/2013  . Anxiety   . Depression   . Stroke ([place])   . Hyperthyroidism   . Hyperlipidemia   . Folate deficiency   . Hx of abnormal Pap smear   .  Abdominal bloating 11/05/2011  . IBS (irritable bowel syndrome) 11/05/2011  . Chronic diarrhea 09/18/2011  . Gastroparesis 09/18/2011  . Drug abuse ([place]) 09/18/2011  . Tubular adenoma of colon 09/18/2011  . Family hx of colon cancer 09/18/2011  . COLONIC POLYPS, ADENOMATOUS 07/08/2010  . DIZZINESS 07/08/2010  . GERD 05/16/2010  . WEIGHT LOSS 05/16/2010  . NAUSEA WITH VOMITING 05/16/2010  . Dysphagia 05/16/2010  . DIARRHEA 05/16/2010  . ABDOMINAL PAIN, UNSPECIFIED SITE 05/16/2010    Past Surgical History:  Procedure Laterality Date  . COLONOSCOPY     every 5 years  . COLONOSCOPY  05/30/2010   RMR:lax anal sphincter tone,anal papilla,otherwise normal/left-sided diverticula  . ESOPHAGOGASTRODUODENOSCOPY  05/30/2010    [id number] ring/small HH otherwise normal  . EXTERNAL EAR SURGERY Left 12 years ago   skin graft from behind ear put in ear canal  . JOINT REPLACEMENT    . NECK SURGERY  10 years ago  . SKIN CANCER DESTRUCTION    . 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 TOTAL HIP ARTHROPLASTY Right 05/23/2015   Procedure: RIGHT TOTAL HIP ARTHROPLASTY ANTERIOR APPROACH;  Surgeon: Gaynelle Arabian, MD;  Location: [facility];  Service: Orthopedics;  Laterality: Right;     OB History    Gravida  2   Para  1   Term  1   Preterm      AB  1   Living  1     SAB  1   TAB      Ectopic      Multiple      Live Births               Home Medications    Prior to Admission medications   Medication Sig Start Date End Date Taking? Authorizing Provider  aspirin EC 81 MG tablet Take 81 mg by mouth daily.    [provider]  cholecalciferol (VITAMIN D3) 25 MCG (1000 UT) tablet Take 1,000 Units by mouth daily.    [provider]  escitalopram (LEXAPRO) 20 MG tablet TAKE (1) TABLET BY MOUTH ONCE DAILY. Patient taking differently: Take 20 mg by mouth daily.  03/17/19   Susy Frizzle, MD  HYDROcodone-acetaminophen (NORCO/VICODIN) 5-325 MG tablet Take 1 tablet by mouth every 4 (four) hours as needed. 05/02/19   Isla Pence, MD  ibuprofen (ADVIL) 600 MG tablet Take 1 tablet (600 mg total) by mouth every 6 (six) hours as needed. 05/02/19   Isla Pence, MD  levETIRAcetam (KEPPRA) 1000 MG tablet TAKE 1/2 TABLET IN THE MORNING AND 1&1/2 TABLETS IN THE EVENING. Patient taking differently: Take 500-1,500 mg by mouth See admin instructions. TAKE 1/2 TABLET IN THE MORNING AND 1&1/2 TABLETS IN THE EVENING. 02/07/19   Kathrynn Ducking, MD  levothyroxine (SYNTHROID) 50 MCG tablet TAKE ONE TABLET BY MOUTH ONCE DAILY. 07/11/19   Susy Frizzle, MD  LINZESS 72 MCG capsule TAKE ONE CAPSULE ORALLY EVERY MORNING BEFORE BREAKFAST. 06/08/19   Carlis Stable, NP  LORazepam (ATIVAN) 1 MG tablet TAKE (1) TABLET BY MOUTH TWICE DAILY AS NEEDED FOR ANXIETY. 07/11/19   Susy Frizzle, MD  naproxen (NAPROSYN) 375 MG tablet Take 1 tablet (375 mg total) by mouth 2 (two) times daily. 03/17/19   Wieters, Hallie C, PA-C  OLANZapine (ZYPREXA) 5 MG tablet TAKE (1) TABLET BY MOUTH AT  BEDTIME. 07/18/19   Susy Frizzle, MD  omeprazole (PRILOSEC) 40 MG capsule TAKE (1) CAPSULE BY MOUTH TWICE DAILY. Patient taking differently: Take 40 mg by mouth 2 (two) times a day.  03/17/19   Susy Frizzle, MD  Oxcarbazepine (TRILEPTAL) 300 MG tablet TAKE (1) TABLET BY MOUTH TWICE DAILY. 07/18/19   Susy Frizzle, MD  prochlorperazine (COMPAZINE) 10 MG tablet TAKE 1 TABLET THREE TIMES DAILY AS NEEDED FOR NAUSEA. Patient not taking: Reported on 03/17/2019 05/03/18   Susy Frizzle, MD  sucralfate (CARAFATE) 1 g tablet Take 1 tablet (1 g total) by mouth 4 (four) times daily -  with meals and at bedtime. Patient taking differently: Take 1 g by mouth every evening.  12/14/18   Susy Frizzle, MD  traMADol (ULTRAM) 50 MG tablet Take 1 tablet (50 mg total) by mouth every 6 (six) hours as needed. 03/17/19   Lily Kocher, PA-C  traZODone (DESYREL) 100 MG tablet TAKE (1) TABLET BY MOUTH AT BEDTIME. 06/09/19   Susy Frizzle, MD  valACYclovir (VALTREX) 500 MG tablet TAKE ONE TABLET BY MOUTH ONCE DAILY. Patient taking differently: Take 500 mg by mouth daily as needed (for infection).  02/08/18   Susy Frizzle, MD  vitamin B-12 (CYANOCOBALAMIN) 1000 MCG tablet Take 1,000 mcg by mouth daily.    [provider]    Family History Family History  Problem Relation Age of Onset  . Diabetes Mother   . Hypertension Mother   . Colon cancer Father 17       living   . Hypertension Father   . Hypertension Sister   . Heart attack Brother   . Seizures Maternal Aunt      Social History Social History   Tobacco Use  . Smoking status: Current Every Day Smoker    Packs/day: 0.50    Years: 30.00    Pack years: 15.00    Types: Cigarettes  . Smokeless tobacco: Never Used  . Tobacco comment: trying to quit  Substance Use Topics  . Alcohol use: No    Alcohol/week: 0.0 standard drinks  . Drug use: No    Comment: hx cocaine abuse     Allergies   Patient has no known allergies.   Review of Systems Review of Systems  All other systems reviewed and are negative.    Physical Exam Updated Vital Signs BP 122/67   Pulse 72   Resp 19   Ht 1.676 m (5\' 6" )   Wt 81.6 kg   SpO2 95%   BMI 29.05 kg/m   Physical Exam Vitals signs and nursing note reviewed.  Constitutional:      General: She is not in acute distress.    Appearance: She is well-developed.  HENT:     Head: Normocephalic and atraumatic.     Mouth/Throat:     Pharynx: No oropharyngeal exudate.  Eyes:     General: No scleral icterus.       Right eye: No discharge.        Left eye: No discharge.     Conjunctiva/sclera: Conjunctivae normal.     Pupils: Pupils are equal, round, and reactive to light.  Neck:     Musculoskeletal: Normal range of motion and neck supple.     Thyroid: No thyromegaly.     Vascular: No JVD.  Cardiovascular:     Rate and Rhythm: Normal rate and regular rhythm.     Heart sounds: Normal heart sounds. No murmur. No  friction rub. No gallop.   Pulmonary:     Effort: Pulmonary effort is normal. No respiratory distress.     Breath sounds: Normal breath sounds. No wheezing or rales.  Abdominal:     General: Bowel sounds are normal. There is no distension.     Palpations: Abdomen is soft. There is no mass.     Tenderness: There is no abdominal tenderness.  Musculoskeletal: Normal range of motion.        General: No tenderness.  Lymphadenopathy:     Cervical: No cervical adenopathy.  Skin:    General: Skin is warm and dry.     Findings: No erythema or  rash.  Neurological:     Mental Status: She is alert.     Coordination: Coordination normal.     Comments: The patient has a slight slurred speech, she is moving very slowly however she is able to move to the stretcher by herself.  She is able to follow commands but appears generally diffusely weak.  There is no facial droop, no asymmetry  Psychiatric:     Comments: The patient is depressed, sad, tearful, endorses suicidal      ED Treatments / Results  Labs (all labs ordered are listed, but only abnormal results are displayed) Labs Reviewed  LITHIUM LEVEL  COMPREHENSIVE METABOLIC PANEL  ETHANOL  RAPID URINE DRUG SCREEN, HOSP PERFORMED  CBC WITH DIFFERENTIAL/PLATELET  SALICYLATE LEVEL  ACETAMINOPHEN LEVEL  POC URINE PREG, ED  POC URINE PREG, ED  CBG MONITORING, ED    EKG None  Radiology No results found.  Procedures Procedures (including critical care time)  Medications Ordered in ED Medications  LORazepam (ATIVAN) injection 0-4 mg (has no administration in time range)    Or  LORazepam (ATIVAN) tablet 0-4 mg (has no administration in time range)  LORazepam (ATIVAN) injection 0-4 mg (has no administration in time range)    Or  LORazepam (ATIVAN) tablet 0-4 mg (has no administration in time range)  thiamine (VITAMIN B-1) tablet 100 mg (has no administration in time range)    Or  thiamine (B-1) injection 100 mg (has no administration in time range)     Initial Impression / Assessment and Plan / ED Course  I have reviewed the triage vital signs and the nursing notes.  Pertinent labs & imaging results that were available during my care of the patient were reviewed by me and considered in my medical decision making (see chart for details).        The patient is taken an intentional overdose of her Ativan, she does comment that she may be on lithium.  We will check for coingestants.  She will need to have labs drawn, cardiac monitoring, stabilizing care.  She will  likely need to be seen by psych secondary to her intentional overdose.  At this time she is maintaining her airway, it is been approximately 6 hours or 6 5 hours since the overdose, will keep on cardiac and pulse ox monitoring.  Patient agreeable  Final Clinical Impressions(s) / ED Diagnoses   Final diagnoses:  None    ED Discharge Orders    None       Noemi Chapel, MD 07/22/19 2352

## 2019-07-21 NOTE — BH Assessment (Signed)
Tele Assessment Note   Patient Name: Sheryl Hall MRN: [id number] Referring Physician: Sabra Heck Location of Patient: [facility] Location of Provider: [place]  RUTHMARY HOLDERBY is an 58 y.o. female who presents voluntarily accompanied by her sister, Ardeth Sportsman [phone]) reporting that she overdosed today in a suicide attempt. Her sister says, "I just want her to get help--her meds are off, she sleeps all the time". Pt acknowledges symptoms including racing thoughts, social withdrawal, loss of interest in usual pleasures, decreased concentration, fatigue, irritability, increased sleep, decreased appetite and feelings of hopelessness.  Pt endorses SI, denies HI, psychosis, SA. PT describes 2 past attempts, history of violence. Pt identifies primary stressors as "all the bull going on". Pt identifies primary residence as alone. Pt identifies primary supports as family, sister. Pt identifies current/previous treatment as OP with [facility] family practice. Pt reports medication compliant.  Pt has poor insight and judgment. Pt's memory is typical.? ? MSE: Pt is casually dressed, disheveled, sedated oriented x4 with normal speech and slow motor behavior. Eye contact is good. Pt's mood is depressed and affect is depressed. Affect is congruent with mood. Thought process is coherent and relevant. There is no indication that pt is currently responding to internal stimuli or experiencing delusional thought content. Pt was cooperative throughout assessment.   Jinny Blossom, NP recommended inpatient psychiatric treatment. TTS to seek placement when pt is medically cleared. Notified EDP/staff.   Diagnosis: Primary Mental Health  F33.2 MDD recurrent severe, without psychosis    Past Medical History:  Past Medical History:  Diagnosis Date  . Anxiety   . Arthritis   . 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 Bipolar 1 disorder ([place])   . Chronic left hip pain   .  Chronic nausea   . Depression   . Diverticulosis of colon   . Family hx of colon cancer    age 38-father  . Folate deficiency   . GERD (gastroesophageal reflux disease)   . Headache   . Heart murmur   . Hx of abnormal Pap smear   . Hyperlipidemia   . Hyperthyroidism   . Keratosis, actinic   . Low back pain 03/01/2013   MRI with multiple levels of disc bulge  . Panic attacks   . Polysubstance abuse ([place])   . PTSD (post-traumatic stress disorder)   . S/P colonoscopy 05/30/2010   LAX sphincter tone, anal papilla, left-sided diverticulosis, normal random biopsies,, 1 polyp-TA  . S/P endoscopy 05/30/2010   Dr Rourk-> non--critical Schatzki's ring, s/p 90F dilation  . Seizures ([place])   . Stroke [facility]) 2014   Right parietal, no deficits   . Tubular adenoma of colon 05/30/2010   Next colonoscopy 05/2015  . UTI (urinary tract infection)     Past Surgical History:  Procedure Laterality Date  . COLONOSCOPY     every 5 years  . COLONOSCOPY  05/30/2010   RMR:lax anal sphincter tone,anal papilla,otherwise normal/left-sided diverticula  . ESOPHAGOGASTRODUODENOSCOPY  05/30/2010   [id number] ring/small HH otherwise normal  . EXTERNAL EAR SURGERY Left 12 years ago   skin graft from behind ear put in ear canal  . JOINT REPLACEMENT    . NECK SURGERY  10 years ago  . SKIN CANCER DESTRUCTION    . 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 TOTAL HIP  ARTHROPLASTY Right 05/23/2015   Procedure: RIGHT TOTAL HIP ARTHROPLASTY ANTERIOR APPROACH;  Surgeon: Gaynelle Arabian, MD;  Location[facility];  Service: Orthopedics;  Laterality: Right;    Family History:  Family History  Problem Relation Age of Onset  . Diabetes Mother   . Hypertension Mother   . Colon cancer Father 72       living   . Hypertension Father   . Hypertension Sister   . Heart attack  Brother   . Seizures Maternal Aunt     Social History:  reports that she has been smoking cigarettes. She has a 15.00 pack-year smoking history. She has never used smokeless tobacco. She reports that she does not drink alcohol or use drugs.  Additional Social History:  Alcohol / Drug Use Pain Medications: denies Prescriptions: denies Over the Counter: denies History of alcohol / drug use?: No history of alcohol / drug abuse Longest period of sobriety (when/how long): denies  CIWA: CIWA-Ar BP: 124/83 Pulse Rate: 72 COWS:    Allergies: No Known Allergies  Home Medications: (Not in a hospital admission)   OB/GYN Status:  No LMP recorded. Patient is postmenopausal.  General Assessment Data Location of Assessment: [facility] TTS Assessment: In system Is this a Tele or Face-to-Face Assessment?: Tele Assessment Is this an Initial Assessment or a Re-assessment for this encounter?: Initial Assessment Patient Accompanied by:: (sister) Language Other than English: No Living Arrangements: (home) What gender do you identify as?: Female Marital status: Divorced Pharmacist, community name: [place] Pregnancy Status: No Living Arrangements: Alone Can pt return to current living arrangement?: Yes Admission Status: Voluntary Is patient capable of signing voluntary admission?: Yes Referral Source: Self/Family/Friend Insurance type: MCD  Medical Screening Exam ([place]) Medical Exam completed: No  Crisis Care Plan Living Arrangements: Alone Legal Guardian: (NA) Name of Psychiatrist: [facility]     Risk to self with the past 6 months Suicidal Ideation: Yes-Currently Present Has patient been a risk to self within the past 6 months prior to admission? : Yes Suicidal Intent: Yes-Currently Present Has patient had any suicidal intent within the past 6 months prior to admission? : No Is patient at risk for suicide?: Yes Suicidal Plan?: Yes-Currently Present Has patient had any  suicidal plan within the past 6 months prior to admission? : Yes Specify Current Suicidal Plan: OD Access to Means: Yes Specify Access to Suicidal Means: OD What has been your use of drugs/alcohol within the last 12 months?: denies Previous Attempts/Gestures: Yes How many times?: 2 Other Self Harm Risks: (none known) Triggers for Past Attempts: Unpredictable Intentional Self Injurious Behavior: None Family Suicide History: No Recent stressful life event(s): (chronic health issues) Persecutory voices/beliefs?: No Depression: Yes Depression Symptoms: Isolating, Fatigue, Guilt, Loss of interest in usual pleasures, Feeling worthless/self pity Substance abuse history and/or treatment for substance abuse?: No Suicide prevention information given to non-admitted patients: Not applicable  Risk to Others within the past 6 months Homicidal Ideation: No Does patient have any lifetime risk of violence toward others beyond the six months prior to admission? : No Thoughts of Harm to Others: No Current Homicidal Intent: No Current Homicidal Plan: No Access to Homicidal Means: No History of harm to others?: No Assessment of Violence: None Noted Does patient have access to weapons?: No Criminal Charges Pending?: No Does patient have a court date: No Is patient on probation?: No  Psychosis Hallucinations: None noted Delusions: None noted  Mental Status Report Appearance/Hygiene: Disheveled Eye Contact: Good Motor Activity:  Psychomotor retardation Speech: Logical/coherent Level of Consciousness: Drowsy, Sedated Mood: Depressed Affect: Depressed Anxiety Level: Minimal Thought Processes: Coherent, Relevant Judgement: Impaired Orientation: Person, Place, Time, Situation, Appropriate for developmental age Obsessive Compulsive Thoughts/Behaviors: None  Cognitive Functioning Concentration: Normal Memory: Recent Intact, Remote Intact Is patient IDD: No Insight: Poor Impulse Control:  Poor Appetite: Fair Have you had any weight changes? : No Change Sleep: Increased Total Hours of Sleep: 14 Vegetative Symptoms: Staying in bed, Decreased grooming  ADLScreening [facility] Assessment Services) Patient's cognitive ability adequate to safely complete daily activities?: Yes Patient able to express need for assistance with ADLs?: Yes Independently performs ADLs?: Yes (appropriate for developmental age)  Prior Inpatient Therapy Prior Inpatient Therapy: Yes Prior Therapy Dates: unk Prior Therapy Facilty/Provider(s): [facility] Reason for Treatment: SI, depression  Prior Outpatient Therapy Prior Outpatient Therapy: Yes Prior Therapy Dates: ongoing Prior Therapy Facilty/Provider(s): [facility] Reason for Treatment: depresion Does patient have an ACCT team?: No Does patient have Intensive In-House Services?  : No Does patient have [facility] services? : No Does patient have P4CC services?: No  ADL Screening (condition at time of admission) Patient's cognitive ability adequate to safely complete daily activities?: Yes Is the patient deaf or have difficulty hearing?: No Does the patient have difficulty seeing, even when wearing glasses/contacts?: No Does the patient have difficulty concentrating, remembering, or making decisions?: No Patient able to express need for assistance with ADLs?: Yes Does the patient have difficulty dressing or bathing?: No Independently performs ADLs?: Yes (appropriate for developmental age) Does the patient have difficulty walking or climbing stairs?: No Weakness of Legs: None Weakness of Arms/Hands: None  Home Assistive Devices/Equipment Home Assistive Devices/Equipment: None  Therapy Consults (therapy consults require a physician order) PT Evaluation Needed: No OT Evalulation Needed: No SLP Evaluation Needed: No Abuse/Neglect Assessment (Assessment to be complete while patient is alone) Abuse/Neglect Assessment Can Be Completed: Yes Physical  Abuse: Denies Verbal Abuse: Denies Sexual Abuse: Denies Exploitation of patient/patient's resources: Denies Self-Neglect: Denies Values / Beliefs Cultural Requests During Hospitalization: None Spiritual Requests During Hospitalization: None Consults Spiritual Care Consult Needed: No Social Work Consult Needed: No Regulatory affairs officer (For Healthcare) Does Patient Have a Medical Advance Directive?: No Would patient like information on creating a medical advance directive?: No - Patient declined          Disposition:  Disposition Initial Assessment Completed for this Encounter: Yes  This service was provided via telemedicine using a 2-way, interactive audio and video technology.  Names of all persons participating in this telemedicine service and their role in this encounter. Name: Ellouise Newer Role: TTS counselor  Ardeth Sportsman Role: pt's sister          Sheliah Hatch 07/21/2019 6:47 PM

## 2019-07-21 NOTE — ED Provider Notes (Signed)
This patient has been officially cleared for psychiatric evaluation and admission.  It is 8:43 PM.  Her labs are unremarkable and reassuring.  She has a bright mental status at this point and has been ambulatory.   Noemi Chapel, MD 07/21/19 2043

## 2019-07-22 LAB — SARS CORONAVIRUS 2 BY RT PCR (HOSPITAL ORDER, PERFORMED IN ~~LOC~~ HOSPITAL LAB): SARS Coronavirus 2: NEGATIVE

## 2019-07-22 NOTE — ED Notes (Signed)
Called [facility] for transport to [facility].

## 2019-07-22 NOTE — Progress Notes (Signed)
Pt accepted to [facility]    Dr. Dareen Piano is the attending provider.    Call report to [phone]   [email or web address] notified.     Pt is voluntary and can be transported by [employer].    Pt may arrive as soon as transportation is arranged.   Audree Camel, LCSW, [facility] Dis[facility]/TTS [phone]

## 2019-07-22 NOTE — ED Notes (Signed)
Pt says she isn't suicidal anymore, asked pt when did this change, pt says "after I calmed down". Dr Betsey Holiday aware and says if pt tries to leave, he will take out IVC papers. Pt was informed of this.

## 2019-08-10 ENCOUNTER — Other Ambulatory Visit: Payer: Self-pay | Admitting: Family Medicine

## 2019-08-11 NOTE — Telephone Encounter (Signed)
Ok to refill??  Last office visit 12/14/2018  Last refill 07/11/2019.

## 2019-09-15 ENCOUNTER — Other Ambulatory Visit: Payer: Self-pay | Admitting: Family Medicine

## 2019-09-15 NOTE — Telephone Encounter (Signed)
Requesting refill    Ativan  LOV: 12/14/18  LRF:  08/11/19

## 2019-09-20 ENCOUNTER — Ambulatory Visit: Payer: Medicaid Other | Admitting: Adult Health

## 2019-09-26 ENCOUNTER — Other Ambulatory Visit: Payer: Self-pay | Admitting: Family Medicine

## 2019-10-17 ENCOUNTER — Other Ambulatory Visit: Payer: Self-pay | Admitting: Family Medicine

## 2019-10-18 NOTE — Telephone Encounter (Signed)
Last office visit: 12/14/2018 Last refilled: 09/15/2019

## 2019-10-18 NOTE — Telephone Encounter (Signed)
Pt needs OV scheduled

## 2019-10-19 ENCOUNTER — Ambulatory Visit: Payer: Medicaid Other | Admitting: Neurology

## 2019-10-26 ENCOUNTER — Other Ambulatory Visit: Payer: Self-pay | Admitting: Family Medicine

## 2019-11-14 ENCOUNTER — Other Ambulatory Visit: Payer: Self-pay | Admitting: Neurology

## 2019-11-19 ENCOUNTER — Other Ambulatory Visit: Payer: Self-pay | Admitting: Family Medicine

## 2019-11-21 NOTE — Telephone Encounter (Signed)
Last office visit: 12/14/2018 Last refilled: 10/18/2019

## 2019-12-26 ENCOUNTER — Other Ambulatory Visit: Payer: Self-pay | Admitting: Family Medicine

## 2019-12-27 NOTE — Telephone Encounter (Signed)
Ok to refill??  Last office visit 12/14/2018.  Last refill 11/21/2019.  Of note, letter sent to patient to schedule OV.

## 2020-01-12 ENCOUNTER — Ambulatory Visit: Payer: Medicaid Other | Admitting: Neurology

## 2020-01-25 ENCOUNTER — Other Ambulatory Visit: Payer: Self-pay | Admitting: Family Medicine

## 2020-01-25 NOTE — Telephone Encounter (Signed)
Ok to refill??  Last office visit 12/14/2018.  Last refill 12/27/2019.

## 2020-02-14 ENCOUNTER — Other Ambulatory Visit: Payer: Self-pay | Admitting: Adult Health

## 2020-02-14 NOTE — Telephone Encounter (Signed)
Refilled keppra x 3 months with note to pharmacy: she must keep FU in May.

## 2020-02-22 ENCOUNTER — Other Ambulatory Visit: Payer: Self-pay | Admitting: Family Medicine

## 2020-02-23 NOTE — Telephone Encounter (Signed)
Requesting refill    Lorazepam  LOV: 12/14/2018  LRF:  01/26/2020

## 2020-02-29 ENCOUNTER — Ambulatory Visit
Admission: EM | Admit: 2020-02-29 | Discharge: 2020-02-29 | Disposition: A | Discharge status: Home / Self Care | Payer: Medicaid Other | Attending: Emergency Medicine | Admitting: Emergency Medicine

## 2020-02-29 ENCOUNTER — Other Ambulatory Visit: Payer: Self-pay

## 2020-02-29 ENCOUNTER — Encounter: Payer: Self-pay | Admitting: Emergency Medicine

## 2020-02-29 DIAGNOSIS — N898 Other specified noninflammatory disorders of vagina: Secondary | ICD-10-CM | POA: Diagnosis present

## 2020-02-29 DIAGNOSIS — R34 Anuria and oliguria: Secondary | ICD-10-CM | POA: Diagnosis present

## 2020-02-29 DIAGNOSIS — R8290 Unspecified abnormal findings in urine: Secondary | ICD-10-CM | POA: Diagnosis present

## 2020-02-29 LAB — POCT URINALYSIS DIP (MANUAL ENTRY)
Bilirubin, UA: NEGATIVE
Blood, UA: NEGATIVE
Glucose, UA: 100 mg/dL — AB
Ketones, POC UA: NEGATIVE mg/dL
Nitrite, UA: NEGATIVE
Protein Ur, POC: 100 mg/dL — AB
Spec Grav, UA: >=1.03 — AB (ref 1.010–1.025)
Urobilinogen, UA: 0.2 E.U./dL
pH, UA: 6 (ref 5.0–8.0)

## 2020-02-29 MED ORDER — TAMSULOSIN HCL 0.4 MG PO CAPS: 0.4000 mg | ORAL_CAPSULE | Freq: Every day | ORAL | 0 refills | Status: DC

## 2020-02-29 NOTE — ED Provider Notes (Signed)
[facility]   CC: Decreased urine output and strong urine odor  SUBJECTIVE:  Sheryl Hall is a 59 y.o. female who complains of decrease urine output, and strong urine odor x 2 weeks.  Patient denies a precipitating event, recent sexual encounter, excessive caffeine intake.  Last sex 6 years ago.  Denies abdominal or back pain today.   Has tried OTC medications without relief.  Symptoms are made worse with urination.  Denies similar symptoms in the past.  Also admits to possible vaginal odor.  Denies fever, chills, nausea, vomiting, abdominal pain, flank pain, abnormal vaginal discharge or bleeding, hematuria.    LMP: No LMP recorded. Patient is postmenopausal.  ROS: As in HPI.  All other pertinent ROS negative.     Past Medical History:  Diagnosis Date  . Anxiety   . Arthritis   . Avascular necrosis of hip ([place])    left  . Bipolar 1 disorder ([place])   . Chronic left hip pain   . Chronic nausea   . Depression   . Diverticulosis of colon   . Family hx of colon cancer    age 21-father  . Folate deficiency   . GERD (gastroesophageal reflux disease)   . Headache   . Heart murmur   . Hx of abnormal Pap smear   . Hyperlipidemia   . Hyperthyroidism   . Keratosis, actinic   . Low back pain 03/01/2013   MRI with multiple levels of disc bulge  . Panic attacks   . Polysubstance abuse ([place])   . PTSD (post-traumatic stress disorder)   . S/P colonoscopy 05/30/2010   LAX sphincter tone, anal papilla, left-sided diverticulosis, normal random biopsies,, 1 polyp-TA  . S/P endoscopy 05/30/2010   Dr Rourk-> non--critical Schatzki's ring, s/p 25F dilation  . Seizures ([place])   . Stroke [facility]) 2014   Right parietal, no deficits   . Tubular adenoma of colon 05/30/2010   Next colonoscopy 05/2015  . UTI (urinary tract infection)    Past Surgical History:  Procedure Laterality Date  . COLONOSCOPY     every 5 years  . COLONOSCOPY  05/30/2010   RMR:lax anal sphincter tone,anal  papilla,otherwise normal/left-sided diverticula  . ESOPHAGOGASTRODUODENOSCOPY  05/30/2010   [id number] ring/small HH otherwise normal  . EXTERNAL EAR SURGERY Left 12 years ago   skin graft from behind ear put in ear canal  . JOINT REPLACEMENT    . NECK SURGERY  10 years ago  . SKIN CANCER DESTRUCTION    . 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 TOTAL HIP ARTHROPLASTY Right 05/23/2015   Procedure: RIGHT TOTAL HIP ARTHROPLASTY ANTERIOR APPROACH;  Surgeon: Gaynelle Arabian, MD;  Location: [facility];  Service: Orthopedics;  Laterality: Right;   No Known Allergies No current facility-administered medications on file prior to encounter.   Current Outpatient Medications on File Prior to Encounter  Medication Sig Dispense Refill  . aspirin EC 81 MG tablet Take 81 mg by mouth daily.    . cholecalciferol (VITAMIN D3) 25 MCG (1000 UT) tablet Take 1,000 Units by mouth daily.    Marland Kitchen escitalopram (LEXAPRO) 20 MG tablet TAKE (1) TABLET BY MOUTH ONCE DAILY. 30 tablet 5  . levETIRAcetam (KEPPRA) 1000 MG tablet TAKE 1/2 TABLET IN THE MORNING AND 1&1/2 TABLETS IN THE EVENING. 180 tablet 0  . levothyroxine (SYNTHROID) 50 MCG  tablet TAKE ONE TABLET BY MOUTH ONCE DAILY. (Patient taking differently: Take 50 mcg by mouth daily before breakfast. ) 90 tablet 0  . LORazepam (ATIVAN) 1 MG tablet TAKE (1) TABLET BY MOUTH TWICE DAILY AS NEEDED FOR ANXIETY. 60 tablet 0  . OLANZapine (ZYPREXA) 5 MG tablet TAKE (1) TABLET BY MOUTH AT BEDTIME. (Patient taking differently: Take 5 mg by mouth daily. ) 30 tablet 3  . omeprazole (PRILOSEC) 40 MG capsule TAKE (1) CAPSULE BY MOUTH TWICE DAILY. (Patient taking differently: Take 40 mg by mouth 2 (two) times a day. ) 60 capsule 11  . Oxcarbazepine (TRILEPTAL) 300 MG tablet TAKE (1) TABLET BY MOUTH TWICE DAILY. (Patient taking  differently: Take 300 mg by mouth 2 (two) times daily. ) 60 tablet 3  . valACYclovir (VALTREX) 500 MG tablet TAKE ONE TABLET BY MOUTH ONCE DAILY. (Patient taking differently: Take 500 mg by mouth daily as needed (for infection). ) 30 tablet 3  . vitamin B-12 (CYANOCOBALAMIN) 1000 MCG tablet Take 1,000 mcg by mouth daily.    . [DISCONTINUED] LINZESS 72 MCG capsule TAKE ONE CAPSULE ORALLY EVERY MORNING BEFORE BREAKFAST. (Patient taking differently: Take 72 mcg by mouth daily before breakfast. ) 30 capsule 1  . [DISCONTINUED] traZODone (DESYREL) 100 MG tablet TAKE (1) TABLET BY MOUTH AT BEDTIME. 30 tablet 0   Social History   Socioeconomic History  . Marital status: Divorced    Spouse name: Not on file  . Number of children: 1  . Years of education: Not on file  . Highest education level: Not on file  Occupational History  . Occupation: disabled  Tobacco Use  . Smoking status: Current Every Day Smoker    Packs/day: 0.50    Years: 30.00    Pack years: 15.00    Types: Cigarettes  . Smokeless tobacco: Never Used  . Tobacco comment: trying to quit  Substance and Sexual Activity  . Alcohol use: No    Alcohol/week: 0.0 standard drinks  . Drug use: No    Comment: hx cocaine abuse  . Sexual activity: Never    Birth control/protection: None  Other Topics Concern  . Not on file  Social History Narrative   Patient lives at home alone and she is single.    Disabled.   Education college education.   Right handed.   Caffeine mountain dew four daily.    Social Determinants of Health   Financial Resource Strain:   . Difficulty of Paying Living Expenses:   Food Insecurity:   . Worried About Charity fundraiser in the Last Year:   . Arboriculturist in the Last Year:   Transportation Needs:   . Film/video editor (Medical):   Marland Kitchen Lack of Transportation (Non-Medical):   Physical Activity:   . Days of Exercise per Week:   . Minutes of Exercise per Session:   Stress:   . Feeling of  Stress :   Social Connections:   . Frequency of Communication with Friends and Family:   . Frequency of Social Gatherings with Friends and Family:   . Attends Religious Services:   . Active Member of Clubs or Organizations:   . Attends Archivist Meetings:   Marland Kitchen Marital Status:   Intimate Partner Violence:   . Fear of Current or Ex-Partner:   . Emotionally Abused:   Marland Kitchen Physically Abused:   . Sexually Abused:    Family History  Problem Relation Age of Onset  . Diabetes Mother   .  Hypertension Mother   . Colon cancer Father 60       living   . Hypertension Father   . Hypertension Sister   . Heart attack Brother   . Seizures Maternal Aunt     OBJECTIVE:  Vitals:   02/29/20 1455  BP: 119/79  Pulse: 86  Resp: 18  Temp: 98.1 F (36.7 C)  SpO2: 97%   General appearance: Alert in no acute distress HEENT: NCAT.  Oropharynx clear.  Lungs: clear to auscultation bilaterally without adventitious breath sounds Heart: regular rate and rhythm.   Abdomen: soft; non-distended; no tenderness; bowel sounds present; no guarding Back: no CVA tenderness Extremities: no edema; symmetrical with no gross deformities Skin: warm and dry Neurologic: Ambulates from chair to exam table without difficulty Psychological: alert and cooperative; normal mood and affect  Labs Reviewed  POCT URINALYSIS DIP (MANUAL ENTRY) - Abnormal; Notable for the following components:      Result Value   Glucose, UA =100 (*)    Spec Grav, UA >=1.030 (*)    Protein Ur, POC =100 (*)    Leukocytes, UA Trace (*)    All other components within normal limits  URINE CULTURE  CERVICOVAGINAL ANCILLARY ONLY    ASSESSMENT & PLAN:  1. Decreased urine output   2. Abnormal urine odor   3. Vaginal odor     Meds ordered this encounter  Medications  . tamsulosin (FLOMAX) 0.4 MG CAPS capsule    Sig: Take 1 capsule (0.4 mg total) by mouth daily.    Dispense:  15 capsule    Refill:  0    Order Specific  Question:   Supervising Provider    Answer:   Raylene Everts [id number]   Urine did not show signs of infection Urine culture sent.  We will call you with abnormal results.  Vaginal swab obtained.  We will follow up with you regarding abnormal results.    Push fluids and get plenty of rest.  Drink at least half your body weigh in ounces Flomax to help facilitate urine output Follow up with PCP if symptoms persists Return here or go to ER if you have any new or worsening symptoms such as fever, worsening abdominal pain, nausea/vomiting, flank pain, etc...  Outlined signs and symptoms indicating need for more acute intervention. Patient verbalized understanding. After Visit Summary given.     Lestine Box, PA-C 02/29/20 1519

## 2020-02-29 NOTE — ED Triage Notes (Signed)
Patient has had symptoms for 2 weeks..  Urinating smaller amounts, urine has strong odor.  Denies urinating any more frequently.  Denies back pain, but has had an isolated episode of lower abdominal pain.

## 2020-02-29 NOTE — Discharge Instructions (Addendum)
Urine did not show signs of infection Urine culture sent.  We will call you with abnormal results.  Vaginal swab obtained.  We will follow up with you regarding abnormal results.    Push fluids and get plenty of rest.  Drink at least half your body weigh in ounces Flomax to help facilitate urine output Follow up with PCP if symptoms persists Return here or go to ER if you have any new or worsening symptoms such as fever, worsening abdominal pain, nausea/vomiting, flank pain, etc..Marland Kitchen

## 2020-03-01 LAB — CERVICOVAGINAL ANCILLARY ONLY
Bacterial Vaginitis (gardnerella): POSITIVE — AB
Candida Glabrata: POSITIVE — AB
Candida Vaginitis: POSITIVE — AB
Chlamydia: NEGATIVE
Comment: NEGATIVE
Comment: NEGATIVE
Comment: NEGATIVE
Comment: NEGATIVE
Comment: NEGATIVE
Comment: NORMAL
Neisseria Gonorrhea: NEGATIVE
Trichomonas: NEGATIVE

## 2020-03-01 LAB — URINE CULTURE: Culture: NO GROWTH

## 2020-03-23 ENCOUNTER — Other Ambulatory Visit: Payer: Self-pay | Admitting: Family Medicine

## 2020-03-26 ENCOUNTER — Other Ambulatory Visit: Payer: Self-pay | Admitting: Family Medicine

## 2020-03-26 DIAGNOSIS — K219 Gastro-esophageal reflux disease without esophagitis: Secondary | ICD-10-CM

## 2020-03-26 NOTE — Telephone Encounter (Signed)
Requesting refill    Lorazepam  LOV: 12/14/2018  LRF:  02/23/2020

## 2020-03-27 ENCOUNTER — Other Ambulatory Visit: Payer: Self-pay | Admitting: Family Medicine

## 2020-03-27 MED ORDER — LORAZEPAM 1 MG PO TABS: ORAL_TABLET | ORAL | 0 refills | Status: DC

## 2020-03-27 NOTE — Addendum Note (Signed)
Addended by: Jenna Luo T on: 03/27/2020 01:59 PM   Modules accepted: Orders

## 2020-03-27 NOTE — Addendum Note (Signed)
Addended by: Tenia Goh B on: 03/27/2020 07:57 AM   Modules accepted: Orders  

## 2020-04-05 ENCOUNTER — Ambulatory Visit: Payer: Medicaid Other | Admitting: Neurology

## 2020-04-11 ENCOUNTER — Other Ambulatory Visit: Payer: Self-pay | Admitting: Family Medicine

## 2020-04-24 ENCOUNTER — Other Ambulatory Visit: Payer: Self-pay | Admitting: Family Medicine

## 2020-04-24 DIAGNOSIS — K219 Gastro-esophageal reflux disease without esophagitis: Secondary | ICD-10-CM

## 2020-04-25 NOTE — Telephone Encounter (Signed)
Ok to refill??  Last office visit 12/14/2018.  Last refill 03/27/2020.  Of note, letter sent to patient to scheduled OV.

## 2020-05-02 ENCOUNTER — Other Ambulatory Visit: Payer: Self-pay

## 2020-05-02 ENCOUNTER — Telehealth: Payer: Self-pay | Admitting: Family Medicine

## 2020-05-02 MED ORDER — LORAZEPAM 1 MG PO TABS: ORAL_TABLET | ORAL | 0 refills | Status: DC

## 2020-05-02 NOTE — Telephone Encounter (Signed)
Last refill 03-27-20 With only 3 left pills

## 2020-05-02 NOTE — Addendum Note (Signed)
Addended by: Saundra Shelling on: 05/02/2020 04:35 PM   Modules accepted: Orders

## 2020-05-02 NOTE — Telephone Encounter (Signed)
CB# [phone] Refill [facility]  schedule CPE on 05-17-20 only have 3 pills

## 2020-05-14 ENCOUNTER — Ambulatory Visit (INDEPENDENT_AMBULATORY_CARE_PROVIDER_SITE_OTHER): Payer: Medicaid Other | Admitting: Family Medicine

## 2020-05-14 ENCOUNTER — Other Ambulatory Visit: Payer: Self-pay | Admitting: Family Medicine

## 2020-05-14 ENCOUNTER — Other Ambulatory Visit: Payer: Self-pay | Admitting: Adult Health

## 2020-05-14 ENCOUNTER — Ambulatory Visit (HOSPITAL_COMMUNITY)
Admission: RE | Admit: 2020-05-14 | Discharge: 2020-05-14 | Disposition: A | Discharge status: Home / Self Care | Payer: Medicaid Other | Source: Ambulatory Visit | Attending: Family Medicine | Admitting: Family Medicine

## 2020-05-14 ENCOUNTER — Other Ambulatory Visit: Payer: Self-pay

## 2020-05-14 VITALS — BP 130/68 | HR 107 | Temp 95.3°F | Ht 66.0 in | Wt 179.0 lb

## 2020-05-14 DIAGNOSIS — M79671 Pain in right foot: Secondary | ICD-10-CM | POA: Diagnosis present

## 2020-05-14 DIAGNOSIS — Z1159 Encounter for screening for other viral diseases: Secondary | ICD-10-CM

## 2020-05-14 DIAGNOSIS — Z124 Encounter for screening for malignant neoplasm of cervix: Secondary | ICD-10-CM

## 2020-05-14 DIAGNOSIS — Z0000 Encounter for general adult medical examination without abnormal findings: Secondary | ICD-10-CM

## 2020-05-14 DIAGNOSIS — Z0001 Encounter for general adult medical examination with abnormal findings: Secondary | ICD-10-CM | POA: Diagnosis not present

## 2020-05-14 DIAGNOSIS — F319 Bipolar disorder, unspecified: Secondary | ICD-10-CM

## 2020-05-14 DIAGNOSIS — Z1231 Encounter for screening mammogram for malignant neoplasm of breast: Secondary | ICD-10-CM

## 2020-05-14 DIAGNOSIS — E038 Other specified hypothyroidism: Secondary | ICD-10-CM

## 2020-05-14 DIAGNOSIS — G40209 Localization-related (focal) (partial) symptomatic epilepsy and epileptic syndromes with complex partial seizures, not intractable, without status epilepticus: Secondary | ICD-10-CM

## 2020-05-14 DIAGNOSIS — Z1211 Encounter for screening for malignant neoplasm of colon: Secondary | ICD-10-CM

## 2020-05-14 NOTE — Progress Notes (Signed)
Subjective:    Patient ID: Sheryl Hall, female    DOB: [birth year], 59 y.o.   MRN: [id number]  Patient is a 59 year old Caucasian female who is here today for complete physical exam.  Her only medical concern is midfoot pain in the dorsal right foot secondary to a sprain that occurred 5 weeks ago.  She continues to have pain in the middle of her foot whenever she bears weight.  She is overdue for mammogram.  She is overdue for a Pap smear.  She is due for colonoscopy this year.  She is also overdue for fasting lab work.  Past medical history significant for bipolar 1 disorder which she states is currently well controlled on a combination of Lexapro, Trileptal as a mood stabilizer, and zyprexa.  She also has a history of seizures however she remains seizure-free on Keppra.  She also has a history of hypothyroidism on levothyroxine.   Past Medical History:  Diagnosis Date  . Anxiety   . Arthritis   . Avascular necrosis of hip ([place])    left  . Bipolar 1 disorder ([place])   . Chronic left hip pain   . Chronic nausea   . Depression   . Diverticulosis of colon   . Family hx of colon cancer    age 68-father  . Folate deficiency   . GERD (gastroesophageal reflux disease)   . Headache   . Heart murmur   . Hx of abnormal Pap smear   . Hyperlipidemia   . Hyperthyroidism   . Keratosis, actinic   . Low back pain 03/01/2013   MRI with multiple levels of disc bulge  . Panic attacks   . Polysubstance abuse ([place])   . PTSD (post-traumatic stress disorder)   . S/P colonoscopy 05/30/2010   LAX sphincter tone, anal papilla, left-sided diverticulosis, normal random biopsies,, 1 polyp-TA  . S/P endoscopy 05/30/2010   Dr Rourk-> non--critical Schatzki's ring, s/p 26F dilation  . Seizures ([place])   . Stroke [facility]) 2014   Right parietal, no deficits   . Tubular adenoma of colon 05/30/2010   Next colonoscopy 05/2015  . UTI (urinary tract infection)    Past Surgical History:  Procedure Laterality Date  .  COLONOSCOPY     every 5 years  . COLONOSCOPY  05/30/2010   RMR:lax anal sphincter tone,anal papilla,otherwise normal/left-sided diverticula  . ESOPHAGOGASTRODUODENOSCOPY  05/30/2010   [id number] ring/small HH otherwise normal  . EXTERNAL EAR SURGERY Left 12 years ago   skin graft from behind ear put in ear canal  . JOINT REPLACEMENT    . NECK SURGERY  10 years ago  . SKIN CANCER DESTRUCTION    . 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 TOTAL HIP ARTHROPLASTY Right 05/23/2015   Procedure: RIGHT TOTAL HIP ARTHROPLASTY ANTERIOR APPROACH;  Surgeon: Gaynelle Arabian, MD;  Location: [facility];  Service: Orthopedics;  Laterality: Right;   Current Outpatient Medications on File Prior to Visit  Medication Sig Dispense Refill  . escitalopram (LEXAPRO) 20 MG tablet TAKE (1) TABLET BY MOUTH ONCE DAILY. 30 tablet 5  . levETIRAcetam (KEPPRA) 1000 MG tablet TAKE 1/2 TABLET IN THE MORNING AND 1&1/2 TABLETS IN THE EVENING. 180 tablet 0  . levothyroxine (SYNTHROID) 50 MCG tablet TAKE ONE TABLET BY MOUTH ONCE DAILY. 90 tablet 0  . LORazepam (ATIVAN) 1 MG tablet TAKE (  1) TABLET BY MOUTH TWICE DAILY AS NEEDED FOR ANXIETY. 60 tablet 0  . OLANZapine (ZYPREXA) 5 MG tablet TAKE (1) TABLET BY MOUTH AT BEDTIME. (Patient taking differently: Take 5 mg by mouth daily. ) 30 tablet 3  . omeprazole (PRILOSEC) 40 MG capsule TAKE (1) CAPSULE BY MOUTH TWICE DAILY. 60 capsule 0  . Oxcarbazepine (TRILEPTAL) 300 MG tablet Take 1 tablet (300 mg total) by mouth 2 (two) times daily. Must have office visit before further refills 60 tablet 0  . valACYclovir (VALTREX) 500 MG tablet TAKE ONE TABLET BY MOUTH ONCE DAILY. 30 tablet 0  . aspirin EC 81 MG tablet Take 81 mg by mouth daily. (Patient not taking: Reported on 05/14/2020)    . cholecalciferol (VITAMIN D3) 25 MCG (1000 UT) tablet  Take 1,000 Units by mouth daily. (Patient not taking: Reported on 05/14/2020)    . tamsulosin (FLOMAX) 0.4 MG CAPS capsule Take 1 capsule (0.4 mg total) by mouth daily. (Patient not taking: Reported on 05/14/2020) 15 capsule 0  . vitamin B-12 (CYANOCOBALAMIN) 1000 MCG tablet Take 1,000 mcg by mouth daily. (Patient not taking: Reported on 05/14/2020)    . [DISCONTINUED] LINZESS 72 MCG capsule TAKE ONE CAPSULE ORALLY EVERY MORNING BEFORE BREAKFAST. (Patient taking differently: Take 72 mcg by mouth daily before breakfast. ) 30 capsule 1  . [DISCONTINUED] traZODone (DESYREL) 100 MG tablet TAKE (1) TABLET BY MOUTH AT BEDTIME. 30 tablet 0   No current facility-administered medications on file prior to visit.   No Known Allergies Social History   Socioeconomic History  . Marital status: Divorced    Spouse name: Not on file  . Number of children: 1  . Years of education: Not on file  . Highest education level: Not on file  Occupational History  . Occupation: disabled  Tobacco Use  . Smoking status: Current Every Day Smoker    Packs/day: 0.50    Years: 30.00    Pack years: 15.00    Types: Cigarettes  . Smokeless tobacco: Never Used  . Tobacco comment: trying to quit  Vaping Use  . Vaping Use: Never used  Substance and Sexual Activity  . Alcohol use: No    Alcohol/week: 0.0 standard drinks  . Drug use: No    Comment: hx cocaine abuse  . Sexual activity: Never    Birth control/protection: None  Other Topics Concern  . Not on file  Social History Narrative   Patient lives at home alone and she is single.    Disabled.   Education college education.   Right handed.   Caffeine mountain dew four daily.    Social Determinants of Health   Financial Resource Strain:   . Difficulty of Paying Living Expenses:   Food Insecurity:   . Worried About Charity fundraiser in the Last Year:   . Arboriculturist in the Last Year:   Transportation Needs:   . Film/video editor (Medical):   Marland Kitchen  Lack of Transportation (Non-Medical):   Physical Activity:   . Days of Exercise per Week:   . Minutes of Exercise per Session:   Stress:   . Feeling of Stress :   Social Connections:   . Frequency of Communication with Friends and Family:   . Frequency of Social Gatherings with Friends and Family:   . Attends Religious Services:   . Active Member of Clubs or Organizations:   . Attends Archivist Meetings:   Marland Kitchen Marital Status:   Intimate  Partner Violence:   . Fear of Current or Ex-Partner:   . Emotionally Abused:   Marland Kitchen Physically Abused:   . Sexually Abused:      Review of Systems  All other systems reviewed and are negative.      Objective:   Physical Exam Vitals reviewed.  Constitutional:      General: She is not in acute distress.    Appearance: She is well-developed. She is not diaphoretic.  HENT:     Right Ear: External ear normal.     Left Ear: External ear normal.     Nose: Nose normal.     Mouth/Throat:     Pharynx: No oropharyngeal exudate.  Neck:     Thyroid: No thyromegaly.     Vascular: No JVD.  Cardiovascular:     Rate and Rhythm: Normal rate and regular rhythm.     Heart sounds: Normal heart sounds. No murmur heard.   Pulmonary:     Effort: Pulmonary effort is normal. No respiratory distress.     Breath sounds: Normal breath sounds. No wheezing or rales.  Abdominal:     General: Bowel sounds are normal. There is no distension.     Palpations: Abdomen is soft.     Tenderness: There is no abdominal tenderness. There is no guarding or rebound.  Musculoskeletal:     Cervical back: Neck supple.  Lymphadenopathy:     Cervical: No cervical adenopathy.  Skin:    Coloration: Skin is not pale.     Findings: No erythema or rash.  Neurological:     Mental Status: She is alert and oriented to person, place, and time.     Cranial Nerves: No cranial nerve deficit.     Motor: No abnormal muscle tone.     Coordination: Coordination normal.    Psychiatric:        Behavior: Behavior normal.        Thought Content: Thought content normal.        Judgment: Judgment normal.           Assessment & Plan:  Other specified hypothyroidism - Plan: CBC with Differential/Platelet, COMPLETE METABOLIC PANEL WITH GFR, Lipid panel, TSH  Encounter for hepatitis C screening test for low risk patient - Plan: Hepatitis C Antibody  Acute foot pain, right - Plan: DG Foot Complete Right  Colon cancer screening - Plan: Ambulatory referral to Gastroenterology  Cervical cancer screening - Plan: PAP, Thin Prep w/HPV rflx HPV Type 16/18  Bipolar I disorder (HCC)  Partial epilepsy with impairment of consciousness ([place])  General medical exam  From the standpoint of her bipolar disorder, the patient states that her current regimen of medication is working well.  She does not want to make any changes at the present time.  I will check a TSH to ensure adequate treatment of her hypothyroidism with levothyroxine.  I will screen the patient for hepatitis C.  I will schedule the patient to meet with her GI doctor to be screened for colon cancer.  Pap smear was performed today and was sent to pathology in a labeled container.  I will schedule the patient for a mammogram.  I believe she sprained her midfoot 5 weeks ago when she twisted her ankle.  I do not see any evidence of a fracture on today's exam however I will obtain an x-ray of the foot.

## 2020-05-15 ENCOUNTER — Encounter: Payer: Self-pay | Admitting: Internal Medicine

## 2020-05-15 LAB — CBC WITH DIFFERENTIAL/PLATELET
Absolute Monocytes: 689 cells/uL (ref 200–950)
Basophils Absolute: 50 cells/uL (ref 0–200)
Basophils Relative: 0.6 %
Eosinophils Absolute: 252 cells/uL (ref 15–500)
Eosinophils Relative: 3 %
HCT: 39.8 % (ref 35.0–45.0)
Hemoglobin: 12.6 g/dL (ref 11.7–15.5)
Lymphs Abs: 2764 cells/uL (ref 850–3900)
MCH: 26.9 pg — ABNORMAL LOW (ref 27.0–33.0)
MCHC: 31.7 g/dL — ABNORMAL LOW (ref 32.0–36.0)
MCV: 84.9 fL (ref 80.0–100.0)
MPV: 11.2 fL (ref 7.5–12.5)
Monocytes Relative: 8.2 %
Neutro Abs: 4645 cells/uL (ref 1500–7800)
Neutrophils Relative %: 55.3 %
Platelets: 364 10*3/uL (ref 140–400)
RBC: 4.69 10*6/uL (ref 3.80–5.10)
RDW: 17 % — ABNORMAL HIGH (ref 11.0–15.0)
Total Lymphocyte: 32.9 %
WBC: 8.4 10*3/uL (ref 3.8–10.8)

## 2020-05-15 LAB — COMPLETE METABOLIC PANEL WITH GFR
AG Ratio: 1.5 (calc) (ref 1.0–2.5)
ALT: 10 U/L (ref 6–29)
AST: 13 U/L (ref 10–35)
Albumin: 3.5 g/dL — ABNORMAL LOW (ref 3.6–5.1)
Alkaline phosphatase (APISO): 104 U/L (ref 37–153)
BUN/Creatinine Ratio: 5 (calc) — ABNORMAL LOW (ref 6–22)
BUN: 5 mg/dL — ABNORMAL LOW (ref 7–25)
CO2: 20 mmol/L (ref 20–32)
Calcium: 8.6 mg/dL (ref 8.6–10.4)
Chloride: 107 mmol/L (ref 98–110)
Creat: 1.05 mg/dL (ref 0.50–1.05)
GFR, Est African American: 68 mL/min/{1.73_m2} (ref >=60)
GFR, Est Non African American: 58 mL/min/{1.73_m2} — ABNORMAL LOW (ref >=60)
Globulin: 2.4 g/dL (calc) (ref 1.9–3.7)
Glucose, Bld: 93 mg/dL (ref 65–99)
Potassium: 3.9 mmol/L (ref 3.5–5.3)
Sodium: 143 mmol/L (ref 135–146)
Total Bilirubin: 0.2 mg/dL (ref 0.2–1.2)
Total Protein: 5.9 g/dL — ABNORMAL LOW (ref 6.1–8.1)

## 2020-05-15 LAB — TSH: TSH: 2.93 mIU/L (ref 0.40–4.50)

## 2020-05-15 LAB — HEPATITIS C ANTIBODY
Hepatitis C Ab: NONREACTIVE
SIGNAL TO CUT-OFF: 0 (ref <1.00)

## 2020-05-15 LAB — LIPID PANEL
Cholesterol: 281 mg/dL — ABNORMAL HIGH (ref <200)
HDL: 48 mg/dL — ABNORMAL LOW (ref >=50)
LDL Cholesterol (Calc): 190 mg/dL (calc) — ABNORMAL HIGH
Non-HDL Cholesterol (Calc): 233 mg/dL (calc) — ABNORMAL HIGH (ref <130)
Total CHOL/HDL Ratio: 5.9 (calc) — ABNORMAL HIGH (ref <5.0)
Triglycerides: 233 mg/dL — ABNORMAL HIGH (ref <150)

## 2020-05-16 LAB — PAP, TP IMAGING W/ HPV RNA, RFLX HPV TYPE 16,18/45: HPV DNA High Risk: NOT DETECTED

## 2020-05-24 ENCOUNTER — Other Ambulatory Visit: Payer: Self-pay | Admitting: Family Medicine

## 2020-05-24 DIAGNOSIS — K219 Gastro-esophageal reflux disease without esophagitis: Secondary | ICD-10-CM

## 2020-05-25 ENCOUNTER — Other Ambulatory Visit: Payer: Self-pay | Admitting: Family Medicine

## 2020-06-05 ENCOUNTER — Other Ambulatory Visit: Payer: Self-pay | Admitting: Family Medicine

## 2020-06-06 NOTE — Telephone Encounter (Signed)
Ok to refill??  Last office visit 05/14/2020.  Last refill 05/02/2020.

## 2020-06-13 ENCOUNTER — Other Ambulatory Visit: Payer: Self-pay | Admitting: Adult Health

## 2020-06-13 NOTE — Telephone Encounter (Signed)
Patient has FU on 06/25/20, refilled x 1 month with note to pharmacy: must keep FU on 06/25/20.

## 2020-06-25 ENCOUNTER — Ambulatory Visit: Payer: Medicaid Other | Admitting: Neurology

## 2020-06-25 NOTE — Progress Notes (Deleted)
PATIENT: Sheryl Hall DOB: 1961-06-28  REASON FOR VISIT: follow up HISTORY FROM: patient  HISTORY OF PRESENT ILLNESS: Today 06/25/20  HISTORY 09/14/2018 MM: Sheryl Hall is a 59 year old female with a history of seizures and memory disturbance.  She returns today for follow-up.  She denies any seizure events.  She continues on Keppra.  She feels that her memory has remained stable.  She lives at home alone.  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  She manages her own finances.  She prepares most without difficulty.  She manages her own medication and appointments.  She returns today for evaluation.   REVIEW OF SYSTEMS: Out of a complete 14 system review of symptoms, the patient complains only of the following symptoms, and all other reviewed systems are negative.  ALLERGIES: No Known Allergies  HOME MEDICATIONS: Outpatient Medications Prior to Visit  Medication Sig Dispense Refill  . aspirin EC 81 MG tablet Take 81 mg by mouth daily. (Patient not taking: Reported on 05/14/2020)    . cholecalciferol (VITAMIN D3) 25 MCG (1000 UT) tablet Take 1,000 Units by mouth daily. (Patient not taking: Reported on 05/14/2020)    . escitalopram (LEXAPRO) 20 MG tablet TAKE (1) TABLET BY MOUTH ONCE DAILY. 30 tablet 5  . levETIRAcetam (KEPPRA) 1000 MG tablet TAKE 1/2 TABLET IN THE MORNING AND 1&1/2 TABLETS IN THE EVENING. 60 tablet 0  . levothyroxine (SYNTHROID) 50 MCG tablet TAKE ONE TABLET BY MOUTH ONCE DAILY. 90 tablet 0  . LORazepam (ATIVAN) 1 MG tablet TAKE (1) TABLET BY MOUTH TWICE DAILY AS NEEDED FOR ANXIETY. 60 tablet 0  . OLANZapine (ZYPREXA) 5 MG tablet Take 1 tablet (5 mg total) by mouth daily. 30 tablet 3  . omeprazole (PRILOSEC) 40 MG capsule TAKE (1) CAPSULE BY MOUTH TWICE DAILY. 60 capsule 5  . Oxcarbazepine (TRILEPTAL) 300 MG tablet TAKE (1) TABLET BY MOUTH TWICE DAILY. 60 tablet 0  . tamsulosin (FLOMAX) 0.4 MG CAPS capsule Take 1 capsule (0.4 mg  total) by mouth daily. (Patient not taking: Reported on 05/14/2020) 15 capsule 0  . valACYclovir (VALTREX) 500 MG tablet TAKE ONE TABLET BY MOUTH ONCE DAILY. 30 tablet 5  . vitamin B-12 (CYANOCOBALAMIN) 1000 MCG tablet Take 1,000 mcg by mouth daily. (Patient not taking: Reported on 05/14/2020)     No facility-administered medications prior to visit.    PAST MEDICAL HISTORY: Past Medical History:  Diagnosis Date  . Anxiety   . Arthritis   . Avascular necrosis of hip ([place])    left  . Bipolar 1 disorder ([place])   . Chronic left hip pain   . Chronic nausea   . Depression   . Diverticulosis of colon   . Family hx of colon cancer    age 60-father  . Folate deficiency   . GERD (gastroesophageal reflux disease)   . Headache   . Heart murmur   . Hx of abnormal Pap smear   . Hyperlipidemia   . Hyperthyroidism   . Keratosis, actinic   . Low back pain 03/01/2013   MRI with multiple levels of disc bulge  . Panic attacks   . Polysubstance abuse ([place])   . PTSD (post-traumatic stress disorder)   . S/P colonoscopy 05/30/2010   LAX sphincter tone, anal papilla, left-sided diverticulosis, normal random biopsies,, 1 polyp-TA  . S/P endoscopy 05/30/2010   Dr Rourk-> non--critical Schatzki's ring, s/p 60F dilation  . Seizures ([place])   . Stroke (  [place]) 2014   Right parietal, no deficits   . Tubular adenoma of colon 05/30/2010   Next colonoscopy 05/2015  . UTI (urinary tract infection)     PAST SURGICAL HISTORY: Past Surgical History:  Procedure Laterality Date  . COLONOSCOPY     every 5 years  . COLONOSCOPY  05/30/2010   RMR:lax anal sphincter tone,anal papilla,otherwise normal/left-sided diverticula  . ESOPHAGOGASTRODUODENOSCOPY  05/30/2010   IRJ:[id number] ring/small HH otherwise normal  . EXTERNAL EAR SURGERY Left 12 years ago   skin graft from behind ear put in ear canal  . JOINT REPLACEMENT    . NECK SURGERY  10 years ago  . SKIN CANCER DESTRUCTION    . TOTAL HIP ARTHROPLASTY Left  04/19/2014   Procedure: LEFT TOTAL HIP ARTHROPLASTY ANTERIOR APPROACH;  Surgeon: [name], MD;  Location: [facility];  Service: Orthopedics;  Laterality: Left;  . TOTAL HIP ARTHROPLASTY Right 05/23/2015   Procedure: RIGHT TOTAL HIP ARTHROPLASTY ANTERIOR APPROACH;  Surgeon: Gaynelle Arabian, MD;  Location: [facility];  Service: Orthopedics;  Laterality: Right;    FAMILY HISTORY: Family History  Problem Relation Age of Onset  . Diabetes Mother   . Hypertension Mother   . Colon cancer Father 66       living   . Hypertension Father   . Hypertension Sister   . Heart attack Brother   . Seizures Maternal Aunt     SOCIAL HISTORY: Social History   Socioeconomic History  . Marital status: Divorced    Spouse name: Not on file  . Number of children: 1  . Years of education: Not on file  . Highest education level: Not on file  Occupational History  . Occupation: disabled  Tobacco Use  . Smoking status: Current Every Day Smoker    Packs/day: 0.50    Years: 30.00    Pack years: 15.00    Types: Cigarettes  . Smokeless tobacco: Never Used  . Tobacco comment: trying to quit  Vaping Use  . Vaping Use: Never used  Substance and Sexual Activity  . Alcohol use: No    Alcohol/week: 0.0 standard drinks  . Drug use: No    Comment: hx cocaine abuse  . Sexual activity: Never    Birth control/protection: None  Other Topics Concern  . Not on file  Social History Narrative   Patient lives at home alone and she is single.    Disabled.   Education college education.   Right handed.   Caffeine mountain dew four daily.    Social Determinants of Health   Financial Resource Strain:   . Difficulty of Paying Living Expenses:   Food Insecurity:   . Worried About Charity fundraiser in the Last Year:   . Arboriculturist in the Last Year:   Transportation Needs:   . Film/video editor (Medical):   Marland Kitchen Lack of Transportation (Non-Medical):   Physical Activity:   . Days of Exercise per Week:   .  Minutes of Exercise per Session:   Stress:   . Feeling of Stress :   Social Connections:   . Frequency of Communication with Friends and Family:   . Frequency of Social Gatherings with Friends and Family:   . Attends Religious Services:   . Active Member of Clubs or Organizations:   . Attends Archivist Meetings:   Marland Kitchen Marital Status:   Intimate Partner Violence:   . Fear of Current or Ex-Partner:   . Emotionally Abused:   .  Physically Abused:   . Sexually Abused:       PHYSICAL EXAM  There were no vitals filed for this visit. There is no height or weight on file to calculate BMI.  Generalized: Well developed, in no acute distress   Neurological examination  Mentation: Alert oriented to time, place, history taking. Follows all commands speech and language fluent Cranial nerve II-XII: Pupils were equal round reactive to light. Extraocular movements were full, visual field were full on confrontational test. Facial sensation and strength were normal. Uvula tongue midline. Head turning and shoulder shrug  were normal and symmetric. Motor: The motor testing reveals 5 over 5 strength of all 4 extremities. Good symmetric motor tone is noted throughout.  Sensory: Sensory testing is intact to soft touch on all 4 extremities. No evidence of extinction is noted.  Coordination: Cerebellar testing reveals good finger-nose-finger and heel-to-shin bilaterally.  Gait and station: Gait is normal. Tandem gait is normal. Romberg is negative. No drift is seen.  Reflexes: Deep tendon reflexes are symmetric and normal bilaterally.   DIAGNOSTIC DATA (LABS, IMAGING, TESTING) - I reviewed patient records, labs, notes, testing and imaging myself where available.  Lab Results  Component Value Date   WBC 8.4 05/14/2020   HGB 12.6 05/14/2020   HCT 39.8 05/14/2020   MCV 84.9 05/14/2020   PLT 364 05/14/2020      Component Value Date/Time   NA 143 05/14/2020 1528   NA 142 09/07/2017 1459    NA 144 10/27/2014 0625   K 3.9 05/14/2020 1528   K 4.2 10/27/2014 0625   K 4.4 08/27/2011 1314   CL 107 05/14/2020 1528   CL 107 10/27/2014 0625   CL 102 08/27/2011 1314   CO2 20 05/14/2020 1528   CO2 29 10/27/2014 0625   CO2 25 08/27/2011 1314   GLUCOSE 93 05/14/2020 1528   GLUCOSE 94 10/27/2014 0625   BUN 5 (L) 05/14/2020 1528   BUN 7 09/07/2017 1459   BUN 10 10/27/2014 0625   CREATININE 1.05 05/14/2020 1528   CALCIUM 8.6 05/14/2020 1528   CALCIUM 8.9 10/27/2014 0625   CALCIUM 9.9 08/27/2011 1314   PROT 5.9 (L) 05/14/2020 1528   PROT 6.1 09/07/2017 1459   PROT 5.9 (L) 10/27/2014 0625   ALBUMIN 3.1 (L) 07/21/2019 1723   ALBUMIN 4.1 09/07/2017 1459   ALBUMIN 3.3 (L) 10/27/2014 0625   AST 13 05/14/2020 1528   AST 16 10/27/2014 0625   AST 11 08/27/2011 1314   ALT 10 05/14/2020 1528   ALT 14 10/27/2014 0625   ALKPHOS 128 (H) 07/21/2019 1723   ALKPHOS 119 (H) 10/27/2014 0625   ALKPHOS 91 08/27/2011 1314   BILITOT 0.2 05/14/2020 1528   BILITOT <0.2 09/07/2017 1459   BILITOT 0.4 10/27/2014 0625   BILITOT 0.5 08/27/2011 1314   GFRNONAA 58 (L) 05/14/2020 1528   GFRAA 68 05/14/2020 1528   Lab Results  Component Value Date   CHOL 281 (H) 05/14/2020   HDL 48 (L) 05/14/2020   LDLCALC 190 (H) 05/14/2020   TRIG 233 (H) 05/14/2020   CHOLHDL 5.9 (H) 05/14/2020   No results found for: HGBA1C Lab Results  Component Value Date   VITAMINB12 1,089 12/14/2018   Lab Results  Component Value Date   TSH 2.93 05/14/2020      ASSESSMENT AND PLAN 59 y.o. year old female  has a past medical history of Anxiety, Arthritis, Avascular necrosis of hip ([place]), Bipolar 1 disorder ([place]), Chronic left hip pain, Chronic  nausea, Depression, Diverticulosis of colon, Family hx of colon cancer, Folate deficiency, GERD (gastroesophageal reflux disease), Headache, Heart murmur, abnormal Pap smear, Hyperlipidemia, Hyperthyroidism, Keratosis, actinic, Low back pain (03/01/2013), Panic attacks,  Polysubstance abuse ([place]), PTSD (post-traumatic stress disorder), S/P colonoscopy (05/30/2010), S/P endoscopy (05/30/2010), Seizures ([place]), Stroke ([place]) (2014), Tubular adenoma of colon (05/30/2010), and UTI (urinary tract infection). here with ***   I spent 15 minutes with the patient. 50% of this time was spent   Butler Denmark, Index, Lane 06/25/2020, 5:38 AM [facility] [address] [place], [place] [address] [phone]

## 2020-07-04 ENCOUNTER — Ambulatory Visit (HOSPITAL_COMMUNITY)
Admission: RE | Admit: 2020-07-04 | Discharge: 2020-07-04 | Disposition: A | Discharge status: Home / Self Care | Payer: Medicaid Other | Source: Ambulatory Visit | Attending: Family Medicine | Admitting: Family Medicine

## 2020-07-04 ENCOUNTER — Other Ambulatory Visit: Payer: Self-pay

## 2020-07-04 DIAGNOSIS — Z1231 Encounter for screening mammogram for malignant neoplasm of breast: Secondary | ICD-10-CM | POA: Diagnosis not present

## 2020-07-10 ENCOUNTER — Other Ambulatory Visit: Payer: Self-pay | Admitting: Family Medicine

## 2020-07-11 NOTE — Telephone Encounter (Signed)
Requested Prescriptions   Pending Prescriptions Disp Refills  . LORazepam (ATIVAN) 1 MG tablet [Pharmacy Med Name: LORAZEPAM 1 MG TABLET] 60 tablet 0    Sig: TAKE (1) TABLET BY MOUTH TWICE DAILY AS NEEDED FOR ANXIETY.   Signed Prescriptions Disp Refills  . levothyroxine (SYNTHROID) 50 MCG tablet 90 tablet 3    Sig: TAKE ONE TABLET BY MOUTH ONCE DAILY.    Authorizing Provider: Susy Frizzle    Ordering User: Vanice Sarah     Last OV 05/14/2020  Last written 06/07/2020

## 2020-07-16 ENCOUNTER — Ambulatory Visit: Payer: Medicaid Other | Admitting: Gastroenterology

## 2020-07-18 ENCOUNTER — Ambulatory Visit: Payer: Medicaid Other | Admitting: Gastroenterology

## 2020-07-19 ENCOUNTER — Other Ambulatory Visit: Payer: Self-pay | Admitting: Family Medicine

## 2020-08-10 ENCOUNTER — Other Ambulatory Visit: Payer: Self-pay | Admitting: Family Medicine

## 2020-08-10 ENCOUNTER — Other Ambulatory Visit: Payer: Self-pay | Admitting: Adult Health

## 2020-08-10 NOTE — Telephone Encounter (Signed)
Ok to refill??  Last office visit 05/14/2020.  Last refill 07/12/2020.

## 2020-09-11 ENCOUNTER — Other Ambulatory Visit: Payer: Self-pay | Admitting: Adult Health

## 2020-09-11 ENCOUNTER — Other Ambulatory Visit: Payer: Self-pay | Admitting: Family Medicine

## 2020-09-11 NOTE — Telephone Encounter (Signed)
Ok to refill??  Last office visit 05/14/2020.  Last refill 08/10/2020.

## 2020-09-17 ENCOUNTER — Ambulatory Visit: Payer: Medicaid Other | Admitting: Neurology

## 2020-09-17 NOTE — Progress Notes (Deleted)
PATIENT: Sheryl Hall DOB: 06-Jan-1961  REASON FOR VISIT: follow up HISTORY FROM: patient  HISTORY OF PRESENT ILLNESS: Today 09/17/20 Sheryl Hall is a 59 year old female history of seizures and memory disturbance.  She remains on Keppra. HISTORY 09/14/2018 MM: Sheryl Hall is a 59 year old female with a history of seizures and memory disturbance.  She returns today for follow-up.  She denies any seizure events.  She continues on Keppra.  She feels that her memory has remained stable.  She lives at home alone.  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  She manages her own finances.  She prepares most without difficulty.  She manages her own medication and appointments.  She returns today for evaluation.   REVIEW OF SYSTEMS: Out of a complete 14 system review of symptoms, the patient complains only of the following symptoms, and all other reviewed systems are negative.  ALLERGIES: No Known Allergies  HOME MEDICATIONS: Outpatient Medications Prior to Visit  Medication Sig Dispense Refill  . aspirin EC 81 MG tablet Take 81 mg by mouth daily. (Patient not taking: Reported on 05/14/2020)    . cholecalciferol (VITAMIN D3) 25 MCG (1000 UT) tablet Take 1,000 Units by mouth daily. (Patient not taking: Reported on 05/14/2020)    . escitalopram (LEXAPRO) 20 MG tablet TAKE (1) TABLET BY MOUTH ONCE DAILY. 30 tablet 5  . levETIRAcetam (KEPPRA) 1000 MG tablet TAKE 1/2 TABLET IN THE MORNING AND 1&1/2 TABLETS IN THE EVENING. 60 tablet 0  . levothyroxine (SYNTHROID) 50 MCG tablet TAKE ONE TABLET BY MOUTH ONCE DAILY. 90 tablet 3  . LORazepam (ATIVAN) 1 MG tablet TAKE (1) TABLET BY MOUTH TWICE DAILY AS NEEDED FOR ANXIETY. 60 tablet 0  . OLANZapine (ZYPREXA) 5 MG tablet Take 1 tablet (5 mg total) by mouth daily. 30 tablet 3  . omeprazole (PRILOSEC) 40 MG capsule TAKE (1) CAPSULE BY MOUTH TWICE DAILY. 60 capsule 5  . Oxcarbazepine (TRILEPTAL) 300 MG tablet TAKE (1) TABLET  BY MOUTH TWICE DAILY. 60 tablet 2  . tamsulosin (FLOMAX) 0.4 MG CAPS capsule Take 1 capsule (0.4 mg total) by mouth daily. (Patient not taking: Reported on 05/14/2020) 15 capsule 0  . valACYclovir (VALTREX) 500 MG tablet TAKE ONE TABLET BY MOUTH ONCE DAILY. 30 tablet 5  . vitamin B-12 (CYANOCOBALAMIN) 1000 MCG tablet Take 1,000 mcg by mouth daily. (Patient not taking: Reported on 05/14/2020)     No facility-administered medications prior to visit.    PAST MEDICAL HISTORY: Past Medical History:  Diagnosis Date  . Anxiety   . Arthritis   . Avascular necrosis of hip ([place])    left  . Bipolar 1 disorder ([place])   . Chronic left hip pain   . Chronic nausea   . Depression   . Diverticulosis of colon   . Family hx of colon cancer    age 25-father  . Folate deficiency   . GERD (gastroesophageal reflux disease)   . Headache   . Heart murmur   . Hx of abnormal Pap smear   . Hyperlipidemia   . Hyperthyroidism   . Keratosis, actinic   . Low back pain 03/01/2013   MRI with multiple levels of disc bulge  . Panic attacks   . Polysubstance abuse ([place])   . PTSD (post-traumatic stress disorder)   . S/P colonoscopy 05/30/2010   LAX sphincter tone, anal papilla, left-sided diverticulosis, normal random biopsies,, 1 polyp-TA  . S/P endoscopy 05/30/2010  Dr Rourk-> non--critical Schatzki's ring, s/p 74F dilation  . Seizures ([place])   . Stroke [facility]) 2014   Right parietal, no deficits   . Tubular adenoma of colon 05/30/2010   Next colonoscopy 05/2015  . UTI (urinary tract infection)     PAST SURGICAL HISTORY: Past Surgical History:  Procedure Laterality Date  . COLONOSCOPY     every 5 years  . COLONOSCOPY  05/30/2010   RMR:lax anal sphincter tone,anal papilla,otherwise normal/left-sided diverticula  . ESOPHAGOGASTRODUODENOSCOPY  05/30/2010   YKD:[id number] ring/small HH otherwise normal  . EXTERNAL EAR SURGERY Left 12 years ago   skin graft from behind ear put in ear canal  . JOINT REPLACEMENT     . NECK SURGERY  10 years ago  . SKIN CANCER DESTRUCTION    . 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 TOTAL HIP ARTHROPLASTY Right 05/23/2015   Procedure: RIGHT TOTAL HIP ARTHROPLASTY ANTERIOR APPROACH;  Surgeon: Gaynelle Arabian, MD;  [facility];  Service: Orthopedics;  Laterality: Right;    FAMILY HISTORY: Family History  Problem Relation Age of Onset  . Diabetes Mother   . Hypertension Mother   . Colon cancer Father 71       living   . Hypertension Father   . Hypertension Sister   . Heart attack Brother   . Seizures Maternal Aunt     SOCIAL HISTORY: Social History   Socioeconomic History  . Marital status: Divorced    Spouse name: Not on file  . Number of children: 1  . Years of education: Not on file  . Highest education level: Not on file  Occupational History  . Occupation: disabled  Tobacco Use  . Smoking status: Current Every Day Smoker    Packs/day: 0.50    Years: 30.00    Pack years: 15.00    Types: Cigarettes  . Smokeless tobacco: Never Used  . Tobacco comment: trying to quit  Vaping Use  . Vaping Use: Never used  Substance and Sexual Activity  . Alcohol use: No    Alcohol/week: 0.0 standard drinks  . Drug use: No    Comment: hx cocaine abuse  . Sexual activity: Never    Birth control/protection: None  Other Topics Concern  . Not on file  Social History Narrative   Patient lives at home alone and she is single.    Disabled.   Education college education.   Right handed.   Caffeine mountain dew four daily.    Social Determinants of Health   Financial Resource Strain:   . Difficulty of Paying Living Expenses: Not on file  Food Insecurity:   . Worried About Charity fundraiser in the Last Year: Not on file  . Ran Out of Food in the Last Year: Not on file  Transportation Needs:   . Lack of  Transportation (Medical): Not on file  . Lack of Transportation (Non-Medical): Not on file  Physical Activity:   . Days of Exercise per Week: Not on file  . Minutes of Exercise per Session: Not on file  Stress:   . Feeling of Stress : Not on file  Social Connections:   . Frequency of Communication with Friends and Family: Not on file  . Frequency of Social Gatherings with Friends and Family: Not on file  . Attends Religious Services: Not on file  .  Active Member of Clubs or Organizations: Not on file  . Attends Archivist Meetings: Not on file  . Marital Status: Not on file  Intimate Partner Violence:   . Fear of Current or Ex-Partner: Not on file  . Emotionally Abused: Not on file  . Physically Abused: Not on file  . Sexually Abused: Not on file      PHYSICAL EXAM  There were no vitals filed for this visit. There is no height or weight on file to calculate BMI.  Generalized: Well developed, in no acute distress   Neurological examination  Mentation: Alert oriented to time, place, history taking. Follows all commands speech and language fluent Cranial nerve II-XII: Pupils were equal round reactive to light. Extraocular movements were full, visual field were full on confrontational test. Facial sensation and strength were normal. Uvula tongue midline. Head turning and shoulder shrug  were normal and symmetric. Motor: The motor testing reveals 5 over 5 strength of all 4 extremities. Good symmetric motor tone is noted throughout.  Sensory: Sensory testing is intact to soft touch on all 4 extremities. No evidence of extinction is noted.  Coordination: Cerebellar testing reveals good finger-nose-finger and heel-to-shin bilaterally.  Gait and station: Gait is normal. Tandem gait is normal. Romberg is negative. No drift is seen.  Reflexes: Deep tendon reflexes are symmetric and normal bilaterally.   DIAGNOSTIC DATA (LABS, IMAGING, TESTING) - I reviewed patient records,  labs, notes, testing and imaging myself where available.  Lab Results  Component Value Date   WBC 8.4 05/14/2020   HGB 12.6 05/14/2020   HCT 39.8 05/14/2020   MCV 84.9 05/14/2020   PLT 364 05/14/2020      Component Value Date/Time   NA 143 05/14/2020 1528   NA 142 09/07/2017 1459   NA 144 10/27/2014 0625   K 3.9 05/14/2020 1528   K 4.2 10/27/2014 0625   K 4.4 08/27/2011 1314   CL 107 05/14/2020 1528   CL 107 10/27/2014 0625   CL 102 08/27/2011 1314   CO2 20 05/14/2020 1528   CO2 29 10/27/2014 0625   CO2 25 08/27/2011 1314   GLUCOSE 93 05/14/2020 1528   GLUCOSE 94 10/27/2014 0625   BUN 5 (L) 05/14/2020 1528   BUN 7 09/07/2017 1459   BUN 10 10/27/2014 0625   CREATININE 1.05 05/14/2020 1528   CALCIUM 8.6 05/14/2020 1528   CALCIUM 8.9 10/27/2014 0625   CALCIUM 9.9 08/27/2011 1314   PROT 5.9 (L) 05/14/2020 1528   PROT 6.1 09/07/2017 1459   PROT 5.9 (L) 10/27/2014 0625   ALBUMIN 3.1 (L) 07/21/2019 1723   ALBUMIN 4.1 09/07/2017 1459   ALBUMIN 3.3 (L) 10/27/2014 0625   AST 13 05/14/2020 1528   AST 16 10/27/2014 0625   AST 11 08/27/2011 1314   ALT 10 05/14/2020 1528   ALT 14 10/27/2014 0625   ALKPHOS 128 (H) 07/21/2019 1723   ALKPHOS 119 (H) 10/27/2014 0625   ALKPHOS 91 08/27/2011 1314   BILITOT 0.2 05/14/2020 1528   BILITOT <0.2 09/07/2017 1459   BILITOT 0.4 10/27/2014 0625   BILITOT 0.5 08/27/2011 1314   GFRNONAA 58 (L) 05/14/2020 1528   GFRAA 68 05/14/2020 1528   Lab Results  Component Value Date   CHOL 281 (H) 05/14/2020   HDL 48 (L) 05/14/2020   LDLCALC 190 (H) 05/14/2020   TRIG 233 (H) 05/14/2020   CHOLHDL 5.9 (H) 05/14/2020   No results found for: HGBA1C Lab Results  Component Value Date  KHVFMBBU03 1,089 12/14/2018   Lab Results  Component Value Date   TSH 2.93 05/14/2020      ASSESSMENT AND PLAN 59 y.o. year old female  has a past medical history of Anxiety, Arthritis, Avascular necrosis of hip ([place]), Bipolar 1 disorder ([place]), Chronic left  hip pain, Chronic nausea, Depression, Diverticulosis of colon, Family hx of colon cancer, Folate deficiency, GERD (gastroesophageal reflux disease), Headache, Heart murmur, abnormal Pap smear, Hyperlipidemia, Hyperthyroidism, Keratosis, actinic, Low back pain (03/01/2013), Panic attacks, Polysubstance abuse ([place]), PTSD (post-traumatic stress disorder), S/P colonoscopy (05/30/2010), S/P endoscopy (05/30/2010), Seizures ([place]), Stroke ([place]) (2014), Tubular adenoma of colon (05/30/2010), and UTI (urinary tract infection). here with ***   I spent 15 minutes with the patient. 50% of this time was spent   Butler Denmark, [place], DNP 09/17/2020, 5:52 AM [facility] [address], [place], [place] [address] [phone]

## 2020-10-11 ENCOUNTER — Other Ambulatory Visit: Payer: Self-pay | Admitting: Family Medicine

## 2020-10-11 ENCOUNTER — Other Ambulatory Visit: Payer: Self-pay | Admitting: Adult Health

## 2020-10-11 NOTE — Telephone Encounter (Signed)
I called and spoke to pharmacist about this pt, last seen in our office 2019.  Looks like she has been getting this. I relayed will refill for 30 day and she needs appt. He will relay to her.

## 2020-10-24 ENCOUNTER — Telehealth: Payer: Self-pay

## 2020-10-24 ENCOUNTER — Other Ambulatory Visit: Payer: Self-pay

## 2020-10-24 ENCOUNTER — Other Ambulatory Visit: Payer: Self-pay | Admitting: Family Medicine

## 2020-10-24 DIAGNOSIS — E7800 Pure hypercholesterolemia, unspecified: Secondary | ICD-10-CM

## 2020-10-24 DIAGNOSIS — K219 Gastro-esophageal reflux disease without esophagitis: Secondary | ICD-10-CM

## 2020-10-24 MED ORDER — ATORVASTATIN CALCIUM 40 MG PO TABS: 40.0000 mg | ORAL_TABLET | Freq: Every day | ORAL | 3 refills | Status: DC

## 2020-10-24 NOTE — Telephone Encounter (Signed)
Patient called for lab results from back in June.

## 2020-10-24 NOTE — Telephone Encounter (Signed)
Patient finally called back for lab results from back in June, Crestor 40 mg sent in for patient !

## 2020-11-08 ENCOUNTER — Other Ambulatory Visit: Payer: Self-pay | Admitting: Adult Health

## 2020-11-13 ENCOUNTER — Other Ambulatory Visit: Payer: Self-pay | Admitting: Family Medicine

## 2020-11-13 NOTE — Telephone Encounter (Signed)
Ok to refill??  Last office visit 05/14/2020.  Last refill 10/11/2020.

## 2020-11-22 ENCOUNTER — Other Ambulatory Visit: Payer: Self-pay | Admitting: Family Medicine

## 2020-12-05 ENCOUNTER — Encounter: Payer: Self-pay | Admitting: Emergency Medicine

## 2020-12-05 ENCOUNTER — Ambulatory Visit
Admission: EM | Admit: 2020-12-05 | Discharge: 2020-12-05 | Disposition: A | Discharge status: Home / Self Care | Payer: Medicaid Other | Attending: Family Medicine | Admitting: Family Medicine

## 2020-12-05 ENCOUNTER — Other Ambulatory Visit: Payer: Self-pay

## 2020-12-05 DIAGNOSIS — B349 Viral infection, unspecified: Secondary | ICD-10-CM | POA: Diagnosis not present

## 2020-12-05 DIAGNOSIS — J3489 Other specified disorders of nose and nasal sinuses: Secondary | ICD-10-CM

## 2020-12-05 DIAGNOSIS — H04203 Unspecified epiphora, bilateral lacrimal glands: Secondary | ICD-10-CM

## 2020-12-05 DIAGNOSIS — R52 Pain, unspecified: Secondary | ICD-10-CM

## 2020-12-05 NOTE — ED Triage Notes (Signed)
Eyes started draining a few days ago and wont stop.  Also reports body aches, scratchy throat for a few days as well.

## 2020-12-05 NOTE — Discharge Instructions (Signed)
Your COVID test is pending.  You should self quarantine until the test result is back.    Take Tylenol or ibuprofen as needed for fever or discomfort.  Rest and keep yourself hydrated.    Follow-up with your primary care provider if your symptoms are not improving.     

## 2020-12-05 NOTE — ED Provider Notes (Signed)
Sheryl Hall   [id number] 12/05/20 Arrival Time: [id number]   CC: COVID symptoms  SUBJECTIVE: History from: patient.  Sheryl Hall is a 60 y.o. female who presents with nasal congestion, body aches, watery eyes, rhinorrhea x 2-3 days. Reports that she lives in a senior citizen community. States that several contacts are sick. Denies recent travel. Has negative history of Covid. Has completed Covid vaccines. Has not taken OTC medications for this. There are no aggravating or alleviating factors. Denies previous symptoms in the past. Denies fever, chills, fatigue, sinus pain, SOB, wheezing, chest pain, nausea, changes in bowel or bladder habits.    ROS: As per HPI.  All other pertinent ROS negative.     Past Medical History:  Diagnosis Date  . Anxiety   . Arthritis   . Avascular necrosis of hip ([place])    left  . Bipolar 1 disorder ([place])   . Chronic left hip pain   . Chronic nausea   . Depression   . Diverticulosis of colon   . Family hx of colon cancer    age 73-father  . Folate deficiency   . GERD (gastroesophageal reflux disease)   . Headache   . Heart murmur   . Hx of abnormal Pap smear   . Hyperlipidemia   . Hyperthyroidism   . Keratosis, actinic   . Low back pain 03/01/2013   MRI with multiple levels of disc bulge  . Panic attacks   . Polysubstance abuse ([place])   . PTSD (post-traumatic stress disorder)   . S/P colonoscopy 05/30/2010   LAX sphincter tone, anal papilla, left-sided diverticulosis, normal random biopsies,, 1 polyp-TA  . S/P endoscopy 05/30/2010   Dr Rourk-> non--critical Schatzki's ring, s/p 77F dilation  . Seizures ([place])   . Stroke [facility]) 2014   Right parietal, no deficits   . Tubular adenoma of colon 05/30/2010   Next colonoscopy 05/2015  . UTI (urinary tract infection)    Past Surgical History:  Procedure Laterality Date  . COLONOSCOPY     every 5 years  . COLONOSCOPY  05/30/2010   RMR:lax anal sphincter tone,anal papilla,otherwise  normal/left-sided diverticula  . ESOPHAGOGASTRODUODENOSCOPY  05/30/2010   [id number] ring/small HH otherwise normal  . EXTERNAL EAR SURGERY Left 12 years ago   skin graft from behind ear put in ear canal  . JOINT REPLACEMENT    . NECK SURGERY  10 years ago  . SKIN CANCER DESTRUCTION    . 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 TOTAL HIP ARTHROPLASTY Right 05/23/2015   Procedure: RIGHT TOTAL HIP ARTHROPLASTY ANTERIOR APPROACH;  Surgeon: Gaynelle Arabian, MD;  Location: [facility];  Service: Orthopedics;  Laterality: Right;   No Known Allergies No current facility-administered medications on file prior to encounter.   Current Outpatient Medications on File Prior to Encounter  Medication Sig Dispense Refill  . aspirin EC 81 MG tablet Take 81 mg by mouth daily. (Patient not taking: Reported on 05/14/2020)    . atorvastatin (LIPITOR) 40 MG tablet Take 1 tablet (40 mg total) by mouth daily. 90 tablet 3  . cholecalciferol (VITAMIN D3) 25 MCG (1000 UT) tablet Take 1,000 Units by mouth daily. (Patient not taking: Reported on 05/14/2020)    . escitalopram (LEXAPRO) 20 MG tablet TAKE (1) TABLET BY MOUTH ONCE DAILY. 30 tablet 0  . levETIRAcetam (KEPPRA) 1000 MG  tablet TAKE 1/2 TABLET IN THE MORNING AND 1&1/2 TABLETS IN THE EVENING. 60 tablet 0  . levothyroxine (SYNTHROID) 50 MCG tablet TAKE ONE TABLET BY MOUTH ONCE DAILY. 90 tablet 3  . LORazepam (ATIVAN) 1 MG tablet TAKE (1) TABLET BY MOUTH TWICE DAILY AS NEEDED FOR ANXIETY. 60 tablet 0  . OLANZapine (ZYPREXA) 5 MG tablet TAKE (1) TABLET BY MOUTH AT BEDTIME. 30 tablet 0  . omeprazole (PRILOSEC) 40 MG capsule TAKE (1) CAPSULE BY MOUTH TWICE DAILY. 60 capsule 0  . Oxcarbazepine (TRILEPTAL) 300 MG tablet TAKE (1) TABLET BY MOUTH TWICE DAILY. 60 tablet 0  . tamsulosin (FLOMAX) 0.4 MG CAPS capsule Take 1  capsule (0.4 mg total) by mouth daily. (Patient not taking: Reported on 05/14/2020) 15 capsule 0  . valACYclovir (VALTREX) 500 MG tablet TAKE ONE TABLET BY MOUTH ONCE DAILY. 30 tablet 5  . vitamin B-12 (CYANOCOBALAMIN) 1000 MCG tablet Take 1,000 mcg by mouth daily. (Patient not taking: Reported on 05/14/2020)    . [DISCONTINUED] LINZESS 72 MCG capsule TAKE ONE CAPSULE ORALLY EVERY MORNING BEFORE BREAKFAST. (Patient taking differently: Take 72 mcg by mouth daily before breakfast. ) 30 capsule 1  . [DISCONTINUED] traZODone (DESYREL) 100 MG tablet TAKE (1) TABLET BY MOUTH AT BEDTIME. 30 tablet 0   Social History   Socioeconomic History  . Marital status: Divorced    Spouse name: Not on file  . Number of children: 1  . Years of education: Not on file  . Highest education level: Not on file  Occupational History  . Occupation: disabled  Tobacco Use  . Smoking status: Current Every Day Smoker    Packs/day: 0.50    Years: 30.00    Pack years: 15.00    Types: Cigarettes  . Smokeless tobacco: Never Used  . Tobacco comment: trying to quit  Vaping Use  . Vaping Use: Never used  Substance and Sexual Activity  . Alcohol use: No    Alcohol/week: 0.0 standard drinks  . Drug use: No    Comment: hx cocaine abuse  . Sexual activity: Never    Birth control/protection: None  Other Topics Concern  . Not on file  Social History Narrative   Patient lives at home alone and she is single.    Disabled.   Education college education.   Right handed.   Caffeine mountain dew four daily.    Social Determinants of Health   Financial Resource Strain: Not on file  Food Insecurity: Not on file  Transportation Needs: Not on file  Physical Activity: Not on file  Stress: Not on file  Social Connections: Not on file  Intimate Partner Violence: Not on file   Family History  Problem Relation Age of Onset  . Diabetes Mother   . Hypertension Mother   . Colon cancer Father 66       living   .  Hypertension Father   . Hypertension Sister   . Heart attack Brother   . Seizures Maternal Aunt     OBJECTIVE:  Vitals:   12/05/20 1546 12/05/20 1548  BP:  129/81  Pulse:  82  Resp:  18  Temp:  98.5 F (36.9 C)  TempSrc:  Oral  SpO2:  97%  Weight: 175 lb (79.4 kg)   Height: 5\' 6"  (1.676 m)      General appearance: alert; appears fatigued, but nontoxic; speaking in full sentences and tolerating own secretions HEENT: NCAT; Ears: EACs clear, TMs pearly gray; Eyes: PERRL.  EOM grossly  intact. Sinuses: nontender; Nose: nares patent with clear rhinorrhea, Throat: oropharynx erythematous, cobblestoning present, tonsils non erythematous or enlarged, uvula midline  Neck: supple without LAD Lungs: unlabored respirations, symmetrical air entry; cough: absent; no respiratory distress; CTAB Heart: regular rate and rhythm.  Radial pulses 2+ symmetrical bilaterally Skin: warm and dry Psychological: alert and cooperative; normal mood and affect  LABS:  No results found for this or any previous visit (from the past 24 hour(s)).   ASSESSMENT & PLAN:  1. Viral illness   2. Body aches   3. Rhinorrhea   4. Watery eyes    Continue supportive care at home COVID and flu testing ordered.  It will take between 2-3 days for test results. Someone will contact you regarding abnormal results.   Patient should remain in quarantine until they have received Covid results.  If negative you may resume normal activities (go back to work/school) while practicing hand hygiene, social distance, and mask wearing.  If positive, patient should remain in quarantine for at least 5 days from symptom onset AND greater than 72 hours after symptoms resolution (absence of fever without the use of fever-reducing medication and improvement in respiratory symptoms), whichever is longer Get plenty of rest and push fluids Use OTC zyrtec for nasal congestion, runny nose, and/or sore throat Use OTC flonase for nasal congestion  and runny nose Use medications daily for symptom relief Use OTC medications like ibuprofen or tylenol as needed fever or pain Call or go to the ED if you have any new or worsening symptoms such as fever, worsening cough, shortness of breath, chest tightness, chest pain, turning blue, changes in mental status.  Reviewed expectations re: course of current medical issues. Questions answered. Outlined signs and symptoms indicating need for more acute intervention. Patient verbalized understanding. After Visit Summary given.         Faustino Congress, NP 12/05/20 [phone]

## 2020-12-07 LAB — COVID-19, FLU A+B NAA
Influenza A, NAA: NOT DETECTED
Influenza B, NAA: NOT DETECTED
SARS-CoV-2, NAA: NOT DETECTED

## 2020-12-12 ENCOUNTER — Other Ambulatory Visit: Payer: Self-pay | Admitting: Adult Health

## 2020-12-17 ENCOUNTER — Other Ambulatory Visit: Payer: Self-pay | Admitting: Family Medicine

## 2020-12-17 NOTE — Telephone Encounter (Signed)
Ok to refill??  Last office visit 05/14/2020.  Last refill 11/13/2020.

## 2020-12-24 ENCOUNTER — Other Ambulatory Visit: Payer: Self-pay | Admitting: Family Medicine

## 2020-12-24 DIAGNOSIS — K219 Gastro-esophageal reflux disease without esophagitis: Secondary | ICD-10-CM

## 2020-12-25 ENCOUNTER — Ambulatory Visit: Payer: Medicaid Other | Admitting: Neurology

## 2021-01-11 ENCOUNTER — Other Ambulatory Visit: Payer: Self-pay | Admitting: Adult Health

## 2021-01-11 NOTE — Telephone Encounter (Signed)
Attempted to call both numbers on file. The home number was not a working number, and the mobile phone number went straight to a voicemail that had not been set up. At this point we will have to refuse the medication refill because it has been since 2019 since the patient has been seen. I have an opening with Sheryl Hall on February 22,2022 at 3:15, this can be a video visit or an office visit. I have placed this appointment on hold so that if the patient calls back requesting the refill we can place them in that spot. Patient has to be seen ASAP or get the refills from her PCP if she is stable

## 2021-01-15 ENCOUNTER — Other Ambulatory Visit: Payer: Self-pay | Admitting: Adult Health

## 2021-01-16 ENCOUNTER — Other Ambulatory Visit: Payer: Self-pay | Admitting: Family Medicine

## 2021-01-16 NOTE — Telephone Encounter (Signed)
Ok to refill??  Last office visit 05/14/2020.  Last refill 12/17/2020.

## 2021-01-21 ENCOUNTER — Other Ambulatory Visit: Payer: Self-pay | Admitting: Family Medicine

## 2021-01-21 DIAGNOSIS — K219 Gastro-esophageal reflux disease without esophagitis: Secondary | ICD-10-CM

## 2021-02-06 ENCOUNTER — Telehealth (INDEPENDENT_AMBULATORY_CARE_PROVIDER_SITE_OTHER): Payer: Medicaid Other | Admitting: Family Medicine

## 2021-02-06 ENCOUNTER — Other Ambulatory Visit: Payer: Self-pay

## 2021-02-06 DIAGNOSIS — F319 Bipolar disorder, unspecified: Secondary | ICD-10-CM

## 2021-02-06 NOTE — Progress Notes (Signed)
Subjective:    Patient ID: Sheryl Hall, female    DOB: November 07, 1961, 60 y.o.   MRN: [id number]  HPI Patient is being seen today as a telephone visit.  She consents to be seen via telephone.  Phone call began at 1220.  Phone call concluded at 1230.  She is currently at home.  I am currently in my office.  She has a history of bipolar disorder.  She states that over the last 1-1/2 weeks, she started having racing thoughts.  This is happening during the day and at night.  She is not having any panic attacks or anxiety she is having a difficult time turning off her mind to use her terminology.  She denies any hallucinations.  She denies any suicidal thoughts or homicidal thoughts.  She denies any depression or anhedonia.  She denies any recent medication changes.  She denies changes or stress in her life that could have triggered this episode.  Today she certainly does not seem delusional.  Her speech is normal.  She is understandable and easy to follow regarding her history.  Therefore she does not appear to be manic. Past Medical History:  Diagnosis Date  . Anxiety   . Arthritis   . Avascular necrosis of hip ([place])    left  . Bipolar 1 disorder ([place])   . Chronic left hip pain   . Chronic nausea   . Depression   . Diverticulosis of colon   . Family hx of colon cancer    age 45-father  . Folate deficiency   . GERD (gastroesophageal reflux disease)   . Headache   . Heart murmur   . Hx of abnormal Pap smear   . Hyperlipidemia   . Hyperthyroidism   . Keratosis, actinic   . Low back pain 03/01/2013   MRI with multiple levels of disc bulge  . Panic attacks   . Polysubstance abuse ([place])   . PTSD (post-traumatic stress disorder)   . S/P colonoscopy 05/30/2010   LAX sphincter tone, anal papilla, left-sided diverticulosis, normal random biopsies,, 1 polyp-TA  . S/P endoscopy 05/30/2010   Dr Rourk-> non--critical Schatzki's ring, s/p 50F dilation  . Seizures ([place])   . Stroke [facility]) 2014   Right  parietal, no deficits   . Tubular adenoma of colon 05/30/2010   Next colonoscopy 05/2015  . UTI (urinary tract infection)    Past Surgical History:  Procedure Laterality Date  . COLONOSCOPY     every 5 years  . COLONOSCOPY  05/30/2010   RMR:lax anal sphincter tone,anal papilla,otherwise normal/left-sided diverticula  . ESOPHAGOGASTRODUODENOSCOPY  05/30/2010   [id number] ring/small HH otherwise normal  . EXTERNAL EAR SURGERY Left 12 years ago   skin graft from behind ear put in ear canal  . JOINT REPLACEMENT    . NECK SURGERY  10 years ago  . SKIN CANCER DESTRUCTION    . 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 TOTAL HIP ARTHROPLASTY Right 05/23/2015   Procedure: RIGHT TOTAL HIP ARTHROPLASTY ANTERIOR APPROACH;  Surgeon: Gaynelle Arabian, MD;  Location: [facility];  Service: Orthopedics;  Laterality: Right;   Current Outpatient Medications on File Prior to Visit  Medication Sig Dispense Refill  . aspirin EC 81 MG tablet Take 81 mg by mouth daily. (Patient not taking: Reported on 05/14/2020)    . atorvastatin (LIPITOR) 40  MG tablet Take 1 tablet (40 mg total) by mouth daily. 90 tablet 3  . cholecalciferol (VITAMIN D3) 25 MCG (1000 UT) tablet Take 1,000 Units by mouth daily. (Patient not taking: Reported on 05/14/2020)    . escitalopram (LEXAPRO) 20 MG tablet TAKE (1) TABLET BY MOUTH ONCE DAILY. 30 tablet 3  . levETIRAcetam (KEPPRA) 1000 MG tablet TAKE 1/2 TABLET IN THE MORNING AND 1&1/2 TABLETS IN THE EVENING. 60 tablet 0  . levothyroxine (SYNTHROID) 50 MCG tablet TAKE ONE TABLET BY MOUTH ONCE DAILY. 90 tablet 3  . LORazepam (ATIVAN) 1 MG tablet TAKE (1) TABLET BY MOUTH TWICE DAILY AS NEEDED FOR ANXIETY. 60 tablet 0  . OLANZapine (ZYPREXA) 5 MG tablet TAKE (1) TABLET BY MOUTH AT BEDTIME. 30 tablet 3  . omeprazole (PRILOSEC) 40 MG capsule TAKE  (1) CAPSULE BY MOUTH TWICE DAILY. 60 capsule 3  . Oxcarbazepine (TRILEPTAL) 300 MG tablet TAKE (1) TABLET BY MOUTH TWICE DAILY. 60 tablet 0  . valACYclovir (VALTREX) 500 MG tablet TAKE ONE TABLET BY MOUTH ONCE DAILY. 30 tablet 5  . vitamin B-12 (CYANOCOBALAMIN) 1000 MCG tablet Take 1,000 mcg by mouth daily. (Patient not taking: Reported on 05/14/2020)    . [DISCONTINUED] LINZESS 72 MCG capsule TAKE ONE CAPSULE ORALLY EVERY MORNING BEFORE BREAKFAST. (Patient taking differently: Take 72 mcg by mouth daily before breakfast. ) 30 capsule 1  . [DISCONTINUED] traZODone (DESYREL) 100 MG tablet TAKE (1) TABLET BY MOUTH AT BEDTIME. 30 tablet 0   No current facility-administered medications on file prior to visit.   No Known Allergies Social History   Socioeconomic History  . Marital status: Divorced    Spouse name: Not on file  . Number of children: 1  . Years of education: Not on file  . Highest education level: Not on file  Occupational History  . Occupation: disabled  Tobacco Use  . Smoking status: Current Every Day Smoker    Packs/day: 0.50    Years: 30.00    Pack years: 15.00    Types: Cigarettes  . Smokeless tobacco: Never Used  . Tobacco comment: trying to quit  Vaping Use  . Vaping Use: Never used  Substance and Sexual Activity  . Alcohol use: No    Alcohol/week: 0.0 standard drinks  . Drug use: No    Comment: hx cocaine abuse  . Sexual activity: Never    Birth control/protection: None  Other Topics Concern  . Not on file  Social History Narrative   Patient lives at home alone and she is single.    Disabled.   Education college education.   Right handed.   Caffeine mountain dew four daily.    Social Determinants of Health   Financial Resource Strain: Not on file  Food Insecurity: Not on file  Transportation Needs: Not on file  Physical Activity: Not on file  Stress: Not on file  Social Connections: Not on file  Intimate Partner Violence: Not on file       Review of Systems  All other systems reviewed and are negative.      Objective:   Physical Exam        Assessment & Plan:  Bipolar I disorder [facility])  Patient states that she has had manic episodes in the past.  She is afraid that she is starting to develop 1 and she wanted to be proactive.  Today her history and demeanor seems completely normal.  Her judgment and mood seems sound.  She denies any suicidal ideation.  We will increase her Zyprexa to 10 mg a day and then reassess next week or sooner if symptoms are worsening.  No other changes in her medications were made.

## 2021-02-11 ENCOUNTER — Other Ambulatory Visit: Payer: Self-pay | Admitting: Family Medicine

## 2021-02-11 ENCOUNTER — Other Ambulatory Visit: Payer: Self-pay | Admitting: Adult Health

## 2021-02-11 ENCOUNTER — Telehealth: Payer: Self-pay | Admitting: *Deleted

## 2021-02-11 MED ORDER — OLANZAPINE 10 MG PO TABS: 10.0000 mg | ORAL_TABLET | Freq: Every day | ORAL | 3 refills | Status: DC

## 2021-02-11 NOTE — Telephone Encounter (Signed)
Received call from patient.   Reports that she has been taking Zyprexa 10mg  and has noted decrease in racing thoughts and anxiety.   Requested new prescription for Zyprexa 10mg .   Ok to order?

## 2021-02-11 NOTE — Telephone Encounter (Signed)
ok 

## 2021-02-11 NOTE — Telephone Encounter (Signed)
Prescription sent to pharmacy.

## 2021-03-11 ENCOUNTER — Ambulatory Visit: Payer: Medicaid Other | Admitting: Neurology

## 2021-03-11 ENCOUNTER — Encounter: Payer: Self-pay | Admitting: Neurology

## 2021-03-11 VITALS — BP 127/84 | HR 91 | Ht 66.0 in | Wt 186.0 lb

## 2021-03-11 DIAGNOSIS — G40209 Localization-related (focal) (partial) symptomatic epilepsy and epileptic syndromes with complex partial seizures, not intractable, without status epilepticus: Secondary | ICD-10-CM

## 2021-03-11 MED ORDER — LEVETIRACETAM 1000 MG PO TABS: ORAL_TABLET | ORAL | 4 refills | Status: DC

## 2021-03-11 NOTE — Patient Instructions (Signed)
Continue Keppra at current dose Call for any seizure activity See you back in 1 year

## 2021-03-11 NOTE — Progress Notes (Signed)
PATIENT: Sheryl Hall DOB: November 01, 1961  REASON FOR VISIT: follow up HISTORY FROM: patient  HISTORY OF PRESENT ILLNESS: Today 03/11/21 Sheryl Hall is a 60 year old female with history of seizures.  She has not been seen since October 2019.  She remains on Keppra.  Denies any recent seizures.  She lives alone, drives a car.  Indicates no changes to her overall health.  She has no problems today.  Here today for evaluation unaccompanied.  HISTORY  09/14/2018 MM: Sheryl Hall is a 60 year old female with a history of seizures and memory disturbance.  She returns today for follow-up. She denies any seizure events.  She continues on Keppra.  She feels that her memory has remained stable.  She lives at home alone.  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  She manages her own finances.  She prepares most without difficulty.  She manages her own medication and appointments.  She returns today for evaluation.  REVIEW OF SYSTEMS: Out of a complete 14 system review of symptoms, the patient complains only of the following symptoms, and all other reviewed systems are negative.  n/a  ALLERGIES: No Known Allergies  HOME MEDICATIONS: Outpatient Medications Prior to Visit  Medication Sig Dispense Refill  . aspirin EC 81 MG tablet Take 81 mg by mouth daily.    Marland Kitchen atorvastatin (LIPITOR) 40 MG tablet Take 1 tablet (40 mg total) by mouth daily. 90 tablet 3  . cholecalciferol (VITAMIN D3) 25 MCG (1000 UT) tablet Take 1,000 Units by mouth daily.    Marland Kitchen escitalopram (LEXAPRO) 20 MG tablet TAKE (1) TABLET BY MOUTH ONCE DAILY. 30 tablet 3  . levothyroxine (SYNTHROID) 50 MCG tablet TAKE ONE TABLET BY MOUTH ONCE DAILY. 90 tablet 3  . LORazepam (ATIVAN) 1 MG tablet TAKE (1) TABLET BY MOUTH TWICE DAILY AS NEEDED FOR ANXIETY. 60 tablet 0  . OLANZapine (ZYPREXA) 10 MG tablet Take 1 tablet (10 mg total) by mouth at bedtime. 30 tablet 3  . omeprazole (PRILOSEC) 40 MG capsule TAKE  (1) CAPSULE BY MOUTH TWICE DAILY. 60 capsule 3  . Oxcarbazepine (TRILEPTAL) 300 MG tablet TAKE (1) TABLET BY MOUTH TWICE DAILY. 60 tablet 0  . valACYclovir (VALTREX) 500 MG tablet TAKE ONE TABLET BY MOUTH ONCE DAILY. 30 tablet 5  . vitamin B-12 (CYANOCOBALAMIN) 1000 MCG tablet Take 1,000 mcg by mouth daily.    Marland Kitchen levETIRAcetam (KEPPRA) 1000 MG tablet TAKE 1/2 TABLET IN THE MORNING AND 1&1/2 TABLETS IN THE EVENING. 60 tablet 0   No facility-administered medications prior to visit.    PAST MEDICAL HISTORY: Past Medical History:  Diagnosis Date  . Anxiety   . Arthritis   . Avascular necrosis of hip ([place])    left  . Bipolar 1 disorder ([place])   . Chronic left hip pain   . Chronic nausea   . Depression   . Diverticulosis of colon   . Family hx of colon cancer    age 50-father  . Folate deficiency   . GERD (gastroesophageal reflux disease)   . Headache   . Heart murmur   . Hx of abnormal Pap smear   . Hyperlipidemia   . Hyperthyroidism   . Keratosis, actinic   . Low back pain 03/01/2013   MRI with multiple levels of disc bulge  . Panic attacks   . Polysubstance abuse ([place])   . PTSD (post-traumatic stress disorder)   . S/P colonoscopy 05/30/2010   LAX sphincter  tone, anal papilla, left-sided diverticulosis, normal random biopsies,, 1 polyp-TA  . S/P endoscopy 05/30/2010   Dr Rourk-> non--critical Schatzki's ring, s/p 90F dilation  . Seizures ([place])   . Stroke [facility]) 2014   Right parietal, no deficits   . Tubular adenoma of colon 05/30/2010   Next colonoscopy 05/2015  . UTI (urinary tract infection)     PAST SURGICAL HISTORY: Past Surgical History:  Procedure Laterality Date  . COLONOSCOPY     every 5 years  . COLONOSCOPY  05/30/2010   RMR:lax anal sphincter tone,anal papilla,otherwise normal/left-sided diverticula  . ESOPHAGOGASTRODUODENOSCOPY  05/30/2010   [id number] ring/small HH otherwise normal  . EXTERNAL EAR SURGERY Left 12 years ago   skin graft from behind ear put  in ear canal  . JOINT REPLACEMENT    . NECK SURGERY  10 years ago  . SKIN CANCER DESTRUCTION    . 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 TOTAL HIP ARTHROPLASTY Right 05/23/2015   Procedure: RIGHT TOTAL HIP ARTHROPLASTY ANTERIOR APPROACH;  Surgeon: Gaynelle Arabian, MD;  Location: [facility];  Service: Orthopedics;  Laterality: Right;    FAMILY HISTORY: Family History  Problem Relation Age of Onset  . Diabetes Mother   . Hypertension Mother   . Colon cancer Father 87       living   . Hypertension Father   . Hypertension Sister   . Heart attack Brother   . Seizures Maternal Aunt     SOCIAL HISTORY: Social History   Socioeconomic History  . Marital status: Divorced    Spouse name: Not on file  . Number of children: 1  . Years of education: Not on file  . Highest education level: Not on file  Occupational History  . Occupation: disabled  Tobacco Use  . Smoking status: Current Every Day Smoker    Packs/day: 0.50    Years: 30.00    Pack years: 15.00    Types: Cigarettes  . Smokeless tobacco: Never Used  . Tobacco comment: trying to quit  Vaping Use  . Vaping Use: Never used  Substance and Sexual Activity  . Alcohol use: No    Alcohol/week: 0.0 standard drinks  . Drug use: No    Comment: hx cocaine abuse  . Sexual activity: Never    Birth control/protection: None  Other Topics Concern  . Not on file  Social History Narrative   Patient lives at home alone and she is single.    Disabled.   Education college education.   Right handed.   Caffeine mountain dew four daily.    Social Determinants of Health   Financial Resource Strain: Not on file  Food Insecurity: Not on file  Transportation Needs: Not on file  Physical Activity: Not on file  Stress: Not on file  Social Connections: Not on file  Intimate Partner  Violence: Not on file   PHYSICAL EXAM  Vitals:   03/11/21 1404  BP: 127/84  Pulse: 91  Weight: 186 lb (84.4 kg)  Height: 5\' 6"  (1.676 m)   Body mass index is 30.02 kg/m.  Generalized: Well developed, in no acute distress   Neurological examination  Mentation: Alert oriented to time, place, history taking. Follows all commands speech and language fluent Cranial nerve II-XII: Pupils were equal round reactive to light. Extraocular movements were full, visual field were full on  confrontational test. Facial sensation and strength were normal.  Head turning and shoulder shrug  were normal and symmetric. Motor: The motor testing reveals 5 over 5 strength of all 4 extremities. Good symmetric motor tone is noted throughout.  Sensory: Sensory testing is intact to soft touch on all 4 extremities. No evidence of extinction is noted.  Coordination: Cerebellar testing reveals good finger-nose-finger and heel-to-shin bilaterally.  Gait and station: Gait is normal. Tandem gait is unsteady. Reflexes: Deep tendon reflexes are symmetric and normal bilaterally.   DIAGNOSTIC DATA (LABS, IMAGING, TESTING) - I reviewed patient records, labs, notes, testing and imaging myself where available.  Lab Results  Component Value Date   WBC 8.4 05/14/2020   HGB 12.6 05/14/2020   HCT 39.8 05/14/2020   MCV 84.9 05/14/2020   PLT 364 05/14/2020      Component Value Date/Time   NA 143 05/14/2020 1528   NA 142 09/07/2017 1459   NA 144 10/27/2014 0625   K 3.9 05/14/2020 1528   K 4.2 10/27/2014 0625   K 4.4 08/27/2011 1314   CL 107 05/14/2020 1528   CL 107 10/27/2014 0625   CL 102 08/27/2011 1314   CO2 20 05/14/2020 1528   CO2 29 10/27/2014 0625   CO2 25 08/27/2011 1314   GLUCOSE 93 05/14/2020 1528   GLUCOSE 94 10/27/2014 0625   BUN 5 (L) 05/14/2020 1528   BUN 7 09/07/2017 1459   BUN 10 10/27/2014 0625   CREATININE 1.05 05/14/2020 1528   CALCIUM 8.6 05/14/2020 1528   CALCIUM 8.9 10/27/2014 0625    CALCIUM 9.9 08/27/2011 1314   PROT 5.9 (L) 05/14/2020 1528   PROT 6.1 09/07/2017 1459   PROT 5.9 (L) 10/27/2014 0625   ALBUMIN 3.1 (L) 07/21/2019 1723   ALBUMIN 4.1 09/07/2017 1459   ALBUMIN 3.3 (L) 10/27/2014 0625   AST 13 05/14/2020 1528   AST 16 10/27/2014 0625   AST 11 08/27/2011 1314   ALT 10 05/14/2020 1528   ALT 14 10/27/2014 0625   ALKPHOS 128 (H) 07/21/2019 1723   ALKPHOS 119 (H) 10/27/2014 0625   ALKPHOS 91 08/27/2011 1314   BILITOT 0.2 05/14/2020 1528   BILITOT <0.2 09/07/2017 1459   BILITOT 0.4 10/27/2014 0625   BILITOT 0.5 08/27/2011 1314   GFRNONAA 58 (L) 05/14/2020 1528   GFRAA 68 05/14/2020 1528   Lab Results  Component Value Date   CHOL 281 (H) 05/14/2020   HDL 48 (L) 05/14/2020   LDLCALC 190 (H) 05/14/2020   TRIG 233 (H) 05/14/2020   CHOLHDL 5.9 (H) 05/14/2020   No results found for: HGBA1C Lab Results  Component Value Date   VITAMINB12 1,089 12/14/2018   Lab Results  Component Value Date   TSH 2.93 05/14/2020   ASSESSMENT AND PLAN 60 y.o. year old female  has a past medical history of Anxiety, Arthritis, Avascular necrosis of hip ([place]), Bipolar 1 disorder ([place]), Chronic left hip pain, Chronic nausea, Depression, Diverticulosis of colon, Family hx of colon cancer, Folate deficiency, GERD (gastroesophageal reflux disease), Headache, Heart murmur, abnormal Pap smear, Hyperlipidemia, Hyperthyroidism, Keratosis, actinic, Low back pain (03/01/2013), Panic attacks, Polysubstance abuse ([place]), PTSD (post-traumatic stress disorder), S/P colonoscopy (05/30/2010), S/P endoscopy (05/30/2010), Seizures ([place]), Stroke ([place]) (2014), Tubular adenoma of colon (05/30/2010), and UTI (urinary tract infection). here with:  1.  Seizures -Has remained stable, doing quite well, no recurrent seizure in several years -Continue Keppra at current dosing -Call for seizure activity, otherwise follow-up 1 year or sooner if needed,  ok for 15 min VV  I spent 20 minutes of face-to-face and  non-face-to-face time with patient.  This included previsit chart review, lab review, study review, order entry, electronic health record documentation, patient education.  Butler Denmark, AGNP-C, DNP 03/11/2021, 2:34 PM [facility] [address] [place], [place] [address] [phone]

## 2021-03-11 NOTE — Progress Notes (Signed)
I have read the note, and I agree with the clinical assessment and plan.  Arieona Swaggerty K Concepcion [name]   

## 2021-03-14 ENCOUNTER — Other Ambulatory Visit: Payer: Self-pay | Admitting: Family Medicine

## 2021-03-21 ENCOUNTER — Other Ambulatory Visit: Payer: Self-pay | Admitting: Family Medicine

## 2021-04-08 IMAGING — DX LEFT ELBOW - COMPLETE 3+ VIEW
4 series · 4 of 4 positions shown · non-contrast
Comparison: None.

CLINICAL DATA: 57-year-old female with pain and swelling

EXAM:
LEFT ELBOW - COMPLETE 3+ VIEW

[elbow ap]
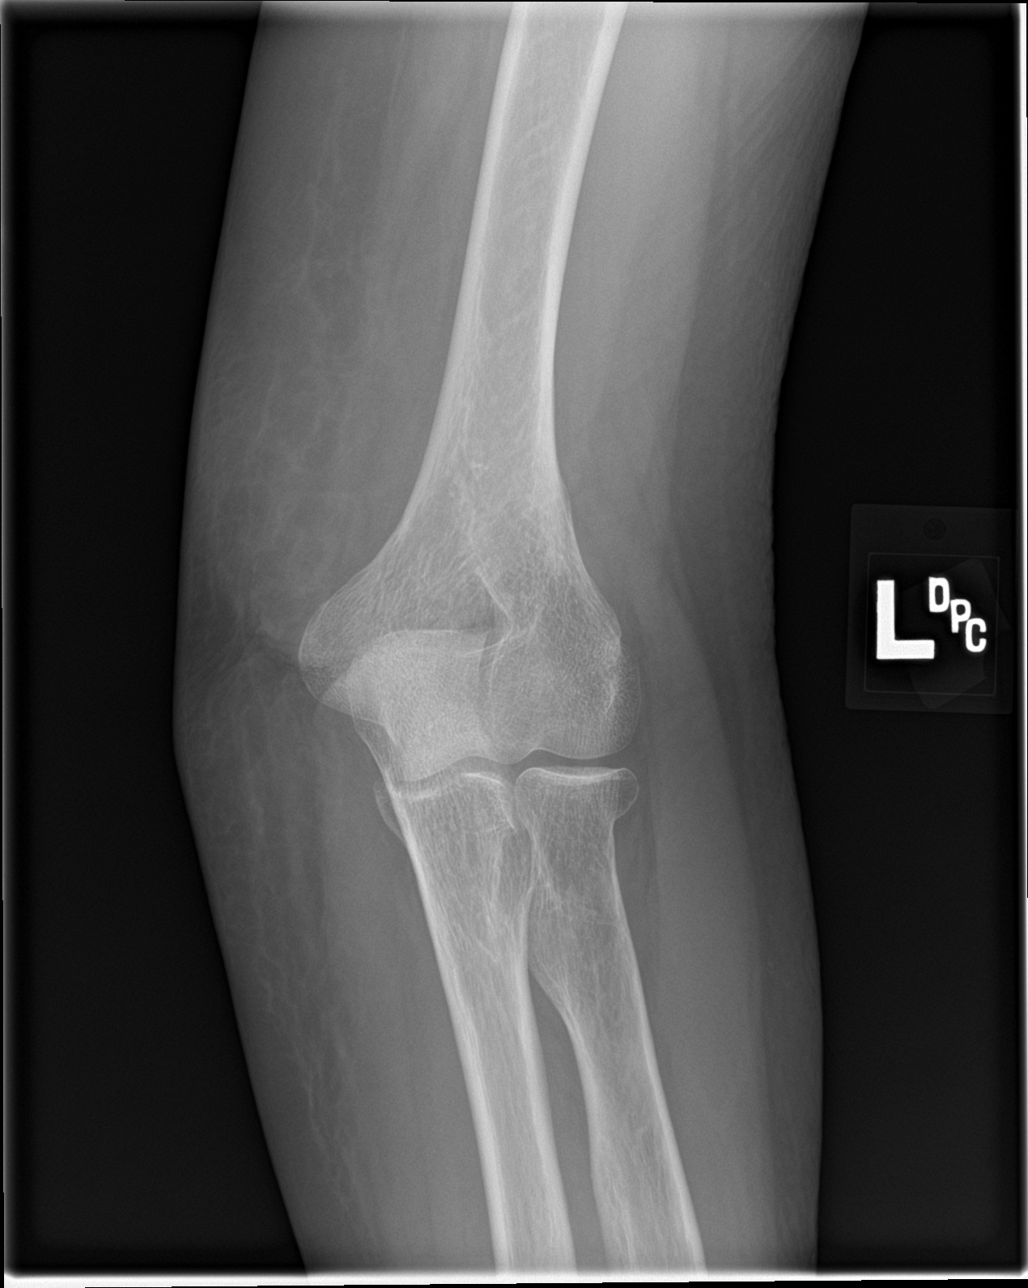

[elbow obl (1 of 2)]
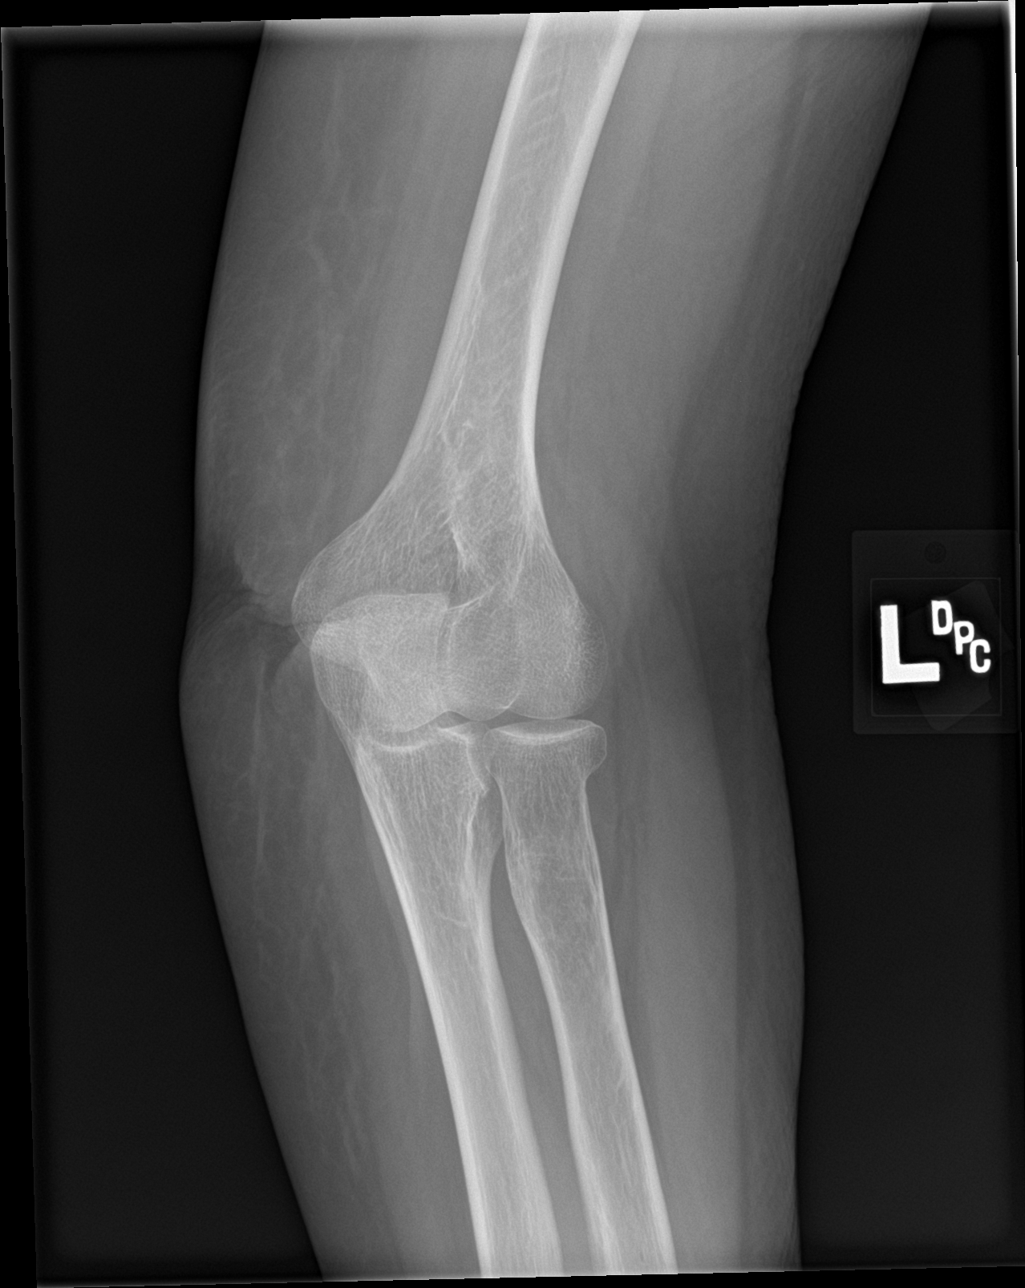

[elbow lat]
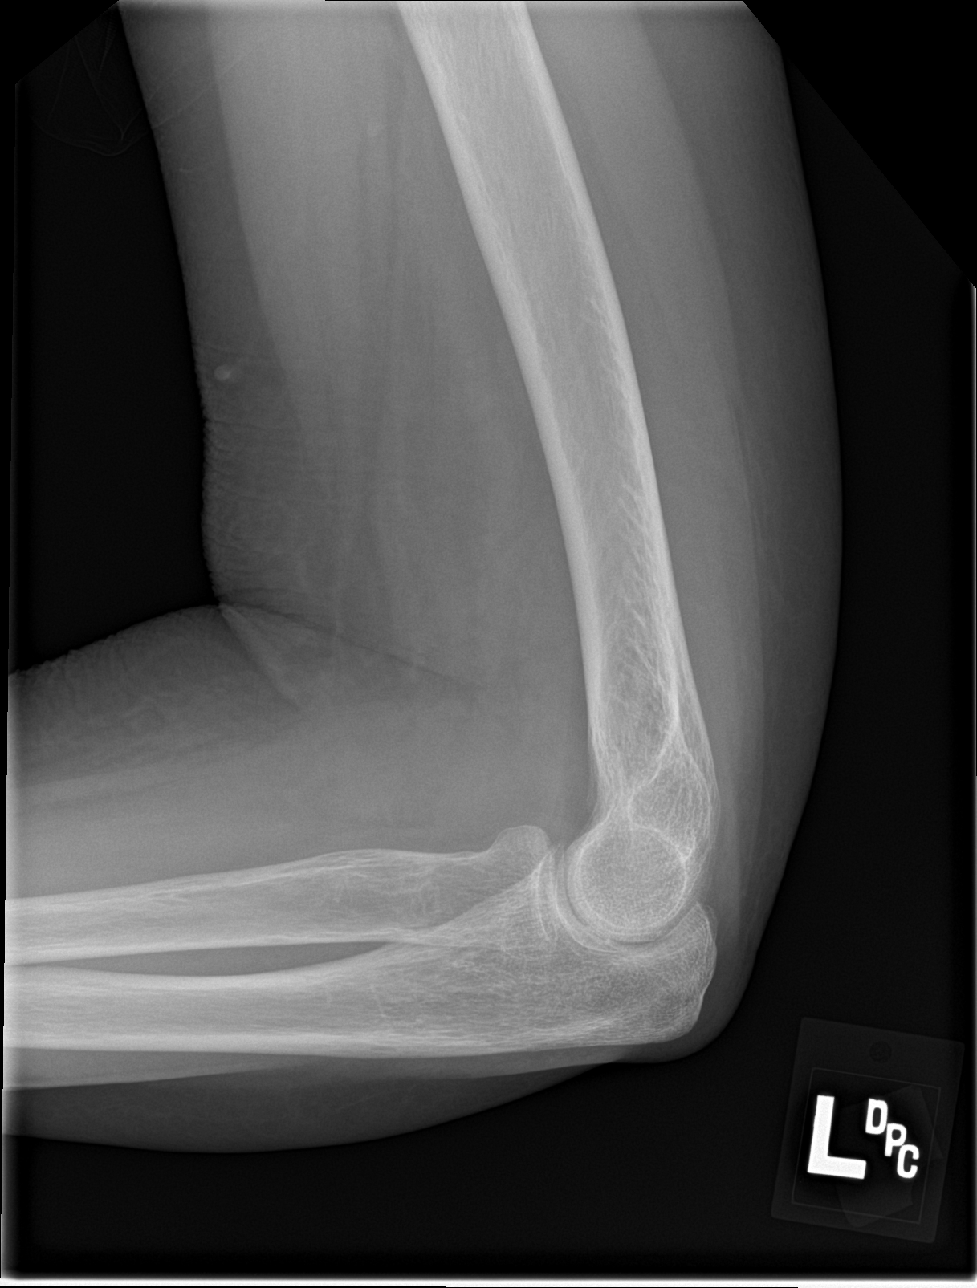

[elbow obl (2 of 2)]
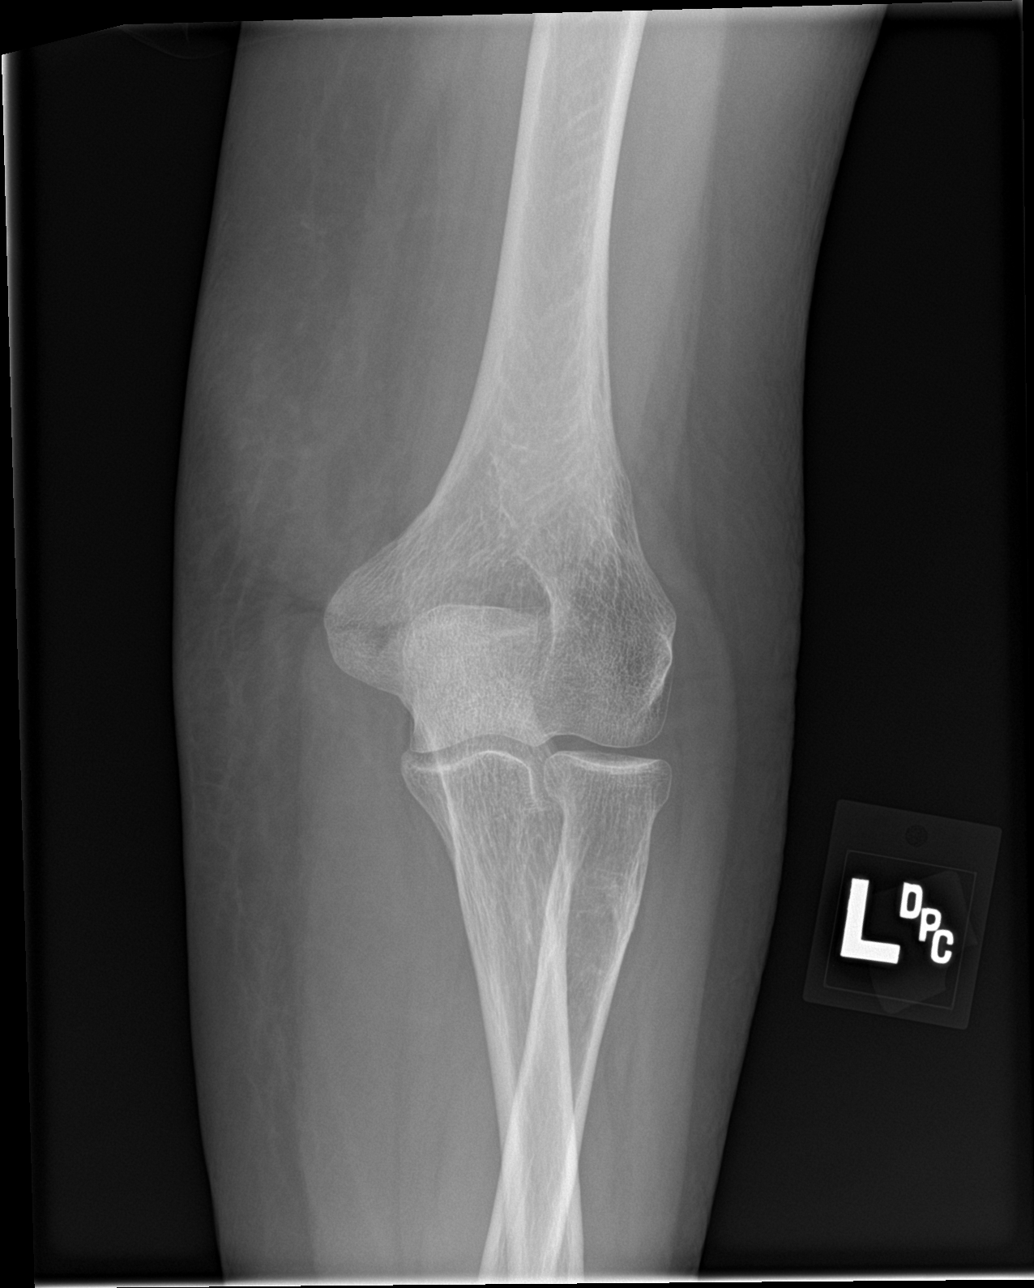

[4 of 4 positions shown; findings below may reference images not displayed]

FINDINGS: Osteopenia. No focal soft tissue swelling. No evidence of joint
effusion. No displaced fracture. No radiopaque foreign body.
IMPRESSION: Negative for acute bony abnormality

## 2021-04-16 ENCOUNTER — Other Ambulatory Visit: Payer: Self-pay | Admitting: Family Medicine

## 2021-04-23 ENCOUNTER — Other Ambulatory Visit: Payer: Self-pay | Admitting: Family Medicine

## 2021-04-23 NOTE — Telephone Encounter (Signed)
Ok to refill??  Last office visit 02/06/2021.  Last refill 03/22/2021.  Ok to add refills to prescription?

## 2021-05-14 ENCOUNTER — Other Ambulatory Visit: Payer: Self-pay | Admitting: Family Medicine

## 2021-05-22 ENCOUNTER — Other Ambulatory Visit: Payer: Self-pay | Admitting: Family Medicine

## 2021-05-22 DIAGNOSIS — K219 Gastro-esophageal reflux disease without esophagitis: Secondary | ICD-10-CM

## 2021-05-22 NOTE — Telephone Encounter (Signed)
Ok to refill??  Last office visit 02/06/2021.  Last refill 04/23/2021.

## 2021-05-23 NOTE — Telephone Encounter (Signed)
PDMP reviewed.  Appears patient takes this medication chronically.  Refill given in place of PCP who is out of the office. 

## 2021-06-13 ENCOUNTER — Other Ambulatory Visit: Payer: Self-pay | Admitting: Family Medicine

## 2021-06-20 ENCOUNTER — Other Ambulatory Visit: Payer: Self-pay | Admitting: Family Medicine

## 2021-06-20 NOTE — Telephone Encounter (Signed)
Ok to refill??  Last office visit 02/06/2021.  Last refill 05/23/2021.

## 2021-07-08 ENCOUNTER — Other Ambulatory Visit: Payer: Self-pay | Admitting: Family Medicine

## 2021-07-10 ENCOUNTER — Telehealth: Payer: Self-pay

## 2021-07-10 MED ORDER — LEVOTHYROXINE SODIUM 50 MCG PO TABS: 50.0000 ug | ORAL_TABLET | Freq: Every day | ORAL | 3 refills | Status: DC

## 2021-07-10 NOTE — Telephone Encounter (Signed)
Prescription sent to pharmacy.

## 2021-07-10 NOTE — Telephone Encounter (Signed)
Pt called in requesting a refill of levothyroxine (SYNTHROID) 50 MCG be sent to pharmacy. Pt is completely out of this med, and pt does have an appt scheduled for 8/25 with pcp to discuss meds. Please advise  Cb#: [phone]

## 2021-07-15 ENCOUNTER — Other Ambulatory Visit: Payer: Self-pay | Admitting: Family Medicine

## 2021-07-18 ENCOUNTER — Ambulatory Visit (INDEPENDENT_AMBULATORY_CARE_PROVIDER_SITE_OTHER): Payer: Medicaid Other | Admitting: Family Medicine

## 2021-07-18 ENCOUNTER — Encounter: Payer: Self-pay | Admitting: Family Medicine

## 2021-07-18 ENCOUNTER — Other Ambulatory Visit: Payer: Self-pay

## 2021-07-18 VITALS — BP 126/74 | HR 94 | Temp 98.9°F | Resp 14 | Ht 66.0 in | Wt 192.0 lb

## 2021-07-18 DIAGNOSIS — E038 Other specified hypothyroidism: Secondary | ICD-10-CM

## 2021-07-18 DIAGNOSIS — E7800 Pure hypercholesterolemia, unspecified: Secondary | ICD-10-CM

## 2021-07-18 DIAGNOSIS — F319 Bipolar disorder, unspecified: Secondary | ICD-10-CM | POA: Diagnosis not present

## 2021-07-18 MED ORDER — BREXPIPRAZOLE 1 MG PO TABS: 1.0000 mg | ORAL_TABLET | Freq: Every day | ORAL | 3 refills | Status: DC

## 2021-07-18 NOTE — Progress Notes (Signed)
Subjective:    Patient ID: Sheryl Hall, female    DOB: [birth year], 60 y.o.   MRN: [id number]  Patient is a very pleasant 60 year old Caucasian female who presents today to discuss her medications.  She has a history of bipolar disorder.  She is currently on Lexapro, Trileptal as a mood stabilizer, and Zyprexa as well.  She has been on this combination for quite some time.  She takes Keppra for seizure prevention.  She states that her depression has worsened over the last 6 to 8 weeks.  She reports wanting to sleep all the time.  She reports lack of energy.  She reports feeling sad.  She reports trouble concentrating.  She states that she is getting more easily distracted.  However at night she has trouble sleeping due to racing thoughts.  She states that she does not want to leave the house.  She does not want to be around a lot of people.  In the past she has been tried on [employer]SC[employer].  She had racing thoughts on Vraylar.  She has never been tried on Rexulti Past Medical History:  Diagnosis Date   Anxiety    Arthritis    Avascular necrosis of hip ([place])    left   Bipolar 1 disorder ([place])    Chronic left hip pain    Chronic nausea    Depression    Diverticulosis of colon    Family hx of colon cancer    age 8-father   Folate deficiency    GERD (gastroesophageal reflux disease)    Headache    Heart murmur    Hx of abnormal Pap smear    Hyperlipidemia    Hyperthyroidism    Keratosis, actinic    Low back pain 03/01/2013   MRI with multiple levels of disc bulge   Panic attacks    Polysubstance abuse (H[facility])    PTSD (post-traumatic stress disorder)    S/P colonoscopy 05/30/2010   LAX sphincter tone, anal papilla, left-sided diverticulosis, normal random biopsies,, 1 polyp-TA   S/P endoscopy 05/30/2010   Dr Rourk-> non--critical Schatzki's ring, s/p 27F dilation   Seizures ([place])    Stroke ([place]) 2014   Right parietal, no deficits    Tubular adenoma of colon 05/30/2010   Next colonoscopy 05/2015    UTI (urinary tract infection)    Past Surgical History:  Procedure Laterality Date   COLONOSCOPY     every 5 years   COLONOSCOPY  05/30/2010   RMR:lax anal sphincter tone,anal papilla,otherwise normal/left-sided diverticula   ESOPHAGOGASTRODUODENOSCOPY  05/30/2010   [id number] ring/small HH otherwise normal   EXTERNAL EAR SURGERY Left 12 years ago   skin graft from behind ear put in ear canal   JOINT REPLACEMENT     NECK SURGERY  10 years ago   SKIN CANCER DESTRUCTION     TOTAL HIP ARTHROPLASTY Left 04/19/2014   Procedure: LEFT TOTAL HIP ARTHROPLASTY ANTERIOR APPROACH;  Surgeon: [name], MD;  [facility];  Service: Orthopedics;  Laterality: Left;   TOTAL HIP ARTHROPLASTY Right 05/23/2015   Procedure: RIGHT TOTAL HIP ARTHROPLASTY ANTERIOR APPROACH;  Surgeon: Gaynelle Arabian, MD;  [facility];  Service: Orthopedics;  Laterality: Right;   Current Outpatient Medications on File Prior to Visit  Medication Sig Dispense Refill   atorvastatin (LIPITOR) 40 MG tablet Take 1 tablet (40 mg total) by mouth daily. 90 tablet 3   escitalopram (LEXAPRO) 20 MG tablet TAKE (1) TABLET BY MOUTH ONCE DAILY. [place]  tablet 0   levETIRAcetam (KEPPRA) 1000 MG tablet Take 1/2 tablet AM; take 1.5 tablets PM 180 tablet 4   levothyroxine (SYNTHROID) 50 MCG tablet Take 1 tablet (50 mcg total) by mouth daily. 90 tablet 3   LORazepam (ATIVAN) 1 MG tablet TAKE (1) TABLET BY MOUTH TWICE DAILY AS NEEDED FOR ANXIETY. 60 tablet 0   OLANZapine (ZYPREXA) 10 MG tablet TAKE (1) TABLET BY MOUTH AT BEDTIME. 30 tablet 0   omeprazole (PRILOSEC) 40 MG capsule TAKE (1) CAPSULE BY MOUTH TWICE DAILY. 60 capsule 3   Oxcarbazepine (TRILEPTAL) 300 MG tablet TAKE (1) TABLET BY MOUTH TWICE DAILY. 60 tablet 1   valACYclovir (VALTREX) 500 MG tablet TAKE ONE TABLET BY MOUTH ONCE DAILY. 30 tablet 1   [DISCONTINUED] LINZESS 72 MCG capsule TAKE ONE CAPSULE ORALLY EVERY MORNING BEFORE BREAKFAST. (Patient taking differently:  Take 72 mcg by mouth daily before breakfast. ) 30 capsule 1   [DISCONTINUED] traZODone (DESYREL) 100 MG tablet TAKE (1) TABLET BY MOUTH AT BEDTIME. 30 tablet 0   No current facility-administered medications on file prior to visit.   No Known Allergies Social History   Socioeconomic History   Marital status: Divorced    Spouse name: Not on file   Number of children: 1   Years of education: Not on file   Highest education level: Not on file  Occupational History   Occupation: disabled  Tobacco Use   Smoking status: Every Day    Packs/day: 0.50    Years: 30.00    Pack years: 15.00    Types: Cigarettes   Smokeless tobacco: Never   Tobacco comments:    trying to quit  Vaping Use   Vaping Use: Never used  Substance and Sexual Activity   Alcohol use: No    Alcohol/week: 0.0 standard drinks   Drug use: No    Comment: hx cocaine abuse   Sexual activity: Never    Birth control/protection: None  Other Topics Concern   Not on file  Social History Narrative   Patient lives at home alone and she is single.    Disabled.   Education college education.   Right handed.   Caffeine mountain dew four daily.    Social Determinants of Health   Financial Resource Strain: Not on file  Food Insecurity: Not on file  Transportation Needs: Not on file  Physical Activity: Not on file  Stress: Not on file  Social Connections: Not on file  Intimate Partner Violence: Not on file     Review of Systems  All other systems reviewed and are negative.     Objective:   Physical Exam Vitals reviewed.  Constitutional:      General: She is not in acute distress.    Appearance: She is well-developed. She is not diaphoretic.  HENT:     Right Ear: Tympanic membrane, ear canal and external ear normal.     Left Ear: Tympanic membrane, ear canal and external ear normal.     Nose: Nose normal.     Mouth/Throat:     Pharynx: No oropharyngeal exudate.  Eyes:     Extraocular Movements: Extraocular  movements intact.     Pupils: Pupils are equal, round, and reactive to light.  Neck:     Thyroid: No thyromegaly.     Vascular: No JVD.  Cardiovascular:     Rate and Rhythm: Normal rate and regular rhythm.     Heart sounds: Normal heart sounds. No murmur heard. Pulmonary:  Effort: Pulmonary effort is normal. No respiratory distress.     Breath sounds: Normal breath sounds. No stridor. No wheezing, rhonchi or rales.  Abdominal:     General: Bowel sounds are normal. There is no distension.     Palpations: Abdomen is soft.     Tenderness: There is no abdominal tenderness. There is no guarding or rebound.  Genitourinary:    Labia:        Right: No rash.        Left: No rash.      Vagina: Normal.     Cervix: Normal.     Uterus: Normal.      Adnexa: Right adnexa normal and left adnexa normal.  Musculoskeletal:     Cervical back: Neck supple.     Right lower leg: No edema.     Left lower leg: No edema.  Lymphadenopathy:     Cervical: No cervical adenopathy.  Skin:    Coloration: Skin is not pale.     Findings: No erythema or rash.  Neurological:     Mental Status: She is alert and oriented to person, place, and time.     Cranial Nerves: No cranial nerve deficit.     Motor: No abnormal muscle tone.     Coordination: Coordination normal.  Psychiatric:        Behavior: Behavior normal.        Thought Content: Thought content normal.        Judgment: Judgment normal.          Assessment & Plan:  Other specified hypothyroidism - Plan: CBC with Differential/Platelet, COMPLETE METABOLIC PANEL WITH GFR, Lipid panel, TSH  Pure hypercholesterolemia - Plan: CBC with Differential/Platelet, COMPLETE METABOLIC PANEL WITH GFR, Lipid panel, TSH  Bipolar I disorder (HCC) Patient is due for a TSH to recheck her hypothyroidism to ensure adequate dosage of levothyroxine.  She is also on atorvastatin for hyperlipidemia and is due to recheck a CMP and a fasting lipid panel.  Regarding her  bipolar disorder I will continue Lexapro at his current dose along with the Trileptal.  However I recommended decreasing Zyprexa and discontinuing it altogether.  Instead I like to replace it with Rexulti 1 mg daily.  Recheck via video conference in 2 weeks and at that point may consider increasing to 2 mg given her previous dose of the Zyprexa.  Reassess in 2 weeks or sooner if worsening

## 2021-07-19 LAB — CBC WITH DIFFERENTIAL/PLATELET
Absolute Monocytes: 530 cells/uL (ref 200–950)
Basophils Absolute: 37 cells/uL (ref 0–200)
Basophils Relative: 0.4 %
Eosinophils Absolute: 47 cells/uL (ref 15–500)
Eosinophils Relative: 0.5 %
HCT: 39.8 % (ref 35.0–45.0)
Hemoglobin: 12.3 g/dL (ref 11.7–15.5)
Lymphs Abs: 1702 cells/uL (ref 850–3900)
MCH: 25.2 pg — ABNORMAL LOW (ref 27.0–33.0)
MCHC: 30.9 g/dL — ABNORMAL LOW (ref 32.0–36.0)
MCV: 81.4 fL (ref 80.0–100.0)
MPV: 11.7 fL (ref 7.5–12.5)
Monocytes Relative: 5.7 %
Neutro Abs: 6984 cells/uL (ref 1500–7800)
Neutrophils Relative %: 75.1 %
Platelets: 264 10*3/uL (ref 140–400)
RBC: 4.89 10*6/uL (ref 3.80–5.10)
RDW: 14.6 % (ref 11.0–15.0)
Total Lymphocyte: 18.3 %
WBC: 9.3 10*3/uL (ref 3.8–10.8)

## 2021-07-19 LAB — COMPLETE METABOLIC PANEL WITH GFR
AG Ratio: 1.8 (calc) (ref 1.0–2.5)
ALT: 10 U/L (ref 6–29)
AST: 14 U/L (ref 10–35)
Albumin: 3.8 g/dL (ref 3.6–5.1)
Alkaline phosphatase (APISO): 167 U/L — ABNORMAL HIGH (ref 37–153)
BUN/Creatinine Ratio: 5 (calc) — ABNORMAL LOW (ref 6–22)
BUN: 5 mg/dL — ABNORMAL LOW (ref 7–25)
CO2: 27 mmol/L (ref 20–32)
Calcium: 8.7 mg/dL (ref 8.6–10.4)
Chloride: 106 mmol/L (ref 98–110)
Creat: 1.06 mg/dL — ABNORMAL HIGH (ref 0.50–1.03)
Globulin: 2.1 g/dL (calc) (ref 1.9–3.7)
Glucose, Bld: 87 mg/dL (ref 65–99)
Potassium: 4.3 mmol/L (ref 3.5–5.3)
Sodium: 141 mmol/L (ref 135–146)
Total Bilirubin: 0.3 mg/dL (ref 0.2–1.2)
Total Protein: 5.9 g/dL — ABNORMAL LOW (ref 6.1–8.1)
eGFR: 61 mL/min/{1.73_m2} (ref >=60)

## 2021-07-19 LAB — LIPID PANEL
Cholesterol: 144 mg/dL (ref <200)
HDL: 45 mg/dL — ABNORMAL LOW (ref >=50)
LDL Cholesterol (Calc): 78 mg/dL (calc)
Non-HDL Cholesterol (Calc): 99 mg/dL (calc) (ref <130)
Total CHOL/HDL Ratio: 3.2 (calc) (ref <5.0)
Triglycerides: 133 mg/dL (ref <150)

## 2021-07-19 LAB — TSH: TSH: 2.63 mIU/L (ref 0.40–4.50)

## 2021-07-22 ENCOUNTER — Other Ambulatory Visit: Payer: Self-pay | Admitting: Family Medicine

## 2021-07-22 NOTE — Telephone Encounter (Signed)
Ok to refill??  Last office visit 07/18/2021.  Last refill 06/21/2021.

## 2021-07-25 ENCOUNTER — Encounter: Payer: Self-pay | Admitting: *Deleted

## 2021-07-26 ENCOUNTER — Telehealth: Payer: Self-pay | Admitting: *Deleted

## 2021-07-26 NOTE — Telephone Encounter (Signed)
Received call from patient.   Reports that she was started on Rexulti at [place] 07/18/2021. States that she noted increased depression and SI with medication.   States that she has stopped Rexulti and resumed Zyprexa. Reports that she feels much better on Zyprexa at this time.   Please advise.

## 2021-08-01 ENCOUNTER — Telehealth: Payer: Medicaid Other | Admitting: Family Medicine

## 2021-08-06 ENCOUNTER — Telehealth: Payer: Self-pay | Admitting: Family Medicine

## 2021-08-06 NOTE — Telephone Encounter (Signed)
Multiple calls placed to patient with no answer and no return call.   Message to be closed.  

## 2021-08-06 NOTE — Telephone Encounter (Signed)
Patient called to request results of recent labs taken on 8/25. Please advise at [phone].

## 2021-08-15 ENCOUNTER — Other Ambulatory Visit: Payer: Self-pay | Admitting: Family Medicine

## 2021-08-20 ENCOUNTER — Other Ambulatory Visit: Payer: Self-pay | Admitting: Family Medicine

## 2021-08-20 NOTE — Telephone Encounter (Signed)
Ok to refill??  Last office visit 07/18/2021.  Last refill 07/22/2021.

## 2021-09-04 ENCOUNTER — Other Ambulatory Visit: Payer: Self-pay | Admitting: Family Medicine

## 2021-09-04 DIAGNOSIS — E7800 Pure hypercholesterolemia, unspecified: Secondary | ICD-10-CM

## 2021-09-11 ENCOUNTER — Other Ambulatory Visit: Payer: Self-pay | Admitting: Family Medicine

## 2021-09-18 ENCOUNTER — Other Ambulatory Visit: Payer: Self-pay | Admitting: Family Medicine

## 2021-09-18 NOTE — Telephone Encounter (Signed)
Ok to refill??  Last office visit 07/18/2021.  Last refill 08/20/2021.

## 2021-09-19 ENCOUNTER — Other Ambulatory Visit: Payer: Self-pay | Admitting: Family Medicine

## 2021-09-19 DIAGNOSIS — K219 Gastro-esophageal reflux disease without esophagitis: Secondary | ICD-10-CM

## 2021-10-14 ENCOUNTER — Other Ambulatory Visit: Payer: Self-pay | Admitting: Family Medicine

## 2021-10-21 ENCOUNTER — Other Ambulatory Visit: Payer: Self-pay | Admitting: Family Medicine

## 2021-10-21 DIAGNOSIS — K219 Gastro-esophageal reflux disease without esophagitis: Secondary | ICD-10-CM

## 2021-10-22 ENCOUNTER — Other Ambulatory Visit: Payer: Self-pay | Admitting: *Deleted

## 2021-10-23 ENCOUNTER — Other Ambulatory Visit: Payer: Self-pay | Admitting: Family Medicine

## 2021-10-23 NOTE — Telephone Encounter (Signed)
Rx sent yesterday went to "Print". Please resend. Pt is out of medication. Thanks!

## 2021-11-05 ENCOUNTER — Other Ambulatory Visit: Payer: Self-pay | Admitting: Family Medicine

## 2021-11-19 ENCOUNTER — Other Ambulatory Visit: Payer: Self-pay | Admitting: Family Medicine

## 2021-11-19 NOTE — Telephone Encounter (Signed)
Patient is requesting a refill on Loraxepam. Patient has not been seen since 07/18/2021 with a 2 weeks follow up.

## 2021-11-20 ENCOUNTER — Other Ambulatory Visit: Payer: Self-pay | Admitting: Family Medicine

## 2021-11-20 DIAGNOSIS — K219 Gastro-esophageal reflux disease without esophagitis: Secondary | ICD-10-CM

## 2021-12-05 ENCOUNTER — Emergency Department (HOSPITAL_COMMUNITY)
Admission: EM | Admit: 2021-12-05 | Discharge: 2021-12-05 | Disposition: A | Discharge status: Home / Self Care | Payer: Medicaid Other | Attending: Emergency Medicine | Admitting: Emergency Medicine

## 2021-12-05 ENCOUNTER — Other Ambulatory Visit: Payer: Self-pay

## 2021-12-05 ENCOUNTER — Emergency Department (HOSPITAL_COMMUNITY): Payer: Medicaid Other

## 2021-12-05 ENCOUNTER — Telehealth: Payer: Self-pay | Admitting: Orthopedic Surgery

## 2021-12-05 DIAGNOSIS — Z79899 Other long term (current) drug therapy: Secondary | ICD-10-CM | POA: Insufficient documentation

## 2021-12-05 DIAGNOSIS — W010XXA Fall on same level from slipping, tripping and stumbling without subsequent striking against object, initial encounter: Secondary | ICD-10-CM | POA: Insufficient documentation

## 2021-12-05 DIAGNOSIS — S42201A Unspecified fracture of upper end of right humerus, initial encounter for closed fracture: Secondary | ICD-10-CM | POA: Diagnosis not present

## 2021-12-05 DIAGNOSIS — S4991XA Unspecified injury of right shoulder and upper arm, initial encounter: Secondary | ICD-10-CM | POA: Diagnosis present

## 2021-12-05 MED ORDER — HYDROCODONE-ACETAMINOPHEN 5-325 MG PO TABS
1.0000 | ORAL_TABLET | Freq: Once | ORAL | Status: AC
  Administered 2021-12-05: 1 via ORAL
  Filled 2021-12-05: qty 1

## 2021-12-05 MED ORDER — HYDROCODONE-ACETAMINOPHEN 5-325 MG PO TABS: 1.0000 | ORAL_TABLET | ORAL | 0 refills | Status: DC | PRN

## 2021-12-05 MED ORDER — IBUPROFEN 800 MG PO TABS
800.0000 mg | ORAL_TABLET | Freq: Once | ORAL | Status: AC
  Administered 2021-12-05: 800 mg via ORAL
  Filled 2021-12-05: qty 1

## 2021-12-05 MED ORDER — OXYCODONE HCL 5 MG PO TABS
5.0000 mg | ORAL_TABLET | Freq: Once | ORAL | Status: AC
  Administered 2021-12-05: 5 mg via ORAL
  Filled 2021-12-05: qty 1

## 2021-12-05 MED ORDER — IBUPROFEN 600 MG PO TABS: 600.0000 mg | ORAL_TABLET | Freq: Four times a day (QID) | ORAL | 0 refills | Status: DC | PRN

## 2021-12-05 NOTE — Telephone Encounter (Signed)
See her Monday for xrays of the shoulder

## 2021-12-05 NOTE — Telephone Encounter (Signed)
Patient called regarding her emergency room visit for problem: Closed fracture of proximal end of right humerus, unspecified fracture morphology, initial encounter    - based on current schedule and Dr Aline Brochure out on Monday, please review and advise.

## 2021-12-05 NOTE — ED Provider Notes (Signed)
[facility] Provider Note   CSN: [id number] Arrival date & time: 12/05/21  0630     History  Chief Complaint  Patient presents with   Fall   Arm Injury    Sheryl Hall is a 61 y.o. female.  Pt is a 61 yo wf with a hx of PTSD, bipolar 1, GERD, depression.  She presents to the ED today with right upper arm pain.  Pt said she got up to use the bathroom around 0430 and tripped on a toy.  She did hit her head, but has no loc.  She managed to hit her right shoulder and her left hip.  She was able to ambulate.  She is not on blood thinners.        Home Medications Prior to Admission medications   Medication Sig Start Date End Date Taking? Authorizing Provider  HYDROcodone-acetaminophen (NORCO/VICODIN) 5-325 MG tablet Take 1 tablet by mouth every 4 (four) hours as needed. 12/05/21  Yes Isla Pence, MD  ibuprofen (ADVIL) 600 MG tablet Take 1 tablet (600 mg total) by mouth every 6 (six) hours as needed. 12/05/21  Yes Isla Pence, MD  atorvastatin (LIPITOR) 40 MG tablet TAKE (1) TABLET BY MOUTH ONCE DAILY. 09/05/21   Susy Frizzle, MD  brexpiprazole (REXULTI) 1 MG TABS tablet Take 1 tablet (1 mg total) by mouth daily. 07/18/21   Susy Frizzle, MD  escitalopram (LEXAPRO) 20 MG tablet TAKE (1) TABLET BY MOUTH ONCE DAILY. 09/18/21   Susy Frizzle, MD  levETIRAcetam (KEPPRA) 1000 MG tablet Take 1/2 tablet AM; take 1.5 tablets PM 03/11/21   Suzzanne Cloud, NP  levothyroxine (SYNTHROID) 50 MCG tablet Take 1 tablet (50 mcg total) by mouth daily. 07/10/21   Susy Frizzle, MD  LORazepam (ATIVAN) 1 MG tablet TAKE (1) TABLET BY MOUTH TWICE DAILY AS NEEDED FOR ANXIETY. 11/19/21   Susy Frizzle, MD  OLANZapine (ZYPREXA) 10 MG tablet TAKE (1) TABLET BY MOUTH AT BEDTIME. 10/15/21   Susy Frizzle, MD  omeprazole (PRILOSEC) 40 MG capsule TAKE (1) CAPSULE BY MOUTH TWICE DAILY. 11/20/21   Susy Frizzle, MD  Oxcarbazepine (TRILEPTAL) 300 MG tablet TAKE (1)  TABLET BY MOUTH TWICE DAILY. 11/06/21   Susy Frizzle, MD  valACYclovir (VALTREX) 500 MG tablet TAKE ONE TABLET BY MOUTH ONCE DAILY. 09/05/21   Susy Frizzle, MD  LINZESS 72 MCG capsule TAKE ONE CAPSULE ORALLY EVERY MORNING BEFORE BREAKFAST. Patient taking differently: Take 72 mcg by mouth daily before breakfast.  06/08/19 02/29/20  Carlis Stable, NP  traZODone (DESYREL) 100 MG tablet TAKE (1) TABLET BY MOUTH AT BEDTIME. 08/11/19 02/29/20  Susy Frizzle, MD      Allergies    Patient has no known allergies.    Review of Systems   Review of Systems  Musculoskeletal:        Right arm pain; left hip pain  All other systems reviewed and are negative.  Physical Exam Updated Vital Signs BP (!) 135/93 (BP Location: Left Arm)    Pulse 77    Temp 98.8 F (37.1 C) (Oral)    Resp 16    Ht 5\' 6"  (1.676 m)    Wt 86.2 kg    SpO2 100%    BMI 30.67 kg/m  Physical Exam Vitals and nursing note reviewed.  Constitutional:      Appearance: Normal appearance.  HENT:     Head: Normocephalic and atraumatic.  Right Ear: External ear normal.     Left Ear: External ear normal.     Nose: Nose normal.     Mouth/Throat:     Mouth: Mucous membranes are moist.     Pharynx: Oropharynx is clear.  Eyes:     Extraocular Movements: Extraocular movements intact.     Conjunctiva/sclera: Conjunctivae normal.     Pupils: Pupils are equal, round, and reactive to light.  Cardiovascular:     Rate and Rhythm: Normal rate and regular rhythm.     Pulses: Normal pulses.     Heart sounds: Normal heart sounds.  Pulmonary:     Effort: Pulmonary effort is normal.     Breath sounds: Normal breath sounds.  Abdominal:     General: Abdomen is flat. Bowel sounds are normal.     Palpations: Abdomen is soft.  Musculoskeletal:       Arms:     Cervical back: Normal range of motion and neck supple.  Skin:    General: Skin is warm.     Capillary Refill: Capillary refill takes less than 2 seconds.  Neurological:      General: No focal deficit present.     Mental Status: She is alert and oriented to person, place, and time.  Psychiatric:        Mood and Affect: Mood normal.        Behavior: Behavior normal.    ED Results / Procedures / Treatments   Labs (all labs ordered are listed, but only abnormal results are displayed) Labs Reviewed - No data to display  EKG None  Radiology DG Shoulder Right  Result Date: 12/05/2021 CLINICAL DATA:  Right shoulder pain after fall today. EXAM: RIGHT SHOULDER - 2+ VIEW COMPARISON:  None. FINDINGS: Cortical irregularity is seen involving the proximal right humeral head and neck concerning for minimally displaced proximal right humeral head or neck fracture. CT scan is recommended for further evaluation. IMPRESSION: Probable minimally displaced proximal right humeral head or neck fracture. CT scan is recommended for further evaluation. Electronically Signed   By: Marijo Conception M.D.   On: 12/05/2021 08:25   DG Humerus Right  Result Date: 12/05/2021 CLINICAL DATA:  Right upper arm pain after fall today. EXAM: RIGHT HUMERUS - 2+ VIEW COMPARISON:  None. FINDINGS: There is probable minimally displaced proximal right lateral humeral head or neck fracture. No dislocation is noted. IMPRESSION: Probable minimally displaced proximal right lateral humeral head or neck fracture. CT scan may be performed for further evaluation. Electronically Signed   By: Marijo Conception M.D.   On: 12/05/2021 08:22   DG Hip Unilat W or Wo Pelvis 2-3 Views Left  Result Date: 12/05/2021 CLINICAL DATA:  Pain after fall. EXAM: DG HIP (WITH OR WITHOUT PELVIS) 2-3V LEFT COMPARISON:  Apr 11, 2014. FINDINGS: Status post bilateral total hip arthroplasties. No fracture or dislocation is noted. IMPRESSION: No acute abnormality is noted. Electronically Signed   By: Marijo Conception M.D.   On: 12/05/2021 08:23    Procedures Procedures    Medications Ordered in ED Medications  oxyCODONE (Oxy IR/ROXICODONE)  immediate release tablet 5 mg (has no administration in time range)  ibuprofen (ADVIL) tablet 800 mg (800 mg Oral Given 12/05/21 0816)  HYDROcodone-acetaminophen (NORCO/VICODIN) 5-325 MG per tablet 1 tablet (1 tablet Oral Given 12/05/21 8937)    ED Course/ Medical Decision Making/ A&P  Medical Decision Making  Pt's x-rays are reviewed.  She has a fracture of her proximal humerus.  She is placed in a sling and instructed to f/u with ortho.  She is to return if worse.         Final Clinical Impression(s) / ED Diagnoses Final diagnoses:  Closed fracture of proximal end of right humerus, unspecified fracture morphology, initial encounter    Rx / DC Orders ED Discharge Orders          Ordered    HYDROcodone-acetaminophen (NORCO/VICODIN) 5-325 MG tablet  Every 4 hours PRN        12/05/21 0818    ibuprofen (ADVIL) 600 MG tablet  Every 6 hours PRN        12/05/21 0818              Isla Pence, MD 12/05/21 [id number]

## 2021-12-05 NOTE — ED Triage Notes (Signed)
Pt c/o fall this morning while going to the bathroom, tripped over a toy. Here for right upper arm pain, worse with movement. Also endorses hitting head and left hip pain. Pt is ambulatory. Denies LOC, no numbness/ tingling to RUE, not on thinners.

## 2021-12-06 NOTE — Telephone Encounter (Signed)
CC to Wendy May, administrator - I have called patient x2, and have been unable to reach; no voice mail available. Called sister, who was with patient at time of initial call; left message to call back regarding Dr Harrison's review for scheduling.

## 2021-12-09 NOTE — Telephone Encounter (Signed)
I called again, NA, no VM setup.

## 2021-12-18 ENCOUNTER — Other Ambulatory Visit: Payer: Self-pay

## 2021-12-18 ENCOUNTER — Ambulatory Visit: Payer: Medicaid Other | Admitting: Orthopedic Surgery

## 2021-12-18 ENCOUNTER — Encounter: Payer: Self-pay | Admitting: Orthopedic Surgery

## 2021-12-18 VITALS — BP 153/82 | HR 98 | Ht 66.0 in | Wt 197.0 lb

## 2021-12-18 DIAGNOSIS — S42294A Other nondisplaced fracture of upper end of right humerus, initial encounter for closed fracture: Secondary | ICD-10-CM

## 2021-12-18 MED ORDER — HYDROCODONE-ACETAMINOPHEN 5-325 MG PO TABS: 1.0000 | ORAL_TABLET | ORAL | 0 refills | Status: AC | PRN

## 2021-12-18 NOTE — Progress Notes (Signed)
Chief Complaint  Patient presents with   Arm Injury    RT arm fracture DOI 12/05/21    HPI: 61 year old female fell tripping over one of the toys for her cat.  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[place])    left   Bipolar 1 disorder (H[facility])    Chronic left hip pain    Chronic nausea    Depression    Diverticulosis of colon    Family hx of colon cancer    age 82-father   Folate deficiency    GERD (gastroesophageal reflux disease)    Headache    Heart murmur    Hx of abnormal Pap smear    Hyperlipidemia    Hyperthyroidism    Keratosis, actinic    Low back pain 03/01/2013   MRI with multiple levels of disc bulge   Panic attacks    Polysubstance abuse ([place])    PTSD (post-traumatic stress disorder)    S/P colonoscopy 05/30/2010   LAX sphincter tone, anal papilla, left-sided diverticulosis, normal random biopsies,, 1 polyp-TA   S/P endoscopy 05/30/2010   Dr Rourk-> non--critical Schatzki's ring, s/p 56F dilation   Seizures ([place])    Stroke ([place]) 2014   Right parietal, no deficits    Tubular adenoma of colon 05/30/2010   Next colonoscopy 05/2015   UTI (urinary tract infection)     BP (!) 153/82    Pulse 98    Ht 5\' 6"  (1.676 m)    Wt 197 lb (89.4 kg)    BMI 31.80 kg/m    General appearance: Well-developed well-nourished no gross deformities  Cardiovascular normal pulse and perfusion normal color without edema  Neurologically no sensation loss or deficits or pathologic reflexes  Psychological: Awake alert and oriented x3 mood and affect normal  Skin no lacerations or ulcerations no nodularity no palpable masses, no erythema or nodularity  Musculoskeletal: Mild tenderness over the right shoulder normal neurovascular exam  Imaging outside imaging shows a nondisplaced proximal humerus  fracture  A/P  Plan is for sling and swathe, I refilled her pain medication, return in 2 weeks for x-ray and then we can start physical therapy

## 2021-12-23 ENCOUNTER — Other Ambulatory Visit: Payer: Self-pay | Admitting: Family Medicine

## 2021-12-23 NOTE — Telephone Encounter (Signed)
Ativan refill request.  Last seen 07/18/2021, last filled 11/19/2021.

## 2022-01-01 DIAGNOSIS — S42201D Unspecified fracture of upper end of right humerus, subsequent encounter for fracture with routine healing: Secondary | ICD-10-CM | POA: Insufficient documentation

## 2022-01-02 ENCOUNTER — Other Ambulatory Visit: Payer: Self-pay | Admitting: Orthopedic Surgery

## 2022-01-02 ENCOUNTER — Other Ambulatory Visit: Payer: Self-pay

## 2022-01-02 ENCOUNTER — Other Ambulatory Visit: Payer: Self-pay | Admitting: Radiology

## 2022-01-02 ENCOUNTER — Ambulatory Visit: Payer: Medicaid Other

## 2022-01-02 ENCOUNTER — Ambulatory Visit (INDEPENDENT_AMBULATORY_CARE_PROVIDER_SITE_OTHER): Payer: Medicaid Other | Admitting: Orthopedic Surgery

## 2022-01-02 DIAGNOSIS — S42294D Other nondisplaced fracture of upper end of right humerus, subsequent encounter for fracture with routine healing: Secondary | ICD-10-CM

## 2022-01-02 MED ORDER — HYDROCODONE-ACETAMINOPHEN 5-325 MG PO TABS
1.0000 | ORAL_TABLET | Freq: Four times a day (QID) | ORAL | 0 refills | Status: AC | PRN
Start: 2022-01-02 — End: 2022-01-07

## 2022-01-02 MED ORDER — HYDROCODONE-ACETAMINOPHEN 5-325 MG PO TABS: 1.0000 | ORAL_TABLET | Freq: Four times a day (QID) | ORAL | 0 refills | Status: DC | PRN

## 2022-01-02 MED ORDER — IBUPROFEN 600 MG PO TABS: 600.0000 mg | ORAL_TABLET | Freq: Four times a day (QID) | ORAL | 0 refills | Status: DC | PRN

## 2022-01-02 NOTE — Telephone Encounter (Signed)
Medicaid rejected the Hydrocodone only 5 d supply  Please resend

## 2022-01-02 NOTE — Progress Notes (Signed)
Meds ordered this encounter  Medications  . HYDROcodone-acetaminophen (NORCO/VICODIN) 5-325 MG tablet    Sig: Take 1 tablet by mouth every 6 (six) hours as needed for up to 5 days for moderate pain.    Dispense:  20 tablet    Refill:  0    

## 2022-01-02 NOTE — Progress Notes (Signed)
Chief Complaint  Patient presents with   Arm Injury    Fracture care DOI 12/05/21   closed nondisplaced fracture of proximal end of RT humerus   Right proximal humerus fracture approxi-4 weeks ago doing well pain is getting better.  She did ask for some pain medication and we advised her on the weaning process  Recommend physical therapy her x-ray looks good  Follow-up in 6 weeks  Encounter Diagnosis  Name Primary?   Other closed nondisplaced fracture of proximal end of right humerus with routine healing, subsequent encounter 12/05/21 Yes    Meds ordered this encounter  Medications   HYDROcodone-acetaminophen (NORCO/VICODIN) 5-325 MG tablet    Sig: Take 1 tablet by mouth every 6 (six) hours as needed for moderate pain.    Dispense:  30 tablet    Refill:  0

## 2022-01-07 ENCOUNTER — Telehealth: Payer: Self-pay | Admitting: Radiology

## 2022-01-07 NOTE — Telephone Encounter (Signed)
Patient has not heard from therapy yet I gave her phone number told her will send order, it didn't get sent when she was in the office, told her to call today to schedule She voiced understanding

## 2022-01-07 NOTE — Addendum Note (Signed)
Addended byCandice Camp on: 01/07/2022 04:27 PM   Modules accepted: Orders

## 2022-01-13 ENCOUNTER — Other Ambulatory Visit: Payer: Self-pay | Admitting: Family Medicine

## 2022-01-16 ENCOUNTER — Encounter (HOSPITAL_COMMUNITY): Payer: Self-pay

## 2022-01-16 ENCOUNTER — Other Ambulatory Visit: Payer: Self-pay

## 2022-01-16 ENCOUNTER — Ambulatory Visit (HOSPITAL_COMMUNITY): Payer: Medicaid Other | Attending: Orthopedic Surgery

## 2022-01-16 DIAGNOSIS — M25511 Pain in right shoulder: Secondary | ICD-10-CM | POA: Insufficient documentation

## 2022-01-16 DIAGNOSIS — R29898 Other symptoms and signs involving the musculoskeletal system: Secondary | ICD-10-CM | POA: Insufficient documentation

## 2022-01-16 DIAGNOSIS — M25611 Stiffness of right shoulder, not elsewhere classified: Secondary | ICD-10-CM | POA: Insufficient documentation

## 2022-01-16 NOTE — Patient Instructions (Signed)
Complete the following 10-15 times.  Complete 2-3 times  a day.   1) SHOULDER: Flexion On Table   Place hands on towel placed on table, elbows straight. Lean forward with you upper body, pushing towel away from body.    2) Abduction (Passive)   With arm out to side, resting on towel placed on table with palm DOWN, keeping trunk away from table, lean to the side while pushing towel away from body.    Copyright  [employer]. All rights reserved.     3) Internal Rotation (Assistive)   Seated with elbow bent at right angle and held against side, slide arm on table surface in an inward arc keeping elbow anchored in place. Activity: Use this motion to brush crumbs off the table.      1) Seated Row   Sit up straight with elbows by your sides. Pull back with shoulders/elbows, keeping forearms straight, as if pulling back on the reins of a horse. Squeeze shoulder blades together. Repeat _10-15__times, __2-3__sets/day     3) Shoulder Extension    Sit up straight with both arms by your side, draw your arms back behind your waist. Keep your elbows straight. Repeat __10-15__times, __2-3__sets/day.

## 2022-01-17 ENCOUNTER — Other Ambulatory Visit: Payer: Self-pay | Admitting: Family Medicine

## 2022-01-18 NOTE — Therapy (Addendum)
[place], [place], [address] Phone: [phone]   Fax:  [phone]  Occupational Therapy Evaluation  Patient Details  Name: Sheryl Hall MRN: [id number] Date of Birth: 1961-06-08 Referring Provider (OT): Arther Abbott, MD   Encounter Date: 01/16/2022   OT End of Session - 01/18/22 1200     Visit Number 1    Number of Visits 12    Date for OT Re-Evaluation 02/28/22    Authorization Type [place] Medicaid    Authorization Time Period Requesting initial 3 visits    Authorization - Visit Number 0    Authorization - Number of Visits 3    OT Start Time 9371    OT Stop Time 4    OT Time Calculation (min) 36 min    Activity Tolerance Patient tolerated treatment well    Behavior During Therapy WFL for tasks assessed/performed             Past Medical History:  Diagnosis Date   Anxiety    Arthritis    Avascular necrosis of 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[place])    left   Bipolar 1 disorder ([place])    Chronic left hip pain    Chronic nausea    Depression    Diverticulosis of colon    Family hx of colon cancer    age 37-father   Folate deficiency    GERD (gastroesophageal reflux disease)    Headache    Heart murmur    Hx of abnormal Pap smear    Hyperlipidemia    Hyperthyroidism    Keratosis, actinic    Low back pain 03/01/2013   MRI with multiple levels of disc bulge   Panic attacks    Polysubstance abuse ([place])    PTSD (post-traumatic stress disorder)    S/P colonoscopy 05/30/2010   LAX sphincter tone, anal papilla, left-sided diverticulosis, normal random biopsies,, 1 polyp-TA   S/P endoscopy 05/30/2010   Dr Rourk-> non--critical Schatzki's ring, s/p 46F dilation   Seizures ([place])    Stroke ([place]) 2014   Right parietal, no deficits    Tubular adenoma of colon 05/30/2010   Next colonoscopy 05/2015   UTI (urinary tract infection)     Past Surgical History:  Procedure Laterality Date   COLONOSCOPY     every 5 years    COLONOSCOPY  05/30/2010   RMR:lax anal sphincter tone,anal papilla,otherwise normal/left-sided diverticula   ESOPHAGOGASTRODUODENOSCOPY  05/30/2010   IRC:[id number]'W ring/small HH otherwise normal   EXTERNAL EAR SURGERY Left 12 years ago   skin graft from behind ear put in ear canal   JOINT REPLACEMENT     NECK SURGERY  10 years ago   SKIN CANCER DESTRUCTION     TOTAL HIP ARTHROPLASTY Left 04/19/2014   Procedure: LEFT TOTAL HIP ARTHROPLASTY ANTERIOR APPROACH;  Surgeon: [name], MD;  Location: [facility];  Service: Orthopedics;  Laterality: Left;   TOTAL HIP ARTHROPLASTY Right 05/23/2015   Procedure: RIGHT TOTAL HIP ARTHROPLASTY ANTERIOR APPROACH;  Surgeon: Gaynelle Arabian, MD;  Location: [facility];  Service: Orthopedics;  Laterality: Right;    There were no vitals filed for this visit.   Subjective Assessment - 01/18/22 1143     Subjective  S: I tripped over one of my cat's toys at 4:30 in the morning.    Pertinent History Patient is a 61 y/o female S/P right proximal humerus fx closed nondisplaced after sustaining a mechanical fall on 12/05/21. Pt is referred by Dr. Adron Bene  for evaluation and treatment.    Patient Stated Goals to be able to use her RUE as normal as possible.    Currently in Pain? Yes    Pain Score 0-No pain   no pain with movement. With activity/movement 5 or 6/10.   Pain Location Shoulder    Pain Orientation Right    Aggravating Factors  any movement or use    Pain Relieving Factors pain medication, rest/relaxing               [facility] OT Assessment - 01/18/22 1150       Assessment   Medical Diagnosis right proximal humerus fracture    Referring Provider (OT) Arther Abbott, MD    Onset Date/Surgical Date 12/05/21    Hand Dominance Right    Next MD Visit 02/13/22    Prior Therapy None      Precautions   Precautions Shoulder    Type of Shoulder Precautions Follow standard protocol. Week 6-8 (2/23-3/9) Establish full P/ROM. Begin A/ROM. Begin extension  and IR, isometrics. gentle patient self stretching. Week 8-10 (3/9-3/23) Begin theraband for IR/er, flexion, abduction and extension. Begin supine IR/er with 1#, UBE bike, prone extension and abduction. Progress to adding weight to above exercises. Week 12 Advanced stretching and strengthening      Restrictions   Weight Bearing Restrictions Yes      Balance Screen   Has the patient fallen in the past 6 months Yes    How many times? 1    Has the patient had a decrease in activity level because of a fear of falling?  No    Is the patient reluctant to leave their home because of a fear of falling?  No      Home  Environment   Family/patient expects to be discharged to: Private residence      Prior Function   Level of Independence Independent      ADL   ADL comments Difficulty completing all ADL tasks using her RUE. Able to feed herself and push the button on the microwave.      Mobility   Mobility Status Independent      Written Expression   Dominant Hand Right      Vision - History   Baseline Vision Wears glasses only for reading      Cognition   Overall Cognitive Status Within Functional Limits for tasks assessed      Observation/Other Assessments   Other Surveys  Select    Upper Extremity Functional Index  13      Posture/Postural Control   Posture/Postural Control No significant limitations      ROM / Strength   AROM / PROM / Strength AROM;PROM;Strength      Palpation   Palpation comment Moderate fascial restrictions in the right upper arm, upper trapezius, and scapularis region.      AROM   Overall AROM Comments Assessed seated. IR/er adducted    AROM Assessment Site Shoulder    Right/Left Shoulder Right    Right Shoulder Flexion 82 Degrees    Right Shoulder ABduction 90 Degrees    Right Shoulder Internal Rotation 90 Degrees    Right Shoulder External Rotation 50 Degrees      PROM   Overall PROM Comments Assessed supine. IR/er adducted    PROM Assessment Site  Shoulder    Right/Left Shoulder Right    Right Shoulder Flexion 144 Degrees    Right Shoulder ABduction 135 Degrees    Right Shoulder  Internal Rotation 90 Degrees    Right Shoulder External Rotation 45 Degrees      Strength   Overall Strength Comments Assessed seated. IR/er adducted    Strength Assessment Site Shoulder    Right/Left Shoulder Right    Right Shoulder Flexion 3-/5    Right Shoulder ABduction 3-/5    Right Shoulder Internal Rotation 3/5    Right Shoulder External Rotation 3/5                              OT Education - 01/18/22 1159     Education Details table slides, A/ROM scapular exericses    Person(s) Educated Patient    Methods Explanation;Demonstration;Verbal cues;Handout    Comprehension Returned demonstration;Verbalized understanding              OT Short Term Goals - 01/18/22 1206       OT SHORT TERM GOAL #1   Title Pt will be educated and independent with HEP in order to faciliate her progress in therapy and be able to use her RUE actively to complete all unweighted daily tasks.    Time 3    Period Weeks    Status New    Target Date 02/06/22      OT SHORT TERM GOAL #2   Title Pt will be able to tolerate RUE P/ROM to [facility] in order to increase her ability to complete all dressing tasks using her RUE.    Time 3    Period Weeks    Status New      OT SHORT TERM GOAL #3   Title Patient will report a pain level of 4/10 when completing her HEP and basic low level reaching tasks with her RUE.    Time 3    Period Weeks    Status New      OT SHORT TERM GOAL #4   Title Patient will increase her RUE strength to 3/5 in order to begin strengthening with low level weights.    Time 3    Period Weeks    Status New               OT Long Term Goals - 01/18/22 1209       OT LONG TERM GOAL #1   Title Patient will be able to utilize her RUE to complete all daily and leisure tasks while using it as her dominant extremity.     Time 6    Period Weeks    Status New    Target Date 02/28/22      OT LONG TERM GOAL #2   Title Pt will increase her RUE A/ROM to WNL in order to be able to complete high level reaching tasks as well as fix her hair with less difficulty.    Time 6    Period Weeks    Status New      OT LONG TERM GOAL #3   Title Patient will increase her RUE strength to 4+/5 in order to lift and management items of moderate weight with less difficulty.    Time 6    Period Weeks    Status New      OT LONG TERM GOAL #4   Title Pt will report a pain level of approximately 2/10 or less when completing basic ADL tasks with her RUE.    Time 6    Period Weeks    Status New  OT LONG TERM GOAL #5   Title Pt will decrease her RUE fascial restrictions to a minimal amount or less in order to increase the functional mobility needed to complete overhead reaching.    Time 6    Period Weeks    Status New                   Plan - 01/18/22 1202     Clinical Impression Statement A: Patient is a 61 y/o female S/P right proximal humerus fx causing increased pain, fascial restrictions, and decreased ROM and strength resulting in severe difficulty completing required ADL and leisure tasks.    OT Occupational Profile and History Problem Focused Assessment - Including review of records relating to presenting problem    Occupational performance deficits (Please refer to evaluation for details): ADL's;IADL's;Leisure    Body Structure / Function / Physical Skills ADL;UE functional use;Pain;Fascial restriction;ROM;Strength;IADL;Decreased knowledge of precautions    Rehab Potential Excellent    Clinical Decision Making Limited treatment options, no task modification necessary    Comorbidities Affecting Occupational Performance: None    Modification or Assistance to Complete Evaluation  No modification of tasks or assist necessary to complete eval    OT Frequency 2x / week    OT Duration 6 weeks    OT  Treatment/Interventions Self-care/ADL training;Ultrasound;Patient/family education;DME and/or AE instruction;Passive range of motion;Cryotherapy;Electrical Stimulation;Moist Heat;Therapeutic exercise;Manual Therapy;Therapeutic activities;Neuromuscular education    Plan P: patient will benefit from skilled OT services to increase functional use of her right UE and return to using it to complete all required ADL tasks. Treatment plan: follow standard protocol. Myofascial release, passive stretching, AA/ROM, A/ROM, general strengthening. Modalities PRN. Next session: Assess if ready to progress to AA/ROM supine. Otherwise continue with passive ROM.    OT Home Exercise Plan eval: table slide, scapular A/ROM    Consulted and Agree with Plan of Care Patient             Patient will benefit from skilled therapeutic intervention in order to improve the following deficits and impairments:   Body Structure / Function / Physical Skills: ADL, UE functional use, Pain, Fascial restriction, ROM, Strength, IADL, Decreased knowledge of precautions       Visit Diagnosis: Other symptoms and signs involving the musculoskeletal system - Plan: Ot plan of care cert/re-cert  Acute pain of right shoulder - Plan: Ot plan of care cert/re-cert  Stiffness of right shoulder, not elsewhere classified - Plan: Ot plan of care cert/re-cert    Problem List Patient Active Problem List   Diagnosis Date Noted   Closed fracture of proximal end of right humerus with routine healing 01/01/2022   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   Avascular necrosis of femur head, right ([place]) 05/23/2015   Constipation 09/20/2014   Loose stools 08/02/2014   Postoperative anemia due to acute blood loss 04/20/2014   Avascular necrosis of femur head, left ([place]) 04/19/2014   OA (osteoarthritis) of hip 04/19/2014   Partial epilepsy with impairment of consciousness ([place]) 04/13/2014   Pain in limb 12/19/2013   History of stroke 12/19/2013    Acute confusional state 03/24/2013   Low back pain    PTSD (post-traumatic stress disorder) 03/04/2013   Bipolar disorder ([place]) 03/04/2013   Anxiety    Depression    Stroke ([place])    Hyperthyroidism    Hyperlipidemia    Folate deficiency    Hx of abnormal Pap smear    Abdominal bloating 11/05/2011   IBS (irritable bowel  syndrome) 11/05/2011   Chronic diarrhea 09/18/2011   Gastroparesis 09/18/2011   Drug abuse ([place]) 09/18/2011   Tubular adenoma of colon 09/18/2011   Family hx of colon cancer 09/18/2011   COLONIC POLYPS, ADENOMATOUS 07/08/2010   DIZZINESS 07/08/2010   GERD 05/16/2010   WEIGHT LOSS 05/16/2010   NAUSEA WITH VOMITING 05/16/2010   Dysphagia 05/16/2010   DIARRHEA 05/16/2010   ABDOMINAL PAIN, UNSPECIFIED SITE 05/16/2010    Ailene Ravel, OTR/L,CBIS  [phone]  01/18/2022, 12:38 PM  [facility] [address] [place], [place], [address] Phone: [phone]   Fax:  [phone]  Name: KAILI CASTILLE MRN: [id number] Date of Birth: 1961/05/17

## 2022-01-21 ENCOUNTER — Other Ambulatory Visit: Payer: Self-pay | Admitting: Family Medicine

## 2022-01-22 NOTE — Telephone Encounter (Signed)
LOV 07/18/21 ?Last refill 12/23/21, #60, 0 refills ? ?Please review, thanks! ?

## 2022-01-30 ENCOUNTER — Ambulatory Visit (HOSPITAL_COMMUNITY): Payer: Medicaid Other | Admitting: Occupational Therapy

## 2022-01-30 ENCOUNTER — Telehealth (HOSPITAL_COMMUNITY): Payer: Self-pay | Admitting: Family Medicine

## 2022-01-30 ENCOUNTER — Encounter (HOSPITAL_COMMUNITY): Payer: Self-pay

## 2022-01-30 NOTE — Telephone Encounter (Signed)
Pt stated she could not get a ride to her appointment. Will call back to reschedule ?

## 2022-02-06 ENCOUNTER — Encounter (HOSPITAL_COMMUNITY): Payer: Medicaid Other | Admitting: Occupational Therapy

## 2022-02-12 ENCOUNTER — Telehealth (HOSPITAL_COMMUNITY): Payer: Self-pay | Admitting: Occupational Therapy

## 2022-02-12 NOTE — Telephone Encounter (Signed)
L/m to cx no reason  ?

## 2022-02-13 ENCOUNTER — Encounter: Payer: Medicaid Other | Admitting: Orthopedic Surgery

## 2022-02-13 ENCOUNTER — Other Ambulatory Visit (HOSPITAL_COMMUNITY): Payer: Self-pay | Admitting: Family Medicine

## 2022-02-13 ENCOUNTER — Encounter (HOSPITAL_COMMUNITY): Payer: Medicaid Other | Admitting: Occupational Therapy

## 2022-02-17 ENCOUNTER — Other Ambulatory Visit: Payer: Self-pay

## 2022-02-17 ENCOUNTER — Ambulatory Visit (INDEPENDENT_AMBULATORY_CARE_PROVIDER_SITE_OTHER): Payer: Medicaid Other | Admitting: Orthopedic Surgery

## 2022-02-17 DIAGNOSIS — S42294D Other nondisplaced fracture of upper end of right humerus, subsequent encounter for fracture with routine healing: Secondary | ICD-10-CM

## 2022-02-17 NOTE — Progress Notes (Signed)
FOLLOW UP  ? ?Encounter Diagnosis  ?Name Primary?  ? Other closed nondisplaced fracture of proximal end of right humerus with routine healing, subsequent encounter 12/05/21 Yes  ? ? ? ?Chief Complaint  ?Patient presents with  ? fracture care  ?  DOI 12/05/21 ?RT shoulder other closed nondisplaced fracture of proximal end of RT humerus ?Pt states she only went to PT 1 x because they could never get her in  ? ? ?Sheryl Hall had a right proximal humerus fracture treated nonoperatively with injury date January 12 she cannot go to physical therapy she is trying to do it on her own ? 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 ?Her active motion is abduction 90 flexion 115 ? ?At 2-1/2 months post injury and minimal therapy other than home exercises I am not surprised by her limited motion recommend that she continue home therapy follow-up in a few months in July she will be 6 months out at that point and we should have a good idea of what her final result will be ?

## 2022-02-24 ENCOUNTER — Other Ambulatory Visit (HOSPITAL_COMMUNITY): Payer: Self-pay | Admitting: Family Medicine

## 2022-02-26 NOTE — Telephone Encounter (Signed)
LOV 07/18/21 ?Last refill 01/22/22, #60, 0 refills ? ?Please review, thanks! ? ? ?

## 2022-02-27 ENCOUNTER — Encounter (HOSPITAL_COMMUNITY): Payer: Self-pay | Admitting: Occupational Therapy

## 2022-02-27 ENCOUNTER — Telehealth (HOSPITAL_COMMUNITY): Payer: Self-pay | Admitting: Occupational Therapy

## 2022-02-27 ENCOUNTER — Encounter (HOSPITAL_COMMUNITY): Payer: Medicaid Other | Admitting: Occupational Therapy

## 2022-02-27 NOTE — Telephone Encounter (Signed)
Attempted to call pt in regards to today's OT appt at 1:00. Per chart review pt told MD she was completing therapy at home. Pt attended evaluation in February, has cancelled 2 appointments and was a no-show for 2 appointments including today. Pt was unable to be reached by phone, no voicemail available. Pt will be discharged from therapy services and all remaining appointments cancelled, letter sent.  ? ? ?Guadelupe Sabin, OTR/L  ?[phone] ? ?

## 2022-02-27 NOTE — Therapy (Signed)
[facility] ?[address] ?[place], [place], [address] ?Phone: [phone]   Fax:  [phone] ? ?Patient Details  ?Name: Sheryl Hall ?MRN: [id number] ?Date of Birth: [birth year] ?Referring Provider:  No ref. provider found ? ?Encounter Date: 02/27/2022 ? ? ?OCCUPATIONAL THERAPY DISCHARGE SUMMARY ? ?Visits from Start of Care: 1 ? ?Current functional level related to goals / functional outcomes: ?Unknown. Pt has not returned to therapy since evaluation in 12/2021. Pt has missed 2 appointments and cancelled 2 additional appointments, is being discharged due to attendance.  ?  ?Remaining deficits: ?Unknown  ?  ?Education / Equipment: ?HEP at evaluation  ? ?Patient agrees to discharge. Patient goals were not met. Patient is being discharged due to not returning since the last visit.. ? ? ? ? ?Guadelupe Sabin, OTR/L  ?[phone] ?02/27/2022, 1:25 PM ? ? ?[facility] ?[address] ?[place], [place], [address] ?Phone: [phone]   Fax:  [phone] ?

## 2022-03-06 ENCOUNTER — Encounter (HOSPITAL_COMMUNITY): Payer: Medicaid Other

## 2022-03-12 ENCOUNTER — Ambulatory Visit: Payer: Medicaid Other | Admitting: Neurology

## 2022-03-12 ENCOUNTER — Encounter: Payer: Self-pay | Admitting: Neurology

## 2022-03-12 ENCOUNTER — Other Ambulatory Visit: Payer: Self-pay | Admitting: Neurology

## 2022-03-12 NOTE — Telephone Encounter (Signed)
Rx refilled. Pt needs appointment for future refills. Note given to pharmacy. ?

## 2022-03-12 NOTE — Progress Notes (Deleted)
Patient: Sheryl Hall Date of Birth: 08-19-1961  Reason for Visit : follow up for seizures History From: patient Primary Neurologist:   HISTORY OF PRESENT ILLNESS: Today 03/12/22 Sheryl Hall here today for follow-up.  03/11/2021 SS: Sheryl Hall is a 61 year old female with history of seizures.  She has not been seen since October 2019.  She remains on Keppra.  Denies any recent seizures.  She lives alone, drives a car.  Indicates no changes to her overall health.  She has no problems today.  Here today for evaluation unaccompanied.  HISTORY  09/14/2018 MM: Sheryl Hall is a 61 year old female with a history of seizures and memory disturbance.  She returns today for follow-up. She denies any seizure events.  She continues on Keppra.  She feels that her memory has remained stable.  She lives at home alone.  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  She manages her own finances.  She prepares most without difficulty.  She manages her own medication and appointments.  She returns today for evaluation.  REVIEW OF SYSTEMS: Out of a complete 14 system review of symptoms, the patient complains only of the following symptoms, and all other reviewed systems are negative.  n/a  ALLERGIES: No Known Allergies  HOME MEDICATIONS: Outpatient Medications Prior to Visit  Medication Sig Dispense Refill   atorvastatin (LIPITOR) 40 MG tablet TAKE (1) TABLET BY MOUTH ONCE DAILY. 90 tablet 0   brexpiprazole (REXULTI) 1 MG TABS tablet Take 1 tablet (1 mg total) by mouth daily. 30 tablet 3   escitalopram (LEXAPRO) 20 MG tablet TAKE (1) TABLET BY MOUTH ONCE DAILY. 30 tablet 2   ibuprofen (ADVIL) 600 MG tablet Take 1 tablet (600 mg total) by mouth every 6 (six) hours as needed. 20 tablet 0   levETIRAcetam (KEPPRA) 1000 MG tablet Take 1/2 tablet AM; take 1.5 tablets PM 180 tablet 4   levothyroxine (SYNTHROID) 50 MCG tablet Take 1 tablet (50 mcg total) by mouth daily. 90  tablet 3   LORazepam (ATIVAN) 1 MG tablet TAKE (1) TABLET BY MOUTH TWICE DAILY AS NEEDED FOR ANXIETY. 60 tablet 0   OLANZapine (ZYPREXA) 10 MG tablet TAKE (1) TABLET BY MOUTH AT BEDTIME. 30 tablet 2   omeprazole (PRILOSEC) 40 MG capsule TAKE (1) CAPSULE BY MOUTH TWICE DAILY. 180 capsule 3   Oxcarbazepine (TRILEPTAL) 300 MG tablet TAKE (1) TABLET BY MOUTH TWICE DAILY. 180 tablet 0   valACYclovir (VALTREX) 500 MG tablet TAKE ONE TABLET BY MOUTH ONCE DAILY. 30 tablet 0   No facility-administered medications prior to visit.    PAST MEDICAL HISTORY: Past Medical History:  Diagnosis Date   Anxiety    Arthritis    Avascular necrosis of hip (HCC)    left   Bipolar 1 disorder ([place])    Chronic left hip pain    Chronic nausea    Depression    Diverticulosis of colon    Family hx of colon cancer    age 33-father   Folate deficiency    GERD (gastroesophageal reflux disease)    Headache    Heart murmur    Hx of abnormal Pap smear    Hyperlipidemia    Hyperthyroidism    Keratosis, actinic    Low back pain 03/01/2013   MRI with multiple levels of disc bulge   Panic attacks    Polysubstance abuse (HCC)    PTSD (post-traumatic stress disorder)    S/P colonoscopy  05/30/2010   LAX sphincter tone, anal papilla, left-sided diverticulosis, normal random biopsies,, 1 polyp-TA   S/P endoscopy 05/30/2010   Dr Rourk-> non--critical Schatzki's ring, s/p 51F dilation   Seizures ([place])    Stroke [facility]) 2014   Right parietal, no deficits    Tubular adenoma of colon 05/30/2010   Next colonoscopy 05/2015   UTI (urinary tract infection)     PAST SURGICAL HISTORY: Past Surgical History:  Procedure Laterality Date   COLONOSCOPY     every 5 years   COLONOSCOPY  05/30/2010   RMR:lax anal sphincter tone,anal papilla,otherwise normal/left-sided diverticula   ESOPHAGOGASTRODUODENOSCOPY  05/30/2010   LGX:[id number]'K ring/small HH otherwise normal   EXTERNAL EAR SURGERY Left 12 years ago   skin graft from  behind ear put in ear canal   JOINT REPLACEMENT     NECK SURGERY  10 years ago   SKIN CANCER DESTRUCTION     TOTAL HIP ARTHROPLASTY Left 04/19/2014   Procedure: LEFT TOTAL HIP ARTHROPLASTY ANTERIOR APPROACH;  Surgeon: [name], MD;  Location: [facility];  Service: Orthopedics;  Laterality: Left;   TOTAL HIP ARTHROPLASTY Right 05/23/2015   Procedure: RIGHT TOTAL HIP ARTHROPLASTY ANTERIOR APPROACH;  Surgeon: Gaynelle Arabian, MD;  Location: [facility];  Service: Orthopedics;  Laterality: Right;    FAMILY HISTORY: Family History  Problem Relation Age of Onset   Diabetes Mother    Hypertension Mother    Colon cancer Father 34       living    Hypertension Father    Hypertension Sister    Heart attack Brother    Seizures Maternal Aunt     SOCIAL HISTORY: Social History   Socioeconomic History   Marital status: Divorced    Spouse name: Not on file   Number of children: 1   Years of education: Not on file   Highest education level: Not on file  Occupational History   Occupation: disabled  Tobacco Use   Smoking status: Every Day    Packs/day: 0.50    Years: 30.00    Pack years: 15.00    Types: Cigarettes   Smokeless tobacco: Never   Tobacco comments:    trying to quit  Vaping Use   Vaping Use: Never used  Substance and Sexual Activity   Alcohol use: No    Alcohol/week: 0.0 standard drinks   Drug use: No    Comment: hx cocaine abuse   Sexual activity: Never    Birth control/protection: None  Other Topics Concern   Not on file  Social History Narrative   Patient lives at home alone and she is single.    Disabled.   Education college education.   Right handed.   Caffeine mountain dew four daily.    Social Determinants of Health   Financial Resource Strain: Not on file  Food Insecurity: Not on file  Transportation Needs: Not on file  Physical Activity: Not on file  Stress: Not on file  Social Connections: Not on file  Intimate Partner Violence: Not on file    PHYSICAL EXAM  There were no vitals filed for this visit.  There is no height or weight on file to calculate BMI.  Generalized: Well developed, in no acute distress   Neurological examination  Mentation: Alert oriented to time, place, history taking. Follows all commands speech and language fluent Cranial nerve II-XII: Pupils were equal round reactive to light. Extraocular movements were full, visual field were full on confrontational test. Facial sensation and strength  were normal.  Head turning and shoulder shrug  were normal and symmetric. Motor: The motor testing reveals 5 over 5 strength of all 4 extremities. Good symmetric motor tone is noted throughout.  Sensory: Sensory testing is intact to soft touch on all 4 extremities. No evidence of extinction is noted.  Coordination: Cerebellar testing reveals good finger-nose-finger and heel-to-shin bilaterally.  Gait and station: Gait is normal. Tandem gait is unsteady. Reflexes: Deep tendon reflexes are symmetric and normal bilaterally.   DIAGNOSTIC DATA (LABS, IMAGING, TESTING) - I reviewed patient records, labs, notes, testing and imaging myself where available.  Lab Results  Component Value Date   WBC 9.3 07/18/2021   HGB 12.3 07/18/2021   HCT 39.8 07/18/2021   MCV 81.4 07/18/2021   PLT 264 07/18/2021      Component Value Date/Time   NA 141 07/18/2021 1454   NA 142 09/07/2017 1459   NA 144 10/27/2014 0625   K 4.3 07/18/2021 1454   K 4.2 10/27/2014 0625   K 4.4 08/27/2011 1314   CL 106 07/18/2021 1454   CL 107 10/27/2014 0625   CL 102 08/27/2011 1314   CO2 27 07/18/2021 1454   CO2 29 10/27/2014 0625   CO2 25 08/27/2011 1314   GLUCOSE 87 07/18/2021 1454   GLUCOSE 94 10/27/2014 0625   BUN 5 (L) 07/18/2021 1454   BUN 7 09/07/2017 1459   BUN 10 10/27/2014 0625   CREATININE 1.06 (H) 07/18/2021 1454   CALCIUM 8.7 07/18/2021 1454   CALCIUM 8.9 10/27/2014 0625   CALCIUM 9.9 08/27/2011 1314   PROT 5.9 (L) 07/18/2021  1454   PROT 6.1 09/07/2017 1459   PROT 5.9 (L) 10/27/2014 0625   ALBUMIN 3.1 (L) 07/21/2019 1723   ALBUMIN 4.1 09/07/2017 1459   ALBUMIN 3.3 (L) 10/27/2014 0625   AST 14 07/18/2021 1454   AST 16 10/27/2014 0625   AST 11 08/27/2011 1314   ALT 10 07/18/2021 1454   ALT 14 10/27/2014 0625   ALKPHOS 128 (H) 07/21/2019 1723   ALKPHOS 119 (H) 10/27/2014 0625   ALKPHOS 91 08/27/2011 1314   BILITOT 0.3 07/18/2021 1454   BILITOT <0.2 09/07/2017 1459   BILITOT 0.4 10/27/2014 0625   BILITOT 0.5 08/27/2011 1314   GFRNONAA 58 (L) 05/14/2020 1528   GFRAA 68 05/14/2020 1528   Lab Results  Component Value Date   CHOL 144 07/18/2021   HDL 45 (L) 07/18/2021   LDLCALC 78 07/18/2021   TRIG 133 07/18/2021   CHOLHDL 3.2 07/18/2021   No results found for: HGBA1C Lab Results  Component Value Date   VITAMINB12 1,089 12/14/2018   Lab Results  Component Value Date   TSH 2.63 07/18/2021   ASSESSMENT AND PLAN 61 y.o. year old female  has a past medical history of Anxiety, Arthritis, Avascular necrosis of hip ([place]), Bipolar 1 disorder ([place]), Chronic left hip pain, Chronic nausea, Depression, Diverticulosis of colon, Family hx of colon cancer, Folate deficiency, GERD (gastroesophageal reflux disease), Headache, Heart murmur, abnormal Pap smear, Hyperlipidemia, Hyperthyroidism, Keratosis, actinic, Low back pain (03/01/2013), Panic attacks, Polysubstance abuse ([place]), PTSD (post-traumatic stress disorder), S/P colonoscopy (05/30/2010), S/P endoscopy (05/30/2010), Seizures ([place]), Stroke ([place]) (2014), Tubular adenoma of colon (05/30/2010), and UTI (urinary tract infection). here with:  1.  Seizures -Has remained stable, doing quite well, no recurrent seizure in several years -Continue Keppra at current dosing -Call for seizure activity, otherwise follow-up 1 year or sooner if needed, ok for 15 min VV   Butler Denmark,  AGNP-C, DNP 03/12/2022, 5:54 AM [facility] [address], [place], [place] [address] [phone]

## 2022-03-14 ENCOUNTER — Other Ambulatory Visit (HOSPITAL_COMMUNITY): Payer: Self-pay | Admitting: Family Medicine

## 2022-03-28 ENCOUNTER — Other Ambulatory Visit: Payer: Self-pay | Admitting: Family Medicine

## 2022-03-28 NOTE — Telephone Encounter (Signed)
LOV 07/21/21 ?Last refill 02/27/22, #60, 0 refills ? ?Please review, thanks! ? ?

## 2022-03-28 NOTE — Telephone Encounter (Signed)
Requested medication (s) are due for refill today - yes ? ?Requested medication (s) are on the active medication list -yes ? ?Future visit scheduled -no ? ?Last refill: 02/27/22 #60 ? ?Notes to clinic: non delegated Rx ? ?Requested Prescriptions  ?Pending Prescriptions Disp Refills  ? LORazepam (ATIVAN) 1 MG tablet [Pharmacy Med Name: LORAZEPAM 1 MG TABLET] 60 tablet 0  ?  Sig: TAKE (1) TABLET BY MOUTH TWICE DAILY AS NEEDED FOR ANXIETY.  ?  ? Not Delegated - Psychiatry: Anxiolytics/Hypnotics 2 Failed - 03/28/2022 10:22 AM  ?  ?  Failed - This refill cannot be delegated  ?  ?  Failed - Urine Drug Screen completed in last 360 days  ?  ?  Failed - Valid encounter within last 6 months  ?  Recent Outpatient Visits   ? ?      ? 8 months ago Other specified hypothyroidism  ? [facility] Pickard, Cammie Mcgee, MD  ? 1 year ago Bipolar I disorder [facility])  ? [facility] Pickard, Cammie Mcgee, MD  ? 1 year ago Other specified hypothyroidism  ? [facility] Pickard, Cammie Mcgee, MD  ? 3 years ago Chest pain due to gastrointestinal reflux disease  ? [facility] Pickard, Cammie Mcgee, MD  ? 3 years ago Hypothyroidism, unspecified type  ? [facility] Pickard, Cammie Mcgee, MD  ? ?  ?  ? ? ?  ?  ?  Passed - Patient is not pregnant  ?  ?  ? ? ? ?Requested Prescriptions  ?Pending Prescriptions Disp Refills  ? LORazepam (ATIVAN) 1 MG tablet [Pharmacy Med Name: LORAZEPAM 1 MG TABLET] 60 tablet 0  ?  Sig: TAKE (1) TABLET BY MOUTH TWICE DAILY AS NEEDED FOR ANXIETY.  ?  ? Not Delegated - Psychiatry: Anxiolytics/Hypnotics 2 Failed - 03/28/2022 10:22 AM  ?  ?  Failed - This refill cannot be delegated  ?  ?  Failed - Urine Drug Screen completed in last 360 days  ?  ?  Failed - Valid encounter within last 6 months  ?  Recent Outpatient Visits   ? ?      ? 8 months ago Other specified hypothyroidism  ? [facility] Pickard, Cammie Mcgee, MD  ? 1 year ago Bipolar I disorder  [facility])  ? [facility] Pickard, Cammie Mcgee, MD  ? 1 year ago Other specified hypothyroidism  ? [facility] Pickard, Cammie Mcgee, MD  ? 3 years ago Chest pain due to gastrointestinal reflux disease  ? [facility] Pickard, Cammie Mcgee, MD  ? 3 years ago Hypothyroidism, unspecified type  ? [facility] Pickard, Cammie Mcgee, MD  ? ?  ?  ? ? ?  ?  ?  Passed - Patient is not pregnant  ?  ?  ? ? ? ?

## 2022-04-10 ENCOUNTER — Other Ambulatory Visit: Payer: Self-pay | Admitting: Neurology

## 2022-04-11 ENCOUNTER — Other Ambulatory Visit: Payer: Self-pay | Admitting: Family Medicine

## 2022-04-14 NOTE — Telephone Encounter (Signed)
Requested Prescriptions  Pending Prescriptions Disp Refills  . escitalopram (LEXAPRO) 20 MG tablet [Pharmacy Med Name: ESCITALOPRAM 20 MG TABLET] 30 tablet 1    Sig: TAKE (1) TABLET BY MOUTH ONCE DAILY.     Psychiatry:  Antidepressants - SSRI Failed - 04/11/2022  5:14 PM      Failed - Valid encounter within last 6 months    Recent Outpatient Visits          9 months ago Other specified hypothyroidism   [place] Pickard, Cammie Mcgee, MD   1 year ago Bipolar I disorder [facility])   [place] [facility] Pickard, Cammie Mcgee, MD   1 year ago Other specified hypothyroidism   [place] Susy Frizzle, MD   3 years ago Chest pain due to gastrointestinal reflux disease   [place] Susy Frizzle, MD   3 years ago Hypothyroidism, unspecified type   [place], Cammie Mcgee, MD      Future Appointments            In 3 weeks Pickard, Cammie Mcgee, MD [place], [facility]           Passed - Completed PHQ-2 or PHQ-9 in the last 360 days

## 2022-04-18 ENCOUNTER — Other Ambulatory Visit: Payer: Self-pay | Admitting: Family Medicine

## 2022-04-22 NOTE — Telephone Encounter (Signed)
Requested Prescriptions  Pending Prescriptions Disp Refills  . Oxcarbazepine (TRILEPTAL) 300 MG tablet [Pharmacy Med Name: OXCARBAZEPINE 300 MG TABLET] 180 tablet 0    Sig: TAKE (1) TABLET BY MOUTH TWICE DAILY.     Neurology: Anticonvulsants - oxcarbazepine Failed - 04/18/2022  9:07 AM      Failed - Cr in normal range and within 360 days    Creat  Date Value Ref Range Status  07/18/2021 1.06 (H) 0.50 - 1.03 mg/dL Final    Comment:    Verified by repeat analysis. .    Creatinine,U  Date Value Ref Range Status  07/08/2010 111.9 mg/dL Final    Comment:    See lab report for associated comment(s)         Passed - Na in normal range and within 360 days    Sodium  Date Value Ref Range Status  07/18/2021 141 135 - 146 mmol/L Final  09/07/2017 142 134 - 144 mmol/L Final  10/27/2014 144 136 - 145 mmol/L Final         Passed - WBC in normal range and within 360 days    WBC  Date Value Ref Range Status  07/18/2021 9.3 3.8 - 10.8 Thousand/uL Final         Passed - PLT in normal range and within 360 days    Platelets  Date Value Ref Range Status  07/18/2021 264 140 - 400 Thousand/uL Final  08/25/2016 255 150 - 379 x10E3/uL Final         Passed - HCT in normal range and within 360 days    HCT  Date Value Ref Range Status  07/18/2021 39.8 35.0 - 45.0 % Final  09/18/2011 45 % Final   Hematocrit  Date Value Ref Range Status  08/25/2016 39.9 34.0 - 46.6 % Final         Passed - HGB in normal range and within 360 days    Hemoglobin  Date Value Ref Range Status  07/18/2021 12.3 11.7 - 15.5 g/dL Final  08/25/2016 13.0 11.1 - 15.9 g/dL Final         Passed - Completed PHQ-2 or PHQ-9 in the last 360 days      Passed - Valid encounter within last 12 months    Recent Outpatient Visits          9 months ago Other specified hypothyroidism   [place] Pickard, Cammie Mcgee, MD   1 year ago Bipolar I disorder [facility])   [place] Pickard, Cammie Mcgee, MD   1 year ago Other specified hypothyroidism   [place] Susy Frizzle, MD   3 years ago Chest pain due to gastrointestinal reflux disease   [place] Susy Frizzle, MD   3 years ago Hypothyroidism, unspecified type   [place] Pickard, Cammie Mcgee, MD      Future Appointments            In 1 week Pickard, Cammie Mcgee, MD [place], [facility]

## 2022-04-25 ENCOUNTER — Other Ambulatory Visit: Payer: Self-pay | Admitting: Family Medicine

## 2022-04-25 NOTE — Telephone Encounter (Signed)
Requested medication (s) are due for refill today: yes  Requested medication (s) are on the active medication list: yes  Last refill:  03/28/22 #60/0  Future visit scheduled: yes  Notes to clinic:  Unable to refill per protocol, cannot delegate.    Requested Prescriptions  Pending Prescriptions Disp Refills   LORazepam (ATIVAN) 1 MG tablet [Pharmacy Med Name: LORAZEPAM 1 MG TABLET] 60 tablet 0    Sig: TAKE (1) TABLET BY MOUTH TWICE DAILY AS NEEDED FOR ANXIETY.     Not Delegated - Psychiatry: Anxiolytics/Hypnotics 2 Failed - 04/25/2022  9:49 AM      Failed - This refill cannot be delegated      Failed - Urine Drug Screen completed in last 360 days      Failed - Valid encounter within last 6 months    Recent Outpatient Visits           9 months ago Other specified hypothyroidism   [place] Pickard, Cammie Mcgee, MD   1 year ago Bipolar I disorder [facility])   [place] Pickard, Cammie Mcgee, MD   1 year ago Other specified hypothyroidism   [place] Susy Frizzle, MD   3 years ago Chest pain due to gastrointestinal reflux disease   [place] Susy Frizzle, MD   3 years ago Hypothyroidism, unspecified type   [place], Cammie Mcgee, MD       Future Appointments             In 1 week Pickard, Cammie Mcgee, MD [place], [place] - Patient is not pregnant

## 2022-05-05 ENCOUNTER — Ambulatory Visit (INDEPENDENT_AMBULATORY_CARE_PROVIDER_SITE_OTHER): Payer: Medicaid Other | Admitting: Family Medicine

## 2022-05-05 VITALS — BP 120/92 | HR 120 | Temp 98.4°F | Ht 66.0 in | Wt 192.0 lb

## 2022-05-05 DIAGNOSIS — R739 Hyperglycemia, unspecified: Secondary | ICD-10-CM | POA: Diagnosis not present

## 2022-05-05 DIAGNOSIS — F319 Bipolar disorder, unspecified: Secondary | ICD-10-CM | POA: Diagnosis not present

## 2022-05-05 DIAGNOSIS — E038 Other specified hypothyroidism: Secondary | ICD-10-CM

## 2022-05-05 MED ORDER — LORAZEPAM 1 MG PO TABS
1.0000 mg | ORAL_TABLET | Freq: Two times a day (BID) | ORAL | 0 refills | Status: DC
Start: 2022-05-05 — End: 2022-06-04

## 2022-05-05 MED ORDER — VORTIOXETINE HBR 10 MG PO TABS: 10.0000 mg | ORAL_TABLET | Freq: Every day | ORAL | 3 refills | Status: DC

## 2022-05-05 NOTE — Progress Notes (Signed)
Subjective:    Patient ID: Sheryl Hall, female    DOB: [birth year], 61 y.o.   MRN: [id number]  Patient is a very pleasant 61 year old Caucasian female who presents today to discuss her medications.  She has a history of bipolar disorder.  She is currently on Lexapro, Trileptal as a mood stabilizer, and Zyprexa as well.  She has been on this combination for quite some time.  She takes Keppra for seizure prevention.  She is here today with her friend.  Her friend states that the patient wants to sleep all day long.  The patient states she does not want to get out of bed.  She reports feeling sad.  She reports feeling anhedonia.  She will then stay up all night because she is restless.  She denies any manic symptoms.  She denies racing thoughts or impulsive behavior.  She denies suicidal thoughts.  Recently her friends have been checking her blood sugar and random blood sugars have been around 160.  She is eating poorly primarily Cheetos and junk food and sodas because she does not want to even cook or eat healthy. Past Medical History:  Diagnosis Date   Anxiety    Arthritis    Avascular necrosis of hip (HCC)    left   Bipolar 1 disorder ([place])    Chronic left hip pain    Chronic nausea    Depression    Diverticulosis of colon    Family hx of colon cancer    age 78-father   Folate deficiency    GERD (gastroesophageal reflux disease)    Headache    Heart murmur    Hx of abnormal Pap smear    Hyperlipidemia    Hyperthyroidism    Keratosis, actinic    Low back pain 03/01/2013   MRI with multiple levels of disc bulge   Panic attacks    Polysubstance abuse (HCC)    PTSD (post-traumatic stress disorder)    S/P colonoscopy 05/30/2010   LAX sphincter tone, anal papilla, left-sided diverticulosis, normal random biopsies,, 1 polyp-TA   S/P endoscopy 05/30/2010   Dr Rourk-> non--critical Schatzki's ring, s/p 58F dilation   Seizures ([place])    Stroke ([place]) 2014   Right parietal, no deficits     Tubular adenoma of colon 05/30/2010   Next colonoscopy 05/2015   UTI (urinary tract infection)    Past Surgical History:  Procedure Laterality Date   COLONOSCOPY     every 5 years   COLONOSCOPY  05/30/2010   RMR:lax anal sphincter tone,anal papilla,otherwise normal/left-sided diverticula   ESOPHAGOGASTRODUODENOSCOPY  05/30/2010   LAG:[id number]'Y ring/small HH otherwise normal   EXTERNAL EAR SURGERY Left 12 years ago   skin graft from behind ear put in ear canal   JOINT REPLACEMENT     NECK SURGERY  10 years ago   SKIN CANCER DESTRUCTION     TOTAL HIP ARTHROPLASTY Left 04/19/2014   Procedure: LEFT TOTAL HIP ARTHROPLASTY ANTERIOR APPROACH;  Surgeon: [name], MD;  Location: [facility];  Service: Orthopedics;  Laterality: Left;   TOTAL HIP ARTHROPLASTY Right 05/23/2015   Procedure: RIGHT TOTAL HIP ARTHROPLASTY ANTERIOR APPROACH;  Surgeon: Gaynelle Arabian, MD;  Location: [facility];  Service: Orthopedics;  Laterality: Right;   Current Outpatient Medications on File Prior to Visit  Medication Sig Dispense Refill   escitalopram (LEXAPRO) 20 MG tablet TAKE (1) TABLET BY MOUTH ONCE DAILY. 30 tablet 1   levETIRAcetam (KEPPRA) 1000 MG tablet TAKE 1/2 TABLET IN THE  MORNING AND 1&1/2 TABLETS IN THE EVENING. 60 tablet 0   levothyroxine (SYNTHROID) 50 MCG tablet Take 1 tablet (50 mcg total) by mouth daily. 90 tablet 3   OLANZapine (ZYPREXA) 10 MG tablet TAKE (1) TABLET BY MOUTH AT BEDTIME. 30 tablet 2   omeprazole (PRILOSEC) 40 MG capsule TAKE (1) CAPSULE BY MOUTH TWICE DAILY. 180 capsule 3   Oxcarbazepine (TRILEPTAL) 300 MG tablet TAKE (1) TABLET BY MOUTH TWICE DAILY. 180 tablet 0   valACYclovir (VALTREX) 500 MG tablet TAKE ONE TABLET BY MOUTH ONCE DAILY. 30 tablet 0   atorvastatin (LIPITOR) 40 MG tablet TAKE (1) TABLET BY MOUTH ONCE DAILY. (Patient not taking: Reported on 05/05/2022) 90 tablet 0   ibuprofen (ADVIL) 600 MG tablet Take 1 tablet (600 mg total) by mouth every 6 (six) hours as needed.  (Patient not taking: Reported on 05/05/2022) 20 tablet 0   [DISCONTINUED] LINZESS 72 MCG capsule TAKE ONE CAPSULE ORALLY EVERY MORNING BEFORE BREAKFAST. (Patient taking differently: Take 72 mcg by mouth daily before breakfast. ) 30 capsule 1   [DISCONTINUED] traZODone (DESYREL) 100 MG tablet TAKE (1) TABLET BY MOUTH AT BEDTIME. 30 tablet 0   No current facility-administered medications on file prior to visit.   No Known Allergies Social History   Socioeconomic History   Marital status: Divorced    Spouse name: Not on file   Number of children: 1   Years of education: Not on file   Highest education level: Not on file  Occupational History   Occupation: disabled  Tobacco Use   Smoking status: Every Day    Packs/day: 0.50    Years: 30.00    Total pack years: 15.00    Types: Cigarettes   Smokeless tobacco: Never   Tobacco comments:    trying to quit  Vaping Use   Vaping Use: Never used  Substance and Sexual Activity   Alcohol use: No    Alcohol/week: 0.0 standard drinks of alcohol   Drug use: No    Comment: hx cocaine abuse   Sexual activity: Never    Birth control/protection: None  Other Topics Concern   Not on file  Social History Narrative   Patient lives at home alone and she is single.    Disabled.   Education college education.   Right handed.   Caffeine mountain dew four daily.    Social Determinants of Health   Financial Resource Strain: Not on file  Food Insecurity: Not on file  Transportation Needs: Not on file  Physical Activity: Not on file  Stress: Not on file  Social Connections: Not on file  Intimate Partner Violence: Not on file     Review of Systems  All other systems reviewed and are negative.      Objective:   Physical Exam Vitals reviewed.  Constitutional:      General: She is not in acute distress.    Appearance: She is well-developed. She is not diaphoretic.  HENT:     Right Ear: Tympanic membrane, ear canal and external ear  normal.     Left Ear: Tympanic membrane, ear canal and external ear normal.     Nose: Nose normal.     Mouth/Throat:     Pharynx: No oropharyngeal exudate.  Eyes:     Extraocular Movements: Extraocular movements intact.     Pupils: Pupils are equal, round, and reactive to light.  Neck:     Thyroid: No thyromegaly.     Vascular: No JVD.  Cardiovascular:  Rate and Rhythm: Normal rate and regular rhythm.     Heart sounds: Normal heart sounds. No murmur heard. Pulmonary:     Effort: Pulmonary effort is normal. No respiratory distress.     Breath sounds: Normal breath sounds. No stridor. No wheezing, rhonchi or rales.  Abdominal:     General: Bowel sounds are normal. There is no distension.     Palpations: Abdomen is soft.     Tenderness: There is no abdominal tenderness. There is no guarding or rebound.  Genitourinary:    Labia:        Right: No rash.        Left: No rash.      Vagina: Normal.     Cervix: Normal.     Uterus: Normal.      Adnexa: Right adnexa normal and left adnexa normal.  Musculoskeletal:     Cervical back: Neck supple.     Right lower leg: No edema.     Left lower leg: No edema.  Lymphadenopathy:     Cervical: No cervical adenopathy.  Skin:    Coloration: Skin is not pale.     Findings: No erythema or rash.  Neurological:     Mental Status: She is alert and oriented to person, place, and time.     Cranial Nerves: No cranial nerve deficit.     Motor: No abnormal muscle tone.     Coordination: Coordination normal.  Psychiatric:        Behavior: Behavior normal.        Thought Content: Thought content normal.        Judgment: Judgment normal.           Assessment & Plan:  Other specified hypothyroidism - Plan: TSH  Elevated blood sugar - Plan: Hemoglobin A1c, CBC with Differential/Platelet, COMPLETE METABOLIC PANEL WITH GFR, TSH  Bipolar I disorder ([place]) Patient appears very flat and depressed today.  She has anhedonia.  She is not wanting to  get out of bed.  She is not wanting to perform her activities of daily living as evidenced by the fact she is primarily eating junk food because she does not want to even prepare meals.  I recommended stopping Lexapro and replacing with Trintellix 10 mg daily and then rechecking in 4 weeks to see if there is improvement in the symptoms of depression.  While the patient is here to get a I will monitor her TSH regarding the treatment of her hypothyroidism.  I will also check an A1c given her recent elevated blood sugars to see if she is developing type 2 diabetes.  Recommended a low carbohydrate healthy diet of fruits and vegetables with increasing exercise and activity.  I did refill her lorazepam as she is out of this medication

## 2022-05-06 ENCOUNTER — Telehealth: Payer: Self-pay

## 2022-05-06 LAB — CBC WITH DIFFERENTIAL/PLATELET
Absolute Monocytes: 580 cells/uL (ref 200–950)
Basophils Absolute: 48 cells/uL (ref 0–200)
Basophils Relative: 0.7 %
Eosinophils Absolute: 48 cells/uL (ref 15–500)
Eosinophils Relative: 0.7 %
HCT: 38.1 % (ref 35.0–45.0)
Hemoglobin: 11.4 g/dL — ABNORMAL LOW (ref 11.7–15.5)
Lymphs Abs: 1007 cells/uL (ref 850–3900)
MCH: 23.8 pg — ABNORMAL LOW (ref 27.0–33.0)
MCHC: 29.9 g/dL — ABNORMAL LOW (ref 32.0–36.0)
MCV: 79.4 fL — ABNORMAL LOW (ref 80.0–100.0)
MPV: 11.9 fL (ref 7.5–12.5)
Monocytes Relative: 8.4 %
Neutro Abs: 5216 cells/uL (ref 1500–7800)
Neutrophils Relative %: 75.6 %
Platelets: 237 10*3/uL (ref 140–400)
RBC: 4.8 10*6/uL (ref 3.80–5.10)
RDW: 15.2 % — ABNORMAL HIGH (ref 11.0–15.0)
Total Lymphocyte: 14.6 %
WBC: 6.9 10*3/uL (ref 3.8–10.8)

## 2022-05-06 LAB — COMPLETE METABOLIC PANEL WITH GFR
AG Ratio: 1.8 (calc) (ref 1.0–2.5)
ALT: 8 U/L (ref 6–29)
AST: 15 U/L (ref 10–35)
Albumin: 3.8 g/dL (ref 3.6–5.1)
Alkaline phosphatase (APISO): 105 U/L (ref 37–153)
BUN: 9 mg/dL (ref 7–25)
CO2: 27 mmol/L (ref 20–32)
Calcium: 8.9 mg/dL (ref 8.6–10.4)
Chloride: 106 mmol/L (ref 98–110)
Creat: 0.95 mg/dL (ref 0.50–1.05)
Globulin: 2.1 g/dL (calc) (ref 1.9–3.7)
Glucose, Bld: 93 mg/dL (ref 65–99)
Potassium: 4.3 mmol/L (ref 3.5–5.3)
Sodium: 143 mmol/L (ref 135–146)
Total Bilirubin: 0.3 mg/dL (ref 0.2–1.2)
Total Protein: 5.9 g/dL — ABNORMAL LOW (ref 6.1–8.1)
eGFR: 69 mL/min/{1.73_m2} (ref >=60)

## 2022-05-06 LAB — HEMOGLOBIN A1C
Hgb A1c MFr Bld: 5.7 % of total Hgb — ABNORMAL HIGH (ref <5.7)
Mean Plasma Glucose: 117 mg/dL
eAG (mmol/L): 6.5 mmol/L

## 2022-05-06 LAB — TSH: TSH: 2.73 mIU/L (ref 0.40–4.50)

## 2022-05-06 NOTE — Telephone Encounter (Signed)
I have attempted without success to contact this patient by phone to discuss lab results and will try again later.

## 2022-05-06 NOTE — Telephone Encounter (Signed)
-----   Message from Susy Frizzle, MD sent at 05/06/2022  6:50 AM EDT ----- Labs look good except mild anemia.  Please check stool for blood and check iron level.

## 2022-05-09 ENCOUNTER — Other Ambulatory Visit: Payer: Self-pay | Admitting: Neurology

## 2022-05-12 ENCOUNTER — Telehealth: Payer: Self-pay | Admitting: Neurology

## 2022-05-12 MED ORDER — LEVETIRACETAM 1000 MG PO TABS: ORAL_TABLET | ORAL | 0 refills | Status: DC

## 2022-05-12 NOTE — Telephone Encounter (Signed)
Pt called needing her levETIRAcetam (KEPPRA) 1000 MG tablet called in to the [facility]. Pt has r/s her yearly f/u but was informed that this appt can not be cx/missed if she is to cont. to get her medication filled.

## 2022-05-12 NOTE — Telephone Encounter (Signed)
Pt called, pt aware of lab results. Voiced understanding.   Pt coming in on 05/15/22 for blood draw to check FE+ and stool card due to anemia per Dr. 's order

## 2022-05-12 NOTE — Telephone Encounter (Signed)
Pending appt 05/20/22 at 1:45pm (check-in time 1:15pm). One refill sent to pharmacy. Spoke to patient and she is aware this appt must be kept for further refills.

## 2022-05-15 ENCOUNTER — Other Ambulatory Visit: Payer: Medicaid Other

## 2022-05-15 DIAGNOSIS — K529 Noninfective gastroenteritis and colitis, unspecified: Secondary | ICD-10-CM

## 2022-05-16 LAB — IRON: Iron: 17 ug/dL — ABNORMAL LOW (ref 45–160)

## 2022-05-20 ENCOUNTER — Ambulatory Visit: Payer: Medicaid Other | Admitting: Neurology

## 2022-05-29 ENCOUNTER — Telehealth: Payer: Self-pay

## 2022-05-29 NOTE — Telephone Encounter (Signed)
PA send to plan on 05/21/22

## 2022-05-30 ENCOUNTER — Other Ambulatory Visit: Payer: Self-pay | Admitting: Family Medicine

## 2022-05-30 NOTE — Telephone Encounter (Signed)
Requested Prescriptions  Pending Prescriptions Disp Refills  . escitalopram (LEXAPRO) 20 MG tablet [Pharmacy Med Name: ESCITALOPRAM '20MG'$  TABLETS] 30 tablet 0    Sig: TAKE (1) TABLET BY MOUTH ONCE DAILY.     Psychiatry:  Antidepressants - SSRI Failed - 05/30/2022  3:56 PM      Failed - Valid encounter within last 6 months    Recent Outpatient Visits          10 months ago Other specified hypothyroidism   [place] Pickard, Cammie Mcgee, MD   1 year ago Bipolar I disorder [facility])   [place] Pickard, Cammie Mcgee, MD   2 years ago Other specified hypothyroidism   [place] Susy Frizzle, MD   3 years ago Chest pain due to gastrointestinal reflux disease   [place] Susy Frizzle, MD   3 years ago Hypothyroidism, unspecified type   [place], Cammie Mcgee, MD      Future Appointments            In 6 days Pickard, Cammie Mcgee, MD [place], [facility]           Passed - Completed PHQ-2 or PHQ-9 in the last 360 days      \

## 2022-06-03 ENCOUNTER — Other Ambulatory Visit: Payer: Self-pay | Admitting: Family Medicine

## 2022-06-04 ENCOUNTER — Other Ambulatory Visit: Payer: Self-pay

## 2022-06-04 NOTE — Telephone Encounter (Signed)
LOV 05/05/22 Last refill 05/05/22, #60, 0 refills  Please review, thanks!

## 2022-06-04 NOTE — Telephone Encounter (Signed)
Requested medication (s) are due for refill today: NO, different signature than current rx  Requested medication (s) are on the active medication list: yes, scheduled, not prn  Last refill:  05/05/22 #60 with 0 RF  Future visit scheduled: tomorrow  Notes to clinic:  This rx is for prn, new rx is for scheduled. This medication can not be delegated, please assess.        Requested Prescriptions  Pending Prescriptions Disp Refills   LORazepam (ATIVAN) 1 MG tablet [Pharmacy Med Name: LORAZEPAM 1 MG TABLET] 60 tablet 0    Sig: TAKE (1) TABLET BY MOUTH TWICE DAILY AS NEEDED FOR ANXIETY.     Not Delegated - Psychiatry: Anxiolytics/Hypnotics 2 Failed - 06/03/2022  8:52 AM      Failed - This refill cannot be delegated      Failed - Urine Drug Screen completed in last 360 days      Failed - Valid encounter within last 6 months    Recent Outpatient Visits           10 months ago Other specified hypothyroidism   [place] Pickard, Cammie Mcgee, MD   1 year ago Bipolar I disorder [facility])   [place] Pickard, Cammie Mcgee, MD   2 years ago Other specified hypothyroidism   [place] Susy Frizzle, MD   3 years ago Chest pain due to gastrointestinal reflux disease   [place] Susy Frizzle, MD   4 years ago Hypothyroidism, unspecified type   [place] Pickard, Cammie Mcgee, MD       Future Appointments             Tomorrow Pickard, Cammie Mcgee, MD [place], [place] - Patient is not pregnant

## 2022-06-05 ENCOUNTER — Ambulatory Visit (INDEPENDENT_AMBULATORY_CARE_PROVIDER_SITE_OTHER): Payer: Medicaid Other | Admitting: Family Medicine

## 2022-06-05 VITALS — BP 122/82 | HR 88 | Temp 98.7°F | Ht 66.0 in | Wt 193.0 lb

## 2022-06-05 DIAGNOSIS — F319 Bipolar disorder, unspecified: Secondary | ICD-10-CM | POA: Diagnosis not present

## 2022-06-05 MED ORDER — LORAZEPAM 1 MG PO TABS: 1.0000 mg | ORAL_TABLET | Freq: Two times a day (BID) | ORAL | 0 refills | Status: DC

## 2022-06-05 MED ORDER — ESCITALOPRAM OXALATE 10 MG PO TABS: 10.0000 mg | ORAL_TABLET | Freq: Every day | ORAL | 5 refills | Status: DC

## 2022-06-05 NOTE — Progress Notes (Signed)
Subjective:    Patient ID: Sheryl Hall, female    DOB: 06-Dec-1960, 61 y.o.   MRN: [id number] 05/05/22 Patient is a very pleasant 61 year old Caucasian female who presents today to discuss her medications.  She has a history of bipolar disorder.  She is currently on Lexapro, Trileptal as a mood stabilizer, and Zyprexa as well.  She has been on this combination for quite some time.  She takes Keppra for seizure prevention.  She is here today with her friend.  Her friend states that the patient wants to sleep all day long.  The patient states she does not want to get out of bed.  She reports feeling sad.  She reports feeling anhedonia.  She will then stay up all night because she is restless.  She denies any manic symptoms.  She denies racing thoughts or impulsive behavior.  She denies suicidal thoughts.  Recently her friends have been checking her blood sugar and random blood sugars have been around 160.  She is eating poorly primarily Cheetos and junk food and sodas because she does not want to even cook or eat healthy.  At that time, my plan was:  Patient appears very flat and depressed today.  She has anhedonia.  She is not wanting to get out of bed.  She is not wanting to perform her activities of daily living as evidenced by the fact she is primarily eating junk food because she does not want to even prepare meals.  I recommended stopping Lexapro and replacing with Trintellix 10 mg daily and then rechecking in 4 weeks to see if there is improvement in the symptoms of depression.  While the patient is here to get a I will monitor her TSH regarding the treatment of her hypothyroidism.  I will also check an A1c given her recent elevated blood sugars to see if she is developing type 2 diabetes.  Recommended a low carbohydrate healthy diet of fruits and vegetables with increasing exercise and activity.  I did refill her lorazepam as she is out of this medication  06/05/22 Patient states that she took the  Trintellix for 3 weeks but had to discontinue the medication due to nausea.  She was also having suicidal thoughts.  Around the same time, her father passed away.  She states that she was very close to her father.  She states the medication kept her from feeling any sadness including for pain.  She states that she feels not like she is still in shock.  Unfortunately she lays in bed all day long often not getting up until 4 or 5:00.  She is not doing any physical activity.  Specifically she is not working and exercising.  She is not performing house work.   She is extremely sedentary Past Medical History:  Diagnosis Date   Anxiety    Arthritis    Avascular necrosis of hip ([place])    left   Bipolar 1 disorder (HCC)    Chronic left hip pain    Chronic nausea    Depression    Diverticulosis of colon    Family hx of colon cancer    age 19-father   Folate deficiency    GERD (gastroesophageal reflux disease)    Headache    Heart murmur    Hx of abnormal Pap smear    Hyperlipidemia    Hyperthyroidism    Keratosis, actinic    Low back pain 03/01/2013   MRI with multiple levels of disc  bulge   Panic attacks    Polysubstance abuse (HCC)    PTSD (post-traumatic stress disorder)    S/P colonoscopy 05/30/2010   LAX sphincter tone, anal papilla, left-sided diverticulosis, normal random biopsies,, 1 polyp-TA   S/P endoscopy 05/30/2010   Dr Rourk-> non--critical Schatzki's ring, s/p 81F dilation   Seizures ([place])    Stroke [facility]) 2014   Right parietal, no deficits    Tubular adenoma of colon 05/30/2010   Next colonoscopy 05/2015   UTI (urinary tract infection)    Past Surgical History:  Procedure Laterality Date   COLONOSCOPY     every 5 years   COLONOSCOPY  05/30/2010   RMR:lax anal sphincter tone,anal papilla,otherwise normal/left-sided diverticula   ESOPHAGOGASTRODUODENOSCOPY  05/30/2010   BHA:[id number] ring/small HH otherwise normal   EXTERNAL EAR SURGERY Left 12 years ago   skin graft from  behind ear put in ear canal   JOINT REPLACEMENT     NECK SURGERY  10 years ago   SKIN CANCER DESTRUCTION     TOTAL HIP ARTHROPLASTY Left 04/19/2014   Procedure: LEFT TOTAL HIP ARTHROPLASTY ANTERIOR APPROACH;  Surgeon: [name], MD;  [facility]: [facility];  Service: Orthopedics;  Laterality: Left;   TOTAL HIP ARTHROPLASTY Right 05/23/2015   Procedure: RIGHT TOTAL HIP ARTHROPLASTY ANTERIOR APPROACH;  Surgeon: Gaynelle Arabian, MD;  Location: [facility];  Service: Orthopedics;  Laterality: Right;   Current Outpatient Medications on File Prior to Visit  Medication Sig Dispense Refill   atorvastatin (LIPITOR) 40 MG tablet TAKE (1) TABLET BY MOUTH ONCE DAILY. 90 tablet 0   escitalopram (LEXAPRO) 20 MG tablet TAKE (1) TABLET BY MOUTH ONCE DAILY. 30 tablet 0   ibuprofen (ADVIL) 600 MG tablet Take 1 tablet (600 mg total) by mouth every 6 (six) hours as needed. 20 tablet 0   levETIRAcetam (KEPPRA) 1000 MG tablet Take 0.5 tablet in morning and 1.5 tablet in evening. 60 tablet 0   levothyroxine (SYNTHROID) 50 MCG tablet Take 1 tablet (50 mcg total) by mouth daily. 90 tablet 3   LORazepam (ATIVAN) 1 MG tablet Take 1 tablet (1 mg total) by mouth 2 (two) times daily. 60 tablet 0   OLANZapine (ZYPREXA) 10 MG tablet TAKE (1) TABLET BY MOUTH AT BEDTIME. 30 tablet 2   omeprazole (PRILOSEC) 40 MG capsule TAKE (1) CAPSULE BY MOUTH TWICE DAILY. 180 capsule 3   Oxcarbazepine (TRILEPTAL) 300 MG tablet TAKE (1) TABLET BY MOUTH TWICE DAILY. 180 tablet 0   valACYclovir (VALTREX) 500 MG tablet TAKE ONE TABLET BY MOUTH ONCE DAILY. 30 tablet 0   vortioxetine HBr (TRINTELLIX) 10 MG TABS tablet Take 1 tablet (10 mg total) by mouth daily. (Patient not taking: Reported on 06/05/2022) 30 tablet 3   [DISCONTINUED] LINZESS 72 MCG capsule TAKE ONE CAPSULE ORALLY EVERY MORNING BEFORE BREAKFAST. (Patient taking differently: Take 72 mcg by mouth daily before breakfast. ) 30 capsule 1   [DISCONTINUED] traZODone (DESYREL) 100 MG tablet TAKE  (1) TABLET BY MOUTH AT BEDTIME. 30 tablet 0   No current facility-administered medications on file prior to visit.   No Known Allergies Social History   Socioeconomic History   Marital status: Divorced    Spouse name: Not on file   Number of children: 1   Years of education: Not on file   Highest education level: Not on file  Occupational History   Occupation: disabled  Tobacco Use   Smoking status: Every Day    Packs/day: 0.50    Years: 30.00  Total pack years: 15.00    Types: Cigarettes   Smokeless tobacco: Never   Tobacco comments:    trying to quit  Vaping Use   Vaping Use: Never used  Substance and Sexual Activity   Alcohol use: No    Alcohol/week: 0.0 standard drinks of alcohol   Drug use: No    Comment: hx cocaine abuse   Sexual activity: Never    Birth control/protection: None  Other Topics Concern   Not on file  Social History Narrative   Patient lives at home alone and she is single.    Disabled.   Education college education.   Right handed.   Caffeine mountain dew four daily.    Social Determinants of Health   Financial Resource Strain: Not on file  Food Insecurity: Not on file  Transportation Needs: Not on file  Physical Activity: Not on file  Stress: Not on file  Social Connections: Not on file  Intimate Partner Violence: Not on file     Review of Systems  All other systems reviewed and are negative.      Objective:   Physical Exam Vitals reviewed.  Constitutional:      General: She is not in acute distress.    Appearance: She is well-developed. She is not diaphoretic.  HENT:     Right Ear: Tympanic membrane, ear canal and external ear normal.     Left Ear: Tympanic membrane, ear canal and external ear normal.     Nose: Nose normal.     Mouth/Throat:     Pharynx: No oropharyngeal exudate.  Eyes:     Extraocular Movements: Extraocular movements intact.     Pupils: Pupils are equal, round, and reactive to light.  Neck:      Thyroid: No thyromegaly.     Vascular: No JVD.  Cardiovascular:     Rate and Rhythm: Normal rate and regular rhythm.     Heart sounds: Normal heart sounds. No murmur heard. Pulmonary:     Effort: Pulmonary effort is normal. No respiratory distress.     Breath sounds: Normal breath sounds. No stridor. No wheezing, rhonchi or rales.  Abdominal:     General: Bowel sounds are normal. There is no distension.     Palpations: Abdomen is soft.     Tenderness: There is no abdominal tenderness. There is no guarding or rebound.  Genitourinary:    Labia:        Right: No rash.        Left: No rash.      Vagina: Normal.     Cervix: Normal.     Uterus: Normal.      Adnexa: Right adnexa normal and left adnexa normal.  Musculoskeletal:     Cervical back: Neck supple.     Right lower leg: No edema.     Left lower leg: No edema.  Lymphadenopathy:     Cervical: No cervical adenopathy.  Skin:    Coloration: Skin is not pale.     Findings: No erythema or rash.  Neurological:     Mental Status: She is alert and oriented to person, place, and time.     Cranial Nerves: No cranial nerve deficit.     Motor: No abnormal muscle tone.     Coordination: Coordination normal.  Psychiatric:        Behavior: Behavior normal.        Thought Content: Thought content normal.        Judgment: Judgment normal.  Assessment & Plan:  Bipolar depression ([place]) At this point we will resume Lexapro but increase to 30 mg a day.  She has been taking 20 mg a day for years and she feels like it was just a little bit stronger to make it better.  I cautioned the patient about possible serotonin syndrome.  However also had a long discussion today with patient regarding things that she needs to do to try to get herself better.  First she needs to set an alarm to get out of bed every morning regardless of whether she feels like it or not.  Second she needs to find something to do every day to give herself meaning  whether the visiting a friend, performing housework, volunteering, exercising, etc.  However she needs to perform some physical activity every day to give herself motivation and something that the feels a sense of completion and purpose.  I would also like to consult psychiatry and arrange counseling for the patient

## 2022-06-06 ENCOUNTER — Other Ambulatory Visit: Payer: Self-pay

## 2022-06-06 DIAGNOSIS — D126 Benign neoplasm of colon, unspecified: Secondary | ICD-10-CM

## 2022-06-06 DIAGNOSIS — K581 Irritable bowel syndrome with constipation: Secondary | ICD-10-CM

## 2022-06-07 LAB — FECAL GLOBIN BY IMMUNOCHEMISTRY
FECAL GLOBIN RESULT:: NOT DETECTED
MICRO NUMBER:: 13648722
SPECIMEN QUALITY:: ADEQUATE

## 2022-06-10 ENCOUNTER — Telehealth: Payer: Self-pay | Admitting: Neurology

## 2022-06-10 ENCOUNTER — Other Ambulatory Visit: Payer: Self-pay | Admitting: Neurology

## 2022-06-10 NOTE — Telephone Encounter (Signed)
Pt inquiry about refill for levETIRAcetam (KEPPRA) 1000 MG tablet.

## 2022-06-11 ENCOUNTER — Ambulatory Visit (INDEPENDENT_AMBULATORY_CARE_PROVIDER_SITE_OTHER): Payer: Medicaid Other | Admitting: Neurology

## 2022-06-11 DIAGNOSIS — G40209 Localization-related (focal) (partial) symptomatic epilepsy and epileptic syndromes with complex partial seizures, not intractable, without status epilepticus: Secondary | ICD-10-CM | POA: Diagnosis not present

## 2022-06-11 MED ORDER — LEVETIRACETAM 1000 MG PO TABS
ORAL_TABLET | ORAL | 3 refills | Status: DC
Start: 2022-06-11 — End: 2023-06-05

## 2022-06-11 NOTE — Telephone Encounter (Signed)
Called the patient to advise that since it has been 02/2021 since she has been seen, as it was discussed with her previously she needed to keep the upcoming apt to continue with medication refills.  I offered a in office visit and the pt just lost her dad and is working through arrangements. She was unable to do mychart visit due to not knowing how and not having set up. Sarah agreed to complete a telephone visit with the patient today at 11:15 am. Pt was made aware she should stay by her phone at that time and Judson Roch will call. Pt verbalized understanding and was appreciative.  She has an apt in august still scheduled and I will await to make sure Judson Roch doesn't feel that is still needed

## 2022-06-11 NOTE — Progress Notes (Signed)
   Virtual Visit via Telephone Note  I connected with Sheryl Hall on 06/11/22 at 11:15 AM EDT by telephone and verified that I am speaking with the correct person using two identifiers.   I discussed the limitations, risks, security and privacy concerns of performing an evaluation and management service by telephone and the availability of in person appointments. I also discussed with the patient that there may be a patient responsible charge related to this service. The patient expressed understanding and agreed to proceed.  Patient location: her home Provider location: in the office  History of Present Illness: 06/11/2022 SS: Sheryl Hall is here today for follow up via telephone visit.  Needs a refill on Keppra. Her father passed recently. Her last seizure was about 3 years ago, she doesn't remember the details. Describes her seizures as zoning out, staring off for a few seconds. She is aware they are happening. Had arm fracture in January, no other health issues.   03/11/2021 SS: Sheryl Hall is a 61 year old female with history of seizures.  She has not been seen since October 2019.  She remains on Keppra.  Denies any recent seizures.  She lives alone, drives a car.  Indicates no changes to her overall health.  She has no problems today.  Here today for evaluation unaccompanied.   Observations/Objective: Via telephone visit, is alert and oriented, good historian, speech is clear and concise  Assessment and Plan: 1.  Seizures -Continues to do well, no recurrent seizures -I will refill Keppra -Call for any seizure activity, otherwise follow-up in 1 year  Meds ordered this encounter  Medications   levETIRAcetam (KEPPRA) 1000 MG tablet    Sig: Take 0.5 tablet in morning and 1.5 tablet in evening.    Dispense:  180 tablet    Refill:  3    Follow Up Instructions: 06/16/23 3:15   I discussed the assessment and treatment plan with the patient. The patient was provided an opportunity to ask  questions and all were answered. The patient agreed with the plan and demonstrated an understanding of the instructions.   The patient was advised to call back or seek an in-person evaluation if the symptoms worsen or if the condition fails to improve as anticipated.  I spent 11 minutes on this telephone call.  Evangeline Dakin, DNP  [facility] [address] [place], [place] [address] [phone]

## 2022-06-17 ENCOUNTER — Ambulatory Visit: Payer: Medicaid Other | Admitting: Neurology

## 2022-06-19 ENCOUNTER — Ambulatory Visit: Payer: Medicaid Other | Admitting: Orthopedic Surgery

## 2022-06-20 ENCOUNTER — Emergency Department (HOSPITAL_COMMUNITY)
Admission: EM | Admit: 2022-06-20 | Discharge: 2022-06-21 | Disposition: A | Discharge status: Other Acute Inpatient Hospital | Payer: Medicaid Other | Attending: Emergency Medicine | Admitting: Emergency Medicine

## 2022-06-20 ENCOUNTER — Other Ambulatory Visit: Payer: Self-pay

## 2022-06-20 ENCOUNTER — Encounter (HOSPITAL_COMMUNITY): Payer: Self-pay | Admitting: Emergency Medicine

## 2022-06-20 DIAGNOSIS — R7309 Other abnormal glucose: Secondary | ICD-10-CM | POA: Insufficient documentation

## 2022-06-20 DIAGNOSIS — T424X2A Poisoning by benzodiazepines, intentional self-harm, initial encounter: Secondary | ICD-10-CM | POA: Insufficient documentation

## 2022-06-20 DIAGNOSIS — T1491XA Suicide attempt, initial encounter: Secondary | ICD-10-CM

## 2022-06-20 DIAGNOSIS — F329 Major depressive disorder, single episode, unspecified: Secondary | ICD-10-CM | POA: Insufficient documentation

## 2022-06-20 DIAGNOSIS — T50902A Poisoning by unspecified drugs, medicaments and biological substances, intentional self-harm, initial encounter: Secondary | ICD-10-CM

## 2022-06-20 LAB — RAPID URINE DRUG SCREEN, HOSP PERFORMED
Amphetamines: NOT DETECTED
Barbiturates: NOT DETECTED
Benzodiazepines: POSITIVE — AB
Cocaine: POSITIVE — AB
Opiates: NOT DETECTED
Tetrahydrocannabinol: NOT DETECTED

## 2022-06-20 LAB — COMPREHENSIVE METABOLIC PANEL
ALT: 9 U/L (ref 0–44)
AST: 15 U/L (ref 15–41)
Albumin: 3.3 g/dL — ABNORMAL LOW (ref 3.5–5.0)
Alkaline Phosphatase: 101 U/L (ref 38–126)
Anion gap: 6 (ref 5–15)
BUN: 6 mg/dL (ref 6–20)
CO2: 27 mmol/L (ref 22–32)
Calcium: 8.3 mg/dL — ABNORMAL LOW (ref 8.9–10.3)
Chloride: 108 mmol/L (ref 98–111)
Creatinine, Ser: 0.94 mg/dL (ref 0.44–1.00)
GFR, Estimated: >60 mL/min (ref >60)
Glucose, Bld: 100 mg/dL — ABNORMAL HIGH (ref 70–99)
Potassium: 4.1 mmol/L (ref 3.5–5.1)
Sodium: 141 mmol/L (ref 135–145)
Total Bilirubin: 0.4 mg/dL (ref 0.3–1.2)
Total Protein: 6 g/dL — ABNORMAL LOW (ref 6.5–8.1)

## 2022-06-20 LAB — CBC
HCT: 38.7 % (ref 36.0–46.0)
Hemoglobin: 11.5 g/dL — ABNORMAL LOW (ref 12.0–15.0)
MCH: 24 pg — ABNORMAL LOW (ref 26.0–34.0)
MCHC: 29.7 g/dL — ABNORMAL LOW (ref 30.0–36.0)
MCV: 80.8 fL (ref 80.0–100.0)
Platelets: 197 10*3/uL (ref 150–400)
RBC: 4.79 MIL/uL (ref 3.87–5.11)
RDW: 16.4 % — ABNORMAL HIGH (ref 11.5–15.5)
WBC: 5.4 10*3/uL (ref 4.0–10.5)
nRBC: 0 % (ref 0.0–0.2)

## 2022-06-20 LAB — ACETAMINOPHEN LEVEL: Acetaminophen (Tylenol), Serum: <10 ug/mL — ABNORMAL LOW (ref 10–30)

## 2022-06-20 LAB — SALICYLATE LEVEL: Salicylate Lvl: <7 mg/dL — ABNORMAL LOW (ref 7.0–30.0)

## 2022-06-20 LAB — CBG MONITORING, ED: Glucose-Capillary: 109 mg/dL — ABNORMAL HIGH (ref 70–99)

## 2022-06-20 LAB — ETHANOL: Alcohol, Ethyl (B): <10 mg/dL (ref <10)

## 2022-06-20 MED ORDER — ESCITALOPRAM OXALATE 10 MG PO TABS
10.0000 mg | ORAL_TABLET | Freq: Every day | ORAL | Status: DC
  Administered 2022-06-21: 10 mg via ORAL
  Filled 2022-06-20: qty 1

## 2022-06-20 NOTE — ED Provider Notes (Signed)
[facility] EMERGENCY DEPARTMENT Provider Note   CSN: [id number] Arrival date & time: 06/20/22  2101     History {Add pertinent medical, surgical, social history, OB history to HPI:1} Chief Complaint  Patient presents with   Drug Overdose    Sheryl Hall is a 61 y.o. female.  Patient took about 10 Ativan and attempt to kill herself.  Patient has history of previous suicide attempt.  Patient has a history of hyperlipidemia and bipolar  The history is provided by the patient and medical records. No language interpreter was used.  Drug Overdose This is a new problem. The current episode started 12 to 24 hours ago. The problem occurs constantly. The problem has not changed since onset.Pertinent negatives include no chest pain, no abdominal pain and no headaches. Nothing aggravates the symptoms. Nothing relieves the symptoms.       Home Medications Prior to Admission medications   Medication Sig Start Date End Date Taking? Authorizing Provider  atorvastatin (LIPITOR) 40 MG tablet TAKE (1) TABLET BY MOUTH ONCE DAILY. 09/05/21   Susy Frizzle, MD  escitalopram (LEXAPRO) 10 MG tablet Take 1 tablet (10 mg total) by mouth daily. Take with 20 mg a day 06/05/22   Susy Frizzle, MD  escitalopram (LEXAPRO) 20 MG tablet TAKE (1) TABLET BY MOUTH ONCE DAILY. 05/30/22   Susy Frizzle, MD  ibuprofen (ADVIL) 600 MG tablet Take 1 tablet (600 mg total) by mouth every 6 (six) hours as needed. 01/02/22   Carole Civil, MD  levETIRAcetam (KEPPRA) 1000 MG tablet Take 0.5 tablet in morning and 1.5 tablet in evening. 06/11/22   Suzzanne Cloud, NP  levothyroxine (SYNTHROID) 50 MCG tablet Take 1 tablet (50 mcg total) by mouth daily. 07/10/21   Susy Frizzle, MD  LORazepam (ATIVAN) 1 MG tablet Take 1 tablet (1 mg total) by mouth 2 (two) times daily. 06/05/22   Susy Frizzle, MD  OLANZapine (ZYPREXA) 10 MG tablet TAKE (1) TABLET BY MOUTH AT BEDTIME. 03/14/22   Susy Frizzle, MD   omeprazole (PRILOSEC) 40 MG capsule TAKE (1) CAPSULE BY MOUTH TWICE DAILY. 11/20/21   Susy Frizzle, MD  Oxcarbazepine (TRILEPTAL) 300 MG tablet TAKE (1) TABLET BY MOUTH TWICE DAILY. 04/22/22   Susy Frizzle, MD  valACYclovir (VALTREX) 500 MG tablet TAKE ONE TABLET BY MOUTH ONCE DAILY. 09/05/21   Susy Frizzle, MD  vortioxetine HBr (TRINTELLIX) 10 MG TABS tablet Take 1 tablet (10 mg total) by mouth daily. Patient not taking: Reported on 06/05/2022 05/05/22   Susy Frizzle, MD  LINZESS 72 MCG capsule TAKE ONE CAPSULE ORALLY EVERY MORNING BEFORE BREAKFAST. Patient taking differently: Take 72 mcg by mouth daily before breakfast.  06/08/19 02/29/20  Carlis Stable, NP  traZODone (DESYREL) 100 MG tablet TAKE (1) TABLET BY MOUTH AT BEDTIME. 08/11/19 02/29/20  Susy Frizzle, MD      Allergies    Patient has no known allergies.    Review of Systems   Review of Systems  Constitutional:  Negative for appetite change and fatigue.  HENT:  Negative for congestion, ear discharge and sinus pressure.   Eyes:  Negative for discharge.  Respiratory:  Negative for cough.   Cardiovascular:  Negative for chest pain.  Gastrointestinal:  Negative for abdominal pain and diarrhea.  Genitourinary:  Negative for frequency and hematuria.  Musculoskeletal:  Negative for back pain.  Skin:  Negative for rash.  Neurological:  Negative for seizures and headaches.  Psychiatric/Behavioral:  Negative for hallucinations.        Depressed and suicidal    Physical Exam Updated Vital Signs BP 127/69 (BP Location: Left Arm)   Pulse 83   Temp 98.5 F (36.9 C) (Oral)   Resp (!) 24   Ht '5\' 6"'$  (1.676 m)   Wt 87.5 kg   SpO2 93%   BMI 31.15 kg/m  Physical Exam Vitals and nursing note reviewed.  Constitutional:      Appearance: She is well-developed.  HENT:     Head: Normocephalic.     Nose: Nose normal.  Eyes:     General: No scleral icterus.    Conjunctiva/sclera: Conjunctivae normal.  Neck:      Thyroid: No thyromegaly.  Cardiovascular:     Rate and Rhythm: Normal rate and regular rhythm.     Heart sounds: No murmur heard.    No friction rub. No gallop.  Pulmonary:     Breath sounds: No stridor. No wheezing or rales.  Chest:     Chest wall: No tenderness.  Abdominal:     General: There is no distension.     Tenderness: There is no abdominal tenderness. There is no rebound.  Musculoskeletal:        General: Normal range of motion.     Cervical back: Neck supple.  Lymphadenopathy:     Cervical: No cervical adenopathy.  Skin:    Findings: No erythema or rash.  Neurological:     Mental Status: She is alert and oriented to person, place, and time.     Motor: No abnormal muscle tone.     Coordination: Coordination normal.  Psychiatric:        Behavior: Behavior normal.     Comments: Patient is depressed and suicidal     ED Results / Procedures / Treatments   Labs (all labs ordered are listed, but only abnormal results are displayed) Labs Reviewed  COMPREHENSIVE METABOLIC PANEL - Abnormal; Notable for the following components:      Result Value   Glucose, Bld 100 (*)    Calcium 8.3 (*)    Total Protein 6.0 (*)    Albumin 3.3 (*)    All other components within normal limits  SALICYLATE LEVEL - Abnormal; Notable for the following components:   Salicylate Lvl <4.5 (*)    All other components within normal limits  ACETAMINOPHEN LEVEL - Abnormal; Notable for the following components:   Acetaminophen (Tylenol), Serum <10 (*)    All other components within normal limits  CBC - Abnormal; Notable for the following components:   Hemoglobin 11.5 (*)    MCH 24.0 (*)    MCHC 29.7 (*)    RDW 16.4 (*)    All other components within normal limits  CBG MONITORING, ED - Abnormal; Notable for the following components:   Glucose-Capillary 109 (*)    All other components within normal limits  ETHANOL  RAPID URINE DRUG SCREEN, HOSP PERFORMED    EKG None  Radiology No  results found.  Procedures Procedures  {Document cardiac monitor, telemetry assessment procedure when appropriate:1}  Medications Ordered in ED Medications - No data to display  ED Course/ Medical Decision Making/ A&P Patient with Ativan overdose.  She is medically cleared and can be taken care of by behavioral health                         Medical Decision Making Amount and/or Complexity of Data  Reviewed Labs: ordered.   Medication overdose for suicidal attempt.  {Document critical care time when appropriate:1} {Document review of labs and clinical decision tools ie heart score, Chads2Vasc2 etc:1}  {Document your independent review of radiology images, and any outside records:1} {Document your discussion with family members, caretakers, and with consultants:1} {Document social determinants of health affecting pt's care:1} {Document your decision making why or why not admission, treatments were needed:1} Final Clinical Impression(s) / ED Diagnoses Final diagnoses:  None    Rx / DC Orders ED Discharge Orders     None

## 2022-06-20 NOTE — ED Triage Notes (Signed)
Pt BIB [facility] for overdose of approx 10 - '1mg'$  lorazepam at about 1630 this afternoon. Pts bottle has 20 tabs left and was filled on 06/05/22.   Pt states she was trying to hurt herself, states she has a lot going on in her life right now.

## 2022-06-21 LAB — POC URINE PREG, ED: Preg Test, Ur: NEGATIVE

## 2022-06-21 NOTE — ED Notes (Signed)
Safe transport called to transport pt

## 2022-06-21 NOTE — BH Assessment (Signed)
Comprehensive Clinical Assessment (CCA) Note  06/21/2022 Sheryl Hall [id number]  Disposition: Sheryl Post, NP patient meets inpatient criteria. Disposition SW to secure placement. Sheryl Mola, RN informed of disposition.  The patient demonstrates the following risk factors for suicide: Chronic risk factors for suicide include: psychiatric disorder of depression, anxiety, ptsd and bipolar and previous suicide attempts 2 yrs ago attempted overdose . Acute risk factors for suicide include: unemployment and loss (financial, interpersonal, professional). Protective factors for this patient include: positive social support, coping skills, and hope for the future. Considering these factors, the overall suicide risk at this point appears to be high. Patient is not appropriate for outpatient follow up.  [place] ED from 06/20/2022 in [place] ED from 07/21/2019 in [place] High Risk High Risk      Sheryl Hall is a 61 year old female presenting voluntary to [facility] due to attempted overdose on approx 10 - '1mg'$  lorazepam at about 1630 this afternoon. Patient bottle has 20 pills left and was filled on 06/05/22. Patient denied HI, psychosis and alcohol/drug usage. Patient reported main stressor includes lack of money. Patient is currently unemployed and lives alone with a pet cat. Patient reported worsening depression and anxiety. Patient reported past psych hospitalization 2 years ago after attempted overdose on pills. Patient denied prior self-harming behaviors. Patient reported PCP prescribes current psych medications. Patient denied having a therapist or psychiatrist. Patient reported sleeping 12-14 hours nightly. Patient denied access to guns. Patient provided limited information. Patient was calm and cooperative during assessment.   Chief Complaint:  Chief Complaint  Patient presents with   Drug Overdose   Visit Diagnosis:  Major  depressive disorder   CCA Screening, Triage and Referral (STR)  Patient Reported Information How did you hear about [place]? Self  What Is the Reason for Your Visit/Call Today? SI with attempted overdose.  How Long Has This Been Causing You Problems? 1 wk - 1 month  What Do You Feel Would Help You the Most Today? Treatment for Depression or other mood problem   Have You Recently Had Any Thoughts About Hurting Yourself? Yes  Are You Planning to Commit Suicide/Harm Yourself At This time? No   Have you Recently Had Thoughts About [place]? No data recorded Are You Planning to Harm Someone at This Time? No  Explanation: No data recorded  Have You Used Any Alcohol or Drugs in the Past 24 Hours? No  How Long Ago Did You Use Drugs or Alcohol? No data recorded What Did You Use and How Much? No data recorded  Do You Currently Have a Therapist/Psychiatrist? No  Name of Therapist/Psychiatrist: No data recorded  Have You Been Recently Discharged From Any Office Practice or Programs? No  Explanation of Discharge From Practice/Program: No data recorded    CCA Screening Triage Referral Assessment Type of Contact: Tele-Assessment  Telemedicine Service Delivery:   Is this Initial or Reassessment? Initial Assessment  Date Telepsych consult ordered in [facility]:  06/20/22  Time Telepsych consult ordered in [facility]:  2301  Location of Assessment: [facility]  Provider Location: [facility] Assessment Services   Collateral Involvement: none reported   Does Patient Have a [place]? No data recorded Name and Contact of Legal Guardian: No data recorded If Minor and Not Living with Parent(s), Who has Custody? No data recorded Is CPS involved or ever been involved? No data recorded Is APS involved or ever been involved? No  data recorded  Patient Determined To Be At Risk for Harm To Self or Others Based on Review of Patient Reported Information or Presenting Complaint? No  data recorded Method: No data recorded Availability of Means: No data recorded Intent: No data recorded Notification Required: No data recorded Additional Information for Danger to Others Potential: No data recorded Additional Comments for Danger to Others Potential: No data recorded Are There Guns or Other Weapons in Your Home? No data recorded Types of Guns/Weapons: No data recorded Are These Weapons Safely Secured?                            No data recorded Who Could Verify You Are Able To Have These Secured: No data recorded Do You Have any Outstanding Charges, Pending Court Dates, Parole/Probation? No data recorded Contacted To Inform of Risk of Harm To Self or Others: No data recorded   Does Patient Present under Involuntary Commitment? No data recorded IVC Papers Initial File Date: No data recorded  [place] of Residence: [place]   Patient Currently Receiving the Following Services: Medication Management   Determination of Need: Emergent (2 hours)   Options For Referral: Inpatient Hospitalization; Medication Management; Outpatient Therapy     CCA Biopsychosocial Patient Reported Schizophrenia/Schizoaffective Diagnosis in Past: No data recorded  Strengths: uta   Mental Health Symptoms Depression:   Hopelessness; Worthlessness; Increase/decrease in appetite; Sleep (too much or little); Fatigue; Change in energy/activity; Tearfulness   Duration of Depressive symptoms:  Duration of Depressive Symptoms: Greater than two weeks   Mania:   None   Anxiety:    Worrying; Tension; Fatigue   Psychosis:   None   Duration of Psychotic symptoms:    Trauma:   Re-experience of traumatic event   Obsessions:   None   Compulsions:   None   Inattention:   None   Hyperactivity/Impulsivity:   None   Oppositional/Defiant Behaviors:   None   Emotional Irregularity:   None   Other Mood/Personality Symptoms:  No data recorded   Mental Status Exam Appearance  and self-care  Stature:   Average   Weight:   Average weight   Clothing:   Age-appropriate   Grooming:   Normal   Cosmetic use:   None   Posture/gait:   Normal   Motor activity:   Not Remarkable   Sensorium  Attention:   Normal   Concentration:   Normal   Orientation:   X5   Recall/memory:   Normal   Affect and Mood  Affect:   Depressed; Flat   Mood:   Depressed   Relating  Eye contact:   Normal   Facial expression:   Depressed; Sad   Attitude toward examiner:   Cooperative   Thought and Language  Speech flow:  Slow; Soft   Thought content:   Appropriate to Mood and Circumstances   Preoccupation:   None   Hallucinations:   None   Organization:  No data recorded  [employer] of Knowledge:   Average   Intelligence:   Average   Abstraction:   Normal   Judgement:   Poor   Reality Testing:   Adequate   Insight:   Lacking   Decision Making:   Impulsive   Social Functioning  Social Maturity:   Impulsive   Social Judgement:   Naive   Stress  Stressors:   Teacher, music Ability:   Overwhelmed; Exhausted  Skill Deficits:   Decision making; Self-control; Communication; Self-care   Supports:   Family; Friends/Service system     Religion: Religion/Spirituality Are You A Religious Person?:  Special educational needs teacher)  Leisure/Recreation: Leisure / Recreation Do You Have Hobbies?: No  Exercise/Diet: Exercise/Diet Do You Exercise?: No Do You Follow a Special Diet?:  (uta) Do You Have Any Trouble Sleeping?: No   CCA Employment/Education Employment/Work Situation: Employment / Work Situation Employment Situation: Unemployed Patient's Job has Been Impacted by Current Illness: No Has Patient ever Been in Passenger transport manager?: No  Education: Education Is Patient Currently Attending School?: No Last Grade Completed: 88 Did You Nutritional therapist?: Yes What Type of College Degree Do you Have?: some college Did You  Have An Individualized Education Program (IIEP):  [employer]) Did You Have Any Difficulty At School?:  [employer]) Patient's Education Has Been Impacted by Current Illness:  (uta)   CCA Family/Childhood History Family and Relationship History: Family history Does patient have children?: No  Childhood History:  Childhood History By whom was/is the patient raised?:  (uta) Did patient suffer any verbal/emotional/physical/sexual abuse as a child?: No Did patient suffer from severe childhood neglect?: No Has patient ever been sexually abused/assaulted/raped as an adolescent or adult?: No  Child/Adolescent Assessment:     CCA Substance Use Alcohol/Drug Use: Alcohol / Drug Use Pain Medications: see MAR Prescriptions: see MAR Over the Counter: see MAR History of alcohol / drug use?: No history of alcohol / drug abuse Longest period of sobriety (when/how long): denies                         ASAM's:  Six Dimensions of Multidimensional Assessment  Dimension 1:  Acute Intoxication and/or Withdrawal Potential:      Dimension 2:  Biomedical Conditions and Complications:      Dimension 3:  Emotional, Behavioral, or Cognitive Conditions and Complications:     Dimension 4:  Readiness to Change:     Dimension 5:  Relapse, Continued use, or Continued Problem Potential:     Dimension 6:  Recovery/Living Environment:     ASAM Severity Score:    ASAM Recommended Level of Treatment:     Substance use Disorder (SUD)    Recommendations for Services/Supports/Treatments: Recommendations for Services/Supports/Treatments Recommendations For Services/Supports/Treatments: Medication Management, Inpatient Hospitalization, Individual Therapy  Discharge Disposition:    DSM5 Diagnoses: Patient Active Problem List   Diagnosis Date Noted   Closed fracture of proximal end of right humerus with routine healing 01/01/2022   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   Avascular necrosis of femur head, right  ([place]) 05/23/2015   Constipation 09/20/2014   Loose stools 08/02/2014   Postoperative anemia due to acute blood loss 04/20/2014   Avascular necrosis of femur head, left ([place]) 04/19/2014   OA (osteoarthritis) of hip 04/19/2014   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   Pain in limb 12/19/2013   History of stroke 12/19/2013   Acute confusional state 03/24/2013   Low back pain    PTSD (Hall-traumatic stress disorder) 03/04/2013   Bipolar disorder ([place]) 03/04/2013   Anxiety    Depression    Stroke ([place])    Hyperthyroidism    Hyperlipidemia    Folate deficiency    Hx of abnormal Pap smear    Abdominal bloating 11/05/2011   IBS (irritable bowel syndrome) 11/05/2011   Chronic diarrhea 09/18/2011   Gastroparesis 09/18/2011   Drug abuse ([place]) 09/18/2011   Tubular adenoma of colon 09/18/2011  Family hx of colon cancer 09/18/2011   COLONIC POLYPS, ADENOMATOUS 07/08/2010   DIZZINESS 07/08/2010   GERD 05/16/2010   WEIGHT LOSS 05/16/2010   NAUSEA WITH VOMITING 05/16/2010   Dysphagia 05/16/2010   DIARRHEA 05/16/2010   ABDOMINAL PAIN, UNSPECIFIED SITE 05/16/2010     Referrals to Alternative Service(s): Referred to Alternative Service(s):   Place:   Date:   Time:    Referred to Alternative Service(s):   Place:   Date:   Time:    Referred to Alternative Service(s):   Place:   Date:   Time:    Referred to Alternative Service(s):   Place:   Date:   Time:     Sheryl Hall, Sheryl Hall

## 2022-06-21 NOTE — ED Notes (Signed)
Attempted to call nursing report to accepting facility, [facility]. Nurse currently passing meds and unable to take report at this time. Reported they will have the nurse call us back in about 10 minutes.

## 2022-06-21 NOTE — ED Notes (Signed)
Pt changed out in to burgundy scrubs per policy. Personal items x 1 bag locked in locker. Pt wanded by security

## 2022-06-21 NOTE — Progress Notes (Signed)
Per Sheryl Hall, patient meets criteria for inpatient treatment. There are no available beds at [facility] today. CSW faxed referrals to the following facilities for review:  [facility]  Pending - No Request Sent N/A [address] [place] [address] [phone] --  [place] No Request Sent N/A [address] [place] [address] [phone] --  [facility]  Pending - No Request Sent N/A [address] [place] [address] [phone] --  [facility]  Pending - No Request Sent N/A [address] [place] [address] [phone] --  [address] [place] [address] [phone] --  [place]  Pending - No Request Sent N/A [address] [place] [address] [phone] --  [facility]  Pending - No Request Sent N/A [address] [place], [place] [address] [phone] --  [facility]  Pending - No Request Sent N/A [address] [place] [address] [place] --  [facility]  Pending - No Request Sent N/A [address] [place] [address] [facility]  Pending - No Request Sent N/A [address] [place] [address] [phone] --  [facility]  Pending - No Request Sent N/A [address] [place] [address] [phone] --  [place]  Pending - No Request Sent N/A [address], [place] [address] [phone] --  [facility]  Pending - No Request Sent N/A [address] [place] [address] [phone] --  [place]  Pending - No  Request Sent N/A [address] [place] [address] [phone] --  [facility]  Pending - No Request Sent N/A [address] [place]  [address] [phone] --  [facility]  Pending - No Request Sent N/A [address] [place]  [address] [phone] --   TTS will continue to seek bed placement.  Glennie Isle, MSW, [place] Phone: [phone] Disposition/TOC

## 2022-06-21 NOTE — Progress Notes (Signed)
Per Sherri Sear admissions, pt has been accepted to [facility]. Accepting provider is Dr. Kathlene Cote. Patient can arrive anytime. Number for report is [phone].   Glennie Isle, MSW, LCSW-A Phone: [phone] Disposition/TOC

## 2022-07-03 ENCOUNTER — Other Ambulatory Visit: Payer: Self-pay | Admitting: Family Medicine

## 2022-07-03 NOTE — Telephone Encounter (Signed)
Requested medication (s) are due for refill today:   Requested medication (s) are on the active medication list: Atorvastatin is not on list  Last refill:    Future visit scheduled: No  Notes to clinic:  See request.    Requested Prescriptions  Pending Prescriptions Disp Refills   atorvastatin (LIPITOR) 40 MG tablet [Pharmacy Med Name: ATORVASTATIN 40 MG TABLET] 90 tablet 0    Sig: TAKE (1) TABLET BY MOUTH ONCE DAILY.     Cardiovascular:  Antilipid - Statins Failed - 07/03/2022  8:54 AM      Failed - Lipid Panel in normal range within the last 12 months    Cholesterol  Date Value Ref Range Status  07/18/2021 144 <200 mg/dL Final   LDL Cholesterol (Calc)  Date Value Ref Range Status  07/18/2021 78 mg/dL (calc) Final    Comment:    Reference range: <100 . Desirable range <100 mg/dL for primary prevention;   <70 mg/dL for patients with CHD or diabetic patients  with > or = 2 CHD risk factors. Marland Kitchen LDL-C is now calculated using the Martin-Hopkins  calculation, which is a validated novel method providing  better accuracy than the Friedewald equation in the  estimation of LDL-C.  Cresenciano Genre et al. Annamaria Helling. 1027;253(66): 2061-2068  (http://education.[facility].com/faq/FAQ164)    HDL  Date Value Ref Range Status  07/18/2021 45 (L) > OR = 50 mg/dL Final   Triglycerides  Date Value Ref Range Status  07/18/2021 133 <150 mg/dL Final         Passed - Patient is not pregnant      Passed - Valid encounter within last 12 months    Recent Outpatient Visits           11 months ago Other specified hypothyroidism   [place] Pickard, Cammie Mcgee, MD   1 year ago Bipolar I disorder [facility])   [place] Pickard, Cammie Mcgee, MD   2 years ago Other specified hypothyroidism   [place] Susy Frizzle, MD   3 years ago Chest pain due to gastrointestinal reflux disease   [place] Susy Frizzle, MD   4  years ago Hypothyroidism, unspecified type   [place] Pickard, Cammie Mcgee, MD               levothyroxine (SYNTHROID) 50 MCG tablet [Pharmacy Med Name: LEVOTHYROXINE 50 MCG TABLET] 90 tablet 0    Sig: TAKE ONE TABLET BY MOUTH ONCE DAILY.     Endocrinology:  Hypothyroid Agents Passed - 07/03/2022  8:54 AM      Passed - TSH in normal range and within 360 days    TSH  Date Value Ref Range Status  05/05/2022 2.73 0.40 - 4.50 mIU/L Final         Passed - Valid encounter within last 12 months    Recent Outpatient Visits           11 months ago Other specified hypothyroidism   [place] Pickard, Cammie Mcgee, MD   1 year ago Bipolar I disorder [facility])   [place] Pickard, Cammie Mcgee, MD   2 years ago Other specified hypothyroidism   [place] Susy Frizzle, MD   3 years ago Chest pain due to gastrointestinal reflux disease   [place] Susy Frizzle, MD   4 years ago Hypothyroidism, unspecified type   [facility] Pickard, Cammie Mcgee, MD

## 2022-07-11 ENCOUNTER — Telehealth: Payer: Self-pay

## 2022-07-11 NOTE — Telephone Encounter (Signed)
F/U w/[place] [employer] for PA on [employer].  Per [place] [facility]acks will have to call back on Monday for response.  QV#[id number] [id number]

## 2022-07-11 NOTE — Telephone Encounter (Signed)
Disregard, error/duplicate

## 2022-07-17 ENCOUNTER — Other Ambulatory Visit: Payer: Self-pay | Admitting: Family Medicine

## 2022-07-17 NOTE — Telephone Encounter (Signed)
Called patient to schedule appt. Medication refills/ yearly appt . Patient did not want to schedule at this time. Patient reports she will call back.

## 2022-07-17 NOTE — Telephone Encounter (Signed)
Requested medication (s) are due for refill today: yes   Requested medication (s) are on the active medication list: yes   Last refill:  03/14/22 #30 2 refills  Future visit scheduled: no   Notes to clinic:  not delegated per protocol. Last labs 07/18/21 . Called patient to schedule appt medication refills/ yearly appt . Patient reports she will call back to schedule appt. Do you want to refill Rx?     Requested Prescriptions  Pending Prescriptions Disp Refills   OLANZapine (ZYPREXA) 10 MG tablet [Pharmacy Med Name: OLANZAPINE 10MG TABLETS] 30 tablet 0    Sig: TAKE (1) TABLET BY MOUTH AT BEDTIME.     Not Delegated - Psychiatry:  Antipsychotics - Second Generation (Atypical) - olanzapine Failed - 07/17/2022  8:53 AM      Failed - This refill cannot be delegated      Failed - Last BP in normal range    BP Readings from Last 1 Encounters:  06/21/22 140/79         Failed - Valid encounter within last 6 months    Recent Outpatient Visits           12 months ago Other specified hypothyroidism   [place] Pickard, Cammie Mcgee, MD   1 year ago Bipolar I disorder [facility])   [place] Pickard, Cammie Mcgee, MD   2 years ago Other specified hypothyroidism   [place] Susy Frizzle, MD   3 years ago Chest pain due to gastrointestinal reflux disease   [place] Dennard Schaumann, Cammie Mcgee, MD   4 years ago Hypothyroidism, unspecified type   [place] Susy Frizzle, MD              Failed - Lipid Panel in normal range within the last 12 months    Cholesterol  Date Value Ref Range Status  07/18/2021 144 <200 mg/dL Final   LDL Cholesterol (Calc)  Date Value Ref Range Status  07/18/2021 78 mg/dL (calc) Final    Comment:    Reference range: <100 . Desirable range <100 mg/dL for primary prevention;   <70 mg/dL for patients with CHD or diabetic patients  with > or = 2 CHD risk factors. Marland Kitchen LDL-C is  now calculated using the Martin-Hopkins  calculation, which is a validated novel method providing  better accuracy than the Friedewald equation in the  estimation of LDL-C.  Cresenciano Genre et al. Annamaria Helling. 3559;741(63): 2061-2068  (http://education.[facility].com/faq/FAQ164)    HDL  Date Value Ref Range Status  07/18/2021 45 (L) > OR = 50 mg/dL Final   Triglycerides  Date Value Ref Range Status  07/18/2021 133 <150 mg/dL Final         Passed - TSH in normal range and within 360 days    TSH  Date Value Ref Range Status  05/05/2022 2.73 0.40 - 4.50 mIU/L Final         Passed - Completed PHQ-2 or PHQ-9 in the last 360 days      Passed - Last Heart Rate in normal range    Pulse Readings from Last 1 Encounters:  06/21/22 75         Passed - CBC within normal limits and completed in the last 12 months    WBC  Date Value Ref Range Status  06/20/2022 5.4 4.0 - 10.5 K/uL Final   RBC  Date Value Ref Range Status  06/20/2022 4.79 3.87 -  5.11 MIL/uL Final   Hemoglobin  Date Value Ref Range Status  06/20/2022 11.5 (L) 12.0 - 15.0 g/dL Final  08/25/2016 13.0 11.1 - 15.9 g/dL Final   HCT  Date Value Ref Range Status  06/20/2022 38.7 36.0 - 46.0 % Final  09/18/2011 45 % Final   Hematocrit  Date Value Ref Range Status  08/25/2016 39.9 34.0 - 46.6 % Final   MCHC  Date Value Ref Range Status  06/20/2022 29.7 (L) 30.0 - 36.0 g/dL Final   [facility]  Date Value Ref Range Status  06/20/2022 24.0 (L) 26.0 - 34.0 pg Final   MCV  Date Value Ref Range Status  06/20/2022 80.8 80.0 - 100.0 fL Final  08/25/2016 88 79 - 97 fL Final  10/27/2014 87 80 - 100 fL Final  09/18/2011 103.7 fL Final   No results found for: "PLTCOUNTKUC", "LABPLAT", "POCPLA" RDW  Date Value Ref Range Status  06/20/2022 16.4 (H) 11.5 - 15.5 % Final  08/25/2016 14.4 12.3 - 15.4 % Final  10/27/2014 14.2 11.5 - 14.5 % Final         Passed - CMP within normal limits and completed in the last 12 months     Albumin  Date Value Ref Range Status  06/20/2022 3.3 (L) 3.5 - 5.0 g/dL Final  09/07/2017 4.1 3.5 - 5.5 g/dL Final  10/27/2014 3.3 (L) 3.4 - 5.0 g/dL Final   Alkaline Phosphatase  Date Value Ref Range Status  06/20/2022 101 38 - 126 U/L Final  10/27/2014 119 (H) Unit/L Final    Comment:    46-116 NOTE: New Reference Range 06/13/14   08/27/2011 91 U/L Final   Alkaline phosphatase (APISO)  Date Value Ref Range Status  05/05/2022 105 37 - 153 U/L Final   ALT  Date Value Ref Range Status  06/20/2022 9 0 - 44 U/L Final   SGPT (ALT)  Date Value Ref Range Status  10/27/2014 14 U/L Final    Comment:    14-63 NOTE: New Reference Range 06/13/14    AST  Date Value Ref Range Status  06/20/2022 15 15 - 41 U/L Final  08/27/2011 11 U/L Final   SGOT(AST)  Date Value Ref Range Status  10/27/2014 16 15 - 37 Unit/L Final   BUN  Date Value Ref Range Status  06/20/2022 6 6 - 20 mg/dL Final  09/07/2017 7 6 - 24 mg/dL Final  10/27/2014 10 7 - 18 mg/dL Final   Calcium  Date Value Ref Range Status  06/20/2022 8.3 (L) 8.9 - 10.3 mg/dL Final  08/27/2011 9.9 mg/dL Final   Calcium, Total  Date Value Ref Range Status  10/27/2014 8.9 8.5 - 10.1 mg/dL Final   Calcium, Ion  Date Value Ref Range Status  03/18/2011 1.16 1.12 - 1.32 mmol/L Final   CO2  Date Value Ref Range Status  06/20/2022 27 22 - 32 mmol/L Final  08/27/2011 25 mmol/L Final   Co2  Date Value Ref Range Status  10/27/2014 29 21 - 32 mmol/L Final   TCO2  Date Value Ref Range Status  03/18/2011 24 0 - 100 mmol/L Final   Creat  Date Value Ref Range Status  05/05/2022 0.95 0.50 - 1.05 mg/dL Final   Creatinine, Ser  Date Value Ref Range Status  06/20/2022 0.94 0.44 - 1.00 mg/dL Final   Creatinine,U  Date Value Ref Range Status  07/08/2010 111.9 mg/dL Final    Comment:    See lab report for associated comment(s)  Glucose  Date Value Ref Range Status  10/27/2014 94 65 - 99 mg/dL Final  08/27/2011 116  mg/dL Final   Glucose, Bld  Date Value Ref Range Status  06/20/2022 100 (H) 70 - 99 mg/dL Final    Comment:    Glucose reference range applies only to samples taken after fasting for at least 8 hours.   Glucose-Capillary  Date Value Ref Range Status  06/20/2022 109 (H) 70 - 99 mg/dL Final    Comment:    Glucose reference range applies only to samples taken after fasting for at least 8 hours.   Potassium  Date Value Ref Range Status  06/20/2022 4.1 3.5 - 5.1 mmol/L Final  10/27/2014 4.2 3.5 - 5.1 mmol/L Final  08/27/2011 4.4 mmol/L Final   Sodium  Date Value Ref Range Status  06/20/2022 141 135 - 145 mmol/L Final  09/07/2017 142 134 - 144 mmol/L Final  10/27/2014 144 136 - 145 mmol/L Final   Total Bilirubin  Date Value Ref Range Status  06/20/2022 0.4 0.3 - 1.2 mg/dL Final  08/27/2011 0.5 mg/dL Final   Bilirubin,Total  Date Value Ref Range Status  10/27/2014 0.4 0.2 - 1.0 mg/dL Final   Bilirubin Total  Date Value Ref Range Status  09/07/2017 <0.2 0.0 - 1.2 mg/dL Final   Bilirubin, Direct  Date Value Ref Range Status  11/07/2013 <0.1 0.0 - 0.3 mg/dL Final   Indirect Bilirubin  Date Value Ref Range Status  11/07/2013 NOT CALCULATED 0.3 - 0.9 mg/dL Final   Protein, ur  Date Value Ref Range Status  05/02/2019 NEGATIVE NEGATIVE mg/dL Final   Protein Ur, POC  Date Value Ref Range Status  02/29/2020 =100 (A) negative mg/dL Final   Total Protein  Date Value Ref Range Status  06/20/2022 6.0 (L) 6.5 - 8.1 g/dL Final  09/07/2017 6.1 6.0 - 8.5 g/dL Final  10/27/2014 5.9 (L) 6.4 - 8.2 g/dL Final   Total Protein ELP  Date Value Ref Range Status  08/27/2011 6.6  Final   GFR, Est African American  Date Value Ref Range Status  05/14/2020 68 > OR = 60 mL/min/1.66m Final   eGFR  Date Value Ref Range Status  05/05/2022 69 > OR = 60 mL/min/1.761mFinal    Comment:    The eGFR is based on the CKD-EPI 2021 equation. To calculate  the new eGFR from a previous  Creatinine or Cystatin C result, go to https://www.kidney.org/professionals/ kdoqi/gfr%5Fcalculator    GFR, Est Non African American  Date Value Ref Range Status  05/14/2020 58 (L) > OR = 60 mL/min/1.732minal   GFR, Estimated  Date Value Ref Range Status  06/20/2022 >60 >60 mL/min Final    Comment:    (NOTE) Calculated using the CKD-EPI Creatinine Equation (2021)

## 2022-07-21 ENCOUNTER — Ambulatory Visit: Payer: Medicaid Other | Admitting: Family Medicine

## 2022-07-23 ENCOUNTER — Ambulatory Visit: Payer: Medicaid Other | Admitting: Neurology

## 2022-07-28 ENCOUNTER — Other Ambulatory Visit: Payer: Self-pay

## 2022-07-28 ENCOUNTER — Emergency Department (HOSPITAL_COMMUNITY)
Admission: EM | Admit: 2022-07-28 | Discharge: 2022-07-28 | Disposition: A | Discharge status: Home / Self Care | Payer: Medicaid Other | Attending: Emergency Medicine | Admitting: Emergency Medicine

## 2022-07-28 ENCOUNTER — Encounter (HOSPITAL_COMMUNITY): Payer: Self-pay | Admitting: Emergency Medicine

## 2022-07-28 DIAGNOSIS — E039 Hypothyroidism, unspecified: Secondary | ICD-10-CM | POA: Diagnosis not present

## 2022-07-28 DIAGNOSIS — E860 Dehydration: Secondary | ICD-10-CM | POA: Insufficient documentation

## 2022-07-28 DIAGNOSIS — R339 Retention of urine, unspecified: Secondary | ICD-10-CM | POA: Diagnosis not present

## 2022-07-28 DIAGNOSIS — Z79899 Other long term (current) drug therapy: Secondary | ICD-10-CM | POA: Diagnosis not present

## 2022-07-28 DIAGNOSIS — R300 Dysuria: Secondary | ICD-10-CM | POA: Diagnosis not present

## 2022-07-28 DIAGNOSIS — R112 Nausea with vomiting, unspecified: Secondary | ICD-10-CM | POA: Diagnosis not present

## 2022-07-28 DIAGNOSIS — R42 Dizziness and giddiness: Secondary | ICD-10-CM | POA: Diagnosis present

## 2022-07-28 LAB — CBC WITH DIFFERENTIAL/PLATELET
Abs Immature Granulocytes: 0.02 10*3/uL (ref 0.00–0.07)
Basophils Absolute: 0 10*3/uL (ref 0.0–0.1)
Basophils Relative: 0 %
Eosinophils Absolute: 0.1 10*3/uL (ref 0.0–0.5)
Eosinophils Relative: 1 %
HCT: 39.9 % (ref 36.0–46.0)
Hemoglobin: 12.2 g/dL (ref 12.0–15.0)
Immature Granulocytes: 0 %
Lymphocytes Relative: 22 %
Lymphs Abs: 1.8 10*3/uL (ref 0.7–4.0)
MCH: 24.5 pg — ABNORMAL LOW (ref 26.0–34.0)
MCHC: 30.6 g/dL (ref 30.0–36.0)
MCV: 80.3 fL (ref 80.0–100.0)
Monocytes Absolute: 0.5 10*3/uL (ref 0.1–1.0)
Monocytes Relative: 7 %
Neutro Abs: 5.6 10*3/uL (ref 1.7–7.7)
Neutrophils Relative %: 70 %
Platelets: 213 10*3/uL (ref 150–400)
RBC: 4.97 MIL/uL (ref 3.87–5.11)
RDW: 17.9 % — ABNORMAL HIGH (ref 11.5–15.5)
WBC: 8 10*3/uL (ref 4.0–10.5)
nRBC: 0 % (ref 0.0–0.2)

## 2022-07-28 LAB — BASIC METABOLIC PANEL
Anion gap: 10 (ref 5–15)
BUN: 12 mg/dL (ref 6–20)
CO2: 25 mmol/L (ref 22–32)
Calcium: 8.7 mg/dL — ABNORMAL LOW (ref 8.9–10.3)
Chloride: 105 mmol/L (ref 98–111)
Creatinine, Ser: 1.04 mg/dL — ABNORMAL HIGH (ref 0.44–1.00)
GFR, Estimated: >60 mL/min (ref >60)
Glucose, Bld: 106 mg/dL — ABNORMAL HIGH (ref 70–99)
Potassium: 3.6 mmol/L (ref 3.5–5.1)
Sodium: 140 mmol/L (ref 135–145)

## 2022-07-28 LAB — URINALYSIS, ROUTINE W REFLEX MICROSCOPIC
Bilirubin Urine: NEGATIVE
Glucose, UA: NEGATIVE mg/dL
Hgb urine dipstick: NEGATIVE
Ketones, ur: NEGATIVE mg/dL
Leukocytes,Ua: NEGATIVE
Nitrite: NEGATIVE
Protein, ur: 30 mg/dL — AB
Specific Gravity, Urine: 1.024 (ref 1.005–1.030)
pH: 5 (ref 5.0–8.0)

## 2022-07-28 MED ORDER — ONDANSETRON HCL 4 MG/2ML IJ SOLN
4.0000 mg | Freq: Once | INTRAMUSCULAR | Status: AC
  Administered 2022-07-28: 4 mg via INTRAVENOUS
  Filled 2022-07-28: qty 2

## 2022-07-28 MED ORDER — ONDANSETRON 4 MG PO TBDP: 4.0000 mg | ORAL_TABLET | Freq: Three times a day (TID) | ORAL | 0 refills | Status: DC | PRN

## 2022-07-28 MED ORDER — SODIUM CHLORIDE 0.9 % IV BOLUS
1000.0000 mL | Freq: Once | INTRAVENOUS | Status: AC
  Administered 2022-07-28: 1000 mL via INTRAVENOUS

## 2022-07-28 NOTE — ED Notes (Addendum)
2nd RN attempted IV access without success, per PA pt will require IV access, John, RN to attempt IV access

## 2022-07-28 NOTE — ED Provider Notes (Signed)
Care assumed from [place], [place] at shift change, please see their note for full detail, but in brief Sheryl Hall is a 61 y.o. female presents with dizziness and nausea without vomiting.  Plan: Anticipate discharge after Zofran and fluids  Physical Exam  BP 115/69 (BP Location: Left Arm)   Pulse 77   Temp 97.9 F (36.6 C) (Oral)   Resp 16   Ht '5\' 6"'$  (1.676 m)   Wt 84.8 kg   SpO2 92%   BMI 30.18 kg/m   Physical Exam  Procedures  Procedures  ED Course / MDM    Medical Decision Making Amount and/or Complexity of Data Reviewed Labs: ordered.  Risk Prescription drug management.   Patient is feeling much better after fluids and Zofran.  She states symptoms are largely improved.  She has no evidence of urinary tract infection on urinalysis.  BMP and CBC were fairly unremarkable although she does appear slightly dehydrated with creatinine 1.04.  Advised increased fluid intake.  We will also send home with prescription of Zofran should her nausea return.  If she is continuing to have urinary symptoms despite increased fluid intake, she can follow-up with urology.  Referral provided.  Patient expresses understanding and is amenable to plan.  Discharged home in good condition.       Tonye Pearson, [place] 07/28/22 2025    Hayden Rasmussen, MD 07/29/22 [phone]

## 2022-07-28 NOTE — ED Notes (Signed)
Pt in gown and on monitor 

## 2022-07-28 NOTE — Discharge Instructions (Addendum)
A prescription for Zofran has been sent to your pharmacy.  This is an antinausea medication.  Take this by placing 1 dissolvable tablet under the tongue every 6-8 hours as needed.  Please follow-up with your primary provider who is managing your anxiety treatment regimen, and has been prescribing your Atarax.  Return to the ED for any new or worsening symptoms as discussed.  If your urinary symptoms persist despite increase in fluids, please call the urology referral above.

## 2022-07-28 NOTE — ED Triage Notes (Signed)
Pt to the ED with complaints of dizziness for the last 3 days with nausea.

## 2022-07-28 NOTE — ED Notes (Signed)
Unable to gain IV access at this time, 2nd RN to attempt

## 2022-07-28 NOTE — ED Provider Notes (Signed)
[facility] EMERGENCY DEPARTMENT Provider Note   CSN: [id number] Arrival date & time: 07/28/22  1447     History  Chief Complaint  Patient presents with   Dizziness    Sheryl Hall is a 61 y.o. female with history of bipolar type I, PTSD, GERD, polysubstance abuse, anxiety, chronic nausea, hypothyroidism, seizures, headaches, prior CVA, and panic attacks.  Presenting today with dizziness and nausea without vomiting.  Unsure of what worsens the dizziness, rest usually improves it.  Not specifically associated with head rotation/ROM, or positional changes.  Initially reports dizziness over the last 3 days.  When asked about this further, she describes this more as lightheadedness over the last three weeks after starting a new medication for her anxiety.  Used to take Ativan for her anxiety, however was switched at this point to Atarax.  Per records, switch appears to be due to her recent suicide attempt and hospitalization from Ativan overdose.  She states the Atarax does not help as much, and still feels anxious.  Notes decreased appetite and fluid intake.  Without abdominal pain, vomiting, constipation, neck stiffness, fever, chills, shortness of breath, chest pain, or focal neurological deficits.  However does endorse dysuria, dark urine, and mild urinary retention over the last month.  Also with 1-2 episodes of diarrhea yesterday that is since resolved.  Denies active dizziness.  No other complaints at this time.  The history is provided by the patient and medical records.  Dizziness    Home Medications Prior to Admission medications   Medication Sig Start Date End Date Taking? Authorizing Provider  atorvastatin (LIPITOR) 40 MG tablet TAKE (1) TABLET BY MOUTH ONCE DAILY. 07/03/22   Susy Frizzle, MD  escitalopram (LEXAPRO) 10 MG tablet Take 1 tablet (10 mg total) by mouth daily. Take with 20 mg a day 06/05/22   Susy Frizzle, MD  levETIRAcetam (KEPPRA) 1000 MG tablet Take 0.5  tablet in morning and 1.5 tablet in evening. 06/11/22   Suzzanne Cloud, NP  levothyroxine (SYNTHROID) 50 MCG tablet TAKE ONE TABLET BY MOUTH ONCE DAILY. 07/03/22   Susy Frizzle, MD  LORazepam (ATIVAN) 1 MG tablet Take 1 tablet (1 mg total) by mouth 2 (two) times daily. 06/05/22   Susy Frizzle, MD  Multiple Vitamins-Minerals (ONE-A-DAY WOMENS 50+ PO) Take 1 tablet by mouth daily.    [provider]  OLANZapine (ZYPREXA) 10 MG tablet TAKE (1) TABLET BY MOUTH AT BEDTIME. 07/18/22   Susy Frizzle, MD  omeprazole (PRILOSEC) 40 MG capsule TAKE (1) CAPSULE BY MOUTH TWICE DAILY. Patient taking differently: Take 40 mg by mouth 2 (two) times daily. TAKE (1) CAPSULE BY MOUTH TWICE DAILY. 11/20/21   Susy Frizzle, MD  Oxcarbazepine (TRILEPTAL) 300 MG tablet TAKE (1) TABLET BY MOUTH TWICE DAILY. Patient taking differently: Take 300 mg by mouth 2 (two) times daily. 04/22/22   Susy Frizzle, MD  valACYclovir (VALTREX) 500 MG tablet TAKE ONE TABLET BY MOUTH ONCE DAILY. 09/05/21   Susy Frizzle, MD  vortioxetine HBr (TRINTELLIX) 10 MG TABS tablet Take 1 tablet (10 mg total) by mouth daily. Patient not taking: Reported on 06/05/2022 05/05/22   Susy Frizzle, MD  LINZESS 72 MCG capsule TAKE ONE CAPSULE ORALLY EVERY MORNING BEFORE BREAKFAST. Patient taking differently: Take 72 mcg by mouth daily before breakfast.  06/08/19 02/29/20  Carlis Stable, NP  traZODone (DESYREL) 100 MG tablet TAKE (1) TABLET BY MOUTH AT BEDTIME. 08/11/19 02/29/20  Jenna Luo  T, MD      Allergies    Patient has no known allergies.    Review of Systems   Review of Systems  Neurological:  Positive for dizziness.    Physical Exam Updated Vital Signs BP 115/69 (BP Location: Left Arm)   Pulse 77   Temp 97.9 F (36.6 C) (Oral)   Resp 16   Ht '5\' 6"'$  (1.676 m)   Wt 84.8 kg   SpO2 92%   BMI 30.18 kg/m  Physical Exam Vitals and nursing note reviewed.  Constitutional:      General: She is not in acute  distress.    Appearance: She is well-developed. She is not ill-appearing or toxic-appearing.  HENT:     Head: Normocephalic and atraumatic.     Nose: Nose normal.     Mouth/Throat:     Mouth: Mucous membranes are dry.     Pharynx: Oropharynx is clear.  Eyes:     Extraocular Movements: Extraocular movements intact.     Conjunctiva/sclera: Conjunctivae normal.     Pupils: Pupils are equal, round, and reactive to light.  Cardiovascular:     Rate and Rhythm: Normal rate and regular rhythm.     Pulses: Normal pulses.     Heart sounds: Normal heart sounds. No murmur heard. Pulmonary:     Effort: Pulmonary effort is normal. No respiratory distress.     Breath sounds: Normal breath sounds. No wheezing.  Chest:     Chest wall: No tenderness.  Abdominal:     General: There is no distension.     Palpations: Abdomen is soft.     Tenderness: There is no abdominal tenderness. There is no guarding.  Musculoskeletal:        General: No swelling.     Cervical back: Neck supple.  Skin:    General: Skin is warm and dry.     Capillary Refill: Capillary refill takes less than 2 seconds.     Coloration: Skin is not jaundiced.  Neurological:     Mental Status: She is alert and oriented to person, place, and time.     GCS: GCS eye subscore is 4. GCS verbal subscore is 5. GCS motor subscore is 6.     Sensory: No sensory deficit.     Motor: No weakness, tremor or seizure activity.     Coordination: Coordination normal.     Comments: No truncal deviation or weakness.  PERRLA.  Normal EOMs.  CN V, VII, IX, X, XI, XII appear grossly intact.  Sensation and range of motion appears grossly intact.  No facial asymmetry or dysarthria.  Psychiatric:        Mood and Affect: Mood is anxious.     ED Results / Procedures / Treatments   Labs (all labs ordered are listed, but only abnormal results are displayed) Labs Reviewed  BASIC METABOLIC PANEL  CBC WITH DIFFERENTIAL/PLATELET  URINALYSIS, ROUTINE W  REFLEX MICROSCOPIC    EKG EKG Interpretation  Date/Time:  Monday July 28 2022 17:53:36 EDT Ventricular Rate:  72 PR Interval:  186 QRS Duration: 77 QT Interval:  415 QTC Calculation: 455 R Axis:   44 Text Interpretation: Sinus rhythm Low voltage, precordial leads No significant change since prior 8/20 Confirmed by Aletta Edouard [phone]) on 07/28/2022 6:01:01 PM  Radiology No results found.  Procedures Procedures    Medications Ordered in ED Medications  sodium chloride 0.9 % bolus 1,000 mL (has no administration in time range)  ondansetron (ZOFRAN) injection 4  mg (has no administration in time range)    ED Course/ Medical Decision Making/ A&P                           Medical Decision Making Amount and/or Complexity of Data Reviewed Labs: ordered.   61 y.o. female presents to the ED for concern of Dizziness   This involves an extensive number of treatment options, and is a complaint that carries with it a high risk of complications and morbidity.     Past Medical History / Co-morbidities / Social History: Hx of bipolar type I, PTSD, GERD, polysubstance abuse, anxiety, chronic nausea, hypothyroidism, seizures, headaches, prior CVA, and panic attacks Social Determinants of Health include: Elderly  Additional History:  Obtained by chart review.  Notably ED visit from intentional overdose on 06/20/2022 in which she attempted to commit suicide with 10 tablets of Ativan.  Noted Hx of previous suicide attempts.  Lab Tests: Pending  Imaging Studies: I personally ordered and interpreted EKG 12 lead findings, which showed: Normal sinus rhythm   ED Course: Pt overall well-appearing on exam.  Resting comfortably, appears mildly anxious and clinically dehydrated.  Nontoxic, nonseptic appearing in NAD.  Presenting with 3 weeks of mild lightheadedness and intermittent nausea.  At this time, was switched from Ativan to Atarax for anxiety relief by behavioral health provider.   Notably 06/20/2022 had attempted suicide via overdose of Ativan.  I suspect this is contributory to the change in anxiety treatment regimen.  Hx of chronic nausea as well.   Without vomiting, abdominal pain, palpitations, chest pain, shortness of breath, or neurological deficits.  Neuro exam unremarkable as described above.  Low suspicion of acute CVA.  Does not appear to be seizing or in status epilepticus.  Hemodynamically stable.  Afebrile.  Satting at 92%, however fingers are usually somewhat cold per patient.  Breathing well without difficulty or evidence of respiratory distress, CTAB, suspect monitor error of pulse ox at this time.  EKG overall unremarkable, without evidence of arrhythmia, BBB, ischemia, or infarction.  Also with some intermittent dysuria, mild urinary retention, and dark urine color, clinically suggestive of possible UTI.  UA pending.  Has not been evaluated by urology per pt.  Fluid bolus provided.  Disposition: 1900 care of LAGRETTA LOSEKE transferred to [employer] at the end of my shift as the patient will require reassessment once labs/imaging have resulted.  Patient case discussed at length.  Please see his/her note for further details regarding further ED course and disposition.   Plan at time of handoff is to reassess after fluids.  If improvement with fluids, likely anticipate discharge with zofran and close follow up with Provider who manages behavioral health/anxiety.  If UTI identified, plan to treat.  If no improvement, may consider further workup or treatment as necessary.  This may be altered or completely changed at the discretion of the oncoming team pending results of further workup.   This chart was dictated using voice recognition software.  Despite best efforts to proofread, errors can occur which can change the documentation meaning.         Final Clinical Impression(s) / ED Diagnoses Final diagnoses:  Dehydration  Lightheadedness    Rx / DC  Orders ED Discharge Orders     None         Candace Cruise 05/19/93 1911    Hayden Rasmussen, MD 07/29/22 1144

## 2022-07-30 ENCOUNTER — Telehealth: Payer: Self-pay

## 2022-07-31 NOTE — Telephone Encounter (Signed)
Transition Care Management Unsuccessful Follow-up Telephone Call  Date of discharge and from where:  07/28/22 From [facility] Dept  Attempts:  1st Attempt  Reason for unsuccessful TCM follow-up call:  Unable to leave message

## 2022-08-01 ENCOUNTER — Telehealth: Payer: Self-pay

## 2022-08-01 NOTE — Telephone Encounter (Signed)
Transition Care Management Unsuccessful Follow-up Telephone Call  Date of discharge and from where:  07/28/22  Attempts:  2nd Attempt  Reason for unsuccessful TCM follow-up call:  Unable to leave message

## 2022-08-15 ENCOUNTER — Other Ambulatory Visit: Payer: Self-pay | Admitting: Family Medicine

## 2022-08-15 NOTE — Telephone Encounter (Signed)
Requested medication (s) are due for refill today - yes  Requested medication (s) are on the active medication list -yes  Future visit scheduled -no  Last refill: 07/18/22 #30  Notes to clinic: non delegated Rx  Requested Prescriptions  Pending Prescriptions Disp Refills   OLANZapine (ZYPREXA) 10 MG tablet [Pharmacy Med Name: OLANZAPINE 10MG TABLETS] 30 tablet 0    Sig: TAKE (1) TABLET BY MOUTH AT BEDTIME.     Not Delegated - Psychiatry:  Antipsychotics - Second Generation (Atypical) - olanzapine Failed - 08/15/2022  8:38 AM      Failed - This refill cannot be delegated      Failed - Last BP in normal range    BP Readings from Last 1 Encounters:  07/28/22 (!) 149/67         Failed - Valid encounter within last 6 months    Recent Outpatient Visits           1 year ago Other specified hypothyroidism   [place] Pickard, Cammie Mcgee, MD   1 year ago Bipolar I disorder [facility])   [place] Pickard, Cammie Mcgee, MD   2 years ago Other specified hypothyroidism   [place] Susy Frizzle, MD   3 years ago Chest pain due to gastrointestinal reflux disease   [place] [facility], Cammie Mcgee, MD   4 years ago Hypothyroidism, unspecified type   [place] Susy Frizzle, [place]              Failed - Lipid Panel in normal range within the last 12 months    Cholesterol  Date Value Ref Range Status  07/18/2021 144 <200 mg/dL Final   LDL Cholesterol (Calc)  Date Value Ref Range Status  07/18/2021 78 mg/dL (calc) Final    Comment:    Reference range: <100 . Desirable range <100 mg/dL for primary prevention;   <70 mg/dL for patients with CHD or diabetic patients  with > or = 2 CHD risk factors. Marland Kitchen LDL-C is now calculated using the Martin-Hopkins  calculation, which is a validated novel method providing  better accuracy than the Friedewald equation in the  estimation of LDL-C.  Cresenciano Genre et al.  Annamaria Helling. 4967;591(63): 2061-2068  (http://education.[facility].com/faq/FAQ164)    HDL  Date Value Ref Range Status  07/18/2021 45 (L) > OR = 50 mg/dL Final   Triglycerides  Date Value Ref Range Status  07/18/2021 133 <150 mg/dL Final         Passed - TSH in normal range and within 360 days    TSH  Date Value Ref Range Status  05/05/2022 2.73 0.40 - 4.50 mIU/L Final         Passed - Completed PHQ-2 or PHQ-9 in the last 360 days      Passed - Last Heart Rate in normal range    Pulse Readings from Last 1 Encounters:  07/28/22 77         Passed - CBC within normal limits and completed in the last 12 months    WBC  Date Value Ref Range Status  07/28/2022 8.0 4.0 - 10.5 K/uL Final   RBC  Date Value Ref Range Status  07/28/2022 4.97 3.87 - 5.11 MIL/uL Final   Hemoglobin  Date Value Ref Range Status  07/28/2022 12.2 12.0 - 15.0 g/dL Final  08/25/2016 13.0 11.1 - 15.9 g/dL Final   HCT  Date Value Ref Range Status  07/28/2022 39.9 36.0 - 46.0 % Final  09/18/2011 45 % Final   Hematocrit  Date Value Ref Range Status  08/25/2016 39.9 34.0 - 46.6 % Final   MCHC  Date Value Ref Range Status  07/28/2022 30.6 30.0 - 36.0 g/dL Final   MCH  Date Value Ref Range Status  07/28/2022 24.5 (L) 26.0 - 34.0 pg Final   MCV  Date Value Ref Range Status  07/28/2022 80.3 80.0 - 100.0 fL Final  08/25/2016 88 79 - 97 fL Final  10/27/2014 87 80 - 100 fL Final  09/18/2011 103.7 fL Final   No results found for: "PLTCOUNTKUC", "LABPLAT", "POCPLA" RDW  Date Value Ref Range Status  07/28/2022 17.9 (H) 11.5 - 15.5 % Final  08/25/2016 14.4 12.3 - 15.4 % Final  10/27/2014 14.2 11.5 - 14.5 % Final         Passed - CMP within normal limits and completed in the last 12 months    Albumin  Date Value Ref Range Status  06/20/2022 3.3 (L) 3.5 - 5.0 g/dL Final  09/07/2017 4.1 3.5 - 5.5 g/dL Final  10/27/2014 3.3 (L) 3.4 - 5.0 g/dL Final   Alkaline Phosphatase  Date Value Ref Range  Status  06/20/2022 101 38 - 126 U/L Final  10/27/2014 119 (H) Unit/L Final    Comment:    46-116 NOTE: New Reference Range 06/13/14   08/27/2011 91 U/L Final   Alkaline phosphatase (APISO)  Date Value Ref Range Status  05/05/2022 105 37 - 153 U/L Final   ALT  Date Value Ref Range Status  06/20/2022 9 0 - 44 U/L Final   SGPT (ALT)  Date Value Ref Range Status  10/27/2014 14 U/L Final    Comment:    14-63 NOTE: New Reference Range 06/13/14    AST  Date Value Ref Range Status  06/20/2022 15 15 - 41 U/L Final  08/27/2011 11 U/L Final   SGOT(AST)  Date Value Ref Range Status  10/27/2014 16 15 - 37 Unit/L Final   BUN  Date Value Ref Range Status  07/28/2022 12 6 - 20 mg/dL Final  09/07/2017 7 6 - 24 mg/dL Final  10/27/2014 10 7 - 18 mg/dL Final   Calcium  Date Value Ref Range Status  07/28/2022 8.7 (L) 8.9 - 10.3 mg/dL Final  08/27/2011 9.9 mg/dL Final   Calcium, Total  Date Value Ref Range Status  10/27/2014 8.9 8.5 - 10.1 mg/dL Final   Calcium, Ion  Date Value Ref Range Status  03/18/2011 1.16 1.12 - 1.32 mmol/L Final   CO2  Date Value Ref Range Status  07/28/2022 25 22 - 32 mmol/L Final  08/27/2011 25 mmol/L Final   Co2  Date Value Ref Range Status  10/27/2014 29 21 - 32 mmol/L Final   TCO2  Date Value Ref Range Status  03/18/2011 24 0 - 100 mmol/L Final   Creat  Date Value Ref Range Status  05/05/2022 0.95 0.50 - 1.05 mg/dL Final   Creatinine, Ser  Date Value Ref Range Status  07/28/2022 1.04 (H) 0.44 - 1.00 mg/dL Final   Creatinine,U  Date Value Ref Range Status  07/08/2010 111.9 mg/dL Final    Comment:    See lab report for associated comment(s)   Glucose  Date Value Ref Range Status  10/27/2014 94 65 - 99 mg/dL Final  08/27/2011 116 mg/dL Final   Glucose, Bld  Date Value Ref Range Status  07/28/2022 106 (H) 70 - 99 mg/dL Final      Comment:    Glucose reference range applies only to samples taken after fasting for at least 8  hours.   Glucose-Capillary  Date Value Ref Range Status  06/20/2022 109 (H) 70 - 99 mg/dL Final    Comment:    Glucose reference range applies only to samples taken after fasting for at least 8 hours.   Potassium  Date Value Ref Range Status  07/28/2022 3.6 3.5 - 5.1 mmol/L Final  10/27/2014 4.2 3.5 - 5.1 mmol/L Final  08/27/2011 4.4 mmol/L Final   Sodium  Date Value Ref Range Status  07/28/2022 140 135 - 145 mmol/L Final  09/07/2017 142 134 - 144 mmol/L Final  10/27/2014 144 136 - 145 mmol/L Final   Total Bilirubin  Date Value Ref Range Status  06/20/2022 0.4 0.3 - 1.2 mg/dL Final  08/27/2011 0.5 mg/dL Final   Bilirubin,Total  Date Value Ref Range Status  10/27/2014 0.4 0.2 - 1.0 mg/dL Final   Bilirubin Total  Date Value Ref Range Status  09/07/2017 <0.2 0.0 - 1.2 mg/dL Final   Bilirubin, Direct  Date Value Ref Range Status  11/07/2013 <0.1 0.0 - 0.3 mg/dL Final   Indirect Bilirubin  Date Value Ref Range Status  11/07/2013 NOT CALCULATED 0.3 - 0.9 mg/dL Final   Protein, ur  Date Value Ref Range Status  07/28/2022 30 (A) NEGATIVE mg/dL Final   Total Protein  Date Value Ref Range Status  06/20/2022 6.0 (L) 6.5 - 8.1 g/dL Final  09/07/2017 6.1 6.0 - 8.5 g/dL Final  10/27/2014 5.9 (L) 6.4 - 8.2 g/dL Final   Total Protein ELP  Date Value Ref Range Status  08/27/2011 6.6  Final   GFR, Est African American  Date Value Ref Range Status  05/14/2020 68 > OR = 60 mL/min/1.73m2 Final   eGFR  Date Value Ref Range Status  05/05/2022 69 > OR = 60 mL/min/1.73m2 Final    Comment:    The eGFR is based on the CKD-EPI 2021 equation. To calculate  the new eGFR from a previous Creatinine or Cystatin C result, go to https://www.kidney.org/professionals/ kdoqi/gfr%5Fcalculator    GFR, Est Non African American  Date Value Ref Range Status  05/14/2020 58 (L) > OR = 60 mL/min/1.73m2 Final   GFR, Estimated  Date Value Ref Range Status  07/28/2022 >60 >60 mL/min Final     Comment:    (NOTE) Calculated using the CKD-EPI Creatinine Equation (2021)             Requested Prescriptions  Pending Prescriptions Disp Refills   OLANZapine (ZYPREXA) 10 MG tablet [Pharmacy Med Name: OLANZAPINE 10MG TABLETS] 30 tablet 0    Sig: TAKE (1) TABLET BY MOUTH AT BEDTIME.     Not Delegated - Psychiatry:  Antipsychotics - Second Generation (Atypical) - olanzapine Failed - 08/15/2022  8:38 AM      Failed - This refill cannot be delegated      Failed - Last BP in normal range    BP Readings from Last 1 Encounters:  07/28/22 (!) 149/67         Failed - Valid encounter within last 6 months    Recent Outpatient Visits           1 year ago Other specified hypothyroidism   [facility] Pickard, Warren T, MD   1 year ago Bipolar I disorder (HCC)   [facility] Pickard, Warren T, MD   2 years ago Other specified hypothyroidism   [facility] Pickard, Warren T, MD   3 years ago Chest pain due to gastrointestinal reflux disease   [facility] Pickard, Warren T, MD   4 years ago Hypothyroidism, unspecified type   [facility] Pickard, Warren T, MD              Failed - Lipid Panel in normal range within the last 12 months    Cholesterol  Date Value Ref Range Status  07/18/2021 144 <200 mg/dL Final   LDL Cholesterol (Calc)  Date Value Ref Range Status  07/18/2021 78 mg/dL (calc) Final    Comment:    Reference range: <100 . Desirable range <100 mg/dL for primary prevention;   <70 mg/dL for patients with CHD or diabetic patients  with > or = 2 CHD risk factors. . LDL-C is now calculated using the Martin-Hopkins  calculation, which is a validated novel method providing  better accuracy than the Friedewald equation in the  estimation of LDL-C.  Martin SS et al. JAMA. 2013;310(19): 2061-2068  (http://education.[facility].com/faq/FAQ164)    HDL  Date Value Ref Range  Status  07/18/2021 45 (L) > OR = 50 mg/dL Final   Triglycerides  Date Value Ref Range Status  07/18/2021 133 <150 mg/dL Final         Passed - TSH in normal range and within 360 days    TSH  Date Value Ref Range Status  05/05/2022 2.73 0.40 - 4.50 mIU/L Final         Passed - Completed PHQ-2 or PHQ-9 in the last 360 days      Passed - Last Heart Rate in normal range    Pulse Readings from Last 1 Encounters:  07/28/22 77         Passed - CBC within normal limits and completed in the last 12 months    WBC  Date Value Ref Range Status  07/28/2022 8.0 4.0 - 10.5 K/uL Final   RBC  Date Value Ref Range Status  07/28/2022 4.97 3.87 - 5.11 MIL/uL Final   Hemoglobin  Date Value Ref Range Status  07/28/2022 12.2 12.0 - 15.0 g/dL Final  08/25/2016 13.0 11.1 - 15.9 g/dL Final   HCT  Date Value Ref Range Status  07/28/2022 39.9 36.0 - 46.0 % Final  09/18/2011 45 % Final   Hematocrit  Date Value Ref Range Status  08/25/2016 39.9 34.0 - 46.6 % Final   MCHC  Date Value Ref Range Status  07/28/2022 30.6 30.0 - 36.0 g/dL Final   MCH  Date Value Ref Range Status  07/28/2022 24.5 (L) 26.0 - 34.0 pg Final   MCV  Date Value Ref Range Status  07/28/2022 80.3 80.0 - 100.0 fL Final  08/25/2016 88 79 - 97 fL Final  10/27/2014 87 80 - 100 fL Final  09/18/2011 103.7 fL Final   No results found for: "PLTCOUNTKUC", "LABPLAT", "POCPLA" RDW  Date Value Ref Range Status  07/28/2022 17.9 (H) 11.5 - 15.5 % Final  08/25/2016 14.4 12.3 - 15.4 % Final  10/27/2014 14.2 11.5 - 14.5 % Final         Passed - CMP within normal limits and completed in the last 12 months    Albumin  Date Value Ref Range Status  06/20/2022 3.3 (L) 3.5 - 5.0 g/dL Final  09/07/2017 4.1 3.5 - 5.5 g/dL Final  10/27/2014 3.3 (L) 3.4 - 5.0 g/dL Final   Alkaline Phosphatase  Date Value Ref Range Status    06/20/2022 101 38 - 126 U/L Final  10/27/2014 119 (H) Unit/L Final    Comment:    46-116 NOTE: New  Reference Range 06/13/14   08/27/2011 91 U/L Final   Alkaline phosphatase (APISO)  Date Value Ref Range Status  05/05/2022 105 37 - 153 U/L Final   ALT  Date Value Ref Range Status  06/20/2022 9 0 - 44 U/L Final   SGPT (ALT)  Date Value Ref Range Status  10/27/2014 14 U/L Final    Comment:    14-63 NOTE: New Reference Range 06/13/14    AST  Date Value Ref Range Status  06/20/2022 15 15 - 41 U/L Final  08/27/2011 11 U/L Final   SGOT(AST)  Date Value Ref Range Status  10/27/2014 16 15 - 37 Unit/L Final   BUN  Date Value Ref Range Status  07/28/2022 12 6 - 20 mg/dL Final  09/07/2017 7 6 - 24 mg/dL Final  10/27/2014 10 7 - 18 mg/dL Final   Calcium  Date Value Ref Range Status  07/28/2022 8.7 (L) 8.9 - 10.3 mg/dL Final  08/27/2011 9.9 mg/dL Final   Calcium, Total  Date Value Ref Range Status  10/27/2014 8.9 8.5 - 10.1 mg/dL Final   Calcium, Ion  Date Value Ref Range Status  03/18/2011 1.16 1.12 - 1.32 mmol/L Final   CO2  Date Value Ref Range Status  07/28/2022 25 22 - 32 mmol/L Final  08/27/2011 25 mmol/L Final   Co2  Date Value Ref Range Status  10/27/2014 29 21 - 32 mmol/L Final   TCO2  Date Value Ref Range Status  03/18/2011 24 0 - 100 mmol/L Final   Creat  Date Value Ref Range Status  05/05/2022 0.95 0.50 - 1.05 mg/dL Final   Creatinine, Ser  Date Value Ref Range Status  07/28/2022 1.04 (H) 0.44 - 1.00 mg/dL Final   Creatinine,U  Date Value Ref Range Status  07/08/2010 111.9 mg/dL Final    Comment:    See lab report for associated comment(s)   Glucose  Date Value Ref Range Status  10/27/2014 94 65 - 99 mg/dL Final  08/27/2011 116 mg/dL Final   Glucose, Bld  Date Value Ref Range Status  07/28/2022 106 (H) 70 - 99 mg/dL Final    Comment:    Glucose reference range applies only to samples taken after fasting for at least 8 hours.   Glucose-Capillary  Date Value Ref Range Status  06/20/2022 109 (H) 70 - 99 mg/dL Final    Comment:     Glucose reference range applies only to samples taken after fasting for at least 8 hours.   Potassium  Date Value Ref Range Status  07/28/2022 3.6 3.5 - 5.1 mmol/L Final  10/27/2014 4.2 3.5 - 5.1 mmol/L Final  08/27/2011 4.4 mmol/L Final   Sodium  Date Value Ref Range Status  07/28/2022 140 135 - 145 mmol/L Final  09/07/2017 142 134 - 144 mmol/L Final  10/27/2014 144 136 - 145 mmol/L Final   Total Bilirubin  Date Value Ref Range Status  06/20/2022 0.4 0.3 - 1.2 mg/dL Final  08/27/2011 0.5 mg/dL Final   Bilirubin,Total  Date Value Ref Range Status  10/27/2014 0.4 0.2 - 1.0 mg/dL Final   Bilirubin Total  Date Value Ref Range Status  09/07/2017 <0.2 0.0 - 1.2 mg/dL Final   Bilirubin, Direct  Date Value Ref Range Status  11/07/2013 <0.1 0.0 - 0.3 mg/dL Final   Indirect Bilirubin  Date Value Ref Range   Status  11/07/2013 NOT CALCULATED 0.3 - 0.9 mg/dL Final   Protein, ur  Date Value Ref Range Status  07/28/2022 30 (A) NEGATIVE mg/dL Final   Total Protein  Date Value Ref Range Status  06/20/2022 6.0 (L) 6.5 - 8.1 g/dL Final  09/07/2017 6.1 6.0 - 8.5 g/dL Final  10/27/2014 5.9 (L) 6.4 - 8.2 g/dL Final   Total Protein ELP  Date Value Ref Range Status  08/27/2011 6.6  Final   GFR, Est African American  Date Value Ref Range Status  05/14/2020 68 > OR = 60 mL/min/1.43m Final   eGFR  Date Value Ref Range Status  05/05/2022 69 > OR = 60 mL/min/1.750mFinal    Comment:    The eGFR is based on the CKD-EPI 2021 equation. To calculate  the new eGFR from a previous Creatinine or Cystatin C result, go to https://www.kidney.org/professionals/ kdoqi/gfr%5Fcalculator    GFR, Est Non African American  Date Value Ref Range Status  05/14/2020 58 (L) > OR = 60 mL/min/1.7343minal   GFR, Estimated  Date Value Ref Range Status  07/28/2022 >60 >60 mL/min Final    Comment:    (NOTE) Calculated using the CKD-EPI Creatinine Equation (2021)

## 2022-09-17 ENCOUNTER — Other Ambulatory Visit: Payer: Self-pay | Admitting: Family Medicine

## 2022-10-03 ENCOUNTER — Other Ambulatory Visit: Payer: Self-pay | Admitting: Family Medicine

## 2022-10-03 NOTE — Telephone Encounter (Signed)
Requested medication (s) are due for refill today - yes  Requested medication (s) are on the active medication list -yes  Future visit scheduled -no  Last refill: 07/03/22 #90   Notes to clinic: fails lab protocol- over 1 year 07/18/21  Requested Prescriptions  Pending Prescriptions Disp Refills   atorvastatin (LIPITOR) 40 MG tablet [Pharmacy Med Name: ATORVASTATIN 40 MG TABLET] 90 tablet 0    Sig: TAKE (1) TABLET BY MOUTH ONCE DAILY.     Cardiovascular:  Antilipid - Statins Failed - 10/03/2022  8:48 AM      Failed - Valid encounter within last 12 months    Recent Outpatient Visits           1 year ago Other specified hypothyroidism   [place] Pickard, Cammie Mcgee, MD   1 year ago Bipolar I disorder [facility])   [place] Pickard, Cammie Mcgee, MD   2 years ago Other specified hypothyroidism   [place] Susy Frizzle, MD   3 years ago Chest pain due to gastrointestinal reflux disease   [place] Susy Frizzle, MD   4 years ago Hypothyroidism, unspecified type   [place] Susy Frizzle, MD              Failed - Lipid Panel in normal range within the last 12 months    Cholesterol  Date Value Ref Range Status  07/18/2021 144 <200 mg/dL Final   LDL Cholesterol (Calc)  Date Value Ref Range Status  07/18/2021 78 mg/dL (calc) Final    Comment:    Reference range: <100 . Desirable range <100 mg/dL for primary prevention;   <70 mg/dL for patients with CHD or diabetic patients  with > or = 2 CHD risk factors. Marland Kitchen LDL-C is now calculated using the Martin-Hopkins  calculation, which is a validated novel method providing  better accuracy than the Friedewald equation in the  estimation of LDL-C.  Cresenciano Genre et al. Annamaria Helling. 6063;016(01): 2061-2068  (http://education.[facility].com/faq/FAQ164)    HDL  Date Value Ref Range Status  07/18/2021 45 (L) > OR = 50 mg/dL Final    Triglycerides  Date Value Ref Range Status  07/18/2021 133 <150 mg/dL Final         Passed - Patient is not pregnant         Requested Prescriptions  Pending Prescriptions Disp Refills   atorvastatin (LIPITOR) 40 MG tablet [Pharmacy Med Name: ATORVASTATIN 40 MG TABLET] 90 tablet 0    Sig: TAKE (1) TABLET BY MOUTH ONCE DAILY.     Cardiovascular:  Antilipid - Statins Failed - 10/03/2022  8:48 AM      Failed - Valid encounter within last 12 months    Recent Outpatient Visits           1 year ago Other specified hypothyroidism   [place] Pickard, Cammie Mcgee, MD   1 year ago Bipolar I disorder [facility])   [place] Pickard, Cammie Mcgee, MD   2 years ago Other specified hypothyroidism   [place] Susy Frizzle, MD   3 years ago Chest pain due to gastrointestinal reflux disease   [place] Susy Frizzle, MD   4 years ago Hypothyroidism, unspecified type   [place] Pickard, Cammie Mcgee, MD              Failed - Lipid Panel in  normal range within the last 12 months    Cholesterol  Date Value Ref Range Status  07/18/2021 144 <200 mg/dL Final   LDL Cholesterol (Calc)  Date Value Ref Range Status  07/18/2021 78 mg/dL (calc) Final    Comment:    Reference range: <100 . Desirable range <100 mg/dL for primary prevention;   <70 mg/dL for patients with CHD or diabetic patients  with > or = 2 CHD risk factors. Marland Kitchen LDL-C is now calculated using the Martin-Hopkins  calculation, which is a validated novel method providing  better accuracy than the Friedewald equation in the  estimation of LDL-C.  Cresenciano Genre et al. Annamaria Helling. 7276;184(85): 2061-2068  (http://education.[facility].com/faq/FAQ164)    HDL  Date Value Ref Range Status  07/18/2021 45 (L) > OR = 50 mg/dL Final   Triglycerides  Date Value Ref Range Status  07/18/2021 133 <150 mg/dL Final         Passed - Patient is  not pregnant

## 2022-10-14 ENCOUNTER — Other Ambulatory Visit: Payer: Self-pay | Admitting: Family Medicine

## 2022-10-14 NOTE — Telephone Encounter (Signed)
Refilled 10/14/2022 - #[id number] 0 rf. Requested Prescriptions  Pending Prescriptions Disp Refills   levothyroxine (SYNTHROID) 50 MCG tablet [Pharmacy Med Name: LEVOTHYROXINE 50 MCG TABLET] 90 tablet 0    Sig: TAKE ONE TABLET BY MOUTH ONCE DAILY.     Endocrinology:  Hypothyroid Agents Failed - 10/14/2022  5:16 PM      Failed - Valid encounter within last 12 months    Recent Outpatient Visits           1 year ago Other specified hypothyroidism   [place] Pickard, Cammie Mcgee, MD   1 year ago Bipolar I disorder [facility])   [place] Pickard, Cammie Mcgee, MD   2 years ago Other specified hypothyroidism   [place] Susy Frizzle, MD   3 years ago Chest pain due to gastrointestinal reflux disease   [place] Susy Frizzle, MD   4 years ago Hypothyroidism, unspecified type   [place] Pickard, Cammie Mcgee, MD              Passed - TSH in normal range and within 360 days    TSH  Date Value Ref Range Status  05/05/2022 2.73 0.40 - 4.50 mIU/L Final

## 2022-10-15 ENCOUNTER — Ambulatory Visit: Payer: Medicaid Other | Admitting: Neurology

## 2022-10-20 ENCOUNTER — Other Ambulatory Visit: Payer: Self-pay | Admitting: Family Medicine

## 2022-10-30 ENCOUNTER — Other Ambulatory Visit: Payer: Self-pay | Admitting: Family Medicine

## 2022-10-30 DIAGNOSIS — K219 Gastro-esophageal reflux disease without esophagitis: Secondary | ICD-10-CM

## 2022-10-30 NOTE — Telephone Encounter (Signed)
Last OV 06/05/22.  Requested Prescriptions  Pending Prescriptions Disp Refills   omeprazole (PRILOSEC) 40 MG capsule [Pharmacy Med Name: OMEPRAZOLE DR 40 MG CAPSULE] 60 capsule 0    Sig: TAKE (1) CAPSULE BY MOUTH TWICE DAILY.     Gastroenterology: Proton Pump Inhibitors Failed - 10/30/2022  8:47 AM      Failed - Valid encounter within last 12 months    Recent Outpatient Visits           1 year ago Other specified hypothyroidism   [place] Pickard, Cammie Mcgee, MD   1 year ago Bipolar I disorder [facility])   [place] Pickard, Cammie Mcgee, MD   2 years ago Other specified hypothyroidism   [place] Susy Frizzle, MD   3 years ago Chest pain due to gastrointestinal reflux disease   [place] Susy Frizzle, MD   4 years ago Hypothyroidism, unspecified type   [place] Pickard, Cammie Mcgee, MD

## 2022-11-18 ENCOUNTER — Other Ambulatory Visit: Payer: Self-pay | Admitting: Family Medicine

## 2022-12-18 ENCOUNTER — Other Ambulatory Visit: Payer: Self-pay | Admitting: Family Medicine

## 2022-12-18 NOTE — Telephone Encounter (Signed)
Requested medications are due for refill today.  yes  Requested medications are on the active medications list.  yes  Last refill. 11/18/2022 #30 0 rf  Future visit scheduled.   no  Notes to clinic.  Refill not delegated.    Requested Prescriptions  Pending Prescriptions Disp Refills   OLANZapine (ZYPREXA) 10 MG tablet [Pharmacy Med Name: OLANZAPINE '10MG'$  TABLETS] 30 tablet 0    Sig: TAKE (1) TABLET BY MOUTH AT BEDTIME.     Not Delegated - Psychiatry:  Antipsychotics - Second Generation (Atypical) - olanzapine Failed - 12/18/2022 10:07 AM      Failed - This refill cannot be delegated      Failed - Last BP in normal range    BP Readings from Last 1 Encounters:  07/28/22 (!) 149/67         Failed - Valid encounter within last 6 months    Recent Outpatient Visits           1 year ago Other specified hypothyroidism   [place] Hall, Cammie Mcgee, MD   1 year ago Bipolar I disorder [facility])   [place] Hall, Cammie Mcgee, MD   2 years ago Other specified hypothyroidism   [place] Susy Frizzle, MD   4 years ago Chest pain due to gastrointestinal reflux disease   [place] Dennard Schaumann, Cammie Mcgee, MD   4 years ago Hypothyroidism, unspecified type   [place] Susy Frizzle, MD              Failed - Lipid Panel in normal range within the last 12 months    Cholesterol  Date Value Ref Range Status  07/18/2021 144 <200 mg/dL Final   LDL Cholesterol (Calc)  Date Value Ref Range Status  07/18/2021 78 mg/dL (calc) Final    Comment:    Reference range: <100 . Desirable range <100 mg/dL for primary prevention;   <70 mg/dL for patients with CHD or diabetic patients  with > or = 2 CHD risk factors. Marland Kitchen LDL-C is now calculated using the Martin-Hopkins  calculation, which is a validated novel method providing  better accuracy than the Friedewald equation in the  estimation of LDL-C.   Cresenciano Genre et al. Annamaria Helling. 4098;119(14): 2061-2068  (http://education.[facility].com/faq/FAQ164)    HDL  Date Value Ref Range Status  07/18/2021 45 (L) > OR = 50 mg/dL Final   Triglycerides  Date Value Ref Range Status  07/18/2021 133 <150 mg/dL Final         Passed - TSH in normal range and within 360 days    TSH  Date Value Ref Range Status  05/05/2022 2.73 0.40 - 4.50 mIU/L Final         Passed - Completed PHQ-2 or PHQ-9 in the last 360 days      Passed - Last Heart Rate in normal range    Pulse Readings from Last 1 Encounters:  07/28/22 77         Passed - CBC within normal limits and completed in the last 12 months    WBC  Date Value Ref Range Status  07/28/2022 8.0 4.0 - 10.5 K/uL Final   RBC  Date Value Ref Range Status  07/28/2022 4.97 3.87 - 5.11 MIL/uL Final   Hemoglobin  Date Value Ref Range Status  07/28/2022 12.2 12.0 - 15.0 g/dL Final  08/25/2016 13.0 11.1 - 15.9 g/dL Final   HCT  Date  Value Ref Range Status  07/28/2022 39.9 36.0 - 46.0 % Final  09/18/2011 45 % Final   Hematocrit  Date Value Ref Range Status  08/25/2016 39.9 34.0 - 46.6 % Final   MCHC  Date Value Ref Range Status  07/28/2022 30.6 30.0 - 36.0 g/dL Final   [facility]  Date Value Ref Range Status  07/28/2022 24.5 (L) 26.0 - 34.0 pg Final   MCV  Date Value Ref Range Status  07/28/2022 80.3 80.0 - 100.0 fL Final  08/25/2016 88 79 - 97 fL Final  10/27/2014 87 80 - 100 fL Final  09/18/2011 103.7 fL Final   No results found for: "PLTCOUNTKUC", "LABPLAT", "POCPLA" RDW  Date Value Ref Range Status  07/28/2022 17.9 (H) 11.5 - 15.5 % Final  08/25/2016 14.4 12.3 - 15.4 % Final  10/27/2014 14.2 11.5 - 14.5 % Final         Passed - CMP within normal limits and completed in the last 12 months    Albumin  Date Value Ref Range Status  06/20/2022 3.3 (L) 3.5 - 5.0 g/dL Final  09/07/2017 4.1 3.5 - 5.5 g/dL Final  10/27/2014 3.3 (L) 3.4 - 5.0 g/dL Final   Alkaline Phosphatase   Date Value Ref Range Status  06/20/2022 101 38 - 126 U/L Final  10/27/2014 119 (H) Unit/L Final    Comment:    46-116 NOTE: New Reference Range 06/13/14   08/27/2011 91 U/L Final   Alkaline phosphatase (APISO)  Date Value Ref Range Status  05/05/2022 105 37 - 153 U/L Final   ALT  Date Value Ref Range Status  06/20/2022 9 0 - 44 U/L Final   SGPT (ALT)  Date Value Ref Range Status  10/27/2014 14 U/L Final    Comment:    14-63 NOTE: New Reference Range 06/13/14    AST  Date Value Ref Range Status  06/20/2022 15 15 - 41 U/L Final  08/27/2011 11 U/L Final   SGOT(AST)  Date Value Ref Range Status  10/27/2014 16 15 - 37 Unit/L Final   BUN  Date Value Ref Range Status  07/28/2022 12 6 - 20 mg/dL Final  09/07/2017 7 6 - 24 mg/dL Final  10/27/2014 10 7 - 18 mg/dL Final   Calcium  Date Value Ref Range Status  07/28/2022 8.7 (L) 8.9 - 10.3 mg/dL Final  08/27/2011 9.9 mg/dL Final   Calcium, Total  Date Value Ref Range Status  10/27/2014 8.9 8.5 - 10.1 mg/dL Final   Calcium, Ion  Date Value Ref Range Status  03/18/2011 1.16 1.12 - 1.32 mmol/L Final   CO2  Date Value Ref Range Status  07/28/2022 25 22 - 32 mmol/L Final  08/27/2011 25 mmol/L Final   Co2  Date Value Ref Range Status  10/27/2014 29 21 - 32 mmol/L Final   TCO2  Date Value Ref Range Status  03/18/2011 24 0 - 100 mmol/L Final   Creat  Date Value Ref Range Status  05/05/2022 0.95 0.50 - 1.05 mg/dL Final   Creatinine, Ser  Date Value Ref Range Status  07/28/2022 1.04 (H) 0.44 - 1.00 mg/dL Final   Creatinine,U  Date Value Ref Range Status  07/08/2010 111.9 mg/dL Final    Comment:    See lab report for associated comment(s)   Glucose  Date Value Ref Range Status  10/27/2014 94 65 - 99 mg/dL Final  08/27/2011 116 mg/dL Final   Glucose, Bld  Date Value Ref Range Status  07/28/2022 106 (H)  70 - 99 mg/dL Final    Comment:    Glucose reference range applies only to samples taken after  fasting for at least 8 hours.   Glucose-Capillary  Date Value Ref Range Status  06/20/2022 109 (H) 70 - 99 mg/dL Final    Comment:    Glucose reference range applies only to samples taken after fasting for at least 8 hours.   Potassium  Date Value Ref Range Status  07/28/2022 3.6 3.5 - 5.1 mmol/L Final  10/27/2014 4.2 3.5 - 5.1 mmol/L Final  08/27/2011 4.4 mmol/L Final   Sodium  Date Value Ref Range Status  07/28/2022 140 135 - 145 mmol/L Final  09/07/2017 142 134 - 144 mmol/L Final  10/27/2014 144 136 - 145 mmol/L Final   Total Bilirubin  Date Value Ref Range Status  06/20/2022 0.4 0.3 - 1.2 mg/dL Final  08/27/2011 0.5 mg/dL Final   Bilirubin,Total  Date Value Ref Range Status  10/27/2014 0.4 0.2 - 1.0 mg/dL Final   Bilirubin Total  Date Value Ref Range Status  09/07/2017 <0.2 0.0 - 1.2 mg/dL Final   Bilirubin, Direct  Date Value Ref Range Status  11/07/2013 <0.1 0.0 - 0.3 mg/dL Final   Indirect Bilirubin  Date Value Ref Range Status  11/07/2013 NOT CALCULATED 0.3 - 0.9 mg/dL Final   Protein, ur  Date Value Ref Range Status  07/28/2022 30 (A) NEGATIVE mg/dL Final   Total Protein  Date Value Ref Range Status  06/20/2022 6.0 (L) 6.5 - 8.1 g/dL Final  09/07/2017 6.1 6.0 - 8.5 g/dL Final  10/27/2014 5.9 (L) 6.4 - 8.2 g/dL Final   Total Protein ELP  Date Value Ref Range Status  08/27/2011 6.6  Final   GFR, Est African American  Date Value Ref Range Status  05/14/2020 68 > OR = 60 mL/min/1.33m Final   eGFR  Date Value Ref Range Status  05/05/2022 69 > OR = 60 mL/min/1.785mFinal    Comment:    The eGFR is based on the CKD-EPI 2021 equation. To calculate  the new eGFR from a previous Creatinine or Cystatin C result, go to https://www.kidney.org/professionals/ kdoqi/gfr%5Fcalculator    GFR, Est Non African American  Date Value Ref Range Status  05/14/2020 58 (L) > OR = 60 mL/min/1.7361minal   GFR, Estimated  Date Value Ref Range Status  07/28/2022  >60 >60 mL/min Final    Comment:    (NOTE) Calculated using the CKD-EPI Creatinine Equation (2021)

## 2022-12-27 ENCOUNTER — Encounter (HOSPITAL_COMMUNITY): Payer: Self-pay | Admitting: *Deleted

## 2022-12-27 ENCOUNTER — Other Ambulatory Visit: Payer: Self-pay

## 2022-12-27 ENCOUNTER — Emergency Department (HOSPITAL_COMMUNITY)
Admission: EM | Admit: 2022-12-27 | Discharge: 2022-12-27 | Disposition: A | Discharge status: Home / Self Care | Payer: Medicaid Other | Attending: Student | Admitting: Student

## 2022-12-27 DIAGNOSIS — E876 Hypokalemia: Secondary | ICD-10-CM | POA: Diagnosis not present

## 2022-12-27 DIAGNOSIS — R531 Weakness: Secondary | ICD-10-CM | POA: Diagnosis present

## 2022-12-27 DIAGNOSIS — R55 Syncope and collapse: Secondary | ICD-10-CM | POA: Diagnosis not present

## 2022-12-27 DIAGNOSIS — F17210 Nicotine dependence, cigarettes, uncomplicated: Secondary | ICD-10-CM | POA: Diagnosis not present

## 2022-12-27 DIAGNOSIS — Z79899 Other long term (current) drug therapy: Secondary | ICD-10-CM

## 2022-12-27 DIAGNOSIS — T50905A Adverse effect of unspecified drugs, medicaments and biological substances, initial encounter: Secondary | ICD-10-CM | POA: Diagnosis not present

## 2022-12-27 DIAGNOSIS — Z96643 Presence of artificial hip joint, bilateral: Secondary | ICD-10-CM | POA: Insufficient documentation

## 2022-12-27 DIAGNOSIS — E860 Dehydration: Secondary | ICD-10-CM | POA: Diagnosis not present

## 2022-12-27 DIAGNOSIS — T887XXA Unspecified adverse effect of drug or medicament, initial encounter: Secondary | ICD-10-CM | POA: Diagnosis not present

## 2022-12-27 DIAGNOSIS — Z20822 Contact with and (suspected) exposure to covid-19: Secondary | ICD-10-CM | POA: Diagnosis not present

## 2022-12-27 LAB — COMPREHENSIVE METABOLIC PANEL
ALT: 11 U/L (ref 0–44)
AST: 22 U/L (ref 15–41)
Albumin: 3.2 g/dL — ABNORMAL LOW (ref 3.5–5.0)
Alkaline Phosphatase: 92 U/L (ref 38–126)
Anion gap: 16 — ABNORMAL HIGH (ref 5–15)
BUN: 9 mg/dL (ref 6–20)
CO2: 22 mmol/L (ref 22–32)
Calcium: 8.4 mg/dL — ABNORMAL LOW (ref 8.9–10.3)
Chloride: 101 mmol/L (ref 98–111)
Creatinine, Ser: 0.98 mg/dL (ref 0.44–1.00)
GFR, Estimated: >60 mL/min (ref >60)
Glucose, Bld: 135 mg/dL — ABNORMAL HIGH (ref 70–99)
Potassium: 3 mmol/L — ABNORMAL LOW (ref 3.5–5.1)
Sodium: 139 mmol/L (ref 135–145)
Total Bilirubin: 0.5 mg/dL (ref 0.3–1.2)
Total Protein: 5.9 g/dL — ABNORMAL LOW (ref 6.5–8.1)

## 2022-12-27 LAB — CBC
HCT: 41.4 % (ref 36.0–46.0)
Hemoglobin: 12.9 g/dL (ref 12.0–15.0)
MCH: 26.8 pg (ref 26.0–34.0)
MCHC: 31.2 g/dL (ref 30.0–36.0)
MCV: 85.9 fL (ref 80.0–100.0)
Platelets: 172 10*3/uL (ref 150–400)
RBC: 4.82 MIL/uL (ref 3.87–5.11)
RDW: 16.5 % — ABNORMAL HIGH (ref 11.5–15.5)
WBC: 6.1 10*3/uL (ref 4.0–10.5)
nRBC: 0 % (ref 0.0–0.2)

## 2022-12-27 LAB — TSH: TSH: 1.749 u[IU]/mL (ref 0.350–4.500)

## 2022-12-27 LAB — CBG MONITORING, ED: Glucose-Capillary: 119 mg/dL — ABNORMAL HIGH (ref 70–99)

## 2022-12-27 MED ORDER — POTASSIUM CHLORIDE CRYS ER 20 MEQ PO TBCR
40.0000 meq | EXTENDED_RELEASE_TABLET | Freq: Once | ORAL | Status: AC
  Administered 2022-12-27: 40 meq via ORAL
  Filled 2022-12-27: qty 2

## 2022-12-27 MED ORDER — MAGNESIUM OXIDE -MG SUPPLEMENT 400 (240 MG) MG PO TABS
800.0000 mg | ORAL_TABLET | Freq: Once | ORAL | Status: AC
  Administered 2022-12-27: 800 mg via ORAL
  Filled 2022-12-27: qty 2

## 2022-12-27 MED ORDER — LACTATED RINGERS IV BOLUS
1000.0000 mL | Freq: Once | INTRAVENOUS | Status: AC
  Administered 2022-12-27: 1000 mL via INTRAVENOUS

## 2022-12-27 NOTE — ED Triage Notes (Signed)
Pt states for past few days she has not been able to walk.  Pt states her legs feel like they are going to give out. + nausea, dizziness at times-denies at present.

## 2022-12-27 NOTE — ED Provider Notes (Signed)
[facility] Provider Note  CSN: [id number] Arrival date & time: 12/27/22 1959  Chief Complaint(s) Weakness  HPI Sheryl Hall is a 62 y.o. female with PMH bipolar 1 disorder, previous benzodiazepine abuse with recent discharge from [facility] rehab facility, hyperparathyroidism, previous CVA who presents emergency department for evaluation of generalized weakness and presyncope.  Patient states that ever since she was discharged from a rehab facility and has been working with a new psychiatrist, she states that she has felt generally weak with decreased motivation.  Additional history obtained from patient's sister who states that she wakes up in the morning and is just lying on the couch all day.  Patient states that when she gets up from the couch quickly she feels like she is going to pass out and today after standing up after dinner, fell to her knees prompting the ER visit today.  Patient is currently on quite a few sedating medications including trazodone, Zyprexa, Atarax and it does endorse taking Atarax 3 times a day.  She is unaware that this is an as needed medication and has religiously been taking this 3 times daily.   Past Medical History Past Medical History:  Diagnosis Date   Anxiety    Arthritis    Avascular necrosis of hip ([place])    left   Bipolar 1 disorder ([place])    Chronic left hip pain    Chronic nausea    Depression    Diverticulosis of colon    Family hx of colon cancer    age 3-father   Folate deficiency    GERD (gastroesophageal reflux disease)    Headache    Heart murmur    Hx of abnormal Pap smear    Hyperlipidemia    Hyperthyroidism    Keratosis, actinic    Low back pain 03/01/2013   MRI with multiple levels of disc bulge   Panic attacks    Polysubstance abuse ([place])    PTSD (post-traumatic stress disorder)    S/P colonoscopy 05/30/2010   LAX sphincter tone, anal papilla, left-sided diverticulosis, normal random  biopsies,, 1 polyp-TA   S/P endoscopy 05/30/2010   Dr Rourk-> non--critical Schatzki's ring, s/p 66F dilation   Seizures ([place])    Stroke ([place]) 2014   Right parietal, no deficits    Tubular adenoma of colon 05/30/2010   Next colonoscopy 05/2015   UTI (urinary tract infection)    Patient Active Problem List   Diagnosis Date Noted   Closed fracture of proximal end of right humerus with routine healing 01/01/2022   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   Avascular necrosis of femur head, right ([place]) 05/23/2015   Constipation 09/20/2014   Loose stools 08/02/2014   Postoperative anemia due to acute blood loss 04/20/2014   Avascular necrosis of femur head, left ([place]) 04/19/2014   OA (osteoarthritis) of hip 04/19/2014   Partial epilepsy with impairment of consciousness ([place]) 04/13/2014   Pain in limb 12/19/2013   History of stroke 12/19/2013   Acute confusional state 03/24/2013   Low back pain    PTSD (post-traumatic stress disorder) 03/04/2013   Bipolar disorder ([place]) 03/04/2013   Anxiety    Depression    Stroke ([place])    Hyperthyroidism    Hyperlipidemia    Folate deficiency    Hx of abnormal Pap smear    Abdominal bloating 11/05/2011   IBS (irritable bowel syndrome) 11/05/2011   Chronic diarrhea 09/18/2011   Gastroparesis 09/18/2011   Drug abuse (  [employer]) 09/18/2011   Tubular adenoma of colon 09/18/2011   Family hx of colon cancer 09/18/2011   COLONIC POLYPS, ADENOMATOUS 07/08/2010   DIZZINESS 07/08/2010   GERD 05/16/2010   WEIGHT LOSS 05/16/2010   NAUSEA WITH VOMITING 05/16/2010   Dysphagia 05/16/2010   DIARRHEA 05/16/2010   ABDOMINAL PAIN, UNSPECIFIED SITE 05/16/2010   Home Medication(s) Prior to Admission medications   Medication Sig Start Date End Date Taking? Authorizing Provider  atorvastatin (LIPITOR) 40 MG tablet TAKE (1) TABLET BY MOUTH ONCE DAILY. 10/03/22   Susy Frizzle, MD  escitalopram (LEXAPRO) 10 MG tablet Take 1 tablet (10 mg total) by mouth daily. Take with 20  mg a day 06/05/22   Susy Frizzle, MD  levETIRAcetam (KEPPRA) 1000 MG tablet Take 0.5 tablet in morning and 1.5 tablet in evening. 06/11/22   Suzzanne Cloud, NP  levothyroxine (SYNTHROID) 50 MCG tablet TAKE ONE TABLET BY MOUTH ONCE DAILY. 10/14/22   Susy Frizzle, MD  LORazepam (ATIVAN) 1 MG tablet Take 1 tablet (1 mg total) by mouth 2 (two) times daily. 06/05/22   Susy Frizzle, MD  Multiple Vitamins-Minerals (ONE-A-DAY WOMENS 50+ PO) Take 1 tablet by mouth daily.    [provider]  OLANZapine (ZYPREXA) 10 MG tablet TAKE (1) TABLET BY MOUTH AT BEDTIME. 12/18/22   Susy Frizzle, MD  omeprazole (PRILOSEC) 40 MG capsule TAKE (1) CAPSULE BY MOUTH TWICE DAILY. 10/30/22   Susy Frizzle, MD  ondansetron (ZOFRAN-ODT) 4 MG disintegrating tablet Take 1 tablet (4 mg total) by mouth every 8 (eight) hours as needed for nausea or vomiting. 0/8/14   Prince Rome, PA-C  Oxcarbazepine (TRILEPTAL) 300 MG tablet TAKE (1) TABLET BY MOUTH TWICE DAILY. Patient taking differently: Take 300 mg by mouth 2 (two) times daily. 04/22/22   Susy Frizzle, MD  valACYclovir (VALTREX) 500 MG tablet TAKE ONE TABLET BY MOUTH ONCE DAILY. 09/05/21   Susy Frizzle, MD  vortioxetine HBr (TRINTELLIX) 10 MG TABS tablet Take 1 tablet (10 mg total) by mouth daily. Patient not taking: Reported on 06/05/2022 05/05/22   Susy Frizzle, MD  LINZESS 72 MCG capsule TAKE ONE CAPSULE ORALLY EVERY MORNING BEFORE BREAKFAST. Patient taking differently: Take 72 mcg by mouth daily before breakfast.  06/08/19 02/29/20  Carlis Stable, NP  traZODone (DESYREL) 100 MG tablet TAKE (1) TABLET BY MOUTH AT BEDTIME. 08/11/19 02/29/20  Susy Frizzle, MD                                                                                                                                    Past Surgical History Past Surgical History:  Procedure Laterality Date   COLONOSCOPY     every 5 years   COLONOSCOPY  05/30/2010   RMR:lax  anal sphincter tone,anal papilla,otherwise normal/left-sided diverticula   ESOPHAGOGASTRODUODENOSCOPY  05/30/2010   GYJ:EHUDJSHFWYO'V ring/small HH otherwise normal   EXTERNAL EAR  SURGERY Left 12 years ago   skin graft from behind ear put in ear canal   JOINT REPLACEMENT     NECK SURGERY  10 years ago   SKIN CANCER DESTRUCTION     TOTAL HIP ARTHROPLASTY Left 04/19/2014   Procedure: LEFT TOTAL HIP ARTHROPLASTY ANTERIOR APPROACH;  Surgeon: [name], MD;  Location: [facility];  Service: Orthopedics;  Laterality: Left;   TOTAL HIP ARTHROPLASTY Right 05/23/2015   Procedure: RIGHT TOTAL HIP ARTHROPLASTY ANTERIOR APPROACH;  Surgeon: Gaynelle Arabian, MD;  Location: [facility];  Service: Orthopedics;  Laterality: Right;   Family History Family History  Problem Relation Age of Onset   Diabetes Mother    Hypertension Mother    Colon cancer Father 68       living    Hypertension Father    Hypertension Sister    Heart attack Brother    Seizures Maternal Aunt     Social History Social History   Tobacco Use   Smoking status: Every Day    Packs/day: 0.50    Years: 30.00    Total pack years: 15.00    Types: Cigarettes   Smokeless tobacco: Never   Tobacco comments:    trying to quit  Vaping Use   Vaping Use: Never used  Substance Use Topics   Alcohol use: No    Alcohol/week: 0.0 standard drinks of alcohol   Drug use: No    Comment: hx cocaine abuse   Allergies Patient has no known allergies.  Review of Systems Review of Systems  Constitutional:  Positive for fatigue.  Neurological:  Positive for weakness.    Physical Exam Vital Signs  I have reviewed the triage vital signs BP 135/81   Pulse 96   Temp 98 F (36.7 C)   Resp 18   Ht '5\' 6"'$  (1.676 m)   Wt 81.6 kg   SpO2 97%   BMI 29.05 kg/m   Physical Exam Vitals and nursing note reviewed.  Constitutional:      General: She is not in acute distress.    Appearance: She is well-developed.  HENT:     Head: Normocephalic  and atraumatic.  Eyes:     Conjunctiva/sclera: Conjunctivae normal.  Cardiovascular:     Rate and Rhythm: Normal rate and regular rhythm.     Heart sounds: No murmur heard. Pulmonary:     Effort: Pulmonary effort is normal. No respiratory distress.     Breath sounds: Normal breath sounds.  Abdominal:     Palpations: Abdomen is soft.     Tenderness: There is no abdominal tenderness.  Musculoskeletal:        General: No swelling.     Cervical back: Neck supple.  Skin:    General: Skin is warm and dry.     Capillary Refill: Capillary refill takes less than 2 seconds.  Neurological:     Mental Status: She is alert.  Psychiatric:        Mood and Affect: Mood normal.     ED Results and Treatments Labs (all labs ordered are listed, but only abnormal results are displayed) Labs Reviewed  CBC - Abnormal; Notable for the following components:      Result Value   RDW 16.5 (*)    All other components within normal limits  COMPREHENSIVE METABOLIC PANEL - Abnormal; Notable for the following components:   Potassium 3.0 (*)    Glucose, Bld 135 (*)    Calcium 8.4 (*)    Total  Protein 5.9 (*)    Albumin 3.2 (*)    Anion gap 16 (*)    All other components within normal limits  CBG MONITORING, ED - Abnormal; Notable for the following components:   Glucose-Capillary 119 (*)    All other components within normal limits  RESP PANEL BY RT-PCR (RSV, FLU A&B, COVID)  RVPGX2  TSH                                                                                                                          Radiology No results found.  Pertinent labs & imaging results that were available during my care of the patient were reviewed by me and considered in my medical decision making (see MDM for details).  Medications Ordered in ED Medications  lactated ringers bolus 1,000 mL (0 mLs Intravenous Stopped 12/27/22 2316)  potassium chloride SA (KLOR-CON M) CR tablet 40 mEq (40 mEq Oral Given 12/27/22 2315)   magnesium oxide (MAG-OX) tablet 800 mg (800 mg Oral Given 12/27/22 2314)                                                                                                                                     Procedures Procedures  (including critical care time)  Medical Decision Making / ED Course   This patient presents to the ED for concern of generalized fatigue, presyncope, this involves an extensive number of treatment options, and is a complaint that carries with it a high risk of complications and morbidity.  The differential diagnosis includes orthostatic presyncope, oversedation, polypharmacy, cardiogenic presyncope, hypothyroidism  MDM: Patient seen in the department for evaluation of generalized weakness and orthostatic presyncope.  Physical exam is largely unremarkable.  Laboratory evaluation with some mild hypokalemia to 3.0 which was repleted in the emergency department.  Albumin slightly low at 3.2 all of which morning the patient history of decreased p.o. intake from generalized lethargy.  Suspect there is a large element of oversedation with her current medication regimen and we will start by reducing the patient's Atarax usage.  I had a very long discussion with the patient and her sister about how this is a PRN medication only and not to be taken 3 times daily as she is currently doing as this is likely causing additional sedation.  Patient will reach out to her primary care physician for a med rec and to correlate  with her current psychiatrist.  Patient then discharged with outpatient follow-up as she is able to ambulate in the emergency department after fluid resuscitation.   Additional history obtained: -Additional history obtained from sister -External records from outside source obtained and reviewed including: Chart review including previous notes, labs, imaging, consultation notes   Lab Tests: -I ordered, reviewed, and interpreted labs.   The pertinent results include:    Labs Reviewed  CBC - Abnormal; Notable for the following components:      Result Value   RDW 16.5 (*)    All other components within normal limits  COMPREHENSIVE METABOLIC PANEL - Abnormal; Notable for the following components:   Potassium 3.0 (*)    Glucose, Bld 135 (*)    Calcium 8.4 (*)    Total Protein 5.9 (*)    Albumin 3.2 (*)    Anion gap 16 (*)    All other components within normal limits  CBG MONITORING, ED - Abnormal; Notable for the following components:   Glucose-Capillary 119 (*)    All other components within normal limits  RESP PANEL BY RT-PCR (RSV, FLU A&B, COVID)  RVPGX2  TSH      EKG   EKG Interpretation  Date/Time:  Saturday December 27 2022 20:25:18 EST Ventricular Rate:  91 PR Interval:  189 QRS Duration: 68 QT Interval:  371 QTC Calculation: 457 R Axis:   -28 Text Interpretation: Sinus rhythm Borderline left axis deviation Low voltage, precordial leads Anteroseptal infarct, old 90 Lead; [place] Confirmed by Ripley Fraise [phone]) on 12/27/2022 11:47:02 PM          Medicines ordered and prescription drug management: Meds ordered this encounter  Medications   lactated ringers bolus 1,000 mL   potassium chloride SA (KLOR-CON M) CR tablet 40 mEq   magnesium oxide (MAG-OX) tablet 800 mg    -I have reviewed the patients home medicines and have made adjustments as needed  Critical interventions none    Cardiac Monitoring: The patient was maintained on a cardiac monitor.  I personally viewed and interpreted the cardiac monitored which showed an underlying rhythm of: NSR  Social Determinants of Health:  Factors impacting patients care include: I had a long discussion about patient's medication regimen, patient recently out of rehab   Reevaluation: After the interventions noted above, I reevaluated the patient and found that they have : Improved  Co morbidities that complicate the patient evaluation  Past Medical History:  Diagnosis  Date   Anxiety    Arthritis    Avascular necrosis of hip ([place])    left   Bipolar 1 disorder ([place])    Chronic left hip pain    Chronic nausea    Depression    Diverticulosis of colon    Family hx of colon cancer    age 46-father   Folate deficiency    GERD (gastroesophageal reflux disease)    Headache    Heart murmur    Hx of abnormal Pap smear    Hyperlipidemia    Hyperthyroidism    Keratosis, actinic    Low back pain 03/01/2013   MRI with multiple levels of disc bulge   Panic attacks    Polysubstance abuse ([place])    PTSD (post-traumatic stress disorder)    S/P colonoscopy 05/30/2010   LAX sphincter tone, anal papilla, left-sided diverticulosis, normal random biopsies,, 1 polyp-TA   S/P endoscopy 05/30/2010   Dr Rourk-> non--critical Schatzki's ring, s/p 80F dilation   Seizures ([place])  Stroke [facility]) 2014   Right parietal, no deficits    Tubular adenoma of colon 05/30/2010   Next colonoscopy 05/2015   UTI (urinary tract infection)       Dispostion: I considered admission for this patient, but at this time she does not meet inpatient criteria for admission she is safe for discharge the patient follow-up     Final Clinical Impression(s) / ED Diagnoses Final diagnoses:  Weakness  Polypharmacy  Dehydration  Hypokalemia     '@PCDICTATION'$ @    Teressa Lower, MD 12/28/22 1521

## 2022-12-29 ENCOUNTER — Telehealth: Payer: Self-pay

## 2022-12-29 LAB — RESP PANEL BY RT-PCR (RSV, FLU A&B, COVID)  RVPGX2
Influenza A by PCR: NEGATIVE
Influenza B by PCR: NEGATIVE
Resp Syncytial Virus by PCR: NEGATIVE
SARS Coronavirus 2 by RT PCR: NEGATIVE

## 2022-12-29 NOTE — Telephone Encounter (Signed)
Transition Care Management Follow-up Telephone Call Date of discharge and from where: [facility] 12-27-22 Dx: weakness How have you been since you were released from the hospital? Still feeling weak in legs and nauseated  Any questions or concerns? No  Items Reviewed: Did the pt receive and understand the discharge instructions provided? Yes  Medications obtained and verified? Yes  Other? No  Any new allergies since your discharge? No  Dietary orders reviewed? Yes Do you have support at home? Yes   Home Care and Equipment/Supplies: Were home health services ordered? no If so, what is the name of the agency? na  Has the agency set up a time to come to the patient's home? not applicable Were any new equipment or medical supplies ordered?  No What is the name of the medical supply agency? na Were you able to get the supplies/equipment? not applicable Do you have any questions related to the use of the equipment or supplies? No  Functional Questionnaire: (I = Independent and D = Dependent) ADLs: I  Bathing/Dressing- I  Meal Prep- I  Eating- I  Maintaining continence- I  Transferring/Ambulation- I  Managing Meds- I  Follow up appointments reviewed:  PCP Hospital f/u appt confirmed? Yes  Scheduled to see Dr Dennard Schaumann on 01-02-23 @ [facility] f/u appt confirmed? No . Are transportation arrangements needed? No  If their condition worsens, is the pt aware to call PCP or go to the Emergency Dept.? Yes Was the patient provided with contact information for the PCP's office or ED? Yes Was to pt encouraged to call back with questions or concerns? Yes    Juanda Crumble LPN [place] Direct Dial [phone]

## 2023-01-01 ENCOUNTER — Other Ambulatory Visit: Payer: Self-pay | Admitting: Family Medicine

## 2023-01-01 DIAGNOSIS — K219 Gastro-esophageal reflux disease without esophagitis: Secondary | ICD-10-CM

## 2023-01-02 ENCOUNTER — Ambulatory Visit (INDEPENDENT_AMBULATORY_CARE_PROVIDER_SITE_OTHER): Payer: Medicaid Other | Admitting: Family Medicine

## 2023-01-02 ENCOUNTER — Encounter: Payer: Self-pay | Admitting: Family Medicine

## 2023-01-02 VITALS — BP 112/56 | HR 107 | Temp 97.9°F | Ht 67.0 in | Wt 167.0 lb

## 2023-01-02 DIAGNOSIS — F319 Bipolar disorder, unspecified: Secondary | ICD-10-CM

## 2023-01-02 DIAGNOSIS — E876 Hypokalemia: Secondary | ICD-10-CM

## 2023-01-02 DIAGNOSIS — E860 Dehydration: Secondary | ICD-10-CM | POA: Diagnosis not present

## 2023-01-02 MED ORDER — ESCITALOPRAM OXALATE 20 MG PO TABS: 20.0000 mg | ORAL_TABLET | Freq: Every day | ORAL | 3 refills | Status: DC

## 2023-01-02 MED ORDER — TRAZODONE HCL 100 MG PO TABS: 100.0000 mg | ORAL_TABLET | Freq: Every day | ORAL | 3 refills | Status: DC

## 2023-01-02 NOTE — Progress Notes (Signed)
Subjective:    Patient ID: Sheryl Hall, female    DOB: 1961-10-05, 62 y.o.   MRN: [id number] ER note 12/27/22: Sheryl Hall is a 62 y.o. female with PMH bipolar 1 disorder, previous benzodiazepine abuse with recent discharge from [facility] rehab facility, hyperparathyroidism, previous CVA who presents emergency department for evaluation of generalized weakness and presyncope.  Patient states that ever since she was discharged from a rehab facility and has been working with a new psychiatrist, she states that she has felt generally weak with decreased motivation.  Additional history obtained from patient's sister who states that she wakes up in the morning and is just lying on the couch all day.  Patient states that when she gets up from the couch quickly she feels like she is going to pass out and today after standing up after dinner, fell to her knees prompting the ER visit today.  Patient is currently on quite a few sedating medications including trazodone, Zyprexa, Atarax and it does endorse taking Atarax 3 times a day.  She is unaware that this is an as needed medication and has religiously been taking this 3 times daily.  MDM: Patient seen in the department for evaluation of generalized weakness and orthostatic presyncope.  Physical exam is largely unremarkable.  Laboratory evaluation with some mild hypokalemia to 3.0 which was repleted in the emergency department.  Albumin slightly low at 3.2 all of which morning the patient history of decreased p.o. intake from generalized lethargy.  Suspect there is a large element of oversedation with her current medication regimen and we will start by reducing the patient's Atarax usage.  I had a very long discussion with the patient and her sister about how this is a PRN medication only and not to be taken 3 times daily as she is currently doing as this is likely causing additional sedation.  Patient will reach out to her primary care physician for a med rec and  to correlate with her current psychiatrist.  Patient then discharged with outpatient follow-up as she is able to ambulate in the emergency department after fluid resuscitation.  01/02/23 Admission on 12/27/2022, Discharged on 12/27/2022  Component Date Value Ref Range Status   WBC 12/27/2022 6.1  4.0 - 10.5 K/uL Final   RBC 12/27/2022 4.82  3.87 - 5.11 MIL/uL Final   Hemoglobin 12/27/2022 12.9  12.0 - 15.0 g/dL Final   HCT 12/27/2022 41.4  36.0 - 46.0 % Final   MCV 12/27/2022 85.9  80.0 - 100.0 fL Final   MCH 12/27/2022 26.8  26.0 - 34.0 pg Final   MCHC 12/27/2022 31.2  30.0 - 36.0 g/dL Final   RDW 12/27/2022 16.5 (H)  11.5 - 15.5 % Final   Platelets 12/27/2022 172  150 - 400 K/uL Final   nRBC 12/27/2022 0.0  0.0 - 0.2 % Final   Performed at [facility], [address] [place], [place] [address]   Glucose-Capillary 12/27/2022 119 (H)  70 - 99 mg/dL Final   Glucose reference range applies only to samples taken after fasting for at least 8 hours.   Sodium 12/27/2022 139  135 - 145 mmol/L Final   Potassium 12/27/2022 3.0 (L)  3.5 - 5.1 mmol/L Final   Chloride 12/27/2022 101  98 - 111 mmol/L Final   CO2 12/27/2022 22  22 - 32 mmol/L Final   Glucose, Bld 12/27/2022 135 (H)  70 - 99 mg/dL Final   Glucose reference range applies only to samples taken  after fasting for at least 8 hours.   BUN 12/27/2022 9  6 - 20 mg/dL Final   Creatinine, Ser 12/27/2022 0.98  0.44 - 1.00 mg/dL Final   Calcium 12/27/2022 8.4 (L)  8.9 - 10.3 mg/dL Final   Total Protein 12/27/2022 5.9 (L)  6.5 - 8.1 g/dL Final   Albumin 12/27/2022 3.2 (L)  3.5 - 5.0 g/dL Final   AST 12/27/2022 22  15 - 41 U/L Final   ALT 12/27/2022 11  0 - 44 U/L Final   Alkaline Phosphatase 12/27/2022 92  38 - 126 U/L Final   Total Bilirubin 12/27/2022 0.5  0.3 - 1.2 mg/dL Final   GFR, Estimated 12/27/2022 >60  >60 mL/min Final   Comment: (NOTE) Calculated using the CKD-EPI Creatinine Equation (2021)    Anion gap 12/27/2022 16 (H)  5 - 15  Final   Performed at [facility], [address], [place], [place] [address]   SARS Coronavirus 2 by RT PCR 12/27/2022 NEGATIVE  NEGATIVE Final   Comment: (NOTE) SARS-CoV-2 target nucleic acids are NOT DETECTED.  The SARS-CoV-2 RNA is generally detectable in upper respiratory specimens during the acute phase of infection. The lowest concentration of SARS-CoV-2 viral copies this assay can detect is 138 copies/mL. A negative result does not preclude SARS-Cov-2 infection and should not be used as the sole basis for treatment or other patient management decisions. A negative result may occur with  improper specimen collection/handling, submission of specimen other than nasopharyngeal swab, presence of viral mutation(s) within the areas targeted by this assay, and inadequate number of viral copies(<138 copies/mL). A negative result must be combined with clinical observations, patient history, and epidemiological information. The expected result is Negative.  Fact Sheet for Patients:  EntrepreneurPulse.com.au  Fact Sheet for Healthcare Providers:  IncredibleEmployment.be  This test is no                          t yet approved or cleared by the [place] FDA and  has been authorized for detection and/or diagnosis of SARS-CoV-2 by FDA under an Emergency Use Authorization (EUA). This EUA will remain  in effect (meaning this test can be used) for the duration of the COVID-19 declaration under Section 564(b)(1) of the Act, 21 U.S.C.section 360bbb-3(b)(1), unless the authorization is terminated  or revoked sooner.       Influenza A by PCR 12/27/2022 NEGATIVE  NEGATIVE Final   Influenza B by PCR 12/27/2022 NEGATIVE  NEGATIVE Final   Comment: (NOTE) The Xpert Xpress SARS-CoV-2/FLU/RSV plus assay is intended as an aid in the diagnosis of influenza from Nasopharyngeal swab specimens and should not be used as a sole basis for treatment. Nasal washings  and aspirates are unacceptable for Xpert Xpress SARS-CoV-2/FLU/RSV testing.  Fact Sheet for Patients: EntrepreneurPulse.com.au  Fact Sheet for Healthcare Providers: IncredibleEmployment.be  This test is not yet approved or cleared by the [place] FDA and has been authorized for detection and/or diagnosis of SARS-CoV-2 by FDA under an Emergency Use Authorization (EUA). This EUA will remain in effect (meaning this test can be used) for the duration of the COVID-19 declaration under Section 564(b)(1) of the Act, 21 U.S.C. section 360bbb-3(b)(1), unless the authorization is terminated or revoked.     Resp Syncytial Virus by PCR 12/27/2022 NEGATIVE  NEGATIVE Final   Comment: (NOTE) Fact Sheet for Patients: EntrepreneurPulse.com.au  Fact Sheet for Healthcare Providers: IncredibleEmployment.be  This test is not yet approved or cleared by  the [employer] and has been authorized for detection and/or diagnosis of SARS-CoV-2 by FDA under an Emergency Use Authorization (EUA). This EUA will remain in effect (meaning this test can be used) for the duration of the COVID-19 declaration under Section 564(b)(1) of the Act, 21 U.S.C. section 360bbb-3(b)(1), unless the authorization is terminated or revoked.  Performed at [facility], [address] [place], [place] [address]    TSH 12/27/2022 1.749  0.350 - 4.500 uIU/mL Final   Comment: Performed by a 3rd Generation assay with a functional sensitivity of <=0.01 uIU/mL. Performed at [facility], [address] [place], [place] [address]    She has not followed up with her psychiatrist.  She is not taking her trazodone.  She wants to switch back to Lexapro because she felt that that was working better than Zoloft.  She is not eating and drinking enough.  She is lost weight.  She continues to report muscle weakness in both upper and lower extremities.  She works  feel it and dizzy when stands up.  She denies any syncope.  Today she is clearly dehydrated.  Her mucous membranes are dry.  Her conjunctiva are dry.  She is tachycardic.  She admits that she is not eating or drinking very much because she does not feel like the. Past Medical History:  Diagnosis Date   Anxiety    Arthritis    Avascular necrosis of hip (HCC)    left   Bipolar 1 disorder ([place])    Chronic left hip pain    Chronic nausea    Depression    Diverticulosis of colon    Family hx of colon cancer    age 48-father   Folate deficiency    GERD (gastroesophageal reflux disease)    Headache    Heart murmur    Hx of abnormal Pap smear    Hyperlipidemia    Hyperthyroidism    Keratosis, actinic    Low back pain 03/01/2013   MRI with multiple levels of disc bulge   Panic attacks    Polysubstance abuse (HCC)    PTSD (post-traumatic stress disorder)    S/P colonoscopy 05/30/2010   LAX sphincter tone, anal papilla, left-sided diverticulosis, normal random biopsies,, 1 polyp-TA   S/P endoscopy 05/30/2010   Dr Rourk-> non--critical Schatzki's ring, s/p 56F dilation   Seizures ([place])    Stroke ([place]) 2014   Right parietal, no deficits    Tubular adenoma of colon 05/30/2010   Next colonoscopy 05/2015   UTI (urinary tract infection)    Past Surgical History:  Procedure Laterality Date   COLONOSCOPY     every 5 years   COLONOSCOPY  05/30/2010   RMR:lax anal sphincter tone,anal papilla,otherwise normal/left-sided diverticula   ESOPHAGOGASTRODUODENOSCOPY  05/30/2010   [id number] ring/small HH otherwise normal   EXTERNAL EAR SURGERY Left 12 years ago   skin graft from behind ear put in ear canal   JOINT REPLACEMENT     NECK SURGERY  10 years ago   SKIN CANCER DESTRUCTION     TOTAL HIP ARTHROPLASTY Left 04/19/2014   Procedure: LEFT TOTAL HIP ARTHROPLASTY ANTERIOR APPROACH;  Surgeon: [name], MD;  Location: [facility];  Service: Orthopedics;  Laterality: Left;   TOTAL HIP  ARTHROPLASTY Right 05/23/2015   Procedure: RIGHT TOTAL HIP ARTHROPLASTY ANTERIOR APPROACH;  Surgeon: Gaynelle Arabian, MD;  Location: [facility];  Service: Orthopedics;  Laterality: Right;   Current Outpatient Medications on File Prior to Visit  Medication  Sig Dispense Refill   atorvastatin (LIPITOR) 40 MG tablet TAKE (1) TABLET BY MOUTH ONCE DAILY. 30 tablet 0   escitalopram (LEXAPRO) 10 MG tablet Take 1 tablet (10 mg total) by mouth daily. Take with 20 mg a day (Patient not taking: Reported on 12/29/2022) 30 tablet 5   levETIRAcetam (KEPPRA) 1000 MG tablet Take 0.5 tablet in morning and 1.5 tablet in evening. 180 tablet 3   levothyroxine (SYNTHROID) 50 MCG tablet TAKE ONE TABLET BY MOUTH ONCE DAILY. 90 tablet 0   LORazepam (ATIVAN) 1 MG tablet Take 1 tablet (1 mg total) by mouth 2 (two) times daily. (Patient not taking: Reported on 12/29/2022) 60 tablet 0   Multiple Vitamins-Minerals (ONE-A-DAY WOMENS 50+ PO) Take 1 tablet by mouth daily. (Patient not taking: Reported on 12/29/2022)     OLANZapine (ZYPREXA) 10 MG tablet TAKE (1) TABLET BY MOUTH AT BEDTIME. 30 tablet 0   omeprazole (PRILOSEC) 40 MG capsule TAKE (1) CAPSULE BY MOUTH TWICE DAILY. 60 capsule 0   ondansetron (ZOFRAN-ODT) 4 MG disintegrating tablet Take 1 tablet (4 mg total) by mouth every 8 (eight) hours as needed for nausea or vomiting. 20 tablet 0   Oxcarbazepine (TRILEPTAL) 300 MG tablet TAKE (1) TABLET BY MOUTH TWICE DAILY. (Patient not taking: Reported on 12/29/2022) 180 tablet 0   sertraline (ZOLOFT) 100 MG tablet Take 100 mg by mouth daily.     valACYclovir (VALTREX) 500 MG tablet TAKE ONE TABLET BY MOUTH ONCE DAILY. (Patient not taking: Reported on 12/29/2022) 30 tablet 0   vortioxetine HBr (TRINTELLIX) 10 MG TABS tablet Take 1 tablet (10 mg total) by mouth daily. (Patient not taking: Reported on 06/05/2022) 30 tablet 3   [DISCONTINUED] LINZESS 72 MCG capsule TAKE ONE CAPSULE ORALLY EVERY MORNING BEFORE BREAKFAST. (Patient taking differently:  Take 72 mcg by mouth daily before breakfast. ) 30 capsule 1   [DISCONTINUED] traZODone (DESYREL) 100 MG tablet TAKE (1) TABLET BY MOUTH AT BEDTIME. 30 tablet 0   No current facility-administered medications on file prior to visit.   No Known Allergies Social History   Socioeconomic History   Marital status: Divorced    Spouse name: Not on file   Number of children: 1   Years of education: Not on file   Highest education level: Not on file  Occupational History   Occupation: disabled  Tobacco Use   Smoking status: Every Day    Packs/day: 0.50    Years: 30.00    Total pack years: 15.00    Types: Cigarettes   Smokeless tobacco: Never   Tobacco comments:    trying to quit  Vaping Use   Vaping Use: Never used  Substance and Sexual Activity   Alcohol use: No    Alcohol/week: 0.0 standard drinks of alcohol   Drug use: No    Comment: hx cocaine abuse   Sexual activity: Never    Birth control/protection: None  Other Topics Concern   Not on file  Social History Narrative   Patient lives at home alone and she is single.    Disabled.   Education college education.   Right handed.   Caffeine mountain dew four daily.    Social Determinants of Health   Financial Resource Strain: Not on file  Food Insecurity: Not on file  Transportation Needs: Not on file  Physical Activity: Not on file  Stress: Not on file  Social Connections: Not on file  Intimate Partner Violence: Not on file     Review of  Systems  All other systems reviewed and are negative.      Objective:   Physical Exam Vitals reviewed.  Constitutional:      General: She is not in acute distress.    Appearance: She is well-developed. She is not diaphoretic.  HENT:     Right Ear: Tympanic membrane, ear canal and external ear normal.     Left Ear: Tympanic membrane, ear canal and external ear normal.     Nose: Nose normal.     Mouth/Throat:     Pharynx: No oropharyngeal exudate.  Eyes:     Extraocular  Movements: Extraocular movements intact.     Pupils: Pupils are equal, round, and reactive to light.  Neck:     Thyroid: No thyromegaly.     Vascular: No JVD.  Cardiovascular:     Rate and Rhythm: Normal rate and regular rhythm.     Heart sounds: Normal heart sounds. No murmur heard. Pulmonary:     Effort: Pulmonary effort is normal. No respiratory distress.     Breath sounds: Normal breath sounds. No stridor. No wheezing, rhonchi or rales.  Abdominal:     General: Bowel sounds are normal. There is no distension.     Palpations: Abdomen is soft.     Tenderness: There is no abdominal tenderness. There is no guarding or rebound.  Genitourinary:    Labia:        Right: No rash.        Left: No rash.      Vagina: Normal.     Cervix: Normal.     Uterus: Normal.      Adnexa: Right adnexa normal and left adnexa normal.  Musculoskeletal:     Cervical back: Neck supple.     Right lower leg: No edema.     Left lower leg: No edema.  Lymphadenopathy:     Cervical: No cervical adenopathy.  Skin:    Coloration: Skin is not pale.     Findings: No erythema or rash.  Neurological:     Mental Status: She is alert and oriented to person, place, and time.     Cranial Nerves: No cranial nerve deficit.     Motor: No abnormal muscle tone.     Coordination: Coordination normal.  Psychiatric:        Behavior: Behavior normal.        Thought Content: Thought content normal.        Judgment: Judgment normal.           Assessment & Plan:  Hypokalemia - Plan: BASIC METABOLIC PANEL WITH GFR, Magnesium, Magnesium  Dehydration  Bipolar I disorder ([place]) I encouraged patient to follow-up with her psychiatrist.  I feel that her depression from bipolar is causing her not to eat or drink enough.  I believe she is malnourished and dehydrated and this is causing orthostatic hypotension and dizziness as well as contributing to her hypokalemia.  Her Prilosec is likely contributing to the hypokalemia as  well.  I will repeat a BMP today as well as a magnesium level.  Encouraged her to drink copious amounts of Gatorade to rehydrate herself and also to eat potassium rich foods.  If her potassium is low we may need to maintain her on a potassium supplement.,  Consult psychiatry.  In the meantime I will refill her trazodone and I agreed to switch the patient back to Lexapro and discontinue Zoloft as she felt that this was working better until she can see  her psychiatrist.

## 2023-01-03 LAB — BASIC METABOLIC PANEL WITH GFR
BUN: 10 mg/dL (ref 7–25)
CO2: 28 mmol/L (ref 20–32)
Calcium: 9.1 mg/dL (ref 8.6–10.4)
Chloride: 101 mmol/L (ref 98–110)
Creat: 0.85 mg/dL (ref 0.50–1.05)
Glucose, Bld: 93 mg/dL (ref 65–99)
Potassium: 3.8 mmol/L (ref 3.5–5.3)
Sodium: 144 mmol/L (ref 135–146)
eGFR: 78 mL/min/{1.73_m2} (ref >=60)

## 2023-01-03 LAB — MAGNESIUM: Magnesium: 1.7 mg/dL (ref 1.5–2.5)

## 2023-01-16 ENCOUNTER — Other Ambulatory Visit: Payer: Self-pay | Admitting: Family Medicine

## 2023-01-19 ENCOUNTER — Other Ambulatory Visit: Payer: Self-pay

## 2023-01-19 DIAGNOSIS — I639 Cerebral infarction, unspecified: Secondary | ICD-10-CM

## 2023-01-29 ENCOUNTER — Ambulatory Visit (HOSPITAL_COMMUNITY): Payer: Medicaid Other

## 2023-02-02 ENCOUNTER — Other Ambulatory Visit: Payer: Self-pay | Admitting: Family Medicine

## 2023-02-02 DIAGNOSIS — K219 Gastro-esophageal reflux disease without esophagitis: Secondary | ICD-10-CM

## 2023-02-09 ENCOUNTER — Ambulatory Visit (HOSPITAL_COMMUNITY): Payer: No Typology Code available for payment source | Admitting: Psychiatry

## 2023-02-16 ENCOUNTER — Ambulatory Visit (HOSPITAL_COMMUNITY): Payer: Medicaid Other | Admitting: Physical Therapy

## 2023-02-19 ENCOUNTER — Other Ambulatory Visit: Payer: Self-pay | Admitting: Family Medicine

## 2023-03-02 ENCOUNTER — Observation Stay (HOSPITAL_COMMUNITY): Payer: Medicaid Other

## 2023-03-02 ENCOUNTER — Inpatient Hospital Stay (HOSPITAL_COMMUNITY)
Admission: EM | Admit: 2023-03-02 | Discharge: 2023-03-06 | DRG: 641 | Disposition: A | Discharge status: Skilled Nursing Facility | Payer: Medicaid Other | Attending: Internal Medicine | Admitting: Internal Medicine

## 2023-03-02 ENCOUNTER — Other Ambulatory Visit: Payer: Self-pay

## 2023-03-02 ENCOUNTER — Encounter (HOSPITAL_COMMUNITY): Payer: Self-pay | Admitting: Emergency Medicine

## 2023-03-02 DIAGNOSIS — F32A Depression, unspecified: Secondary | ICD-10-CM | POA: Diagnosis present

## 2023-03-02 DIAGNOSIS — Z79890 Hormone replacement therapy (postmenopausal): Secondary | ICD-10-CM

## 2023-03-02 DIAGNOSIS — Z8249 Family history of ischemic heart disease and other diseases of the circulatory system: Secondary | ICD-10-CM

## 2023-03-02 DIAGNOSIS — R531 Weakness: Secondary | ICD-10-CM

## 2023-03-02 DIAGNOSIS — E0590 Thyrotoxicosis, unspecified without thyrotoxic crisis or storm: Secondary | ICD-10-CM | POA: Diagnosis present

## 2023-03-02 DIAGNOSIS — R634 Abnormal weight loss: Secondary | ICD-10-CM | POA: Diagnosis present

## 2023-03-02 DIAGNOSIS — Z6827 Body mass index (BMI) 27.0-27.9, adult: Secondary | ICD-10-CM

## 2023-03-02 DIAGNOSIS — Z79899 Other long term (current) drug therapy: Secondary | ICD-10-CM

## 2023-03-02 DIAGNOSIS — I10 Essential (primary) hypertension: Secondary | ICD-10-CM | POA: Diagnosis present

## 2023-03-02 DIAGNOSIS — M25552 Pain in left hip: Secondary | ICD-10-CM | POA: Diagnosis present

## 2023-03-02 DIAGNOSIS — Z85828 Personal history of other malignant neoplasm of skin: Secondary | ICD-10-CM

## 2023-03-02 DIAGNOSIS — K529 Noninfective gastroenteritis and colitis, unspecified: Secondary | ICD-10-CM | POA: Diagnosis present

## 2023-03-02 DIAGNOSIS — E785 Hyperlipidemia, unspecified: Secondary | ICD-10-CM | POA: Diagnosis present

## 2023-03-02 DIAGNOSIS — Z833 Family history of diabetes mellitus: Secondary | ICD-10-CM

## 2023-03-02 DIAGNOSIS — E039 Hypothyroidism, unspecified: Secondary | ICD-10-CM | POA: Diagnosis present

## 2023-03-02 DIAGNOSIS — E876 Hypokalemia: Secondary | ICD-10-CM | POA: Diagnosis present

## 2023-03-02 DIAGNOSIS — Z800 Family history of malignant neoplasm of digestive organs: Secondary | ICD-10-CM

## 2023-03-02 DIAGNOSIS — Z87891 Personal history of nicotine dependence: Secondary | ICD-10-CM

## 2023-03-02 DIAGNOSIS — K219 Gastro-esophageal reflux disease without esophagitis: Secondary | ICD-10-CM | POA: Diagnosis present

## 2023-03-02 DIAGNOSIS — F4310 Post-traumatic stress disorder, unspecified: Secondary | ICD-10-CM | POA: Diagnosis present

## 2023-03-02 DIAGNOSIS — F419 Anxiety disorder, unspecified: Secondary | ICD-10-CM | POA: Diagnosis present

## 2023-03-02 DIAGNOSIS — E538 Deficiency of other specified B group vitamins: Secondary | ICD-10-CM | POA: Diagnosis present

## 2023-03-02 DIAGNOSIS — Z8673 Personal history of transient ischemic attack (TIA), and cerebral infarction without residual deficits: Secondary | ICD-10-CM

## 2023-03-02 DIAGNOSIS — Z96643 Presence of artificial hip joint, bilateral: Secondary | ICD-10-CM | POA: Diagnosis present

## 2023-03-02 DIAGNOSIS — E860 Dehydration: Principal | ICD-10-CM | POA: Diagnosis present

## 2023-03-02 DIAGNOSIS — G629 Polyneuropathy, unspecified: Secondary | ICD-10-CM | POA: Diagnosis present

## 2023-03-02 DIAGNOSIS — G8929 Other chronic pain: Secondary | ICD-10-CM | POA: Diagnosis present

## 2023-03-02 DIAGNOSIS — G40909 Epilepsy, unspecified, not intractable, without status epilepticus: Secondary | ICD-10-CM | POA: Diagnosis present

## 2023-03-02 DIAGNOSIS — R627 Adult failure to thrive: Secondary | ICD-10-CM | POA: Diagnosis present

## 2023-03-02 DIAGNOSIS — Z820 Family history of epilepsy and other diseases of the nervous system: Secondary | ICD-10-CM

## 2023-03-02 DIAGNOSIS — F319 Bipolar disorder, unspecified: Secondary | ICD-10-CM | POA: Diagnosis present

## 2023-03-02 LAB — FOLATE: Folate: 14.3 ng/mL (ref >5.9)

## 2023-03-02 LAB — CBC
HCT: 41.2 % (ref 36.0–46.0)
Hemoglobin: 13.4 g/dL (ref 12.0–15.0)
MCH: 28.5 pg (ref 26.0–34.0)
MCHC: 32.5 g/dL (ref 30.0–36.0)
MCV: 87.5 fL (ref 80.0–100.0)
Platelets: 221 10*3/uL (ref 150–400)
RBC: 4.71 MIL/uL (ref 3.87–5.11)
RDW: 17 % — ABNORMAL HIGH (ref 11.5–15.5)
WBC: 8.2 10*3/uL (ref 4.0–10.5)
nRBC: 0 % (ref 0.0–0.2)

## 2023-03-02 LAB — COMPREHENSIVE METABOLIC PANEL
ALT: 39 U/L (ref 0–44)
AST: 105 U/L — ABNORMAL HIGH (ref 15–41)
Albumin: 2.8 g/dL — ABNORMAL LOW (ref 3.5–5.0)
Alkaline Phosphatase: 62 U/L (ref 38–126)
Anion gap: 13 (ref 5–15)
BUN: 12 mg/dL (ref 6–20)
CO2: 29 mmol/L (ref 22–32)
Calcium: 8.3 mg/dL — ABNORMAL LOW (ref 8.9–10.3)
Chloride: 100 mmol/L (ref 98–111)
Creatinine, Ser: 0.78 mg/dL (ref 0.44–1.00)
GFR, Estimated: >60 mL/min (ref >60)
Glucose, Bld: 96 mg/dL (ref 70–99)
Potassium: 2.6 mmol/L — CL (ref 3.5–5.1)
Sodium: 142 mmol/L (ref 135–145)
Total Bilirubin: 0.7 mg/dL (ref 0.3–1.2)
Total Protein: 5.4 g/dL — ABNORMAL LOW (ref 6.5–8.1)

## 2023-03-02 LAB — MAGNESIUM: Magnesium: 1.5 mg/dL — ABNORMAL LOW (ref 1.7–2.4)

## 2023-03-02 LAB — TSH: TSH: 2.276 u[IU]/mL (ref 0.350–4.500)

## 2023-03-02 LAB — LIPASE, BLOOD: Lipase: 27 U/L (ref 11–51)

## 2023-03-02 LAB — VITAMIN B12: Vitamin B-12: 138 pg/mL — ABNORMAL LOW (ref 180–914)

## 2023-03-02 LAB — CBG MONITORING, ED: Glucose-Capillary: 109 mg/dL — ABNORMAL HIGH (ref 70–99)

## 2023-03-02 MED ORDER — ONDANSETRON HCL 4 MG PO TABS
4.0000 mg | ORAL_TABLET | Freq: Four times a day (QID) | ORAL | Status: DC | PRN
  Administered 2023-03-03: 4 mg via ORAL
  Filled 2023-03-02: qty 1

## 2023-03-02 MED ORDER — MAGNESIUM OXIDE -MG SUPPLEMENT 400 (240 MG) MG PO TABS
400.0000 mg | ORAL_TABLET | Freq: Once | ORAL | Status: AC
  Administered 2023-03-02: 400 mg via ORAL
  Filled 2023-03-02: qty 1

## 2023-03-02 MED ORDER — POTASSIUM CHLORIDE 10 MEQ/100ML IV SOLN
10.0000 meq | INTRAVENOUS | Status: AC
  Administered 2023-03-02 (×2): 10 meq via INTRAVENOUS
  Filled 2023-03-02 (×2): qty 100

## 2023-03-02 MED ORDER — ACETAMINOPHEN 325 MG PO TABS
650.0000 mg | ORAL_TABLET | Freq: Four times a day (QID) | ORAL | Status: DC | PRN
  Administered 2023-03-02 – 2023-03-05 (×7): 650 mg via ORAL
  Filled 2023-03-02 (×7): qty 2

## 2023-03-02 MED ORDER — POTASSIUM CHLORIDE IN NACL 40-0.9 MEQ/L-% IV SOLN
INTRAVENOUS | Status: DC
  Filled 2023-03-02 (×11): qty 1000

## 2023-03-02 MED ORDER — TRAZODONE HCL 50 MG PO TABS
100.0000 mg | ORAL_TABLET | Freq: Every day | ORAL | Status: DC
  Administered 2023-03-02: 100 mg via ORAL
  Filled 2023-03-02: qty 2

## 2023-03-02 MED ORDER — ENOXAPARIN SODIUM 40 MG/0.4ML IJ SOSY
40.0000 mg | PREFILLED_SYRINGE | INTRAMUSCULAR | Status: DC
  Administered 2023-03-02 – 2023-03-05 (×4): 40 mg via SUBCUTANEOUS
  Filled 2023-03-02 (×4): qty 0.4

## 2023-03-02 MED ORDER — GABAPENTIN 100 MG PO CAPS
100.0000 mg | ORAL_CAPSULE | Freq: Once | ORAL | Status: AC
  Administered 2023-03-02: 100 mg via ORAL
  Filled 2023-03-02: qty 1

## 2023-03-02 MED ORDER — ONDANSETRON HCL 4 MG/2ML IJ SOLN
4.0000 mg | Freq: Once | INTRAMUSCULAR | Status: AC
  Administered 2023-03-02: 4 mg via INTRAVENOUS
  Filled 2023-03-02: qty 2

## 2023-03-02 MED ORDER — ESCITALOPRAM OXALATE 10 MG PO TABS
20.0000 mg | ORAL_TABLET | Freq: Every day | ORAL | Status: DC
  Administered 2023-03-03 – 2023-03-06 (×4): 20 mg via ORAL
  Filled 2023-03-02 (×5): qty 2

## 2023-03-02 MED ORDER — SODIUM CHLORIDE 0.9 % IV BOLUS
1000.0000 mL | Freq: Once | INTRAVENOUS | Status: AC
  Administered 2023-03-02: 1000 mL via INTRAVENOUS

## 2023-03-02 MED ORDER — LEVETIRACETAM 500 MG PO TABS
1000.0000 mg | ORAL_TABLET | Freq: Two times a day (BID) | ORAL | Status: DC
  Administered 2023-03-02 – 2023-03-06 (×8): 1000 mg via ORAL
  Filled 2023-03-02 (×8): qty 2

## 2023-03-02 MED ORDER — MAGNESIUM SULFATE 2 GM/50ML IV SOLN
2.0000 g | Freq: Once | INTRAVENOUS | Status: AC
  Administered 2023-03-02: 2 g via INTRAVENOUS
  Filled 2023-03-02: qty 50

## 2023-03-02 MED ORDER — PANTOPRAZOLE SODIUM 40 MG PO TBEC
40.0000 mg | DELAYED_RELEASE_TABLET | Freq: Every day | ORAL | Status: DC
  Administered 2023-03-03 – 2023-03-06 (×4): 40 mg via ORAL
  Filled 2023-03-02 (×4): qty 1

## 2023-03-02 MED ORDER — POTASSIUM CHLORIDE CRYS ER 20 MEQ PO TBCR
40.0000 meq | EXTENDED_RELEASE_TABLET | Freq: Once | ORAL | Status: AC
  Administered 2023-03-02: 40 meq via ORAL
  Filled 2023-03-02: qty 2

## 2023-03-02 MED ORDER — ACETAMINOPHEN 650 MG RE SUPP: 650.0000 mg | Freq: Four times a day (QID) | RECTAL | Status: DC | PRN

## 2023-03-02 MED ORDER — LEVOTHYROXINE SODIUM 50 MCG PO TABS
50.0000 ug | ORAL_TABLET | Freq: Every day | ORAL | Status: DC
  Administered 2023-03-03 – 2023-03-06 (×4): 50 ug via ORAL
  Filled 2023-03-02 (×4): qty 1

## 2023-03-02 MED ORDER — ATORVASTATIN CALCIUM 40 MG PO TABS
40.0000 mg | ORAL_TABLET | Freq: Every day | ORAL | Status: DC
  Administered 2023-03-03 – 2023-03-06 (×4): 40 mg via ORAL
  Filled 2023-03-02 (×4): qty 1

## 2023-03-02 MED ORDER — ONDANSETRON HCL 4 MG/2ML IJ SOLN
4.0000 mg | Freq: Four times a day (QID) | INTRAMUSCULAR | Status: DC | PRN
  Administered 2023-03-03 – 2023-03-04 (×3): 4 mg via INTRAVENOUS
  Filled 2023-03-02 (×3): qty 2

## 2023-03-02 MED ORDER — OLANZAPINE 5 MG PO TABS
10.0000 mg | ORAL_TABLET | Freq: Every day | ORAL | Status: DC
  Administered 2023-03-02: 10 mg via ORAL
  Filled 2023-03-02: qty 2

## 2023-03-02 NOTE — ED Triage Notes (Signed)
Pt c/o gen weakness for "weeks". Nausea and feet feels like has pins and needles. Oral cavity and lips very dry. Pt pale in triage. A/o.

## 2023-03-02 NOTE — H&P (Signed)
History and Physical    Sheryl Hall:[id number] DOB: 1961/03/18 DOA: 03/02/2023  PCP: Donita Febus, MD  Patient coming from: Home  I have personally briefly reviewed patient's old medical records in [facility]  Chief Complaint: Generalized weakness/dizziness/poor appetite  HPI: Sheryl Hall is a 62 y.o. female with medical history significant of hypertension, hyperlipidemia, GERD, hypothyroidism and many other presented to ED with several complaints.  Basically she started having weakness about 3 months ago.  Her sister is at the bedside who is providing additional information as well.  According to her, patient having history of anxiety, depression as well as bipolar disorder, oftentimes does not want to get out and would be laying in the bed or in the chair for 24 hours.  Per patient, she basically started feeling weak 3 months ago.  But since about 3 to 4 weeks ago, she has had very poor appetite and she is eating only little and has been drinking almost 3 sodas a day with no water but sometimes will drink some juice.  Per sister, she is barely eating 300 cal a day.  Per patient, she has lost approximately 20 to 25 pounds in about 2 to 3 months.  She denies any chest pain, abdominal pain, shortness of breath, nausea, vomiting, any problem with urination or with bowel movements.  Per sister, she has been out of her home only once in 3 months and that 2 was for doctor's visit.  Patient claims that she takes all her medications regularly including Synthroid.  She does carry a history of GERD.  Additionally, patient is complaining of severe pain and tingling and burning in bilateral feet which started about 2 months ago.  ED Course: Upon arrival to ED, she was hemodynamically stable.  She was found to have severe hypokalemia 2.6 and severe hypomagnesemia.  But other than that, all her labs were normal.  No imaging study was done.  Review of Systems: As per HPI otherwise negative.     Past Medical History:  Diagnosis Date   Anxiety    Arthritis    Avascular necrosis of hip    left   Bipolar 1 disorder    Chronic left hip pain    Chronic nausea    Depression    Diverticulosis of colon    Family hx of colon cancer    age 61-father   Folate deficiency    GERD (gastroesophageal reflux disease)    Headache    Heart murmur    Hx of abnormal Pap smear    Hyperlipidemia    Hyperthyroidism    Keratosis, actinic    Low back pain 03/01/2013   MRI with multiple levels of disc bulge   Panic attacks    Polysubstance abuse    PTSD (post-traumatic stress disorder)    S/P colonoscopy 05/30/2010   LAX sphincter tone, anal papilla, left-sided diverticulosis, normal random biopsies,, 1 polyp-TA   S/P endoscopy 05/30/2010   Dr Rourk-> non--critical Schatzki's ring, s/p 28F dilation   Seizures    Stroke 2014   Right parietal, no deficits    Tubular adenoma of colon 05/30/2010   Next colonoscopy 05/2015   UTI (urinary tract infection)     Past Surgical History:  Procedure Laterality Date   COLONOSCOPY     every 5 years   COLONOSCOPY  05/30/2010   RMR:lax anal sphincter tone,anal papilla,otherwise normal/left-sided diverticula   ESOPHAGOGASTRODUODENOSCOPY  05/30/2010   JYN:[id number]'V ring/small HH otherwise normal  EXTERNAL EAR SURGERY Left 12 years ago   skin graft from behind ear put in ear canal   JOINT REPLACEMENT     NECK SURGERY  10 years ago   SKIN CANCER DESTRUCTION     TOTAL HIP ARTHROPLASTY Left 04/19/2014   Procedure: LEFT TOTAL HIP ARTHROPLASTY ANTERIOR APPROACH;  Surgeon: Loanne Drilling, MD;  Location: [facility];  Service: Orthopedics;  Laterality: Left;   TOTAL HIP ARTHROPLASTY Right 05/23/2015   Procedure: RIGHT TOTAL HIP ARTHROPLASTY ANTERIOR APPROACH;  Surgeon: Ollen Gross, MD;  Location: [facility];  Service: Orthopedics;  Laterality: Right;     reports that she quit smoking about 10 months ago. Her smoking use included cigarettes. She has a 15.00  pack-year smoking history. She has never used smokeless tobacco. She reports that she does not drink alcohol and does not use drugs.  No Known Allergies  Family History  Problem Relation Age of Onset   Diabetes Mother    Hypertension Mother    Colon cancer Father 82       living    Hypertension Father    Hypertension Sister    Heart attack Brother    Seizures Maternal Aunt     Prior to Admission medications   Medication Sig Start Date End Date Taking? Authorizing Provider  atorvastatin (LIPITOR) 40 MG tablet TAKE (1) TABLET BY MOUTH ONCE DAILY. 02/02/23   Donita Evelyn, MD  escitalopram (LEXAPRO) 20 MG tablet Take 1 tablet (20 mg total) by mouth daily. 01/02/23   Donita Odwyer, MD  levETIRAcetam (KEPPRA) 1000 MG tablet Take 0.5 tablet in morning and 1.5 tablet in evening. 06/11/22   Glean Salvo, NP  levothyroxine (SYNTHROID) 50 MCG tablet TAKE ONE TABLET BY MOUTH ONCE DAILY. 01/16/23   Donita Laurie, MD  Multiple Vitamins-Minerals (ONE-A-DAY WOMENS 50+ PO) Take 1 tablet by mouth daily.    [provider]  OLANZapine (ZYPREXA) 10 MG tablet TAKE (1) TABLET BY MOUTH AT BEDTIME. 02/19/23   Donita Parchment, MD  omeprazole (PRILOSEC) 40 MG capsule TAKE (1) CAPSULE BY MOUTH TWICE DAILY. 02/02/23   Donita Molyneux, MD  traZODone (DESYREL) 100 MG tablet Take 1 tablet (100 mg total) by mouth at bedtime. 01/02/23   Donita Harwood, MD  LINZESS 72 MCG capsule TAKE ONE CAPSULE ORALLY EVERY MORNING BEFORE BREAKFAST. Patient taking differently: Take 72 mcg by mouth daily before breakfast.  06/08/19 02/29/20  Anice Paganini, NP    Physical Exam: Vitals:   03/02/23 1827 03/02/23 1930 03/02/23 2100  BP: (!) 151/92 (!) 148/77 123/68  Pulse: (!) 106 90 88  Resp: 17 19 14   Temp: 98.2 F (36.8 C)    TempSrc: Oral    SpO2: 96% 97% 98%    Constitutional: NAD, calm, comfortable Vitals:   03/02/23 1827 03/02/23 1930 03/02/23 2100  BP: (!) 151/92 (!) 148/77 123/68  Pulse: (!) 106  90 88  Resp: 17 19 14   Temp: 98.2 F (36.8 C)    TempSrc: Oral    SpO2: 96% 97% 98%   Eyes: PERRL, lids and conjunctivae normal ENMT: Mucous membranes are moist. Posterior pharynx clear of any exudate or lesions.Normal dentition.  Neck: normal, supple, no masses, no thyromegaly Respiratory: clear to auscultation bilaterally, no wheezing, no crackles. Normal respiratory effort. No accessory muscle use.  Cardiovascular: Regular rate and rhythm, no murmurs / rubs / gallops. No extremity edema. 2+ pedal pulses. No carotid bruits.  Abdomen: no tenderness, no masses palpated.  No hepatosplenomegaly. Bowel sounds positive.  Musculoskeletal: no clubbing / cyanosis. No joint deformity upper and lower extremities. Good ROM, no contractures. Normal muscle tone.  Skin: no rashes, lesions, ulcers. No induration Neurologic: CN 2-12 grossly intact. Sensation intact, DTR normal. Strength 5/5 in all 4.  Psychiatric: Normal judgment and insight. Alert and oriented x 3. Normal mood.    Labs on Admission: I have personally reviewed following labs and imaging studies  CBC: Recent Labs  Lab 03/02/23 1923  WBC 8.2  HGB 13.4  HCT 41.2  MCV 87.5  PLT 221   Basic Metabolic Panel: Recent Labs  Lab 03/02/23 1923 03/02/23 1949  NA 142  --   K 2.6*  --   CL 100  --   CO2 29  --   GLUCOSE 96  --   BUN 12  --   CREATININE 0.78  --   CALCIUM 8.3*  --   MG  --  1.5*   GFR: CrCl cannot be calculated (Unknown ideal weight.). Liver Function Tests: Recent Labs  Lab 03/02/23 1923  AST 105*  ALT 39  ALKPHOS 62  BILITOT 0.7  PROT 5.4*  ALBUMIN 2.8*   Recent Labs  Lab 03/02/23 1923  LIPASE 27   No results for input(s): "AMMONIA" in the last 168 hours. Coagulation Profile: No results for input(s): "INR", "PROTIME" in the last 168 hours. Cardiac Enzymes: No results for input(s): "CKTOTAL", "CKMB", "CKMBINDEX", "TROPONINI" in the last 168 hours. BNP (last 3 results) No results for input(s):  "PROBNP" in the last 8760 hours. HbA1C: No results for input(s): "HGBA1C" in the last 72 hours. CBG: Recent Labs  Lab 03/02/23 1948  GLUCAP 109*   Lipid Profile: No results for input(s): "CHOL", "HDL", "LDLCALC", "TRIG", "CHOLHDL", "LDLDIRECT" in the last 72 hours. Thyroid Function Tests: Recent Labs    03/02/23 1949  TSH 2.276   Anemia Panel: No results for input(s): "VITAMINB12", "FOLATE", "FERRITIN", "TIBC", "IRON", "RETICCTPCT" in the last 72 hours. Urine analysis:    Component Value Date/Time   COLORURINE YELLOW 07/28/2022 1825   APPEARANCEUR HAZY (A) 07/28/2022 1825   APPEARANCEUR Clear 10/27/2014 1036   LABSPEC 1.024 07/28/2022 1825   LABSPEC 1.010 10/27/2014 1036   PHURINE 5.0 07/28/2022 1825   GLUCOSEU NEGATIVE 07/28/2022 1825   GLUCOSEU Negative 10/27/2014 1036   GLUCOSEU NEG mg/dL 16/10/960408/15/2011 54092302   HGBUR NEGATIVE 07/28/2022 1825   BILIRUBINUR NEGATIVE 07/28/2022 1825   BILIRUBINUR negative 02/29/2020 1508   BILIRUBINUR Negative 10/27/2014 1036   KETONESUR NEGATIVE 07/28/2022 1825   PROTEINUR 30 (A) 07/28/2022 1825   UROBILINOGEN 0.2 02/29/2020 1508   UROBILINOGEN 1.0 05/17/2015 1334   NITRITE NEGATIVE 07/28/2022 1825   LEUKOCYTESUR NEGATIVE 07/28/2022 1825   LEUKOCYTESUR Negative 10/27/2014 1036    Radiological Exams on Admission: No results found.  EKG: Independently reviewed.  Flattened T waves in lateral leads.  Assessment/Plan Principal Problem:   Generalized weakness Active Problems:   Anxiety   Depression   Bipolar disorder (HCC)   Hyperthyroidism   Hyperlipidemia   Hypokalemia   Hypomagnesemia   Generalized weakness/dizziness/poor appetite: This could very well have started from her depression leading to her depressed mood of not getting out of the bed, leading to further deconditioning, leading to poor appetite and further weakness due to lack of poor p.o. intake.  This would be scenario and severe depression but looking at patient, she  does not appear to be that severe depressed.  This raises concern for possible other differential diagnoses  including occult malignancy as well as hypothyroidism.  I will check her TSH but I will also proceed with CT abdomen and pelvis with oral contrast.  Hypomagnesemia and severe hypokalemia: I will give her 1 dose of IV magnesium sulfate.  I will start her on IV fluids with potassium in it.  Severe dehydration: Clinically patient appears to be significantly dehydrated.  I will start her on IV fluids.  I am very surprised to see that her renal function is normal.  Tingling and burning pain in bilateral feet: This could very well be secondary to hypothyroidism or vitamin B complex deficiency which is very well a possibility due to her poor p.o. intake.  I will check TSH, as well as all vitamin Bs as well as folate.  Will check hemoglobin A1c.  Essential hypertension: Controlled.  Resume home medications.  Hypothyroidism: Resume Synthroid, check TSH.  Bipolar disorder/anxiety and depression: Resume Lexapro, olanzapine and trazodone.  GERD: Resume PPI.  Hyper lipidemia: Resume statin.  DVT prophylaxis: enoxaparin (LOVENOX) injection 40 mg Start: 03/02/23 2200 Code Status: Full code Family Communication: Sister present at bedside.  Plan of care discussed with patient in length and he verbalized understanding and agreed with it. Disposition Plan: Potential discharge in next 2 days. Consults called: None  Hughie Closs MD [facility]  *Please note that this is a verbal dictation therefore any spelling or grammatical errors are due to the "[employer]agon [employer] One" system interpretation.  Please page via Amion and do not message via secure chat for urgent patient care matters. Secure chat can be used for non urgent patient care matters. 03/02/2023, 9:46 PM  To contact the attending provider between 7A-7P or the covering provider during after hours 7P-7A, please log into the web site  [email or web address]

## 2023-03-02 NOTE — ED Provider Notes (Signed)
[place] EMERGENCY DEPARTMENT AT [facility] Provider Note   CSN: [id number] Arrival date & time: 03/02/23  1800     History  Chief Complaint  Patient presents with   Weakness   Nausea   HPI Linward Fostermanda W Wirsing is a 62 y.o. female with h/o stroke, bipolar 1 disorder, hyperlipidemia, and hypothyroidism presenting for weakness, nausea and dizziness.  States her symptoms have been going on for at least a month now.  Started with weakness and nausea.  Her sister states that she has not been eating at all over the last 2 or 3 weeks.  Only drinks sodas and occasionally orange juice.  Patient states she does not know why her appetite so poor she just does not want to eat.  Also states she has been dizzy in the last week and has gotten progressively worse.  States her dizziness has been persistent for several days now.  It is somewhat improved going from standing to sitting.  Denies room spinning sensation but does endorse loss of balance and coordination.  States she feels very and self walking because she feels like she will fall.  Patient is did mention that she did fall couple days ago and hurt her left hand.  Denies use of blood thinners.   Weakness      Home Medications Prior to Admission medications   Medication Sig Start Date End Date Taking? Authorizing Provider  atorvastatin (LIPITOR) 40 MG tablet TAKE (1) TABLET BY MOUTH ONCE DAILY. 02/02/23   Donita BrooksPickard, Warren T, MD  escitalopram (LEXAPRO) 20 MG tablet Take 1 tablet (20 mg total) by mouth daily. 01/02/23   Donita BrooksPickard, Warren T, MD  levETIRAcetam (KEPPRA) 1000 MG tablet Take 0.5 tablet in morning and 1.5 tablet in evening. 06/11/22   Glean SalvoSlack, Sarah J, NP  levothyroxine (SYNTHROID) 50 MCG tablet TAKE ONE TABLET BY MOUTH ONCE DAILY. 01/16/23   Donita BrooksPickard, Warren T, MD  Multiple Vitamins-Minerals (ONE-A-DAY WOMENS 50+ PO) Take 1 tablet by mouth daily.    [provider]  OLANZapine (ZYPREXA) 10 MG tablet TAKE (1) TABLET BY MOUTH AT  BEDTIME. 02/19/23   Donita BrooksPickard, Warren T, MD  omeprazole (PRILOSEC) 40 MG capsule TAKE (1) CAPSULE BY MOUTH TWICE DAILY. 02/02/23   Donita BrooksPickard, Warren T, MD  traZODone (DESYREL) 100 MG tablet Take 1 tablet (100 mg total) by mouth at bedtime. 01/02/23   Donita BrooksPickard, Warren T, MD  LINZESS 72 MCG capsule TAKE ONE CAPSULE ORALLY EVERY MORNING BEFORE BREAKFAST. Patient taking differently: Take 72 mcg by mouth daily before breakfast.  06/08/19 02/29/20  Anice PaganiniGill, Eric A, NP      Allergies    Patient has no known allergies.    Review of Systems   Review of Systems  Neurological:  Positive for weakness.    Physical Exam   Vitals:   03/02/23 1930 03/02/23 2100  BP: (!) 148/77 123/68  Pulse: 90 88  Resp: 19 14  Temp:    SpO2: 97% 98%    CONSTITUTIONAL:  well-appearing, pale, NAD NEURO:  GCS 15. Speech is goal oriented. No deficits appreciated to CN III-XII; symmetric eyebrow raise, no facial drooping, tongue midline. Patient has equal grip strength bilaterally with 5/5 strength against resistance in all major muscle groups bilaterally. Sensation to light touch intact. Patient moves extremities without ataxia. Normal finger-nose-finger. Patient ambulatory with steady gait. EYES:  eyes equal and reactive ENT/NECK:  Supple, no stridor, dry mucous membranes CARDIO:  tachycardia and regular rhythm, appears well-perfused PULM:  No respiratory  distress, CTAB GI/GU:  non-distended, soft, non tender MSK/SPINE:  No gross deformities, no edema, moves all extremities  SKIN:  no rash, atraumatic, skin turgor   *Additional and/or pertinent findings included in MDM below    ED Results / Procedures / Treatments   Labs (all labs ordered are listed, but only abnormal results are displayed) Labs Reviewed  COMPREHENSIVE METABOLIC PANEL - Abnormal; Notable for the following components:      Result Value   Potassium 2.6 (*)    Calcium 8.3 (*)    Total Protein 5.4 (*)    Albumin 2.8 (*)    AST 105 (*)    All other  components within normal limits  CBC - Abnormal; Notable for the following components:   RDW 17.0 (*)    All other components within normal limits  MAGNESIUM - Abnormal; Notable for the following components:   Magnesium 1.5 (*)    All other components within normal limits  CBG MONITORING, ED - Abnormal; Notable for the following components:   Glucose-Capillary 109 (*)    All other components within normal limits  LIPASE, BLOOD  TSH  URINALYSIS, ROUTINE W REFLEX MICROSCOPIC  HIV ANTIBODY (ROUTINE TESTING W REFLEX)  HEMOGLOBIN A1C  BASIC METABOLIC PANEL  CBC  MAGNESIUM  VITAMIN B12  FOLATE  VITAMIN B6  VITAMIN B1    EKG EKG Interpretation  Date/Time:  Monday March 02 2023 18:59:13 EDT Ventricular Rate:  94 PR Interval:  157 QRS Duration: 80 QT Interval:  368 QTC Calculation: 461 R Axis:   33 Text Interpretation: Sinus rhythm Atrial premature complexes Nonspecific T abnormalities, lateral leads Confirmed by Gloris Manchester ([id number]) on 03/02/2023 7:14:38 PM  Radiology No results found.  Procedures .Critical Care  Performed by: Gareth Eagle, PA-C Authorized by: Gareth Eagle, PA-C   Critical care provider statement:    Critical care time (minutes):  30   Critical care was necessary to treat or prevent imminent or life-threatening deterioration of the following conditions:  Metabolic crisis (hypokalemia)   Critical care was time spent personally by me on the following activities:  Development of treatment plan with patient or surrogate, discussions with consultants, evaluation of patient's response to treatment, examination of patient, ordering and review of laboratory studies, ordering and review of radiographic studies, ordering and performing treatments and interventions, pulse oximetry, re-evaluation of patient's condition and review of old charts     Medications Ordered in ED Medications  potassium chloride 10 mEq in 100 mL IVPB (10 mEq Intravenous New Bag/Given  03/02/23 2130)  atorvastatin (LIPITOR) tablet 40 mg (has no administration in time range)  escitalopram (LEXAPRO) tablet 20 mg (has no administration in time range)  OLANZapine (ZYPREXA) tablet 10 mg (has no administration in time range)  traZODone (DESYREL) tablet 100 mg (has no administration in time range)  levothyroxine (SYNTHROID) tablet 50 mcg (has no administration in time range)  pantoprazole (PROTONIX) EC tablet 40 mg (has no administration in time range)  levETIRAcetam (KEPPRA) tablet 1,000 mg (has no administration in time range)  enoxaparin (LOVENOX) injection 40 mg (has no administration in time range)  0.9 % NaCl with KCl 40 mEq / L  infusion (has no administration in time range)  acetaminophen (TYLENOL) tablet 650 mg (has no administration in time range)    Or  acetaminophen (TYLENOL) suppository 650 mg (has no administration in time range)  ondansetron (ZOFRAN) tablet 4 mg (has no administration in time range)    Or  ondansetron (ZOFRAN)  injection 4 mg (has no administration in time range)  sodium chloride 0.9 % bolus 1,000 mL (0 mLs Intravenous Stopped 03/02/23 2130)  ondansetron (ZOFRAN) injection 4 mg (4 mg Intravenous Given 03/02/23 1949)  potassium chloride SA (KLOR-CON M) CR tablet 40 mEq (40 mEq Oral Given 03/02/23 2026)  gabapentin (NEURONTIN) capsule 100 mg (100 mg Oral Given 03/02/23 2026)  magnesium oxide (MAG-OX) tablet 400 mg (400 mg Oral Given 03/02/23 2043)    ED Course/ Medical Decision Making/ A&P                             Medical Decision Making Amount and/or Complexity of Data Reviewed Labs: ordered.  Risk OTC drugs. Prescription drug management. Decision regarding hospitalization.   Initial Impression and Ddx 62 year old female who is well-appearing and hemodynamically stable presenting for nausea weakness and dizziness.  Exam findings concerning for dehydration.  DDx includes dehydration, electrolyte derangement, intra-abdominal infection, and  arrhythmia. Patient PMH that increases complexity of ED encounter:  h/o stroke, bipolar 1 disorder, hyperlipidemia, and hypothyroidism  Interpretation of Diagnostics I independent reviewed and interpreted the labs as followed: Hypokalemia, hypomagnesemia  I personally reviewed and interpreted EKG which revealed T wave abnormalities and QTc prolongation  Patient Reassessment and Ultimate Disposition/Management Treated hypokalemia with oral and IV potassium.  Treated hypomagnesemia with oral magnesium.  Treat her nausea with Zofran.  Volume resuscitated with normal saline bolus.  Admitted to hospital for hypokalemia likely secondary to malnutrition.  Dr.Pahwani accepted patient.  Patient management required discussion with the following services or consulting groups:  Hospitalist Service  Complexity of Problems Addressed Acute complicated illness or Injury  Additional Data Reviewed and Analyzed Further history obtained from: Further history from spouse/family member, Past medical history and medications listed in the EMR, and Prior ED visit notes  Patient Encounter Risk Assessment Consideration of hospitalization         Final Clinical Impression(s) / ED Diagnoses Final diagnoses:  Hypokalemia  Hypomagnesemia    Rx / DC Orders ED Discharge Orders     None         Gareth Eagle, PA-C 03/02/23 2147    Gloris Manchester, MD 03/04/23 0200

## 2023-03-03 ENCOUNTER — Inpatient Hospital Stay (HOSPITAL_COMMUNITY)
Admit: 2023-03-03 | Discharge: 2023-03-03 | Disposition: A | Discharge status: Home / Self Care | Payer: Medicaid Other | Attending: Internal Medicine | Admitting: Internal Medicine

## 2023-03-03 ENCOUNTER — Observation Stay (HOSPITAL_COMMUNITY): Payer: Medicaid Other

## 2023-03-03 ENCOUNTER — Ambulatory Visit: Payer: Medicaid Other | Admitting: Family Medicine

## 2023-03-03 ENCOUNTER — Other Ambulatory Visit: Payer: Self-pay

## 2023-03-03 DIAGNOSIS — R531 Weakness: Secondary | ICD-10-CM

## 2023-03-03 DIAGNOSIS — Z85828 Personal history of other malignant neoplasm of skin: Secondary | ICD-10-CM | POA: Diagnosis not present

## 2023-03-03 DIAGNOSIS — R569 Unspecified convulsions: Secondary | ICD-10-CM | POA: Diagnosis not present

## 2023-03-03 DIAGNOSIS — Z8249 Family history of ischemic heart disease and other diseases of the circulatory system: Secondary | ICD-10-CM | POA: Diagnosis not present

## 2023-03-03 DIAGNOSIS — Z87891 Personal history of nicotine dependence: Secondary | ICD-10-CM | POA: Diagnosis not present

## 2023-03-03 DIAGNOSIS — K529 Noninfective gastroenteritis and colitis, unspecified: Secondary | ICD-10-CM | POA: Diagnosis present

## 2023-03-03 DIAGNOSIS — E785 Hyperlipidemia, unspecified: Secondary | ICD-10-CM | POA: Diagnosis present

## 2023-03-03 DIAGNOSIS — E538 Deficiency of other specified B group vitamins: Secondary | ICD-10-CM | POA: Diagnosis present

## 2023-03-03 DIAGNOSIS — R627 Adult failure to thrive: Secondary | ICD-10-CM | POA: Diagnosis present

## 2023-03-03 DIAGNOSIS — E876 Hypokalemia: Secondary | ICD-10-CM | POA: Diagnosis present

## 2023-03-03 DIAGNOSIS — Z833 Family history of diabetes mellitus: Secondary | ICD-10-CM | POA: Diagnosis not present

## 2023-03-03 DIAGNOSIS — I10 Essential (primary) hypertension: Secondary | ICD-10-CM | POA: Diagnosis present

## 2023-03-03 DIAGNOSIS — G629 Polyneuropathy, unspecified: Secondary | ICD-10-CM | POA: Diagnosis present

## 2023-03-03 DIAGNOSIS — K219 Gastro-esophageal reflux disease without esophagitis: Secondary | ICD-10-CM | POA: Diagnosis present

## 2023-03-03 DIAGNOSIS — F4310 Post-traumatic stress disorder, unspecified: Secondary | ICD-10-CM | POA: Diagnosis present

## 2023-03-03 DIAGNOSIS — F319 Bipolar disorder, unspecified: Secondary | ICD-10-CM | POA: Diagnosis present

## 2023-03-03 DIAGNOSIS — G40909 Epilepsy, unspecified, not intractable, without status epilepticus: Secondary | ICD-10-CM | POA: Diagnosis present

## 2023-03-03 DIAGNOSIS — R55 Syncope and collapse: Secondary | ICD-10-CM | POA: Diagnosis not present

## 2023-03-03 DIAGNOSIS — M25552 Pain in left hip: Secondary | ICD-10-CM | POA: Diagnosis present

## 2023-03-03 DIAGNOSIS — G8929 Other chronic pain: Secondary | ICD-10-CM | POA: Diagnosis present

## 2023-03-03 DIAGNOSIS — E039 Hypothyroidism, unspecified: Secondary | ICD-10-CM | POA: Diagnosis present

## 2023-03-03 DIAGNOSIS — E0590 Thyrotoxicosis, unspecified without thyrotoxic crisis or storm: Secondary | ICD-10-CM | POA: Diagnosis present

## 2023-03-03 DIAGNOSIS — E860 Dehydration: Secondary | ICD-10-CM | POA: Diagnosis present

## 2023-03-03 DIAGNOSIS — F419 Anxiety disorder, unspecified: Secondary | ICD-10-CM | POA: Diagnosis present

## 2023-03-03 DIAGNOSIS — Z96643 Presence of artificial hip joint, bilateral: Secondary | ICD-10-CM | POA: Diagnosis present

## 2023-03-03 DIAGNOSIS — R634 Abnormal weight loss: Secondary | ICD-10-CM | POA: Diagnosis present

## 2023-03-03 LAB — URINALYSIS, ROUTINE W REFLEX MICROSCOPIC
Glucose, UA: NEGATIVE mg/dL
Hgb urine dipstick: NEGATIVE
Ketones, ur: 5 mg/dL — AB
Nitrite: NEGATIVE
Protein, ur: 30 mg/dL — AB
Specific Gravity, Urine: 1.025 (ref 1.005–1.030)
pH: 5 (ref 5.0–8.0)

## 2023-03-03 LAB — CBC
HCT: 40.7 % (ref 36.0–46.0)
Hemoglobin: 12.8 g/dL (ref 12.0–15.0)
MCH: 28.1 pg (ref 26.0–34.0)
MCHC: 31.4 g/dL (ref 30.0–36.0)
MCV: 89.5 fL (ref 80.0–100.0)
Platelets: 197 10*3/uL (ref 150–400)
RBC: 4.55 MIL/uL (ref 3.87–5.11)
RDW: 17.3 % — ABNORMAL HIGH (ref 11.5–15.5)
WBC: 6 10*3/uL (ref 4.0–10.5)
nRBC: 0 % (ref 0.0–0.2)

## 2023-03-03 LAB — BASIC METABOLIC PANEL
Anion gap: 8 (ref 5–15)
BUN: 12 mg/dL (ref 6–20)
CO2: 27 mmol/L (ref 22–32)
Calcium: 7.8 mg/dL — ABNORMAL LOW (ref 8.9–10.3)
Chloride: 105 mmol/L (ref 98–111)
Creatinine, Ser: 0.75 mg/dL (ref 0.44–1.00)
GFR, Estimated: >60 mL/min (ref >60)
Glucose, Bld: 86 mg/dL (ref 70–99)
Potassium: 3.7 mmol/L (ref 3.5–5.1)
Sodium: 140 mmol/L (ref 135–145)

## 2023-03-03 LAB — GLUCOSE, CAPILLARY: Glucose-Capillary: 72 mg/dL (ref 70–99)

## 2023-03-03 LAB — HIV ANTIBODY (ROUTINE TESTING W REFLEX): HIV Screen 4th Generation wRfx: NONREACTIVE

## 2023-03-03 LAB — HEMOGLOBIN A1C
Hgb A1c MFr Bld: 5.7 % — ABNORMAL HIGH (ref 4.8–5.6)
Mean Plasma Glucose: 116.89 mg/dL

## 2023-03-03 LAB — MAGNESIUM: Magnesium: 1.9 mg/dL (ref 1.7–2.4)

## 2023-03-03 LAB — PHOSPHORUS: Phosphorus: 2.7 mg/dL (ref 2.5–4.6)

## 2023-03-03 MED ORDER — MELATONIN 3 MG PO TABS
6.0000 mg | ORAL_TABLET | Freq: Once | ORAL | Status: AC
  Administered 2023-03-03: 6 mg via ORAL
  Filled 2023-03-03: qty 2

## 2023-03-03 MED ORDER — METRONIDAZOLE 500 MG/100ML IV SOLN
500.0000 mg | Freq: Two times a day (BID) | INTRAVENOUS | Status: DC
  Administered 2023-03-03 – 2023-03-06 (×6): 500 mg via INTRAVENOUS
  Filled 2023-03-03 (×6): qty 100

## 2023-03-03 MED ORDER — CYANOCOBALAMIN 1000 MCG/ML IJ SOLN
1000.0000 ug | Freq: Every day | INTRAMUSCULAR | Status: AC
  Administered 2023-03-03 – 2023-03-05 (×3): 1000 ug via INTRAMUSCULAR
  Filled 2023-03-03 (×3): qty 1

## 2023-03-03 MED ORDER — OCUVITE-LUTEIN PO CAPS
1.0000 | ORAL_CAPSULE | Freq: Every day | ORAL | Status: DC
  Administered 2023-03-03 – 2023-03-06 (×4): 1 via ORAL
  Filled 2023-03-03 (×4): qty 1

## 2023-03-03 MED ORDER — SODIUM CHLORIDE 0.9 % IV SOLN
2.0000 g | INTRAVENOUS | Status: DC
  Administered 2023-03-03 – 2023-03-05 (×3): 2 g via INTRAVENOUS
  Filled 2023-03-03 (×3): qty 20

## 2023-03-03 MED ORDER — ENSURE ENLIVE PO LIQD
237.0000 mL | Freq: Two times a day (BID) | ORAL | Status: DC
  Administered 2023-03-03 – 2023-03-06 (×5): 237 mL via ORAL

## 2023-03-03 MED ORDER — SENNOSIDES-DOCUSATE SODIUM 8.6-50 MG PO TABS
1.0000 | ORAL_TABLET | Freq: Two times a day (BID) | ORAL | Status: DC
  Administered 2023-03-03: 1 via ORAL
  Filled 2023-03-03: qty 1

## 2023-03-03 MED ORDER — PROCHLORPERAZINE EDISYLATE 10 MG/2ML IJ SOLN: 10.0000 mg | Freq: Four times a day (QID) | INTRAMUSCULAR | Status: DC | PRN

## 2023-03-03 MED ORDER — POLYETHYLENE GLYCOL 3350 17 G PO PACK
17.0000 g | PACK | Freq: Every day | ORAL | Status: DC
  Administered 2023-03-03: 17 g via ORAL
  Filled 2023-03-03: qty 1

## 2023-03-03 NOTE — Progress Notes (Signed)
Mobility Specialist Progress Note:    03/03/23 1135  Mobility  Activity Refused mobility  Mobility Referral Yes   Pt refused mobility d/t weakness and fear of passing out. Left pt in bed, all needs met, call bell in reach.   Feliciana Rossetti Mobility Specialist Please contact via Special educational needs teacher or  Rehab office at [phone]

## 2023-03-03 NOTE — Progress Notes (Signed)
RE: Sheryl Hall Date Of Birth: May 16, 1961 Date: 03/03/2023  MUST ID: [id number]  To Whom It May Concern:   Please be advised that the above name patient will require a short-term nursing home stay - anticipated 30 days or less rehabilitation and strengthening. The plan is for return home.

## 2023-03-03 NOTE — Evaluation (Signed)
Occupational Therapy Evaluation Patient Details Name: Sheryl Hall MRN: [id number] DOB: March 13, 1961 Today's Date: 03/03/2023   History of Present Illness Sheryl Hall is a 62 y.o. female with medical history significant of hypertension, hyperlipidemia, GERD, hypothyroidism and many other presented to ED with several complaints.  Basically she started having weakness about 3 months ago.  Her sister is at the bedside who is providing additional information as well.  According to her, patient having history of anxiety, depression as well as bipolar disorder, oftentimes does not want to get out and would be laying in the bed or in the chair for 24 hours.  Per patient, she basically started feeling weak 3 months ago.  But since about 3 to 4 weeks ago, she has had very poor appetite and she is eating only little and has been drinking almost 3 sodas a day with no water but sometimes will drink some juice.  Per sister, she is barely eating 300 cal a day.  Per patient, she has lost approximately 20 to 25 pounds in about 2 to 3 months.  She denies any chest pain, abdominal pain, shortness of breath, nausea, vomiting, any problem with urination or with bowel movements.  Per sister, she has been out of her home only once in 3 months and that 2 was for doctor's visit.  Patient claims that she takes all her medications regularly including Synthroid.  She does carry a history of GERD.  Additionally, patient is complaining of severe pain and tingling and burning in bilateral feet which started about 2 months ago. (per MD)   Clinical Impression   Pt agreeable to OT and PT co-evaluation. Pt reported dizziness throughout session. Seated blood pressure taken seated before transfers at 154/69. Taken again after transfer at 122/104. Pt required mod A for transfer and reported pain in B UE during attempted A/ROM. Pt reports new weakness in B UE. Pt struggled with lower body dressing and is likely at level of mod A for most  lower body ADL tasks. Pt left in chair with call bell within reach. Pt will benefit from continued OT in the hospital and recommended venue below to increase strength, balance, and endurance for safe ADL's.         Recommendations for follow up therapy are one component of a multi-disciplinary discharge planning process, led by the attending physician.  Recommendations may be updated based on patient status, additional functional criteria and insurance authorization.   Assistance Recommended at Discharge Intermittent Supervision/Assistance  Patient can return home with the following A lot of help with walking and/or transfers;A lot of help with bathing/dressing/bathroom;Assistance with cooking/housework;Assist for transportation;Help with stairs or ramp for entrance    Functional Status Assessment  Patient has had a recent decline in their functional status and demonstrates the ability to make significant improvements in function in a reasonable and predictable amount of time.  Equipment Recommendations  None recommended by OT    Recommendations for Other Services       Precautions / Restrictions Precautions Precautions: Fall Restrictions Weight Bearing Restrictions: No      Mobility Bed Mobility Overal bed mobility: Needs Assistance Bed Mobility: Supine to Sit     Supine to sit: Supervision     General bed mobility comments: Mild labored movement.    Transfers Overall transfer level: Needs assistance Equipment used: Rolling walker (2 wheels) Transfers: Sit to/from Stand, Bed to chair/wheelchair/BSC Sit to Stand: Mod assist, Min assist     Step pivot transfers:  Mod assist     General transfer comment: Unsteady in standing. Reports of dizziness.      Balance Overall balance assessment: Needs assistance Sitting-balance support: No upper extremity supported, Feet supported Sitting balance-Leahy Scale: Good Sitting balance - Comments: seated at EOB   Standing  balance support: Bilateral upper extremity supported, During functional activity Standing balance-Leahy Scale: Fair Standing balance comment: using RW                           ADL either performed or assessed with clinical judgement   ADL Overall ADL's : Needs assistance/impaired     Grooming: Minimal assistance;Sitting   Upper Body Bathing: Minimal assistance;Sitting   Lower Body Bathing: Moderate assistance;Sitting/lateral leans   Upper Body Dressing : Minimal assistance;Sitting   Lower Body Dressing: Moderate assistance;Sitting/lateral leans Lower Body Dressing Details (indicate cue type and reason): Kelle DartingAbe to don one sock but not the other seated at [employer]OB Toilet Transfer: Moderate assistance;Stand-pivot;Rolling walker (2 wheels) Toilet Transfer Details (indicate cue type and reason): Simulated via EOB to chair transfer. Toileting- Clothing Manipulation and Hygiene: Minimal assistance;Sitting/lateral lean       Functional mobility during ADLs: Moderate assistance       Vision Baseline Vision/History: 0 No visual deficits Ability to See in Adequate Light: 0 Adequate Patient Visual Report: No change from baseline Vision Assessment?: No apparent visual deficits                Pertinent Vitals/Pain Pain Assessment Pain Assessment: 0-10 Pain Score: 8  Pain Location: feet Pain Descriptors / Indicators: Pins and needles Pain Intervention(s): Limited activity within patient's tolerance, Monitored during session, Repositioned     Hand Dominance Right   Extremity/Trunk Assessment Upper Extremity Assessment Upper Extremity Assessment: RUE deficits/detail;LUE deficits/detail RUE Deficits / Details: 2+/5 shoulder flexion; limited to ~50% available range for shoulder P/ROM. LUE Deficits / Details: 2+/5 shoulder flexion; limited to ~50% available range for shoulder P/ROM.   Lower Extremity Assessment Lower Extremity Assessment: Defer to PT evaluation   Cervical  / Trunk Assessment Cervical / Trunk Assessment: Normal   Communication Communication Communication: No difficulties   Cognition Arousal/Alertness: Awake/alert Behavior During Therapy: WFL for tasks assessed/performed Overall Cognitive Status: Within Functional Limits for tasks assessed                                       General Comments  Pt reportedly dizzy throughout session.               Home Living Family/patient expects to be discharged to:: Private residence Living Arrangements: Alone Available Help at Discharge: Family;Friend(s);Available PRN/intermittently Type of Home: Apartment Home Access: Level entry     Home Layout: One level     Bathroom Shower/Tub: Tub/shower unit;Sponge bathes at baseline   Bathroom Toilet: Handicapped height Bathroom Accessibility: Yes How Accessible: Accessible via walker Home Equipment: Rolling Walker (2 wheels);Grab bars - tub/shower          Prior Functioning/Environment Prior Level of Function : Needs assist       Physical Assist : ADLs (physical)   ADLs (physical): IADLs Mobility Comments: [place] ambulator with RW ADLs Comments: Indpendent ADL; assisted for IADL's.        OT Problem List: Decreased strength;Decreased range of motion;Decreased activity tolerance;Impaired balance (sitting and/or standing)      OT Treatment/Interventions: Self-care/ADL training;Therapeutic exercise;Therapeutic activities;Balance  training;Patient/family education;DME and/or AE instruction    OT Goals(Current goals can be found in the care plan section) Acute Rehab OT Goals Patient Stated Goal: Open to rehab to improve function. OT Goal Formulation: With patient Time For Goal Achievement: 03/17/23 Potential to Achieve Goals: Good  OT Frequency: Min 2X/week    Co-evaluation PT/OT/SLP Co-Evaluation/Treatment: Yes Reason for Co-Treatment: To address functional/ADL transfers   OT goals addressed during session:  ADL's and self-care      AM-PAC OT "6 Clicks" Daily Activity     Outcome Measure Help from another person eating meals?: None Help from another person taking care of personal grooming?: A Little Help from another person toileting, which includes using toliet, bedpan, or urinal?: A Little Help from another person bathing (including washing, rinsing, drying)?: A Lot Help from another person to put on and taking off regular upper body clothing?: A Little Help from another person to put on and taking off regular lower body clothing?: A Lot 6 Click Score: 17   End of Session Equipment Utilized During Treatment: Rolling walker (2 wheels)  Activity Tolerance: Patient tolerated treatment well Patient left: in chair;with call bell/phone within reach  OT Visit Diagnosis: Unsteadiness on feet (R26.81);Other abnormalities of gait and mobility (R26.89);Muscle weakness (generalized) (M62.81);Dizziness and giddiness (R42)                Time: 1683-7290 OT Time Calculation (min): 13 min Charges:  OT General Charges $OT Visit: 1 Visit OT Evaluation $OT Eval Low Complexity: 1 Low  Bertie Mcconathy OT, MOT  Danie Chandler 03/03/2023, 10:52 AM

## 2023-03-03 NOTE — Progress Notes (Signed)
Patient ambulated from chair to bathroom with two assist. Patient was in bathroom felt dizzy and reported to this writer she was going to pass out. Earnstine Regal RN in room. Patient become more pale and had syncopal episodes, rapid response called. Patient began sliding back on bathroom toilet, patient supported by staff. Vitals signs: T-98.2, P-104, BP-134/102, O2-98% at Room air. Blood glucose 72. Patient assisted by staff from bathroom to wheelchair then back to bed. MD Renford Dills and AC at bedside. New orders placed by MD. Obtained tele order and placed on tele. Patient in sinus rhythm.

## 2023-03-03 NOTE — NC FL2 (Signed)
MEDICAID [id number] LEVEL OF CARE FORM     IDENTIFICATION  Patient Name: Sheryl Hall Birthdate: [birth year] Sex: female Admission Date (Current Location): 03/02/2023  [facility] and [place] Number:  [employer] and Address:  [facility],  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   Avascular necrosis of femur head, right 05/23/2015   Constipation 09/20/2014   Loose stools 08/02/2014   Postoperative anemia due to acute blood loss 04/20/2014   Avascular necrosis of femur head, left 04/19/2014   OA (osteoarthritis) of hip 04/19/2014   Partial epilepsy with impairment of consciousness 04/13/2014   Pain in limb 12/19/2013   History of stroke 12/19/2013   Acute confusional state 03/24/2013   Low back pain    PTSD (post-traumatic stress disorder) 03/04/2013   Bipolar disorder (HCC) 03/04/2013   Anxiety    Depression    Stroke    Hyperthyroidism    Hyperlipidemia    Folate deficiency    Hx of abnormal Pap smear    Abdominal bloating 11/05/2011   IBS (irritable bowel syndrome) 11/05/2011   Chronic diarrhea 09/18/2011   Gastroparesis 09/18/2011   Drug abuse 09/18/2011   Tubular adenoma of colon 09/18/2011   Family hx of colon cancer 09/18/2011   COLONIC POLYPS, ADENOMATOUS 07/08/2010   DIZZINESS 07/08/2010   GERD 05/16/2010   WEIGHT LOSS 05/16/2010   NAUSEA WITH VOMITING  05/16/2010   Dysphagia 05/16/2010   DIARRHEA 05/16/2010   ABDOMINAL PAIN, UNSPECIFIED SITE 05/16/2010    Orientation RESPIRATION BLADDER Height & Weight     Self, Time, Situation, Place  Normal Continent Weight: 170 lb (77.1 kg) Height:  5\' 6"  (167.6 cm)  BEHAVIORAL SYMPTOMS/MOOD NEUROLOGICAL BOWEL NUTRITION STATUS      Continent Diet  AMBULATORY STATUS COMMUNICATION OF NEEDS Skin   Extensive Assist   Normal                       Personal Care Assistance Level of Assistance  Bathing, Feeding, Dressing Bathing Assistance: Limited assistance Feeding assistance: Independent Dressing Assistance: Limited assistance     Functional Limitations Info  Sight, Hearing, Speech Sight Info: Adequate Hearing Info: Adequate Speech Info: Adequate    SPECIAL CARE FACTORS FREQUENCY  PT (By licensed PT), OT (By licensed OT)     PT Frequency: 5 times weekly OT Frequency: 5 times weekly            Contractures Contractures Info: Not present    Additional Factors Info  Code Status, Allergies Code Status Info: FULL Allergies Info: NKA           Current Medications (03/03/2023):  This is the current hospital active medication list Current Facility-Administered Medications  Medication Dose Route Frequency Provider Last Rate Last Admin   0.9 % NaCl with KCl 40 mEq / L  infusion   Intravenous Continuous  Hughie Closs, MD 125 mL/hr at 03/03/23 0824 New Bag at 03/03/23 3143   acetaminophen (TYLENOL) tablet 650 mg  650 mg Oral Q6H PRN Hughie Closs, MD   650 mg at 03/02/23 2210   Or   acetaminophen (TYLENOL) suppository 650 mg  650 mg Rectal Q6H PRN Hughie Closs, MD       atorvastatin (LIPITOR) tablet 40 mg  40 mg Oral Daily Pahwani, Ravi, MD       cyanocobalamin (VITAMIN B12) injection 1,000 mcg  1,000 mcg Intramuscular Daily Adhikari, Amrit, MD       enoxaparin (LOVENOX) injection 40 mg  40 mg Subcutaneous Q24H Pahwani, Ravi, MD   40 mg at 03/02/23 2211   escitalopram (LEXAPRO)  tablet 20 mg  20 mg Oral Daily Pahwani, Daleen Bo, MD       levETIRAcetam (KEPPRA) tablet 1,000 mg  1,000 mg Oral BID Hughie Closs, MD   1,000 mg at 03/02/23 2211   levothyroxine (SYNTHROID) tablet 50 mcg  50 mcg Oral Daily Hughie Closs, MD   50 mcg at 03/03/23 0517   ondansetron (ZOFRAN) tablet 4 mg  4 mg Oral Q6H PRN Hughie Closs, MD   4 mg at 03/03/23 0518   Or   ondansetron (ZOFRAN) injection 4 mg  4 mg Intravenous Q6H PRN Pahwani, Daleen Bo, MD       pantoprazole (PROTONIX) EC tablet 40 mg  40 mg Oral Daily Hughie Closs, MD         Discharge Medications: Please see discharge summary for a list of discharge medications.  Relevant Imaging Results:  Relevant Lab Results:   Additional Information SSN: [id number] [place]

## 2023-03-03 NOTE — ED Notes (Signed)
Pt spo2 decreased to 88% while sleeping. 2L [place] placed.

## 2023-03-03 NOTE — Discharge Instructions (Signed)
General, Healthful Nutrition Therapy  This handout provides you with the information you'll need to follow a general, healthful diet, which can be tailored to your personal preferences. There are several benefits to following a general, healthful diet: It could mean less calories, less salt, less added sugars, and less saturated fat than many other diets. This outcome will depend on the foods you choose. Eating more whole grains, beans, lentils, fruits, vegetables, nuts, and seeds may improve how much fiber, vitamins, and minerals you eat.  It can lower your risk for health conditions like diabetes, heart disease, hypertension, stroke, and cancer. Your registered dietitian nutritionist (RDN) may recommend portion sizes based on your individual needs and personal and cultural preferences.  Tips Every day, eat a variety of fruits and vegetables in a variety of colors. Be sure to include lots of dark green, red, blue-purple, and orange vegetables. Choose whole grains for at least half of your grain selections. Eat more beans, peas, and lentils. Try meatless alternatives. Get protein in your diet from eggs, fish, poultry, beans, peas, lentils, and nuts/nut butters. Low-fat or fat-free dairy products are also good sources of protein. Keep your salt intake to a minimum (less than 2300 milligrams per day). Limit use of salt, soy sauce, or fish sauce when cooking. Eat freshly prepared meals at home. Processed, prepackaged, and restaurant foods contain more salt. Choose fresh fruits and vegetables for snacks. Choose products with lower sodium content when grocery shopping. Limit your daily sugar intake. Sugar may be used in sauces, marinades, dressings, and condiments - even those that do not taste sweet.  Sugar can be found in honey, syrups, jelly, fruit juice, and fruit juice concentrate. Limit sugar-sweetened beverages like sodas and fruit juice, sugary snacks, and candy. It's best to choose  products without added sugar, but if you do eat them, read labels carefully so you know how much sugar is in each portion. It is better to eat unsaturated fats than saturated fats. Use fats and oils in moderation, up to 5 servings per day. Unsaturated fat is found in fish, avocado, nuts, and oils like sunflower, canola, avocado and olive oils. Saturated fat is found in fatty meat, butter, ice cream, palm and coconut oil, cream, cheese, and lard. Many processed foods, fried foods, fast food items, convenience foods like frozen pizza and snack foods, and sweets including pies, cookies, and other pastries are high in fat. Check nutrition labels and choose these foods less often. Use vegetable oil instead of lard or butter for cooking. Boil, steam, or bake your food instead of deep frying in oil. Remove the fatty part of meats before cooking.  Foods to Choose or to Limit Choose a healthful balance of foods from each food group at your meals. Your RDN may make individualized portion size recommendations based on your needs. Food Group Foods to Choose Foods to Limit  Grains Whole wheat, barley, rye, buckwheat, corn, teff, quinoa, millet, amaranth, brown and wild rice, sorghum, and oats; focus on intact cooked whole grains Grain products, such as bread, rolls, prepared breakfast cereals, crackers, and pasta made from whole grains that are low in added sugars, saturated fat, and sodium Sweetened, low-fiber breakfast cereals Packaged (high sugar, refined ingredients) baked goods Snack crackers and chips made of refined ingredients, cheese crackers, butter crackers Breads made with refined ingredients and saturated fats, such as biscuits, frozen waffles, sweet breads, doughnuts, pastries, packaged baking mixes, pancakes, cakes, and cookies  Protein Foods Meat, including lean, trimmed cuts of  beef, pork, or lamb a few times per week or less Poultry, including skinless chicken or [place] Seafood, including  fish, shrimp, lobster, clams, and scallops at least twice per week. Focus on fatty fish, such as salmon, herring, and sardines, as a rich source of omega-3 fatty acids Eggs Nuts and seeds, such as peanuts, almonds, pistachios, and sunflower seeds (unsalted varieties) Nut and seed butters, such as peanut butter, almond butter, and sunflower seed butter (reduced-sodium varieties)  Soy foods such as tofu, tempeh, or soy nuts Plant protein-based meat alternatives, such as veggie burgers and sausages (reduced-sodium varieties) Unsalted beans, lentils, or peas at least a few times per week in place of other protein sources Marbled or fatty red meats (beef, pork, lamb), such as ribs Processed red meats, such as bacon, sausage, and ham Poultry (chicken and [place]) with skin Fried meats, poultry, or fish [employer], shark, and [place] (may contain high levels of mercury) Deli meats, such as pastrami, bologna, or salami Fried eggs Salted beans, peas, lentils, nuts, seeds, or nut/seed butters Meat alternatives with high levels of sodium or saturated fat  Dairy and Dairy Alternatives Low-fat or fat-free milk, yogurt (low in added sugars), cottage cheese, and cheeses Fortified soymilk or soy yogurt Whole milk, cream, cheeses made from whole milk, sour cream Yogurt or ice cream made from whole milk or with added sugar Cream cheese made from whole milk  Vegetables A variety of vegetables, including dark-green, red, blue-purple, and orange vegetables Low-sodium vegetable juices Canned or frozen vegetables with salt, fresh vegetables prepared with salt Fried vegetables Vegetables in cream sauce or cheese sauce Tomato or pasta sauce with high levels of salt or sugar  Fruit A variety of whole fruits, canned fruit packed in water, or dried fruit 100% fruit juice (limited to  cup per day) Fruits packed in syrup or made with added sugar  Fats and Oils Unsaturated vegetable oils, including olive, peanut, and  canola oils Vegetable oil-based margarines and spreads Salad dressing and mayonnaise made from unsaturated vegetable oils Solid shortening or partially hydrogenated oils Solid margarine made with hydrogenated or partially hydrogenated oils Butter  Beverages Coffee and tea (unsweetened) Water Sweetened drinks, including sweetened coffee or tea drinks, soda, energy drinks, and sports drinks  Other Prepared foods, including soups, casseroles, salads, baked goods, and snacks made from recommended ingredients, with low levels of added saturated fat, added sugars, or salt Sugary and/or fatty desserts, candy, and other sweets; salt and seasonings that contain salt Fried foods   General, Healthful Diet Sample 1-Day Menu View Nutrient Info Breakfast 1 cup oatmeal  cup blueberries 1 ounce almonds 1 cup 1% milk or fortified soymilk 1 cup unsweetened coffee  Lunch 2 slices whole wheat bread 3 ounces [place] slices 2 lettuce leaves 2 slices tomato 1 ounce reduced-fat, reduced sodium cheese  cup carrot sticks  cup hummus 1 banana 1 cup 1% milk or fortified soymilk 1 cup unsweetened tea  Evening Meal 4 ounces salmon, baked  cup cooked brown rice 1 cup green beans, cooked 1 cup mixed greens salad 1 teaspoon olive oil mixed with vinegar of choice 1 whole wheat dinner roll 1 teaspoon margarine, soft, tub (for roll) 1 cup water  Evening Snack 1 cup low-fat yogurt  cup sliced peaches

## 2023-03-03 NOTE — Plan of Care (Signed)
  Problem: Acute Rehab OT Goals (only OT should resolve) Goal: Pt. Will Perform Grooming Flowsheets (Taken 03/03/2023 1055) Pt Will Perform Grooming:  with modified independence  sitting Goal: Pt. Will Perform Upper Body Bathing Flowsheets (Taken 03/03/2023 1055) Pt Will Perform Upper Body Bathing:  with modified independence  sitting Goal: Pt. Will Perform Upper Body Dressing Flowsheets (Taken 03/03/2023 1055) Pt Will Perform Upper Body Dressing:  with modified independence  sitting Goal: Pt. Will Perform Lower Body Dressing Flowsheets (Taken 03/03/2023 1055) Pt Will Perform Lower Body Dressing:  with modified independence  sitting/lateral leans Goal: Pt. Will Transfer To Toilet Flowsheets (Taken 03/03/2023 1055) Pt Will Transfer to Toilet:  with modified independence  stand pivot transfer Goal: Pt. Will Perform Toileting-Clothing Manipulation Flowsheets (Taken 03/03/2023 1055) Pt Will Perform Toileting - Clothing Manipulation and hygiene:  with modified independence  sitting/lateral leans Goal: Pt/Caregiver Will Perform Home Exercise Program Flowsheets (Taken 03/03/2023 1055) Pt/caregiver will Perform Home Exercise Program:  Increased ROM  Increased strength  Both right and left upper extremity  Independently  Ojani Berenson OT, MOT

## 2023-03-03 NOTE — Plan of Care (Signed)
  Problem: Acute Rehab PT Goals(only PT should resolve) Goal: Pt Will Go Supine/Side To Sit Outcome: Progressing Flowsheets (Taken 03/03/2023 1137) Pt will go Supine/Side to Sit:  with modified independence  with supervision Goal: Patient Will Transfer Sit To/From Stand Outcome: Progressing Flowsheets (Taken 03/03/2023 1137) Patient will transfer sit to/from stand: with supervision Goal: Pt Will Transfer Bed To Chair/Chair To Bed Outcome: Progressing Flowsheets (Taken 03/03/2023 1137) Pt will Transfer Bed to Chair/Chair to Bed: with supervision Goal: Pt Will Ambulate Outcome: Progressing Flowsheets (Taken 03/03/2023 1137) Pt will Ambulate:  50 feet  with min guard assist  with minimal assist  with rolling walker   11:38 AM, 03/03/23 Ocie Bob, MPT Physical Therapist with [facility] [phone] office [phone] mobile phone

## 2023-03-03 NOTE — Evaluation (Signed)
Physical Therapy Evaluation Patient Details Name: Sheryl Hall MRN: [id number] DOB: Nov 04, 1961 Today's Date: 03/03/2023  History of Present Illness  Sheryl Hall is a 62 y.o. female with medical history significant of hypertension, hyperlipidemia, GERD, hypothyroidism and many other presented to ED with several complaints.  Basically she started having weakness about 3 months ago.  Her sister is at the bedside who is providing additional information as well.  According to her, patient having history of anxiety, depression as well as bipolar disorder, oftentimes does not want to get out and would be laying in the bed or in the chair for 24 hours.  Per patient, she basically started feeling weak 3 months ago.  But since about 3 to 4 weeks ago, she has had very poor appetite and she is eating only little and has been drinking almost 3 sodas a day with no water but sometimes will drink some juice.  Per sister, she is barely eating 300 cal a day.  Per patient, she has lost approximately 20 to 25 pounds in about 2 to 3 months.  She denies any chest pain, abdominal pain, shortness of breath, nausea, vomiting, any problem with urination or with bowel movements.  Per sister, she has been out of her home only once in 3 months and that 2 was for doctor's visit.  Patient claims that she takes all her medications regularly including Synthroid.  She does carry a history of GERD.  Additionally, patient is complaining of severe pain and tingling and burning in bilateral feet which started about 2 months ago.   Clinical Impression  Patient c/o dizziness upon sitting up at bedside, BP at 154/69, apprehensive to stand and limited to a few side steps at bedside before having to sit due to c/o generalized weakness and increasing dizziness.  Patient tolerated sitting up in chair after therapy  - nurse notified.  Patient will benefit from continued skilled physical therapy in hospital and recommended venue below to increase  strength, balance, endurance for safe ADLs and gait.         Recommendations for follow up therapy are one component of a multi-disciplinary discharge planning process, led by the attending physician.  Recommendations may be updated based on patient status, additional functional criteria and insurance authorization.  Follow Up Recommendations Can patient physically be transported by private vehicle: Yes     Assistance Recommended at Discharge Set up Supervision/Assistance  Patient can return home with the following  A lot of help with walking and/or transfers;A little help with bathing/dressing/bathroom;Help with stairs or ramp for entrance;Assistance with cooking/housework    Equipment Recommendations None recommended by PT  Recommendations for Other Services       Functional Status Assessment Patient has had a recent decline in their functional status and demonstrates the ability to make significant improvements in function in a reasonable and predictable amount of time.     Precautions / Restrictions Precautions Precautions: Fall Restrictions Weight Bearing Restrictions: No      Mobility  Bed Mobility Overal bed mobility: Needs Assistance Bed Mobility: Supine to Sit     Supine to sit: Supervision, Min guard     General bed mobility comments: increased time, labored movement    Transfers Overall transfer level: Needs assistance Equipment used: Rolling walker (2 wheels) Transfers: Sit to/from Stand, Bed to chair/wheelchair/BSC Sit to Stand: Mod assist, Min assist   Step pivot transfers: Mod assist       General transfer comment: unsteady labord movement with  c/o dizziness    Ambulation/Gait Ambulation/Gait assistance: Min assist, Mod assist Gait Distance (Feet): 4 Feet Assistive device: Rolling walker (2 wheels) Gait Pattern/deviations: Decreased step length - right, Decreased step length - left, Decreased stride length Gait velocity: slow     General  Gait Details: limited to a few slow labored side steps at bedside due to c/o generalized weakness and dizziness  Stairs            Wheelchair Mobility    Modified Rankin (Stroke Patients Only)       Balance Overall balance assessment: Needs assistance Sitting-balance support: Feet supported, No upper extremity supported Sitting balance-Leahy Scale: Good Sitting balance - Comments: seated at EOB   Standing balance support: During functional activity, Bilateral upper extremity supported Standing balance-Leahy Scale: Fair Standing balance comment: using RW                             Pertinent Vitals/Pain Pain Assessment Pain Assessment: 0-10 Pain Location: feet Pain Descriptors / Indicators: Pins and needles Pain Intervention(s): Limited activity within patient's tolerance, Monitored during session, Repositioned    Home Living Family/patient expects to be discharged to:: Private residence Living Arrangements: Alone Available Help at Discharge: Family;Friend(s);Available PRN/intermittently Type of Home: Apartment Home Access: Level entry       Home Layout: One level Home Equipment: Agricultural consultant (2 wheels);Grab bars - tub/shower      Prior Function Prior Level of Function : Needs assist       Physical Assist : Mobility (physical);ADLs (physical) Mobility (physical): Bed mobility;Transfers;Gait;Stairs ADLs (physical): IADLs Mobility Comments: [place] ambulator with RW ADLs Comments: Indpendent ADL; assisted for IADL's.     Hand Dominance   Dominant Hand: Right    Extremity/Trunk Assessment   Upper Extremity Assessment Upper Extremity Assessment: Defer to OT evaluation RUE Deficits / Details: 2+/5 shoulder flexion; limited to ~50% available range for shoulder P/ROM. LUE Deficits / Details: 2+/5 shoulder flexion; limited to ~50% available range for shoulder P/ROM.    Lower Extremity Assessment Lower Extremity Assessment: Generalized  weakness    Cervical / Trunk Assessment Cervical / Trunk Assessment: Normal  Communication   Communication: No difficulties  Cognition Arousal/Alertness: Awake/alert Behavior During Therapy: WFL for tasks assessed/performed Overall Cognitive Status: Within Functional Limits for tasks assessed                                          General Comments General comments (skin integrity, edema, etc.): Pt reportedly dizzy throughout session.    Exercises     Assessment/Plan    PT Assessment Patient needs continued PT services  PT Problem List Decreased strength;Decreased activity tolerance;Decreased balance;Decreased mobility       PT Treatment Interventions DME instruction;Gait training;Stair training;Functional mobility training;Therapeutic activities;Therapeutic exercise;Patient/family education;Balance training    PT Goals (Current goals can be found in the Care Plan section)  Acute Rehab PT Goals Patient Stated Goal: return home with family to assist PT Goal Formulation: With patient Time For Goal Achievement: 03/17/23 Potential to Achieve Goals: Good    Frequency Min 3X/week     Co-evaluation PT/OT/SLP Co-Evaluation/Treatment: Yes Reason for Co-Treatment: To address functional/ADL transfers PT goals addressed during session: Mobility/safety with mobility;Balance;Proper use of DME OT goals addressed during session: ADL's and self-care       AM-PAC PT "6 Clicks" Mobility  Outcome  Measure Help needed turning from your back to your side while in a flat bed without using bedrails?: None Help needed moving from lying on your back to sitting on the side of a flat bed without using bedrails?: A Little Help needed moving to and from a bed to a chair (including a wheelchair)?: A Little Help needed standing up from a chair using your arms (e.g., wheelchair or bedside chair)?: A Little Help needed to walk in hospital room?: A Lot Help needed climbing 3-5  steps with a railing? : A Lot 6 Click Score: 17    End of Session   Activity Tolerance: Patient tolerated treatment well;Patient limited by fatigue Patient left: in chair;with call bell/phone within reach Nurse Communication: Mobility status PT Visit Diagnosis: Unsteadiness on feet (R26.81);Other abnormalities of gait and mobility (R26.89);Muscle weakness (generalized) (M62.81)    Time: [id number] PT Time Calculation (min) (ACUTE ONLY): 20 min   Charges:   PT Evaluation $PT Eval Moderate Complexity: 1 Mod PT Treatments $Therapeutic Activity: 8-22 mins        11:36 AM, 03/03/23 Ocie BobJames Venezia Sargeant, MPT Physical Therapist with [facility] [phone] office [phone] mobile phone

## 2023-03-03 NOTE — TOC Initial Note (Signed)
Transition of Care [facility]) - Initial/Assessment Note    Patient Details  Name: Sheryl Hall MRN: [id number] Date of Birth: 1961/05/05  Transition of Care [facility]) CM/SW Contact:    Villa Herb, LCSWA Phone Number: 03/03/2023, 10:40 AM  Clinical Narrative:                 CSW updated that PT is recommending SNF for pt at D/C. CSW spoke with pt in room about recommendation. Pt states that she is interested in SNF and would like to stay in [place] if possible. CSW explained that referral will be completed and sent out for review. TOC to follow.   Expected Discharge Plan: Skilled Nursing Facility Barriers to Discharge: Continued Medical Work up   Patient Goals and CMS Choice Patient states their goals for this hospitalization and ongoing recovery are:: return home CMS Medicare.gov Compare Post Acute Care list provided to:: Patient Choice offered to / list presented to : Patient [place] ownership interest in [facility].provided to:: Patient    Expected Discharge Plan and Services In-house Referral: Clinical Social Work Discharge Planning Services: CM Consult Post Acute Care Choice: Skilled Nursing Facility Living arrangements for the past 2 months: Single Family Home                                      Prior Living Arrangements/Services Living arrangements for the past 2 months: Single Family Home Lives with:: Self Patient language and need for interpreter reviewed:: Yes Do you feel safe going back to the place where you live?: Yes      Need for Family Participation in Patient Care: Yes (Comment) Care giver support system in place?: Yes (comment)   Criminal Activity/Legal Involvement Pertinent to Current Situation/Hospitalization: No - Comment as needed  Activities of Daily Living      Permission Sought/Granted                  Emotional Assessment Appearance:: Appears stated age Attitude/Demeanor/Rapport: Engaged Affect (typically  observed): Accepting Orientation: : Oriented to Self, Oriented to  Time, Oriented to Place, Oriented to Situation Alcohol / Substance Use: Not Applicable Psych Involvement: No (comment)  Admission diagnosis:  Hypokalemia [E87.6] Hypomagnesemia [E83.42] Generalized weakness [R53.1] Patient Active Problem List   Diagnosis Date Noted   Generalized weakness 03/02/2023   Hypokalemia 03/02/2023   Hypomagnesemia 03/02/2023   Closed fracture of proximal end of right humerus with routine healing 01/01/2022   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   Avascular necrosis of femur head, right 05/23/2015   Constipation 09/20/2014   Loose stools 08/02/2014   Postoperative anemia due to acute blood loss 04/20/2014   Avascular necrosis of femur head, left 04/19/2014   OA (osteoarthritis) of hip 04/19/2014   Partial epilepsy with impairment of consciousness 04/13/2014   Pain in limb 12/19/2013   History of stroke 12/19/2013   Acute confusional state 03/24/2013   Low back pain    PTSD (post-traumatic stress disorder) 03/04/2013   Bipolar disorder (HCC) 03/04/2013   Anxiety    Depression    Stroke    Hyperthyroidism    Hyperlipidemia    Folate deficiency    Hx of abnormal Pap smear    Abdominal bloating 11/05/2011   IBS (irritable bowel syndrome) 11/05/2011   Chronic diarrhea 09/18/2011   Gastroparesis 09/18/2011   Drug abuse 09/18/2011   Tubular adenoma of colon 09/18/2011   Family  hx of colon cancer 09/18/2011   COLONIC POLYPS, ADENOMATOUS 07/08/2010   DIZZINESS 07/08/2010   GERD 05/16/2010   WEIGHT LOSS 05/16/2010   NAUSEA WITH VOMITING 05/16/2010   Dysphagia 05/16/2010   DIARRHEA 05/16/2010   ABDOMINAL PAIN, UNSPECIFIED SITE 05/16/2010   PCP:  Donita Orcutt, MD Pharmacy:   [facility] [phone] - [place], [place] - [address] [place] [address] Phone: [phone] Fax: [phone]  [facility] - [place], [place] - [address] [place]  [address] Phone: [phone] Fax: [phone]     Social Determinants of Health (SDOH) Social History: SDOH Screenings   Alcohol Screen: Low Risk  (07/18/2021)  Depression (PHQ2-9): High Risk (05/05/2022)  Tobacco Use: Medium Risk (03/02/2023)   SDOH Interventions:     Readmission Risk Interventions     No data to display

## 2023-03-03 NOTE — Progress Notes (Signed)
EEG complete - results pending 

## 2023-03-03 NOTE — Progress Notes (Signed)
   03/03/23 1035  Vitals  Temp 98.2 F (36.8 C)  Temp Source Oral  BP (!) 134/102  BP Location Right Arm  BP Method Automatic  Patient Position (if appropriate) Lying  Pulse Rate (!) 104  Pulse Rate Source Dinamap  Resp 18  Level of Consciousness  Level of Consciousness Alert  MEWS COLOR  MEWS Score Color Green  Oxygen Therapy  SpO2 98 %  O2 Device Room Air  Patient Activity (if Appropriate) In bed  Pulse Oximetry Type Intermittent  Pain Assessment  Pain Scale 0-10  Pain Score 0  MEWS Score  MEWS Temp 0  MEWS Systolic 0  MEWS Pulse 1  MEWS RR 0  MEWS LOC 0  MEWS Score 1  Provider Notification  Provider Name/Title Dr Damian Leavell  Date Provider Notified 03/03/23  Time Provider Notified 1040  Method of Notification Rounds  Notification Reason Other (Comment) (rapid response called)  Provider response At bedside  Date of Provider Response 03/03/23  Time of Provider Response 1040  Rapid Response Notification  Name of Rapid Response RN Notified mary mills, Rn  Date Rapid Response Notified 03/03/23  Time Rapid Response Notified 1035   Patient assisted to bathroom by assigned nurse prior to going down for CT via wheelchair. Patient reported feeling dizzy and required nurse assist to hold her up on the toilet. Rapid response called when I arrived to room. Patient pale, unable to respond to commands or questions, appeared to have syncopal episode on toilet, became alert and would pass out again. Rapid response team arrived. CBG 72, assisted to wheelchair to return to bed. Vitals obtained. Color returned once patient lying down. She was alert and oriented x 4. Reports feeling dizzy and having bowel movement . Provider assessed at bedside. Requested morning meds be administered as ordered, IV fluids going. Patient denies pain. Vitals stable, see flowsheet. High fall risk bundle components in place.

## 2023-03-03 NOTE — Progress Notes (Signed)
Initial Nutrition Assessment  DOCUMENTATION CODES:      INTERVENTION:  Ensure Enlive po BID, each supplement provides 350 kcal and 20 grams of protein.   Multivitamin daily  Nutrition education- General Healthy diet  NUTRITION DIAGNOSIS:   Inadequate oral intake related to decreased appetite, acute illness as evidenced by per patient/family report, percent weight loss.   GOAL:  Patient will meet greater than or equal to 90% of their needs   MONITOR:  PO intake, Supplement acceptance, Labs, Weight trends  REASON FOR ASSESSMENT:   Consult Assessment of nutrition requirement/status  ASSESSMENT:  Patient is a 62 yo female with hx of PTSD, GERD, HTN, chronic nausea, anxiety who presents with inadequate oral intake, generalized weakness, failure to thrive, B-12 deficiency.   Patient had syncopal episode this morning.  Patient reports fair appetite. Her sister has been helping with grocery shopping and providing meals the past few weeks. She lives alone. Able to feed herself. Home diet is regular. Encourage meal and supplement intake to support wt stability and preserve lean muscle. Encourage pt to get up in chair for meals. Provide tray set up.   Patient says she has not been out of the house for 6 weeks. Ambulates with a walker. Decline in functional status per OT evaluation. Possible SNF at discharge?  Patient has acute weight loss 5.6% (4.5 kg) x 2 months which is not significant for time period.   Medications reviewed. IM B-12 (1000 mcg) daily during hospitalization and transition to oral at d/c per MD  Vitamin B-12 ->138    Latest Ref Rng & Units 03/03/2023    4:32 AM 03/02/2023    7:23 PM 01/02/2023    2:32 PM  BMP  Glucose 70 - 99 mg/dL 86  96  93   BUN 6 - 20 mg/dL 12  12  10    Creatinine 0.44 - 1.00 mg/dL 8.88  2.80  0.34   BUN/Creat Ratio 6 - 22 (calc)   SEE NOTE:   Sodium 135 - 145 mmol/L 140  142  144   Potassium 3.5 - 5.1 mmol/L 3.7  2.6  3.8   Chloride 98 -  111 mmol/L 105  100  101   CO2 22 - 32 mmol/L 27  29  28    Calcium 8.9 - 10.3 mg/dL 7.8  8.3  9.1      Diet Order:   Diet Order             Diet regular Room service appropriate? Yes; Fluid consistency: Thin  Diet effective now                   EDUCATION NEEDS:  Education needs have been addressed  Skin:  Skin Assessment: Reviewed RN Assessment  Last BM:  4/9  Height:   Ht Readings from Last 1 Encounters:  03/03/23 5\' 6"  (1.676 m)    Weight:   Wt Readings from Last 1 Encounters:  03/03/23 77.1 kg    Ideal Body Weight:   59 kg  BMI:  Body mass index is 27.44 kg/m.  Estimated Nutritional Needs:   Kcal:  1700-1900  Protein:  84-98 gr  Fluid:  >1500 ml daily   Royann Shivers MS,RD,CSG,LDN Contact: Loretha Stapler

## 2023-03-03 NOTE — BH Assessment (Signed)
TTS spoke with Wilburt Finlay, RN regarding TTS consult. TTS was informed that pt is currently awaiting results from EEG. TTS reached out to Burnadette Pop, MD to ensure a TTS consult was needed. According to pt's chart,  pt's recommendation consist of a short term nursing stay. TTS is awaiting a response for MD at this time.    Dava Najjar, MA, LCMHCA, NCC

## 2023-03-03 NOTE — BH Assessment (Signed)
Burnadette Pop, MD requested via secure messaging that TTS consult to be conducted during day shift.   Dava Najjar, MA, LCMHCA, NCC

## 2023-03-03 NOTE — Progress Notes (Addendum)
PROGRESS NOTE  Sheryl Hall  GBT:[id number] DOB: July 21, 1961 DOA: 03/02/2023 PCP: Donita Grenda, MD   Brief Narrative: Patient is a 62 year old female with history of hypertension, hyperlipidemia, GERD, hypothyroidism who presented with generalized weakness, dizziness, poor appetite.  Started feeling weak 3 months ago.  Has very low oral intake since last 3 to 4 weeks.  History of weight loss of 20 to 25 pounds in last 2 to 3 months.  Also complains of severe pain and tingling and burning on bilateral lower extremity.  On presentation, she was hemodynamically stable.  Lab work showed potassium of 2.6, low magnesium.  Patient admitted for further workup.  PT OT consulted, recommended SNF.  Vitamin B12 found to be low.  Assessment & Plan:  Principal Problem:   Generalized weakness Active Problems:   Anxiety   Depression   Bipolar disorder (HCC)   Hyperthyroidism   Hyperlipidemia   Hypokalemia   Hypomagnesemia   Generalized weakness/failure to thrive: Not eating, drinking since last several weeks.  Complain of generalized weakness, weight loss.  Found to be severely deficient in vitamin B12.   Also ordered CT abdomen/pelvis .TSH normal. PT/OT, dietitian consulted. PT recommending SNF on discharge.  TOC following. Her depression cud be also contributing to this. She is not suicidal  Vitamin B12 deficiency: Vitamin B12 138.  Continue daily IM supplementation until discharge with oral supplement.  Hypomagnesemia/hypokalemia: Supplemented and corrected  Dehydration: Secondary to poor oral intake.  Started on IV fluids.  Renal function intact.  Syncopal episode: Happened in the morning of 4/9.  Blood pressure stable.  Continue IV fluids  Tingling/burning of bilateral lower extremity: This is secondary to vitamin B12 deficiency causing neuropathy.  Continue supplementation.  Hypertension: Currently well-controlled.  Resume home medications  Hypothyroidism: Continue Synthyroid.  TSH  normal.  History of bipolar disorder/anxiety/depression: On Lexapro, olanzapine, trazodone from home.  We recommend to follow-up with psychiatry as an outpatient.  Currently holding olanzapine, trazodone for now.  We have requested psychiatry evaluation.  GERD: Continue PPI  History of seizure disorder: Takes Keppra.  Denies any recent history of seizures.  Order EEG.  Hyperlipidemia: Continue statin  Addendum: CT abd/pelvis showed mild diffuse colitis.Unclear etiology.Will start on abx for now.Will get GI pathogen panel.Low threshold to dc abx soon.No abd pain,fever or leucocytosis .Needs to have colonoscopy as outpatient in 6-8 weeks      DVT prophylaxis:enoxaparin (LOVENOX) injection 40 mg Start: 03/02/23 2200     Code Status: Full Code  Family Communication: Called and discussed with sister on phone on 4/9  Patient status: Inpatient  Patient is from : Home  Anticipated discharge to: SNF  Estimated DC date: 2 to 3 days   Consultants: None  Procedures: None  Antimicrobials:  Anti-infectives (From admission, onward)    None       Subjective: Patient seen and examined at bedside.  Hemodynamically stable.  She was sitting in the chair.  Looks very weak, deconditioned.  Complains of generalized weakness.  Denies any nausea, vomiting, abdominal pain, shortness of breath or cough.  Denies suicidal ideations or intentions.  Looks very apathetic, depressed  Objective: Vitals:   03/03/23 0725 03/03/23 0726 03/03/23 0730 03/03/23 0800  BP:   (!) 129/91 128/62  Pulse:   86 80  Resp:   (!) 21 14  Temp: 97.7 F (36.5 C)     TempSrc: Oral     SpO2:   100% 99%  Weight:  77.1 kg    Height:  5\' 6"  (1.676 m)      Intake/Output Summary (Last 24 hours) at 03/03/2023 0811 Last data filed at 03/02/2023 2334 Gross per 24 hour  Intake 1250 ml  Output --  Net 1250 ml   Filed Weights   03/03/23 0726  Weight: 77.1 kg    Examination:  General exam: Overall comfortable, not  in distress, chronically ill looking, weak HEENT: PERRL Respiratory system:  no wheezes or crackles  Cardiovascular system: S1 & S2 heard, RRR.  Gastrointestinal system: Abdomen is nondistended, soft and nontender. Central nervous system: Alert and oriented Extremities: No edema, no clubbing ,no cyanosis Skin: No rashes, no ulcers,no icterus     Data Reviewed: I have personally reviewed following labs and imaging studies  CBC: Recent Labs  Lab 03/02/23 1923 03/03/23 0432  WBC 8.2 6.0  HGB 13.4 12.8  HCT 41.2 40.7  MCV 87.5 89.5  PLT 221 197   Basic Metabolic Panel: Recent Labs  Lab 03/02/23 1923 03/02/23 1949 03/03/23 0432  NA 142  --  140  K 2.6*  --  3.7  CL 100  --  105  CO2 29  --  27  GLUCOSE 96  --  86  BUN 12  --  12  CREATININE 0.78  --  0.75  CALCIUM 8.3*  --  7.8*  MG  --  1.5* 1.9     No results found for this or any previous visit (from the past 240 hour(s)).   Radiology Studies: No results found.  Scheduled Meds:  atorvastatin  40 mg Oral Daily   enoxaparin (LOVENOX) injection  40 mg Subcutaneous Q24H   escitalopram  20 mg Oral Daily   levETIRAcetam  1,000 mg Oral BID   levothyroxine  50 mcg Oral Daily   OLANZapine  10 mg Oral QHS   pantoprazole  40 mg Oral Daily   traZODone  100 mg Oral QHS   Continuous Infusions:  0.9 % NaCl with KCl 40 mEq / L 125 mL/hr at 03/03/23 0102     LOS: 0 days   Burnadette Pop, MD [facility] P4/07/2023, 8:11 AM

## 2023-03-04 DIAGNOSIS — R569 Unspecified convulsions: Secondary | ICD-10-CM

## 2023-03-04 DIAGNOSIS — R55 Syncope and collapse: Secondary | ICD-10-CM

## 2023-03-04 DIAGNOSIS — R531 Weakness: Secondary | ICD-10-CM | POA: Diagnosis not present

## 2023-03-04 LAB — SEDIMENTATION RATE: Sed Rate: 4 mm/hr (ref 0–22)

## 2023-03-04 LAB — RAPID URINE DRUG SCREEN, HOSP PERFORMED
Amphetamines: NOT DETECTED
Barbiturates: NOT DETECTED
Benzodiazepines: NOT DETECTED
Cocaine: POSITIVE — AB
Opiates: NOT DETECTED
Tetrahydrocannabinol: POSITIVE — AB

## 2023-03-04 LAB — BASIC METABOLIC PANEL
Anion gap: 5 (ref 5–15)
BUN: 10 mg/dL (ref 6–20)
CO2: 24 mmol/L (ref 22–32)
Calcium: 8 mg/dL — ABNORMAL LOW (ref 8.9–10.3)
Chloride: 111 mmol/L (ref 98–111)
Creatinine, Ser: 0.67 mg/dL (ref 0.44–1.00)
GFR, Estimated: >60 mL/min (ref >60)
Glucose, Bld: 89 mg/dL (ref 70–99)
Potassium: 4.5 mmol/L (ref 3.5–5.1)
Sodium: 140 mmol/L (ref 135–145)

## 2023-03-04 LAB — MAGNESIUM: Magnesium: 1.9 mg/dL (ref 1.7–2.4)

## 2023-03-04 LAB — C-REACTIVE PROTEIN: CRP: 0.6 mg/dL (ref <1.0)

## 2023-03-04 MED ORDER — LOPERAMIDE HCL 2 MG PO CAPS
2.0000 mg | ORAL_CAPSULE | Freq: Four times a day (QID) | ORAL | Status: DC | PRN
  Administered 2023-03-04 – 2023-03-05 (×2): 2 mg via ORAL
  Filled 2023-03-04 (×2): qty 1

## 2023-03-04 MED ORDER — SODIUM CHLORIDE 0.9 % IV SOLN: INTRAVENOUS | Status: DC

## 2023-03-04 MED ORDER — GABAPENTIN 100 MG PO CAPS
100.0000 mg | ORAL_CAPSULE | Freq: Three times a day (TID) | ORAL | Status: DC
  Administered 2023-03-04 – 2023-03-05 (×4): 100 mg via ORAL
  Filled 2023-03-04 (×4): qty 1

## 2023-03-04 MED ORDER — MELATONIN 3 MG PO TABS
6.0000 mg | ORAL_TABLET | Freq: Every day | ORAL | Status: DC
  Administered 2023-03-05: 6 mg via ORAL
  Filled 2023-03-04 (×2): qty 2

## 2023-03-04 NOTE — TOC Progression Note (Signed)
Transition of Care [facility]) - Progression Note    Patient Details  Name: Sheryl Hall MRN: [id number] Date of Birth: 12/07/1960  Transition of Care [facility]) CM/SW Contact  Elliot Gault, LCSW Phone Number: 03/04/2023, 3:59 PM  Clinical Narrative:     TOC following. Spoke with Whitney at the central intake for the SNFs that made bed offers. Per Alphonzo Lemmings they can also offer from [facility]. Spoke with pt to update. She prefers to remain in [place].  Per Alphonzo Lemmings, they will have to submit for authorization and she will do that once bed offer accepted.  MD anticipating dc in 1-2 days. Will follow.  Expected Discharge Plan: Skilled Nursing Facility Barriers to Discharge: Continued Medical Work up  Expected Discharge Plan and Services In-house Referral: Clinical Social Work Discharge Planning Services: CM Consult Post Acute Care Choice: Skilled Nursing Facility Living arrangements for the past 2 months: Single Family Home                                       Social Determinants of Health (SDOH) Interventions SDOH Screenings   Food Insecurity: No Food Insecurity (03/03/2023)  Housing: Low Risk  (03/03/2023)  Transportation Needs: No Transportation Needs (03/03/2023)  Utilities: Not At Risk (03/03/2023)  Alcohol Screen: Low Risk  (07/18/2021)  Depression (PHQ2-9): High Risk (05/05/2022)  Tobacco Use: Medium Risk (03/02/2023)    Readmission Risk Interventions     No data to display

## 2023-03-04 NOTE — Consult Note (Addendum)
Telepsych Consultation   Reason for Consult:  "sever depression causing malnutrition" Referring Physician:  Burnadette Pop, MD  Location of Patient:  [phone] Location of Provider: Other: [facility]  Patient Identification: Sheryl Hall MRN:  [id number] Principal Diagnosis: Generalized weakness Diagnosis:  Principal Problem:   Generalized weakness Active Problems:   Anxiety   Depression   Bipolar disorder (HCC)   Hyperthyroidism   Hyperlipidemia   Hypokalemia   Hypomagnesemia   Weakness generalized   Total Time spent with patient: 15 minutes  Subjective:   Sheryl Hall is a 62 y.o. female presented to [facility] emergency department due to generalized weakness.  She has a charted history with major depressive disorder, generalized anxiety disorder and bipolar disorder.  Patient was seen and evaluated via teleassessment.  She presents flat, depressed and guarded. Patient is rating her depression 8/10 with 10 being the worst.   Sheryl Hall is  denying suicidal or homicidal ideations.  Denies auditory visual hallucinations.  States " my depression is worse since I was admitted to the hospital and I just cannot shake it."  States she has been feeling depression for a while states she recently schedule a follow-up appointment with "[facility]al [facility] on [address]  States she has a Multimedia programmer call appointment on April 18 th 2024.   Sheryl Hall stated she was followed by [facility] however, feels her medication has stopped working.  States she is currently prescribed Lexapro, Zyprexa, Keppra.  Which she has been taking and tolerating well.  Reports she does have episodes where she goes into severe depression however denies self injures behaviors or suicidal ideations.  She reports a poor appetite and states she has been getting 3 to 4 hours of sleep nightly.  Discussed initiating mirtazapine 7.5 mg nightly and patient to keep outpatient follow-up appointment.  She reports she resides  alone. Reported her support system is her sister and her sister's friend.  States she has a daughter who is 70 years old who resides in [place] who has plans to help take care of her she states she will be in town April 22 for 1 week.  Patient denied any illicit drug use or substance abuse history.  Denied access to guns or weapons in the home.  Chart review patient scheduled to follow-up with skilled nursing facility at discharge.  Recommendations starting Mirtazapine/Remeron. Please update chart with [facility] and monitor for QTC prolongation. QTC- 368/ 468 Patient to be cleared by psychiatry.  Case staffed with attending psychiatrist MD Lucianne Muss.  Support,encouragement and  reassurance was provided.   Sheryl Hall is resting in bed.she is alert/oriented x 4; calm/cooperative; and mood congruent with affect.  Patient is speaking in a clear tone at moderate volume, and normal pace; with good eye contact. Her thought process is coherent and relevant; There is no indication that she is currently responding to internal/external stimuli or experiencing delusional thought content.  Patient denies suicidal/self-harm/homicidal ideation, psychosis, and paranoia. Patient has remained calm throughout assessment and has answered questions appropriately.    HPI:  Per admission assessment note: "Sheryl Hall is a 62 y.o. female with medical history significant of hypertension, hyperlipidemia, GERD, hypothyroidism and many other presented to ED with several complaints.  Basically she started having weakness about 3 months ago.  Her sister is at the bedside who is providing additional information as well.  According to her, patient having history of anxiety, depression as well as bipolar disorder, oftentimes does not want to get out  and would be laying in the bed or in the chair for 24 hours.  Per patient, she basically started feeling weak 3 months ago.  But since about 3 to 4 weeks ago, she has had very poor appetite and she  is eating only little and has been drinking almost 3 sodas a day with no water but sometimes will drink some juice."  Past Psychiatric History:  Depression, anxiety, bipolar disorder  Risk to Self:   Risk to Others:   Prior Inpatient Therapy:   Prior Outpatient Therapy:    Past Medical History:  Past Medical History:  Diagnosis Date   Anxiety    Arthritis    Avascular necrosis of hip    left   Bipolar 1 disorder    Chronic left hip pain    Chronic nausea    Depression    Diverticulosis of colon    Family hx of colon cancer    age 61-father   Folate deficiency    GERD (gastroesophageal reflux disease)    Headache    Heart murmur    Hx of abnormal Pap smear    Hyperlipidemia    Hyperthyroidism    Keratosis, actinic    Low back pain 03/01/2013   MRI with multiple levels of disc bulge   Panic attacks    Polysubstance abuse    PTSD (post-traumatic stress disorder)    S/P colonoscopy 05/30/2010   LAX sphincter tone, anal papilla, left-sided diverticulosis, normal random biopsies,, 1 polyp-TA   S/P endoscopy 05/30/2010   Dr Rourk-> non--critical Schatzki's ring, s/p 3F dilation   Seizures    Stroke 2014   Right parietal, no deficits    Tubular adenoma of colon 05/30/2010   Next colonoscopy 05/2015   UTI (urinary tract infection)     Past Surgical History:  Procedure Laterality Date   COLONOSCOPY     every 5 years   COLONOSCOPY  05/30/2010   RMR:lax anal sphincter tone,anal papilla,otherwise normal/left-sided diverticula   ESOPHAGOGASTRODUODENOSCOPY  05/30/2010   ZOX:[id number] ring/small HH otherwise normal   EXTERNAL EAR SURGERY Left 12 years ago   skin graft from behind ear put in ear canal   JOINT REPLACEMENT     NECK SURGERY  10 years ago   SKIN CANCER DESTRUCTION     TOTAL HIP ARTHROPLASTY Left 04/19/2014   Procedure: LEFT TOTAL HIP ARTHROPLASTY ANTERIOR APPROACH;  Surgeon: Loanne Drilling, MD;  Location: [facility];  Service: Orthopedics;  Laterality: Left;   TOTAL  HIP ARTHROPLASTY Right 05/23/2015   Procedure: RIGHT TOTAL HIP ARTHROPLASTY ANTERIOR APPROACH;  Surgeon: Ollen Gross, MD;  Location: [facility];  Service: Orthopedics;  Laterality: Right;   Family History:  Family History  Problem Relation Age of Onset   Diabetes Mother    Hypertension Mother    Colon cancer Father 73       living    Hypertension Father    Hypertension Sister    Heart attack Brother    Seizures Maternal Aunt    Family Psychiatric  History:  Social History:  Social History   Substance and Sexual Activity  Alcohol Use No   Alcohol/week: 0.0 standard drinks of alcohol     Social History   Substance and Sexual Activity  Drug Use No   Comment: hx cocaine abuse    Social History   Socioeconomic History   Marital status: Divorced    Spouse name: Not on file   Number of children: 1   Years of  education: Not on file   Highest education level: Not on file  Occupational History   Occupation: disabled  Tobacco Use   Smoking status: Former    Packs/day: 0.50    Years: 30.00    Additional pack years: 0.00    Total pack years: 15.00    Types: Cigarettes    Quit date: 04/24/2022    Years since quitting: 0.8   Smokeless tobacco: Never   Tobacco comments:    trying to quit  Vaping Use   Vaping Use: Never used  Substance and Sexual Activity   Alcohol use: No    Alcohol/week: 0.0 standard drinks of alcohol   Drug use: No    Comment: hx cocaine abuse   Sexual activity: Never    Birth control/protection: None  Other Topics Concern   Not on file  Social History Narrative   Patient lives at home alone and she is single.    Disabled.   Education college education.   Right handed.   Caffeine mountain dew four daily.    Social Determinants of Health   Financial Resource Strain: Not on file  Food Insecurity: No Food Insecurity (03/03/2023)   Hunger Vital Sign    Worried About Running Out of Food in the Last Year: Never true    Ran Out of Food in the Last Year:  Never true  Transportation Needs: No Transportation Needs (03/03/2023)   PRAPARE - Administrator, Civil ServiceTransportation    Lack of Transportation (Medical): No    Lack of Transportation (Non-Medical): No  Physical Activity: Not on file  Stress: Not on file  Social Connections: Not on file   Additional Social History:    Allergies:  No Known Allergies  Labs:  Results for orders placed or performed during the hospital encounter of 03/02/23 (from the past 48 hour(s))  Lipase, blood     Status: None   Collection Time: 03/02/23  7:23 PM  Result Value Ref Range   Lipase 27 11 - 51 U/L    Comment: Performed at [facility], [address] [place], [place] [address]  Comprehensive metabolic panel     Status: Abnormal   Collection Time: 03/02/23  7:23 PM  Result Value Ref Range   Sodium 142 135 - 145 mmol/L   Potassium 2.6 (LL) 3.5 - 5.1 mmol/L    Comment: CRITICAL RESULT CALLED TO, READ BACK BY AND VERIFIED WITH FRAZIER,A ON 03/02/23 AT 2010 BY LOY,C   Chloride 100 98 - 111 mmol/L   CO2 29 22 - 32 mmol/L   Glucose, Bld 96 70 - 99 mg/dL    Comment: Glucose reference range applies only to samples taken after fasting for at least 8 hours.   BUN 12 6 - 20 mg/dL   Creatinine, Ser 6.040.78 0.44 - 1.00 mg/dL   Calcium 8.3 (L) 8.9 - 10.3 mg/dL   Total Protein 5.4 (L) 6.5 - 8.1 g/dL   Albumin 2.8 (L) 3.5 - 5.0 g/dL   AST 540105 (H) 15 - 41 U/L   ALT 39 0 - 44 U/L   Alkaline Phosphatase 62 38 - 126 U/L   Total Bilirubin 0.7 0.3 - 1.2 mg/dL   GFR, Estimated >98>60 >11>60 mL/min    Comment: (NOTE) Calculated using the CKD-EPI Creatinine Equation (2021)    Anion gap 13 5 - 15    Comment: Performed at [facility], [address] [place], [place] [address]  CBC     Status: Abnormal   Collection Time: 03/02/23  7:23 PM  Result Value Ref Range   WBC 8.2 4.0 - 10.5 K/uL   RBC 4.71 3.87 - 5.11 MIL/uL   Hemoglobin 13.4 12.0 - 15.0 g/dL   HCT 95.2 84.1 - 32.4 %   MCV 87.5 80.0 - 100.0 fL   MCH 28.5 26.0 - 34.0 pg   MCHC 32.5  30.0 - 36.0 g/dL   RDW 40.1 (H) 02.7 - 25.3 %   Platelets 221 150 - 400 K/uL   nRBC 0.0 0.0 - 0.2 %    Comment: Performed at [facility], [address] [place], [place] [address]  POC CBG, ED     Status: Abnormal   Collection Time: 03/02/23  7:48 PM  Result Value Ref Range   Glucose-Capillary 109 (H) 70 - 99 mg/dL    Comment: Glucose reference range applies only to samples taken after fasting for at least 8 hours.  Hemoglobin A1c     Status: Abnormal   Collection Time: 03/02/23  7:48 PM  Result Value Ref Range   Hgb A1c MFr Bld 5.7 (H) 4.8 - 5.6 %    Comment: (NOTE) Pre diabetes:          5.7%-6.4%  Diabetes:              >6.4%  Glycemic control for   <7.0% adults with diabetes    Mean Plasma Glucose 116.89 mg/dL    Comment: Performed at [facility], [address], [place], [place] [address]  Magnesium     Status: Abnormal   Collection Time: 03/02/23  7:49 PM  Result Value Ref Range   Magnesium 1.5 (L) 1.7 - 2.4 mg/dL    Comment: Performed at [facility], [address], [place], [place] [address]  TSH     Status: None   Collection Time: 03/02/23  7:49 PM  Result Value Ref Range   TSH 2.276 0.350 - 4.500 uIU/mL    Comment: Performed by a 3rd Generation assay with a functional sensitivity of <=0.01 uIU/mL. Performed at [facility], [address] [place], [place] [address]   Vitamin B12     Status: Abnormal   Collection Time: 03/02/23  7:49 PM  Result Value Ref Range   Vitamin B-12 138 (L) 180 - 914 pg/mL    Comment: (NOTE) This assay is not validated for testing neonatal or myeloproliferative syndrome specimens for Vitamin B12 levels. Performed at [facility], [address], [place], [place] [address]   Folate     Status: None   Collection Time: 03/02/23  7:49 PM  Result Value Ref Range   Folate 14.3 >5.9 ng/mL    Comment: Performed at [facility], [address], [place], [place] [address]  Basic metabolic panel     Status: Abnormal   Collection Time:  03/03/23  4:32 AM  Result Value Ref Range   Sodium 140 135 - 145 mmol/L   Potassium 3.7 3.5 - 5.1 mmol/L   Chloride 105 98 - 111 mmol/L   CO2 27 22 - 32 mmol/L   Glucose, Bld 86 70 - 99 mg/dL    Comment: Glucose reference range applies only to samples taken after fasting for at least 8 hours.   BUN 12 6 - 20 mg/dL   Creatinine, Ser 6.60 0.44 - 1.00 mg/dL   Calcium 7.8 (L) 8.9 - 10.3 mg/dL   GFR, Estimated >63 >01 mL/min    Comment: (NOTE) Calculated using the CKD-EPI Creatinine Equation (2021)    Anion  gap 8 5 - 15    Comment: Performed at [facility], [address], [place], [place] [address]  CBC     Status: Abnormal   Collection Time: 03/03/23  4:32 AM  Result Value Ref Range   WBC 6.0 4.0 - 10.5 K/uL   RBC 4.55 3.87 - 5.11 MIL/uL   Hemoglobin 12.8 12.0 - 15.0 g/dL   HCT 17.7 11.6 - 57.9 %   MCV 89.5 80.0 - 100.0 fL   MCH 28.1 26.0 - 34.0 pg   MCHC 31.4 30.0 - 36.0 g/dL   RDW 03.8 (H) 33.3 - 83.2 %   Platelets 197 150 - 400 K/uL   nRBC 0.0 0.0 - 0.2 %    Comment: Performed at [facility], [address] [place], [place] [address]  Magnesium     Status: None   Collection Time: 03/03/23  4:32 AM  Result Value Ref Range   Magnesium 1.9 1.7 - 2.4 mg/dL    Comment: Performed at [facility], [address], [place], [place] [address]  HIV Antibody (routine testing w rflx)     Status: None   Collection Time: 03/03/23  4:32 AM  Result Value Ref Range   HIV Screen 4th Generation wRfx Non Reactive Non Reactive    Comment: Performed at [facility], [address] [place], [place] [address]  Phosphorus     Status: None   Collection Time: 03/03/23  4:45 AM  Result Value Ref Range   Phosphorus 2.7 2.5 - 4.6 mg/dL    Comment: Performed at [facility], [address] [place], [place] [address]  C-reactive protein     Status: None   Collection Time: 03/03/23  4:58 AM  Result Value Ref Range   CRP 0.6 <1.0 mg/dL    Comment: Performed at [facility], [address] [place], [place] [address]  Glucose, capillary     Status: None   Collection Time: 03/03/23 10:35 AM  Result Value Ref Range   Glucose-Capillary 72 70 - 99 mg/dL    Comment: Glucose reference range applies only to samples taken after fasting for at least 8 hours.  Basic metabolic panel     Status: Abnormal   Collection Time: 03/04/23  4:22 AM  Result Value Ref Range   Sodium 140 135 - 145 mmol/L   Potassium 4.5 3.5 - 5.1 mmol/L   Chloride 111 98 - 111 mmol/L   CO2 24 22 - 32 mmol/L   Glucose, Bld 89 70 - 99 mg/dL    Comment: Glucose reference range applies only to samples taken after fasting for at least 8 hours.   BUN 10 6 - 20 mg/dL   Creatinine, Ser 0.23 0.44 - 1.00 mg/dL   Calcium 8.0 (L) 8.9 - 10.3 mg/dL   GFR, Estimated >34 >35 mL/min    Comment: (NOTE) Calculated using the CKD-EPI Creatinine Equation (2021)    Anion gap 5 5 - 15    Comment: Performed at [facility], [address] [place], [place] [address]  Magnesium     Status: None   Collection Time: 03/04/23  4:22 AM  Result Value Ref Range   Magnesium 1.9 1.7 - 2.4 mg/dL    Comment: Performed at [facility], [address], [place], [place] [address]  Sedimentation rate     Status: None   Collection Time: 03/04/23  9:28 AM  Result Value Ref Range   Sed Rate 4 0 - 22 mm/hr  Comment: Performed at [facility], [address], [place], [place] [address]    Medications:  Current Facility-Administered Medications  Medication Dose Route Frequency Provider Last Rate Last Admin   0.9 %  sodium chloride infusion   Intravenous Continuous Burnadette Pop, MD 75 mL/hr at 03/04/23 1102 New Bag at 03/04/23 1102   acetaminophen (TYLENOL) tablet 650 mg  650 mg Oral Q6H PRN Hughie Closs, MD   650 mg at 03/04/23 0011   Or   acetaminophen (TYLENOL) suppository 650 mg  650 mg Rectal Q6H PRN Hughie Closs, MD       atorvastatin (LIPITOR) tablet 40 mg  40 mg Oral Daily Pahwani, Daleen Bo, MD   40 mg at 03/04/23 1054   cefTRIAXone  (ROCEPHIN) 2 g in sodium chloride 0.9 % 100 mL IVPB  2 g Intravenous Q24H Burnadette Pop, MD   Stopped at 03/03/23 1724   cyanocobalamin (VITAMIN B12) injection 1,000 mcg  1,000 mcg Intramuscular Daily Burnadette Pop, MD   1,000 mcg at 03/04/23 1054   enoxaparin (LOVENOX) injection 40 mg  40 mg Subcutaneous Q24H Hughie Closs, MD   40 mg at 03/03/23 2043   escitalopram (LEXAPRO) tablet 20 mg  20 mg Oral Daily Pahwani, Daleen Bo, MD   20 mg at 03/04/23 1054   feeding supplement (ENSURE ENLIVE / ENSURE PLUS) liquid 237 mL  237 mL Oral BID BM Adhikari, Amrit, MD   237 mL at 03/04/23 1055   gabapentin (NEURONTIN) capsule 100 mg  100 mg Oral TID Burnadette Pop, MD   100 mg at 03/04/23 1054   levETIRAcetam (KEPPRA) tablet 1,000 mg  1,000 mg Oral BID Hughie Closs, MD   1,000 mg at 03/04/23 1054   levothyroxine (SYNTHROID) tablet 50 mcg  50 mcg Oral Daily Hughie Closs, MD   50 mcg at 03/04/23 6045   loperamide (IMODIUM) capsule 2 mg  2 mg Oral Q6H PRN Burnadette Pop, MD   2 mg at 03/04/23 1132   metroNIDAZOLE (FLAGYL) IVPB 500 mg  500 mg Intravenous Q12H Burnadette Pop, MD 100 mL/hr at 03/04/23 0529 500 mg at 03/04/23 0529   multivitamin-lutein (OCUVITE-LUTEIN) capsule 1 capsule  1 capsule Oral Daily Burnadette Pop, MD   1 capsule at 03/04/23 1054   ondansetron (ZOFRAN) tablet 4 mg  4 mg Oral Q6H PRN Hughie Closs, MD   4 mg at 03/03/23 0518   Or   ondansetron (ZOFRAN) injection 4 mg  4 mg Intravenous Q6H PRN Hughie Closs, MD   4 mg at 03/04/23 0854   pantoprazole (PROTONIX) EC tablet 40 mg  40 mg Oral Daily Hughie Closs, MD   40 mg at 03/04/23 1054   prochlorperazine (COMPAZINE) injection 10 mg  10 mg Intravenous Q6H PRN Burnadette Pop, MD        Musculoskeletal: Strength & Muscle Tone: within normal limits Gait & Station: normal Patient leans: N/A          Psychiatric Specialty Exam:  Presentation  General Appearance:  Appropriate for Environment  Eye  Contact: Good  Speech: Clear and Coherent  Speech Volume: Normal  Handedness:No data recorded  Mood and Affect  Mood: Depressed  Affect: Congruent   Thought Process  Thought Processes: Coherent  Descriptions of Associations:Intact  Orientation:Full (Time, Place and Person)  Thought Content:Logical  History of Schizophrenia/Schizoaffective disorder:No data recorded Duration of Psychotic Symptoms:No data recorded Hallucinations:Hallucinations: None  Ideas of Reference:None  Suicidal Thoughts:Suicidal Thoughts: No  Homicidal Thoughts:Homicidal Thoughts: No   Sensorium  Memory: Recent Good; Remote  Good  Judgment: Fair  Insight: Good   Executive Functions  Concentration: Fair  Attention Span: Good  Recall: Dudley Major of Knowledge: Good  Language: Good   Psychomotor Activity  Psychomotor Activity: Psychomotor Activity: Other (comment) (unable to assess)   Assets  Assets: Desire for Improvement; Social Support   Sleep  Sleep: Sleep: Fair Number of Hours of Sleep: 4    Physical Exam: Physical Exam Vitals and nursing note reviewed.  Neurological:     Mental Status: She is oriented to person, place, and time.  Psychiatric:        Mood and Affect: Mood normal.        Behavior: Behavior normal.    Review of Systems  Psychiatric/Behavioral:  Positive for depression. Negative for hallucinations, substance abuse and suicidal ideas. The patient is nervous/anxious.   All other systems reviewed and are negative.  Blood pressure 135/60, pulse 85, temperature 97.9 F (36.6 C), resp. rate 18, height 5\' 6"  (1.676 m), weight 74.5 kg, SpO2 99 %. Body mass index is 26.51 kg/m.  Treatment Plan Summary: Daily contact with patient to assess and evaluate symptoms and progress in treatment and Medication management  Consider initiating Remeron 7.5 mg p.o. nightly to assist with mood stabilization. -Insomnia and appetite -will need a  updated EKG- otc 368/461 -Keep all outpatient follow-up appointments  Disposition: No evidence of imminent risk to self or others at present.   Patient does not meet criteria for psychiatric inpatient admission. Supportive therapy provided about ongoing stressors. Refer to IOP. Discussed crisis plan, support from social network, calling 911, coming to the Emergency Department, and calling Suicide Hotline.  This service was provided via telemedicine using a 2-way, interactive audio and video technology.  Names of all persons participating in this telemedicine service and their role in this encounter. Name:  Shyonna Knuppel  Role: patient   Name: T.Quaron Delacruz  Role: NP           Oneta Rack, NP 03/04/2023 12:14 PM

## 2023-03-04 NOTE — Consult Note (Signed)
NP spoke to unit secretary Pieter Partridge unable to locate teleassessment cart.  Will follow-up once cart is located.

## 2023-03-04 NOTE — Progress Notes (Addendum)
PROGRESS NOTE  Sheryl Hall  ZOX:[id number] DOB: 03/23/1961 DOA: 03/02/2023 PCP: Donita BrooksPickard, Warren T, MD   Brief Narrative: Patient is a 62 year old female with history of hypertension, hyperlipidemia, GERD, hypothyroidism who presented with generalized weakness, dizziness, poor appetite.  Started feeling weak 3 months ago.  Has very low oral intake since last 3 to 4 weeks.  History of weight loss of 20 to 25 pounds in last 2 to 3 months.  Also complains of severe pain and tingling and burning on bilateral lower extremity.  On presentation, she was hemodynamically stable.  Lab work showed potassium of 2.6, low magnesium.  Patient admitted for further workup.  PT OT consulted, recommended SNF.  Vitamin B12 found to be low,started supplementation.  Assessment & Plan:  Principal Problem:   Generalized weakness Active Problems:   Anxiety   Depression   Bipolar disorder (HCC)   Hyperthyroidism   Hyperlipidemia   Hypokalemia   Hypomagnesemia   Weakness generalized   Generalized weakness/failure to thrive: Not eating, drinking since last several weeks.  Complain of generalized weakness, weight loss.  Found to be severely deficient in vitamin B12.   TSH normal. PT/OT, dietitian consulted. PT recommending SNF on discharge.  TOC following. Her depression cud be also contributing to this. She is not suicidal. Will check ESR, CRP to rule out polymyalgia rheumatica.  She complains of generalized weakness and pain everywhere.  Vitamin B12 deficiency: Vitamin B12 138.  Continue daily IM supplementation until discharge with oral supplement.    Depression: Complains of severe depression but denies any suicidal ideations or thoughts.  Psychiatry consulted  Hypomagnesemia/hypokalemia: Supplemented and corrected  Dehydration: Secondary to poor oral intake.  Started on IV fluids.  Renal function intact.  Syncopal episode: Happened in the morning of 4/9.  Blood pressure stable.  Continue gentle IV  fluids for today  Tingling/burning of bilateral lower extremity: This is secondary to vitamin B12 deficiency causing neuropathy.  Continue supplementation. Started on low-dose gabapentin  Hypothyroidism: Continue Synthyroid.  TSH normal.  History of bipolar disorder/anxiety/depression: On Lexapro, olanzapine, trazodone from home.  We recommend to follow-up with psychiatry as an outpatient.  Currently holding olanzapine, trazodone for now.  We have requested psychiatry evaluation.  GERD: Continue PPI  History of seizure disorder: Takes Keppra.  Denies any recent history of seizures.  No evidence of seizure as per EEG.  Hyperlipidemia: Continue statin  Colitis:CT abd/pelvis showed mild diffuse colitis.Unclear etiology.No abd pain,fever or leucocytosis .will check GI pathogen panel, C. difficile.  Today she has diarrhea so bowel regimen discontinued, ordered Imodium. She needs to have colonoscopy as outpatient in 6-8 weeks  Nutrition Problem: Inadequate oral intake Etiology: decreased appetite, acute illness    DVT prophylaxis:enoxaparin (LOVENOX) injection 40 mg Start: 03/02/23 2200     Code Status: Full Code  Family Communication: Called and discussed with sister on phone on 4/9  Patient status: Inpatient  Patient is from : Home  Anticipated discharge to: SNF  Estimated DC date: 2 to 3 days   Consultants: None  Procedures: None  Antimicrobials:  Anti-infectives (From admission, onward)    Start     Dose/Rate Route Frequency Ordered Stop   03/03/23 1645  cefTRIAXone (ROCEPHIN) 2 g in sodium chloride 0.9 % 100 mL IVPB        2 g 200 mL/hr over 30 Minutes Intravenous Every 24 hours 03/03/23 1553     03/03/23 1645  metroNIDAZOLE (FLAGYL) IVPB 500 mg        500  mg 100 mL/hr over 60 Minutes Intravenous Every 12 hours 03/03/23 1553         Subjective: Patient seen and examined the bedside today.  Hemodynamically stable lying in bed.  She looks more comfortable than  yesterday but says that she  continues to be very weak.  She has pain on bilateral lower extremities.  Examination was unremarkable.  Objective: Vitals:   03/03/23 1334 03/03/23 1605 03/03/23 2018 2023-03-27 0531  BP: (!) 108/50 118/64 (!) 129/56 135/60  Pulse: 89 91 89 85  Resp: 16 20 20 18   Temp: 98.2 F (36.8 C) 98.9 F (37.2 C) 98.5 F (36.9 C) 97.9 F (36.6 C)  TempSrc: Oral Oral Oral   SpO2: 99% 99% 99% 99%  Weight:    74.5 kg  Height:        Intake/Output Summary (Last 24 hours) at 27-Mar-2023 1104 Last data filed at Mar 27, 2023 0900 Gross per 24 hour  Intake 3495.17 ml  Output --  Net 3495.17 ml   Filed Weights   03/03/23 0726 03/03/23 0824 03/27/2023 0531  Weight: 77.1 kg 72.7 kg 74.5 kg    Examination:  General exam: Overall comfortable, not in distress,weak appearing HEENT: PERRL Respiratory system:  no wheezes or crackles  Cardiovascular system: S1 & S2 heard, RRR.  Gastrointestinal system: Abdomen is nondistended, soft and nontender. Central nervous system: Alert and oriented Extremities: No edema, no clubbing ,no cyanosis Skin: No rashes, no ulcers,no icterus     Data Reviewed: I have personally reviewed following labs and imaging studies  CBC: Recent Labs  Lab 03/02/23 1923 03/03/23 0432  WBC 8.2 6.0  HGB 13.4 12.8  HCT 41.2 40.7  MCV 87.5 89.5  PLT 221 197   Basic Metabolic Panel: Recent Labs  Lab 03/02/23 1923 03/02/23 1949 03/03/23 0432 03/03/23 0445 03-27-2023 0422  NA 142  --  140  --  140  K 2.6*  --  3.7  --  4.5  CL 100  --  105  --  111  CO2 29  --  27  --  24  GLUCOSE 96  --  86  --  89  BUN 12  --  12  --  10  CREATININE 0.78  --  0.75  --  0.67  CALCIUM 8.3*  --  7.8*  --  8.0*  MG  --  1.5* 1.9  --  1.9  PHOS  --   --   --  2.7  --      No results found for this or any previous visit (from the past 240 hour(s)).   Radiology Studies: EEG adult  Result Date: 27-Mar-2023 Sheryl Quest, MD     03/27/2023  8:49 AM  Patient Name: Sheryl Hall MRN: [id number] Epilepsy Attending: Charlsie Hall Referring Physician/Provider: Burnadette Pop, MD Date: 03/03/2023 Duration: 22.07 mins Patient history: 62yo F with syncope. EEG to evaluate for seizure Level of alertness: Awake AEDs during EEG study: LEV Technical aspects: This EEG study was done with scalp electrodes positioned according to the 10-20 International system of electrode placement. Electrical activity was reviewed with band pass filter of 1-70Hz , sensitivity of 7 uV/mm, display speed of 27mm/sec with a 60Hz  notched filter applied as appropriate. EEG data were recorded continuously and digitally stored.  Video monitoring was available and reviewed as appropriate. Description: The posterior dominant rhythm consists of 9 Hz activity of moderate voltage (25-35 uV) seen predominantly in posterior head regions, symmetric and reactive to eye  opening and eye closing. Hyperventilation and photic stimulation were not performed.   IMPRESSION: This study is within normal limits. No seizures or epileptiform discharges were seen throughout the recording. A normal interictal EEG does not exclude the diagnosis of epilepsy. Priyanka Annabelle Harman   CT ABDOMEN PELVIS WO CONTRAST  Result Date: 03/03/2023 CLINICAL DATA:  Weakness x3 months, evaluate for malignancy EXAM: CT ABDOMEN AND PELVIS WITHOUT CONTRAST TECHNIQUE: Multidetector CT imaging of the abdomen and pelvis was performed following the standard protocol without IV contrast. RADIATION DOSE REDUCTION: This exam was performed according to the departmental dose-optimization program which includes automated exposure control, adjustment of the mA and/or kV according to patient size and/or use of iterative reconstruction technique. COMPARISON:  CT done on 11/07/2013, abdomen sonogram done on 04/16/2016 FINDINGS: Lower chest: No acute findings are seen. Hepatobiliary: No focal abnormalities are seen in liver. There is no dilation of bile  ducts. There is no wall thickening in gallbladder. Pancreas: No focal abnormalities are seen. Spleen: Unremarkable. Adrenals/Urinary Tract: Adrenals are unremarkable. There is no hydronephrosis. There are no renal or ureteral stones. Evaluation of urinary bladder is limited by severe beam hardening artifacts. Bladder is not distended. Stomach/Bowel: Stomach is unremarkable. Small bowel loops are not dilated. Appendix is not dilated. There is mild diffuse wall thickening in right:. There is decreased density in submucosal region in right colon. There is no significant focal wall thickening. Scattered diverticula are seen in: Without focal acute diverticulitis. There is incomplete distention of sigmoid colon. There is no pericolic stranding or fluid collection. Vascular/Lymphatic: Scattered arterial calcifications are seen. No significant lymphadenopathy is seen. Reproductive: Evaluation is limited due to severe beam hardening artifacts. Other: There is no ascites or pneumoperitoneum. Small umbilical hernia containing fat is seen. There is mild subcutaneous edema without any loculated fluid collections. Musculoskeletal: There is previous arthroplasty in both hips. No acute findings are seen. IMPRESSION: There is no evidence of intestinal obstruction or pneumoperitoneum. Appendix is not dilated. There is no hydronephrosis. Scattered diverticula seen in colon without signs of focal acute diverticulitis. There is mild diffuse wall thickening in ascending and proximal transverse colon. There is low-density in submucosal location in right colon. These findings may be due to incomplete distention or suggest chronic nonspecific colitis. There is no loculated pericolic fluid collection. There is mild diffuse wall thickening in sigmoid colon which may be due to incomplete distention or due to chronic diverticulitis. If there are continued symptoms, follow-up colonoscopy may be considered to evaluate for any underlying  neoplastic process. Electronically Signed   By: Ernie Avena M.D.   On: 03/03/2023 15:10    Scheduled Meds:  atorvastatin  40 mg Oral Daily   cyanocobalamin  1,000 mcg Intramuscular Daily   enoxaparin (LOVENOX) injection  40 mg Subcutaneous Q24H   escitalopram  20 mg Oral Daily   feeding supplement  237 mL Oral BID BM   gabapentin  100 mg Oral TID   levETIRAcetam  1,000 mg Oral BID   levothyroxine  50 mcg Oral Daily   multivitamin-lutein  1 capsule Oral Daily   pantoprazole  40 mg Oral Daily   Continuous Infusions:  sodium chloride 75 mL/hr at 03/04/23 1102   cefTRIAXone (ROCEPHIN)  IV Stopped (03/03/23 1724)   metronidazole 500 mg (03/04/23 0529)     LOS: 1 day   Sheryl Pop, MD [facility] P4/08/2023, 11:04 AM

## 2023-03-04 NOTE — Progress Notes (Signed)
Patient slept on and off this shift, patient did how bowel movements. Was unable to get a stool sample due to urine in with sample.  Patient did not attempt to ambulated this shift she used bedpan. Continued to monitor.

## 2023-03-04 NOTE — Progress Notes (Signed)
Physical Therapy Treatment Patient Details Name: Sheryl Hall MRN: [id number] DOB: [birth year] Today's Date: 03/04/2023   History of Present Illness Sheryl Hall is a 62 y.o. female with medical history significant of hypertension, hyperlipidemia, GERD, hypothyroidism and many other presented to ED with several complaints.  Basically she started having weakness about 3 months ago.  Her sister is at the bedside who is providing additional information as well.  According to her, patient having history of anxiety, depression as well as bipolar disorder, oftentimes does not want to get out and would be laying in the bed or in the chair for 24 hours.  Per patient, she basically started feeling weak 3 months ago.  But since about 3 to 4 weeks ago, she has had very poor appetite and she is eating only little and has been drinking almost 3 sodas a day with no water but sometimes will drink some juice.  Per sister, she is barely eating 300 cal a day.  Per patient, she has lost approximately 20 to 25 pounds in about 2 to 3 months.  She denies any chest pain, abdominal pain, shortness of breath, nausea, vomiting, any problem with urination or with bowel movements.  Per sister, she has been out of her home only once in 3 months and that 2 was for doctor's visit.  Patient claims that she takes all her medications regularly including Synthroid.  She does carry a history of GERD.  Additionally, patient is complaining of severe pain and tingling and burning in bilateral feet which started about 2 months ago.    PT Comments    Pt with c/o neck and back pain scale 5/10 and pins and needles Bil feet, monitored pain through session.  Pt nervous through session, pt assured safe mechanics with AD and gait belt through session.  Pt c/o dizziness following transition from supine to sit as well as standing, pt educated that can happen when laying in bed multiple days and increased weakness.  Pt completed standing weight  shifting and sidestepping in front of bed, no forward gait due to c/o dizziness.  Vitals assessed WNL.  EOS pt left in bed with call bell within reach.  Educated on bed exercises for core/gluteal strengthening and mobility exercises.      Recommendations for follow up therapy are one component of a multi-disciplinary discharge planning process, led by the attending physician.  Recommendations may be updated based on patient status, additional functional criteria and insurance authorization.  Follow Up Recommendations       Assistance Recommended at Discharge    Patient can return home with the following A lot of help with walking and/or transfers;A little help with bathing/dressing/bathroom;Help with stairs or ramp for entrance;Assistance with cooking/housework   Equipment Recommendations  None recommended by PT    Recommendations for Other Services       Precautions / Restrictions Precautions Precautions: Fall Restrictions Weight Bearing Restrictions: No     Mobility  Bed Mobility Overal bed mobility: Modified Independent Bed Mobility: Supine to Sit     Supine to sit: Supervision     General bed mobility comments: increased time, labored movement    Transfers Overall transfer level: Needs assistance Equipment used: Rolling walker (2 wheels) Transfers: Sit to/from Stand Sit to Stand: Min assist           General transfer comment: unsteady labord movement with c/o dizziness    Ambulation/Gait Ambulation/Gait assistance: Min assist Gait Distance (Feet): 4 Feet Assistive device: Rolling  walker (2 wheels) Gait Pattern/deviations: Decreased step length - right, Decreased step length - left, Decreased stride length       General Gait Details: limited to a few slow labored side steps at bedside due to c/o generalized weakness and dizziness   Stairs             Wheelchair Mobility    Modified Rankin (Stroke Patients Only)       Balance                                             Cognition Arousal/Alertness: Awake/alert Behavior During Therapy: WFL for tasks assessed/performed Overall Cognitive Status: Within Functional Limits for tasks assessed                                          Exercises General Exercises - Lower Extremity Ankle Circles/Pumps: AROM, Both, 10 reps, Seated Gluteal Sets: Both, 10 reps, Supine Hip ABduction/ADduction: AROM, Both, 10 reps, Seated Hip Flexion/Marching: AROM, Both, Seated Other Exercises Other Exercises: Bridge x 5, LTR x 10, abdominal sets    General Comments        Pertinent Vitals/Pain Pain Assessment Pain Score: 6  Pain Location: feet, stabbing LBP Pain Descriptors / Indicators: Pins and needles, Stabbing Pain Intervention(s): Limited activity within patient's tolerance, Monitored during session, Repositioned    Home Living                          Prior Function            PT Goals (current goals can now be found in the care plan section)      Frequency    Min 3X/week      PT Plan      Co-evaluation              AM-PAC PT "6 Clicks" Mobility   Outcome Measure  Help needed turning from your back to your side while in a flat bed without using bedrails?: None Help needed moving from lying on your back to sitting on the side of a flat bed without using bedrails?: A Little Help needed moving to and from a bed to a chair (including a wheelchair)?: A Little Help needed standing up from a chair using your arms (e.g., wheelchair or bedside chair)?: A Little Help needed to walk in hospital room?: A Lot Help needed climbing 3-5 steps with a railing? : A Lot 6 Click Score: 17    End of Session Equipment Utilized During Treatment: Gait belt Activity Tolerance: Patient tolerated treatment well;Patient limited by fatigue Patient left: in bed;with call bell/phone within reach Nurse Communication: Mobility status PT Visit  Diagnosis: Unsteadiness on feet (R26.81);Other abnormalities of gait and mobility (R26.89);Muscle weakness (generalized) (M62.81)     Time: 0301-3143 PT Time Calculation (min) (ACUTE ONLY): 23 min  Charges:  $Therapeutic Activity: 23-37 mins                     Becky Sax, LPTA/CLT; CBIS [phone]  Juel Burrow 03/04/2023, 3:58 PM

## 2023-03-04 NOTE — Procedures (Signed)
Patient Name: Sheryl Hall  MRN: [id number]  Epilepsy Attending: Charlsie Quest  Referring Physician/Provider: Burnadette Pop, MD  Date: 03/03/2023  Duration: 22.07 mins  Patient history: 62yo F with syncope. EEG to evaluate for seizure  Level of alertness: Awake  AEDs during EEG study: LEV  Technical aspects: This EEG study was done with scalp electrodes positioned according to the 10-20 International system of electrode placement. Electrical activity was reviewed with band pass filter of 1-70Hz , sensitivity of 7 uV/mm, display speed of 26mm/sec with a 60Hz  notched filter applied as appropriate. EEG data were recorded continuously and digitally stored.  Video monitoring was available and reviewed as appropriate.  Description: The posterior dominant rhythm consists of 9 Hz activity of moderate voltage (25-35 uV) seen predominantly in posterior head regions, symmetric and reactive to eye opening and eye closing. Hyperventilation and photic stimulation were not performed.     IMPRESSION: This study is within normal limits. No seizures or epileptiform discharges were seen throughout the recording.  A normal interictal EEG does not exclude the diagnosis of epilepsy.  Jemuel Laursen Annabelle Harman

## 2023-03-05 DIAGNOSIS — R531 Weakness: Secondary | ICD-10-CM | POA: Diagnosis not present

## 2023-03-05 MED ORDER — GABAPENTIN 100 MG PO CAPS
200.0000 mg | ORAL_CAPSULE | Freq: Three times a day (TID) | ORAL | Status: DC
  Administered 2023-03-05 – 2023-03-06 (×3): 200 mg via ORAL
  Filled 2023-03-05 (×3): qty 2

## 2023-03-05 MED ORDER — MIRTAZAPINE 15 MG PO TABS
7.5000 mg | ORAL_TABLET | Freq: Every day | ORAL | Status: DC
  Administered 2023-03-05: 7.5 mg via ORAL
  Filled 2023-03-05: qty 1

## 2023-03-05 MED ORDER — VITAMIN B-12 1000 MCG PO TABS
1000.0000 ug | ORAL_TABLET | Freq: Every day | ORAL | Status: DC
  Administered 2023-03-06: 1000 ug via ORAL
  Filled 2023-03-05: qty 1

## 2023-03-05 NOTE — Progress Notes (Signed)
Physical Therapy Treatment Patient Details Name: Sheryl Hall MRN: [id number] DOB: 1961-02-18 Today's Date: 03/05/2023   History of Present Illness Sheryl Hall is a 62 y.o. female with medical history significant of hypertension, hyperlipidemia, GERD, hypothyroidism and many other presented to ED with several complaints.  Basically she started having weakness about 3 months ago.  Her sister is at the bedside who is providing additional information as well.  According to her, patient having history of anxiety, depression as well as bipolar disorder, oftentimes does not want to get out and would be laying in the bed or in the chair for 24 hours.  Per patient, she basically started feeling weak 3 months ago.  But since about 3 to 4 weeks ago, she has had very poor appetite and she is eating only little and has been drinking almost 3 sodas a day with no water but sometimes will drink some juice.  Per sister, she is barely eating 300 cal a day.  Per patient, she has lost approximately 20 to 25 pounds in about 2 to 3 months.  She denies any chest pain, abdominal pain, shortness of breath, nausea, vomiting, any problem with urination or with bowel movements.  Per sister, she has been out of her home only once in 3 months and that 2 was for doctor's visit.  Patient claims that she takes all her medications regularly including Synthroid.  She does carry a history of GERD.  Additionally, patient is complaining of severe pain and tingling and burning in bilateral feet which started about 2 months ago.    PT Comments    Pt in bed, agreeable to participation with therapy today.  Pt required only stand by assist with transferring to supine and reported feeling fine, just weak upon standing.  Pt ambulated approx 10 feet before stating she was feeling dizzy.  Noted change in posturing and weakness in LE's.  Pt assisted to edge of bed where she stated she was feeling like she did the other day when she thought she  was going to pass out.  Nursing notified and vitals assessed which were normal.  Pt positioned in bed with min assist and was left feeling better at end of session.     Recommendations for follow up therapy are one component of a multi-disciplinary discharge planning process, led by the attending physician.  Recommendations may be updated based on patient status, additional functional criteria and insurance authorization.        Patient can return home with the following A lot of help with walking and/or transfers;A little help with bathing/dressing/bathroom;Help with stairs or ramp for entrance;Assistance with cooking/housework   Equipment Recommendations  None recommended by PT       Precautions / Restrictions Precautions Precautions: Fall Restrictions Weight Bearing Restrictions: No     Mobility  Bed Mobility Overal bed mobility: Modified Independent Bed Mobility: Supine to Sit     Supine to sit: Supervision     General bed mobility comments: increased time, labored movement    Transfers Overall transfer level: Needs assistance Equipment used: Rolling walker (2 wheels) Transfers: Sit to/from Stand Sit to Stand: Min assist, Min guard           General transfer comment: unsteady labord movement with c/o dizziness    Ambulation/Gait Ambulation/Gait assistance: Min assist, Mod assist Gait Distance (Feet): 15 Feet Assistive device: Rolling walker (2 wheels) Gait Pattern/deviations: Decreased step length - right, Decreased step length - left, Decreased stride length  General Gait Details: Pt c/o dizzines on the way back from ambulation and was transferred to bed, returned to supine positioning.  Nursing took vitals which were normal.        Cognition Arousal/Alertness: Awake/alert Behavior During Therapy: WFL for tasks assessed/performed Overall Cognitive Status: Within Functional Limits for tasks assessed                                                  Pertinent Vitals/Pain Pain Assessment Pain Score: 0-No pain           PT Goals (current goals can now be found in the care plan section)      Frequency    Min 3X/week         Co-evaluation PT/OT/SLP Co-Evaluation/Treatment: Yes Reason for Co-Treatment: To address functional/ADL transfers PT goals addressed during session: Mobility/safety with mobility;Balance;Proper use of DME        AM-PAC PT "6 Clicks" Mobility   Outcome Measure  Help needed turning from your back to your side while in a flat bed without using bedrails?: None Help needed moving from lying on your back to sitting on the side of a flat bed without using bedrails?: A Little Help needed moving to and from a bed to a chair (including a wheelchair)?: A Little Help needed standing up from a chair using your arms (e.g., wheelchair or bedside chair)?: A Little Help needed to walk in hospital room?: A Lot Help needed climbing 3-5 steps with a railing? : A Lot 6 Click Score: 17    End of Session Equipment Utilized During Treatment: Gait belt Activity Tolerance: Patient limited by fatigue;Treatment limited secondary to medical complications (Comment);Patient limited by lethargy Patient left: in bed;with call bell/phone within reach Nurse Communication: Mobility status;Other (comment) (take vitals) PT Visit Diagnosis: Unsteadiness on feet (R26.81);Other abnormalities of gait and mobility (R26.89);Muscle weakness (generalized) (M62.81)     Time: 7680-8811 PT Time Calculation (min) (ACUTE ONLY): 24 min  Charges:  $Gait Training: 8-22 mins $Therapeutic Activity: 8-22 mins                     Lurena Nida, PTA/CLT [facility] Ph: [phone]   Lurena Nida 03/05/2023, 10:29 AM

## 2023-03-05 NOTE — Progress Notes (Signed)
Occupational Therapy Treatment Patient Details Name: Sheryl Hall MRN: [id number] DOB: 1961-09-06 Today's Date: 03/05/2023   History of present illness Sheryl Hall is a 62 y.o. female with medical history significant of hypertension, hyperlipidemia, GERD, hypothyroidism and many other presented to ED with several complaints.  Basically she started having weakness about 3 months ago.  Her sister is at the bedside who is providing additional information as well.  According to her, patient having history of anxiety, depression as well as bipolar disorder, oftentimes does not want to get out and would be laying in the bed or in the chair for 24 hours.  Per patient, she basically started feeling weak 3 months ago.  But since about 3 to 4 weeks ago, she has had very poor appetite and she is eating only little and has been drinking almost 3 sodas a day with no water but sometimes will drink some juice.  Per sister, she is barely eating 300 cal a day.  Per patient, she has lost approximately 20 to 25 pounds in about 2 to 3 months.  She denies any chest pain, abdominal pain, shortness of breath, nausea, vomiting, any problem with urination or with bowel movements.  Per sister, she has been out of her home only once in 3 months and that 2 was for doctor's visit.  Patient claims that she takes all her medications regularly including Synthroid.  She does carry a history of GERD.  Additionally, patient is complaining of severe pain and tingling and burning in bilateral feet which started about 2 months ago.   OT comments  Pt agreeable to OT treatment. Pt remained supine in bed to work on B UE strengthening due to difficulty if sitting upright against gravity. Pt noted to have better strength in L UE with abilty to complete A/ROM for abduction, horizontal abduction, and protraction. A/AROM needed for R UE. Pt taught how to clasp hands to do AA/ROM in bed. Several rest breaks needed between sets. Pt left in bed with  call bell within reach and bed alarm set. Pt will benefit from continued OT in the hospital and recommended venue below to increase strength, balance, and endurance for safe ADL's.       Recommendations for follow up therapy are one component of a multi-disciplinary discharge planning process, led by the attending physician.  Recommendations may be updated based on patient status, additional functional criteria and insurance authorization.    Assistance Recommended at Discharge Intermittent Supervision/Assistance  Patient can return home with the following  A lot of help with walking and/or transfers;A lot of help with bathing/dressing/bathroom;Assistance with cooking/housework;Assist for transportation;Help with stairs or ramp for entrance   Equipment Recommendations  None recommended by OT    Recommendations for Other Services      Precautions / Restrictions Precautions Precautions: Fall Restrictions Weight Bearing Restrictions: No                                                                                     Cognition Arousal/Alertness: Awake/alert Behavior During Therapy: WFL for tasks assessed/performed Overall Cognitive Status: Within Functional Limits for tasks assessed  Exercises Exercises: General Upper Extremity General Exercises - Upper Extremity Shoulder Flexion: AAROM, 20 reps, Supine, Both Shoulder ABduction: AAROM, 10 reps, Right, Supine (Some A/ROM with L UE) Shoulder Horizontal ABduction: 10 reps, AAROM, Right, Supine (L UE more A/ROM; 2x10 reps of AA/ROM.)                 Pertinent Vitals/ Pain       Pain Assessment Pain Assessment: 0-10 Pain Score: 8  Pain Location: feet Pain Descriptors / Indicators: Pins and needles Pain Intervention(s): Limited activity within patient's tolerance, Monitored during session                                                           Frequency  Min 2X/week        Progress Toward Goals  OT Goals(current goals can now be found in the care plan section)  Progress towards OT goals: Progressing toward goals  Acute Rehab OT Goals Patient Stated Goal: Open to rehab to improve function. OT Goal Formulation: With patient Time For Goal Achievement: 03/17/23 Potential to Achieve Goals: Good ADL Goals Pt Will Perform Grooming: with modified independence;sitting Pt Will Perform Upper Body Bathing: with modified independence;sitting Pt Will Perform Upper Body Dressing: with modified independence;sitting Pt Will Perform Lower Body Dressing: with modified independence;sitting/lateral leans Pt Will Transfer to Toilet: with modified independence;stand pivot transfer Pt Will Perform Toileting - Clothing Manipulation and hygiene: with modified independence;sitting/lateral leans Pt/caregiver will Perform Home Exercise Program: Increased ROM;Increased strength;Both right and left upper extremity;Independently  Plan Discharge plan remains appropriate    Co-evaluation      Reason for Co-Treatment: To address functional/ADL transfers PT goals addressed during session: Mobility/safety with mobility;Balance;Proper use of DME                           End of Session Equipment Utilized During Treatment: Rolling walker (2 wheels)  OT Visit Diagnosis: Unsteadiness on feet (R26.81);Other abnormalities of gait and mobility (R26.89);Muscle weakness (generalized) (M62.81);Dizziness and giddiness (R42)   Activity Tolerance Patient tolerated treatment well   Patient Left in bed;with call bell/phone within reach;with bed alarm set   Nurse Communication          Time: [phone] OT Time Calculation (min): 19 min  Charges: OT General Charges $OT Visit: 1 Visit OT Treatments $Therapeutic Exercise: 8-22 mins  Neill Jurewicz OT, MOT  Danie Chandler 03/05/2023,  9:55 AM

## 2023-03-05 NOTE — TOC Progression Note (Signed)
Transition of Care [facility]) - Progression Note    Patient Details  Name: LYLLIE LOGRASSO MRN: [id number] Date of Birth: [birth year]  [facility]) CM/SW Contact  Elliot Gault, LCSW Phone Number: 03/05/2023, 10:21 AM  Clinical Narrative:     TOC following. Spoke with pt/sister again today regarding dc planning. They report that they will accept bed at [facility].   Updated Whitney at [facility] and she states they will start [place]. They are hoping to have it by tomorrow.  TOC will follow.  Expected Discharge Plan: Skilled Nursing Facility Barriers to Discharge: Continued Medical Work up  Expected Discharge Plan and Services In-house Referral: Clinical Social Work Discharge Planning Services: CM Consult Post Acute Care Choice: Skilled Nursing Facility Living arrangements for the past 2 months: Single Family Home                                       Social Determinants of Health (SDOH) Interventions SDOH Screenings   Food Insecurity: No Food Insecurity (03/03/2023)  Housing: Low Risk  (03/03/2023)  Transportation Needs: No Transportation Needs (03/03/2023)  Utilities: Not At Risk (03/03/2023)  Alcohol Screen: Low Risk  (07/18/2021)  Depression (PHQ2-9): High Risk (05/05/2022)  Tobacco Use: Medium Risk (03/02/2023)    Readmission Risk Interventions     No data to display

## 2023-03-05 NOTE — Progress Notes (Signed)
New IV obtained by [facility] RN using Ultrasound. Patient refusing with CNA to attempt to use bedside commode. This nurse informed patient that we can assist her up and would ensure safety. Patient requested something for sleep, but later was sleeping so melatonin was not given. She later complained of pain in leg, Tylenol given. Continued to monitor patient who slept the rest of the shift.

## 2023-03-05 NOTE — Progress Notes (Signed)
PROGRESS NOTE  Sheryl Hall  EHU:[id number] DOB: 1961-02-16 DOA: 03/02/2023 PCP: Donita Anchondo, MD   Brief Narrative: Patient is a 62 year old female with history of hypertension, hyperlipidemia, GERD, hypothyroidism who presented with generalized weakness, dizziness, poor appetite.  Started feeling weak 3 months ago.  Has very low oral intake since last 3 to 4 weeks.  History of weight loss of 20 to 25 pounds in last 2 to 3 months.  Also complains of severe pain and tingling and burning on bilateral lower extremity.  On presentation, she was hemodynamically stable.  Lab work showed potassium of 2.6, low magnesium.  Patient admitted for further workup.  PT OT consulted, recommended SNF.  Vitamin B12 found to be low,started supplementation.  Possibly has a bed in skilled nursing facility tomorrow  Assessment & Plan:  Principal Problem:   Generalized weakness Active Problems:   Anxiety   Depression   Bipolar disorder (HCC)   Hyperthyroidism   Hyperlipidemia   Hypokalemia   Hypomagnesemia   Weakness generalized   Generalized weakness/failure to thrive: Not eating, drinking since last several weeks.  Complain of generalized weakness, weight loss.  Found to be severely deficient in vitamin B12.   TSH normal. PT/OT, dietitian consulted. PT recommending SNF on discharge.  TOC following. Her depression cud be also contributing to this. She is not suicidal. Checked ESR, CRP to rule out polymyalgia rheumatica, found to be normal.  She complains of generalized weakness and pain everywhere.  Vitamin B12 deficiency: Vitamin B12 138.  Continue daily IM supplementation until discharge with oral supplement.    Depression: Complains of severe depression but denies any suicidal ideations or thoughts.  Psychiatry consulted, recommended to start on mirtazapine.  Hypomagnesemia/hypokalemia: Supplemented and corrected  Dehydration: Secondary to poor oral intake.  Started on IV fluids.  Renal  function intact.  Syncopal episode: Happened in the morning of 4/9.  Blood pressure stable.  Complain of some dizziness this morning while ambulating.  Continue gentle IV fluids for today  Tingling/burning of bilateral lower extremity: This is secondary to vitamin B12 deficiency causing neuropathy.  Continue supplementation. Started on low-dose gabapentin  Hypothyroidism: Continue Synthyroid.  TSH normal.  History of bipolar disorder/anxiety/depression: On Lexapro, olanzapine, trazodone from home.  We recommend to follow-up with psychiatry as an outpatient.  Currently holding olanzapine, trazodone for now.  She has an appointment with psychiatry as an outpatient.  GERD: Continue PPI  History of seizure disorder: Takes Keppra.  Denies any recent history of seizures.  No evidence of seizure as per EEG.  Hyperlipidemia: Continue statin  Colitis:CT abd/pelvis showed mild diffuse colitis.Unclear etiology.No abd pain,fever or leucocytosis .pending GI pathogen panel, C. difficile.  Complain of diarrhea so ordered Imodium.  Diarrhea slowing down. She needs to have colonoscopy as outpatient in 6-8 weeks.  Will discontinue antibiotic as soon as GI pathogen panel, C. difficile results  Substance abuse: Patient denies any substance abuse.  But UDS showing cocaine and THC.  Counseled for cessation  Nutrition Problem: Inadequate oral intake Etiology: decreased appetite, acute illness    DVT prophylaxis:enoxaparin (LOVENOX) injection 40 mg Start: 03/02/23 2200     Code Status: Full Code  Family Communication: Called and discussed with sister on phone on 4/9  Patient status: Inpatient  Patient is from : Home  Anticipated discharge to: SNF  Estimated DC date: Tomorrow   Consultants: None  Procedures: None  Antimicrobials:  Anti-infectives (From admission, onward)    Start     Dose/Rate Route Frequency  Ordered Stop   03/03/23 1645  cefTRIAXone (ROCEPHIN) 2 g in sodium chloride 0.9 %  100 mL IVPB        2 g 200 mL/hr over 30 Minutes Intravenous Every 24 hours 03/03/23 1553     03/03/23 1645  metroNIDAZOLE (FLAGYL) IVPB 500 mg        500 mg 100 mL/hr over 60 Minutes Intravenous Every 12 hours 03/03/23 1553         Subjective: Patient seen and examined at bedside today.  Hemodynamically stable.  Lying in bed.  She said when she was ambulating to go to bathroom, she felt dizzy.  He still complains of tingling and numbness on bilateral feet.  Complains of weakness.  Diarrhea has slowed down.  Last bowel movement yesterday.  No nausea or vomiting..  Objective: Vitals:   03/04/23 1352 03/04/23 2147 03/05/23 0419 03/05/23 0500  BP: (!) 120/45 120/65 127/63   Pulse: 89 86 81   Resp: 20 18 19    Temp: 98.4 F (36.9 C) (!) 96.6 F (35.9 C) (!) 97.5 F (36.4 C)   TempSrc: Oral     SpO2: 100% 99% 96%   Weight:    76.4 kg  Height:        Intake/Output Summary (Last 24 hours) at 03/05/2023 1106 Last data filed at 03/05/2023 0932 Gross per 24 hour  Intake 1394.69 ml  Output --  Net 1394.69 ml   Filed Weights   03/03/23 0824 03/04/23 0531 03/05/23 0500  Weight: 72.7 kg 74.5 kg 76.4 kg    Examination:  General exam: Overall comfortable, not in distress,weak appearing HEENT: PERRL Respiratory system:  no wheezes or crackles  Cardiovascular system: S1 & S2 heard, RRR.  Gastrointestinal system: Abdomen is nondistended, soft and nontender. Central nervous system: Alert and oriented Extremities: No edema, no clubbing ,no cyanosis Skin: No rashes, no ulcers,no icterus     Data Reviewed: I have personally reviewed following labs and imaging studies  CBC: Recent Labs  Lab 03/02/23 1923 03/03/23 0432  WBC 8.2 6.0  HGB 13.4 12.8  HCT 41.2 40.7  MCV 87.5 89.5  PLT 221 197   Basic Metabolic Panel: Recent Labs  Lab 03/02/23 1923 03/02/23 1949 03/03/23 0432 03/03/23 0445 03/04/23 0422  NA 142  --  140  --  140  K 2.6*  --  3.7  --  4.5  CL 100  --  105   --  111  CO2 29  --  27  --  24  GLUCOSE 96  --  86  --  89  BUN 12  --  12  --  10  CREATININE 0.78  --  0.75  --  0.67  CALCIUM 8.3*  --  7.8*  --  8.0*  MG  --  1.5* 1.9  --  1.9  PHOS  --   --   --  2.7  --      No results found for this or any previous visit (from the past 240 hour(s)).   Radiology Studies: EEG adult  Result Date: 03/04/2023 Charlsie QuestYadav, Priyanka O, MD     03/04/2023  8:49 AM Patient Name: Linward Fostermanda W Couey MRN: [id number] Epilepsy Attending: Charlsie QuestPriyanka O Yadav Referring Physician/Provider: Burnadette PopAdhikari, Silvie Obremski, MD Date: 03/03/2023 Duration: 22.07 mins Patient history: 62yo F with syncope. EEG to evaluate for seizure Level of alertness: Awake AEDs during EEG study: LEV Technical aspects: This EEG study was done with scalp electrodes positioned according to the 10-20 International  system of electrode placement. Electrical activity was reviewed with band pass filter of 1-70Hz , sensitivity of 7 uV/mm, display speed of 7mm/sec with a 60Hz  notched filter applied as appropriate. EEG data were recorded continuously and digitally stored.  Video monitoring was available and reviewed as appropriate. Description: The posterior dominant rhythm consists of 9 Hz activity of moderate voltage (25-35 uV) seen predominantly in posterior head regions, symmetric and reactive to eye opening and eye closing. Hyperventilation and photic stimulation were not performed.   IMPRESSION: This study is within normal limits. No seizures or epileptiform discharges were seen throughout the recording. A normal interictal EEG does not exclude the diagnosis of epilepsy. Priyanka Annabelle Harman   CT ABDOMEN PELVIS WO CONTRAST  Result Date: 03/03/2023 CLINICAL DATA:  Weakness x3 months, evaluate for malignancy EXAM: CT ABDOMEN AND PELVIS WITHOUT CONTRAST TECHNIQUE: Multidetector CT imaging of the abdomen and pelvis was performed following the standard protocol without IV contrast. RADIATION DOSE REDUCTION: This exam was performed  according to the departmental dose-optimization program which includes automated exposure control, adjustment of the mA and/or kV according to patient size and/or use of iterative reconstruction technique. COMPARISON:  CT done on 11/07/2013, abdomen sonogram done on 04/16/2016 FINDINGS: Lower chest: No acute findings are seen. Hepatobiliary: No focal abnormalities are seen in liver. There is no dilation of bile ducts. There is no wall thickening in gallbladder. Pancreas: No focal abnormalities are seen. Spleen: Unremarkable. Adrenals/Urinary Tract: Adrenals are unremarkable. There is no hydronephrosis. There are no renal or ureteral stones. Evaluation of urinary bladder is limited by severe beam hardening artifacts. Bladder is not distended. Stomach/Bowel: Stomach is unremarkable. Small bowel loops are not dilated. Appendix is not dilated. There is mild diffuse wall thickening in right:. There is decreased density in submucosal region in right colon. There is no significant focal wall thickening. Scattered diverticula are seen in: Without focal acute diverticulitis. There is incomplete distention of sigmoid colon. There is no pericolic stranding or fluid collection. Vascular/Lymphatic: Scattered arterial calcifications are seen. No significant lymphadenopathy is seen. Reproductive: Evaluation is limited due to severe beam hardening artifacts. Other: There is no ascites or pneumoperitoneum. Small umbilical hernia containing fat is seen. There is mild subcutaneous edema without any loculated fluid collections. Musculoskeletal: There is previous arthroplasty in both hips. No acute findings are seen. IMPRESSION: There is no evidence of intestinal obstruction or pneumoperitoneum. Appendix is not dilated. There is no hydronephrosis. Scattered diverticula seen in colon without signs of focal acute diverticulitis. There is mild diffuse wall thickening in ascending and proximal transverse colon. There is low-density in  submucosal location in right colon. These findings may be due to incomplete distention or suggest chronic nonspecific colitis. There is no loculated pericolic fluid collection. There is mild diffuse wall thickening in sigmoid colon which may be due to incomplete distention or due to chronic diverticulitis. If there are continued symptoms, follow-up colonoscopy may be considered to evaluate for any underlying neoplastic process. Electronically Signed   By: Ernie Avena M.D.   On: 03/03/2023 15:10    Scheduled Meds:  atorvastatin  40 mg Oral Daily   enoxaparin (LOVENOX) injection  40 mg Subcutaneous Q24H   escitalopram  20 mg Oral Daily   feeding supplement  237 mL Oral BID BM   gabapentin  200 mg Oral TID   levETIRAcetam  1,000 mg Oral BID   levothyroxine  50 mcg Oral Daily   melatonin  6 mg Oral QHS   mirtazapine  7.5 mg Oral  QHS   multivitamin-lutein  1 capsule Oral Daily   pantoprazole  40 mg Oral Daily   Continuous Infusions:  sodium chloride Stopped (03/05/23 0817)   cefTRIAXone (ROCEPHIN)  IV 2 g (03/04/23 1835)   metronidazole 500 mg (03/04/23 2258)     LOS: 2 days   Burnadette Pop, MD [facility] P4/09/2023, 11:06 AM

## 2023-03-05 NOTE — Patient Instructions (Signed)

## 2023-03-06 DIAGNOSIS — S42309S Unspecified fracture of shaft of humerus, unspecified arm, sequela: Secondary | ICD-10-CM | POA: Insufficient documentation

## 2023-03-06 DIAGNOSIS — R531 Weakness: Secondary | ICD-10-CM | POA: Diagnosis not present

## 2023-03-06 DIAGNOSIS — Z4732 Aftercare following explantation of hip joint prosthesis: Secondary | ICD-10-CM | POA: Insufficient documentation

## 2023-03-06 DIAGNOSIS — M519 Unspecified thoracic, thoracolumbar and lumbosacral intervertebral disc disorder: Secondary | ICD-10-CM | POA: Insufficient documentation

## 2023-03-06 LAB — C DIFFICILE QUICK SCREEN W PCR REFLEX
C Diff antigen: NEGATIVE
C Diff interpretation: NOT DETECTED
C Diff toxin: NEGATIVE

## 2023-03-06 LAB — VITAMIN B1: Vitamin B1 (Thiamine): 73.2 nmol/L (ref 66.5–200.0)

## 2023-03-06 MED ORDER — CYANOCOBALAMIN 1000 MCG PO TABS: 1000.0000 ug | ORAL_TABLET | Freq: Every day | ORAL | Status: DC

## 2023-03-06 MED ORDER — MELATONIN 3 MG PO TABS: 6.0000 mg | ORAL_TABLET | Freq: Every day | ORAL | 0 refills | Status: DC

## 2023-03-06 MED ORDER — ENSURE ENLIVE PO LIQD: 237.0000 mL | Freq: Two times a day (BID) | ORAL | 12 refills | Status: DC

## 2023-03-06 MED ORDER — GABAPENTIN 100 MG PO CAPS: 200.0000 mg | ORAL_CAPSULE | Freq: Three times a day (TID) | ORAL | Status: DC

## 2023-03-06 MED ORDER — MIRTAZAPINE 7.5 MG PO TABS: 7.5000 mg | ORAL_TABLET | Freq: Every day | ORAL | Status: DC

## 2023-03-06 MED ORDER — LOPERAMIDE HCL 2 MG PO CAPS: 2.0000 mg | ORAL_CAPSULE | Freq: Four times a day (QID) | ORAL | 0 refills | Status: DC | PRN

## 2023-03-06 MED ORDER — AMOXICILLIN-POT CLAVULANATE 875-125 MG PO TABS
1.0000 | ORAL_TABLET | Freq: Two times a day (BID) | ORAL | Status: DC
  Administered 2023-03-06: 1 via ORAL
  Filled 2023-03-06: qty 1

## 2023-03-06 MED ORDER — AMOXICILLIN-POT CLAVULANATE 875-125 MG PO TABS: 1.0000 | ORAL_TABLET | Freq: Two times a day (BID) | ORAL | 0 refills | Status: AC

## 2023-03-06 NOTE — Plan of Care (Signed)
  Problem: Education: Goal: Knowledge of General Education information will improve Description: Including pain rating scale, medication(s)/side effects and non-pharmacologic comfort measures Outcome: Adequate for Discharge   Problem: Health Behavior/Discharge Planning: Goal: Ability to manage health-related needs will improve Outcome: Adequate for Discharge   Problem: Clinical Measurements: Goal: Ability to maintain clinical measurements within normal limits will improve Outcome: Adequate for Discharge Goal: Will remain free from infection Outcome: Adequate for Discharge Goal: Diagnostic test results will improve Outcome: Adequate for Discharge Goal: Respiratory complications will improve Outcome: Adequate for Discharge Goal: Cardiovascular complication will be avoided Outcome: Adequate for Discharge   Problem: Activity: Goal: Risk for activity intolerance will decrease Outcome: Adequate for Discharge   Problem: Nutrition: Goal: Adequate nutrition will be maintained Outcome: Adequate for Discharge   Problem: Coping: Goal: Level of anxiety will decrease Outcome: Adequate for Discharge   Problem: Elimination: Goal: Will not experience complications related to bowel motility Outcome: Adequate for Discharge Goal: Will not experience complications related to urinary retention Outcome: Adequate for Discharge   Problem: Pain Managment: Goal: General experience of comfort will improve Outcome: Adequate for Discharge   Problem: Safety: Goal: Ability to remain free from injury will improve Outcome: Adequate for Discharge   Problem: Skin Integrity: Goal: Risk for impaired skin integrity will decrease Outcome: Adequate for Discharge   Problem: Acute Rehab OT Goals (only OT should resolve) Goal: Pt. Will Perform Grooming Outcome: Adequate for Discharge Goal: Pt. Will Perform Upper Body Bathing Outcome: Adequate for Discharge Goal: Pt. Will Perform Upper Body  Dressing Outcome: Adequate for Discharge Goal: Pt. Will Perform Lower Body Dressing Outcome: Adequate for Discharge Goal: Pt. Will Transfer To Toilet Outcome: Adequate for Discharge Goal: Pt. Will Perform Toileting-Clothing Manipulation Outcome: Adequate for Discharge Goal: Pt/Caregiver Will Perform Home Exercise Program Outcome: Adequate for Discharge   Problem: Acute Rehab PT Goals(only PT should resolve) Goal: Pt Will Go Supine/Side To Sit Outcome: Adequate for Discharge Goal: Patient Will Transfer Sit To/From Stand Outcome: Adequate for Discharge Goal: Pt Will Transfer Bed To Chair/Chair To Bed Outcome: Adequate for Discharge Goal: Pt Will Ambulate Outcome: Adequate for Discharge

## 2023-03-06 NOTE — Progress Notes (Signed)
Report called to Candace at [facility].

## 2023-03-06 NOTE — TOC Transition Note (Signed)
Transition of Care [facility]) - CM/SW Discharge Note   Patient Details  Name: Sheryl Hall MRN: [id number] Date of Birth: [birth year]  Transition of Care [facility]) CM/SW Contact:  Elliot Gault, LCSW Phone Number: 03/06/2023, 12:04 PM   Clinical Narrative:     Pt stable for dc to SNF today per MD. Spoke with pt/sister about bed offer from [facility]. They are accepting this bed offer.   Zigmund Gottron at the SNF. They can take pt today. DC clinical sent electronically. RN to call report. EMS arranged.  There are no other TOC needs for dc.  Final next level of care: Skilled Nursing Facility Barriers to Discharge: Barriers Resolved   Patient Goals and CMS Choice CMS Medicare.gov Compare Post Acute Care list provided to:: Patient Choice offered to / list presented to : Patient  Discharge Placement                Patient chooses bed at: [facility] Patient to be transferred to facility by: EMS Name of family member notified: Stefano Gaul Patient and family notified of of transfer: 03/06/23  Discharge Plan and Services Additional resources added to the After Visit Summary for   In-house Referral: Clinical Social Work Discharge Planning Services: CM Consult Post Acute Care Choice: Skilled Nursing Facility                               Social Determinants of Health (SDOH) Interventions SDOH Screenings   Food Insecurity: No Food Insecurity (03/03/2023)  Housing: Low Risk  (03/03/2023)  Transportation Needs: No Transportation Needs (03/03/2023)  Utilities: Not At Risk (03/03/2023)  Alcohol Screen: Low Risk  (07/18/2021)  Depression (PHQ2-9): High Risk (05/05/2022)  Tobacco Use: Medium Risk (03/02/2023)     Readmission Risk Interventions     No data to display

## 2023-03-06 NOTE — Progress Notes (Signed)
Patient took medications, received tylenol for pain in legs. She slept the rest of the night only waking for vitals and medication. Continued to monitor.

## 2023-03-06 NOTE — Discharge Summary (Signed)
Physician Discharge Summary  Sheryl Hall:[id number] DOB: 01/11/1961 DOA: 03/02/2023  PCP: Donita Wilsey, MD  Admit date: 03/02/2023 Discharge date: 03/06/2023  Admitted From: Home Disposition:  Home  Discharge Condition:Stable CODE STATUS:FULL Diet recommendation: Regular   Brief/Interim Summary: Patient is a 62 year old female with history of hypertension, hyperlipidemia, GERD, hypothyroidism who presented with generalized weakness, dizziness, poor appetite.  Started feeling weak 3 months ago.  Has very low oral intake since last 3 to 4 weeks.  History of weight loss of 20 to 25 pounds in last 2 to 3 months.  Also complains of severe pain and tingling and burning on bilateral lower extremity.  On presentation, she was hemodynamically stable.  Lab work showed potassium of 2.6, low magnesium.  Patient admitted for further workup.  PT OT consulted, recommended SNF.  Vitamin B12 found to be low,started supplementation.  Medically stable for discharge to SNF today.  We recommend follow-up with neurology as an outpatient.  Following problems were addressed during the hospitalization:  Generalized weakness/failure to thrive: Not eating, drinking since last several weeks.  Complain of generalized weakness, weight loss.  Found to be severely deficient in vitamin B12.   TSH normal. PT/OT, dietitian consulted. PT recommending SNF on discharge.  TOC following. Her depression cud be also contributing to this. She is not suicidal. Checked ESR, CRP to rule out polymyalgia rheumatica, found to be normal.  She complains of generalized weakness and pain everywhere.   Vitamin B12 deficiency: Vitamin B12 138.  Given daily IM supplementation ,now on oral supplement.     Depression: Complains of severe depression but denies any suicidal ideations or thoughts.  Psychiatry consulted, recommended to start on mirtazapine.   Hypomagnesemia/hypokalemia: Supplemented and corrected   Dehydration: Secondary  to poor oral intake.Given  IV fluids.  Renal function intact.   Syncopal episode: Happened in the morning of 4/9.  Blood pressure stable.   Tingling/burning of bilateral lower extremity: This might  secondary to vitamin B12 deficiency causing neuropathy.  Continue supplementation. Started on low-dose gabapentin.  We recommend follow-up with neurology as an outpatient for possible EMG/nerve conduction test as an outpatient.  Referral provided.   Hypothyroidism: Continue Synthyroid.  TSH normal.   History of bipolar disorder/anxiety/depression: On Lexapro, olanzapine, trazodone from home.  We recommend to follow-up with psychiatry as an outpatient.  Currently holding olanzapine, trazodone for now.  She has an appointment with psychiatry as an outpatient.  Started on mirtazapine   GERD: Continue PPI   History of seizure disorder: Takes Keppra.  Denies any recent history of seizures.  No evidence of seizure as per EEG.   Hyperlipidemia: Continue statin   Colitis:CT abd/pelvis showed mild diffuse colitis.Unclear etiology.No abd pain,fever or leucocytosis .pending GI pathogen panel, C. difficile.  Complain of diarrhea so ordered Imodium.  Diarrhea has slowing down. She will benefit by having a colonoscopy as outpatient in 6-8 weeks by following up with gastroenterology.  Discharge Diagnoses:  Principal Problem:   Generalized weakness Active Problems:   Anxiety   Depression   Bipolar disorder (HCC)   Hyperthyroidism   Hyperlipidemia   Hypokalemia   Hypomagnesemia   Weakness generalized    Discharge Instructions  Discharge Instructions     Ambulatory referral to Neurology   Complete by: As directed    An appointment is requested in approximately: 4 weeks   Diet general   Complete by: As directed    Discharge instructions   Complete by: As directed    1)Please  take prescribed medications as instructed 2)Follow up with neurology as an outpatient.  Name and number the provider  group has been attached 3)Make an appointment with gastroenterology in 6 to 8 weeks for screening colonoscopy.   Increase activity slowly   Complete by: As directed       Allergies as of 03/06/2023   No Known Allergies      Medication List     STOP taking these medications    OLANZapine 10 MG tablet Commonly known as: ZYPREXA   traZODone 100 MG tablet Commonly known as: DESYREL       TAKE these medications    amoxicillin-clavulanate 875-125 MG tablet Commonly known as: AUGMENTIN Take 1 tablet by mouth every 12 (twelve) hours for 4 days.   atorvastatin 40 MG tablet Commonly known as: LIPITOR TAKE (1) TABLET BY MOUTH ONCE DAILY. What changed: See the new instructions.   cyanocobalamin 1000 MCG tablet Take 1 tablet (1,000 mcg total) by mouth daily. Start taking on: March 07, 2023   escitalopram 20 MG tablet Commonly known as: Lexapro Take 1 tablet (20 mg total) by mouth daily.   feeding supplement Liqd Take 237 mLs by mouth 2 (two) times daily between meals.   gabapentin 100 MG capsule Commonly known as: NEURONTIN Take 2 capsules (200 mg total) by mouth 3 (three) times daily.   levETIRAcetam 1000 MG tablet Commonly known as: KEPPRA Take 0.5 tablet in morning and 1.5 tablet in evening. What changed:  how much to take how to take this when to take this additional instructions   levothyroxine 50 MCG tablet Commonly known as: SYNTHROID TAKE ONE TABLET BY MOUTH ONCE DAILY.   loperamide 2 MG capsule Commonly known as: IMODIUM Take 1 capsule (2 mg total) by mouth every 6 (six) hours as needed for diarrhea or loose stools.   melatonin 3 MG Tabs tablet Take 2 tablets (6 mg total) by mouth at bedtime.   mirtazapine 7.5 MG tablet Commonly known as: REMERON Take 1 tablet (7.5 mg total) by mouth at bedtime.   omeprazole 40 MG capsule Commonly known as: PRILOSEC TAKE (1) CAPSULE BY MOUTH TWICE DAILY. What changed: See the new instructions.   ONE-A-DAY  WOMENS 50+ PO Take 1 tablet by mouth daily.        Contact information for after-discharge care     Destination     [facility] .   Service: Skilled Nursing Contact information: [address] [place] [address] [phone]                    No Known Allergies  Consultations: None   Procedures/Studies: EEG adult  Result Date: 03/17/23 Charlsie Quest, MD     Mar 17, 2023  8:49 AM Patient Name: Sheryl Hall MRN: [id number] Epilepsy Attending: Charlsie Quest Referring Physician/Provider: Burnadette Pop, MD Date: 03/03/2023 Duration: 22.07 mins Patient history: 61yo F with syncope. EEG to evaluate for seizure Level of alertness: Awake AEDs during EEG study: LEV Technical aspects: This EEG study was done with scalp electrodes positioned according to the 10-20 International system of electrode placement. Electrical activity was reviewed with band pass filter of 1-70Hz , sensitivity of 7 uV/mm, display speed of 39mm/sec with a 60Hz  notched filter applied as appropriate. EEG data were recorded continuously and digitally stored.  Video monitoring was available and reviewed as appropriate. Description: The posterior dominant rhythm consists of 9 Hz activity of moderate voltage (25-35 uV) seen predominantly in posterior head regions,  symmetric and reactive to eye opening and eye closing. Hyperventilation and photic stimulation were not performed.   IMPRESSION: This study is within normal limits. No seizures or epileptiform discharges were seen throughout the recording. A normal interictal EEG does not exclude the diagnosis of epilepsy. Priyanka Annabelle Harman   CT ABDOMEN PELVIS WO CONTRAST  Result Date: 03/03/2023 CLINICAL DATA:  Weakness x3 months, evaluate for malignancy EXAM: CT ABDOMEN AND PELVIS WITHOUT CONTRAST TECHNIQUE: Multidetector CT imaging of the abdomen and pelvis was performed following the standard protocol without IV contrast.  RADIATION DOSE REDUCTION: This exam was performed according to the departmental dose-optimization program which includes automated exposure control, adjustment of the mA and/or kV according to patient size and/or use of iterative reconstruction technique. COMPARISON:  CT done on 11/07/2013, abdomen sonogram done on 04/16/2016 FINDINGS: Lower chest: No acute findings are seen. Hepatobiliary: No focal abnormalities are seen in liver. There is no dilation of bile ducts. There is no wall thickening in gallbladder. Pancreas: No focal abnormalities are seen. Spleen: Unremarkable. Adrenals/Urinary Tract: Adrenals are unremarkable. There is no hydronephrosis. There are no renal or ureteral stones. Evaluation of urinary bladder is limited by severe beam hardening artifacts. Bladder is not distended. Stomach/Bowel: Stomach is unremarkable. Small bowel loops are not dilated. Appendix is not dilated. There is mild diffuse wall thickening in right:. There is decreased density in submucosal region in right colon. There is no significant focal wall thickening. Scattered diverticula are seen in: Without focal acute diverticulitis. There is incomplete distention of sigmoid colon. There is no pericolic stranding or fluid collection. Vascular/Lymphatic: Scattered arterial calcifications are seen. No significant lymphadenopathy is seen. Reproductive: Evaluation is limited due to severe beam hardening artifacts. Other: There is no ascites or pneumoperitoneum. Small umbilical hernia containing fat is seen. There is mild subcutaneous edema without any loculated fluid collections. Musculoskeletal: There is previous arthroplasty in both hips. No acute findings are seen. IMPRESSION: There is no evidence of intestinal obstruction or pneumoperitoneum. Appendix is not dilated. There is no hydronephrosis. Scattered diverticula seen in colon without signs of focal acute diverticulitis. There is mild diffuse wall thickening in ascending and  proximal transverse colon. There is low-density in submucosal location in right colon. These findings may be due to incomplete distention or suggest chronic nonspecific colitis. There is no loculated pericolic fluid collection. There is mild diffuse wall thickening in sigmoid colon which may be due to incomplete distention or due to chronic diverticulitis. If there are continued symptoms, follow-up colonoscopy may be considered to evaluate for any underlying neoplastic process. Electronically Signed   By: Ernie Avena M.D.   On: 03/03/2023 15:10      Subjective: Patient seen and examined at bedside today.  Hemodynamically stable.  Comfortable.  Lies on bed.  Still complaining of weakness, tingling/numbness in the legs.  Long discussion held at the bedside along with the sister on phone about discharge planning  Discharge Exam: Vitals:   03/06/23 0430 03/06/23 0815  BP: 127/71 129/81  Pulse: 78 90  Resp: 18   Temp: (!) 97.4 F (36.3 C)   SpO2: 99% 97%   Vitals:   03/05/23 2255 03/06/23 0430 03/06/23 0500 03/06/23 0815  BP: 123/64 127/71  129/81  Pulse: 88 78  90  Resp: 18 18    Temp: (!) 97.2 F (36.2 C) (!) 97.4 F (36.3 C)    TempSrc:      SpO2: 96% 99%  97%  Weight:   76.4 kg   Height:  General: Pt is alert, awake, not in acute distress,weak appearing Cardiovascular: RRR, S1/S2 +, no rubs, no gallops Respiratory: CTA bilaterally, no wheezing, no rhonchi Abdominal: Soft, NT, ND, bowel sounds + Extremities: no edema, no cyanosis    The results of significant diagnostics from this hospitalization (including imaging, microbiology, ancillary and laboratory) are listed below for reference.     Microbiology: No results found for this or any previous visit (from the past 240 hour(s)).   Labs: BNP (last 3 results) No results for input(s): "BNP" in the last 8760 hours. Basic Metabolic Panel: Recent Labs  Lab 03/02/23 1923 03/02/23 1949 03/03/23 0432  03/03/23 0445 03/04/23 0422  NA 142  --  140  --  140  K 2.6*  --  3.7  --  4.5  CL 100  --  105  --  111  CO2 29  --  27  --  24  GLUCOSE 96  --  86  --  89  BUN 12  --  12  --  10  CREATININE 0.78  --  0.75  --  0.67  CALCIUM 8.3*  --  7.8*  --  8.0*  MG  --  1.5* 1.9  --  1.9  PHOS  --   --   --  2.7  --    Liver Function Tests: Recent Labs  Lab 03/02/23 1923  AST 105*  ALT 39  ALKPHOS 62  BILITOT 0.7  PROT 5.4*  ALBUMIN 2.8*   Recent Labs  Lab 03/02/23 1923  LIPASE 27   No results for input(s): "AMMONIA" in the last 168 hours. CBC: Recent Labs  Lab 03/02/23 1923 03/03/23 0432  WBC 8.2 6.0  HGB 13.4 12.8  HCT 41.2 40.7  MCV 87.5 89.5  PLT 221 197   Cardiac Enzymes: No results for input(s): "CKTOTAL", "CKMB", "CKMBINDEX", "TROPONINI" in the last 168 hours. BNP: Invalid input(s): "POCBNP" CBG: Recent Labs  Lab 03/02/23 1948 03/03/23 1035  GLUCAP 109* 72   D-Dimer No results for input(s): "DDIMER" in the last 72 hours. Hgb A1c No results for input(s): "HGBA1C" in the last 72 hours. Lipid Profile No results for input(s): "CHOL", "HDL", "LDLCALC", "TRIG", "CHOLHDL", "LDLDIRECT" in the last 72 hours. Thyroid function studies No results for input(s): "TSH", "T4TOTAL", "T3FREE", "THYROIDAB" in the last 72 hours.  Invalid input(s): "FREET3" Anemia work up No results for input(s): "VITAMINB12", "FOLATE", "FERRITIN", "TIBC", "IRON", "RETICCTPCT" in the last 72 hours. Urinalysis    Component Value Date/Time   COLORURINE AMBER (A) 03/02/2023 0455   APPEARANCEUR CLEAR 03/02/2023 0455   APPEARANCEUR Clear 10/27/2014 1036   LABSPEC 1.025 03/02/2023 0455   LABSPEC 1.010 10/27/2014 1036   PHURINE 5.0 03/02/2023 0455   GLUCOSEU NEGATIVE 03/02/2023 0455   GLUCOSEU Negative 10/27/2014 1036   GLUCOSEU NEG mg/dL 16/08/9603 5409   HGBUR NEGATIVE 03/02/2023 0455   BILIRUBINUR SMALL (A) 03/02/2023 0455   BILIRUBINUR negative 02/29/2020 1508   BILIRUBINUR  Negative 10/27/2014 1036   KETONESUR 5 (A) 03/02/2023 0455   PROTEINUR 30 (A) 03/02/2023 0455   UROBILINOGEN 0.2 02/29/2020 1508   UROBILINOGEN 1.0 05/17/2015 1334   NITRITE NEGATIVE 03/02/2023 0455   LEUKOCYTESUR SMALL (A) 03/02/2023 0455   LEUKOCYTESUR Negative 10/27/2014 1036   Sepsis Labs Recent Labs  Lab 03/02/23 1923 03/03/23 0432  WBC 8.2 6.0   Microbiology No results found for this or any previous visit (from the past 240 hour(s)).  Please note: You were cared for by a hospitalist during  your hospital stay. Once you are discharged, your primary care physician will handle any further medical issues. Please note that NO REFILLS for any discharge medications will be authorized once you are discharged, as it is imperative that you return to your primary care physician (or establish a relationship with a primary care physician if you do not have one) for your post hospital discharge needs so that they can reassess your need for medications and monitor your lab values.    Time coordinating discharge: 40 minutes  SIGNED:   Burnadette Pop, MD  [facility] 03/06/2023, 11:21 AM Pager [phone]  If 7PM-7AM, please contact night-coverage [email or web address] Password [email or web address]

## 2023-03-08 LAB — VITAMIN B6: Vitamin B6: 5 ug/L (ref 3.4–65.2)

## 2023-03-12 ENCOUNTER — Telehealth (HOSPITAL_COMMUNITY): Payer: Self-pay | Admitting: Psychiatry

## 2023-03-12 ENCOUNTER — Ambulatory Visit (HOSPITAL_COMMUNITY): Payer: No Typology Code available for payment source | Admitting: Psychiatry

## 2023-03-12 NOTE — Telephone Encounter (Signed)
Patient calling from [facility]. Let her know we are not allowed to conduct outpatient visits while she is in higher level of care. Appointment will be rescheduled for when she returns to outpatient setting.

## 2023-04-15 ENCOUNTER — Encounter: Payer: Self-pay | Admitting: Neurology

## 2023-05-17 NOTE — Progress Notes (Unsigned)
[facility]  PATIENT: Sheryl Hall DOB: [birth year]  REFERRING DOCTOR OR PCP: Lynnea Ferrier, MD SOURCE: Patient, notes from neurology, imaging and lab reports, additional study reports.  Imaging studies personally reviewed.    _________________________________   HISTORICAL  CHIEF COMPLAINT:  Chief Complaint  Patient presents with   New Patient (Initial Visit)    RM 10. Accompanied by driver, Coralee Rud. NX Willis and Sarah-l/s 2023/internal referral for generalized weakness. She c/o legs buckling, left leg is weakest. C/o neuropathy in feet and hands.    HISTORY OF PRESENT ILLNESS:  Sheryl Hall is a 62 year old woman with a history of stroke and seizures.  She is a 62 yo woman who has had gradual onset of weakness in her legs over the past 3 months.  Left side is much worse than right.   She is now unable to walk.   A couple months ago, she was using a walker.  She also noted mild slurred speech recently.  She notes no arm weakness.   She denies numbness or change in bladder.       She was admitted to [facility] April 2024 and was found to have low B12.  TSH, ESR and CRP were fine.  She reports having an MRI of the brain but I saw no record of any MRI or CT scan.  She has been in rehab for the past 7 weeks.  Seizures are doing well.   Currently on Keppra 1000 mg bid she was last seen in the office 06/11/2022 Margie Ege, NP)  History of stroke (2014) In February 2014, she presented to the emergency room with a transient confusional episode.  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  Carotid Doppler showed no significant stenosis.  She saw Dr. Anne Hahn as an outpatient.  He was concerned about seizure and ordered an EEG.  It was abnormal showing bihemispheric dysrhythmic theta activity.  She was started on levetiracetam.  Weight was 195 and now 150 pounds    Imaging:  09/28/2017 MRI shows encephalomalacia in the  right occipital temporal junction consistent with a remote infarction.  This can be seen going back on CT scans to 2014.  MR angiogram 04/06/2013 was normal  Carotid ultrasound 01/07/2013 showed no significant stenosis though there was retrograde flow of the right vertebral artery  EEG 03/24/2013 showed bihemispheric dysrhythmic theta activity. This study suggests a lowered seizure threshold, and suggests a bihemispheric focus. No electrographic seizures were recorded.   REVIEW OF SYSTEMS: Constitutional: No fevers, chills, sweats, or change in appetite Eyes: No visual changes, double vision, eye pain Ear, nose and throat: No hearing loss, ear pain, nasal congestion, sore throat Cardiovascular: No chest pain, palpitations Respiratory:  No shortness of breath at rest or with exertion.   No wheezes GastrointestinaI: No nausea, vomiting, diarrhea, abdominal pain, fecal incontinence Genitourinary:  No dysuria, urinary retention or frequency.  No nocturia. Musculoskeletal:  No neck pain, back pain Integumentary: No rash, pruritus, skin lesions Neurological: as above Psychiatric: No depression at this time.  No anxiety Endocrine: No palpitations, diaphoresis, change in appetite, change in weigh or increased thirst Hematologic/Lymphatic:  No anemia, purpura, petechiae. Allergic/Immunologic: No itchy/runny eyes, nasal congestion, recent allergic reactions, rashes  ALLERGIES: No Known Allergies  HOME MEDICATIONS:  Current Outpatient Medications:    amoxicillin (AMOXIL) 500 MG tablet, Take 500 mg by mouth 3 (three) times daily., Disp: , Rfl:    atorvastatin (LIPITOR) 40  MG tablet, TAKE (1) TABLET BY MOUTH ONCE DAILY. (Patient taking differently: Take 10 mg by mouth daily.), Disp: 30 tablet, Rfl: 0   cyanocobalamin 1000 MCG tablet, Take 1 tablet (1,000 mcg total) by mouth daily., Disp: , Rfl:    feeding supplement (ENSURE ENLIVE / ENSURE PLUS) LIQD, Take 237 mLs by mouth 2 (two) times daily between  meals., Disp: 237 mL, Rfl: 12   levETIRAcetam (KEPPRA) 1000 MG tablet, Take 0.5 tablet in morning and 1.5 tablet in evening. (Patient taking differently: Take 1,000 mg by mouth 2 (two) times daily.), Disp: 180 tablet, Rfl: 3   levothyroxine (SYNTHROID) 50 MCG tablet, TAKE ONE TABLET BY MOUTH ONCE DAILY., Disp: 90 tablet, Rfl: 0   lithium 300 MG tablet, Take 300 mg by mouth 3 (three) times daily., Disp: , Rfl:    loperamide (IMODIUM) 2 MG capsule, Take 1 capsule (2 mg total) by mouth every 6 (six) hours as needed for diarrhea or loose stools., Disp: 30 capsule, Rfl: 0   melatonin 3 MG TABS tablet, Take 2 tablets (6 mg total) by mouth at bedtime., Disp: , Rfl: 0   mirtazapine (REMERON) 7.5 MG tablet, Take 1 tablet (7.5 mg total) by mouth at bedtime. (Patient taking differently: Take 30 mg by mouth at bedtime.), Disp: , Rfl:    Multiple Vitamins-Minerals (ONE-A-DAY WOMENS 50+ PO), Take 1 tablet by mouth daily., Disp: , Rfl:    omeprazole (PRILOSEC) 40 MG capsule, TAKE (1) CAPSULE BY MOUTH TWICE DAILY. (Patient taking differently: Take 40 mg by mouth daily.), Disp: 60 capsule, Rfl: 0   pregabalin (LYRICA) 50 MG capsule, Take 50 mg by mouth 2 (two) times daily., Disp: , Rfl:    senna (SENOKOT) 8.6 MG tablet, Take 2 tablets by mouth daily., Disp: , Rfl:    UNABLE TO FIND, Med Name: BIOFREEZE EXTERNAL GEL 4%, Disp: , Rfl:   PAST MEDICAL HISTORY: Past Medical History:  Diagnosis Date   Anxiety    Arthritis    Avascular necrosis of hip (HCC)    left   Bipolar 1 disorder (HCC)    Chronic left hip pain    Chronic nausea    Depression    Diverticulosis of colon    Family hx of colon cancer    age 34-father   Folate deficiency    GERD (gastroesophageal reflux disease)    Headache    Heart murmur    Hx of abnormal Pap smear    Hyperlipidemia    Hyperthyroidism    Keratosis, actinic    Low back pain 03/01/2013   MRI with multiple levels of disc bulge   Panic attacks    Polysubstance abuse (HCC)     PTSD (post-traumatic stress disorder)    S/P colonoscopy 05/30/2010   LAX sphincter tone, anal papilla, left-sided diverticulosis, normal random biopsies,, 1 polyp-TA   S/P endoscopy 05/30/2010   Dr Rourk-> non--critical Schatzki's ring, s/p 34F dilation   Seizures (HCC)    Stroke (HCC) 2014   Right parietal, no deficits    Tubular adenoma of colon 05/30/2010   Next colonoscopy 05/2015   UTI (urinary tract infection)     PAST SURGICAL HISTORY: Past Surgical History:  Procedure Laterality Date   COLONOSCOPY     every 5 years   COLONOSCOPY  05/30/2010   RMR:lax anal sphincter tone,anal papilla,otherwise normal/left-sided diverticula   ESOPHAGOGASTRODUODENOSCOPY  05/30/2010   SWF:[id number]'G ring/small HH otherwise normal   EXTERNAL EAR SURGERY Left 12 years ago   skin graft  from behind ear put in ear canal   JOINT REPLACEMENT     NECK SURGERY  10 years ago   SKIN CANCER DESTRUCTION     TOTAL HIP ARTHROPLASTY Left 04/19/2014   Procedure: LEFT TOTAL HIP ARTHROPLASTY ANTERIOR APPROACH;  Surgeon: Loanne Drilling, MD;  Location: [facility];  Service: Orthopedics;  Laterality: Left;   TOTAL HIP ARTHROPLASTY Right 05/23/2015   Procedure: RIGHT TOTAL HIP ARTHROPLASTY ANTERIOR APPROACH;  Surgeon: Ollen Gross, MD;  Location: [facility];  Service: Orthopedics;  Laterality: Right;    FAMILY HISTORY: Family History  Problem Relation Age of Onset   Diabetes Mother    Hypertension Mother    Colon cancer Father 68       living    Hypertension Father    Hypertension Sister    Heart attack Brother    Seizures Maternal Aunt     SOCIAL HISTORY: Social History   Socioeconomic History   Marital status: Divorced    Spouse name: Not on file   Number of children: 1   Years of education: Not on file   Highest education level: Not on file  Occupational History   Occupation: disabled  Tobacco Use   Smoking status: Former    Packs/day: 0.50    Years: 30.00    Additional pack years: 0.00    Total  pack years: 15.00    Types: Cigarettes    Quit date: 04/24/2022    Years since quitting: 1.0   Smokeless tobacco: Never   Tobacco comments:    trying to quit  Vaping Use   Vaping Use: Never used  Substance and Sexual Activity   Alcohol use: No    Alcohol/week: 0.0 standard drinks of alcohol   Drug use: No    Comment: hx cocaine abuse   Sexual activity: Never    Birth control/protection: None  Other Topics Concern   Not on file  Social History Narrative   Patient lives at home alone and she is single.    Disabled.   Education college education.   Right handed.   Caffeine mountain dew four daily.    Social Determinants of Health   Financial Resource Strain: Not on file  Food Insecurity: No Food Insecurity (03/03/2023)   Hunger Vital Sign    Worried About Running Out of Food in the Last Year: Never true    Ran Out of Food in the Last Year: Never true  Transportation Needs: No Transportation Needs (03/03/2023)   PRAPARE - Administrator, Civil Service (Medical): No    Lack of Transportation (Non-Medical): No  Physical Activity: Not on file  Stress: Not on file  Social Connections: Not on file  Intimate Partner Violence: Not At Risk (03/03/2023)   Humiliation, Afraid, Rape, and Kick questionnaire    Fear of Current or Ex-Partner: No    Emotionally Abused: No    Physically Abused: No    Sexually Abused: No       PHYSICAL EXAM  Vitals:   05/19/23 1418  BP: 137/67  Pulse: 84  Height: 5\' 6"  (1.676 m)    Body mass index is 27.19 kg/m.   General: The patient is well-developed and well-nourished and in no acute distress  HEENT:  Head is /AT.  Sclera are anicteric.  Funduscopic exam shows normal optic discs and retinal vessels.  Neck: No carotid bruits are noted.  The neck is nontender.  Cardiovascular: The heart has a regular rate and rhythm with a  normal S1 and S2. There were no murmurs, gallops or rubs.    Skin: Extremities are without rash or   edema.  Musculoskeletal:  Back is nontender  Neurologic Exam  Mental status: The patient is alert and oriented x 3 at the time of the examination. The patient has apparent normal recent and remote memory, with an apparently normal attention span and concentration ability.   Speech is normal.  Cranial nerves: Extraocular movements are full. Pupils are equal, round, and reactive to light and accomodation.  Visual fields are full.  Facial symmetry is present. There is good facial sensation to soft touch bilaterally.Facial strength is normal.  Trapezius and sternocleidomastoid strength is normal. No dysarthria is noted.  The tongue is midline, and the patient has symmetric elevation of the soft palate. No obvious hearing deficits are noted.  Motor:  Muscle bulk is normal.   Tone is normal. Strength is  4+ / 5 in both legs  Sensory: She reports reduced sensation to touch and vibration ON THE LEFT RELATIVE TO THE RIGHT LEG.   Also reduced sensation to vibration in both feet  Coordination: Cerebellar testing reveals good finger-nose-finger and heel-to-shin bilaterally.  Gait and station: She needs support to stand up.  When she is up, she needs bilateral support to stay standing due to very poor balance.  Steps are very poor even with bilateral support.  Reflexes: Deep tendon reflexes are symmetric and normal bilaterally.   Plantar responses are flexor.    DIAGNOSTIC DATA (LABS, IMAGING, TESTING) - I reviewed patient records, labs, notes, testing and imaging myself where available.  Lab Results  Component Value Date   WBC 6.0 03/03/2023   HGB 12.8 03/03/2023   HCT 40.7 03/03/2023   MCV 89.5 03/03/2023   PLT 197 03/03/2023      Component Value Date/Time   NA 140 03/04/2023 0422   NA 142 09/07/2017 1459   NA 144 10/27/2014 0625   K 4.5 03/04/2023 0422   K 4.2 10/27/2014 0625   K 4.4 08/27/2011 1314   CL 111 03/04/2023 0422   CL 107 10/27/2014 0625   CL 102 08/27/2011 1314   CO2 24  03/04/2023 0422   CO2 29 10/27/2014 0625   CO2 25 08/27/2011 1314   GLUCOSE 89 03/04/2023 0422   GLUCOSE 94 10/27/2014 0625   BUN 10 03/04/2023 0422   BUN 7 09/07/2017 1459   BUN 10 10/27/2014 0625   CREATININE 0.67 03/04/2023 0422   CREATININE 0.85 01/02/2023 1432   CALCIUM 8.0 (L) 03/04/2023 0422   CALCIUM 8.9 10/27/2014 0625   CALCIUM 9.9 08/27/2011 1314   PROT 5.4 (L) 03/02/2023 1923   PROT 6.1 09/07/2017 1459   PROT 5.9 (L) 10/27/2014 0625   ALBUMIN 2.8 (L) 03/02/2023 1923   ALBUMIN 4.1 09/07/2017 1459   ALBUMIN 3.3 (L) 10/27/2014 0625   AST 105 (H) 03/02/2023 1923   AST 16 10/27/2014 0625   AST 11 08/27/2011 1314   ALT 39 03/02/2023 1923   ALT 14 10/27/2014 0625   ALKPHOS 62 03/02/2023 1923   ALKPHOS 119 (H) 10/27/2014 0625   ALKPHOS 91 08/27/2011 1314   BILITOT 0.7 03/02/2023 1923   BILITOT <0.2 09/07/2017 1459   BILITOT 0.4 10/27/2014 0625   BILITOT 0.5 08/27/2011 1314   GFRNONAA >60 03/04/2023 0422   GFRNONAA 58 (L) 05/14/2020 1528   GFRAA 68 05/14/2020 1528   Lab Results  Component Value Date   CHOL 144 07/18/2021   HDL 45 (L)  07/18/2021   LDLCALC 78 07/18/2021   TRIG 133 07/18/2021   CHOLHDL 3.2 07/18/2021   Lab Results  Component Value Date   HGBA1C 5.7 (H) 03/02/2023   Lab Results  Component Value Date   VITAMINB12 138 (L) 03/02/2023   Lab Results  Component Value Date   TSH 2.276 03/02/2023       ASSESSMENT AND PLAN  Partial epilepsy with impairment of consciousness (HCC)  History of ischemic stroke  Gait disorder  Polyneuropathy  In summary, Ms. Athens is a 62 year old woman with a history of a cryptogenic right-sided stroke and seizures who has had progressive weakness and numbness in her legs, left much worse than right, since earlier this year.  On exam, she has reduced sensation in both feet as well as in the left leg.  Strength was just minimally reduced.  Balance was poor.  She has known low B12 and is possible that this is a  manifestation of that.  If a second B12 level has not budged any to the upside then she will need to go on shots instead of oral supplementation.  Because of the asymmetry, I am most concerned about either a stroke or a myelopathy and we will check MRI of the brain and spine to determine if these processes are occurring.  Will also check some blood work for other causes of neuropathy.  She will return to see [place] in 4 months or sooner if there are new or worsening neurologic symptoms.  Thank you for asking me to see Ms. Kaelin.  Please let me know if I can be of further assistance with her or other patients in the future    Royetta Probus A. Epimenio Foot, MD, [facility] 05/19/2023, 2:42 PM Certified in Neurology, Clinical Neurophysiology, Sleep Medicine and Neuroimaging  [facility] [address], [address] [place], [place] [address] [phone]

## 2023-05-19 ENCOUNTER — Encounter: Payer: Self-pay | Admitting: Neurology

## 2023-05-19 ENCOUNTER — Ambulatory Visit (INDEPENDENT_AMBULATORY_CARE_PROVIDER_SITE_OTHER): Payer: Medicaid Other | Admitting: Neurology

## 2023-05-19 ENCOUNTER — Telehealth: Payer: Self-pay | Admitting: Neurology

## 2023-05-19 VITALS — BP 137/67 | HR 84 | Ht 66.0 in

## 2023-05-19 DIAGNOSIS — Z8673 Personal history of transient ischemic attack (TIA), and cerebral infarction without residual deficits: Secondary | ICD-10-CM | POA: Diagnosis not present

## 2023-05-19 DIAGNOSIS — G40209 Localization-related (focal) (partial) symptomatic epilepsy and epileptic syndromes with complex partial seizures, not intractable, without status epilepticus: Secondary | ICD-10-CM

## 2023-05-19 DIAGNOSIS — G629 Polyneuropathy, unspecified: Secondary | ICD-10-CM | POA: Insufficient documentation

## 2023-05-19 DIAGNOSIS — R269 Unspecified abnormalities of gait and mobility: Secondary | ICD-10-CM | POA: Diagnosis not present

## 2023-05-19 NOTE — Telephone Encounter (Signed)
medicaid NPR sent to GI [phone] 

## 2023-05-20 ENCOUNTER — Institutional Professional Consult (permissible substitution): Payer: Medicaid Other | Admitting: Neurology

## 2023-05-20 LAB — MULTIPLE MYELOMA PANEL, SERUM

## 2023-05-20 LAB — VITAMIN B12: Vitamin B-12: 320 pg/mL (ref 232–1245)

## 2023-05-20 LAB — ANTI-HU, ANTI-RI, ANTI-YO IFA
Anti-Hu Ab: NEGATIVE
Anti-Ri Ab: NEGATIVE
Anti-Yo Ab: NEGATIVE

## 2023-05-20 LAB — COPPER, SERUM

## 2023-05-22 LAB — MULTIPLE MYELOMA PANEL, SERUM

## 2023-05-25 ENCOUNTER — Telehealth: Payer: Self-pay

## 2023-05-25 LAB — MULTIPLE MYELOMA PANEL, SERUM

## 2023-05-25 NOTE — Transitions of Care (Post Inpatient/ED Visit) (Signed)
   05/25/2023  Name: KATIANA CAPPIELLO MRN: [id number] DOB: [birth year]  Today's TOC FU Call Status: Today's TOC FU Call Status:: Unsuccessul Call (1st Attempt) Unsuccessful Call (1st Attempt) Date: 05/25/23  Attempted to reach the patient regarding the most recent Inpatient/ED visit.  Follow Up Plan: Additional outreach attempts will be made to reach the patient to complete the Transitions of Care (Post Inpatient/ED visit) call.   Signature Karena Addison, LPN [facility] Nurse Health Advisor Direct Dial [phone]

## 2023-05-25 NOTE — Transitions of Care (Post Inpatient/ED Visit) (Signed)
05/25/2023  Name: Sheryl Hall MRN: [id number] DOB: 05/18/1961  Today's TOC FU Call Status: Today's TOC FU Call Status:: Successful TOC FU Call Competed Unsuccessful Call (1st Attempt) Date: 05/25/23 [facility] FU Call Complete Date: 05/25/23  Transition Care Management Follow-up Telephone Call Date of Discharge: 05/21/23 Discharge Facility: Other ([facility]) Name of Other ([facility]) Discharge Facility: [facility] Type of Discharge: Inpatient Admission Primary Inpatient Discharge Diagnosis:: muscle disorder How have you been since you were released from the hospital?: Better Any questions or concerns?: No  Items Reviewed: Did you receive and understand the discharge instructions provided?: Yes Medications obtained,verified, and reconciled?: Yes (Medications Reviewed) Any new allergies since your discharge?: No Dietary orders reviewed?: NA Do you have support at home?: No  Medications Reviewed Today: Medications Reviewed Today     Reviewed by Karena Addison, LPN (Licensed Practical Nurse) on 05/25/23 at 1318  Med List Status: <None>   Medication Order Taking? Sig Documenting Provider Last Dose Status Informant  amoxicillin (AMOXIL) 500 MG tablet [id number] No Take 500 mg by mouth 3 (three) times daily. [provider] Taking Active   atorvastatin (LIPITOR) 40 MG tablet [id number] No TAKE (1) TABLET BY MOUTH ONCE DAILY.  Patient taking differently: Take 10 mg by mouth daily.   Donita Mathe, MD Taking Active Self  cyanocobalamin 1000 MCG tablet [id number] No Take 1 tablet (1,000 mcg total) by mouth daily. Burnadette Pop, MD Taking Active   feeding supplement (ENSURE ENLIVE / ENSURE PLUS) LIQD [id number] No Take 237 mLs by mouth 2 (two) times daily between meals. Burnadette Pop, MD Taking Active   levETIRAcetam (KEPPRA) 1000 MG tablet [id number] No Take 0.5 tablet in morning and 1.5 tablet in evening.  Patient taking differently: Take 1,000 mg by mouth 2 (two)  times daily.   Glean Salvo, NP Taking Active Self  levothyroxine (SYNTHROID) 50 MCG tablet [id number] No TAKE ONE TABLET BY MOUTH ONCE DAILY. Donita Mikus, MD Taking Active Self  Patient taking differently:  Discontinued 02/29/20 1516 lithium 300 MG tablet [id number] No Take 300 mg by mouth 3 (three) times daily. [provider] Taking Active   loperamide (IMODIUM) 2 MG capsule [id number] No Take 1 capsule (2 mg total) by mouth every 6 (six) hours as needed for diarrhea or loose stools. Burnadette Pop, MD Taking Active   melatonin 3 MG TABS tablet [id number] No Take 2 tablets (6 mg total) by mouth at bedtime. Burnadette Pop, MD Taking Active   mirtazapine (REMERON) 7.5 MG tablet [id number] No Take 1 tablet (7.5 mg total) by mouth at bedtime.  Patient taking differently: Take 30 mg by mouth at bedtime.   Burnadette Pop, MD Taking Active   Multiple Vitamins-Minerals (ONE-A-DAY WOMENS 50+ PO) [id number] No Take 1 tablet by mouth daily. [provider] Taking Active Self  omeprazole (PRILOSEC) 40 MG capsule [id number] No TAKE (1) CAPSULE BY MOUTH TWICE DAILY.  Patient taking differently: Take 40 mg by mouth daily.   Donita Rebman, MD Taking Active Self  pregabalin (LYRICA) 50 MG capsule [id number] No Take 50 mg by mouth 2 (two) times daily. [provider] Taking Active   senna (SENOKOT) 8.6 MG tablet [id number] No Take 2 tablets by mouth daily. [provider] Taking Active   UNABLE TO FIND [id number] No Med Name: BIOFREEZE EXTERNAL GEL 4% [provider] Taking Active   Med List Note Rolene Course 07/21/19 1807): Pharmacy records were used to confirm all medications  Home Care and Equipment/Supplies: Were Home Health Services Ordered?: NA Any new equipment or medical supplies ordered?: NA  Functional Questionnaire: Do you need assistance with bathing/showering or dressing?: No Do you need assistance with meal preparation?:  No Do you need assistance with eating?: No Do you have difficulty maintaining continence: No Do you need assistance with getting out of bed/getting out of a chair/moving?: No Do you have difficulty managing or taking your medications?: No  Follow up appointments reviewed: PCP Follow-up appointment confirmed?: Yes Date of PCP follow-up appointment?: 06/05/23 Follow-up Provider: [facility] Follow-up appointment confirmed?: NA Do you need transportation to your follow-up appointment?: No Do you understand care options if your condition(s) worsen?: Yes-patient verbalized understanding    SIGNATURE Karena Addison, LPN [facility] Nurse Health Advisor Direct Dial [phone]

## 2023-05-26 LAB — MULTIPLE MYELOMA PANEL, SERUM
Alpha 1: 0.2 g/dL (ref 0.0–0.4)
B-Globulin SerPl Elph-Mcnc: 0.9 g/dL (ref 0.7–1.3)
Globulin, Total: 2.5 g/dL (ref 2.2–3.9)
IgA/Immunoglobulin A, Serum: 101 mg/dL (ref 87–352)

## 2023-05-26 LAB — VITAMIN B1: Thiamine: 158.2 nmol/L (ref 66.5–200.0)

## 2023-06-05 ENCOUNTER — Telehealth: Payer: Self-pay | Admitting: Family Medicine

## 2023-06-05 ENCOUNTER — Other Ambulatory Visit (INDEPENDENT_AMBULATORY_CARE_PROVIDER_SITE_OTHER): Payer: MEDICAID

## 2023-06-05 ENCOUNTER — Ambulatory Visit (INDEPENDENT_AMBULATORY_CARE_PROVIDER_SITE_OTHER): Payer: MEDICAID | Admitting: Family Medicine

## 2023-06-05 ENCOUNTER — Encounter: Payer: Self-pay | Admitting: Family Medicine

## 2023-06-05 VITALS — BP 118/62 | HR 104 | Temp 98.4°F | Ht 66.0 in | Wt 161.0 lb

## 2023-06-05 DIAGNOSIS — F319 Bipolar disorder, unspecified: Secondary | ICD-10-CM

## 2023-06-05 DIAGNOSIS — M6281 Muscle weakness (generalized): Secondary | ICD-10-CM | POA: Diagnosis not present

## 2023-06-05 DIAGNOSIS — E7800 Pure hypercholesterolemia, unspecified: Secondary | ICD-10-CM

## 2023-06-05 DIAGNOSIS — E538 Deficiency of other specified B group vitamins: Secondary | ICD-10-CM

## 2023-06-05 DIAGNOSIS — E038 Other specified hypothyroidism: Secondary | ICD-10-CM

## 2023-06-05 DIAGNOSIS — E0590 Thyrotoxicosis, unspecified without thyrotoxic crisis or storm: Secondary | ICD-10-CM

## 2023-06-05 DIAGNOSIS — K219 Gastro-esophageal reflux disease without esophagitis: Secondary | ICD-10-CM

## 2023-06-05 DIAGNOSIS — G6289 Other specified polyneuropathies: Secondary | ICD-10-CM

## 2023-06-05 DIAGNOSIS — G40209 Localization-related (focal) (partial) symptomatic epilepsy and epileptic syndromes with complex partial seizures, not intractable, without status epilepticus: Secondary | ICD-10-CM

## 2023-06-05 DIAGNOSIS — I639 Cerebral infarction, unspecified: Secondary | ICD-10-CM

## 2023-06-05 DIAGNOSIS — G609 Hereditary and idiopathic neuropathy, unspecified: Secondary | ICD-10-CM

## 2023-06-05 DIAGNOSIS — K581 Irritable bowel syndrome with constipation: Secondary | ICD-10-CM

## 2023-06-05 MED ORDER — PREGABALIN 50 MG PO CAPS
50.0000 mg | ORAL_CAPSULE | Freq: Two times a day (BID) | ORAL | 3 refills | Status: DC
Start: 2023-06-05 — End: 2023-06-15

## 2023-06-05 MED ORDER — SENNOSIDES 8.6 MG PO TABS
2.0000 | ORAL_TABLET | Freq: Every day | ORAL | 3 refills | Status: DC
Start: 2023-06-05 — End: 2023-11-27

## 2023-06-05 MED ORDER — MIRTAZAPINE 30 MG PO TABS
30.0000 mg | ORAL_TABLET | Freq: Every day | ORAL | 3 refills | Status: DC
Start: 2023-06-05 — End: 2023-10-05

## 2023-06-05 MED ORDER — CYANOCOBALAMIN 1000 MCG/ML IJ SOLN
1000.0000 ug | Freq: Once | INTRAMUSCULAR | Status: AC
Start: 2023-06-05 — End: 2023-06-05
  Administered 2023-06-05: 1000 ug via INTRAMUSCULAR

## 2023-06-05 MED ORDER — LEVOTHYROXINE SODIUM 50 MCG PO TABS
50.0000 ug | ORAL_TABLET | Freq: Every day | ORAL | 3 refills | Status: DC
Start: 2023-06-05 — End: 2023-11-26

## 2023-06-05 MED ORDER — LEVETIRACETAM 1000 MG PO TABS
1000.0000 mg | ORAL_TABLET | Freq: Two times a day (BID) | ORAL | 3 refills | Status: DC
Start: 2023-06-05 — End: 2023-12-04

## 2023-06-05 MED ORDER — CYANOCOBALAMIN 1000 MCG/ML IJ SOLN
1000.0000 ug | Freq: Once | INTRAMUSCULAR | 0 refills | Status: AC
Start: 2023-06-05 — End: 2023-06-05

## 2023-06-05 MED ORDER — MELATONIN 3 MG PO TABS
6.0000 mg | ORAL_TABLET | Freq: Every day | ORAL | 3 refills | Status: DC
Start: 2023-06-05 — End: 2024-03-31

## 2023-06-05 MED ORDER — LITHIUM CARBONATE 300 MG PO TABS
300.0000 mg | ORAL_TABLET | Freq: Every day | ORAL | 3 refills | Status: AC
Start: 2023-06-05 — End: ?

## 2023-06-05 MED ORDER — OMEPRAZOLE 40 MG PO CPDR
40.0000 mg | DELAYED_RELEASE_CAPSULE | Freq: Every day | ORAL | 3 refills | Status: DC
Start: 2023-06-05 — End: 2023-10-28

## 2023-06-05 NOTE — Telephone Encounter (Signed)
Received call from Matthias Hughs, LPN with [facility] (client care coordinator). Requesting for Dr. Tanya Nones to write a script for a 3 in 1 shower chair to help with showers and using the bathroom when patient comes in for today's visit. Matthias Hughs also requesting for [place] to give patient a copy of her med list so they can update her profile at [facility].  Please advise Matthias Hughs with any questions at [phone].

## 2023-06-05 NOTE — Progress Notes (Signed)
Subjective:    Patient ID: Sheryl Hall, female    DOB: 1961-08-29, 62 y.o.   MRN: [id number] Admit date: 03/02/2023 Discharge date: 03/06/2023    Brief/Interim Summary: Patient is a 62 year old female with history of hypertension, hyperlipidemia, GERD, hypothyroidism who presented with generalized weakness, dizziness, poor appetite.  Started feeling weak 3 months ago.  Has very low oral intake since last 3 to 4 weeks.  History of weight loss of 20 to 25 pounds in last 2 to 3 months.  Also complains of severe pain and tingling and burning on bilateral lower extremity.  On presentation, she was hemodynamically stable.  Lab work showed potassium of 2.6, low magnesium.  Patient admitted for further workup.  PT OT consulted, recommended SNF.  Vitamin B12 found to be low,started supplementation.  Medically stable for discharge to SNF today.  We recommend follow-up with neurology as an outpatient.   Following problems were addressed during the hospitalization:   Generalized weakness/failure to thrive: Not eating, drinking since last several weeks.  Complain of generalized weakness, weight loss.  Found to be severely deficient in vitamin B12.   TSH normal. PT/OT, dietitian consulted. PT recommending SNF on discharge.  TOC following. Her depression cud be also contributing to this. She is not suicidal. Checked ESR, CRP to rule out polymyalgia rheumatica, found to be normal.  She complains of generalized weakness and pain everywhere.   Vitamin B12 deficiency: Vitamin B12 138.  Given daily IM supplementation ,now on oral supplement.     Depression: Complains of severe depression but denies any suicidal ideations or thoughts.  Psychiatry consulted, recommended to start on mirtazapine.   Hypomagnesemia/hypokalemia: Supplemented and corrected   Dehydration: Secondary to poor oral intake.Given  IV fluids.  Renal function intact.   Syncopal episode: Happened in the morning of 4/9.  Blood pressure  stable.   Tingling/burning of bilateral lower extremity: This might  secondary to vitamin B12 deficiency causing neuropathy.  Continue supplementation. Started on low-dose gabapentin.  We recommend follow-up with neurology as an outpatient for possible EMG/nerve conduction test as an outpatient.  Referral provided.   Hypothyroidism: Continue Synthyroid.  TSH normal.   History of bipolar disorder/anxiety/depression: On Lexapro, olanzapine, trazodone from home.  We recommend to follow-up with psychiatry as an outpatient.  Currently holding olanzapine, trazodone for now.  She has an appointment with psychiatry as an outpatient.  Started on mirtazapine   GERD: Continue PPI   History of seizure disorder: Takes Keppra.  Denies any recent history of seizures.  No evidence of seizure as per EEG.   Hyperlipidemia: Continue statin   Colitis:CT abd/pelvis showed mild diffuse colitis.Unclear etiology.No abd pain,fever or leucocytosis .pending GI pathogen panel, C. difficile.  Complain of diarrhea so ordered Imodium.  Diarrhea has slowing down. She will benefit by having a colonoscopy as outpatient in 6-8 weeks by following up with gastroenterology  06/05/23  Patient has been in a rehab facility ever since her hospitalization in April.  She has been home since late June.  She is here today in a wheelchair.  She can stand up on her iron however she is very unsteady on her feet.  She states that with walking, she will have inversion of her foot that sometimes will trip her.  She reports numbness in her legs distal to the knee.  She has no sensation to 10 g monofilament bilaterally distal to the knee.  However she has muscle strength 5/5 equal and symmetric in both legs.  Hip  flexion, extension, knee flexion, knee extension, ankle flexion and ankle extension are all normal.  She does have diminished proprioception and numbness which could be contributing to her unsteadiness on her feet.  She states she is lost  strength in her legs since she has come home from the rehab facility because she is not in physical therapy.  She is taking oral B12.  Her B12 levels have risen from 138 to over 300.  She lost almost 40 pounds at home.  She states that she was 191 pounds prior to going to the hospital.  However when she discharged the hospital just for the 281 pounds.  She has since gained approximately 10 pounds because she feels like she is eating better while she was in the rehab facility.  Therefore I believe that she was experiencing cachexia and weight loss secondary to anorexia.  She is getting her groceries delivered by her sister and is eating at least 2 meals a day but she is not receiving physical therapy.  She is still taking a statin Past Medical History:  Diagnosis Date   Anxiety    Arthritis    Avascular necrosis of hip (HCC)    left   Bipolar 1 disorder (HCC)    Chronic left hip pain    Chronic nausea    Depression    Diverticulosis of colon    Family hx of colon cancer    age 54-father   Folate deficiency    GERD (gastroesophageal reflux disease)    Headache    Heart murmur    Hx of abnormal Pap smear    Hyperlipidemia    Hyperthyroidism    Keratosis, actinic    Low back pain 03/01/2013   MRI with multiple levels of disc bulge   Panic attacks    Polysubstance abuse (HCC)    PTSD (post-traumatic stress disorder)    S/P colonoscopy 05/30/2010   LAX sphincter tone, anal papilla, left-sided diverticulosis, normal random biopsies,, 1 polyp-TA   S/P endoscopy 05/30/2010   Dr Rourk-> non--critical Schatzki's ring, s/p 39F dilation   Seizures (HCC)    Stroke (HCC) 2014   Right parietal, no deficits    Tubular adenoma of colon 05/30/2010   Next colonoscopy 05/2015   UTI (urinary tract infection)    Past Surgical History:  Procedure Laterality Date   COLONOSCOPY     every 5 years   COLONOSCOPY  05/30/2010   RMR:lax anal sphincter tone,anal papilla,otherwise normal/left-sided diverticula    ESOPHAGOGASTRODUODENOSCOPY  05/30/2010   ZOX:[id number] ring/small HH otherwise normal   EXTERNAL EAR SURGERY Left 12 years ago   skin graft from behind ear put in ear canal   JOINT REPLACEMENT     NECK SURGERY  10 years ago   SKIN CANCER DESTRUCTION     TOTAL HIP ARTHROPLASTY Left 04/19/2014   Procedure: LEFT TOTAL HIP ARTHROPLASTY ANTERIOR APPROACH;  Surgeon: Loanne Drilling, MD;  Location: [facility];  Service: Orthopedics;  Laterality: Left;   TOTAL HIP ARTHROPLASTY Right 05/23/2015   Procedure: RIGHT TOTAL HIP ARTHROPLASTY ANTERIOR APPROACH;  Surgeon: Ollen Gross, MD;  Location: [facility];  Service: Orthopedics;  Laterality: Right;   Current Outpatient Medications on File Prior to Visit  Medication Sig Dispense Refill   atorvastatin (LIPITOR) 40 MG tablet TAKE (1) TABLET BY MOUTH ONCE DAILY. (Patient taking differently: Take 10 mg by mouth daily.) 30 tablet 0   cyanocobalamin 1000 MCG tablet Take 1 tablet (1,000 mcg total) by mouth daily.  feeding supplement (ENSURE ENLIVE / ENSURE PLUS) LIQD Take 237 mLs by mouth 2 (two) times daily between meals. 237 mL 12   levETIRAcetam (KEPPRA) 1000 MG tablet Take 0.5 tablet in morning and 1.5 tablet in evening. (Patient taking differently: Take 1,000 mg by mouth 2 (two) times daily.) 180 tablet 3   levothyroxine (SYNTHROID) 50 MCG tablet TAKE ONE TABLET BY MOUTH ONCE DAILY. 90 tablet 0   lithium 300 MG tablet Take 300 mg by mouth 3 (three) times daily.     melatonin 3 MG TABS tablet Take 2 tablets (6 mg total) by mouth at bedtime.  0   mirtazapine (REMERON) 7.5 MG tablet Take 1 tablet (7.5 mg total) by mouth at bedtime. (Patient taking differently: Take 30 mg by mouth at bedtime.)     omeprazole (PRILOSEC) 40 MG capsule TAKE (1) CAPSULE BY MOUTH TWICE DAILY. (Patient taking differently: Take 40 mg by mouth daily.) 60 capsule 0   pregabalin (LYRICA) 50 MG capsule Take 50 mg by mouth 2 (two) times daily.     senna (SENOKOT) 8.6 MG tablet Take 2  tablets by mouth daily.     amoxicillin (AMOXIL) 500 MG tablet Take 500 mg by mouth 3 (three) times daily. (Patient not taking: Reported on 06/05/2023)     loperamide (IMODIUM) 2 MG capsule Take 1 capsule (2 mg total) by mouth every 6 (six) hours as needed for diarrhea or loose stools. (Patient not taking: Reported on 06/05/2023) 30 capsule 0   Multiple Vitamins-Minerals (ONE-A-DAY WOMENS 50+ PO) Take 1 tablet by mouth daily. (Patient not taking: Reported on 06/05/2023)     UNABLE TO FIND Med Name: Nita Sells EXTERNAL GEL 4% (Patient not taking: Reported on 06/05/2023)     [DISCONTINUED] LINZESS 72 MCG capsule TAKE ONE CAPSULE ORALLY EVERY MORNING BEFORE BREAKFAST. (Patient taking differently: Take 72 mcg by mouth daily before breakfast. ) 30 capsule 1   No current facility-administered medications on file prior to visit.   No Known Allergies Social History   Socioeconomic History   Marital status: Divorced    Spouse name: Not on file   Number of children: 1   Years of education: Not on file   Highest education level: Not on file  Occupational History   Occupation: disabled  Tobacco Use   Smoking status: Former    Current packs/day: 0.00    Average packs/day: 0.5 packs/day for 30.0 years (15.0 ttl pk-yrs)    Types: Cigarettes    Start date: 04/24/1992    Quit date: 04/24/2022    Years since quitting: 1.1   Smokeless tobacco: Never   Tobacco comments:    trying to quit  Vaping Use   Vaping status: Never Used  Substance and Sexual Activity   Alcohol use: No    Alcohol/week: 0.0 standard drinks of alcohol   Drug use: No    Comment: hx cocaine abuse   Sexual activity: Never    Birth control/protection: None  Other Topics Concern   Not on file  Social History Narrative   Patient lives at home alone and she is single.    Disabled.   Education college education.   Right handed.   Caffeine mountain dew four daily.    Social Determinants of Health   Financial Resource Strain: Not on  file  Food Insecurity: No Food Insecurity (03/03/2023)   Hunger Vital Sign    Worried About Running Out of Food in the Last Year: Never true    Ran Out of  Food in the Last Year: Never true  Transportation Needs: No Transportation Needs (03/03/2023)   PRAPARE - Administrator, Civil Service (Medical): No    Lack of Transportation (Non-Medical): No  Physical Activity: Not on file  Stress: Not on file  Social Connections: Not on file  Intimate Partner Violence: Not At Risk (03/03/2023)   Humiliation, Afraid, Rape, and Kick questionnaire    Fear of Current or Ex-Partner: No    Emotionally Abused: No    Physically Abused: No    Sexually Abused: No     Review of Systems  All other systems reviewed and are negative.      Objective:   Physical Exam Vitals reviewed.  Constitutional:      General: She is not in acute distress.    Appearance: She is well-developed. She is not diaphoretic.  HENT:     Right Ear: Tympanic membrane, ear canal and external ear normal.     Left Ear: Tympanic membrane, ear canal and external ear normal.     Nose: Nose normal.     Mouth/Throat:     Pharynx: No oropharyngeal exudate.  Eyes:     Extraocular Movements: Extraocular movements intact.     Pupils: Pupils are equal, round, and reactive to light.  Neck:     Thyroid: No thyromegaly.     Vascular: No JVD.  Cardiovascular:     Rate and Rhythm: Normal rate and regular rhythm.     Heart sounds: Normal heart sounds. No murmur heard. Pulmonary:     Effort: Pulmonary effort is normal. No respiratory distress.     Breath sounds: Normal breath sounds. No stridor. No wheezing, rhonchi or rales.  Abdominal:     General: Bowel sounds are normal. There is no distension.     Palpations: Abdomen is soft.     Tenderness: There is no abdominal tenderness. There is no guarding or rebound.  Genitourinary:    Labia:        Right: No rash.        Left: No rash.      Vagina: Normal.     Cervix: Normal.      Uterus: Normal.      Adnexa: Right adnexa normal and left adnexa normal.  Musculoskeletal:     Cervical back: Neck supple.     Right lower leg: No edema.     Left lower leg: No edema.  Lymphadenopathy:     Cervical: No cervical adenopathy.  Skin:    Coloration: Skin is not pale.     Findings: No erythema or rash.  Neurological:     Mental Status: She is alert and oriented to person, place, and time.     Cranial Nerves: No cranial nerve deficit.     Motor: No abnormal muscle tone.     Coordination: Coordination normal.  Psychiatric:        Behavior: Behavior normal.        Thought Content: Thought content normal.        Judgment: Judgment normal.           Assessment & Plan:  Pure hypercholesterolemia - Plan: CK, Lipid panel, COMPLETE METABOLIC PANEL WITH GFR  Other specified hypothyroidism  B12 deficiency  Muscle weakness - Plan: Ambulatory referral to Home Health  Idiopathic peripheral neuropathy - Plan: Ambulatory referral to Home Health  Patient presents with muscle weakness in her legs and profound peripheral neuropathy.  She is seeing neurology.  I reviewed  their most recent office notes.  SPEP was negative for multiple myeloma.  B12 level is up to over 320.  Copper levels and thiamine were also normal.  Sed rate in April was normal.  Therefore I suspect that her neuropathy is secondary to B12 deficiency due to anorexia and cachexia.  She seems to be maintaining her B12 levels on oral B12 replacement however I believe the parenteral B12 will be a safer option given her poor appetite and poor nutritional status.  Therefore I recommended that she continue B12 1000 mcg IM monthly.  We administered the first dose here today and I recommended and sent in a prescription for this to be continued as an outpatient.  Neurology is scheduling her for MRIs of the brain and spine to evaluate for any other potential causes.  Patient's muscle weakness was profound.  Therefore I  recommended stopping her statin and I will check a CK level.  I will to schedule the patient for home health physical therapy so that we do not lose the gains that she made while in rehab.

## 2023-06-06 LAB — COMPLETE METABOLIC PANEL WITH GFR
AG Ratio: 2.2 (calc) (ref 1.0–2.5)
ALT: 7 U/L (ref 6–29)
AST: 12 U/L (ref 10–35)
Albumin: 4.1 g/dL (ref 3.6–5.1)
Alkaline phosphatase (APISO): 94 U/L (ref 37–153)
BUN: 9 mg/dL (ref 7–25)
CO2: 25 mmol/L (ref 20–32)
Calcium: 9 mg/dL (ref 8.6–10.4)
Chloride: 108 mmol/L (ref 98–110)
Creat: 0.93 mg/dL (ref 0.50–1.05)
Globulin: 1.9 g/dL (calc) (ref 1.9–3.7)
Glucose, Bld: 104 mg/dL — ABNORMAL HIGH (ref 65–99)
Potassium: 3.8 mmol/L (ref 3.5–5.3)
Sodium: 142 mmol/L (ref 135–146)
Total Bilirubin: 0.5 mg/dL (ref 0.2–1.2)
Total Protein: 6 g/dL — ABNORMAL LOW (ref 6.1–8.1)
eGFR: 70 mL/min/{1.73_m2} (ref >=60)

## 2023-06-06 LAB — LIPID PANEL
Cholesterol: 139 mg/dL (ref <200)
HDL: 45 mg/dL — ABNORMAL LOW (ref >=50)
LDL Cholesterol (Calc): 79 mg/dL (calc)
Non-HDL Cholesterol (Calc): 94 mg/dL (calc) (ref <130)
Total CHOL/HDL Ratio: 3.1 (calc) (ref <5.0)
Triglycerides: 72 mg/dL (ref <150)

## 2023-06-06 LAB — EXTRA LAV TOP TUBE

## 2023-06-06 LAB — CK: Total CK: 15 U/L — ABNORMAL LOW (ref 29–143)

## 2023-06-15 ENCOUNTER — Other Ambulatory Visit: Payer: Self-pay | Admitting: Family Medicine

## 2023-06-15 DIAGNOSIS — G6289 Other specified polyneuropathies: Secondary | ICD-10-CM

## 2023-06-16 ENCOUNTER — Telehealth: Payer: Self-pay | Admitting: Family Medicine

## 2023-06-16 ENCOUNTER — Ambulatory Visit: Payer: Self-pay | Admitting: Neurology

## 2023-06-16 NOTE — Telephone Encounter (Signed)
Patient called to follow up on recent refill received from [facility] for   pregabalin (LYRICA) 50 MG capsule .  Script only gave 20 pills; patient stated she takes two (2) pills daily.   Requesting for a new script to be sent to the pharmacy  Pharmacy confirmed as:   [facility] - [place], [place] - [id number], [place] [address] Phone: [phone]  Fax: [phone]   Please advise patient at [phone].

## 2023-06-18 ENCOUNTER — Other Ambulatory Visit: Payer: Self-pay | Admitting: Family Medicine

## 2023-06-18 DIAGNOSIS — G6289 Other specified polyneuropathies: Secondary | ICD-10-CM

## 2023-06-18 MED ORDER — PREGABALIN 50 MG PO CAPS
50.0000 mg | ORAL_CAPSULE | Freq: Two times a day (BID) | ORAL | 0 refills | Status: DC
Start: 2023-06-18 — End: 2023-07-02

## 2023-06-22 ENCOUNTER — Telehealth: Payer: MEDICAID | Admitting: Family Medicine

## 2023-06-22 NOTE — Telephone Encounter (Signed)
Patient called to check on status of referral discussed with provider during last visit for physical therapy; stated she hasn't been contacted for scheduling.  Requesting call back.   Please advise at [phone].

## 2023-06-26 ENCOUNTER — Telehealth: Payer: Self-pay | Admitting: Family Medicine

## 2023-06-26 NOTE — Telephone Encounter (Signed)
Tried to call pt back again to discuss. No answer and no voice mail. Mjp,lpn

## 2023-06-26 NOTE — Telephone Encounter (Signed)
Spoke with pt. Pt states sx have been going on x 2 weeks. Pt states it is hard for her to stay awake during the day. Pt states she is taking the medicaiton in AM and PM. Pt states she feels "off balance."

## 2023-06-26 NOTE — Telephone Encounter (Signed)
Patient called to report adverse affects due to taking prescribed medication pregabalin (LYRICA) 50 MG capsule   Sx each morning increasingly worsening:  - within 30 minutes of taking, patient totally exhausted - hands and legs swelling at night - confusion - difficulty concentrating - unsteady on her feet when she stands up from her wheelchair - weird dreams  Requesting call back. Please advise at [phone].

## 2023-06-29 NOTE — Telephone Encounter (Signed)
[facility] has called to report that pt's insurance is not in network, pt needs order sent somewhere else for less out of pocket expense.

## 2023-06-30 NOTE — Telephone Encounter (Signed)
Received call from Dayneicha with [facility] regarding referral received for patient to have PT. They have to decline the referral due to staffing.   Please advise patient at [phone].

## 2023-06-30 NOTE — Telephone Encounter (Signed)
Patient called to report she tried to come off of the [facility] but had to take it due to unbearable pain in her feet.  Patient stated she's unable to stop taking it. Requesting call back with best advice; also stated her phone is working.   Patient stated Gabapentin doesn't work well for her; when taken in the past, it made her very dizzy.   Please advise at [phone].

## 2023-07-02 ENCOUNTER — Other Ambulatory Visit: Payer: MEDICAID

## 2023-07-02 ENCOUNTER — Other Ambulatory Visit: Payer: Self-pay | Admitting: Family Medicine

## 2023-07-02 DIAGNOSIS — G6289 Other specified polyneuropathies: Secondary | ICD-10-CM

## 2023-07-02 DIAGNOSIS — R29898 Other symptoms and signs involving the musculoskeletal system: Secondary | ICD-10-CM

## 2023-07-02 MED ORDER — PREGABALIN 25 MG PO CAPS: 25.0000 mg | ORAL_CAPSULE | Freq: Two times a day (BID) | ORAL | 3 refills | Status: DC

## 2023-07-06 ENCOUNTER — Telehealth: Payer: Self-pay | Admitting: Family Medicine

## 2023-07-06 ENCOUNTER — Encounter: Payer: Self-pay | Admitting: Family Medicine

## 2023-07-06 ENCOUNTER — Ambulatory Visit: Payer: MEDICAID | Admitting: Family Medicine

## 2023-07-06 VITALS — BP 120/72 | HR 95 | Temp 98.4°F | Ht 66.0 in | Wt 166.8 lb

## 2023-07-06 DIAGNOSIS — F319 Bipolar disorder, unspecified: Secondary | ICD-10-CM | POA: Diagnosis not present

## 2023-07-06 DIAGNOSIS — G6289 Other specified polyneuropathies: Secondary | ICD-10-CM

## 2023-07-06 DIAGNOSIS — E538 Deficiency of other specified B group vitamins: Secondary | ICD-10-CM | POA: Diagnosis not present

## 2023-07-06 DIAGNOSIS — R29898 Other symptoms and signs involving the musculoskeletal system: Secondary | ICD-10-CM | POA: Diagnosis not present

## 2023-07-06 MED ORDER — CYANOCOBALAMIN 1000 MCG/ML IJ SOLN
1000.0000 ug | Freq: Once | INTRAMUSCULAR | Status: AC
Start: 2023-07-06 — End: 2023-07-06
  Administered 2023-07-06: 1000 ug via INTRAMUSCULAR

## 2023-07-06 NOTE — Telephone Encounter (Signed)
Attempted to call pt this morning, no answer/no VM. Will try again later

## 2023-07-06 NOTE — Telephone Encounter (Signed)
Patient called requesting a referral to be placed for [facility] for physical therapy to come to her home.  CB# [phone]

## 2023-07-06 NOTE — Progress Notes (Signed)
Subjective:    Patient ID: Sheryl Hall, female    DOB: October 03, 1961, 62 y.o.   MRN: [id number] Admit date: 03/02/2023 Discharge date: 03/06/2023    Brief/Interim Summary: Patient is a 62 year old female with history of hypertension, hyperlipidemia, GERD, hypothyroidism who presented with generalized weakness, dizziness, poor appetite.  Started feeling weak 3 months ago.  Has very low oral intake since last 3 to 4 weeks.  History of weight loss of 20 to 25 pounds in last 2 to 3 months.  Also complains of severe pain and tingling and burning on bilateral lower extremity.  On presentation, she was hemodynamically stable.  Lab work showed potassium of 2.6, low magnesium.  Patient admitted for further workup.  PT OT consulted, recommended SNF.  Vitamin B12 found to be low,started supplementation.  Medically stable for discharge to SNF today.  We recommend follow-up with neurology as an outpatient.   Following problems were addressed during the hospitalization:   Generalized weakness/failure to thrive: Not eating, drinking since last several weeks.  Complain of generalized weakness, weight loss.  Found to be severely deficient in vitamin B12.   TSH normal. PT/OT, dietitian consulted. PT recommending SNF on discharge.  TOC following. Her depression cud be also contributing to this. She is not suicidal. Checked ESR, CRP to rule out polymyalgia rheumatica, found to be normal.  She complains of generalized weakness and pain everywhere.   Vitamin B12 deficiency: Vitamin B12 138.  Given daily IM supplementation ,now on oral supplement.     Depression: Complains of severe depression but denies any suicidal ideations or thoughts.  Psychiatry consulted, recommended to start on mirtazapine.   Hypomagnesemia/hypokalemia: Supplemented and corrected   Dehydration: Secondary to poor oral intake.Given  IV fluids.  Renal function intact.   Syncopal episode: Happened in the morning of 4/9.  Blood pressure  stable.   Tingling/burning of bilateral lower extremity: This might  secondary to vitamin B12 deficiency causing neuropathy.  Continue supplementation. Started on low-dose gabapentin.  We recommend follow-up with neurology as an outpatient for possible EMG/nerve conduction test as an outpatient.  Referral provided.   Hypothyroidism: Continue Synthyroid.  TSH normal.   History of bipolar disorder/anxiety/depression: On Lexapro, olanzapine, trazodone from home.  We recommend to follow-up with psychiatry as an outpatient.  Currently holding olanzapine, trazodone for now.  She has an appointment with psychiatry as an outpatient.  Started on mirtazapine   GERD: Continue PPI   History of seizure disorder: Takes Keppra.  Denies any recent history of seizures.  No evidence of seizure as per EEG.   Hyperlipidemia: Continue statin   Colitis:CT abd/pelvis showed mild diffuse colitis.Unclear etiology.No abd pain,fever or leucocytosis .pending GI pathogen panel, C. difficile.  Complain of diarrhea so ordered Imodium.  Diarrhea has slowing down. She will benefit by having a colonoscopy as outpatient in 6-8 weeks by following up with gastroenterology  06/05/23  Patient has been in a rehab facility ever since her hospitalization in April.  She has been home since late June.  She is here today in a wheelchair.  She can stand up on her iron however she is very unsteady on her feet.  She states that with walking, she will have inversion of her foot that sometimes will trip her.  She reports numbness in her legs distal to the knee.  She has no sensation to 10 g monofilament bilaterally distal to the knee.  However she has muscle strength 5/5 equal and symmetric in both legs.  Hip  flexion, extension, knee flexion, knee extension, ankle flexion and ankle extension are all normal.  She does have diminished proprioception and numbness which could be contributing to her unsteadiness on her feet.  She states she is lost  strength in her legs since she has come home from the rehab facility because she is not in physical therapy.  She is taking oral B12.  Her B12 levels have risen from 138 to over 300.  She lost almost 40 pounds at home.  She states that she was 191 pounds prior to going to the hospital.  However when she discharged the hospital just for the 281 pounds.  She has since gained approximately 10 pounds because she feels like she is eating better while she was in the rehab facility.  Therefore I believe that she was experiencing cachexia and weight loss secondary to anorexia.  She is getting her groceries delivered by her sister and is eating at least 2 meals a day but she is not receiving physical therapy.  She is still taking a statin.  At that time, my plan was: Patient presents with muscle weakness in her legs and profound peripheral neuropathy.  She is seeing neurology.  I reviewed their most recent office notes.  SPEP was negative for multiple myeloma.  B12 level is up to over 320.  Copper levels and thiamine were also normal.  Sed rate in April was normal.  Therefore I suspect that her neuropathy is secondary to B12 deficiency due to anorexia and cachexia.  She seems to be maintaining her B12 levels on oral B12 replacement however I believe the parenteral B12 will be a safer option given her poor appetite and poor nutritional status.  Therefore I recommended that she continue B12 1000 mcg IM monthly.  We administered the first dose here today and I recommended and sent in a prescription for this to be continued as an outpatient.  Neurology is scheduling her for MRIs of the brain and spine to evaluate for any other potential causes.  Patient's muscle weakness was profound.  Therefore I recommended stopping her statin and I will check a CK level.  I will to schedule the patient for home health physical therapy so that we do not lose the gains that she made while in rehab.  07/06/23 Wt Readings from Last 3  Encounters:  07/06/23 166 lb 12.8 oz (75.7 kg)  06/05/23 161 lb (73 kg)  03/06/23 168 lb 6.9 oz (76.4 kg)   Patient has gained weight since her last visit.  This is a good sign given the fact that she is experiencing directly cachexia prior to her hospitalization.  Her nerve pain in her legs is improving.  She is only taking Lyrica 25 mg twice daily.  However her insurance has refused all home health physical therapy.  The patient has no family close by to carry her to physical therapy.  She states it is impossible for her to get to physical therapy.  She is sitting today in a wheelchair.  She states that she has a difficult time even standing.  She is becoming progressively more patient the longer time passes.  She does have a home health aide who sits with her for 3 hours every day.  I question if the home health aide could help supervise her doing some basic exercises such as doing stand up and sit down exercises out of the chair to build up her hamstrings and quadriceps, walking short distances with her walker to  build up endurance.  The patient states that her health aide would be glad to that.  Patient reports worsening anxiety and trouble sleeping despite taking mirtazapine and lithium.  She states that she is unable to sleep at night.  She has a history of bipolar disorder. Past Medical History:  Diagnosis Date   Anxiety    Arthritis    Avascular necrosis of hip (HCC)    left   Bipolar 1 disorder (HCC)    Chronic left hip pain    Chronic nausea    Depression    Diverticulosis of colon    Family hx of colon cancer    age 40-father   Folate deficiency    GERD (gastroesophageal reflux disease)    Headache    Heart murmur    Hx of abnormal Pap smear    Hyperlipidemia    Hyperthyroidism    Keratosis, actinic    Low back pain 03/01/2013   MRI with multiple levels of disc bulge   Panic attacks    Polysubstance abuse (HCC)    PTSD (post-traumatic stress disorder)    S/P colonoscopy  05/30/2010   LAX sphincter tone, anal papilla, left-sided diverticulosis, normal random biopsies,, 1 polyp-TA   S/P endoscopy 05/30/2010   Dr Rourk-> non--critical Schatzki's ring, s/p 69F dilation   Seizures (HCC)    Stroke (HCC) 2014   Right parietal, no deficits    Tubular adenoma of colon 05/30/2010   Next colonoscopy 05/2015   UTI (urinary tract infection)    Past Surgical History:  Procedure Laterality Date   COLONOSCOPY     every 5 years   COLONOSCOPY  05/30/2010   RMR:lax anal sphincter tone,anal papilla,otherwise normal/left-sided diverticula   ESOPHAGOGASTRODUODENOSCOPY  05/30/2010   ZOX:[id number] ring/small HH otherwise normal   EXTERNAL EAR SURGERY Left 12 years ago   skin graft from behind ear put in ear canal   JOINT REPLACEMENT     NECK SURGERY  10 years ago   SKIN CANCER DESTRUCTION     TOTAL HIP ARTHROPLASTY Left 04/19/2014   Procedure: LEFT TOTAL HIP ARTHROPLASTY ANTERIOR APPROACH;  Surgeon: Loanne Drilling, MD;  Location: [facility];  Service: Orthopedics;  Laterality: Left;   TOTAL HIP ARTHROPLASTY Right 05/23/2015   Procedure: RIGHT TOTAL HIP ARTHROPLASTY ANTERIOR APPROACH;  Surgeon: Ollen Gross, MD;  Location: [facility];  Service: Orthopedics;  Laterality: Right;   Current Outpatient Medications on File Prior to Visit  Medication Sig Dispense Refill   atorvastatin (LIPITOR) 40 MG tablet TAKE (1) TABLET BY MOUTH ONCE DAILY. (Patient taking differently: Take 10 mg by mouth daily.) 30 tablet 0   feeding supplement (ENSURE ENLIVE / ENSURE PLUS) LIQD Take 237 mLs by mouth 2 (two) times daily between meals. 237 mL 12   levETIRAcetam (KEPPRA) 1000 MG tablet Take 1 tablet (1,000 mg total) by mouth 2 (two) times daily. 60 tablet 3   levothyroxine (SYNTHROID) 50 MCG tablet Take 1 tablet (50 mcg total) by mouth daily. 30 tablet 3   lithium 300 MG tablet Take 1 tablet (300 mg total) by mouth at bedtime. 30 tablet 3   melatonin 3 MG TABS tablet Take 2 tablets (6 mg total) by mouth  at bedtime. 60 tablet 3   mirtazapine (REMERON) 30 MG tablet Take 1 tablet (30 mg total) by mouth at bedtime. 30 tablet 3   Multiple Vitamins-Minerals (ONE-A-DAY WOMENS 50+ PO) Take 1 tablet by mouth daily. (Patient not taking: Reported on 06/05/2023)     omeprazole (  PRILOSEC) 40 MG capsule Take 1 capsule (40 mg total) by mouth daily. 30 capsule 3   pregabalin (LYRICA) 25 MG capsule Take 1 capsule (25 mg total) by mouth 2 (two) times daily. 60 capsule 3   senna (SENOKOT) 8.6 MG tablet Take 2 tablets (17.2 mg total) by mouth daily. 60 tablet 3   UNABLE TO FIND Med Name: BIOFREEZE EXTERNAL GEL 4% (Patient not taking: Reported on 06/05/2023)     [DISCONTINUED] LINZESS 72 MCG capsule TAKE ONE CAPSULE ORALLY EVERY MORNING BEFORE BREAKFAST. (Patient taking differently: Take 72 mcg by mouth daily before breakfast. ) 30 capsule 1   No current facility-administered medications on file prior to visit.   No Known Allergies Social History   Socioeconomic History   Marital status: Divorced    Spouse name: Not on file   Number of children: 1   Years of education: Not on file   Highest education level: Not on file  Occupational History   Occupation: disabled  Tobacco Use   Smoking status: Former    Current packs/day: 0.00    Average packs/day: 0.5 packs/day for 30.0 years (15.0 ttl pk-yrs)    Types: Cigarettes    Start date: 04/24/1992    Quit date: 04/24/2022    Years since quitting: 1.2   Smokeless tobacco: Never   Tobacco comments:    trying to quit  Vaping Use   Vaping status: Never Used  Substance and Sexual Activity   Alcohol use: No    Alcohol/week: 0.0 standard drinks of alcohol   Drug use: No    Comment: hx cocaine abuse   Sexual activity: Never    Birth control/protection: None  Other Topics Concern   Not on file  Social History Narrative   Patient lives at home alone and she is single.    Disabled.   Education college education.   Right handed.   Caffeine mountain dew four  daily.    Social Determinants of Health   Financial Resource Strain: Not on file  Food Insecurity: No Food Insecurity (03/03/2023)   Hunger Vital Sign    Worried About Running Out of Food in the Last Year: Never true    Ran Out of Food in the Last Year: Never true  Transportation Needs: No Transportation Needs (03/03/2023)   PRAPARE - Administrator, Civil Service (Medical): No    Lack of Transportation (Non-Medical): No  Physical Activity: Not on file  Stress: Not on file  Social Connections: Not on file  Intimate Partner Violence: Not At Risk (03/03/2023)   Humiliation, Afraid, Rape, and Kick questionnaire    Fear of Current or Ex-Partner: No    Emotionally Abused: No    Physically Abused: No    Sexually Abused: No     Review of Systems  All other systems reviewed and are negative.      Objective:   Physical Exam Vitals reviewed.  Constitutional:      General: She is not in acute distress.    Appearance: She is well-developed. She is not diaphoretic.  HENT:     Right Ear: Tympanic membrane, ear canal and external ear normal.     Left Ear: Tympanic membrane, ear canal and external ear normal.     Nose: Nose normal.     Mouth/Throat:     Pharynx: No oropharyngeal exudate.  Eyes:     Extraocular Movements: Extraocular movements intact.     Pupils: Pupils are equal, round, and reactive to  light.  Neck:     Thyroid: No thyromegaly.     Vascular: No JVD.  Cardiovascular:     Rate and Rhythm: Normal rate and regular rhythm.     Heart sounds: Normal heart sounds. No murmur heard. Pulmonary:     Effort: Pulmonary effort is normal. No respiratory distress.     Breath sounds: Normal breath sounds. No stridor. No wheezing, rhonchi or rales.  Abdominal:     General: Bowel sounds are normal. There is no distension.     Palpations: Abdomen is soft.     Tenderness: There is no abdominal tenderness. There is no guarding or rebound.  Genitourinary:    Labia:         Right: No rash.        Left: No rash.      Vagina: Normal.     Cervix: Normal.     Uterus: Normal.      Adnexa: Right adnexa normal and left adnexa normal.  Musculoskeletal:     Cervical back: Neck supple.     Right lower leg: No edema.     Left lower leg: No edema.  Lymphadenopathy:     Cervical: No cervical adenopathy.  Skin:    Coloration: Skin is not pale.     Findings: No erythema or rash.  Neurological:     Mental Status: She is alert and oriented to person, place, and time.     Cranial Nerves: No cranial nerve deficit.     Motor: No abnormal muscle tone.     Coordination: Coordination normal.  Psychiatric:        Behavior: Behavior normal.        Thought Content: Thought content normal.        Judgment: Judgment normal.           Assessment & Plan:  B12 deficiency - Plan: cyanocobalamin (VITAMIN B12) injection 1,000 mcg  Other polyneuropathy  Weakness of both lower extremities  Bipolar depression (HCC) Add Rexulti 0.5 mg daily and uptitrate to 1 mg in 1 week.  Hopefully this will help with the patient's anxiety and depression and as a mood stabilizer.  If the patient does better on Rexulti I will likely take her off lithium in the future.  She received her B12 injection today.  Continue Lyrica 25 mg twice daily.  Spent more than 20 minutes today with the patient discussing exercises I want her to perform and how to do these safely at home health of her home health aide.  Also explained the importance of her doing this is progressive deconditioning is going to leave her back in a nursing facility

## 2023-07-09 ENCOUNTER — Other Ambulatory Visit: Payer: Self-pay | Admitting: Family Medicine

## 2023-07-10 NOTE — Telephone Encounter (Signed)
Requested Prescriptions  Refused Prescriptions Disp Refills   omeprazole (PRILOSEC) 20 MG capsule [Pharmacy Med Name: OMEPRAZOLE 20 MG CAPSULE DR] 60 capsule 0    Sig: TAKE 1 CAPSULE BY MOUTH TWICE A DAY FOR GERD     Gastroenterology: Proton Pump Inhibitors Failed - 07/09/2023  4:34 PM      Failed - Valid encounter within last 12 months    Recent Outpatient Visits           1 year ago Other specified hypothyroidism   [facility] Donita Sze, MD   2 years ago Bipolar I disorder [facility])   [facility] Pickard, Priscille Heidelberg, MD   3 years ago Other specified hypothyroidism   [facility] Donita Crossno, MD   4 years ago Chest pain due to gastrointestinal reflux disease   [facility] Donita Neas, MD   5 years ago Hypothyroidism, unspecified type   [facility] Pickard, Priscille Heidelberg, MD

## 2023-07-20 ENCOUNTER — Telehealth: Payer: Self-pay | Admitting: Family Medicine

## 2023-07-20 NOTE — Telephone Encounter (Signed)
Patient called to follow up on new med she's taking (REXULTI); stated she doesn't like the way it makes her feel. Patient stated it makes her feel like a zombie; requesting for provider to change medication.  Please advise at [phone].

## 2023-07-21 ENCOUNTER — Other Ambulatory Visit: Payer: Self-pay | Admitting: Family Medicine

## 2023-07-21 MED ORDER — CLONAZEPAM 0.5 MG PO TABS: 0.5000 mg | ORAL_TABLET | Freq: Two times a day (BID) | ORAL | 1 refills | Status: DC | PRN

## 2023-07-21 NOTE — Telephone Encounter (Signed)
MRI brain Rudolpho Sevin: [id number] exp. 07/21/23-08/20/23  MRI cervical Rudolpho Sevin: [id number] exp. 07/21/23-08/20/23 MRI thoracic Rudolpho Sevin: [id number] exp. 07/21/23-08/20/23 [facility] to [facility] [phone]

## 2023-08-06 ENCOUNTER — Ambulatory Visit: Payer: MEDICAID

## 2023-08-06 ENCOUNTER — Ambulatory Visit (HOSPITAL_COMMUNITY)
Admission: RE | Admit: 2023-08-06 | Discharge: 2023-08-06 | Disposition: A | Discharge status: Home / Self Care | Payer: MEDICAID | Source: Ambulatory Visit | Attending: Neurology | Admitting: Neurology

## 2023-08-06 DIAGNOSIS — R269 Unspecified abnormalities of gait and mobility: Secondary | ICD-10-CM

## 2023-08-06 DIAGNOSIS — Z8673 Personal history of transient ischemic attack (TIA), and cerebral infarction without residual deficits: Secondary | ICD-10-CM | POA: Diagnosis present

## 2023-08-06 DIAGNOSIS — G40209 Localization-related (focal) (partial) symptomatic epilepsy and epileptic syndromes with complex partial seizures, not intractable, without status epilepticus: Secondary | ICD-10-CM | POA: Insufficient documentation

## 2023-08-06 DIAGNOSIS — E538 Deficiency of other specified B group vitamins: Secondary | ICD-10-CM | POA: Diagnosis not present

## 2023-08-06 MED ORDER — CYANOCOBALAMIN 1000 MCG/ML IJ SOLN
1000.0000 ug | Freq: Once | INTRAMUSCULAR | Status: AC
Start: 2023-08-06 — End: 2023-08-06
  Administered 2023-08-06: 1000 ug via INTRAMUSCULAR

## 2023-08-06 NOTE — Progress Notes (Signed)
Pt came in for b12 inj. Inj give on the R-upper arm pt tol well. Pt left in w/c w/friend w/no c/o

## 2023-08-18 ENCOUNTER — Telehealth: Payer: Self-pay | Admitting: Neurology

## 2023-08-18 NOTE — Telephone Encounter (Signed)
Pt states it been about 2 weeks since her MRI, she'd like a call once results have been read by Dr Epimenio Foot

## 2023-08-20 ENCOUNTER — Telehealth: Payer: Self-pay | Admitting: Family Medicine

## 2023-08-20 NOTE — Telephone Encounter (Signed)
Patient called to advise she stopped taking the Klonopin about 5 days ago due to side effects; stated she resumed taking it about 3 days ago and has been experiencing incontinence at night which is one of the listed side effects. Patient stated she's only been taking one (1) pill instead of the prescribed 2 pill dose the entire time she's been taking it.   Patient requesting for provider to put her on Xanax since Rexulti and Klonopin don't work well for her.   Pharmacy confirmed as:  [facility] [place], [place] - [id number], [place] [address] Phone: [phone]  Fax: [phone]   Please advise patient at [phone].

## 2023-08-20 NOTE — Telephone Encounter (Signed)
I called and spoke with the patient to provide her the results of the MRI. I relayed the message as written to the patient. She verbalized understanding of the findings and stated she continues on monthly B12 injections. She expressed appreciation for the call.

## 2023-08-20 NOTE — Telephone Encounter (Signed)
Spoke with patient; stated she's wheel chair bound and doesn't have any way to come to an appointment.   Patient's closest relative lives in [place] and would have to take off of work to bring her.   Patient unsure of what to do.   Please advise at [phone].

## 2023-08-21 ENCOUNTER — Telehealth (INDEPENDENT_AMBULATORY_CARE_PROVIDER_SITE_OTHER): Payer: MEDICAID | Admitting: Family Medicine

## 2023-08-21 DIAGNOSIS — F319 Bipolar disorder, unspecified: Secondary | ICD-10-CM

## 2023-08-21 MED ORDER — LITHIUM CARBONATE 300 MG PO TABS: 300.0000 mg | ORAL_TABLET | Freq: Two times a day (BID) | ORAL | 3 refills | Status: DC

## 2023-08-21 NOTE — Progress Notes (Signed)
Subjective:    Patient ID: Sheryl Hall, female    DOB: 21-Mar-1961, 62 y.o.   MRN: [id number]  HPI Patient is being seen today as a telephone visit.  This was originally scheduled to be a video visit however the patient was having technical difficulties.  I called the patient at 1120.  Patient consents to be seen via telephone.  She is currently at home.  I am currently.  Phone call concluded at 1140.  Patient has a history of bipolar disorder.  When I last saw the patient I started her on Vraylar for depression and as a mood stabilizer.  The patient is also taking lithium 300 mg p.o. nightly and Remeron 30 mg p.o. nightly as an appetite stimulant.  The patient discontinued Vraylar.  She has stopped multiple medications in the past because of the "way they make her feel".  I did give her Klonopin to help with her anxiety.  She called complaining of the Klonopin was making her feel bad as well and wanted to be switched to Xanax.  When I spoke with the patient, she reports feeling agitated.  She reports racing thoughts.  She denies any insomnia.  She is still sleeping.  She denies any hallucinations or delusions.  She denies any suicidal thoughts.  Instead she reports increased agitation, feeling more upset more easily, and racing thoughts and trouble concentrating. Past Medical History:  Diagnosis Date   Anxiety    Arthritis    Avascular necrosis of hip (HCC)    left   Bipolar 1 disorder (HCC)    Chronic left hip pain    Chronic nausea    Depression    Diverticulosis of colon    Family hx of colon cancer    age 71-father   Folate deficiency    GERD (gastroesophageal reflux disease)    Headache    Heart murmur    Hx of abnormal Pap smear    Hyperlipidemia    Hyperthyroidism    Keratosis, actinic    Low back pain 03/01/2013   MRI with multiple levels of disc bulge   Panic attacks    Polysubstance abuse (HCC)    PTSD (post-traumatic stress disorder)    S/P colonoscopy 05/30/2010   LAX  sphincter tone, anal papilla, left-sided diverticulosis, normal random biopsies,, 1 polyp-TA   S/P endoscopy 05/30/2010   Dr Rourk-> non--critical Schatzki's ring, s/p 43F dilation   Seizures (HCC)    Stroke (HCC) 2014   Right parietal, no deficits    Tubular adenoma of colon 05/30/2010   Next colonoscopy 05/2015   UTI (urinary tract infection)    Past Surgical History:  Procedure Laterality Date   COLONOSCOPY     every 5 years   COLONOSCOPY  05/30/2010   RMR:lax anal sphincter tone,anal papilla,otherwise normal/left-sided diverticula   ESOPHAGOGASTRODUODENOSCOPY  05/30/2010   YQI:[id number] ring/small HH otherwise normal   EXTERNAL EAR SURGERY Left 12 years ago   skin graft from behind ear put in ear canal   JOINT REPLACEMENT     NECK SURGERY  10 years ago   SKIN CANCER DESTRUCTION     TOTAL HIP ARTHROPLASTY Left 04/19/2014   Procedure: LEFT TOTAL HIP ARTHROPLASTY ANTERIOR APPROACH;  Surgeon: Loanne Drilling, MD;  Location: [facility];  Service: Orthopedics;  Laterality: Left;   TOTAL HIP ARTHROPLASTY Right 05/23/2015   Procedure: RIGHT TOTAL HIP ARTHROPLASTY ANTERIOR APPROACH;  Surgeon: Ollen Gross, MD;  Location: [facility];  Service: Orthopedics;  Laterality: Right;  Current Outpatient Medications on File Prior to Visit  Medication Sig Dispense Refill   atorvastatin (LIPITOR) 40 MG tablet TAKE (1) TABLET BY MOUTH ONCE DAILY. (Patient taking differently: Take 10 mg by mouth daily.) 30 tablet 0   clonazePAM (KLONOPIN) 0.5 MG tablet Take 1 tablet (0.5 mg total) by mouth 2 (two) times daily as needed for anxiety. 60 tablet 1   feeding supplement (ENSURE ENLIVE / ENSURE PLUS) LIQD Take 237 mLs by mouth 2 (two) times daily between meals. 237 mL 12   levETIRAcetam (KEPPRA) 1000 MG tablet Take 1 tablet (1,000 mg total) by mouth 2 (two) times daily. 60 tablet 3   levothyroxine (SYNTHROID) 50 MCG tablet Take 1 tablet (50 mcg total) by mouth daily. 30 tablet 3   melatonin 3 MG TABS tablet Take 2  tablets (6 mg total) by mouth at bedtime. 60 tablet 3   mirtazapine (REMERON) 30 MG tablet Take 1 tablet (30 mg total) by mouth at bedtime. 30 tablet 3   Multiple Vitamins-Minerals (ONE-A-DAY WOMENS 50+ PO) Take 1 tablet by mouth daily.     omeprazole (PRILOSEC) 40 MG capsule Take 1 capsule (40 mg total) by mouth daily. 30 capsule 3   pregabalin (LYRICA) 25 MG capsule Take 1 capsule (25 mg total) by mouth 2 (two) times daily. 60 capsule 3   senna (SENOKOT) 8.6 MG tablet Take 2 tablets (17.2 mg total) by mouth daily. 60 tablet 3   UNABLE TO FIND Med Name: BIOFREEZE EXTERNAL GEL 4%     [DISCONTINUED] LINZESS 72 MCG capsule TAKE ONE CAPSULE ORALLY EVERY MORNING BEFORE BREAKFAST. (Patient taking differently: Take 72 mcg by mouth daily before breakfast. ) 30 capsule 1   No current facility-administered medications on file prior to visit.   No Known Allergies Social History   Socioeconomic History   Marital status: Divorced    Spouse name: Not on file   Number of children: 1   Years of education: Not on file   Highest education level: Not on file  Occupational History   Occupation: disabled  Tobacco Use   Smoking status: Former    Current packs/day: 0.00    Average packs/day: 0.5 packs/day for 30.0 years (15.0 ttl pk-yrs)    Types: Cigarettes    Start date: 04/24/1992    Quit date: 04/24/2022    Years since quitting: 1.3   Smokeless tobacco: Never   Tobacco comments:    trying to quit  Vaping Use   Vaping status: Never Used  Substance and Sexual Activity   Alcohol use: No    Alcohol/week: 0.0 standard drinks of alcohol   Drug use: No    Comment: hx cocaine abuse   Sexual activity: Never    Birth control/protection: None  Other Topics Concern   Not on file  Social History Narrative   Patient lives at home alone and she is single.    Disabled.   Education college education.   Right handed.   Caffeine mountain dew four daily.    Social Determinants of Health   Financial  Resource Strain: Not on file  Food Insecurity: No Food Insecurity (03/03/2023)   Hunger Vital Sign    Worried About Running Out of Food in the Last Year: Never true    Ran Out of Food in the Last Year: Never true  Transportation Needs: No Transportation Needs (03/03/2023)   PRAPARE - Administrator, Civil Service (Medical): No    Lack of Transportation (Non-Medical): No  Physical Activity: Not on file  Stress: Not on file  Social Connections: Not on file  Intimate Partner Violence: Not At Risk (03/03/2023)   Humiliation, Afraid, Rape, and Kick questionnaire    Fear of Current or Ex-Partner: No    Emotionally Abused: No    Physically Abused: No    Sexually Abused: No      Review of Systems  Psychiatric/Behavioral:  Positive for decreased concentration and dysphoric mood. Negative for behavioral problems, confusion, hallucinations, self-injury, sleep disturbance and suicidal ideas. The patient is nervous/anxious and is hyperactive.        Objective:   Physical Exam        Assessment & Plan:  Bipolar depression (HCC) I recommended that we increase the lithium to 300 mg twice daily and reassess next week to see how she is doing.  May increase up to 900 mg total if necessary.  Will need to monitor her lithium level if we increase beyond 600 mg total a day.  Will not make any changes to the Klonopin at the present time or add Xanax.

## 2023-08-28 ENCOUNTER — Other Ambulatory Visit: Payer: Self-pay | Admitting: Family Medicine

## 2023-08-28 ENCOUNTER — Telehealth: Payer: Self-pay

## 2023-08-28 DIAGNOSIS — Z1211 Encounter for screening for malignant neoplasm of colon: Secondary | ICD-10-CM

## 2023-08-28 DIAGNOSIS — Z1212 Encounter for screening for malignant neoplasm of rectum: Secondary | ICD-10-CM

## 2023-08-28 NOTE — Telephone Encounter (Signed)
Pt called in wanting to speak to nurse/pcp about changing meds/or stopping of a med that she has been having issues with urine incontinence especially at night. Pt stated that pcp would call in another med for pt. Pt would like to get a call back from nurse please.   CB#: [phone]

## 2023-09-07 ENCOUNTER — Ambulatory Visit: Payer: MEDICAID

## 2023-09-07 DIAGNOSIS — E538 Deficiency of other specified B group vitamins: Secondary | ICD-10-CM

## 2023-09-07 DIAGNOSIS — R32 Unspecified urinary incontinence: Secondary | ICD-10-CM

## 2023-09-07 MED ORDER — CYANOCOBALAMIN 1000 MCG/ML IJ SOLN
1000.0000 ug | Freq: Once | INTRAMUSCULAR | Status: AC
Start: 2023-09-07 — End: 2023-09-07
  Administered 2023-09-07: 1000 ug via INTRAMUSCULAR

## 2023-09-07 NOTE — Progress Notes (Unsigned)
Pt here for B12 injection. Injection given in L deltoid as ordered. Pt tolerated well. Mjp,lpn

## 2023-09-08 ENCOUNTER — Other Ambulatory Visit: Payer: Self-pay | Admitting: Family Medicine

## 2023-09-08 LAB — URINALYSIS, ROUTINE W REFLEX MICROSCOPIC
Bacteria, UA: NONE SEEN /[HPF]
Bilirubin Urine: NEGATIVE
Glucose, UA: NEGATIVE
Hgb urine dipstick: NEGATIVE
Ketones, ur: NEGATIVE
Nitrite: NEGATIVE
RBC / HPF: NONE SEEN /[HPF] (ref 0–2)
Specific Gravity, Urine: 1.023 (ref 1.001–1.035)
pH: 5.5 (ref 5.0–8.0)

## 2023-09-08 LAB — MICROSCOPIC MESSAGE

## 2023-09-08 LAB — URINE CULTURE
MICRO NUMBER:: 15590603
SPECIMEN QUALITY:: ADEQUATE

## 2023-09-08 MED ORDER — CEPHALEXIN 500 MG PO CAPS: 500.0000 mg | ORAL_CAPSULE | Freq: Three times a day (TID) | ORAL | 0 refills | Status: DC

## 2023-09-11 ENCOUNTER — Telehealth: Payer: Self-pay

## 2023-09-11 NOTE — Telephone Encounter (Signed)
Spoke with pt. Pt states she is having swelling and tightness to both legs today, from thigh down. Pt states they are not hot to touch and one leg is not larger than the other and no SOB.  I advised pt she should probably go to the ER but she states, "I don't have any way to go today." Is there anything else you can recommend for her?

## 2023-09-11 NOTE — Telephone Encounter (Signed)
Pt called in stating that both legs are feeling really tight. Pt would like a cb from nurse please.   Cb#: [phone]

## 2023-09-13 ENCOUNTER — Other Ambulatory Visit: Payer: Self-pay | Admitting: Family Medicine

## 2023-09-14 ENCOUNTER — Telehealth: Payer: Self-pay | Admitting: Family Medicine

## 2023-09-14 ENCOUNTER — Telehealth: Payer: Self-pay

## 2023-09-14 NOTE — Telephone Encounter (Signed)
Requested medications are due for refill today.  Due 09/20/2023  Requested medications are on the active medications list.  yes  Last refill. 07/21/2023 #60 1 rf  Future visit scheduled.   yes  Notes to clinic.  Refill not delegated.    Requested Prescriptions  Pending Prescriptions Disp Refills   clonazePAM (KLONOPIN) 0.5 MG tablet [Pharmacy Med Name: clonazepam 0.5 mg tablet] 60 tablet 1    Sig: TAKE (1) TABLET BY MOUTH TWICE DAILY AS NEEDED FOR ANXIETY.     Not Delegated - Psychiatry: Anxiolytics/Hypnotics 2 Failed - 09/13/2023  8:02 AM      Failed - This refill cannot be delegated      Failed - Valid encounter within last 6 months    Recent Outpatient Visits           2 years ago Other specified hypothyroidism   [facility] Tanya Nones, Priscille Heidelberg, MD   2 years ago Bipolar I disorder [facility])   [facility] Pickard, Priscille Heidelberg, MD   3 years ago Other specified hypothyroidism   [facility] Donita Auzenne, MD   4 years ago Chest pain due to gastrointestinal reflux disease   [facility] Donita Wellen, MD   5 years ago Hypothyroidism, unspecified type   [facility] Pickard, Priscille Heidelberg, MD       Future Appointments             In 1 week Pickard, Priscille Heidelberg, MD [facility]            Passed - Urine Drug Screen completed in last 360 days      Passed - Patient is not pregnant

## 2023-09-14 NOTE — Telephone Encounter (Signed)
Patient mailed two-pages (pages 2 of 3, and 3 of 3) of an application to [employer] for Children/Disabled Adults for provider to complete and sign.   Form placed on nurse's desk.   Please advise patient when form completed at [phone].

## 2023-09-14 NOTE — Telephone Encounter (Signed)
Pt called in to check on status of ppw that was mailed to pcp. Paperwork was pertaining to pt applying for CAP  Program. Pt stated that she needs for pcp to complete provider's section and for nurse to mail/or fax form to number or address provided on ppw please. Please advise  Cb#: [phone]

## 2023-09-15 ENCOUNTER — Encounter: Payer: Self-pay | Admitting: Family Medicine

## 2023-09-15 ENCOUNTER — Ambulatory Visit: Payer: MEDICAID | Admitting: Family Medicine

## 2023-09-16 NOTE — Telephone Encounter (Signed)
Forms completed and signed by provider; No charge. Successfully faxed to [facility] Admin with a confirmation time stamp of 09-16-2023 13:39  Outbound call placed to advise patient; no answer or voicemail.

## 2023-09-18 ENCOUNTER — Ambulatory Visit: Payer: Self-pay

## 2023-09-18 ENCOUNTER — Telehealth: Payer: Self-pay

## 2023-09-18 NOTE — Patient Instructions (Signed)
Visit Information  Thank you for taking time to visit with me today. Please don't hesitate to contact me if I can be of assistance to you.   Following are the goals we discussed today:  Patient to contact DSS to complete Medicaid transportation assessment for on-going services.    If you are experiencing a Mental Health or Behavioral Health Crisis or need someone to talk to, please call 911  Patient verbalizes understanding of instructions and care plan provided today and agrees to view in MyChart. Active MyChart status and patient understanding of how to access instructions and care plan via MyChart confirmed with patient.     No further follow up required: Patient does not request a follow up visit.

## 2023-09-18 NOTE — Telephone Encounter (Signed)
Pt called in stating that she has taken all of the antibiotic that pcp has prescribed and still having issues with urination. Pt would like to know if pcp would send in refill of that med to see if that will help. Pt is scheduled for an OV on 09/25/23, but would like to know if this refill could be sent in before then please. Pt would like a cb from nurse when and if this med could be sent in . Please advise.  Cb#: [phone]

## 2023-09-18 NOTE — Patient Outreach (Signed)
  Care Coordination   Initial Visit Note   09/18/2023 Name: Sheryl Hall MRN: [id number] DOB: [birth year]  Sheryl Hall is a 62 y.o. year old female who sees Pickard, Priscille Heidelberg, MD for primary care. I spoke with  Linward Foster by phone today.  What matters to the patients health and wellness today?  Patient needs transportation. Patient has submitted CAP paperwork and hopes to increase her PCS hours. Aide drives her car to transport patient to appointments. Patient feels that she can connect with DSS and does not request a follow up visit.    Goals Addressed             This Visit's Progress    Transportation       Interventions Today    Flowsheet Row Most Recent Value  Chronic Disease   Chronic disease during today's visit Other  [Stroke]  General Interventions   General Interventions Discussed/Reviewed General Interventions Discussed, General Interventions Reviewed, [employer] has missed apts when her aide is not able to transport. Pt has Medicaid but has not used transportation. SW provides number to DSS to call for [employer]. Pt will call today to complete assessment. Aide will transport to 11/01 apt.]              SDOH assessments and interventions completed:  Yes  SDOH Interventions Today    Flowsheet Row Most Recent Value  SDOH Interventions   Food Insecurity Interventions Intervention Not Indicated, Other (Comment)  [Has foodstamps]  Housing Interventions Intervention Not Indicated  Transportation Interventions Intervention Not Indicated, Other (Comment)  [Aide helps with appointments and errands]  Utilities Interventions Intervention Not Indicated        Care Coordination Interventions:  Yes, provided   Follow up plan: No further intervention required.   Encounter Outcome:  Patient Visit Completed

## 2023-09-21 ENCOUNTER — Ambulatory Visit: Payer: MEDICAID | Admitting: Family Medicine

## 2023-09-21 NOTE — Telephone Encounter (Signed)
Attempted to call pt however, no answered and no VM. Will try again later.

## 2023-09-22 ENCOUNTER — Telehealth: Payer: Self-pay

## 2023-09-22 NOTE — Telephone Encounter (Signed)
Pt called in stating that she looked at the side effects of this med lithium 300 MG tablet and thinks that this may be the cause of her frequent urination and leg tightening issues. Pt stated that she would like to know if pcp would change this med to this med LEXAPRO 10 MG. Pt stated that she tolerate the lexapro better. Please advise.  Pt has an upcoming appt 09/25/23  Cb#: [phone]

## 2023-09-23 ENCOUNTER — Ambulatory Visit: Payer: MEDICAID | Admitting: Neurology

## 2023-09-25 ENCOUNTER — Ambulatory Visit (INDEPENDENT_AMBULATORY_CARE_PROVIDER_SITE_OTHER): Payer: MEDICAID | Admitting: Family Medicine

## 2023-09-25 ENCOUNTER — Encounter: Payer: Self-pay | Admitting: Family Medicine

## 2023-09-25 VITALS — BP 126/72 | HR 88 | Temp 98.7°F | Ht 66.0 in | Wt 187.0 lb

## 2023-09-25 DIAGNOSIS — F319 Bipolar disorder, unspecified: Secondary | ICD-10-CM | POA: Diagnosis not present

## 2023-09-25 DIAGNOSIS — R29898 Other symptoms and signs involving the musculoskeletal system: Secondary | ICD-10-CM | POA: Diagnosis not present

## 2023-09-25 MED ORDER — CLONAZEPAM 0.5 MG PO TABS: 0.5000 mg | ORAL_TABLET | Freq: Two times a day (BID) | ORAL | 1 refills | Status: DC | PRN

## 2023-09-25 MED ORDER — CAPLYTA 21 MG PO CAPS: 1.0000 | ORAL_CAPSULE | Freq: Every day | ORAL | 1 refills | Status: DC

## 2023-09-25 NOTE — Progress Notes (Signed)
Subjective:    Patient ID: Sheryl Hall, female    DOB: 1961-08-27, 62 y.o.   MRN: [id number]  HPI  Patient has a history of bipolar disorder.  When I last saw the patient I started her on Vraylar for depression and as a mood stabilizer.  The patient is also taking lithium 300 mg p.o. nightly and Remeron 30 mg p.o. nightly as an appetite stimulant.  The patient discontinued Vraylar.  She has stopped multiple medications in the past because of the "way they make her feel".  I did give her Klonopin to help with her anxiety.  She called complaining of the Klonopin was making her feel bad as well and wanted to be switched to Xanax.  Recently hide increase lithium to 300 mg twice daily but the patient developed polyuria and therefore does not want to take this any longer.  However today, she reports feeling tearful.  She admits to having suicidal thoughts.  She reports feeling depressed.  She is requesting additional hours for her home health aide.  Currently she is getting 2 hours a day 5 days a week.  This person is helping to wash her hair, prepare her meals, cleaning her house, etc.  I do not see that the patient requires more hours than 2 a day.  She is currently not going to any physical therapy will require transportation.  I believe 2 hours a day is sufficient time to clean her home and help with bathing especially when she gets 10 hours a week.  I explained this to the patient.  Unfortunately I believe this upset her. Past Medical History:  Diagnosis Date   Anxiety    Arthritis    Avascular necrosis of hip (HCC)    left   Bipolar 1 disorder (HCC)    Chronic left hip pain    Chronic nausea    Depression    Diverticulosis of colon    Family hx of colon cancer    age 62-father   Folate deficiency    GERD (gastroesophageal reflux disease)    Headache    Heart murmur    Hx of abnormal Pap smear    Hyperlipidemia    Hypothyroidism    Keratosis, actinic    Low back pain 03/01/2013   MRI  with multiple levels of disc bulge   Panic attacks    Polysubstance abuse (HCC)    PTSD (post-traumatic stress disorder)    S/P colonoscopy 05/30/2010   LAX sphincter tone, anal papilla, left-sided diverticulosis, normal random biopsies,, 1 polyp-TA   S/P endoscopy 05/30/2010   Dr Rourk-> non--critical Schatzki's ring, s/p 103F dilation   Seizures (HCC)    Stroke (HCC) 2014   Right parietal, no deficits    Tubular adenoma of colon 05/30/2010   Next colonoscopy 05/2015   UTI (urinary tract infection)    Past Surgical History:  Procedure Laterality Date   COLONOSCOPY     every 5 years   COLONOSCOPY  05/30/2010   RMR:lax anal sphincter tone,anal papilla,otherwise normal/left-sided diverticula   ESOPHAGOGASTRODUODENOSCOPY  05/30/2010   YSA:[id number] ring/small HH otherwise normal   EXTERNAL EAR SURGERY Left 12 years ago   skin graft from behind ear put in ear canal   JOINT REPLACEMENT     NECK SURGERY  10 years ago   SKIN CANCER DESTRUCTION     TOTAL HIP ARTHROPLASTY Left 04/19/2014   Procedure: LEFT TOTAL HIP ARTHROPLASTY ANTERIOR APPROACH;  Surgeon: Loanne Drilling, MD;  Location:  [facility];  Service: Orthopedics;  Laterality: Left;   TOTAL HIP ARTHROPLASTY Right 05/23/2015   Procedure: RIGHT TOTAL HIP ARTHROPLASTY ANTERIOR APPROACH;  Surgeon: Ollen Gross, MD;  Location: [facility];  Service: Orthopedics;  Laterality: Right;   Current Outpatient Medications on File Prior to Visit  Medication Sig Dispense Refill   levETIRAcetam (KEPPRA) 1000 MG tablet Take 1 tablet (1,000 mg total) by mouth 2 (two) times daily. 60 tablet 3   levothyroxine (SYNTHROID) 50 MCG tablet Take 1 tablet (50 mcg total) by mouth daily. 30 tablet 3   lithium 300 MG tablet Take 1 tablet (300 mg total) by mouth in the morning and at bedtime. 60 tablet 3   melatonin 3 MG TABS tablet Take 2 tablets (6 mg total) by mouth at bedtime. 60 tablet 3   mirtazapine (REMERON) 30 MG tablet Take 1 tablet (30 mg total) by mouth  at bedtime. 30 tablet 3   Multiple Vitamins-Minerals (ONE-A-DAY WOMENS 50+ PO) Take 1 tablet by mouth daily.     omeprazole (PRILOSEC) 40 MG capsule Take 1 capsule (40 mg total) by mouth daily. 30 capsule 3   pregabalin (LYRICA) 25 MG capsule Take 1 capsule (25 mg total) by mouth 2 (two) times daily. 60 capsule 3   senna (SENOKOT) 8.6 MG tablet Take 2 tablets (17.2 mg total) by mouth daily. 60 tablet 3   UNABLE TO FIND Med Name: BIOFREEZE EXTERNAL GEL 4%     atorvastatin (LIPITOR) 40 MG tablet TAKE (1) TABLET BY MOUTH ONCE DAILY. (Patient not taking: Reported on 09/25/2023) 30 tablet 0   cephALEXin (KEFLEX) 500 MG capsule Take 1 capsule (500 mg total) by mouth 3 (three) times daily. (Patient not taking: Reported on 09/25/2023) 21 capsule 0   feeding supplement (ENSURE ENLIVE / ENSURE PLUS) LIQD Take 237 mLs by mouth 2 (two) times daily between meals. (Patient not taking: Reported on 09/25/2023) 237 mL 12   [DISCONTINUED] LINZESS 72 MCG capsule TAKE ONE CAPSULE ORALLY EVERY MORNING BEFORE BREAKFAST. (Patient taking differently: Take 72 mcg by mouth daily before breakfast. ) 30 capsule 1   No current facility-administered medications on file prior to visit.   No Known Allergies Social History   Socioeconomic History   Marital status: Divorced    Spouse name: Not on file   Number of children: 1   Years of education: Not on file   Highest education level: Not on file  Occupational History   Occupation: disabled  Tobacco Use   Smoking status: Former    Current packs/day: 0.00    Average packs/day: 0.5 packs/day for 30.0 years (15.0 ttl pk-yrs)    Types: Cigarettes    Start date: 04/24/1992    Quit date: 04/24/2022    Years since quitting: 1.4   Smokeless tobacco: Never   Tobacco comments:    trying to quit  Vaping Use   Vaping status: Never Used  Substance and Sexual Activity   Alcohol use: No    Alcohol/week: 0.0 standard drinks of alcohol   Drug use: No    Comment: hx cocaine abuse    Sexual activity: Never    Birth control/protection: None  Other Topics Concern   Not on file  Social History Narrative   Patient lives at home alone and she is single.    Disabled.   Education college education.   Right handed.   Caffeine mountain dew four daily.    Social Determinants of Health   Financial Resource Strain: Not on file  Food Insecurity: No Food Insecurity (09/18/2023)   Hunger Vital Sign    Worried About Running Out of Food in the Last Year: Never true    Ran Out of Food in the Last Year: Never true  Transportation Needs: Unmet Transportation Needs (09/18/2023)   PRAPARE - Administrator, Civil Service (Medical): Yes    Lack of Transportation (Non-Medical): Yes  Physical Activity: Not on file  Stress: Not on file  Social Connections: Not on file  Intimate Partner Violence: Not At Risk (03/03/2023)   Humiliation, Afraid, Rape, and Kick questionnaire    Fear of Current or Ex-Partner: No    Emotionally Abused: No    Physically Abused: No    Sexually Abused: No      Review of Systems  Psychiatric/Behavioral:  Positive for decreased concentration and dysphoric mood. Negative for behavioral problems, confusion, hallucinations, self-injury, sleep disturbance and suicidal ideas. The patient is nervous/anxious and is hyperactive.        Objective:   Physical Exam Constitutional:      Appearance: She is obese.  Cardiovascular:     Rate and Rhythm: Normal rate and regular rhythm.     Pulses: Normal pulses.     Heart sounds: Normal heart sounds.  Pulmonary:     Effort: Pulmonary effort is normal.     Breath sounds: Normal breath sounds.  Neurological:     Mental Status: She is alert and oriented to person, place, and time.  Psychiatric:        Behavior: Behavior normal.        Thought Content: Thought content normal.        Judgment: Judgment normal.           Assessment & Plan:  Bipolar depression (HCC) Patient has tried and failed  numerous medications including Vraylar, Latuda, Abilify, lithium.  She has seen several psychiatrist in the past.  I spent more than 25 minutes today with the patient explaining the situation.  She needs to be on a mood stabilizer and give the medication time to work in order to treat her bipolar disorder.  Therefore we have agreed to try Caplyta 1 mg a day and reassess in a month.  She can discontinue lithium if she takes the Caplyta.  Discontinue Remeron as the patient is upset by the weight gain.  Continue Klonopin at the present time.  I apologized to the patient but I do not see a medical indication to require more than 2 hours a day to perform bathing and light cleaning at her home.  Patient states that she needs the assistance because of her neuropathy which keeps her from being able to stand or walk.  I believe she would benefit dramatically physical therapy.  Therefore I will order outpatient physical therapy

## 2023-09-30 ENCOUNTER — Telehealth: Payer: Self-pay | Admitting: Family Medicine

## 2023-10-02 ENCOUNTER — Telehealth: Payer: Self-pay

## 2023-10-02 NOTE — Telephone Encounter (Signed)
Copied from CRM [id number]. Topic: Clinical - Medication Question >> Oct 02, 2023 10:34 AM Mosetta Putt H wrote: Reason for CRM: patient is calling in regarding Caplyta, patient is having cramps in legs, when she stands up and try to get to physical therapy been taking it for a week was on lithium before but with this medication the pain is worse.

## 2023-10-04 ENCOUNTER — Other Ambulatory Visit: Payer: Self-pay | Admitting: Family Medicine

## 2023-10-04 DIAGNOSIS — F319 Bipolar disorder, unspecified: Secondary | ICD-10-CM

## 2023-10-04 DIAGNOSIS — G40209 Localization-related (focal) (partial) symptomatic epilepsy and epileptic syndromes with complex partial seizures, not intractable, without status epilepticus: Secondary | ICD-10-CM

## 2023-10-04 DIAGNOSIS — G6289 Other specified polyneuropathies: Secondary | ICD-10-CM

## 2023-10-04 DIAGNOSIS — I639 Cerebral infarction, unspecified: Secondary | ICD-10-CM

## 2023-10-05 ENCOUNTER — Ambulatory Visit: Payer: Self-pay | Admitting: Family Medicine

## 2023-10-05 ENCOUNTER — Telehealth: Payer: Self-pay

## 2023-10-05 ENCOUNTER — Emergency Department (HOSPITAL_COMMUNITY)
Admission: EM | Admit: 2023-10-05 | Discharge: 2023-10-05 | Disposition: A | Discharge status: Home / Self Care | Payer: MEDICAID | Attending: Emergency Medicine | Admitting: Emergency Medicine

## 2023-10-05 ENCOUNTER — Other Ambulatory Visit: Payer: Self-pay

## 2023-10-05 DIAGNOSIS — E039 Hypothyroidism, unspecified: Secondary | ICD-10-CM | POA: Insufficient documentation

## 2023-10-05 DIAGNOSIS — Z79899 Other long term (current) drug therapy: Secondary | ICD-10-CM | POA: Insufficient documentation

## 2023-10-05 DIAGNOSIS — M7989 Other specified soft tissue disorders: Secondary | ICD-10-CM | POA: Diagnosis not present

## 2023-10-05 DIAGNOSIS — R600 Localized edema: Secondary | ICD-10-CM | POA: Insufficient documentation

## 2023-10-05 LAB — CBC WITH DIFFERENTIAL/PLATELET
Abs Immature Granulocytes: 0.01 10*3/uL (ref 0.00–0.07)
Basophils Absolute: 0 10*3/uL (ref 0.0–0.1)
Basophils Relative: 1 %
Eosinophils Absolute: 0.1 10*3/uL (ref 0.0–0.5)
Eosinophils Relative: 2 %
HCT: 39.3 % (ref 36.0–46.0)
Hemoglobin: 12.1 g/dL (ref 12.0–15.0)
Immature Granulocytes: 0 %
Lymphocytes Relative: 28 %
Lymphs Abs: 1.6 10*3/uL (ref 0.7–4.0)
MCH: 27.1 pg (ref 26.0–34.0)
MCHC: 30.8 g/dL (ref 30.0–36.0)
MCV: 87.9 fL (ref 80.0–100.0)
Monocytes Absolute: 0.5 10*3/uL (ref 0.1–1.0)
Monocytes Relative: 8 %
Neutro Abs: 3.6 10*3/uL (ref 1.7–7.7)
Neutrophils Relative %: 61 %
Platelets: 234 10*3/uL (ref 150–400)
RBC: 4.47 MIL/uL (ref 3.87–5.11)
RDW: 14.9 % (ref 11.5–15.5)
WBC: 5.8 10*3/uL (ref 4.0–10.5)
nRBC: 0 % (ref 0.0–0.2)

## 2023-10-05 LAB — BASIC METABOLIC PANEL
Anion gap: 10 (ref 5–15)
BUN: 7 mg/dL — ABNORMAL LOW (ref 8–23)
CO2: 24 mmol/L (ref 22–32)
Calcium: 8.8 mg/dL — ABNORMAL LOW (ref 8.9–10.3)
Chloride: 105 mmol/L (ref 98–111)
Creatinine, Ser: 0.86 mg/dL (ref 0.44–1.00)
GFR, Estimated: >60 mL/min (ref >60)
Glucose, Bld: 121 mg/dL — ABNORMAL HIGH (ref 70–99)
Potassium: 3.8 mmol/L (ref 3.5–5.1)
Sodium: 139 mmol/L (ref 135–145)

## 2023-10-05 LAB — D-DIMER, QUANTITATIVE: D-Dimer, Quant: 0.58 ug{FEU}/mL — ABNORMAL HIGH (ref 0.00–0.50)

## 2023-10-05 LAB — BRAIN NATRIURETIC PEPTIDE: B Natriuretic Peptide: 31 pg/mL (ref 0.0–100.0)

## 2023-10-05 MED ORDER — FUROSEMIDE 20 MG PO TABS: 20.0000 mg | ORAL_TABLET | Freq: Every day | ORAL | 0 refills | Status: DC | PRN

## 2023-10-05 NOTE — ED Provider Triage Note (Signed)
Emergency Medicine Provider Triage Evaluation Note  Sheryl Hall , a 62 y.o. female  was evaluated in triage.  Pt complains of a 1 week history of bilateral swelling in her lower extremities, improves with elevation, she is currently wheelchair-bound secondary to profound peripheral neuropathy of unclear etiology, although she does report a prior history of problems with B12 deficiency.  Recently saw her primary MD about this complaint, apparently her exam was reassuring at that time.  She describes a pressure sensation in her legs from her knees through her feet, equal bilaterally.  No history of DVT, no recent injury, she is sedentary per above.  Review of Systems  Positive: Leg swelling Negative: Redness, shortness of breath, fever, injury  Physical Exam  BP 131/73 (BP Location: Right Arm)   Pulse 95   Temp 98.4 F (36.9 C) (Oral)   Resp 18   Ht 5\' 6"  (1.676 m)   Wt 77.1 kg   SpO2 98%   BMI 27.44 kg/m  Gen:   Awake, no distress   Resp:  Normal effort  MSK:   Moves extremities without difficulty  Other:  Patient has no edema of her bilateral lower extremities, she has normal pedal pulses, no induration, erythema, dry skin. Medical Decision Making  Medically screening exam initiated at 2:13 PM.  Appropriate orders placed.  Sheryl Hall was informed that the remainder of the evaluation will be completed by another provider, this initial triage assessment does not replace that evaluation, and the importance of remaining in the ED until their evaluation is complete.     Burgess Amor, PA-C 10/05/23 1416

## 2023-10-05 NOTE — ED Provider Notes (Signed)
[place] EMERGENCY DEPARTMENT AT [facility] Provider Note   CSN: [id number] Arrival date & time: 10/05/23  1247     History  Chief Complaint  Patient presents with   Leg Swelling    Sheryl Hall is a 62 y.o. female with history including GERD, history of vitamin B deficiency with resultant peripheral neuropathy, history of bipolar disorder, substance abuse, CVA, heart murmur, seizure disorder and hypothyroidism presenting for evaluation of bilateral lower extremity pain and intermittent swelling, worse during the day better with she elevates her legs.  She endorses having significant swelling on presentation, although I do not appreciate this on exam.  She describes a tight sensation in her bilateral calf muscles.  She denies any recent injuries or overuse, she is currently nonambulatory given her neuropathy therefore is quite sedentary.  She denies chest pain, shortness of breath, fevers, chills.      The history is provided by the patient.       Home Medications Prior to Admission medications   Medication Sig Start Date End Date Taking? Authorizing Provider  clonazePAM (KLONOPIN) 0.5 MG tablet Take 1 tablet (0.5 mg total) by mouth 2 (two) times daily as needed for anxiety. 09/25/23  Yes Donita Bentler, MD  furosemide (LASIX) 20 MG tablet Take 1 tablet (20 mg total) by mouth daily as needed for edema. 10/05/23  Yes Jackalynn Art, Raynelle Fanning, PA-C  levETIRAcetam (KEPPRA) 1000 MG tablet Take 1 tablet (1,000 mg total) by mouth 2 (two) times daily. 06/05/23  Yes Donita Hoh, MD  levothyroxine (SYNTHROID) 50 MCG tablet Take 1 tablet (50 mcg total) by mouth daily. 06/05/23  Yes Donita Kemple, MD  Lumateperone Tosylate (CAPLYTA) 21 MG CAPS Take 1 capsule by mouth daily. Stop lithium 09/25/23  Yes Donita Lingafelter, MD  melatonin 3 MG TABS tablet Take 2 tablets (6 mg total) by mouth at bedtime. Patient taking differently: Take 3 mg by mouth at bedtime. 06/05/23  Yes Donita Saye, MD  omeprazole (PRILOSEC) 40 MG capsule Take 1 capsule (40 mg total) by mouth daily. 06/05/23  Yes Donita Picinich, MD  pregabalin (LYRICA) 25 MG capsule Take 1 capsule (25 mg total) by mouth 2 (two) times daily. 07/02/23  Yes Donita Wakeman, MD  senna (SENOKOT) 8.6 MG tablet Take 2 tablets (17.2 mg total) by mouth daily. 06/05/23  Yes Pickard, Priscille Heidelberg, MD  LINZESS 72 MCG capsule TAKE ONE CAPSULE ORALLY EVERY MORNING BEFORE BREAKFAST. Patient taking differently: Take 72 mcg by mouth daily before breakfast.  06/08/19 02/29/20  Anice Paganini, NP      Allergies    Patient has no known allergies.    Review of Systems   Review of Systems  Constitutional:  Negative for chills and fever.  HENT:  Negative for congestion.   Eyes: Negative.   Respiratory:  Negative for chest tightness and shortness of breath.   Cardiovascular:  Positive for leg swelling. Negative for chest pain and palpitations.  Gastrointestinal: Negative.   Genitourinary: Negative.   Musculoskeletal:  Positive for arthralgias.  Skin: Negative.   Neurological:  Negative for dizziness and headaches.  Psychiatric/Behavioral: Negative.      Physical Exam Updated Vital Signs BP 138/75 (BP Location: Left Arm)   Pulse 78   Temp 98.1 F (36.7 C) (Oral)   Resp 16   Ht 5\' 6"  (1.676 m)   Wt 77.1 kg   SpO2 94%   BMI 27.44 kg/m  Physical Exam Vitals and  nursing note reviewed.  Constitutional:      Appearance: She is well-developed.  HENT:     Head: Normocephalic and atraumatic.  Eyes:     Conjunctiva/sclera: Conjunctivae normal.  Cardiovascular:     Rate and Rhythm: Normal rate and regular rhythm.     Heart sounds: Normal heart sounds.  Pulmonary:     Effort: Pulmonary effort is normal.     Breath sounds: Normal breath sounds. No wheezing.  Abdominal:     General: Bowel sounds are normal.     Palpations: Abdomen is soft.     Tenderness: There is no abdominal tenderness.  Musculoskeletal:        General:  Normal range of motion.     Cervical back: Normal range of motion.  Skin:    General: Skin is warm and dry.  Neurological:     Mental Status: She is alert.     ED Results / Procedures / Treatments   Labs (all labs ordered are listed, but only abnormal results are displayed) Labs Reviewed  BASIC METABOLIC PANEL - Abnormal; Notable for the following components:      Result Value   Glucose, Bld 121 (*)    BUN 7 (*)    Calcium 8.8 (*)    All other components within normal limits  D-DIMER, QUANTITATIVE - Abnormal; Notable for the following components:   D-Dimer, Quant 0.58 (*)    All other components within normal limits  CBC WITH DIFFERENTIAL/PLATELET  BRAIN NATRIURETIC PEPTIDE    EKG None  Radiology No results found.  Procedures Procedures    Medications Ordered in ED Medications - No data to display  ED Course/ Medical Decision Making/ A&P                                 Medical Decision Making Patient presenting for evaluation of bilateral intermittent lower extremity edema, improves with elevation.  No injuries.  She does have significant reduced mobility given peripheral neuropathy, is ambulatory but spends a great amount of time at rest, no chest pain, no shortness of breath, no history of hypercoagulability.  However she does have increased risk for DVT, less likely given her symptoms are bilateral and are intermittent.  This could also represent CHF, versus peripheral venous insufficiency which would be more consistent with intermittent edema.  Her exam does not suggest DVT, negative Homans' sign, and actually has no peripheral edema during this exam.  Labs below are reassuring, she will be prescribed Lasix for as needed use, advised continued elevation as needed for significant episodes of edema.  Plan follow-up with her primary provider for recheck if symptoms are not improving with this treatment plan.  Amount and/or Complexity of Data Reviewed Labs: ordered.     Details: Labs are reassuring she has an age-adjusted D-dimer negative D-dimer at 0.58.  Her electrolytes are normal, CBC normal  Risk Prescription drug management.           Final Clinical Impression(s) / ED Diagnoses Final diagnoses:  Bilateral lower extremity edema    Rx / DC Orders ED Discharge Orders          Ordered    furosemide (LASIX) 20 MG tablet  Daily PRN        10/05/23 1641              Burgess Amor, PA-C 10/08/23 1627    Rondel Baton, MD 10/10/23 [phone]

## 2023-10-05 NOTE — Discharge Instructions (Signed)
Take the med patient prescribed, 1 tablet daily but only as needed for peripheral edema.  Plan follow-up care with your primary doctor if your symptoms persist or worsen.  Your lab tests and exam today are reassuring including normal kidney function, normal D-dimer which helps eliminate possibility of blood clot, and no evidence that this peripheral edema is secondary to any heart problems.

## 2023-10-05 NOTE — Telephone Encounter (Signed)
Copied from CRM [id number]. Topic: Clinical - Red Word Triage >> Oct 05, 2023 12:03 PM Dennison Nancy wrote: Red Word that prompted transfer to Nurse Triage: patient called and spoke with a nurse and was advise to go hospital in  due to tightness in legs, patient call to let nurse know she is go to the emergency in 20 minutes at  [facility] . also want to let Dr. Tanya Nones nurse Germaine Pomfret know she going to emergency room , nurse stated possible maybe a blood clot due to tightness of legs . Call back #[phone]   Chief Complaint: Leg Tightness Symptoms: Swelling, Stiffness in BLE Frequency: Acute Pertinent Negatives: Patient did not answer questions regarding pertinent symptoms or negative presentations.  Disposition: [x] ED /[] Urgent Care (no appt availability in office) / [] Appointment(In office/virtual)/ []  [place] [facility]/ [] Home Care/ [] Refused Recommended Disposition /[] [place] [facility]/ []  Follow-up with PCP Additional Notes: Returned triage call in the queue, patient is currently in the ER at [facility] and did not complete the Triage Protocol Assessment.    Reason for Disposition  [1] Can't walk or can barely walk AND [2] new-onset  Answer Assessment - Initial Assessment Questions 1. ONSET: "When did the swelling start?" (e.g., minutes, hours, days)     One Week Ago 2. LOCATION: "What part of the leg is swollen?"  "Are both legs swollen or just one leg?"     Both legs are swollen 3. SEVERITY: "How bad is the swelling?" (e.g., localized; mild, moderate, severe)   - Localized: Small area of swelling localized to one leg.   - MILD pedal edema: Swelling limited to foot and ankle, pitting edema < 1/4 inch (6 mm) deep, rest and elevation eliminate most or all swelling.   - MODERATE edema: Swelling of lower leg to knee, pitting edema > 1/4 inch (6 mm) deep, rest and elevation only partially reduce swelling.   - SEVERE edema: Swelling extends above knee, facial or hand  swelling present.      Moderate 4. REDNESS: "Does the swelling look red or infected?"     No 5. PAIN: "Is the swelling painful to touch?" If Yes, ask: "How painful is it?"   (Scale 1-10; mild, moderate or severe)     Patient reported stiffness.  6. FEVER: "Do you have a fever?" If Yes, ask: "What is it, how was it measured, and when did it start?"     Did not answer  7. CAUSE: "What do you think is causing the leg swelling?"     Did not answer  8. MEDICAL HISTORY: "Do you have a history of blood clots (e.g., DVT), cancer, heart failure, kidney disease, or liver failure?"     Did not answer 9. RECURRENT SYMPTOM: "Have you had leg swelling before?" If Yes, ask: "When was the last time?" "What happened that time?"     Did not answer  10. OTHER SYMPTOMS: "Do you have any other symptoms?" (e.g., chest pain, difficulty breathing)       Did not answer  11. PREGNANCY: "Is there any chance you are pregnant?" "When was your last menstrual period?"       Did not answer  Protocols used: Leg Swelling and Edema-A-AH

## 2023-10-05 NOTE — ED Triage Notes (Signed)
Leg swelling for a week. Recent change from Lithium to Caplyta. Seen by Dr. Tanya Nones at [facility] about the same concern. Feels like her concerns were "blown off."

## 2023-10-05 NOTE — Telephone Encounter (Signed)
Copied from CRM [id number]. Topic: Clinical - Medication Question >> Oct 05, 2023  4:21 PM Alcus Dad H wrote: Reason for CRM: Patient is wanting a call back from Germaine Pomfret regarding getting on Lexapro

## 2023-10-05 NOTE — Telephone Encounter (Signed)
  Chief Complaint: Bilateral Leg Swelling Symptoms: Unable to walk, moderate pain Frequency: gradually getting tighter Pertinent Negatives: Patient denies injury, falls, or fever Disposition: [] ED /[] Urgent Care (no appt availability in office) / [] Appointment(In office/virtual)/ []  [place] [facility]/ [] Home Care/ [] Refused Recommended Disposition /[] [place] [facility]/ []  Follow-up with PCP Additional Notes: Patient has been advised to ED but wishes to speak with PCP first.    Reason for Disposition  [1] Can't walk or can barely walk AND [2] new-onset  Answer Assessment - Initial Assessment Questions 1. ONSET: "When did the swelling start?" (e.g., minutes, hours, days)     Swelling for several weeks since starting new medication (caplyta) 2. LOCATION: "What part of the leg is swollen?"  "Are both legs swollen or just one leg?"     Both legs around calf and on the thigh 3. SEVERITY: "How bad is the swelling?" (e.g., localized; mild, moderate, severe)   - Localized: Small area of swelling localized to one leg.   - MILD pedal edema: Swelling limited to foot and ankle, pitting edema < 1/4 inch (6 mm) deep, rest and elevation eliminate most or all swelling.   - MODERATE edema: Swelling of lower leg to knee, pitting edema > 1/4 inch (6 mm) deep, rest and elevation only partially reduce swelling.   - SEVERE edema: Swelling extends above knee, facial or hand swelling present.      Moderate; very uncomfortable and week 4. REDNESS: "Does the swelling look red or infected?"     No, just feels tight.  5. PAIN: "Is the swelling painful to touch?" If Yes, ask: "How painful is it?"   (Scale 1-10; mild, moderate or severe)     No 6. FEVER: "Do you have a fever?" If Yes, ask: "What is it, how was it measured, and when did it start?"      No Fever 7. CAUSE: "What do you think is causing the leg swelling?"     New medication but unsure of other causes.  8. MEDICAL HISTORY: "Do you have a  history of blood clots (e.g., DVT), cancer, heart failure, kidney disease, or liver failure?"     No 9. RECURRENT SYMPTOM: "Have you had leg swelling before?" If Yes, ask: "When was the last time?" "What happened that time?"     No 10. OTHER SYMPTOMS: "Do you have any other symptoms?" (e.g., chest pain, difficulty breathing)       No  11. PREGNANCY: "Is there any chance you are pregnant?" "When was your last menstrual period?"       No  Protocols used: Leg Swelling and Edema-A-AH

## 2023-10-05 NOTE — ED Notes (Signed)
Pt states she is unable to walk due to the medication change last week. Pt states she transfers herself in the w/c at home. Pt stated prior to medication change pt used her w/c but in certain small cases her home health aide attempted to ambulate with a walker.

## 2023-10-05 NOTE — Telephone Encounter (Signed)
Requested Prescriptions  Refused Prescriptions Disp Refills   mirtazapine (REMERON) 30 MG tablet [Pharmacy Med Name: mirtazapine 30 mg tablet] 30 tablet 3    Sig: Take 1 tablet (30 mg total) by mouth at bedtime.     Psychiatry: Antidepressants - mirtazapine Failed - 10/04/2023  8:02 AM      Failed - Valid encounter within last 6 months    Recent Outpatient Visits           2 years ago Other specified hypothyroidism   [facility] Pickard, Priscille Heidelberg, MD   2 years ago Bipolar I disorder [facility])   [facility] Pickard, Priscille Heidelberg, MD   3 years ago Other specified hypothyroidism   [facility] Donita Fidler, MD   4 years ago Chest pain due to gastrointestinal reflux disease   [facility] Donita Deloney, MD   5 years ago Hypothyroidism, unspecified type   [facility] Pickard, Priscille Heidelberg, MD       Future Appointments             In 2 months Pickard, Priscille Heidelberg, MD [facility]            Passed - Completed PHQ-2 or PHQ-9 in the last 360 days

## 2023-10-06 ENCOUNTER — Telehealth: Payer: Self-pay

## 2023-10-06 NOTE — Telephone Encounter (Signed)
Spoke with pt x 2 today regarding and spoke with her yesterday before she went to the ER. See TE from earlier today and yesterday. Mjp,lpn  Copied from CRM [id number]. Topic: Clinical - Medication Question >> Oct 06, 2023 11:35 AM Larwance Sachs wrote: Reason for CRM: Patient called in to speaking with Germaine Pomfret, patient stated after waiting for a callback that was never received she ended uo going to the emergency room for tightness in legs and was given Lasik.  Patient stated E.R. informed her that she should not be taking Catlyca either due to side effects of medication. Hospital recommended scheduling patient with Cardiologist and patient also request some medication for condition so she does not "flip out"

## 2023-10-08 ENCOUNTER — Ambulatory Visit: Payer: MEDICAID

## 2023-10-08 ENCOUNTER — Other Ambulatory Visit: Payer: Self-pay

## 2023-10-08 DIAGNOSIS — F319 Bipolar disorder, unspecified: Secondary | ICD-10-CM

## 2023-10-08 DIAGNOSIS — G6289 Other specified polyneuropathies: Secondary | ICD-10-CM

## 2023-10-12 ENCOUNTER — Ambulatory Visit: Payer: Self-pay | Admitting: Family Medicine

## 2023-10-12 NOTE — Telephone Encounter (Signed)
Just spoke with patient. Pt states that the Caplyta doesn't seem strong enough. Pt states she has been having "weird thoughts" and "suicidal thoughts" on the regular dose. Pt states she did take a 2nd dose this morning and the thoughts went away. Pt states she does have a psychiatric appt 12/16. Pt refused to go to ER since thoughts went away after 2nd dose.

## 2023-10-12 NOTE — Telephone Encounter (Signed)
  Chief Complaint: "different thoughts", increase dose of Caplyta, believes it is working just would like increase dose Pertinent Negatives: Patient denies SI/HI Disposition: [] ED /[] Urgent Care (no appt availability in office) / [] Appointment(In office/virtual)/ []  [place] [facility]/ [] Home Care/ [] Refused Recommended Disposition /[] [place] [facility]/ [x]  Follow-up with PCP Additional Notes: PCP visit next Tuesday, cannot get there sooner than Tuesday d/t transportation issues.  W/C bound. Pt advised to call office or 911 if thoughts return.  Copied from CRM [id number]. Topic: Clinical - Red Word Triage >> Oct 12, 2023  9:11 AM Prudencio Pair wrote: Red Word that prompted transfer to Nurse Triage: Patient calling to speak with nurse. States she's been on Caplyta for a couple of weeks and she went to hospital & they put her on Lasix and she's urinating a lot. Pt states the Caplyta is not strong enough and she's feeling weird. Not feeling like herself and is having weird thoughts. States medicine is not working. Pt states she took another pill and then the thoughts went away. Reason for Disposition  [1] Caller has URGENT medicine question about med that PCP or specialist prescribed AND [2] triager unable to answer question  Answer Assessment - Initial Assessment Questions 1. NAME of MEDICINE: "What medicine(s) are you calling about?"     Caplyta 2. QUESTION: "What is your question?" (e.g., double dose of medicine, side effect)     Pt feels like she needs more of this medication, does not feel like it is lasting "long enough" 3. PRESCRIBER: "Who prescribed the medicine?" Reason: if prescribed by specialist, call should be referred to that group.     PCP 4. SYMPTOMS: "Do you have any symptoms?" If Yes, ask: "What symptoms are you having?"  "How bad are the symptoms (e.g., mild, moderate, severe)     "Different thoughts", better after taking a second dose (she has done this on two  occasions)  Protocols used: Medication Question Call-A-AH

## 2023-10-12 NOTE — Telephone Encounter (Signed)
Was given a Psyche appt but cannot be seen until 11/09/2023. What would you like her to do until that appt? Chief Complaint: Caplyta Symptoms: suicidal thoughts if she does not take a second dose approx 4hr later Frequency: happens daily if doesn't take the second pill Pertinent Negatives: Patient denies SI/HI at this time.  Disposition: [] ED /[] Urgent Care (no appt availability in office) / [] Appointment(In office/virtual)/ []  [place] [facility]/ [] Home Care/ [] Refused Recommended Disposition /[] [place] [facility]/ []  Follow-up with PCP Additional Notes: Recently switched to caplyta, switched from lithium 600 mg " a few weeks ago". Was given a Psyche appt but cannot be seen until 11/09/2023. What would you like her to do until that appt? Advised to call 911 if the thoughts return, pt is reluctant to call 911 d/t inability for her W/C to be transported with her.   Reason for Disposition  [1] Caller has URGENT medicine question about med that PCP or specialist prescribed AND [2] triager unable to answer question  Answer Assessment - Initial Assessment Questions 1. NAME of MEDICINE: "What medicine(s) are you calling about?"    caplyta 2. QUESTION: "What is your question?" (e.g., double dose of medicine, side effect)     What should she do until the appt 12/16 3. PRESCRIBER: "Who prescribed the medicine?" Reason: if prescribed by specialist, call should be referred to that group.     WP MD  Protocols used: Medication Question Call-A-AH

## 2023-10-14 ENCOUNTER — Other Ambulatory Visit: Payer: Self-pay | Admitting: Family Medicine

## 2023-10-14 NOTE — Telephone Encounter (Signed)
Requested medication (s) are due for refill today -no  Requested medication (s) are on the active medication list -yes  Future visit scheduled -yes  Last refill: 07/02/23 #60 3RF  Notes to clinic: non delegated Rx  Requested Prescriptions  Pending Prescriptions Disp Refills   pregabalin (LYRICA) 25 MG capsule [Pharmacy Med Name: pregabalin 25 mg capsule] 60 capsule 3    Sig: TAKE (1) CAPSULE BY MOUTH TWICE DAILY.     Not Delegated - Neurology:  Anticonvulsants - Controlled - pregabalin Failed - 10/14/2023  8:03 AM      Failed - This refill cannot be delegated      Failed - Valid encounter within last 12 months    Recent Outpatient Visits           2 years ago Other specified hypothyroidism   [facility] Pickard, Priscille Heidelberg, MD   2 years ago Bipolar I disorder [facility])   [facility] Pickard, Priscille Heidelberg, MD   3 years ago Other specified hypothyroidism   [facility] Donita Paragas, MD   4 years ago Chest pain due to gastrointestinal reflux disease   [facility] Tanya Nones, Priscille Heidelberg, MD   5 years ago Hypothyroidism, unspecified type   [facility] Pickard, Priscille Heidelberg, MD       Future Appointments             In 2 months Pickard, Priscille Heidelberg, MD [facility], [facility]            Passed - Cr in normal range and within 360 days    Creat  Date Value Ref Range Status  06/05/2023 0.93 0.50 - 1.05 mg/dL Final   Creatinine, Ser  Date Value Ref Range Status  10/05/2023 0.86 0.44 - 1.00 mg/dL Final   Creatinine,U  Date Value Ref Range Status  07/08/2010 111.9 mg/dL Final    Comment:    See lab report for associated comment(s)         Passed - Completed PHQ-2 or PHQ-9 in the last 360 days         Requested Prescriptions  Pending Prescriptions Disp Refills   pregabalin (LYRICA) 25 MG capsule [Pharmacy Med Name: pregabalin 25 mg capsule] 60 capsule 3    Sig: TAKE (1)  CAPSULE BY MOUTH TWICE DAILY.     Not Delegated - Neurology:  Anticonvulsants - Controlled - pregabalin Failed - 10/14/2023  8:03 AM      Failed - This refill cannot be delegated      Failed - Valid encounter within last 12 months    Recent Outpatient Visits           2 years ago Other specified hypothyroidism   [facility] Tanya Nones, Priscille Heidelberg, MD   2 years ago Bipolar I disorder [facility])   [facility] Pickard, Priscille Heidelberg, MD   3 years ago Other specified hypothyroidism   [facility] Medicine Donita Furlan, MD   4 years ago Chest pain due to gastrointestinal reflux disease   [facility] Medicine Donita Bishop, MD   5 years ago Hypothyroidism, unspecified type   [facility] Pickard, Priscille Heidelberg, MD       Future Appointments             In 2 months Pickard, Priscille Heidelberg, MD [facility], [facility]  Passed - Cr in normal range and within 360 days    Creat  Date Value Ref Range Status  06/05/2023 0.93 0.50 - 1.05 mg/dL Final   Creatinine, Ser  Date Value Ref Range Status  10/05/2023 0.86 0.44 - 1.00 mg/dL Final   Creatinine,U  Date Value Ref Range Status  07/08/2010 111.9 mg/dL Final    Comment:    See lab report for associated comment(s)         Passed - Completed PHQ-2 or PHQ-9 in the last 360 days

## 2023-10-19 ENCOUNTER — Telehealth: Payer: Self-pay

## 2023-10-19 NOTE — Telephone Encounter (Signed)
Copied from CRM [id number]. Topic: Referral - Status >> Oct 16, 2023  1:26 PM Dennison Nancy wrote: Reason for CRM: Patient request to speak to Germaine Pomfret on 10/16/23  about a letter from patient new insurance [facility] . regarding wanting in home physical therapy

## 2023-10-20 ENCOUNTER — Ambulatory Visit (INDEPENDENT_AMBULATORY_CARE_PROVIDER_SITE_OTHER): Payer: MEDICAID | Admitting: Family Medicine

## 2023-10-20 ENCOUNTER — Encounter: Payer: Self-pay | Admitting: Family Medicine

## 2023-10-20 VITALS — BP 124/72 | HR 88 | Temp 98.4°F | Ht 66.0 in | Wt 168.2 lb

## 2023-10-20 DIAGNOSIS — E538 Deficiency of other specified B group vitamins: Secondary | ICD-10-CM

## 2023-10-20 DIAGNOSIS — M6281 Muscle weakness (generalized): Secondary | ICD-10-CM | POA: Diagnosis not present

## 2023-10-20 MED ORDER — CYANOCOBALAMIN 1000 MCG/ML IJ SOLN
1000.0000 ug | Freq: Once | INTRAMUSCULAR | Status: AC
  Administered 2023-10-20: 1000 ug via INTRAMUSCULAR

## 2023-10-20 NOTE — Assessment & Plan Note (Signed)
Chronic at baseline. Normal exam in office today and no peripheral edema. She has been taking Lasix daily so will check CMP. B12 administered in office today at patients request as she is overdue her monthly injection. Seek medical care for swelling of lower extremities, worsening weakness or sensory changes, or shortness of breath. Follow up with PCP as needed.

## 2023-10-20 NOTE — Progress Notes (Signed)
Subjective:  HPI: Sheryl Hall is a 62 y.o. female presenting on 10/20/2023 for Leg Swelling (X 2 weeks bilateral legs .)   HPI Patient is in today for follow-up for ER visit on 11/11 for BLE edema. D-dimer during that visit was 0.58 and she was given PRN Lasix despite lack of edema on exam in ER. She reports still feeling tight in her legs. She has been taking Lasix 20mg  every day. This is a chronic complaint for Sheryl Hall. She has has had low B12 in past and is getting monthly injections. Has had several referrals to PT and is requesting home health. She also sees a Insurance account manager. She reports her symptoms are at baseline.    Review of Systems  Respiratory:  Negative for shortness of breath.   All other systems reviewed and are negative.   Relevant past medical history reviewed and updated as indicated.   Past Medical History:  Diagnosis Date   Anxiety    Arthritis    Avascular necrosis of hip (HCC)    left   Bipolar 1 disorder (HCC)    Chronic left hip pain    Chronic nausea    Depression    Diverticulosis of colon    Family hx of colon cancer    age 61-father   Folate deficiency    GERD (gastroesophageal reflux disease)    Headache    Heart murmur    Hx of abnormal Pap smear    Hyperlipidemia    Hypothyroidism    Keratosis, actinic    Low back pain 03/01/2013   MRI with multiple levels of disc bulge   Panic attacks    Polysubstance abuse (HCC)    PTSD (post-traumatic stress disorder)    S/P colonoscopy 05/30/2010   LAX sphincter tone, anal papilla, left-sided diverticulosis, normal random biopsies,, 1 polyp-TA   S/P endoscopy 05/30/2010   Dr Rourk-> non--critical Schatzki's ring, s/p 31F dilation   Seizures (HCC)    Stroke (HCC) 2014   Right parietal, no deficits    Tubular adenoma of colon 05/30/2010   Next colonoscopy 05/2015   UTI (urinary tract infection)      Past Surgical History:  Procedure Laterality Date   COLONOSCOPY     every 5 years    COLONOSCOPY  05/30/2010   RMR:lax anal sphincter tone,anal papilla,otherwise normal/left-sided diverticula   ESOPHAGOGASTRODUODENOSCOPY  05/30/2010   ZOX:[id number] ring/small HH otherwise normal   EXTERNAL EAR SURGERY Left 12 years ago   skin graft from behind ear put in ear canal   JOINT REPLACEMENT     NECK SURGERY  10 years ago   SKIN CANCER DESTRUCTION     TOTAL HIP ARTHROPLASTY Left 04/19/2014   Procedure: LEFT TOTAL HIP ARTHROPLASTY ANTERIOR APPROACH;  Surgeon: Loanne Drilling, MD;  Location: [facility];  Service: Orthopedics;  Laterality: Left;   TOTAL HIP ARTHROPLASTY Right 05/23/2015   Procedure: RIGHT TOTAL HIP ARTHROPLASTY ANTERIOR APPROACH;  Surgeon: Ollen Gross, MD;  Location: [facility];  Service: Orthopedics;  Laterality: Right;    Allergies and medications reviewed and updated.   Current Outpatient Medications:    clonazePAM (KLONOPIN) 0.5 MG tablet, Take 1 tablet (0.5 mg total) by mouth 2 (two) times daily as needed for anxiety., Disp: 60 tablet, Rfl: 1   furosemide (LASIX) 20 MG tablet, Take 1 tablet (20 mg total) by mouth daily as needed for edema., Disp: 15 tablet, Rfl: 0   levETIRAcetam (KEPPRA) 1000 MG tablet, Take 1 tablet (1,000 mg  total) by mouth 2 (two) times daily., Disp: 60 tablet, Rfl: 3   levothyroxine (SYNTHROID) 50 MCG tablet, Take 1 tablet (50 mcg total) by mouth daily., Disp: 30 tablet, Rfl: 3   Lumateperone Tosylate (CAPLYTA) 21 MG CAPS, Take 1 capsule by mouth daily. Stop lithium, Disp: 30 capsule, Rfl: 1   melatonin 3 MG TABS tablet, Take 2 tablets (6 mg total) by mouth at bedtime. (Patient taking differently: Take 3 mg by mouth at bedtime.), Disp: 60 tablet, Rfl: 3   omeprazole (PRILOSEC) 40 MG capsule, Take 1 capsule (40 mg total) by mouth daily., Disp: 30 capsule, Rfl: 3   pregabalin (LYRICA) 25 MG capsule, Take 1 capsule (25 mg total) by mouth 2 (two) times daily., Disp: 60 capsule, Rfl: 3   senna (SENOKOT) 8.6 MG tablet, Take 2 tablets (17.2 mg total)  by mouth daily., Disp: 60 tablet, Rfl: 3  No Known Allergies  Objective:   BP 124/72 (BP Location: Left Arm)   Pulse 88   Temp 98.4 F (36.9 C)   Ht 5\' 6"  (1.676 m)   Wt 168 lb 4 oz (76.3 kg)   SpO2 98%   BMI 27.16 kg/m      10/20/2023    2:18 PM 10/05/2023    5:06 PM 10/05/2023    3:30 PM  Vitals with BMI  Height 5\' 6"     Weight 168 lbs 4 oz    BMI 27.17    Systolic 124 138 829  Diastolic 72 75 70  Pulse 88 78 76     Physical Exam Vitals and nursing note reviewed.  Constitutional:      Appearance: Normal appearance. She is normal weight.  HENT:     Head: Normocephalic and atraumatic.  Cardiovascular:     Rate and Rhythm: Normal rate and regular rhythm.     Pulses: Normal pulses.          Dorsalis pedis pulses are 2+ on the right side and 2+ on the left side.       Posterior tibial pulses are 2+ on the right side and 2+ on the left side.     Heart sounds: Normal heart sounds.  Pulmonary:     Effort: Pulmonary effort is normal.     Breath sounds: Normal breath sounds.  Musculoskeletal:     Right lower leg: No edema.     Left lower leg: No edema.  Feet:     Right foot:     Skin integrity: Skin integrity normal.     Left foot:     Skin integrity: Skin integrity normal.  Skin:    General: Skin is warm and dry.  Neurological:     General: No focal deficit present.     Mental Status: She is alert and oriented to person, place, and time. Mental status is at baseline.  Psychiatric:        Mood and Affect: Mood normal.        Behavior: Behavior normal.        Thought Content: Thought content normal.        Judgment: Judgment normal.     Assessment & Plan:  Muscle weakness Assessment & Plan: Chronic at baseline. Normal exam in office today and no peripheral edema. She has been taking Lasix daily so will check CMP. B12 administered in office today at patients request as she is overdue her monthly injection. Seek medical care for swelling of lower extremities,  worsening weakness or sensory changes, or  shortness of breath. Follow up with PCP as needed.  Orders: -     COMPLETE METABOLIC PANEL WITH GFR -     Ambulatory referral to Home Health  B12 deficiency -     Vitamin B12 -     Cyanocobalamin     Follow up plan: Return if symptoms worsen or fail to improve.  Park Meo, FNP

## 2023-10-21 LAB — COMPLETE METABOLIC PANEL WITH GFR
AG Ratio: 1.5 (calc) (ref 1.0–2.5)
ALT: 6 U/L (ref 6–29)
AST: 7 U/L — ABNORMAL LOW (ref 10–35)
Albumin: 3.7 g/dL (ref 3.6–5.1)
Alkaline phosphatase (APISO): 87 U/L (ref 37–153)
BUN: 8 mg/dL (ref 7–25)
CO2: 26 mmol/L (ref 20–32)
Calcium: 8.7 mg/dL (ref 8.6–10.4)
Chloride: 104 mmol/L (ref 98–110)
Creat: 0.94 mg/dL (ref 0.50–1.05)
Globulin: 2.4 g/dL (ref 1.9–3.7)
Glucose, Bld: 96 mg/dL (ref 65–99)
Potassium: 4.1 mmol/L (ref 3.5–5.3)
Sodium: 141 mmol/L (ref 135–146)
Total Bilirubin: 0.3 mg/dL (ref 0.2–1.2)
Total Protein: 6.1 g/dL (ref 6.1–8.1)
eGFR: 69 mL/min/{1.73_m2} (ref >=60)

## 2023-10-21 LAB — VITAMIN B12: Vitamin B-12: 278 pg/mL (ref 200–1100)

## 2023-10-25 ENCOUNTER — Other Ambulatory Visit: Payer: Self-pay | Admitting: Family Medicine

## 2023-10-25 DIAGNOSIS — K219 Gastro-esophageal reflux disease without esophagitis: Secondary | ICD-10-CM

## 2023-10-26 ENCOUNTER — Ambulatory Visit (HOSPITAL_COMMUNITY): Payer: MEDICAID

## 2023-10-27 ENCOUNTER — Other Ambulatory Visit: Payer: Self-pay | Admitting: Family Medicine

## 2023-10-27 ENCOUNTER — Telehealth: Payer: Self-pay

## 2023-10-27 MED ORDER — PREGABALIN 25 MG PO CAPS: 25.0000 mg | ORAL_CAPSULE | Freq: Two times a day (BID) | ORAL | 3 refills | Status: DC

## 2023-10-27 NOTE — Telephone Encounter (Signed)
Requested medications are due for refill today.  unsure  Requested medications are on the active medications list.  no  Last refill.   Future visit scheduled.   yes  Notes to clinic.  Dosage changed. Refill/refusal not delegated.    Requested Prescriptions  Pending Prescriptions Disp Refills   pregabalin (LYRICA) 50 MG capsule [Pharmacy Med Name: pregabalin 50 mg capsule] 180 capsule 0    Sig: Take 1 capsule (50 mg total) by mouth 2 (two) times daily.     Not Delegated - Neurology:  Anticonvulsants - Controlled - pregabalin Failed - 10/27/2023  4:02 PM      Failed - This refill cannot be delegated      Failed - Valid encounter within last 12 months    Recent Outpatient Visits           2 years ago Other specified hypothyroidism   [facility] Medicine Pickard, Priscille Heidelberg, MD   2 years ago Bipolar I disorder [facility])   [facility] Pickard, Priscille Heidelberg, MD   3 years ago Other specified hypothyroidism   [facility] Medicine Donita Goto, MD   4 years ago Chest pain due to gastrointestinal reflux disease   [facility] Medicine Donita Winfield, MD   5 years ago Hypothyroidism, unspecified type   [facility] Medicine Pickard, Priscille Heidelberg, MD       Future Appointments             In 2 months Pickard, Priscille Heidelberg, MD [facility], [facility]            Passed - Cr in normal range and within 360 days    Creat  Date Value Ref Range Status  10/20/2023 0.94 0.50 - 1.05 mg/dL Final   Creatinine,U  Date Value Ref Range Status  07/08/2010 111.9 mg/dL Final    Comment:    See lab report for associated comment(s)         Passed - Completed PHQ-2 or PHQ-9 in the last 360 days

## 2023-10-27 NOTE — Telephone Encounter (Signed)
Copied from CRM [id number]. Topic: Clinical - Medication Refill >> Oct 27, 2023  4:32 PM Thomes Dinning wrote: Most Recent Primary Care Visit:  Provider: Kurtis Bushman S  Department[facility]  Visit Type: OFFICE VISIT  Date: 10/20/2023  Medication: pregabalin (LYRICA) 25 MG capsule  Has the patient contacted their pharmacy? Yes (Agent: If no, request that the patient contact the pharmacy for the refill. If patient does not wish to contact the pharmacy document the reason why and proceed with request.) (Agent: If yes, when and what did the pharmacy advise?)  Is this the correct pharmacy for this prescription? No If no, delete pharmacy and type the correct one.  This is the patient's preferred pharmacy: [facility], [place] - [id number] [place] [address] Phone: [phone] Fax: [phone]   Has the prescription been filled recently? Yes  Is the patient out of the medication? Yes  Has the patient been seen for an appointment in the last year OR does the patient have an upcoming appointment? Yes  Can we respond through MyChart? No  Agent: Please be advised that Rx refills may take up to 3 business days. We ask that you follow-up with your pharmacy.

## 2023-10-28 ENCOUNTER — Telehealth: Payer: Self-pay

## 2023-10-28 NOTE — Telephone Encounter (Signed)
Copied from CRM [id number]. Topic: Clinical - Medication Question >> Oct 28, 2023  9:19 AM Mosetta Putt H wrote: Reason for CRM: refill for pregabalin (LYRICA) 25 MG capsule

## 2023-10-28 NOTE — Telephone Encounter (Signed)
Requested Prescriptions  Pending Prescriptions Disp Refills   omeprazole (PRILOSEC) 40 MG capsule [Pharmacy Med Name: omeprazole 40 mg capsule,delayed release] 30 capsule 3    Sig: Take 1 capsule (40 mg total) by mouth daily.     Gastroenterology: Proton Pump Inhibitors Failed - 10/25/2023  8:03 AM      Failed - Valid encounter within last 12 months    Recent Outpatient Visits           2 years ago Other specified hypothyroidism   [facility] Tanya Nones, Priscille Heidelberg, MD   2 years ago Bipolar I disorder [facility])   [facility] Pickard, Priscille Heidelberg, MD   3 years ago Other specified hypothyroidism   [facility] Donita Clink, MD   4 years ago Chest pain due to gastrointestinal reflux disease   [facility] Medicine Donita Brem, MD   5 years ago Hypothyroidism, unspecified type   [facility] Pickard, Priscille Heidelberg, MD       Future Appointments             In 2 months Pickard, Priscille Heidelberg, MD [facility], [facility]

## 2023-11-01 ENCOUNTER — Other Ambulatory Visit: Payer: Self-pay | Admitting: Family Medicine

## 2023-11-01 DIAGNOSIS — G40209 Localization-related (focal) (partial) symptomatic epilepsy and epileptic syndromes with complex partial seizures, not intractable, without status epilepticus: Secondary | ICD-10-CM

## 2023-11-01 DIAGNOSIS — I639 Cerebral infarction, unspecified: Secondary | ICD-10-CM

## 2023-11-02 ENCOUNTER — Other Ambulatory Visit: Payer: Self-pay | Admitting: Family Medicine

## 2023-11-02 NOTE — Telephone Encounter (Signed)
Called and spoke w/pt's sister, Darl Pikes. Told sister a refer for home PT has already been put in 10/20/23. Sister will check w/pt and let us know.

## 2023-11-03 ENCOUNTER — Telehealth: Payer: Self-pay

## 2023-11-03 ENCOUNTER — Other Ambulatory Visit: Payer: Self-pay | Admitting: Family Medicine

## 2023-11-03 MED ORDER — MIRTAZAPINE 30 MG PO TABS: 30.0000 mg | ORAL_TABLET | Freq: Every day | ORAL | 5 refills | Status: DC

## 2023-11-03 NOTE — Telephone Encounter (Signed)
Copied from CRM [id number]. Topic: Referral - Status >> Nov 02, 2023  4:44 PM Cassiday T wrote: Reason for CRM: patient sister called in stating the pt has called patient yet

## 2023-11-03 NOTE — Telephone Encounter (Signed)
Copied from CRM [id number]. Topic: Clinical - Medication Question >> Nov 03, 2023 10:37 AM Hector Shade B wrote: Reason for CRM:  mirtazapine (REMERON) 30 MG tablet Patient is requesting that the provider place back on the medication she was taken off about 3 weeks ago and had not appetite she had one pill left from that prescription and took the medicine was able to eat and felt better. She called the pharmacy and they said that she should call the provider to advise on the medication needed and pharmacy stated they would also fax over a request for the refill

## 2023-11-03 NOTE — Telephone Encounter (Signed)
Sheryl Hall, can you call Sheryl Hall and let her know that the referral was sent to [facility] for Out Pt Physical Therapy because there isn't a home health agency that takes [name]. [facility] number is [phone].  Thank you!!  Message in pt's referral note from Wilber Bihari: This patients insurance does not have a Home Health Agency in this area that takes [name].    Patient said she is calling [name].  She said they told her they could have [name] come to her home.  I can not find any service that takes [name] and let the patient know she needs to go to OP PT.  I have sent the referral back to [facility] .

## 2023-11-05 ENCOUNTER — Telehealth (HOSPITAL_COMMUNITY): Payer: Self-pay

## 2023-11-05 ENCOUNTER — Encounter (HOSPITAL_COMMUNITY): Payer: Self-pay

## 2023-11-05 NOTE — Telephone Encounter (Signed)
Called to confirm 11/09/23 appt no answer number not working sending [employer]

## 2023-11-05 NOTE — Telephone Encounter (Signed)
Pt called back.  appt confirmed

## 2023-11-09 ENCOUNTER — Ambulatory Visit (HOSPITAL_COMMUNITY): Payer: MEDICAID | Admitting: Psychiatry

## 2023-11-09 ENCOUNTER — Telehealth (HOSPITAL_COMMUNITY): Payer: Self-pay

## 2023-11-09 NOTE — Telephone Encounter (Signed)
Pt and her caretaker were not able to join virtual appt today. Dr Adrian Blackwater had sent 2 text messages and they claimed that they did not receive them. Spoke with pt in regards to rescheduling, caregiver got on phone stating that pt needs to see someone right now due to not being on her medications and previous Dr dropping pt as a client. She stated that last week pt was suicidal but is ok today no si's or homicidal ideations. Pt was upset about not seeing provider today. Spoke with Dr Adrian Blackwater he stated that based on recent cocaine usage in April pt needs to see [facility] and he is unable to see her. Advised per provider [facility] or [facility] if she is in current crisis. Caregiver will talk to her agency and let them know.

## 2023-11-12 ENCOUNTER — Other Ambulatory Visit: Payer: Self-pay | Admitting: Family Medicine

## 2023-11-12 NOTE — Telephone Encounter (Signed)
Requested medication (s) are due for refill today - yes  Requested medication (s) are on the active medication list -yes  Future visit scheduled -yes  Last refill: 09/25/23 #30 1RF  Notes to clinic: off protocol- provider review   Requested Prescriptions  Pending Prescriptions Disp Refills   CAPLYTA 21 MG CAPS [Pharmacy Med Name: Caplyta 21 mg capsule] 30 capsule 1    Sig: Take 1 capsule by mouth daily. Stop lithium     Off-Protocol Failed - 11/12/2023 10:00 AM      Failed - Medication not assigned to a protocol, review manually.      Failed - Valid encounter within last 12 months    Recent Outpatient Visits           2 years ago Other specified hypothyroidism   [facility] Pickard, Priscille Heidelberg, MD   2 years ago Bipolar I disorder [facility])   [facility] Pickard, Priscille Heidelberg, MD   3 years ago Other specified hypothyroidism   [facility] Medicine Donita Rickles, MD   4 years ago Chest pain due to gastrointestinal reflux disease   [facility] Donita Deupree, MD   5 years ago Hypothyroidism, unspecified type   [facility] Medicine Pickard, Priscille Heidelberg, MD       Future Appointments             In 1 month Pickard, Priscille Heidelberg, MD [facility], [facility]               Requested Prescriptions  Pending Prescriptions Disp Refills   CAPLYTA 21 MG CAPS [Pharmacy Med Name: Caplyta 21 mg capsule] 30 capsule 1    Sig: Take 1 capsule by mouth daily. Stop lithium     Off-Protocol Failed - 11/12/2023 10:00 AM      Failed - Medication not assigned to a protocol, review manually.      Failed - Valid encounter within last 12 months    Recent Outpatient Visits           2 years ago Other specified hypothyroidism   [facility] Medicine Pickard, Priscille Heidelberg, MD   2 years ago Bipolar I disorder [facility])   [facility] Medicine Pickard, Priscille Heidelberg, MD   3 years ago Other specified  hypothyroidism   [facility] Medicine Donita Macchi, MD   4 years ago Chest pain due to gastrointestinal reflux disease   [facility] Donita Gwaltney, MD   5 years ago Hypothyroidism, unspecified type   [facility] Medicine Pickard, Priscille Heidelberg, MD       Future Appointments             In 1 month Pickard, Priscille Heidelberg, MD [facility], [facility]

## 2023-11-17 ENCOUNTER — Ambulatory Visit (HOSPITAL_COMMUNITY): Payer: MEDICAID | Admitting: Psychiatry

## 2023-11-23 ENCOUNTER — Inpatient Hospital Stay: Payer: MEDICAID | Admitting: Family Medicine

## 2023-11-23 ENCOUNTER — Ambulatory Visit: Payer: MEDICAID | Admitting: Family Medicine

## 2023-11-23 NOTE — Progress Notes (Deleted)
Several unsuccessful attempts were made for virtual video visit. Contact not made with patient today.

## 2023-11-25 ENCOUNTER — Other Ambulatory Visit: Payer: Self-pay | Admitting: Family Medicine

## 2023-11-25 DIAGNOSIS — E0590 Thyrotoxicosis, unspecified without thyrotoxic crisis or storm: Secondary | ICD-10-CM

## 2023-11-27 ENCOUNTER — Telehealth: Payer: MEDICAID | Admitting: Family Medicine

## 2023-11-27 DIAGNOSIS — F319 Bipolar disorder, unspecified: Secondary | ICD-10-CM

## 2023-11-27 MED ORDER — ESCITALOPRAM OXALATE 10 MG PO TABS: 10.0000 mg | ORAL_TABLET | Freq: Every day | ORAL | 1 refills | Status: DC

## 2023-11-27 MED ORDER — LAMOTRIGINE 25 MG PO TABS: 25.0000 mg | ORAL_TABLET | Freq: Two times a day (BID) | ORAL | 1 refills | Status: AC

## 2023-11-27 MED ORDER — TRAZODONE HCL 50 MG PO TABS: 50.0000 mg | ORAL_TABLET | Freq: Every day | ORAL | 1 refills | Status: DC

## 2023-11-27 MED ORDER — ARIPIPRAZOLE 2 MG PO TABS: 2.0000 mg | ORAL_TABLET | Freq: Every evening | ORAL | 1 refills | Status: AC

## 2023-11-27 NOTE — Progress Notes (Signed)
 Subjective:    Patient ID: Sheryl Hall, female    DOB: 1961-05-27, 63 y.o.   MRN: [id number]  HPI  Patient is being seen today as a telephone visit.  Phone call began at 12:00.  Phone call concluded at 1215.  The patient is currently at home.  I am currently in my office.  The patient consents to be seen via telephone.  Patient has a history of bipolar.  I had started the patient on Caplyta  and she was doing well.  However on December 16, she was admitted to an outside hospital's behavioral health program for suicidal ideation.  Patient was in the hospital from December 16 through January 23.  They discontinued and changed many of her medications.  They stopped her Caplyta .  They switched the patient to Abilify  2 mg p.o. nightly, Lexapro  10 mg daily, trazodone  50 mg at night, Lamictal  25 mg twice daily.  Patient states that she is doing much better.  They discharged the patient with no psychiatry follow-up.  She is requesting that I refill the medications because they only gave her 15 days for the medicine.  Her previous psychiatrist has dismissed her due to cocaine use in the spring. Past Medical History:  Diagnosis Date   Anxiety    Arthritis    Avascular necrosis of hip (HCC)    left   Bipolar 1 disorder (HCC)    Chronic left hip pain    Chronic nausea    Depression    Diverticulosis of colon    Family hx of colon cancer    age 71-father   Folate deficiency    GERD (gastroesophageal reflux disease)    Headache    Heart murmur    Hx of abnormal Pap smear    Hyperlipidemia    Hypothyroidism    Keratosis, actinic    Low back pain 03/01/2013   MRI with multiple levels of disc bulge   Panic attacks    Polysubstance abuse (HCC)    PTSD (post-traumatic stress disorder)    S/P colonoscopy 05/30/2010   LAX sphincter tone, anal papilla, left-sided diverticulosis, normal random biopsies,, 1 polyp-TA   S/P endoscopy 05/30/2010   Dr Rourk-> non--critical Schatzki's ring, s/p 83F  dilation   Seizures (HCC)    Stroke (HCC) 2014   Right parietal, no deficits    Tubular adenoma of colon 05/30/2010   Next colonoscopy 05/2015   UTI (urinary tract infection)    Past Surgical History:  Procedure Laterality Date   COLONOSCOPY     every 5 years   COLONOSCOPY  05/30/2010   RMR:lax anal sphincter tone,anal papilla,otherwise normal/left-sided diverticula   ESOPHAGOGASTRODUODENOSCOPY  05/30/2010   MFM:wnwdryjusxp'd ring/small HH otherwise normal   EXTERNAL EAR SURGERY Left 12 years ago   skin graft from behind ear put in ear canal   JOINT REPLACEMENT     NECK SURGERY  10 years ago   SKIN CANCER DESTRUCTION     TOTAL HIP ARTHROPLASTY Left 04/19/2014   Procedure: LEFT TOTAL HIP ARTHROPLASTY ANTERIOR APPROACH;  Surgeon: Dempsey Melodi GAILS, MD;  Location: [facility];  Service: Orthopedics;  Laterality: Left;   TOTAL HIP ARTHROPLASTY Right 05/23/2015   Procedure: RIGHT TOTAL HIP ARTHROPLASTY ANTERIOR APPROACH;  Surgeon: Dempsey Melodi, MD;  Location: [facility];  Service: Orthopedics;  Laterality: Right;   Current Outpatient Medications on File Prior to Visit  Medication Sig Dispense Refill   furosemide  (LASIX ) 20 MG tablet Take 1 tablet (20 mg total) by mouth  daily as needed for edema. 15 tablet 0   levETIRAcetam  (KEPPRA ) 1000 MG tablet Take 1 tablet (1,000 mg total) by mouth 2 (two) times daily. 60 tablet 3   levothyroxine  (SYNTHROID ) 50 MCG tablet Take 1 tablet (50 mcg total) by mouth daily. 30 tablet 3   melatonin 3 MG TABS tablet Take 2 tablets (6 mg total) by mouth at bedtime. (Patient taking differently: Take 3 mg by mouth at bedtime.) 60 tablet 3   omeprazole  (PRILOSEC) 40 MG capsule Take 1 capsule (40 mg total) by mouth daily. 30 capsule 3   pregabalin  (LYRICA ) 25 MG capsule Take 1 capsule (25 mg total) by mouth 2 (two) times daily. 60 capsule 3   [DISCONTINUED] LINZESS  72 MCG capsule TAKE ONE CAPSULE ORALLY EVERY MORNING BEFORE BREAKFAST. (Patient taking differently: Take 72 mcg by  mouth daily before breakfast. ) 30 capsule 1   No current facility-administered medications on file prior to visit.   No Known Allergies Social History   Socioeconomic History   Marital status: Divorced    Spouse name: Not on file   Number of children: 1   Years of education: Not on file   Highest education level: Not on file  Occupational History   Occupation: disabled  Tobacco Use   Smoking status: Former    Current packs/day: 0.00    Average packs/day: 0.5 packs/day for 30.0 years (15.0 ttl pk-yrs)    Types: Cigarettes    Start date: 04/24/1992    Quit date: 04/24/2022    Years since quitting: 1.5   Smokeless tobacco: Never   Tobacco comments:    trying to quit  Vaping Use   Vaping status: Never Used  Substance and Sexual Activity   Alcohol use: No    Alcohol/week: 0.0 standard drinks of alcohol   Drug use: No    Comment: hx cocaine abuse   Sexual activity: Never    Birth control/protection: None  Other Topics Concern   Not on file  Social History Narrative   Patient lives at home alone and she is single.    Disabled.   Education college education.   Right handed.   Caffeine mountain dew four daily.    Social Drivers of Corporate Investment Banker Strain: Not on file  Food Insecurity: No Food Insecurity (09/18/2023)   Hunger Vital Sign    Worried About Running Out of Food in the Last Year: Never true    Ran Out of Food in the Last Year: Never true  Transportation Needs: Unmet Transportation Needs (09/18/2023)   PRAPARE - Administrator, Civil Service (Medical): Yes    Lack of Transportation (Non-Medical): Yes  Physical Activity: Not on file  Stress: Not on file  Social Connections: Not on file  Intimate Partner Violence: Not At Risk (03/03/2023)   Humiliation, Afraid, Rape, and Kick questionnaire    Fear of Current or Ex-Partner: No    Emotionally Abused: No    Physically Abused: No    Sexually Abused: No     Review of Systems  All other  systems reviewed and are negative.      Objective:   Physical Exam   Physical exam could not be performed today as the patient is being seen via telephone.     Assessment & Plan:  Bipolar depression [facility]) Patient requires a psychiatrist.  Patient has tried and failed numerous medications including the majority that she is taking right now.  Therefore, I am concerned that  she will experience another downturn in the near future.  She has been on Lexapro , trazodone , Abilify  in the past and stopped the medications for various reasons.  Her pattern is to report feeling better on medication and then discontinue them due to perceived side effects.  Therefore, although she is feeling better, I feel that we need to arrange psychiatry consultation to help manage her condition. Her [facility] program is requesting 8 hours a month with transportation to another doctor visit.  I am fine with that.  She was receiving 6 hours.  Patient states that she is sleeping well.  She denies any suicidal ideations at the present time.  She denies any symptoms of mania.  Today on the phone she does not demonstrate pressured speech with tangential thoughts.  Therefore I will refill her medications as prescribed until she can meet with a psychiatrist.

## 2023-12-01 ENCOUNTER — Other Ambulatory Visit: Payer: Self-pay

## 2023-12-01 ENCOUNTER — Telehealth: Payer: Self-pay

## 2023-12-01 DIAGNOSIS — K581 Irritable bowel syndrome with constipation: Secondary | ICD-10-CM

## 2023-12-01 DIAGNOSIS — K219 Gastro-esophageal reflux disease without esophagitis: Secondary | ICD-10-CM

## 2023-12-01 MED ORDER — OMEPRAZOLE 40 MG PO CPDR: 40.0000 mg | DELAYED_RELEASE_CAPSULE | Freq: Two times a day (BID) | ORAL | 3 refills | Status: DC

## 2023-12-01 NOTE — Telephone Encounter (Signed)
 Copied from CRM [id number]. Topic: Clinical - Prescription Issue >> Nov 30, 2023  4:45 PM Elle L wrote: Reason for CRM: The patient is on omeprazole  (PRILOSEC) 40 MG capsule but states she was on 75 MG previously and wants to see if the dosage can be higher again. The patient's preferred pharmacy is [facility] [place], [place] - [id number] [place] [address] Phone: [phone]  Fax: [phone]. The patient's call back number is [phone] if needed.

## 2023-12-02 ENCOUNTER — Ambulatory Visit: Payer: MEDICAID

## 2023-12-03 ENCOUNTER — Telehealth: Payer: Self-pay

## 2023-12-03 NOTE — Telephone Encounter (Signed)
 Copied from CRM [id number]. Topic: General - Other >> Dec 03, 2023 12:22 PM Deleta HERO wrote: Reason for CRM: The patient states she requested to obtain a shower chair by her PCP. She states this will come from [facility] and provided their number: [phone] for Dr. Duanne.

## 2023-12-04 ENCOUNTER — Other Ambulatory Visit: Payer: Self-pay

## 2023-12-04 DIAGNOSIS — G40209 Localization-related (focal) (partial) symptomatic epilepsy and epileptic syndromes with complex partial seizures, not intractable, without status epilepticus: Secondary | ICD-10-CM

## 2023-12-04 DIAGNOSIS — I639 Cerebral infarction, unspecified: Secondary | ICD-10-CM

## 2023-12-04 NOTE — Telephone Encounter (Signed)
 Prescription Request  12/04/2023  LOV: 11/27/23  What is the name of the medication or equipment? levETIRAcetam  (KEPPRA ) 1000 MG tablet [[id number]]   Have you contacted your pharmacy to request a refill? Yes   Which pharmacy would you like this sent to?  [facility] [place], [place] - [id number] [place] [address] Phone: [phone] Fax: [phone]    Patient notified that their request is being sent to the clinical staff for review and that they should receive a response within 2 business days.   Please advise at [facility] [phone]

## 2023-12-07 ENCOUNTER — Other Ambulatory Visit: Payer: Self-pay | Admitting: Family Medicine

## 2023-12-07 ENCOUNTER — Encounter: Payer: Self-pay | Admitting: Family Medicine

## 2023-12-07 DIAGNOSIS — I639 Cerebral infarction, unspecified: Secondary | ICD-10-CM

## 2023-12-07 DIAGNOSIS — G40209 Localization-related (focal) (partial) symptomatic epilepsy and epileptic syndromes with complex partial seizures, not intractable, without status epilepticus: Secondary | ICD-10-CM

## 2023-12-07 MED ORDER — LEVETIRACETAM 1000 MG PO TABS: 1000.0000 mg | ORAL_TABLET | Freq: Two times a day (BID) | ORAL | 3 refills | Status: DC

## 2023-12-07 NOTE — Telephone Encounter (Signed)
 Copied from CRM [id number]. Topic: Clinical - Prescription Issue >> Dec 07, 2023  9:57 AM Deleta HERO wrote: Reason for CRM: The patient, Ciani states Keppra  medication should have been signed off on Friday, 01/10 by PCP. The patient states she contacted her pharmacy and it has not been received yet. Advised Rx requests may take up to 3 business days to complete. The patient is requesting for PCP to have this signed off by the end of the day due to being completely out of this medication.

## 2023-12-07 NOTE — Telephone Encounter (Signed)
 Requested Prescriptions  Pending Prescriptions Disp Refills   levETIRAcetam  (KEPPRA ) 1000 MG tablet 60 tablet 3    Sig: Take 1 tablet (1,000 mg total) by mouth 2 (two) times daily.     Neurology:  Anticonvulsants - levetiracetam  Failed - 12/07/2023  2:42 PM      Failed - Valid encounter within last 12 months    Recent Outpatient Visits           2 years ago Other specified hypothyroidism   [facility] Pickard, Butler DASEN, MD   2 years ago Bipolar I disorder [facility])   [facility] Pickard, Butler DASEN, MD   3 years ago Other specified hypothyroidism   [facility] Medicine [name], MD   4 years ago Chest pain due to gastrointestinal reflux disease   [facility] [name], MD   5 years ago Hypothyroidism, unspecified type   [facility] Pickard, Butler DASEN, MD       Future Appointments             In 3 weeks Pickard, Butler DASEN, MD [facility]            Passed - Cr in normal range and within 360 days    Creat  Date Value Ref Range Status  10/20/2023 0.94 0.50 - 1.05 mg/dL Final   Creatinine,U  Date Value Ref Range Status  07/08/2010 111.9 mg/dL Final    Comment:    See lab report for associated comment(s)         Passed - Completed PHQ-2 or PHQ-9 in the last 360 days

## 2023-12-23 ENCOUNTER — Other Ambulatory Visit: Payer: Self-pay | Admitting: Family Medicine

## 2023-12-23 DIAGNOSIS — K219 Gastro-esophageal reflux disease without esophagitis: Secondary | ICD-10-CM

## 2023-12-23 DIAGNOSIS — K581 Irritable bowel syndrome with constipation: Secondary | ICD-10-CM

## 2023-12-23 NOTE — Telephone Encounter (Signed)
Requested medication (s) are due for refill today: No  Requested medication (s) are on the active medication list: Yes  Last refill:  12/23/23  Future visit scheduled: Yes  Notes to clinic:  Unable to refill per protocol, cannot delegate. Unable to refill per protocol, last refill by another provider.      Requested Prescriptions  Pending Prescriptions Disp Refills   clonazePAM (KLONOPIN) 0.5 MG tablet 30 tablet 0    Sig: Take 1 tablet (0.5 mg total) by mouth 2 (two) times daily as needed for anxiety.     Not Delegated - Psychiatry: Anxiolytics/Hypnotics 2 Failed - 12/23/2023  1:10 PM      Failed - This refill cannot be delegated      Failed - Valid encounter within last 6 months    Recent Outpatient Visits           2 years ago Other specified hypothyroidism   [facility] Pickard, Priscille Heidelberg, MD   2 years ago Bipolar I disorder (HCC)   [facility] Pickard, Priscille Heidelberg, MD   3 years ago Other specified hypothyroidism   [facility] Donita Diclemente, MD   5 years ago Chest pain due to gastrointestinal reflux disease   [facility] Donita Osbourne, MD   5 years ago Hypothyroidism, unspecified type   [facility] Pickard, Priscille Heidelberg, MD       Future Appointments             In 5 days Donita Klonowski, MD [facility]            Passed - Urine Drug Screen completed in last 360 days      Passed - Patient is not pregnant      Signed Prescriptions Disp Refills   clonazePAM (KLONOPIN) 0.5 MG tablet      Sig: Take 0.5 mg by mouth 2 (two) times daily as needed for anxiety.     There is no refill protocol information for this order    Refused Prescriptions Disp Refills   omeprazole (PRILOSEC) 40 MG capsule 60 capsule 3    Sig: Take 1 capsule (40 mg total) by mouth in the morning and at bedtime.     Gastroenterology: Proton Pump Inhibitors Failed - 12/23/2023   1:10 PM      Failed - Valid encounter within last 12 months    Recent Outpatient Visits           2 years ago Other specified hypothyroidism   [facility] Medicine Tanya Nones, Priscille Heidelberg, MD   2 years ago Bipolar I disorder [facility])   [facility] Pickard, Priscille Heidelberg, MD   3 years ago Other specified hypothyroidism   [facility] Medicine Donita Wrobleski, MD   5 years ago Chest pain due to gastrointestinal reflux disease   [facility] Medicine Donita Kauth, MD   5 years ago Hypothyroidism, unspecified type   [facility] Pickard, Priscille Heidelberg, MD       Future Appointments             In 5 days Pickard, Priscille Heidelberg, MD [facility]

## 2023-12-23 NOTE — Telephone Encounter (Signed)
Copied from CRM [id number]. Topic: Clinical - Medication Refill >> Dec 23, 2023  9:58 AM Elle L wrote: Most Recent Primary Care Visit:  Provider: Lynnea Ferrier T  [facility]  Visit Type: MYCHART VIDEO VISIT  Date: 11/27/2023  Medication: omeprazole (PRILOSEC) 40 MG capsule  Has the patient contacted their pharmacy? Yes  Is this the correct pharmacy for this prescription? Yes If no, delete pharmacy and type the correct one.  This is the patient's preferred pharmacy:  [facility] [place], [place] - [id number] [place] [address] Phone: [phone] Fax: [phone]  Has the prescription been filled recently? No  Is the patient out of the medication? Yes  Has the patient been seen for an appointment in the last year OR does the patient have an upcoming appointment? Yes  Can we respond through MyChart? No  Agent: Please be advised that Rx refills may take up to 3 business days. We ask that you follow-up with your pharmacy.

## 2023-12-23 NOTE — Telephone Encounter (Signed)
Requested medication (s) are due for refill today: No  Requested medication (s) are on the active medication list: Yes  Last refill:  12/23/23  Future visit scheduled: Yes  Notes to clinic:  Unable to refill per protocol, cannot delegate. Unable to refill per protocol, last refill by another provider.      Requested Prescriptions  Pending Prescriptions Disp Refills   clonazePAM (KLONOPIN) 0.5 MG tablet 30 tablet 0    Sig: Take 1 tablet (0.5 mg total) by mouth 2 (two) times daily as needed for anxiety.     Not Delegated - Psychiatry: Anxiolytics/Hypnotics 2 Failed - 12/23/2023 12:15 PM      Failed - This refill cannot be delegated      Failed - Valid encounter within last 6 months    Recent Outpatient Visits           2 years ago Other specified hypothyroidism   [facility] Pickard, Priscille Heidelberg, MD   2 years ago Bipolar I disorder (HCC)   [facility] Pickard, Priscille Heidelberg, MD   3 years ago Other specified hypothyroidism   [facility] Medicine Donita Arena, MD   5 years ago Chest pain due to gastrointestinal reflux disease   [facility] Donita Robideau, MD   5 years ago Hypothyroidism, unspecified type   [facility] Pickard, Priscille Heidelberg, MD       Future Appointments             In 5 days Donita Herzig, MD [facility]            Passed - Urine Drug Screen completed in last 360 days      Passed - Patient is not pregnant      Signed Prescriptions Disp Refills   clonazePAM (KLONOPIN) 0.5 MG tablet      Sig: Take 0.5 mg by mouth 2 (two) times daily as needed for anxiety.     There is no refill protocol information for this order    Refused Prescriptions Disp Refills   omeprazole (PRILOSEC) 40 MG capsule 60 capsule 3    Sig: Take 1 capsule (40 mg total) by mouth in the morning and at bedtime.     Gastroenterology: Proton Pump Inhibitors Failed - 12/23/2023  12:15 PM      Failed - Valid encounter within last 12 months    Recent Outpatient Visits           2 years ago Other specified hypothyroidism   [facility] Tanya Nones, Priscille Heidelberg, MD   2 years ago Bipolar I disorder [facility])   [facility] Pickard, Priscille Heidelberg, MD   3 years ago Other specified hypothyroidism   [facility] Medicine Donita Rexroad, MD   5 years ago Chest pain due to gastrointestinal reflux disease   [facility] Medicine Donita Renfro, MD   5 years ago Hypothyroidism, unspecified type   [facility] Medicine Pickard, Priscille Heidelberg, MD       Future Appointments             In 5 days Pickard, Priscille Heidelberg, MD [facility]

## 2023-12-23 NOTE — Telephone Encounter (Signed)
Requested Rx too soon. Last filled 12/01/23 Requested Prescriptions  Pending Prescriptions Disp Refills   omeprazole (PRILOSEC) 40 MG capsule 60 capsule 3    Sig: Take 1 capsule (40 mg total) by mouth in the morning and at bedtime.     Gastroenterology: Proton Pump Inhibitors Failed - 12/23/2023 12:12 PM      Failed - Valid encounter within last 12 months    Recent Outpatient Visits           2 years ago Other specified hypothyroidism   [facility] Pickard, Priscille Heidelberg, MD   2 years ago Bipolar I disorder [facility])   [facility] Pickard, Priscille Heidelberg, MD   3 years ago Other specified hypothyroidism   [facility] Medicine Donita Macomber, MD   5 years ago Chest pain due to gastrointestinal reflux disease   [facility] Medicine Donita Buzzelli, MD   5 years ago Hypothyroidism, unspecified type   [facility] Medicine Donita Levay, MD       Future Appointments             In 5 days Donita Finkle, MD [facility]             clonazePAM (KLONOPIN) 0.5 MG tablet 30 tablet 0    Sig: Take 1 tablet (0.5 mg total) by mouth 2 (two) times daily as needed for anxiety.     Not Delegated - Psychiatry: Anxiolytics/Hypnotics 2 Failed - 12/23/2023 12:12 PM      Failed - This refill cannot be delegated      Failed - Valid encounter within last 6 months    Recent Outpatient Visits           2 years ago Other specified hypothyroidism   [facility] Pickard, Priscille Heidelberg, MD   2 years ago Bipolar I disorder (HCC)   [facility] Medicine Pickard, Priscille Heidelberg, MD   3 years ago Other specified hypothyroidism   [facility] Medicine Donita Mccormac, MD   5 years ago Chest pain due to gastrointestinal reflux disease   [facility] Donita Bossard, MD   5 years ago Hypothyroidism, unspecified type   [facility] Medicine Pickard, Priscille Heidelberg, MD        Future Appointments             In 5 days Donita Mccown, MD [facility]            Passed - Urine Drug Screen completed in last 360 days      Passed - Patient is not pregnant      Signed Prescriptions Disp Refills   clonazePAM (KLONOPIN) 0.5 MG tablet      Sig: Take 0.5 mg by mouth 2 (two) times daily as needed for anxiety.     There is no refill protocol information for this order

## 2023-12-23 NOTE — Telephone Encounter (Signed)
Copied from CRM [id number]. Topic: Clinical - Medication Refill >> Dec 23, 2023 12:35 PM Ivette P wrote: Most Recent Primary Care Visit:  Provider: Lynnea Ferrier T  [facility]  Visit Type: MYCHART VIDEO VISIT  Date: 11/27/2023  Medication: clonazePAM (KLONOPIN) 0.5 MG tablet  Has the patient contacted their pharmacy? Yes (Agent: If no, request that the patient contact the pharmacy for the refill. If patient does not wish to contact the pharmacy document the reason why and proceed with request.) (Agent: If yes, when and what did the pharmacy advise?)  Is this the correct pharmacy for this prescription? Yes If no, delete pharmacy and type the correct one.  This is the patient's preferred pharmacy:  [facility] [place], [place] - [id number] [place] [address] Phone: [phone] Fax: [phone]   Has the prescription been filled recently? No  Is the patient out of the medication? Yes  Has the patient been seen for an appointment in the last year OR does the patient have an upcoming appointment? Yes  Can we respond through MyChart? No  Agent: Please be advised that Rx refills may take up to 3 business days. We ask that you follow-up with your pharmacy.

## 2023-12-23 NOTE — Telephone Encounter (Signed)
Copied from CRM [id number]. Topic: Clinical - Medication Refill >> Dec 23, 2023 10:02 AM Elle L wrote: Most Recent Primary Care Visit:  Provider: Lynnea Ferrier T  [facility]  Visit Type: MYCHART VIDEO VISIT  Date: 11/27/2023  Medication: Klonopin .5 MG   Has the patient contacted their pharmacy? Yes advised to contact provider.  Is this the correct pharmacy for this prescription? Yes If no, delete pharmacy and type the correct one.  This is the patient's preferred pharmacy:  [facility] [place], [place] - [id number] [place] [address] Phone: [phone] Fax: [phone]   Has the prescription been filled recently? No  Is the patient out of the medication? Yes  Has the patient been seen for an appointment in the last year OR does the patient have an upcoming appointment? Yes  Can we respond through MyChart? No  Agent: Please be advised that Rx refills may take up to 3 business days. We ask that you follow-up with your pharmacy.

## 2023-12-24 MED ORDER — CLONAZEPAM 0.5 MG PO TABS: 0.5000 mg | ORAL_TABLET | Freq: Two times a day (BID) | ORAL | 0 refills | Status: DC | PRN

## 2023-12-25 ENCOUNTER — Telehealth: Payer: Self-pay

## 2023-12-25 ENCOUNTER — Other Ambulatory Visit: Payer: Self-pay

## 2023-12-25 DIAGNOSIS — R29898 Other symptoms and signs involving the musculoskeletal system: Secondary | ICD-10-CM

## 2023-12-25 DIAGNOSIS — R269 Unspecified abnormalities of gait and mobility: Secondary | ICD-10-CM

## 2023-12-25 DIAGNOSIS — M6281 Muscle weakness (generalized): Secondary | ICD-10-CM

## 2023-12-25 DIAGNOSIS — R531 Weakness: Secondary | ICD-10-CM

## 2023-12-25 DIAGNOSIS — I639 Cerebral infarction, unspecified: Secondary | ICD-10-CM

## 2023-12-25 NOTE — Telephone Encounter (Signed)
Copied from CRM [id number]. Topic: Referral - Request for Referral >> Dec 25, 2023  9:44 AM Elle L wrote: Did the patient discuss referral with their provider in the last year? Yes (If No - schedule appointment) (If Yes - send message)  Appointment offered? No  Type of order/referral and detailed reason for visit: Physical Therapy  Preference of office, provider, location: [facility]   If referral order, have you been seen by this specialty before? Yes When she broke her arm she had one. She needs one now to help with her walking.   Can we respond through MyChart? No, please call her at [phone].

## 2023-12-28 ENCOUNTER — Ambulatory Visit: Payer: MEDICAID | Admitting: Family Medicine

## 2023-12-29 ENCOUNTER — Telehealth: Payer: Self-pay

## 2023-12-29 NOTE — Telephone Encounter (Signed)
 Copied from CRM [id number]. Topic: General - Other >> Dec 29, 2023  2:06 PM Sheryl Hall wrote: Reason for CRM: [facility] received the change of status/reconsideration form for personal care services eval and it is missing the patient's address, the provider npi and practice npi. Asking if the form can be refaxed to [phone] as soon as possible.

## 2023-12-30 ENCOUNTER — Telehealth: Payer: Self-pay

## 2023-12-30 NOTE — Telephone Encounter (Signed)
 Case Manager: Howell Macintosh with [name]. Forms from [employer], need to be updated with NPI for Dr. Cheril Cork and Office. Forms updated and faxed to [phone]. Mjp,lpn

## 2024-01-11 ENCOUNTER — Telehealth: Payer: Self-pay

## 2024-01-11 NOTE — Telephone Encounter (Signed)
Copied from CRM [id number]. Topic: Clinical - Home Health Verbal Orders >> Jan 11, 2024  2:07 PM Gery Pray wrote: [facility] Callback Number: [phone] ext [phone] Service Requested: Personal Care Assistant Frequency: 8 hours a month for transportation from and to her appointments Any new concerns about the patient? No  Faxing another copy of Request for Independent assessment for personal care services and Attestation of Medical Needs Form from [facility] ATTN: Otelia Sergeant

## 2024-01-28 ENCOUNTER — Telehealth: Payer: Self-pay | Admitting: Family Medicine

## 2024-01-28 NOTE — Telephone Encounter (Unsigned)
 Copied from CRM [id number]. Topic: General - Other >> Jan 28, 2024 10:59 AM Gildardo Pounds wrote: Reason for CRM: Denisha with [facility] needs to decline due to staffing. Callback number is [phone]

## 2024-01-28 NOTE — Telephone Encounter (Signed)
 Copied from CRM [id number]. Topic: General - Other >> Jan 28, 2024  2:57 PM Clide Dales wrote: Tia with [facility] called and said that patient was given a script for a 3i 1 shower chair in July 2024. Patient never got the script filled but would now to have the shower chair. Tia is requesting that an order for a 3in1 shower chair be faxed to [place] [phone]. Tia said that you can contact her with any questions at [phone] ext.[phone].

## 2024-01-29 ENCOUNTER — Telehealth: Payer: Self-pay

## 2024-01-29 NOTE — Telephone Encounter (Signed)
 Copied from CRM [id number]. Topic: General - Other >> Jan 29, 2024  9:28 AM Higinio Roger wrote: Reason for CRM: Please refax demographics, prescription for shower chair, and diagnosis code to [facility]. Angelica Chessman stated she is unable to read it.   Fax: [phone] Callback #: [phone]

## 2024-02-05 ENCOUNTER — Telehealth: Payer: Self-pay

## 2024-02-05 NOTE — Telephone Encounter (Signed)
 Copied from CRM [id number]. Topic: Referral - Status >> Feb 05, 2024  9:23 AM Shon Hale wrote: Reason for CRM: Sister is requesting physical therapy for patient. Requesting referral from Dr. Tanya Nones.   Sister is currently taking care of patient as well as mom and brother and is needing for patient to have the physical therapy as soon as possible.   Please assist patient further

## 2024-02-08 ENCOUNTER — Other Ambulatory Visit: Payer: Self-pay | Admitting: Family Medicine

## 2024-02-08 DIAGNOSIS — R531 Weakness: Secondary | ICD-10-CM

## 2024-02-09 ENCOUNTER — Other Ambulatory Visit: Payer: Self-pay | Admitting: Family Medicine

## 2024-02-10 NOTE — Telephone Encounter (Signed)
 Requested medication (s) are due for refill today: yes  Requested medication (s) are on the active medication list: yes  Last refill:  10/27/23 #60/3  Future visit scheduled: no  Notes to clinic:  Unable to refill per protocol, cannot delegate.      Requested Prescriptions  Pending Prescriptions Disp Refills   pregabalin (LYRICA) 25 MG capsule [Pharmacy Med Name: pregabalin 25 mg capsule] 60 capsule 3    Sig: Take 1 capsule (25 mg total) by mouth 2 (two) times daily.     Not Delegated - Neurology:  Anticonvulsants - Controlled - pregabalin Failed - 02/10/2024 12:02 PM      Failed - This refill cannot be delegated      Failed - Valid encounter within last 12 months    Recent Outpatient Visits           2 years ago Other specified hypothyroidism   [facility] Pickard, Priscille Heidelberg, MD   3 years ago Bipolar I disorder [facility])   [facility] Pickard, Priscille Heidelberg, MD   3 years ago Other specified hypothyroidism   [facility] Donita Olvera, MD   5 years ago Chest pain due to gastrointestinal reflux disease   [facility] Tanya Nones, Priscille Heidelberg, MD   5 years ago Hypothyroidism, unspecified type   [facility] Pickard, Priscille Heidelberg, MD              Passed - Cr in normal range and within 360 days    Creat  Date Value Ref Range Status  10/20/2023 0.94 0.50 - 1.05 mg/dL Final   Creatinine,U  Date Value Ref Range Status  07/08/2010 111.9 mg/dL Final    Comment:    See lab report for associated comment(s)         Passed - Completed PHQ-2 or PHQ-9 in the last 360 days

## 2024-02-16 ENCOUNTER — Other Ambulatory Visit: Payer: Self-pay | Admitting: Family Medicine

## 2024-02-17 NOTE — Telephone Encounter (Signed)
 Requested medication (s) are due for refill today - no  Requested medication (s) are on the active medication list no  Future visit scheduled no  Last refill: unsure  Notes to clinic: medication no longer listed on current medication list  Requested Prescriptions  Pending Prescriptions Disp Refills   cyanocobalamin (VITAMIN B12) 1000 MCG/ML injection [Pharmacy Med Name: cyanocobalamin (vit B-12) 1,000 mcg/mL injection solution] 1 mL 0    Sig: Inject 1 mL (1,000 mcg total) into the muscle once for 1 dose.     Endocrinology:  Vitamins - Vitamin B12 Passed - 02/17/2024  2:33 PM      Passed - HCT in normal range and within 360 days    HCT  Date Value Ref Range Status  10/05/2023 39.3 36.0 - 46.0 % Final  09/18/2011 45 % Final   Hematocrit  Date Value Ref Range Status  08/25/2016 39.9 34.0 - 46.6 % Final         Passed - HGB in normal range and within 360 days    Hemoglobin  Date Value Ref Range Status  10/05/2023 12.1 12.0 - 15.0 g/dL Final  16/08/9603 54.0 11.1 - 15.9 g/dL Final         Passed - B12 Level in normal range and within 360 days    Vitamin B-12  Date Value Ref Range Status  10/20/2023 278 200 - 1,100 pg/mL Final    Comment:    . Please Note: Although the reference range for vitamin B12 is [phone] pg/mL, it has been reported that between 5 and 10% of patients with values between 200 and 400 pg/mL may experience neuropsychiatric and hematologic abnormalities due to occult B12 deficiency; less than 1% of patients with values above 400 pg/mL will have symptoms. Verna Czech - Valid encounter within last 12 months    Recent Outpatient Visits           2 months ago Bipolar depression [facility])   [place] [facility] Pickard, Priscille Heidelberg, MD   4 months ago Muscle weakness   [place] [facility] [place] Me[place], FNP   4 months ago Bipolar depression [facility])   [place] [facility] Donita Warbington, MD    6 months ago Bipolar depression [facility])   [place] [facility] Donita Magill, MD   7 months ago B12 deficiency   [place] [facility] Pickard, Priscille Heidelberg, MD                 Requested Prescriptions  Pending Prescriptions Disp Refills   cyanocobalamin (VITAMIN B12) 1000 MCG/ML injection [Pharmacy Med Name: cyanocobalamin (vit B-12) 1,000 mcg/mL injection solution] 1 mL 0    Sig: Inject 1 mL (1,000 mcg total) into the muscle once for 1 dose.     Endocrinology:  Vitamins - Vitamin B12 Passed - 02/17/2024  2:33 PM      Passed - HCT in normal range and within 360 days    HCT  Date Value Ref Range Status  10/05/2023 39.3 36.0 - 46.0 % Final  09/18/2011 45 % Final   Hematocrit  Date Value Ref Range Status  08/25/2016 39.9 34.0 - 46.6 % Final         Passed - HGB in normal range and within 360 days    Hemoglobin  Date Value Ref Range Status  10/05/2023 12.1 12.0 - 15.0  g/dL Final  13/06/6577 46.9 11.1 - 15.9 g/dL Final         Passed - B12 Level in normal range and within 360 days    Vitamin B-12  Date Value Ref Range Status  10/20/2023 278 200 - 1,100 pg/mL Final    Comment:    . Please Note: Although the reference range for vitamin B12 is [phone] pg/mL, it has been reported that between 5 and 10% of patients with values between 200 and 400 pg/mL may experience neuropsychiatric and hematologic abnormalities due to occult B12 deficiency; less than 1% of patients with values above 400 pg/mL will have symptoms. Verna Czech - Valid encounter within last 12 months    Recent Outpatient Visits           2 months ago Bipolar depression [facility])   [place] [facility] Pickard, Priscille Heidelberg, MD   4 months ago Muscle weakness   [place] [facility] Medicine [place] Me[place], FNP   4 months ago Bipolar depression [facility])   [place] [facility] Donita Bogden, MD   6 months ago  Bipolar depression [facility])   [place] [facility] Medicine Donita Mallery, MD   7 months ago B12 deficiency   [place] [facility] Medicine Pickard, Priscille Heidelberg, MD

## 2024-02-29 ENCOUNTER — Telehealth: Payer: Self-pay | Admitting: Family Medicine

## 2024-02-29 NOTE — Telephone Encounter (Signed)
 Copied from CRM [id number]. Topic: Clinical - Medical Advice >> Feb 26, 2024 12:41 PM Shelah Lewandowsky wrote: Reason for CRM: Asking for physical therapy-  please call [phone]

## 2024-03-01 ENCOUNTER — Telehealth: Payer: Self-pay

## 2024-03-01 ENCOUNTER — Telehealth: Payer: Self-pay | Admitting: Family Medicine

## 2024-03-01 NOTE — Telephone Encounter (Signed)
 Copied from CRM [id number]. Topic: Referral - Request for Referral >> Feb 29, 2024  2:09 PM Higinio Roger wrote: Did the patient discuss referral with their provider in the last year? Yes (If No - schedule appointment) (If Yes - send message)  Appointment offered? No  Type of order/referral and detailed reason for visit: Patient would like to go and stay at a facility and do Physical Therapy  Preference of office, provider, location: [place] or [facility] area  If referral order, have you been seen by this specialty before? No (If Yes, this issue or another issue? When? Where?  Can we respond through MyChart? Yes >> Mar 01, 2024 10:22 AM Lilyan Gilford wrote: Created telephone encounter and routed message to provider's nurse.

## 2024-03-01 NOTE — Telephone Encounter (Signed)
 Copied from CRM [id number]. Topic: Referral - Request for Referral >> Feb 29, 2024  2:09 PM Higinio Roger wrote: Did the patient discuss referral with their provider in the last year? Yes (If No - schedule appointment) (If Yes - send message)  Appointment offered? No  Type of order/referral and detailed reason for visit: Patient would like to go and stay at a facility and do Physical Therapy  Preference of office, provider, location: [place] or [facility] area  If referral order, have you been seen by this specialty before? No (If Yes, this issue or another issue? When? Where?  Can we respond through MyChart? Yes

## 2024-03-03 ENCOUNTER — Ambulatory Visit: Payer: MEDICAID | Admitting: Family Medicine

## 2024-03-03 ENCOUNTER — Encounter: Payer: Self-pay | Admitting: Family Medicine

## 2024-03-03 VITALS — BP 136/80 | HR 84 | Temp 98.1°F | Ht 66.0 in | Wt 185.0 lb

## 2024-03-03 DIAGNOSIS — E538 Deficiency of other specified B group vitamins: Secondary | ICD-10-CM

## 2024-03-03 DIAGNOSIS — R531 Weakness: Secondary | ICD-10-CM | POA: Diagnosis not present

## 2024-03-03 DIAGNOSIS — E039 Hypothyroidism, unspecified: Secondary | ICD-10-CM | POA: Diagnosis not present

## 2024-03-03 DIAGNOSIS — F319 Bipolar disorder, unspecified: Secondary | ICD-10-CM

## 2024-03-03 MED ORDER — CYANOCOBALAMIN 1000 MCG/ML IJ SOLN
1000.0000 ug | Freq: Once | INTRAMUSCULAR | Status: AC
  Administered 2024-03-03: 1000 ug via INTRAMUSCULAR

## 2024-03-03 NOTE — Progress Notes (Signed)
 Subjective:    Patient ID: Sheryl Hall, female    DOB: May 04, 1961, 63 y.o.   MRN: [id number]  HPI Patient is now seeing a psychiatrist who is managing her bipolar disorder with Abilify, Lexapro, and Lamictal.  Patient states that she is doing relatively well.  However she lives at home alone.  She seldom gets up and walks if she is distally confined to wheelchair.  Due to the deconditioning, her legs are very weak.  She has difficult time standing.  She reports lack of mobility.  We have tried to arrange physical therapy however we could not find a home health physical therapy agency that would accept her into the patient denied the ability to be transported to a facility.  She now request that I have her admitted to a nursing facility for short-term rehab Past Medical History:  Diagnosis Date   Anxiety    Arthritis    Avascular necrosis of hip (HCC)    left   Bipolar 1 disorder (HCC)    Chronic left hip pain    Chronic nausea    Depression    Diverticulosis of colon    Family hx of colon cancer    age 63-father   Folate deficiency    GERD (gastroesophageal reflux disease)    Headache    Heart murmur    Hx of abnormal Pap smear    Hyperlipidemia    Hypothyroidism    Keratosis, actinic    Low back pain 03/01/2013   MRI with multiple levels of disc bulge   Panic attacks    Polysubstance abuse (HCC)    PTSD (post-traumatic stress disorder)    S/P colonoscopy 05/30/2010   LAX sphincter tone, anal papilla, left-sided diverticulosis, normal random biopsies,, 1 polyp-TA   S/P endoscopy 05/30/2010   Dr Rourk-> non--critical Schatzki's ring, s/p 1F dilation   Seizures (HCC)    Stroke (HCC) 2014   Right parietal, no deficits    Tubular adenoma of colon 05/30/2010   Next colonoscopy 05/2015   UTI (urinary tract infection)    Past Surgical History:  Procedure Laterality Date   COLONOSCOPY     every 5 years   COLONOSCOPY  05/30/2010   RMR:lax anal sphincter tone,anal  papilla,otherwise normal/left-sided diverticula   ESOPHAGOGASTRODUODENOSCOPY  05/30/2010   WGN:FAOZHYQMVHQ'I ring/small HH otherwise normal   EXTERNAL EAR SURGERY Left 12 years ago   skin graft from behind ear put in ear canal   JOINT REPLACEMENT     NECK SURGERY  10 years ago   SKIN CANCER DESTRUCTION     TOTAL HIP ARTHROPLASTY Left 04/19/2014   Procedure: LEFT TOTAL HIP ARTHROPLASTY ANTERIOR APPROACH;  Surgeon: Loanne Drilling, MD;  Location: [facility];  Service: Orthopedics;  Laterality: Left;   TOTAL HIP ARTHROPLASTY Right 05/23/2015   Procedure: RIGHT TOTAL HIP ARTHROPLASTY ANTERIOR APPROACH;  Surgeon: Ollen Gross, MD;  Location: [facility];  Service: Orthopedics;  Laterality: Right;   Current Outpatient Medications on File Prior to Visit  Medication Sig Dispense Refill   ARIPiprazole (ABILIFY) 2 MG tablet Take 1 tablet (2 mg total) by mouth at bedtime. 30 tablet 1   escitalopram (LEXAPRO) 10 MG tablet Take 1 tablet (10 mg total) by mouth daily. 30 tablet 1   lamoTRIgine (LAMICTAL) 25 MG tablet Take 1 tablet (25 mg total) by mouth 2 (two) times daily. 60 tablet 1   levETIRAcetam (KEPPRA) 1000 MG tablet Take 1 tablet (1,000 mg total) by mouth 2 (two) times daily.  60 tablet 3   levothyroxine (SYNTHROID) 50 MCG tablet Take 1 tablet (50 mcg total) by mouth daily. 30 tablet 3   melatonin 3 MG TABS tablet Take 2 tablets (6 mg total) by mouth at bedtime. (Patient taking differently: Take 3 mg by mouth at bedtime.) 60 tablet 3   omeprazole (PRILOSEC) 40 MG capsule Take 1 capsule (40 mg total) by mouth in the morning and at bedtime. 60 capsule 3   pregabalin (LYRICA) 25 MG capsule Take 1 capsule (25 mg total) by mouth 2 (two) times daily. 60 capsule 3   traZODone (DESYREL) 50 MG tablet Take 1 tablet (50 mg total) by mouth at bedtime. 30 tablet 1   clonazePAM (KLONOPIN) 0.5 MG tablet Take 1 tablet (0.5 mg total) by mouth 2 (two) times daily as needed for anxiety. (Patient not taking: Reported on  03/03/2024) 30 tablet 0   furosemide (LASIX) 20 MG tablet Take 1 tablet (20 mg total) by mouth daily as needed for edema. (Patient not taking: Reported on 03/03/2024) 15 tablet 0   [DISCONTINUED] LINZESS 72 MCG capsule TAKE ONE CAPSULE ORALLY EVERY MORNING BEFORE BREAKFAST. (Patient taking differently: Take 72 mcg by mouth daily before breakfast. ) 30 capsule 1   No current facility-administered medications on file prior to visit.   No Known Allergies Social History   Socioeconomic History   Marital status: Divorced    Spouse name: Not on file   Number of children: 1   Years of education: Not on file   Highest education level: Not on file  Occupational History   Occupation: disabled  Tobacco Use   Smoking status: Former    Current packs/day: 0.00    Average packs/day: 0.5 packs/day for 30.0 years (15.0 ttl pk-yrs)    Types: Cigarettes    Start date: 04/24/1992    Quit date: 04/24/2022    Years since quitting: 1.8   Smokeless tobacco: Never   Tobacco comments:    trying to quit  Vaping Use   Vaping status: Never Used  Substance and Sexual Activity   Alcohol use: No    Alcohol/week: 0.0 standard drinks of alcohol   Drug use: No    Comment: hx cocaine abuse   Sexual activity: Never    Birth control/protection: None  Other Topics Concern   Not on file  Social History Narrative   Patient lives at home alone and she is single.    Disabled.   Education college education.   Right handed.   Caffeine mountain dew four daily.    Social Drivers of Corporate investment banker Strain: Not on file  Food Insecurity: No Food Insecurity (09/18/2023)   Hunger Vital Sign    Worried About Running Out of Food in the Last Year: Never true    Ran Out of Food in the Last Year: Never true  Transportation Needs: Unmet Transportation Needs (09/18/2023)   PRAPARE - Administrator, Civil Service (Medical): Yes    Lack of Transportation (Non-Medical): Yes  Physical Activity: Not on  file  Stress: Not on file  Social Connections: Not on file  Intimate Partner Violence: Not At Risk (03/03/2023)   Humiliation, Afraid, Rape, and Kick questionnaire    Fear of Current or Ex-Partner: No    Emotionally Abused: No    Physically Abused: No    Sexually Abused: No      Review of Systems  Psychiatric/Behavioral:  Positive for decreased concentration and dysphoric mood. Negative for behavioral  problems, confusion, hallucinations, self-injury, sleep disturbance and suicidal ideas. The patient is nervous/anxious and is hyperactive.        Objective:   Physical Exam Constitutional:      Appearance: She is obese.  Cardiovascular:     Rate and Rhythm: Normal rate and regular rhythm.     Pulses: Normal pulses.     Heart sounds: Normal heart sounds.  Pulmonary:     Effort: Pulmonary effort is normal.     Breath sounds: Normal breath sounds.  Neurological:     Mental Status: She is alert and oriented to person, place, and time.  Psychiatric:        Behavior: Behavior normal.        Thought Content: Thought content normal.        Judgment: Judgment normal.           Assessment & Plan:  Hypothyroidism, unspecified type - Plan: CBC with Differential/Platelet, COMPLETE METABOLIC PANEL WITHOUT GFR, TSH  Weakness generalized  Bipolar depression (HCC) I believe the patient would benefit from formal physical therapy as an outpatient.  However she insist on being admitted to a short-term rehab facility.  Therefore I provided her the names of several facilities in the area that she and her daughter can contact to check on bed availability.  Once she has decided on the facility, I will be happy to complete the [place] 2 form.  Her weakness is due to a combination of deconditioning coupled with peripheral neuropathy.  I believe the majority is now secondary to deconditioning.  While the patient is here today I will check baseline lab work including a CBC CMP and a TSH

## 2024-03-03 NOTE — Addendum Note (Signed)
 Addended by: Venia Carbon K on: 03/03/2024 03:47 PM   Modules accepted: Orders

## 2024-03-04 LAB — CBC WITH DIFFERENTIAL/PLATELET
Absolute Lymphocytes: 1357 {cells}/uL (ref 850–3900)
Absolute Monocytes: 410 {cells}/uL (ref 200–950)
Basophils Absolute: 32 {cells}/uL (ref 0–200)
Basophils Relative: 0.5 %
Eosinophils Absolute: 58 {cells}/uL (ref 15–500)
Eosinophils Relative: 0.9 %
HCT: 40.6 % (ref 35.0–45.0)
Hemoglobin: 12.3 g/dL (ref 11.7–15.5)
MCH: 23 pg — ABNORMAL LOW (ref 27.0–33.0)
MCHC: 30.3 g/dL — ABNORMAL LOW (ref 32.0–36.0)
MCV: 75.9 fL — ABNORMAL LOW (ref 80.0–100.0)
MPV: 12.4 fL (ref 7.5–12.5)
Monocytes Relative: 6.4 %
Neutro Abs: 4544 {cells}/uL (ref 1500–7800)
Neutrophils Relative %: 71 %
Platelets: 247 10*3/uL (ref 140–400)
RBC: 5.35 10*6/uL — ABNORMAL HIGH (ref 3.80–5.10)
RDW: 16 % — ABNORMAL HIGH (ref 11.0–15.0)
Total Lymphocyte: 21.2 %
WBC: 6.4 10*3/uL (ref 3.8–10.8)

## 2024-03-04 LAB — COMPLETE METABOLIC PANEL WITHOUT GFR
AG Ratio: 1.7 (calc) (ref 1.0–2.5)
ALT: 6 U/L (ref 6–29)
AST: 11 U/L (ref 10–35)
Albumin: 4 g/dL (ref 3.6–5.1)
Alkaline phosphatase (APISO): 97 U/L (ref 37–153)
BUN: 7 mg/dL (ref 7–25)
CO2: 27 mmol/L (ref 20–32)
Calcium: 8.9 mg/dL (ref 8.6–10.4)
Chloride: 104 mmol/L (ref 98–110)
Creat: 1.03 mg/dL (ref 0.50–1.05)
Globulin: 2.4 g/dL (ref 1.9–3.7)
Glucose, Bld: 87 mg/dL (ref 65–99)
Potassium: 4.2 mmol/L (ref 3.5–5.3)
Sodium: 140 mmol/L (ref 135–146)
Total Bilirubin: 0.4 mg/dL (ref 0.2–1.2)
Total Protein: 6.4 g/dL (ref 6.1–8.1)

## 2024-03-04 LAB — TSH: TSH: 0.79 m[IU]/L (ref 0.40–4.50)

## 2024-03-09 ENCOUNTER — Other Ambulatory Visit: Payer: Self-pay | Admitting: Family Medicine

## 2024-03-09 DIAGNOSIS — E0590 Thyrotoxicosis, unspecified without thyrotoxic crisis or storm: Secondary | ICD-10-CM

## 2024-03-10 NOTE — Telephone Encounter (Signed)
 Requested Prescriptions  Pending Prescriptions Disp Refills   levothyroxine (SYNTHROID) 50 MCG tablet [Pharmacy Med Name: levothyroxine 50 mcg tablet] 90 tablet 3    Sig: TAKE ONE TABLET BY MOUTH EVERY DAY.     Endocrinology:  Hypothyroid Agents Passed - 03/10/2024 10:04 AM      Passed - TSH in normal range and within 360 days    TSH  Date Value Ref Range Status  03/03/2024 0.79 0.40 - 4.50 mIU/L Final         Passed - Valid encounter within last 12 months    Recent Outpatient Visits           1 week ago Hypothyroidism, unspecified type   [place] [facility] Austine Lefort, MD   3 months ago Bipolar depression [facility])   [place] [facility] Austine Lefort, MD   4 months ago Muscle weakness   [place] [facility] Jenelle Mis, FNP   5 months ago Bipolar depression [facility])   [facility] Austine Lefort, MD   6 months ago Bipolar depression [facility])   [place] [facility] Pickard, Cisco Crest, MD

## 2024-03-14 ENCOUNTER — Telehealth: Payer: Self-pay

## 2024-03-14 ENCOUNTER — Other Ambulatory Visit: Payer: Self-pay

## 2024-03-14 DIAGNOSIS — R531 Weakness: Secondary | ICD-10-CM

## 2024-03-14 DIAGNOSIS — R269 Unspecified abnormalities of gait and mobility: Secondary | ICD-10-CM

## 2024-03-14 DIAGNOSIS — G40209 Localization-related (focal) (partial) symptomatic epilepsy and epileptic syndromes with complex partial seizures, not intractable, without status epilepticus: Secondary | ICD-10-CM

## 2024-03-14 DIAGNOSIS — R42 Dizziness and giddiness: Secondary | ICD-10-CM

## 2024-03-14 DIAGNOSIS — I639 Cerebral infarction, unspecified: Secondary | ICD-10-CM

## 2024-03-14 DIAGNOSIS — R29898 Other symptoms and signs involving the musculoskeletal system: Secondary | ICD-10-CM

## 2024-03-14 DIAGNOSIS — G6289 Other specified polyneuropathies: Secondary | ICD-10-CM

## 2024-03-14 NOTE — Telephone Encounter (Signed)
 Copied from CRM [id number]. Topic: Clinical - Home Health Verbal Orders >> Mar 14, 2024  1:02 PM Crispin Dolphin wrote: Caller/Agency: [place] with [facility]  Callback Number: [phone] ext [phone] [facility]  / [phone] Gasper Karst - call [facility] with referral or order.  Service Requested: Physical Therapy Home Health through [facility] - [facility] is in network with them.  Frequency: N/A Any new concerns about the patient? N/A

## 2024-03-23 ENCOUNTER — Inpatient Hospital Stay (HOSPITAL_COMMUNITY)
Admission: EM | Admit: 2024-03-23 | Discharge: 2024-03-31 | DRG: 069 | Disposition: A | Discharge status: Home / Self Care | Payer: MEDICAID | Attending: Family Medicine | Admitting: Family Medicine

## 2024-03-23 ENCOUNTER — Emergency Department (HOSPITAL_COMMUNITY): Payer: MEDICAID

## 2024-03-23 ENCOUNTER — Other Ambulatory Visit (HOSPITAL_COMMUNITY): Payer: MEDICAID

## 2024-03-23 ENCOUNTER — Other Ambulatory Visit: Payer: Self-pay

## 2024-03-23 ENCOUNTER — Other Ambulatory Visit: Payer: Self-pay | Admitting: Family Medicine

## 2024-03-23 ENCOUNTER — Encounter (HOSPITAL_COMMUNITY): Payer: Self-pay

## 2024-03-23 DIAGNOSIS — G40209 Localization-related (focal) (partial) symptomatic epilepsy and epileptic syndromes with complex partial seizures, not intractable, without status epilepticus: Secondary | ICD-10-CM

## 2024-03-23 DIAGNOSIS — G6289 Other specified polyneuropathies: Secondary | ICD-10-CM

## 2024-03-23 DIAGNOSIS — R531 Weakness: Secondary | ICD-10-CM

## 2024-03-23 DIAGNOSIS — F32A Depression, unspecified: Secondary | ICD-10-CM | POA: Diagnosis present

## 2024-03-23 DIAGNOSIS — F319 Bipolar disorder, unspecified: Secondary | ICD-10-CM | POA: Diagnosis present

## 2024-03-23 DIAGNOSIS — Z860101 Personal history of adenomatous and serrated colon polyps: Secondary | ICD-10-CM

## 2024-03-23 DIAGNOSIS — R200 Anesthesia of skin: Secondary | ICD-10-CM

## 2024-03-23 DIAGNOSIS — K219 Gastro-esophageal reflux disease without esophagitis: Secondary | ICD-10-CM | POA: Diagnosis present

## 2024-03-23 DIAGNOSIS — G459 Transient cerebral ischemic attack, unspecified: Principal | ICD-10-CM | POA: Diagnosis present

## 2024-03-23 DIAGNOSIS — Z8673 Personal history of transient ischemic attack (TIA), and cerebral infarction without residual deficits: Secondary | ICD-10-CM | POA: Diagnosis not present

## 2024-03-23 DIAGNOSIS — N3281 Overactive bladder: Secondary | ICD-10-CM | POA: Diagnosis not present

## 2024-03-23 DIAGNOSIS — R29898 Other symptoms and signs involving the musculoskeletal system: Principal | ICD-10-CM | POA: Diagnosis present

## 2024-03-23 DIAGNOSIS — Z79899 Other long term (current) drug therapy: Secondary | ICD-10-CM

## 2024-03-23 DIAGNOSIS — E039 Hypothyroidism, unspecified: Secondary | ICD-10-CM | POA: Diagnosis present

## 2024-03-23 DIAGNOSIS — Z800 Family history of malignant neoplasm of digestive organs: Secondary | ICD-10-CM

## 2024-03-23 DIAGNOSIS — Z993 Dependence on wheelchair: Secondary | ICD-10-CM

## 2024-03-23 DIAGNOSIS — K5900 Constipation, unspecified: Secondary | ICD-10-CM | POA: Diagnosis present

## 2024-03-23 DIAGNOSIS — I639 Cerebral infarction, unspecified: Secondary | ICD-10-CM

## 2024-03-23 DIAGNOSIS — R262 Difficulty in walking, not elsewhere classified: Secondary | ICD-10-CM | POA: Diagnosis not present

## 2024-03-23 DIAGNOSIS — F419 Anxiety disorder, unspecified: Secondary | ICD-10-CM | POA: Diagnosis present

## 2024-03-23 DIAGNOSIS — Z85828 Personal history of other malignant neoplasm of skin: Secondary | ICD-10-CM

## 2024-03-23 DIAGNOSIS — R29705 NIHSS score 5: Secondary | ICD-10-CM | POA: Diagnosis present

## 2024-03-23 DIAGNOSIS — F4310 Post-traumatic stress disorder, unspecified: Secondary | ICD-10-CM | POA: Diagnosis present

## 2024-03-23 DIAGNOSIS — G40909 Epilepsy, unspecified, not intractable, without status epilepticus: Secondary | ICD-10-CM | POA: Diagnosis present

## 2024-03-23 DIAGNOSIS — R2990 Unspecified symptoms and signs involving the nervous system: Secondary | ICD-10-CM

## 2024-03-23 DIAGNOSIS — G8194 Hemiplegia, unspecified affecting left nondominant side: Secondary | ICD-10-CM | POA: Diagnosis present

## 2024-03-23 DIAGNOSIS — Z8249 Family history of ischemic heart disease and other diseases of the circulatory system: Secondary | ICD-10-CM

## 2024-03-23 DIAGNOSIS — G4700 Insomnia, unspecified: Secondary | ICD-10-CM | POA: Diagnosis present

## 2024-03-23 DIAGNOSIS — R29818 Other symptoms and signs involving the nervous system: Secondary | ICD-10-CM | POA: Diagnosis present

## 2024-03-23 DIAGNOSIS — Z833 Family history of diabetes mellitus: Secondary | ICD-10-CM

## 2024-03-23 DIAGNOSIS — I10 Essential (primary) hypertension: Secondary | ICD-10-CM | POA: Diagnosis present

## 2024-03-23 DIAGNOSIS — E785 Hyperlipidemia, unspecified: Secondary | ICD-10-CM | POA: Diagnosis present

## 2024-03-23 DIAGNOSIS — Z96643 Presence of artificial hip joint, bilateral: Secondary | ICD-10-CM | POA: Diagnosis present

## 2024-03-23 DIAGNOSIS — Z888 Allergy status to other drugs, medicaments and biological substances status: Secondary | ICD-10-CM

## 2024-03-23 DIAGNOSIS — Z87891 Personal history of nicotine dependence: Secondary | ICD-10-CM

## 2024-03-23 DIAGNOSIS — Z5971 Insufficient health insurance coverage: Secondary | ICD-10-CM

## 2024-03-23 DIAGNOSIS — Z79890 Hormone replacement therapy (postmenopausal): Secondary | ICD-10-CM

## 2024-03-23 DIAGNOSIS — Z7982 Long term (current) use of aspirin: Secondary | ICD-10-CM

## 2024-03-23 DIAGNOSIS — Z823 Family history of stroke: Secondary | ICD-10-CM

## 2024-03-23 DIAGNOSIS — E0590 Thyrotoxicosis, unspecified without thyrotoxic crisis or storm: Secondary | ICD-10-CM | POA: Diagnosis present

## 2024-03-23 LAB — DIFFERENTIAL
Abs Immature Granulocytes: 0.02 10*3/uL (ref 0.00–0.07)
Basophils Absolute: 0 10*3/uL (ref 0.0–0.1)
Basophils Relative: 0 %
Eosinophils Absolute: 0 10*3/uL (ref 0.0–0.5)
Eosinophils Relative: 0 %
Immature Granulocytes: 0 %
Lymphocytes Relative: 8 %
Lymphs Abs: 0.6 10*3/uL — ABNORMAL LOW (ref 0.7–4.0)
Monocytes Absolute: 0.4 10*3/uL (ref 0.1–1.0)
Monocytes Relative: 6 %
Neutro Abs: 6.2 10*3/uL (ref 1.7–7.7)
Neutrophils Relative %: 86 %

## 2024-03-23 LAB — CBC
HCT: 41.9 % (ref 36.0–46.0)
Hemoglobin: 12.3 g/dL (ref 12.0–15.0)
MCH: 22.6 pg — ABNORMAL LOW (ref 26.0–34.0)
MCHC: 29.4 g/dL — ABNORMAL LOW (ref 30.0–36.0)
MCV: 77 fL — ABNORMAL LOW (ref 80.0–100.0)
Platelets: 209 10*3/uL (ref 150–400)
RBC: 5.44 MIL/uL — ABNORMAL HIGH (ref 3.87–5.11)
RDW: 17.4 % — ABNORMAL HIGH (ref 11.5–15.5)
WBC: 7.3 10*3/uL (ref 4.0–10.5)
nRBC: 0 % (ref 0.0–0.2)

## 2024-03-23 LAB — COMPREHENSIVE METABOLIC PANEL WITH GFR
ALT: 11 U/L (ref 0–44)
AST: 17 U/L (ref 15–41)
Albumin: 3.8 g/dL (ref 3.5–5.0)
Alkaline Phosphatase: 109 U/L (ref 38–126)
Anion gap: 11 (ref 5–15)
BUN: 7 mg/dL — ABNORMAL LOW (ref 8–23)
CO2: 25 mmol/L (ref 22–32)
Calcium: 9.1 mg/dL (ref 8.9–10.3)
Chloride: 103 mmol/L (ref 98–111)
Creatinine, Ser: 1.01 mg/dL — ABNORMAL HIGH (ref 0.44–1.00)
GFR, Estimated: >60 mL/min (ref >60)
Glucose, Bld: 115 mg/dL — ABNORMAL HIGH (ref 70–99)
Potassium: 3.8 mmol/L (ref 3.5–5.1)
Sodium: 139 mmol/L (ref 135–145)
Total Bilirubin: 0.9 mg/dL (ref 0.0–1.2)
Total Protein: 6.8 g/dL (ref 6.5–8.1)

## 2024-03-23 LAB — HEMOGLOBIN A1C
Hgb A1c MFr Bld: 5.8 % — ABNORMAL HIGH (ref 4.8–5.6)
Mean Plasma Glucose: 119.76 mg/dL

## 2024-03-23 LAB — LIPID PANEL
Cholesterol: 198 mg/dL (ref 0–200)
HDL: 48 mg/dL (ref >40)
LDL Cholesterol: 136 mg/dL — ABNORMAL HIGH (ref 0–99)
Total CHOL/HDL Ratio: 4.1 ratio
Triglycerides: 70 mg/dL (ref <150)
VLDL: 14 mg/dL (ref 0–40)

## 2024-03-23 MED ORDER — ACETAMINOPHEN 650 MG RE SUPP: 650.0000 mg | RECTAL | Status: DC | PRN

## 2024-03-23 MED ORDER — LEVETIRACETAM 500 MG PO TABS
1000.0000 mg | ORAL_TABLET | Freq: Two times a day (BID) | ORAL | Status: DC
  Administered 2024-03-23 – 2024-03-31 (×16): 1000 mg via ORAL
  Filled 2024-03-23 (×16): qty 2

## 2024-03-23 MED ORDER — IOHEXOL 350 MG/ML SOLN
100.0000 mL | Freq: Once | INTRAVENOUS | Status: AC | PRN
  Administered 2024-03-23: 100 mL via INTRAVENOUS

## 2024-03-23 MED ORDER — DIPHENHYDRAMINE HCL 25 MG PO CAPS
25.0000 mg | ORAL_CAPSULE | Freq: Once | ORAL | Status: AC
  Administered 2024-03-23: 25 mg via ORAL
  Filled 2024-03-23: qty 1

## 2024-03-23 MED ORDER — ESCITALOPRAM OXALATE 10 MG PO TABS
10.0000 mg | ORAL_TABLET | Freq: Every day | ORAL | Status: DC
  Administered 2024-03-24 – 2024-03-31 (×8): 10 mg via ORAL
  Filled 2024-03-23 (×8): qty 1

## 2024-03-23 MED ORDER — ACETAMINOPHEN 160 MG/5ML PO SOLN: 650.0000 mg | ORAL | Status: DC | PRN

## 2024-03-23 MED ORDER — LAMOTRIGINE 25 MG PO TABS
25.0000 mg | ORAL_TABLET | Freq: Two times a day (BID) | ORAL | Status: DC
  Administered 2024-03-23 – 2024-03-31 (×16): 25 mg via ORAL
  Filled 2024-03-23 (×16): qty 1

## 2024-03-23 MED ORDER — SODIUM CHLORIDE 0.9 % IV SOLN
INTRAVENOUS | Status: AC
  Administered 2024-03-23: 40 mL/h via INTRAVENOUS

## 2024-03-23 MED ORDER — PANTOPRAZOLE SODIUM 40 MG PO TBEC
40.0000 mg | DELAYED_RELEASE_TABLET | Freq: Every day | ORAL | Status: DC
  Administered 2024-03-24 – 2024-03-31 (×8): 40 mg via ORAL
  Filled 2024-03-23 (×8): qty 1

## 2024-03-23 MED ORDER — PREGABALIN 25 MG PO CAPS
25.0000 mg | ORAL_CAPSULE | Freq: Two times a day (BID) | ORAL | Status: DC
  Administered 2024-03-23 – 2024-03-31 (×16): 25 mg via ORAL
  Filled 2024-03-23 (×16): qty 1

## 2024-03-23 MED ORDER — ARIPIPRAZOLE 2 MG PO TABS
2.0000 mg | ORAL_TABLET | Freq: Every day | ORAL | Status: DC
  Administered 2024-03-23 – 2024-03-30 (×8): 2 mg via ORAL
  Filled 2024-03-23 (×9): qty 1

## 2024-03-23 MED ORDER — MELATONIN 3 MG PO TABS
3.0000 mg | ORAL_TABLET | Freq: Every day | ORAL | Status: DC
  Administered 2024-03-23 – 2024-03-30 (×8): 3 mg via ORAL
  Filled 2024-03-23 (×8): qty 1

## 2024-03-23 MED ORDER — LEVOTHYROXINE SODIUM 50 MCG PO TABS
50.0000 ug | ORAL_TABLET | Freq: Every day | ORAL | Status: DC
  Administered 2024-03-24 – 2024-03-31 (×8): 50 ug via ORAL
  Filled 2024-03-23 (×8): qty 1

## 2024-03-23 MED ORDER — STROKE: EARLY STAGES OF RECOVERY BOOK: Freq: Once | Status: AC

## 2024-03-23 MED ORDER — ACETAMINOPHEN 325 MG PO TABS
650.0000 mg | ORAL_TABLET | ORAL | Status: DC | PRN
  Administered 2024-03-23 – 2024-03-28 (×6): 650 mg via ORAL
  Filled 2024-03-23 (×6): qty 2

## 2024-03-23 NOTE — Plan of Care (Signed)

## 2024-03-23 NOTE — Consult Note (Addendum)
 Stroke Neurology Consultation Note  Consult Requested by: Dr. Efraim Grange  Reason for Consult: left sided weakness  Consult Date: 03/23/24   The history was obtained from the pt.  During history and examination, all items were able to obtain unless otherwise noted.  History of Present Illness:  Sheryl Hall is a 63 y.o. Caucasian female with PMH of stroke and seizure and bipolar disorder in wheelchair presented to ED for left sided weakness and numbness and difficulty walking.   Pt stated that she normally use the wheelchair a lot but able to transition her from wheelchair to couch at home. She was at her baseline yesterday and able to get up from couch to wheelchair to bathroom and back. But this morning she woke up at 8am and she was not able to transfer herself and almost fell. She called her sister at 9am and was told to call 911. She stated that she was not able to move her left side arm or leg at that time and felt numb on the left side also. Denies any vision changes, speech change or dizzy. She can not tell me when she was last seen normal yesterday or when she was last time normal before going to couch for sleep but she stated that she had insomnia and did not sleep well last night.   In ER, she had equal movement of BUEs, had weakness on the left leg comparing with right, however, in CT scanner, she was observed moving both leg equally but then when ask to move left leg again, she did not move as much as right. She stated that she has a lot of stress lately including her mom had stroke one month ago.   She follows with Dr. Godwin Lat at [facility] for stroke and seizure with progressive weakness and numbness in her legs, left worse than right. 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 She is on keppra  at home. Last seizure about 2 years ago. No blood thinners.   LSN: yesterday evening and she can not tell me exact when TNK Given:  No: outside window, and likely not stroke IR: no, no LVO sign mRS =  4  Past Medical History:  Diagnosis Date   Anxiety    Arthritis    Avascular necrosis of hip (HCC)    left   Bipolar 1 disorder (HCC)    Chronic left hip pain    Chronic nausea    Depression    Diverticulosis of colon    Family hx of colon cancer    age 89-father   Folate deficiency    GERD (gastroesophageal reflux disease)    Headache    Heart murmur    Hx of abnormal Pap smear    Hyperlipidemia    Hypothyroidism    Keratosis, actinic    Low back pain 03/01/2013   MRI with multiple levels of disc bulge   Panic attacks    Polysubstance abuse (HCC)    PTSD (post-traumatic stress disorder)    S/P colonoscopy 05/30/2010   LAX sphincter tone, anal papilla, left-sided diverticulosis, normal random biopsies,, 1 polyp-TA   S/P endoscopy 05/30/2010   Dr Rourk-> non--critical Schatzki's ring, s/p 73F dilation   Seizures (HCC)    Stroke (HCC) 2014   Right parietal, no deficits    Tubular adenoma of colon 05/30/2010   Next colonoscopy 05/2015   UTI (urinary tract infection)     Past  Surgical History:  Procedure Laterality Date   COLONOSCOPY     every 5 years   COLONOSCOPY  05/30/2010   RMR:lax anal sphincter tone,anal papilla,otherwise normal/left-sided diverticula   ESOPHAGOGASTRODUODENOSCOPY  05/30/2010   WUJ:[id number]'A ring/small HH otherwise normal   EXTERNAL EAR SURGERY Left 12 years ago   skin graft from behind ear put in ear canal   JOINT REPLACEMENT     NECK SURGERY  10 years ago   SKIN CANCER DESTRUCTION     TOTAL HIP ARTHROPLASTY Left 04/19/2014   Procedure: LEFT TOTAL HIP ARTHROPLASTY ANTERIOR APPROACH;  Surgeon: Aurther Blue, MD;  Location: [facility];  Service: Orthopedics;  Laterality: Left;   TOTAL HIP ARTHROPLASTY Right 05/23/2015   Procedure: RIGHT TOTAL HIP ARTHROPLASTY ANTERIOR APPROACH;  Surgeon: Liliane Rei, MD;  Location: [facility];  Service: Orthopedics;  Laterality: Right;     Family History  Problem Relation Age of Onset   Diabetes Mother    Hypertension Mother    Colon cancer Father 63       living    Hypertension Father    Hypertension Sister    Heart attack Brother    Seizures Maternal Aunt     Social History:  reports that she quit smoking about 22 months ago. Her smoking use included cigarettes. She started smoking about 31 years ago. She has a 15 pack-year smoking history. She has never used smokeless tobacco. She reports that she does not drink alcohol and does not use drugs.  Allergies: No Known Allergies  No current facility-administered medications on file prior to encounter.   Current Outpatient Medications on File Prior to Encounter  Medication Sig Dispense Refill   ARIPiprazole  (ABILIFY ) 2 MG tablet Take 1 tablet (2 mg total) by mouth at bedtime. 30 tablet 1   clonazePAM  (KLONOPIN ) 0.5 MG tablet Take 1 tablet (0.5 mg total) by mouth 2 (two) times daily as needed for anxiety. (Patient not taking: Reported on 03/03/2024) 30 tablet 0   escitalopram  (LEXAPRO ) 10 MG tablet Take 1 tablet (10 mg total) by mouth daily. 30 tablet 1   furosemide  (LASIX ) 20 MG tablet Take 1 tablet (20 mg total) by mouth daily as needed for edema. (Patient not taking: Reported on 03/03/2024) 15 tablet 0   lamoTRIgine  (LAMICTAL ) 25 MG tablet Take 1 tablet (25 mg total) by mouth 2 (two) times daily. 60 tablet 1   levETIRAcetam  (KEPPRA ) 1000 MG tablet Take 1 tablet (1,000 mg total) by mouth 2 (two) times daily. 60 tablet 3   levothyroxine  (SYNTHROID ) 50 MCG tablet TAKE ONE TABLET BY MOUTH EVERY DAY. 90 tablet 3   melatonin 3 MG TABS tablet Take 2 tablets (6 mg total) by mouth at bedtime. (Patient taking differently: Take 3 mg by mouth at bedtime.) 60 tablet 3   omeprazole  (PRILOSEC) 40 MG capsule Take 1 capsule (40 mg total) by mouth in the morning and at bedtime. 60 capsule 3   pregabalin  (LYRICA ) 25 MG capsule Take 1 capsule (25 mg total) by mouth 2 (two) times daily. 60  capsule 3   traZODone  (DESYREL ) 50 MG tablet Take 1 tablet (50 mg total) by mouth at bedtime. 30 tablet 1   [DISCONTINUED] LINZESS  72 MCG capsule TAKE ONE CAPSULE ORALLY EVERY MORNING BEFORE BREAKFAST. (Patient taking differently: Take 72 mcg by mouth daily before breakfast. ) 30 capsule 1    Review of Systems: A full ROS was attempted today and was able to be performed.  Systems assessed include - Constitutional, Eyes,  HENT, Respiratory, Cardiovascular, Gastrointestinal, Genitourinary, Integument/breast, Hematologic/lymphatic, Musculoskeletal, Neurological, Behavioral/Psych, Endocrine, Allergic/Immunologic - with pertinent responses as per HPI.  Physical Examination: Temp:  [98.9 F (37.2 C)] 98.9 F (37.2 C) (04/30 1041) Pulse Rate:  [86] 86 (04/30 1041) Resp:  [16] 16 (04/30 1041) BP: (142)/(119) 142/119 (04/30 1041) SpO2:  [93 %] 93 % (04/30 1041)  General - well nourished, well developed, in no apparent distress.    Ophthalmologic - fundi not visualized due to noncooperation.    Cardiovascular - regular rhythm and rate  Neuro - awake, alert, eyes open, orientated to age, place, time. No aphasia, fluent language, following all simple commands. Able to name and repeat. No gaze palsy, tracking bilaterally, visual field full. No facial droop. Tongue midline. Bilateral UEs 5/5, no drift. RLE 5/5 no drift LLE barely 3/5 but observed equal movement in CT with distract. Sensation decreased on the left face, UE and LE, b/l FTN ataxic, R HTS ataxic, not able to do L HTS, gait not tested.   NIHSS = 5 (1 for sensation, 2 for LLE, 2 for ataxia)   Data Reviewed: No results found.  Assessment: 63 y.o. female with PMH of stroke and seizure and bipolar disorder in wheelchair presented to ED for left sided weakness and numbness and difficulty walking since this morning after waking up. Last seen well last night but she can not tell me the exact time. NIHSS = 5, with LLE barely against gravity,  however, in CT scanner, she was observed moving both leg equally but then when ask to move left leg again, she did not move as much as right. She stated that she has a lot of stress lately including her mom had stroke one month ago. CT no acute finding and CTA head and neck no LVO and CTP neg. Etiology concerning for stress related, functional neurological disorder, but stroke also in DDx given her hx of stroke. Will check MRI and finish off stroke workup. PT and OT.   Stroke Risk Factors -  hx of stroke  Plan: Continue further stroke work up  Frequent neuro checks Telemetry monitoring MRI brain  Echocardiogram  UDS, fasting lipid panel and HgbA1C PT/OT/speech consult Permissive hypertension (only treat if BP > 220/120 unless a lower blood pressure is clinically necessary) for 24-48 hours post stroke onset GI and DVT prophylaxis  Swallow screen bedside.  ASA 81 for now if pass swallow, otherwise, ASA PR 300 Continue home keppra . If not able to take po, will do IV Discussed with Dr. Efraim Grange ED physician We will follow   Thank you for this consultation and allowing us  to participate in the care of this patient.  Consuelo Denmark, MD PhD Stroke Neurology 03/23/2024 11:55 AM

## 2024-03-23 NOTE — ED Triage Notes (Addendum)
 Patient brought by [facility] from home for weakness in her left leg. Patient has had generalized leg weakness for the past several weeks, but this morning was unable to stand on it. Patient has a history of strokes and was concerned it could be another one. She was wake and says she could move her left side about 0800 and afterwards she felt a pressure and numbness.

## 2024-03-23 NOTE — Progress Notes (Signed)
 Elert 1051  Pt reports that at 0800 today she could not move her entire L side.   Dr. Cleone Dad paged [phone] Per Dr. Cleone Dad call Dr. Christiane Cowing Dr. Christiane Cowing called at 1102 and joined the cart at 1103  Pt to CT 1125 Pt back from CT 1149  mRS 4 - pt states she currently uses a wheelchair but can transfer herself LNW bedtime last night

## 2024-03-23 NOTE — TOC Initial Note (Signed)
 Transition of Care [facility]) - Initial/Assessment Note    Patient Details  Name: Sheryl Hall MRN: [id number] Date of Birth: [birth year]  Transition [facility] Care [facility] CM/SW Contact:    Geraldina Klinefelter, RN Phone Number: 03/23/2024, 7:02 PM  Clinical Narrative:                 Pt lives alone c/aide M-F for 2 hours/day. Pt will likely dc to SNF. Prefers a facility in [place] or close to [place].   Expected Discharge Plan: Skilled Nursing Facility Barriers to Discharge: Continued Medical Work up  Patient Goals and CMS Choice Patient states their goals for this hospitalization and ongoing recovery are:: Strengthening; To get home. CMS Medicare.gov Compare Post Acute Care list provided to:: Patient Choice offered to / list presented to : Patient [facility] ownership interest in [facility].provided to:: Patient    Expected Discharge Plan and Services In-house Referral: Clinical Social Work Discharge Planning Services: CM Consult Post Acute Care Choice: Skilled Nursing Facility Living arrangements for the past 2 months: Single Family Home  Prior Living Arrangements/Services Living arrangements for the past 2 months: Single Family Home Lives with:: Self   Do you feel safe going back to the place where you live?: No   Too weak. Can't be alone.  Need for Family Participation in Patient Care: Yes (Comment) Care giver support system in place?: Yes (comment) Current home services: Other (comment) (Personal care aide) Criminal Activity/Legal Involvement Pertinent to Current Situation/Hospitalization: No - Comment as needed  Activities of Daily Living   ADL Screening (condition at time of admission) Is the patient deaf or have difficulty hearing?: No Does the patient have difficulty seeing, even when wearing glasses/contacts?: No Does the patient have difficulty concentrating, remembering, or making decisions?: No  Permission Sought/Granted Permission sought to share  information with : Case Manager, Magazine features editor Permission granted to share information with : Yes, Verbal Permission Granted  Emotional Assessment   Attitude/Demeanor/Rapport: Engaged Affect (typically observed): Calm Orientation: : Oriented to Self, Oriented to Place, Oriented to  Time, Oriented to Situation Alcohol / Substance Use: Not Applicable Psych Involvement: No (comment)  Admission diagnosis:  CVA (cerebral vascular accident) (HCC) [I63.9] Patient Active Problem List   Diagnosis Date Noted   CVA (cerebral vascular accident) (HCC) 03/23/2024   Muscle weakness 10/20/2023   Gait disorder 05/19/2023   Polyneuropathy 05/19/2023   Weakness generalized 03/03/2023   Generalized weakness 03/02/2023   Hypokalemia 03/02/2023   Hypomagnesemia 03/02/2023   Closed fracture of proximal end of right humerus with routine healing 01/01/2022   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   Avascular necrosis of femur head, right (HCC) 05/23/2015   Constipation 09/20/2014   Loose stools 08/02/2014   Postoperative anemia due to acute blood loss 04/20/2014   Avascular necrosis of femur head, left (HCC) 04/19/2014   OA (osteoarthritis) of hip 04/19/2014   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   Pain in limb 12/19/2013   History of ischemic stroke 12/19/2013   Acute confusional state 03/24/2013   Low back pain    PTSD (post-traumatic stress disorder) 03/04/2013   Bipolar disorder (HCC) 03/04/2013   Anxiety    Depression    Stroke (HCC)    Hyperthyroidism    Hyperlipidemia    Folate deficiency    Hx of abnormal Pap smear    Abdominal bloating 11/05/2011   IBS (irritable bowel syndrome) 11/05/2011   Chronic diarrhea 09/18/2011   Gastroparesis 09/18/2011   Drug  abuse (HCC) 09/18/2011   Tubular adenoma of colon 09/18/2011   Family hx of colon cancer 09/18/2011   COLONIC POLYPS, ADENOMATOUS 07/08/2010   DIZZINESS 07/08/2010   GERD 05/16/2010    WEIGHT LOSS 05/16/2010   NAUSEA WITH VOMITING 05/16/2010   Dysphagia 05/16/2010   DIARRHEA 05/16/2010   Abdominal pain 05/16/2010   PCP:  Austine Lefort, MD Pharmacy:   [facility] [place], [place] - [address] [place] [address] Phone: [phone] Fax: [phone]  Social Drivers of Health (SDOH) Social History: SDOH Screenings   Food Insecurity: No Food Insecurity (03/23/2024)  Housing: Low Risk  (03/23/2024)  Transportation Needs: Unmet Transportation Needs (03/23/2024)  Utilities: Not At Risk (03/23/2024)  Alcohol Screen: Low Risk  (07/18/2021)  Depression (PHQ2-9): High Risk (10/20/2023)  Social Connections: Socially Isolated (03/23/2024)  Tobacco Use: Medium Risk (03/23/2024)   SDOH Interventions:  Readmission Risk Interventions     No data to display

## 2024-03-23 NOTE — H&P (Signed)
 History and Physical    Sheryl Hall ZOX:[id number] DOB: February 07, 1961 DOA: 03/23/2024  PCP: Austine Lefort, MD   Patient coming from: Home  Chief Complaint: Left-sided weakness  HPI: Sheryl Hall is a 63 y.o. female with medical history significant for hypertension, dyslipidemia, prior CVA, GERD, hypothyroidism, bipolar disorder, anxiety/depression, and arthritis who presented to the ED for left-sided weakness and numbness and difficulty walking that began at approximately 8 AM when she woke up.  She normally uses wheelchair at home, but is able to transfer from wheelchair to couch and was not able to do so today.  She called her sister at 21 AM and she was told to call 911 and presented to the ED.  She was not able to move her left side arm or leg and had numbness upon arrival of EMS.  She had mild headache, but denied any speech changes or vision changes.  She was otherwise noted to be her usual self when she went to sleep on her couch last night.  She states that she is otherwise compliant with her home medications.  She follows with Dr. Godwin Lat at [facility] and last seizure was about 2 years ago.   ED Course: Vital signs stable and CT head with no acute findings noted.  Neurology consultation recommending aspirin 81 mg daily as well as further stroke workup.  Brain MRI thus far without any acute abnormalities and further workup pending.  Review of Systems: Reviewed as noted above, otherwise negative.  Past Medical History:  Diagnosis Date   Anxiety    Arthritis    Avascular necrosis of hip (HCC)    left   Bipolar 1 disorder (HCC)    Chronic left hip pain    Chronic nausea    Depression    Diverticulosis of colon    Family hx of colon cancer    age 45-father   Folate deficiency    GERD (gastroesophageal reflux disease)    Headache    Heart murmur    Hx of abnormal Pap smear    Hyperlipidemia    Hypothyroidism    Keratosis, actinic    Low back pain 03/01/2013   MRI with multiple  levels of disc bulge   Panic attacks    Polysubstance abuse (HCC)    PTSD (post-traumatic stress disorder)    S/P colonoscopy 05/30/2010   LAX sphincter tone, anal papilla, left-sided diverticulosis, normal random biopsies,, 1 polyp-TA   S/P endoscopy 05/30/2010   Dr Rourk-> non--critical Schatzki's ring, s/p 31F dilation   Seizures (HCC)    Stroke (HCC) 2014   Right parietal, no deficits    Tubular adenoma of colon 05/30/2010   Next colonoscopy 05/2015   UTI (urinary tract infection)     Past Surgical History:  Procedure Laterality Date   COLONOSCOPY     every 5 years   COLONOSCOPY  05/30/2010   RMR:lax anal sphincter tone,anal papilla,otherwise normal/left-sided diverticula   ESOPHAGOGASTRODUODENOSCOPY  05/30/2010   WJX:[id number]'H ring/small HH otherwise normal   EXTERNAL EAR SURGERY Left 12 years ago   skin graft from behind ear put in ear canal   JOINT REPLACEMENT     NECK SURGERY  10 years ago   SKIN CANCER DESTRUCTION     TOTAL HIP ARTHROPLASTY Left 04/19/2014   Procedure: LEFT TOTAL HIP ARTHROPLASTY ANTERIOR APPROACH;  Surgeon: Aurther Blue, MD;  Location: [facility];  Service: Orthopedics;  Laterality: Left;   TOTAL HIP ARTHROPLASTY Right 05/23/2015   Procedure: RIGHT  TOTAL HIP ARTHROPLASTY ANTERIOR APPROACH;  Surgeon: Liliane Rei, MD;  Location: [facility];  Service: Orthopedics;  Laterality: Right;     reports that she quit smoking about 22 months ago. Her smoking use included cigarettes. She started smoking about 31 years ago. She has a 15 pack-year smoking history. She has never used smokeless tobacco. She reports that she does not drink alcohol and does not use drugs.  Allergies  Allergen Reactions   Lamictal  [Lamotrigine ] Rash    Family History  Problem Relation Age of Onset   Diabetes Mother    Hypertension Mother    Colon cancer Father 45       living    Hypertension Father    Hypertension Sister    Heart attack Brother    Seizures Maternal Aunt      Prior to Admission medications   Medication Sig Start Date End Date Taking? Authorizing Provider  ARIPiprazole  (ABILIFY ) 2 MG tablet Take 1 tablet (2 mg total) by mouth at bedtime. 11/27/23  Yes Austine Lefort, MD  escitalopram  (LEXAPRO ) 10 MG tablet Take 1 tablet (10 mg total) by mouth daily. 11/27/23  Yes Austine Lefort, MD  lamoTRIgine  (LAMICTAL ) 25 MG tablet Take 1 tablet (25 mg total) by mouth 2 (two) times daily. 11/27/23  Yes Austine Lefort, MD  levETIRAcetam  (KEPPRA ) 1000 MG tablet Take 1 tablet (1,000 mg total) by mouth 2 (two) times daily. 12/07/23  Yes Austine Lefort, MD  levothyroxine  (SYNTHROID ) 50 MCG tablet TAKE ONE TABLET BY MOUTH EVERY DAY. 03/10/24  Yes Austine Lefort, MD  melatonin 3 MG TABS tablet Take 2 tablets (6 mg total) by mouth at bedtime. Patient taking differently: Take 3 mg by mouth at bedtime. 06/05/23  Yes Austine Lefort, MD  omeprazole  (PRILOSEC) 40 MG capsule Take 1 capsule (40 mg total) by mouth in the morning and at bedtime. 12/01/23  Yes Austine Lefort, MD  pregabalin  (LYRICA ) 25 MG capsule Take 1 capsule (25 mg total) by mouth 2 (two) times daily. 02/11/24  Yes Austine Lefort, MD  LINZESS  72 MCG capsule TAKE ONE CAPSULE ORALLY EVERY MORNING BEFORE BREAKFAST. Patient taking differently: Take 72 mcg by mouth daily before breakfast.  06/08/19 02/29/20  Iola Manila, NP    Physical Exam: Vitals:   03/23/24 1215 03/23/24 1230 03/23/24 1300 03/23/24 1501  BP: (!) 143/72 126/76 (!) 134/120 (!) 144/89  Pulse:    88  Resp: (!) 21 17  18   Temp:    99.4 F (37.4 C)  TempSrc:      SpO2:    96%  Weight: 83.9 kg     Height: 5\' 6"  (1.676 m)       Constitutional: NAD, calm, comfortable Vitals:   03/23/24 1215 03/23/24 1230 03/23/24 1300 03/23/24 1501  BP: (!) 143/72 126/76 (!) 134/120 (!) 144/89  Pulse:    88  Resp: (!) 21 17  18   Temp:    99.4 F (37.4 C)  TempSrc:      SpO2:    96%  Weight: 83.9 kg     Height: 5\' 6"  (1.676 m)      Eyes:  lids and conjunctivae normal Neck: normal, supple Respiratory: clear to auscultation bilaterally. Normal respiratory effort. No accessory muscle use.  Cardiovascular: Regular rate and rhythm, no murmurs. Abdomen: no tenderness, no distention. Bowel sounds positive.  Musculoskeletal:  No edema.  Left side weakness greater than right. Skin: no rashes, lesions, ulcers.  Psychiatric: Flat  affect  Labs on Admission: I have personally reviewed following labs and imaging studies  CBC: Recent Labs  Lab 03/23/24 1113  WBC 7.3  NEUTROABS 6.2  HGB 12.3  HCT 41.9  MCV 77.0*  PLT 209   Basic Metabolic Panel: Recent Labs  Lab 03/23/24 1113  NA 139  K 3.8  CL 103  CO2 25  GLUCOSE 115*  BUN 7*  CREATININE 1.01*  CALCIUM  9.1   GFR: Estimated Creatinine Clearance: 63.8 mL/min (A) (by C-G formula based on SCr of 1.01 mg/dL (H)). Liver Function Tests: Recent Labs  Lab 03/23/24 1113  AST 17  ALT 11  ALKPHOS 109  BILITOT 0.9  PROT 6.8  ALBUMIN 3.8   No results for input(s): "LIPASE", "AMYLASE" in the last 168 hours. No results for input(s): "AMMONIA" in the last 168 hours. Coagulation Profile: No results for input(s): "INR", "PROTIME" in the last 168 hours. Cardiac Enzymes: No results for input(s): "CKTOTAL", "CKMB", "CKMBINDEX", "TROPONINI" in the last 168 hours. BNP (last 3 results) No results for input(s): "PROBNP" in the last 8760 hours. HbA1C: No results for input(s): "HGBA1C" in the last 72 hours. CBG: No results for input(s): "GLUCAP" in the last 168 hours. Lipid Profile: Recent Labs    03/23/24 1113  CHOL 198  HDL 48  LDLCALC 136*  TRIG 70  CHOLHDL 4.1   Thyroid  Function Tests: No results for input(s): "TSH", "T4TOTAL", "FREET4", "T3FREE", "THYROIDAB" in the last 72 hours. Anemia Panel: No results for input(s): "VITAMINB12", "FOLATE", "FERRITIN", "TIBC", "IRON", "RETICCTPCT" in the last 72 hours. Urine analysis:    Component Value Date/Time   COLORURINE  YELLOW 09/07/2023 1157   APPEARANCEUR CLEAR 09/07/2023 1157   APPEARANCEUR Clear 10/27/2014 1036   LABSPEC 1.023 09/07/2023 1157   LABSPEC 1.010 10/27/2014 1036   PHURINE 5.5 09/07/2023 1157   GLUCOSEU NEGATIVE 09/07/2023 1157   GLUCOSEU Negative 10/27/2014 1036   GLUCOSEU NEG mg/dL 16/08/9603 5409   HGBUR NEGATIVE 09/07/2023 1157   BILIRUBINUR SMALL (A) 03/02/2023 0455   BILIRUBINUR negative 02/29/2020 1508   BILIRUBINUR Negative 10/27/2014 1036   KETONESUR NEGATIVE 09/07/2023 1157   PROTEINUR TRACE (A) 09/07/2023 1157   UROBILINOGEN 0.2 02/29/2020 1508   UROBILINOGEN 1.0 05/17/2015 1334   NITRITE NEGATIVE 09/07/2023 1157   LEUKOCYTESUR 2+ (A) 09/07/2023 1157   LEUKOCYTESUR Negative 10/27/2014 1036    Radiological Exams on Admission: MR BRAIN WO CONTRAST Result Date: 03/23/2024 CLINICAL DATA:  Left lower extremity weakness EXAM: MRI HEAD WITHOUT CONTRAST TECHNIQUE: Multiplanar, multiecho pulse sequences of the brain and surrounding structures were obtained without intravenous contrast. COMPARISON:  CT studies same day FINDINGS: Brain: Diffusion imaging does not show any acute or subacute infarction or other cause of restricted diffusion. No focal abnormality affects the brainstem or cerebellum. Cerebral hemispheres show an old cortical and subcortical infarction in the right parietooccipital junction region. There is atrophy and gliosis in this area. No other brain abnormality. No evidence of widespread small-vessel disease. No mass, recent hemorrhage, hydrocephalus or extra-axial collection. Mild hemosiderin deposition at the site of the old right-sided stroke. Vascular: Major vessels at the base of the brain show flow. Skull and upper cervical spine: Negative Sinuses/Orbits: Clear/normal Other: None IMPRESSION: No acute finding. 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 Electronically Signed   By: Bettylou Brunner M.D.   On: 03/23/2024 14:01   CT  ANGIO HEAD NECK W WO CM W PERF (CODE STROKE) Result Date: 03/23/2024 CLINICAL DATA:  63 year old female code  stroke presentation with lower extremity weakness. EXAM: CT ANGIOGRAPHY HEAD AND NECK CT PERFUSION BRAIN TECHNIQUE: Multidetector CT imaging of the head and neck was performed using the standard protocol during bolus administration of intravenous contrast. Multiplanar CT image reconstructions and MIPs were obtained to evaluate the vascular anatomy. Carotid stenosis measurements (when applicable) are obtained utilizing NASCET criteria, using the distal internal carotid diameter as the denominator. Multiphase CT imaging of the brain was performed following IV bolus contrast injection. Subsequent parametric perfusion maps were calculated using RAPID software. RADIATION DOSE REDUCTION: This exam was performed according to the departmental dose-optimization program which includes automated exposure control, adjustment of the mA and/or kV according to patient size and/or use of iterative reconstruction technique. CONTRAST:  OMNIPAQUE  IOHEXOL  350 MG/ML SOLN COMPARISON:  Plain head CT today 1135 hours. FINDINGS: CT Brain Perfusion Findings: ASPECTS: 10, chronic encephalomalacia. CBF (<30%) Volume: 0mL Perfusion (Tmax>6.0s) volume: 0mL No CT Perfusion parameter abnormality is detected. Mismatch Volume: Not applicable Infarction Location:Not applicable CTA NECK Skeleton: C4-C5 cervical ACDF with appearance of solid arthrodesis. Some background osteopenia. No acute osseous abnormality identified. Upper chest: Negative. Other neck: Negative aside from partially retropharyngeal course of the right carotids, normal variant. Partial thyroid  atrophy Aortic arch: 3 vessel arch. Mild-to-moderate Calcified aortic atherosclerosis. Right carotid system: Mild brachiocephalic artery soft and calcified plaque without stenosis. Negative right CCA. Minimal plaque at the right carotid bifurcation. Partially retropharyngeal right  ICA appears patent and negative to the skull base. Left carotid system: Negative left CCA. Minimal soft plaque at the left carotid bifurcation. Left ICA appears patent and normal to the skull base. Vertebral arteries: Minor plaque at the right subclavian origin without stenosis. Normal right vertebral artery origin. Right vertebral artery is patent to the skull base with no plaque or stenosis. Proximal left subclavian artery soft plaque at its origin resulting in up to 50% stenosis as seen on series 9, image 124. Left vertebral origin remains normal. Fairly codominant left vertebral arteries patent and normal to the skull base. CTA HEAD Posterior circulation: Codominant distal vertebral arteries, PICA origins, vertebrobasilar junction, basilar artery, SCA and PCA origins appear patent and normal. Posterior communicating arteries are diminutive or absent. Bilateral PCA branches are within normal limits. Anterior circulation: Both ICA siphons are patent. No significant left siphon plaque, no left siphon stenosis. Minimal right siphon calcified plaque without stenosis. Patent carotid termini. Normal MCA and ACA origins. Normal anterior communicating artery, bilateral ACA branches. Left MCA M1 segment and trifurcation are patent without stenosis. Right MCA M1 segment and bifurcation are patent without stenosis. Bilateral MCA branches are within normal limits. Venous sinuses: Patent. Anatomic variants: None significant. Review of the MIP images confirms the above findings IMPRESSION: 1. Negative CT Perfusion. And CTA is negative for large vessel occlusion. 2. Minimal atherosclerosis in the head and neck. 3. Aortic Atherosclerosis (ICD10-I70.0). 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 4. Prior cervical ACDF with evidence of arthrodesis. #1 communicated to Dr. Christiane Cowing at 12:02 pm on 03/23/2024 by text page via the [facility]  messaging system. Electronically Signed   By: Marlise Simpers M.D.   On: 03/23/2024 12:04   CT HEAD CODE STROKE WO CONTRAST Result Date: 03/23/2024 CLINICAL DATA:  Code stroke. 63 year old female with lower extremity weakness. EXAM: CT HEAD WITHOUT CONTRAST TECHNIQUE: Contiguous axial images were obtained from the base of the skull through the vertex without intravenous contrast. RADIATION DOSE REDUCTION: This  exam was performed according to the departmental dose-optimization program which includes automated exposure control, adjustment of the mA and/or kV according to patient size and/or use of iterative reconstruction technique. COMPARISON:  Brain MRI 08/06/2023.  Head CT 06/22/2016. FINDINGS: Brain: Cerebral volume not significantly changed since 2017. Chronic posterior right hemisphere encephalomalacia including the junctions of the right inferior parietal, superior occipital and posterior temporal lobes appears unchanged. No midline shift, ventriculomegaly, mass effect, evidence of mass lesion, intracranial hemorrhage or evidence of cortically based acute infarction. Vascular: No suspicious intracranial vascular hyperdensity. Skull: Mild motion artifact. No acute osseous abnormality identified. Sinuses/Orbits: Visualized paranasal sinuses and mastoids are stable and well aerated. Other: No gaze deviation. No acute orbit or scalp soft tissue finding. ASPECTS [facility] Stroke Program Early CT Score) Total score (0-10 with 10 being normal): 10, chronic encephalomalacia. IMPRESSION: 1. No acute cortically based infarct or acute intracranial hemorrhage identified. ASPECTS 10. 2. Stable chronic right posterior hemisphere encephalomalacia. These results were communicated to Dr. Christiane Cowing at 11:55 am on 03/23/2024 by text page via the [facility] messaging system. Electronically Signed   By: Marlise Simpers M.D.   On: 03/23/2024 11:56    EKG: Independently reviewed. SR 74bpm.  Assessment/Plan Principal Problem:   CVA (cerebral vascular  accident) (HCC)    Left-sided hemiparesis concern for CVA versus functional neurological disorder - Appreciate neurology recommendations, brain MRI with no acute findings - Finish off stroke workup with echo as well as other lab tests - Aspirin 81 mg daily  Seizure disorder - Continue home Keppra   Bipolar disorder/anxiety/depression - Continue home medications  Hypothyroidism - Continue levothyroxine   GERD - PPI  History of CVA/dyslipidemia - Continue statin and aspirin as ordered  DVT prophylaxis: SCDs Code Status: Full Family Communication:None at bedside  Disposition Plan:Admit for CVA evaluation Consults called:Neurology Admission status: Obs, tele  Severity of Illness: The appropriate patient status for this patient is OBSERVATION. Observation status is judged to be reasonable and necessary in order to provide the required intensity of service to ensure the patient's safety. The patient's presenting symptoms, physical exam findings, and initial radiographic and laboratory data in the context of their medical condition is felt to place them at decreased risk for further clinical deterioration. Furthermore, it is anticipated that the patient will be medically stable for discharge from the hospital within 2 midnights of admission.    Armaan Pond D Mason Sole DO [facility]  If 7PM-7AM, please contact night-coverage [email or web address]  03/23/2024, 3:18 PM

## 2024-03-23 NOTE — Progress Notes (Signed)
 SLP Cancellation Note  Patient Details Name: Sheryl Hall MRN: [id number] DOB: 07/23/1961   Cancelled treatment:        Pt passed [place] Swallow Screen and is tolerating soft/thin diet without incident. Our service will sign off. Thank you,  Verlan Grotz H. Vergil Glasser, CCC-SLP Speech Language Pathologist    Florina Husbands 03/23/2024, 5:06 PM

## 2024-03-23 NOTE — ED Notes (Signed)
 ED TO INPATIENT HANDOFF REPORT  ED Nurse Name and Phone #: Gaylan Kaufman [phone]  S Name/Age/Gender Sheryl Hall 63 y.o. female Room/Bed: APA07/APA07  Code Status   Code Status: Prior  Home/SNF/Other Home Patient oriented to: self, place, time, and situation Is this baseline? Yes   Triage Complete: Triage complete  Chief Complaint CVA (cerebral vascular accident) [facility]) [I63.9]  Triage Note Patient brought by [facility] from home for weakness in her left leg. Patient has had generalized leg weakness for the past several weeks, but this morning was unable to stand on it. Patient has a history of strokes and was concerned it could be another one. She was wake and says she could move her left side about 0800 and afterwards she felt a pressure and numbness.   Allergies No Known Allergies  Level of Care/Admitting Diagnosis ED Disposition     ED Disposition  Admit   Condition  --   Comment  Hospital Area: [facility] [[id number]]  Level of Care: Telemetry [5]  Covid Evaluation: Asymptomatic - no recent exposure (last 10 days) testing not required  Diagnosis: CVA (cerebral vascular accident) [facility]) [[id number]]  Admitting Physician: Cornelius Dill [[id number]]  Attending Physician: Cornelius Dill [[id number]]          B Medical/Surgery History Past Medical History:  Diagnosis Date   Anxiety    Arthritis    Avascular necrosis of hip (HCC)    left   Bipolar 1 disorder (HCC)    Chronic left hip pain    Chronic nausea    Depression    Diverticulosis of colon    Family hx of colon cancer    age 35-father   Folate deficiency    GERD (gastroesophageal reflux disease)    Headache    Heart murmur    Hx of abnormal Pap smear    Hyperlipidemia    Hypothyroidism    Keratosis, actinic    Low back pain 03/01/2013   MRI with multiple levels of disc bulge   Panic attacks    Polysubstance abuse (HCC)    PTSD (post-traumatic stress disorder)    S/P colonoscopy  05/30/2010   LAX sphincter tone, anal papilla, left-sided diverticulosis, normal random biopsies,, 1 polyp-TA   S/P endoscopy 05/30/2010   Dr Rourk-> non--critical Schatzki's ring, s/p 31F dilation   Seizures (HCC)    Stroke (HCC) 2014   Right parietal, no deficits    Tubular adenoma of colon 05/30/2010   Next colonoscopy 05/2015   UTI (urinary tract infection)    Past Surgical History:  Procedure Laterality Date   COLONOSCOPY     every 5 years   COLONOSCOPY  05/30/2010   RMR:lax anal sphincter tone,anal papilla,otherwise normal/left-sided diverticula   ESOPHAGOGASTRODUODENOSCOPY  05/30/2010   BMW:[id number]'O ring/small HH otherwise normal   EXTERNAL EAR SURGERY Left 12 years ago   skin graft from behind ear put in ear canal   JOINT REPLACEMENT     NECK SURGERY  10 years ago   SKIN CANCER DESTRUCTION     TOTAL HIP ARTHROPLASTY Left 04/19/2014   Procedure: LEFT TOTAL HIP ARTHROPLASTY ANTERIOR APPROACH;  Surgeon: Aurther Blue, MD;  Location: [facility] ORS;  Service: Orthopedics;  Laterality: Left;   TOTAL HIP ARTHROPLASTY Right 05/23/2015   Procedure: RIGHT TOTAL HIP ARTHROPLASTY ANTERIOR APPROACH;  Surgeon: Liliane Rei, MD;  Location: [facility] ORS;  Service: Orthopedics;  Laterality: Right;     A IV Location/Drains/Wounds Patient Lines/Drains/Airways Status  Active Line/Drains/Airways     None            Intake/Output Last 24 hours No intake or output data in the 24 hours ending 03/23/24 1353  Labs/Imaging Results for orders placed or performed during the hospital encounter of 03/23/24 (from the past 48 hours)  CBC     Status: Abnormal   Collection Time: 03/23/24 11:13 AM  Result Value Ref Range   WBC 7.3 4.0 - 10.5 K/uL   RBC 5.44 (H) 3.87 - 5.11 MIL/uL   Hemoglobin 12.3 12.0 - 15.0 g/dL   HCT 24.4 01.0 - 27.2 %   MCV 77.0 (L) 80.0 - 100.0 fL   MCH 22.6 (L) 26.0 - 34.0 pg   MCHC 29.4 (L) 30.0 - 36.0 g/dL   RDW 53.6 (H) 64.4 - 03.4 %   Platelets 209 150 - 400 K/uL    nRBC 0.0 0.0 - 0.2 %    Comment: Performed at [facility], [address] [place], [place] [address]  Comprehensive metabolic panel     Status: Abnormal   Collection Time: 03/23/24 11:13 AM  Result Value Ref Range   Sodium 139 135 - 145 mmol/L   Potassium 3.8 3.5 - 5.1 mmol/L   Chloride 103 98 - 111 mmol/L   CO2 25 22 - 32 mmol/L   Glucose, Bld 115 (H) 70 - 99 mg/dL    Comment: Glucose reference range applies only to samples taken after fasting for at least 8 hours.   BUN 7 (L) 8 - 23 mg/dL   Creatinine, Ser 5.63 (H) 0.44 - 1.00 mg/dL   Calcium  9.1 8.9 - 10.3 mg/dL   Total Protein 6.8 6.5 - 8.1 g/dL   Albumin 3.8 3.5 - 5.0 g/dL   AST 17 15 - 41 U/L   ALT 11 0 - 44 U/L   Alkaline Phosphatase 109 38 - 126 U/L   Total Bilirubin 0.9 0.0 - 1.2 mg/dL   GFR, Estimated >87 >56 mL/min    Comment: (NOTE) Calculated using the CKD-EPI Creatinine Equation (2021)    Anion gap 11 5 - 15    Comment: Performed at [facility], [address], [place], [place] [address]  Differential     Status: Abnormal   Collection Time: 03/23/24 11:13 AM  Result Value Ref Range   Neutrophils Relative % 86 %   Neutro Abs 6.2 1.7 - 7.7 K/uL   Lymphocytes Relative 8 %   Lymphs Abs 0.6 (L) 0.7 - 4.0 K/uL   Monocytes Relative 6 %   Monocytes Absolute 0.4 0.1 - 1.0 K/uL   Eosinophils Relative 0 %   Eosinophils Absolute 0.0 0.0 - 0.5 K/uL   Basophils Relative 0 %   Basophils Absolute 0.0 0.0 - 0.1 K/uL   Immature Granulocytes 0 %   Abs Immature Granulocytes 0.02 0.00 - 0.07 K/uL    Comment: Performed at [facility], [address], [place], [place] [address]   CT ANGIO HEAD NECK W WO CM W PERF (CODE STROKE) Result Date: 03/23/2024 CLINICAL DATA:  63 year old female code stroke presentation with lower extremity weakness. EXAM: CT ANGIOGRAPHY HEAD AND NECK CT PERFUSION BRAIN TECHNIQUE: Multidetector CT imaging of the head and neck was performed using the standard protocol during bolus administration of  intravenous contrast. Multiplanar CT image reconstructions and MIPs were obtained to evaluate the vascular anatomy. Carotid stenosis measurements (when applicable) are obtained utilizing NASCET criteria, using the distal internal carotid diameter as the denominator. Multiphase CT imaging  of the brain was performed following IV bolus contrast injection. Subsequent parametric perfusion maps were calculated using RAPID software. RADIATION DOSE REDUCTION: This exam was performed according to the departmental dose-optimization program which includes automated exposure control, adjustment of the mA and/or kV according to patient size and/or use of iterative reconstruction technique. CONTRAST:  OMNIPAQUE  IOHEXOL  350 MG/ML SOLN COMPARISON:  Plain head CT today 1135 hours. FINDINGS: CT Brain Perfusion Findings: ASPECTS: 10, chronic encephalomalacia. CBF (<30%) Volume: 0mL Perfusion (Tmax>6.0s) volume: 0mL No CT Perfusion parameter abnormality is detected. Mismatch Volume: Not applicable Infarction Location:Not applicable CTA NECK Skeleton: C4-C5 cervical ACDF with appearance of solid arthrodesis. Some background osteopenia. No acute osseous abnormality identified. Upper chest: Negative. Other neck: Negative aside from partially retropharyngeal course of the right carotids, normal variant. Partial thyroid  atrophy Aortic arch: 3 vessel arch. Mild-to-moderate Calcified aortic atherosclerosis. Right carotid system: Mild brachiocephalic artery soft and calcified plaque without stenosis. Negative right CCA. Minimal plaque at the right carotid bifurcation. Partially retropharyngeal right ICA appears patent and negative to the skull base. Left carotid system: Negative left CCA. Minimal soft plaque at the left carotid bifurcation. Left ICA appears patent and normal to the skull base. Vertebral arteries: Minor plaque at the right subclavian origin without stenosis. Normal right vertebral artery origin. Right vertebral artery is  patent to the skull base with no plaque or stenosis. Proximal left subclavian artery soft plaque at its origin resulting in up to 50% stenosis as seen on series 9, image 124. Left vertebral origin remains normal. Fairly codominant left vertebral arteries patent and normal to the skull base. CTA HEAD Posterior circulation: Codominant distal vertebral arteries, PICA origins, vertebrobasilar junction, basilar artery, SCA and PCA origins appear patent and normal. Posterior communicating arteries are diminutive or absent. Bilateral PCA branches are within normal limits. Anterior circulation: Both ICA siphons are patent. No significant left siphon plaque, no left siphon stenosis. Minimal right siphon calcified plaque without stenosis. Patent carotid termini. Normal MCA and ACA origins. Normal anterior communicating artery, bilateral ACA branches. Left MCA M1 segment and trifurcation are patent without stenosis. Right MCA M1 segment and bifurcation are patent without stenosis. Bilateral MCA branches are within normal limits. Venous sinuses: Patent. Anatomic variants: None significant. Review of the MIP images confirms the above findings IMPRESSION: 1. Negative CT Perfusion. And CTA is negative for large vessel occlusion. 2. Minimal atherosclerosis in the head and neck. 3. Aortic Atherosclerosis (ICD10-I70.0). 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 4. Prior cervical ACDF with evidence of arthrodesis. #1 communicated to Dr. Christiane Cowing at 12:02 pm on 03/23/2024 by text page via the [facility] messaging system. Electronically Signed   By: Marlise Simpers M.D.   On: 03/23/2024 12:04   CT HEAD CODE STROKE WO CONTRAST Result Date: 03/23/2024 CLINICAL DATA:  Code stroke. 63 year old female with lower extremity weakness. EXAM: CT HEAD WITHOUT CONTRAST TECHNIQUE: Contiguous axial images were obtained from the base of the skull through the  vertex without intravenous contrast. RADIATION DOSE REDUCTION: This exam was performed according to the departmental dose-optimization program which includes automated exposure control, adjustment of the mA and/or kV according to patient size and/or use of iterative reconstruction technique. COMPARISON:  Brain MRI 08/06/2023.  Head CT 06/22/2016. FINDINGS: Brain: Cerebral volume not significantly changed since 2017. Chronic posterior right hemisphere encephalomalacia including the junctions of the right inferior parietal, superior occipital and posterior temporal lobes appears unchanged. No midline  shift, ventriculomegaly, mass effect, evidence of mass lesion, intracranial hemorrhage or evidence of cortically based acute infarction. Vascular: No suspicious intracranial vascular hyperdensity. Skull: Mild motion artifact. No acute osseous abnormality identified. Sinuses/Orbits: Visualized paranasal sinuses and mastoids are stable and well aerated. Other: No gaze deviation. No acute orbit or scalp soft tissue finding. ASPECTS [facility] Stroke Program Early CT Score) Total score (0-10 with 10 being normal): 10, chronic encephalomalacia. IMPRESSION: 1. No acute cortically based infarct or acute intracranial hemorrhage identified. ASPECTS 10. 2. Stable chronic right posterior hemisphere encephalomalacia. These results were communicated to Dr. Christiane Cowing at 11:55 am on 03/23/2024 by text page via the [facility] messaging system. Electronically Signed   By: Marlise Simpers M.D.   On: 03/23/2024 11:56    Pending Labs Unresulted Labs (From admission, onward)     Start     Ordered   03/23/24 1056  Urine rapid drug screen (hosp performed)  Once,   STAT        03/23/24 1055            Vitals/Pain Today's Vitals   03/23/24 1041 03/23/24 1200 03/23/24 1215 03/23/24 1230  BP: (!) 142/119 133/70 (!) 143/72 126/76  Pulse: 86 73    Resp: 16  (!) 21 17  Temp: 98.9 F (37.2 C)     TempSrc: Oral     SpO2: 93% 90%    Weight:   83.9 kg    Height:   5\' 6"  (1.676 m)   PainSc:        Isolation Precautions No active isolations  Medications Medications  iohexol  (OMNIPAQUE ) 350 MG/ML injection 100 mL (100 mLs Intravenous Contrast Given 03/23/24 1142)    Mobility manual wheelchair       R Recommendations: See Admitting Provider Note  Report given to:

## 2024-03-23 NOTE — ED Provider Notes (Signed)
 [place] EMERGENCY DEPARTMENT AT [facility] Provider Note   CSN: [id number] Arrival date & time: 03/23/24  1030     History  Chief Complaint  Patient presents with   Extremity Weakness   Code Stroke    Sheryl Hall is a 63 y.o. female.  63 year old female with a history of stroke and seizures who presents emergency department for leg weakness.  Patient reports that this morning she noticed that she was having left lower extremity worse than right lower extremity weakness.  Thinks it started at 8 AM after she was awake.  Also is complaining of numbness and tingling in her legs. Denies neck or back pain.        Home Medications Prior to Admission medications   Medication Sig Start Date End Date Taking? Authorizing Provider  ARIPiprazole  (ABILIFY ) 2 MG tablet Take 1 tablet (2 mg total) by mouth at bedtime. 11/27/23  Yes Austine Lefort, MD  escitalopram  (LEXAPRO ) 10 MG tablet Take 1 tablet (10 mg total) by mouth daily. 11/27/23  Yes Austine Lefort, MD  lamoTRIgine  (LAMICTAL ) 25 MG tablet Take 1 tablet (25 mg total) by mouth 2 (two) times daily. 11/27/23  Yes Austine Lefort, MD  levETIRAcetam  (KEPPRA ) 1000 MG tablet Take 1 tablet (1,000 mg total) by mouth 2 (two) times daily. 12/07/23  Yes Austine Lefort, MD  levothyroxine  (SYNTHROID ) 50 MCG tablet TAKE ONE TABLET BY MOUTH EVERY DAY. 03/10/24  Yes Austine Lefort, MD  melatonin 3 MG TABS tablet Take 2 tablets (6 mg total) by mouth at bedtime. Patient taking differently: Take 3 mg by mouth at bedtime. 06/05/23  Yes Austine Lefort, MD  omeprazole  (PRILOSEC) 40 MG capsule Take 1 capsule (40 mg total) by mouth in the morning and at bedtime. 12/01/23  Yes Austine Lefort, MD  pregabalin  (LYRICA ) 25 MG capsule Take 1 capsule (25 mg total) by mouth 2 (two) times daily. 02/11/24  Yes Austine Lefort, MD  LINZESS  72 MCG capsule TAKE ONE CAPSULE ORALLY EVERY MORNING BEFORE BREAKFAST. Patient taking differently: Take 72  mcg by mouth daily before breakfast.  06/08/19 02/29/20  Iola Manila, NP      Allergies    Lamictal  [lamotrigine ]    Review of Systems   Review of Systems  Physical Exam Updated Vital Signs BP (!) 144/89 (BP Location: Right Arm)   Pulse 88   Temp 99.4 F (37.4 C)   Resp 18   Ht 5\' 6"  (1.676 m)   Wt 83.9 kg   SpO2 96%   BMI 29.86 kg/m  Physical Exam Vitals and nursing note reviewed.  Constitutional:      General: She is not in acute distress.    Appearance: She is well-developed.     Comments: Somewhat anxious appearing  HENT:     Head: Normocephalic and atraumatic.     Right Ear: External ear normal.     Left Ear: External ear normal.     Nose: Nose normal.  Eyes:     Extraocular Movements: Extraocular movements intact.     Conjunctiva/sclera: Conjunctivae normal.     Pupils: Pupils are equal, round, and reactive to light.  Pulmonary:     Effort: Pulmonary effort is normal. No respiratory distress.  Musculoskeletal:     Cervical back: Normal range of motion and neck supple.     Right lower leg: No edema.     Left lower leg: No edema.  Skin:  General: Skin is warm and dry.  Neurological:     Mental Status: She is alert and oriented to person, place, and time.     Comments: Level of Consciousness: AAOx3 Best Gaze: Horizontal ocular movements intact  Visual Fields: No visual field loss  Facial Palsy: None  L Upper Extremity Motor: No drift after 10 seconds  R Upper Extremity Motor: No drift after 10 seconds  L Lower extremity Motor: Drift that hits stretcher R Lower extremity Motor: No drift after 5 seconds  Sensory: Intact sensation to light touch on face, arms, trunk, and legs bilaterally  Best Language: No aphasia  Dysarthria: No dysarthria   Psychiatric:        Mood and Affect: Mood normal.     ED Results / Procedures / Treatments   Labs (all labs ordered are listed, but only abnormal results are displayed) Labs Reviewed  CBC - Abnormal; Notable for  the following components:      Result Value   RBC 5.44 (*)    MCV 77.0 (*)    MCH 22.6 (*)    MCHC 29.4 (*)    RDW 17.4 (*)    All other components within normal limits  COMPREHENSIVE METABOLIC PANEL WITH GFR - Abnormal; Notable for the following components:   Glucose, Bld 115 (*)    BUN 7 (*)    Creatinine, Ser 1.01 (*)    All other components within normal limits  DIFFERENTIAL - Abnormal; Notable for the following components:   Lymphs Abs 0.6 (*)    All other components within normal limits  LIPID PANEL - Abnormal; Notable for the following components:   LDL Cholesterol 136 (*)    All other components within normal limits  HEMOGLOBIN A1C - Abnormal; Notable for the following components:   Hgb A1c MFr Bld 5.8 (*)    All other components within normal limits  RAPID URINE DRUG SCREEN, HOSP PERFORMED  HIV ANTIBODY (ROUTINE TESTING W REFLEX)  I-STAT CHEM 8, ED    EKG EKG Interpretation Date/Time:  Wednesday March 23 2024 12:14:40 EDT Ventricular Rate:  74 PR Interval:  173 QRS Duration:  87 QT Interval:  415 QTC Calculation: 461 R Axis:   50  Text Interpretation: Sinus rhythm Confirmed by Shyrl Doyne [id number]) on 03/23/2024 1:39:03 PM  Radiology MR BRAIN WO CONTRAST Result Date: 03/23/2024 CLINICAL DATA:  Left lower extremity weakness EXAM: MRI HEAD WITHOUT CONTRAST TECHNIQUE: Multiplanar, multiecho pulse sequences of the brain and surrounding structures were obtained without intravenous contrast. COMPARISON:  CT studies same day FINDINGS: Brain: Diffusion imaging does not show any acute or subacute infarction or other cause of restricted diffusion. No focal abnormality affects the brainstem or cerebellum. Cerebral hemispheres show an old cortical and subcortical infarction in the right parietooccipital junction region. There is atrophy and gliosis in this area. No other brain abnormality. No evidence of widespread small-vessel disease. No mass, recent hemorrhage,  hydrocephalus or extra-axial collection. Mild hemosiderin deposition at the site of the old right-sided stroke. Vascular: Major vessels at the base of the brain show flow. Skull and upper cervical spine: Negative Sinuses/Orbits: Clear/normal Other: None IMPRESSION: No acute finding. 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 Electronically Signed   By: Bettylou Brunner M.D.   On: 03/23/2024 14:01   CT ANGIO HEAD NECK W WO CM W PERF (CODE STROKE) Result Date: 03/23/2024 CLINICAL DATA:  63 year old female code stroke presentation with lower extremity weakness. EXAM: CT ANGIOGRAPHY HEAD AND  NECK CT PERFUSION BRAIN TECHNIQUE: Multidetector CT imaging of the head and neck was performed using the standard protocol during bolus administration of intravenous contrast. Multiplanar CT image reconstructions and MIPs were obtained to evaluate the vascular anatomy. Carotid stenosis measurements (when applicable) are obtained utilizing NASCET criteria, using the distal internal carotid diameter as the denominator. Multiphase CT imaging of the brain was performed following IV bolus contrast injection. Subsequent parametric perfusion maps were calculated using RAPID software. RADIATION DOSE REDUCTION: This exam was performed according to the departmental dose-optimization program which includes automated exposure control, adjustment of the mA and/or kV according to patient size and/or use of iterative reconstruction technique. CONTRAST:  OMNIPAQUE  IOHEXOL  350 MG/ML SOLN COMPARISON:  Plain head CT today 1135 hours. FINDINGS: CT Brain Perfusion Findings: ASPECTS: 10, chronic encephalomalacia. CBF (<30%) Volume: 0mL Perfusion (Tmax>6.0s) volume: 0mL No CT Perfusion parameter abnormality is detected. Mismatch Volume: Not applicable Infarction Location:Not applicable CTA NECK Skeleton: C4-C5 cervical ACDF with appearance of solid arthrodesis. Some background osteopenia. No acute osseous  abnormality identified. Upper chest: Negative. Other neck: Negative aside from partially retropharyngeal course of the right carotids, normal variant. Partial thyroid  atrophy Aortic arch: 3 vessel arch. Mild-to-moderate Calcified aortic atherosclerosis. Right carotid system: Mild brachiocephalic artery soft and calcified plaque without stenosis. Negative right CCA. Minimal plaque at the right carotid bifurcation. Partially retropharyngeal right ICA appears patent and negative to the skull base. Left carotid system: Negative left CCA. Minimal soft plaque at the left carotid bifurcation. Left ICA appears patent and normal to the skull base. Vertebral arteries: Minor plaque at the right subclavian origin without stenosis. Normal right vertebral artery origin. Right vertebral artery is patent to the skull base with no plaque or stenosis. Proximal left subclavian artery soft plaque at its origin resulting in up to 50% stenosis as seen on series 9, image 124. Left vertebral origin remains normal. Fairly codominant left vertebral arteries patent and normal to the skull base. CTA HEAD Posterior circulation: Codominant distal vertebral arteries, PICA origins, vertebrobasilar junction, basilar artery, SCA and PCA origins appear patent and normal. Posterior communicating arteries are diminutive or absent. Bilateral PCA branches are within normal limits. Anterior circulation: Both ICA siphons are patent. No significant left siphon plaque, no left siphon stenosis. Minimal right siphon calcified plaque without stenosis. Patent carotid termini. Normal MCA and ACA origins. Normal anterior communicating artery, bilateral ACA branches. Left MCA M1 segment and trifurcation are patent without stenosis. Right MCA M1 segment and bifurcation are patent without stenosis. Bilateral MCA branches are within normal limits. Venous sinuses: Patent. Anatomic variants: None significant. Review of the MIP images confirms the above findings  IMPRESSION: 1. Negative CT Perfusion. And CTA is negative for large vessel occlusion. 2. Minimal atherosclerosis in the head and neck. 3. Aortic Atherosclerosis (ICD10-I70.0). 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 4. Prior cervical ACDF with evidence of arthrodesis. #1 communicated to Dr. Christiane Cowing at 12:02 pm on 03/23/2024 by text page via the [facility] messaging system. Electronically Signed   By: Marlise Simpers M.D.   On: 03/23/2024 12:04   CT HEAD CODE STROKE WO CONTRAST Result Date: 03/23/2024 CLINICAL DATA:  Code stroke. 63 year old female with lower extremity weakness. EXAM: CT HEAD WITHOUT CONTRAST TECHNIQUE: Contiguous axial images were obtained from the base of the skull through the vertex without intravenous contrast. RADIATION DOSE REDUCTION: This exam was performed according to the departmental dose-optimization program which includes  automated exposure control, adjustment of the mA and/or kV according to patient size and/or use of iterative reconstruction technique. COMPARISON:  Brain MRI 08/06/2023.  Head CT 06/22/2016. FINDINGS: Brain: Cerebral volume not significantly changed since 2017. Chronic posterior right hemisphere encephalomalacia including the junctions of the right inferior parietal, superior occipital and posterior temporal lobes appears unchanged. No midline shift, ventriculomegaly, mass effect, evidence of mass lesion, intracranial hemorrhage or evidence of cortically based acute infarction. Vascular: No suspicious intracranial vascular hyperdensity. Skull: Mild motion artifact. No acute osseous abnormality identified. Sinuses/Orbits: Visualized paranasal sinuses and mastoids are stable and well aerated. Other: No gaze deviation. No acute orbit or scalp soft tissue finding. ASPECTS [facility] Stroke Program Early CT Score) Total score (0-10 with 10 being normal): 10, chronic encephalomalacia.  IMPRESSION: 1. No acute cortically based infarct or acute intracranial hemorrhage identified. ASPECTS 10. 2. Stable chronic right posterior hemisphere encephalomalacia. These results were communicated to Dr. Christiane Cowing at 11:55 am on 03/23/2024 by text page via the [facility] messaging system. Electronically Signed   By: Marlise Simpers M.D.   On: 03/23/2024 11:56    Procedures Procedures    Medications Ordered in ED Medications  ARIPiprazole  (ABILIFY ) tablet 2 mg (has no administration in time range)  escitalopram  (LEXAPRO ) tablet 10 mg (has no administration in time range)  levothyroxine  (SYNTHROID ) tablet 50 mcg (has no administration in time range)  pantoprazole  (PROTONIX ) EC tablet 40 mg (has no administration in time range)  melatonin tablet 3 mg (has no administration in time range)  lamoTRIgine  (LAMICTAL ) tablet 25 mg (has no administration in time range)  levETIRAcetam  (KEPPRA ) tablet 1,000 mg (has no administration in time range)  pregabalin  (LYRICA ) capsule 25 mg (has no administration in time range)   stroke: early stages of recovery book (has no administration in time range)  0.9 %  sodium chloride  infusion (has no administration in time range)  acetaminophen  (TYLENOL ) tablet 650 mg (has no administration in time range)    Or  acetaminophen  (TYLENOL ) 160 MG/5ML solution 650 mg (has no administration in time range)    Or  acetaminophen  (TYLENOL ) suppository 650 mg (has no administration in time range)  iohexol  (OMNIPAQUE ) 350 MG/ML injection 100 mL (100 mLs Intravenous Contrast Given 03/23/24 1142)    ED Course/ Medical Decision Making/ A&P Clinical Course as of 03/23/24 1704  Wed Mar 23, 2024  1230 Per Dr. Christiane Cowing patient is now saying that she may have woke up at 8 AM with the symptoms.  Unclear when the last known normal was.  CT and CTA without acute findings.  Did not feel that patient needs TNK or interventional radiology. Concerned that there may be conversion disorder. Still recommend admission  for MRI and aspirin and keppra . Does have hx of old stroke.  [RP]  1329 Dr Lydia Sams to admit. [RP]    Clinical Course User Index [RP] Ninetta Basket, MD                                 Medical Decision Making Amount and/or Complexity of Data Reviewed Labs: ordered. Radiology: ordered.  Risk Decision regarding hospitalization.   Sheryl Hall is a 63 y.o. female with comorbidities that complicate the patient evaluation including stroke and seizure who presents to the emergency department with lower extremity weakness left greater than right  Initial Ddx:  Stroke, ICH, spinal cord compression, generalized weakness  MDM/Course:  Patient presents emergency department with  lower extremity weakness.  Reports left greater than right.  Initially had some difficulty clarifying the timeline but believes that her symptoms started at 8 AM after she was awake.  Was evaluated by neurology as a code stroke activation.  She later said that her symptoms were present when she was awake placing her out of the TNK window.  It was thought that her symptoms were likely due to either stroke or conversion disorder.  Not complaining of any back pain that would suggest a spinal cord depression.  Upon re-evaluation patient had persistent weakness.  Is somewhat anxious which could fit with conversion disorder.  Admitted to hospitalist for further evaluation.  May need PT OT as well.  This patient presents to the ED for concern of complaints listed in HPI, this involves an extensive number of treatment options, and is a complaint that carries with it a high risk of complications and morbidity. Disposition including potential need for admission considered.   Dispo: Admit to Floor  Records reviewed Outpatient Clinic Notes The following labs were independently interpreted: Chemistry and show no acute abnormality I independently reviewed the following imaging with scope of interpretation limited to determining  acute life threatening conditions related to emergency care: CT Head and agree with the radiologist interpretation with the following exceptions: none I personally reviewed and interpreted cardiac monitoring: normal sinus rhythm  I personally reviewed and interpreted the pt's EKG: see above for interpretation  I have reviewed the patients home medications and made adjustments as needed Consults: Neurology  Portions of this note were generated with Dragon dictation software. Dictation errors may occur despite best attempts at proofreading.    CRITICAL CARE Performed by: Ninetta Basket   Total critical care time: 30 minutes  Critical care time was exclusive of separately billable procedures and treating other patients.  Critical care was necessary to treat or prevent imminent or life-threatening deterioration.  Critical care was time spent personally by me on the following activities: development of treatment plan with patient and/or surrogate as well as nursing, discussions with consultants, evaluation of patient's response to treatment, examination of patient, obtaining history from patient or surrogate, ordering and performing treatments and interventions, ordering and review of laboratory studies, ordering and review of radiographic studies, pulse oximetry and re-evaluation of patient's condition.   Final Clinical Impression(s) / ED Diagnoses Final diagnoses:  Left leg weakness  Stroke-like symptoms    Rx / DC Orders ED Discharge Orders     None         Ninetta Basket, MD 03/23/24 1704

## 2024-03-23 NOTE — Care Management Obs Status (Signed)
 MEDICARE OBSERVATION STATUS NOTIFICATION   Patient Details  Name: Sheryl Hall MRN: [id number] Date of Birth: Apr 23, 1961   Medicare Observation Status Notification Given:  Yes    Geraldina Klinefelter, RN 03/23/2024, 4:22 PM

## 2024-03-24 ENCOUNTER — Observation Stay (HOSPITAL_COMMUNITY): Payer: MEDICAID

## 2024-03-24 DIAGNOSIS — I700 Atherosclerosis of aorta: Secondary | ICD-10-CM | POA: Diagnosis not present

## 2024-03-24 DIAGNOSIS — I639 Cerebral infarction, unspecified: Secondary | ICD-10-CM | POA: Diagnosis not present

## 2024-03-24 LAB — ECHOCARDIOGRAM COMPLETE
AR max vel: 2.42 cm2
AV Area VTI: 2.32 cm2
AV Area mean vel: 2.15 cm2
AV Mean grad: 4.9 mmHg
AV Peak grad: 9.2 mmHg
Ao pk vel: 1.52 m/s
Area-P 1/2: 2.6 cm2
Height: 66 in
S' Lateral: 2.8 cm
Weight: 2960 [oz_av]

## 2024-03-24 LAB — URINALYSIS, W/ REFLEX TO CULTURE (INFECTION SUSPECTED)
Bilirubin Urine: NEGATIVE
Glucose, UA: NEGATIVE mg/dL
Hgb urine dipstick: NEGATIVE
Ketones, ur: 5 mg/dL — AB
Nitrite: NEGATIVE
Protein, ur: 100 mg/dL — AB
Specific Gravity, Urine: 1.033 — ABNORMAL HIGH (ref 1.005–1.030)
pH: 5 (ref 5.0–8.0)

## 2024-03-24 LAB — HIV ANTIBODY (ROUTINE TESTING W REFLEX): HIV Screen 4th Generation wRfx: NONREACTIVE

## 2024-03-24 MED ORDER — ATORVASTATIN CALCIUM 40 MG PO TABS
40.0000 mg | ORAL_TABLET | Freq: Every day | ORAL | Status: DC
  Administered 2024-03-25 – 2024-03-31 (×7): 40 mg via ORAL
  Filled 2024-03-24 (×7): qty 1

## 2024-03-24 MED ORDER — NYSTATIN 100000 UNIT/GM EX POWD
Freq: Two times a day (BID) | CUTANEOUS | Status: DC
  Administered 2024-03-30: 1 via TOPICAL
  Filled 2024-03-24 (×3): qty 15

## 2024-03-24 MED ORDER — ROSUVASTATIN CALCIUM 20 MG PO TABS: 40.0000 mg | ORAL_TABLET | Freq: Every day | ORAL | Status: DC

## 2024-03-24 MED ORDER — SIMVASTATIN 20 MG PO TABS
40.0000 mg | ORAL_TABLET | Freq: Every day | ORAL | Status: DC
  Administered 2024-03-24: 40 mg via ORAL
  Filled 2024-03-24: qty 2

## 2024-03-24 MED ORDER — ATORVASTATIN CALCIUM 40 MG PO TABS: 40.0000 mg | ORAL_TABLET | Freq: Every day | ORAL | Status: DC

## 2024-03-24 MED ORDER — ASPIRIN 81 MG PO TBEC
81.0000 mg | DELAYED_RELEASE_TABLET | Freq: Every day | ORAL | Status: DC
Start: 2024-03-24 — End: 2024-04-01
  Administered 2024-03-24 – 2024-03-31 (×8): 81 mg via ORAL
  Filled 2024-03-24 (×8): qty 1

## 2024-03-24 NOTE — Plan of Care (Signed)
  Problem: Acute Rehab PT Goals(only PT should resolve) Goal: Pt Will Go Supine/Side To Sit Outcome: Progressing Flowsheets (Taken 03/24/2024 1230) Pt will go Supine/Side to Sit: with modified independence Goal: Patient Will Transfer Sit To/From Stand Outcome: Progressing Flowsheets (Taken 03/24/2024 1230) Patient will transfer sit to/from stand: with minimal assist Goal: Pt Will Transfer Bed To Chair/Chair To Bed Outcome: Progressing Flowsheets (Taken 03/24/2024 1230) Pt will Transfer Bed to Chair/Chair to Bed: with min assist Goal: Pt Will Ambulate Outcome: Progressing Flowsheets (Taken 03/24/2024 1230) Pt will Ambulate:  25 feet  with minimal assist  with rolling walker  with moderate assist   12:30 PM, 03/24/24 Walton Guppy, MPT Physical Therapist with [facility] [phone] office [phone] mobile phone

## 2024-03-24 NOTE — Evaluation (Signed)
 Physical Therapy Evaluation Patient Details Name: Sheryl Hall MRN: [id number] DOB: 06-28-1961 Today's Date: 03/24/2024  History of Present Illness  Sheryl Hall is a 63 y.o. female with medical history significant for hypertension, dyslipidemia, prior CVA, GERD, hypothyroidism, bipolar disorder, anxiety/depression, and arthritis who presented to the ED for left-sided weakness and numbness and difficulty walking that began at approximately 8 AM when she woke up.  She normally uses wheelchair at home, but is able to transfer from wheelchair to couch and was not able to do so today.  She called her sister at 44 AM and she was told to call 911 and presented to the ED.  She was not able to move her left side arm or leg and had numbness upon arrival of EMS.  She had mild headache, but denied any speech changes or vision changes.  She was otherwise noted to be her usual self when she went to sleep on her couch last night.  She states that she is otherwise compliant with her home medications.  She follows with Dr. Godwin Lat at [facility] and last seizure was about 2 years ago   Clinical Impression  Patient demonstrates slow labored movement for sitting up at bedside, once seated had difficulty maintaining sitting balance due to posterior leaning and sliding forward, very unsteady on feet having to lean on armrest of chair during transfers, able to take a few steps forward/backwards using RW, but limited mostly due to fatigue, shakiness of extremities and fear of falling. Patient tolerated sitting up in chair after therapy - nursing staff notified. Patient will benefit from continued skilled physical therapy in hospital and recommended venue below to increase strength, balance, endurance for safe ADLs and gait.         If plan is discharge home, recommend the following: A lot of help with bathing/dressing/bathroom;A lot of help with walking and/or transfers;Help with stairs or ramp for entrance;Assistance with  cooking/housework   Can travel by private vehicle   No    Equipment Recommendations None recommended by PT  Recommendations for Other Services       Functional Status Assessment Patient has had a recent decline in their functional status and demonstrates the ability to make significant improvements in function in a reasonable and predictable amount of time.     Precautions / Restrictions Precautions Precautions: Fall Recall of Precautions/Restrictions: Intact Restrictions Weight Bearing Restrictions Per Provider Order: No      Mobility  Bed Mobility Overal bed mobility: Needs Assistance Bed Mobility: Supine to Sit     Supine to sit: Supervision, Contact guard     General bed mobility comments: once seated had occasional posterior leaning and c/o sliding forward    Transfers Overall transfer level: Needs assistance   Transfers: Sit to/from Stand, Bed to chair/wheelchair/BSC Sit to Stand: Min assist, Mod assist Stand pivot transfers: Min assist, Mod assist         General transfer comment: unsteasy shaky movement    Ambulation/Gait Ambulation/Gait assistance: Mod assist Gait Distance (Feet): 5 Feet Assistive device: Rolling walker (2 wheels) Gait Pattern/deviations: Decreased step length - right, Decreased step length - left, Decreased stride length Gait velocity: slow     General Gait Details: limited to a few steps forwad/backwards before having to sit due to shaky movement, c/o fatigue and fear of falling  Stairs            Wheelchair Mobility     Tilt Bed    Modified Rankin (Stroke Patients  Only)       Balance Overall balance assessment: Needs assistance Sitting-balance support: Feet supported, No upper extremity supported Sitting balance-Leahy Scale: Fair Sitting balance - Comments: seated at EOB   Standing balance support: During functional activity, No upper extremity supported Standing balance-Leahy Scale: Poor Standing balance  comment: poor using RW                             Pertinent Vitals/Pain Pain Assessment Pain Assessment: Faces Faces Pain Scale: Hurts a little bit Pain Location: B LE Pain Descriptors / Indicators: Pressure Pain Intervention(s): Limited activity within patient's tolerance, Monitored during session, Repositioned    Home Living Family/patient expects to be discharged to:: Private residence Living Arrangements: Alone Available Help at Discharge: Family;Personal care attendant;Available PRN/intermittently;Neighbor Type of Home: Apartment Home Access: Level entry       Home Layout: One level Home Equipment: Agricultural consultant (2 wheels);Grab bars - tub/shower;Wheelchair - manual Additional Comments: aides 2 hrs a day M-F.    Prior Function Prior Level of Function : Needs assist       Physical Assist : ADLs (physical);Mobility (physical) Mobility (physical): Gait;Bed mobility;Transfers ADLs (physical): Bathing;Dressing;IADLs Mobility Comments: Attempts to walk with aides using RW; otherwise pt transfers to w/c without AD. ADLs Comments: Assist for bathing, dressing, and IADL's.     Extremity/Trunk Assessment   Upper Extremity Assessment Upper Extremity Assessment: Defer to OT evaluation RUE Deficits / Details: 4/5 grossly; wrist extension 4-/5. Generally weak. RUE Sensation: WNL RUE Coordination: WNL LUE Deficits / Details: 4/5 grossly; 4-/5 elbow extenion and wrist extension. LUE Sensation: decreased light touch LUE Coordination: WNL    Lower Extremity Assessment Lower Extremity Assessment: Generalized weakness;LLE deficits/detail LLE Deficits / Details: grossly -4/5 LLE Sensation: decreased proprioception;history of peripheral neuropathy LLE Coordination: decreased gross motor    Cervical / Trunk Assessment Cervical / Trunk Assessment: Normal  Communication   Communication Communication: No apparent difficulties    Cognition Arousal: Alert Behavior  During Therapy: WFL for tasks assessed/performed                             Following commands: Intact       Cueing Cueing Techniques: Verbal cues     General Comments      Exercises     Assessment/Plan    PT Assessment Patient needs continued PT services  PT Problem List Decreased strength;Decreased activity tolerance;Decreased balance;Decreased mobility       PT Treatment Interventions DME instruction;Gait training;Functional mobility training;Therapeutic activities;Therapeutic exercise;Balance training;Wheelchair mobility training;Patient/family education    PT Goals (Current goals can be found in the Care Plan section)  Acute Rehab PT Goals Patient Stated Goal: return home after rehab PT Goal Formulation: With patient Time For Goal Achievement: 04/07/24 Potential to Achieve Goals: Good    Frequency Min 3X/week     Co-evaluation PT/OT/SLP Co-Evaluation/Treatment: Yes Reason for Co-Treatment: To address functional/ADL transfers PT goals addressed during session: Mobility/safety with mobility;Balance;Proper use of DME OT goals addressed during session: ADL's and self-care       AM-PAC PT "6 Clicks" Mobility  Outcome Measure Help needed turning from your back to your side while in a flat bed without using bedrails?: A Little Help needed moving from lying on your back to sitting on the side of a flat bed without using bedrails?: A Little Help needed moving to and from a bed to a chair (  including a wheelchair)?: A Lot Help needed standing up from a chair using your arms (e.g., wheelchair or bedside chair)?: A Lot Help needed to walk in hospital room?: A Lot Help needed climbing 3-5 steps with a railing? : Total 6 Click Score: 13    End of Session   Activity Tolerance: Patient tolerated treatment well;Patient limited by fatigue Patient left: in chair;with call bell/phone within reach Nurse Communication: Mobility status PT Visit Diagnosis:  Unsteadiness on feet (R26.81);Other abnormalities of gait and mobility (R26.89);Muscle weakness (generalized) (M62.81)    Time: 1610-9604 PT Time Calculation (min) (ACUTE ONLY): 29 min   Charges:   PT Evaluation $PT Eval Moderate Complexity: 1 Mod PT Treatments $Therapeutic Activity: 23-37 mins PT General Charges $$ ACUTE PT VISIT: 1 Visit         12:28 PM, 03/24/24 Walton Guppy, MPT Physical Therapist with [facility] [phone] office [phone] mobile phone

## 2024-03-24 NOTE — Progress Notes (Signed)
 Transition of Care (TOC) -30 day Note        Transition of Care [facility]) CM/SW Contact  Name: Linnea Richards Phone Number: [phone]  MUST ID: [id number]   To Whom it May Concern:   Please be advised that the above patient will require a short-term nursing home stay, anticipated 30 days or less rehabilitation and strengthening. The plan is for return home.

## 2024-03-24 NOTE — TOC Progression Note (Signed)
 Transition of Care [facility]) - Progression Note    Patient Details  Name: Sheryl Hall MRN: [id number] Date of Birth: 03-14-1961  Transition of Care [facility]) CM/SW Contact  [name], RN Phone Number: 03/24/2024, 2:03 PM  Clinical Narrative:   No bed offers, PASSR received -[id number]    Expected Discharge Plan: Skilled Nursing Facility Barriers to Discharge: Continued Medical Work up  Expected Discharge Plan and Services In-house Referral: Clinical Social Work Discharge Planning Services: CM Consult Post Acute Care Choice: Skilled Nursing Facility Living arrangements for the past 2 months: Single Family Home                                       Social Determinants of Health (SDOH) Interventions SDOH Screenings   Food Insecurity: No Food Insecurity (03/23/2024)  Housing: Low Risk  (03/23/2024)  Transportation Needs: Unmet Transportation Needs (03/23/2024)  Utilities: Not At Risk (03/23/2024)  Alcohol Screen: Low Risk  (07/18/2021)  Depression (PHQ2-9): High Risk (10/20/2023)  Social Connections: Socially Isolated (03/23/2024)  Tobacco Use: Medium Risk (03/23/2024)    Readmission Risk Interventions     No data to display

## 2024-03-24 NOTE — Plan of Care (Signed)

## 2024-03-24 NOTE — TOC Progression Note (Signed)
 Transition of Care [facility]) - Progression Note    Patient Details  Name: Sheryl Hall MRN: [id number] Date of Birth: [birth year]  Transition of Care [facility]) CM/SW Contact  Grandville Lax, [place] Phone Number: 03/24/2024, 11:01 AM  Clinical Narrative:    TOC updated that PT is recommending SNF for pt at D/C. TOC met with pt at bedside to review PT recommendations, pt is agreeable to SNF and prefers to stay local to [place] or [place]. SNF referral sent out. TOC to follow with pt on bed offers. TOC to follow.   Expected Discharge Plan: Skilled Nursing Facility Barriers to Discharge: Continued Medical Work up  Expected Discharge Plan and Services In-house Referral: Clinical Social Work Discharge Planning Services: CM Consult Post Acute Care Choice: Skilled Nursing Facility Living arrangements for the past 2 months: Single Family Home                                       Social Determinants of Health (SDOH) Interventions SDOH Screenings   Food Insecurity: No Food Insecurity (03/23/2024)  Housing: Low Risk  (03/23/2024)  Transportation Needs: Unmet Transportation Needs (03/23/2024)  Utilities: Not At Risk (03/23/2024)  Alcohol Screen: Low Risk  (07/18/2021)  Depression (PHQ2-9): High Risk (10/20/2023)  Social Connections: Socially Isolated (03/23/2024)  Tobacco Use: Medium Risk (03/23/2024)    Readmission Risk Interventions     No data to display

## 2024-03-24 NOTE — Progress Notes (Signed)
 Mobility Specialist Progress Note:    03/24/24 1000  Mobility  Activity Transferred to/from BSC;Stood at bedside  Level of Assistance Moderate assist, patient does 50-74%  Assistive Device Front wheel walker  Distance Ambulated (ft) 2 ft  Range of Motion/Exercises Active;All extremities  Activity Response Tolerated well  Mobility visit 1 Mobility  Mobility Specialist Start Time (ACUTE ONLY) 0945  Mobility Specialist Stop Time (ACUTE ONLY) 1000  Mobility Specialist Time Calculation (min) (ACUTE ONLY) 15 min   Pt received in chair, nurse in room requesting assistance to transfer. Required ModA to stand and transfer with RW. Tolerated well, asx throughout. Returned pt to chair, all needs met.  Glinda Lapping Mobility Specialist Please contact via Special educational needs teacher or  Rehab office at [phone]

## 2024-03-24 NOTE — NC FL2 (Signed)
 [place]  MEDICAID FL2 LEVEL OF CARE FORM     IDENTIFICATION  Patient Name: Sheryl Hall Birthdate: 1961/08/01 Sex: female Admission Date (Current Location): 03/23/2024  [facility] and [place] Number:  [employer] and Address:  [facility],  [address] [place] [address]      Provider Number: [id number]  Attending Physician Name and Address:  Cornelius Dill, DO  Relative Name and Phone Number:  Sheryl Hall [phone] (sister)    Current Level of Care: Hospital Recommended Level of Care: Skilled Nursing Facility Prior Approval Number:    Date Approved/Denied:   PASRR Number:    Discharge Plan: SNF    Current Diagnoses: Patient Active Problem List   Diagnosis Date Noted   CVA (cerebral vascular accident) (HCC) 03/23/2024   Muscle weakness 10/20/2023   Gait disorder 05/19/2023   Polyneuropathy 05/19/2023   Weakness generalized 03/03/2023   Generalized weakness 03/02/2023   Hypokalemia 03/02/2023   Hypomagnesemia 03/02/2023   Closed fracture of proximal end of right humerus with routine healing 01/01/2022   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   Avascular necrosis of femur head, right (HCC) 05/23/2015   Constipation 09/20/2014   Loose stools 08/02/2014   Postoperative anemia due to acute blood loss 04/20/2014   Avascular necrosis of femur head, left (HCC) 04/19/2014   OA (osteoarthritis) of hip 04/19/2014   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   Pain in limb 12/19/2013   History of ischemic stroke 12/19/2013   Acute confusional state 03/24/2013   Low back pain    PTSD (post-traumatic stress disorder) 03/04/2013   Bipolar disorder (HCC) 03/04/2013   Anxiety    Depression    Stroke (HCC)    Hyperthyroidism    Hyperlipidemia    Folate deficiency    Hx of abnormal Pap smear    Abdominal bloating 11/05/2011   IBS (irritable bowel syndrome) 11/05/2011   Chronic diarrhea 09/18/2011   Gastroparesis  09/18/2011   Drug abuse (HCC) 09/18/2011   Tubular adenoma of colon 09/18/2011   Family hx of colon cancer 09/18/2011   COLONIC POLYPS, ADENOMATOUS 07/08/2010   DIZZINESS 07/08/2010   GERD 05/16/2010   WEIGHT LOSS 05/16/2010   NAUSEA WITH VOMITING 05/16/2010   Dysphagia 05/16/2010   DIARRHEA 05/16/2010   Abdominal pain 05/16/2010    Orientation RESPIRATION BLADDER Height & Weight     Self, Time, Situation, Place  Normal Continent Weight: 185 lb (83.9 kg) Height:  5\' 6"  (167.6 cm)  BEHAVIORAL SYMPTOMS/MOOD NEUROLOGICAL BOWEL NUTRITION STATUS      Continent Diet (soft diet; thin liquids)  AMBULATORY STATUS COMMUNICATION OF NEEDS Skin   Extensive Assist Verbally Other (Comment) (rash to right breast)                       Personal Care Assistance Level of Assistance  Bathing, Feeding, Dressing Bathing Assistance: Limited assistance Feeding assistance: Independent Dressing Assistance: Limited assistance     Functional Limitations Info  Sight, Hearing, Speech Sight Info: Adequate Hearing Info: Adequate Speech Info: Adequate    SPECIAL CARE FACTORS FREQUENCY  PT (By licensed PT), OT (By licensed OT)     PT Frequency: 5 days/week OT Frequency: 5 days/week            Contractures Contractures Info: Not present    Additional Factors Info  Code Status, Allergies Code Status Info: Full Allergies Info: Lamictal  (Lamotrigine )           Current  Medications (03/24/2024):  This is the current hospital active medication list Current Facility-Administered Medications  Medication Dose Route Frequency Provider Last Rate Last Admin    stroke: early stages of recovery book   Does not apply Once Mason Sole, Pratik D, DO       0.9 %  sodium chloride  infusion   Intravenous Continuous Shah, Pratik D, DO 40 mL/hr at 03/24/24 0350 Infusion Verify at 03/24/24 0350   acetaminophen  (TYLENOL ) tablet 650 mg  650 mg Oral Q4H PRN Shah, Pratik D, DO   650 mg at 03/23/24 2135   Or    acetaminophen  (TYLENOL ) 160 MG/5ML solution 650 mg  650 mg Per Tube Q4H PRN Mason Sole, Pratik D, DO       Or   acetaminophen  (TYLENOL ) suppository 650 mg  650 mg Rectal Q4H PRN Mason Sole, Pratik D, DO       ARIPiprazole  (ABILIFY ) tablet 2 mg  2 mg Oral QHS Mason Sole, Pratik D, DO   2 mg at 03/23/24 2136   aspirin  EC tablet 81 mg  81 mg Oral Daily Shah, Pratik D, DO   81 mg at 03/24/24 0948   [START ON 03/25/2024] atorvastatin  (LIPITOR) tablet 40 mg  40 mg Oral Daily Yadav, Priyanka O, MD       escitalopram  (LEXAPRO ) tablet 10 mg  10 mg Oral Daily Mason Sole, Pratik D, DO   10 mg at 03/24/24 0981   lamoTRIgine  (LAMICTAL ) tablet 25 mg  25 mg Oral BID Shah, Pratik D, DO   25 mg at 03/24/24 1914   levETIRAcetam  (KEPPRA ) tablet 1,000 mg  1,000 mg Oral BID Shah, Pratik D, DO   1,000 mg at 03/24/24 7829   levothyroxine  (SYNTHROID ) tablet 50 mcg  50 mcg Oral Q0600 Shah, Pratik D, DO   50 mcg at 03/24/24 0533   melatonin tablet 3 mg  3 mg Oral QHS Mason Sole, Pratik D, DO   3 mg at 03/23/24 2135   nystatin  (MYCOSTATIN /NYSTOP ) topical powder   Topical BID Mason Sole, Pratik D, DO       pantoprazole  (PROTONIX ) EC tablet 40 mg  40 mg Oral Daily Mason Sole, Pratik D, DO   40 mg at 03/24/24 5621   pregabalin  (LYRICA ) capsule 25 mg  25 mg Oral BID Shah, Pratik D, DO   25 mg at 03/24/24 3086     Discharge Medications: Please see discharge summary for a list of discharge medications.  Relevant Imaging Results:  Relevant Lab Results:   Additional Information SSN: [id number], [place]

## 2024-03-24 NOTE — Care Plan (Signed)
 Mr brain wo contrast: No stroke. Continue asa 18mg  daily. Switch simvastatin  to atorvastatin  40mg  for goal LDL<70. Discussed with Dr Mason Sole. Please call for any further questions.  Sheryl Hall O Dalicia Kisner

## 2024-03-24 NOTE — Plan of Care (Signed)
  Problem: Acute Rehab OT Goals (only OT should resolve) Goal: Pt. Will Perform Grooming Flowsheets (Taken 03/24/2024 0952) Pt Will Perform Grooming:  with modified independence  standing Goal: Pt. Will Perform Lower Body Bathing Flowsheets (Taken 03/24/2024 0952) Pt Will Perform Lower Body Bathing: with modified independence Goal: Pt. Will Perform Upper Body Dressing Flowsheets (Taken 03/24/2024 0952) Pt Will Perform Upper Body Dressing: with modified independence Goal: Pt. Will Perform Lower Body Dressing Flowsheets (Taken 03/24/2024 0952) Pt Will Perform Lower Body Dressing: with modified independence Goal: Pt. Will Transfer To Toilet Flowsheets (Taken 03/24/2024 [phone]) Pt Will Transfer to Toilet: with modified independence Goal: Pt. Will Perform Toileting-Clothing Manipulation Flowsheets (Taken 03/24/2024 0952) Pt Will Perform Toileting - Clothing Manipulation and hygiene: with modified independence Goal: Pt/Caregiver Will Perform Home Exercise Program Flowsheets (Taken 03/24/2024 [phone]) Pt/caregiver will Perform Home Exercise Program:  Increased strength  Both right and left upper extremity  Independently  Hanadi Stanly OT, MOT

## 2024-03-24 NOTE — Progress Notes (Signed)
 Nurse at bedside,patient alert and oriented times four.Patient out of bed to chair this am.Patient assisted to and from BSC,using front wheel walker times two assist,patient did well.However,patient did c/o burning when voiding,Dr Mason Sole notified. Plan of care on going.

## 2024-03-24 NOTE — Progress Notes (Signed)
 PROGRESS NOTE    DYUTHI EUSTACHE  ZOX:[id number] DOB: Aug 17, 1961 DOA: 03/23/2024 PCP: Austine Lefort, MD   Brief Narrative:    Sheryl Hall is a 63 y.o. female with medical history significant for hypertension, dyslipidemia, prior CVA, GERD, hypothyroidism, bipolar disorder, anxiety/depression, and arthritis who presented to the ED for left-sided weakness and numbness and difficulty walking that began at approximately 8 AM when she woke up.  She normally uses wheelchair at home, but is able to transfer from wheelchair to couch and was not able to do so today.  She was admitted for CVA evaluation and brain MRI negative.  She has been seen by PT who is now recommending SNF placement.  Assessment & Plan:   Principal Problem:   CVA (cerebral vascular accident) (HCC)  Assessment and Plan:   Left-sided hemiparesis concern for TIA versus functional neurological disorder - Appreciate neurology recommendations, brain MRI with no acute findings - 2D echocardiogram as noted below with preserved LVEF - Aspirin  81 mg daily -Atorvastatin  per neurology based on elevated LDL of 136 -A1c 5.8%, continue monitor outpatient -PT recommending SNF placement   Seizure disorder - Continue home Keppra    Bipolar disorder/anxiety/depression - Continue home medications   Hypothyroidism - Continue levothyroxine    GERD - PPI   History of CVA/dyslipidemia - Continue statin and aspirin  as ordered    DVT prophylaxis: SCDs Code Status: Full Family Communication: None at bedside Disposition Plan:  Status is: Observation The patient will require care spanning > 2 midnights and should be moved to inpatient because: Need for inpatient SNF placement.   Consultants:  Neurology  Procedures:  None  Antimicrobials:  None   Subjective: Patient seen and evaluated today with no new acute complaints or concerns. No acute concerns or events noted overnight.  She continues to have significant  weakness to her left side and has been seen by PT and is agreeable for SNF placement.  She is complaining of some rash under her breasts and some burning with urination.  Objective: Vitals:   03/23/24 2355 03/24/24 0448 03/24/24 0941 03/24/24 1311  BP: 124/75 (!) 152/76 101/89 122/75  Pulse: 83 70 83 85  Resp: 18 19    Temp: 98.2 F (36.8 C) 98.2 F (36.8 C) 97.9 F (36.6 C) 98.6 F (37 C)  TempSrc: Oral Oral Oral Oral  SpO2: 90% 94% 92% 96%  Weight:      Height:        Intake/Output Summary (Last 24 hours) at 03/24/2024 1324 Last data filed at 03/24/2024 0612 Gross per 24 hour  Intake 638.34 ml  Output --  Net 638.34 ml   Filed Weights   03/23/24 1215  Weight: 83.9 kg    Examination:  General exam: Appears calm and comfortable  Respiratory system: Clear to auscultation. Respiratory effort normal. Cardiovascular system: S1 & S2 heard, RRR.  Gastrointestinal system: Abdomen is soft Central nervous system: Alert and awake Extremities: No edema Skin: No significant lesions noted Psychiatry: Flat affect.    Data Reviewed: I have personally reviewed following labs and imaging studies  CBC: Recent Labs  Lab 03/23/24 1113  WBC 7.3  NEUTROABS 6.2  HGB 12.3  HCT 41.9  MCV 77.0*  PLT 209   Basic Metabolic Panel: Recent Labs  Lab 03/23/24 1113  NA 139  K 3.8  CL 103  CO2 25  GLUCOSE 115*  BUN 7*  CREATININE 1.01*  CALCIUM  9.1   GFR: Estimated Creatinine Clearance: 63.8 mL/min (A) (by  C-G formula based on SCr of 1.01 mg/dL (H)). Liver Function Tests: Recent Labs  Lab 03/23/24 1113  AST 17  ALT 11  ALKPHOS 109  BILITOT 0.9  PROT 6.8  ALBUMIN 3.8   No results for input(s): "LIPASE", "AMYLASE" in the last 168 hours. No results for input(s): "AMMONIA" in the last 168 hours. Coagulation Profile: No results for input(s): "INR", "PROTIME" in the last 168 hours. Cardiac Enzymes: No results for input(s): "CKTOTAL", "CKMB", "CKMBINDEX", "TROPONINI" in the  last 168 hours. BNP (last 3 results) No results for input(s): "PROBNP" in the last 8760 hours. HbA1C: Recent Labs    03/23/24 1111  HGBA1C 5.8*   CBG: No results for input(s): "GLUCAP" in the last 168 hours. Lipid Profile: Recent Labs    03/23/24 1113  CHOL 198  HDL 48  LDLCALC 136*  TRIG 70  CHOLHDL 4.1   Thyroid  Function Tests: No results for input(s): "TSH", "T4TOTAL", "FREET4", "T3FREE", "THYROIDAB" in the last 72 hours. Anemia Panel: No results for input(s): "VITAMINB12", "FOLATE", "FERRITIN", "TIBC", "IRON", "RETICCTPCT" in the last 72 hours. Sepsis Labs: No results for input(s): "PROCALCITON", "LATICACIDVEN" in the last 168 hours.  No results found for this or any previous visit (from the past 240 hours).       Radiology Studies: ECHOCARDIOGRAM COMPLETE Result Date: 03/24/2024    ECHOCARDIOGRAM REPORT   Patient Name:   Sheryl Hall Date of Exam: 03/24/2024 Medical Rec #:  [id number]       Height:       66.0 in Accession #:    [id number]      Weight:       185.0 lb Date of Birth:  26-Dec-1960        BSA:          1.935 m Patient Age:    62 years        BP:           144/89 mmHg Patient Gender: F               HR:           88 bpm. Exam Location:  [facility] Procedure: 2D Echo, Cardiac Doppler, Color Doppler and Saline Contrast Bubble            Study (Both Spectral and Color Flow Doppler were utilized during            procedure). Indications:    Stroke l63.9  History:        Patient has prior history of Echocardiogram examinations, most                 recent 01/14/2013. Risk Factors:Dyslipidemia. Hx of drug abuse                 and ischemic stroke.  Sonographer:    Denese Finn RCS Referring Phys: [id number] Treyvon Blahut D [facility] IMPRESSIONS  1. Left ventricular ejection fraction, by estimation, is 60 to 65%. The left ventricle has normal function. The left ventricle has no regional wall motion abnormalities. Left ventricular diastolic parameters are consistent with Grade I diastolic  dysfunction (impaired relaxation).  2. Right ventricular systolic function is normal. The right ventricular size is normal.  3. The mitral valve is normal in structure. Trivial mitral valve regurgitation. No evidence of mitral stenosis.  4. The aortic valve is tricuspid. Aortic valve regurgitation is not visualized. No aortic stenosis is present.  5. The inferior vena cava is normal in size with greater than  50% respiratory variability, suggesting right atrial pressure of 3 mmHg.  6. Agitated saline contrast bubble study was negative, with no evidence of any interatrial shunt. FINDINGS  Left Ventricle: Left ventricular ejection fraction, by estimation, is 60 to 65%. The left ventricle has normal function. The left ventricle has no regional wall motion abnormalities. The left ventricular internal cavity size was normal in size. There is  no left ventricular hypertrophy. Left ventricular diastolic parameters are consistent with Grade I diastolic dysfunction (impaired relaxation). Right Ventricle: The right ventricular size is normal. Right vetricular wall thickness was not well visualized. Right ventricular systolic function is normal. Left Atrium: Left atrial size was normal in size. Right Atrium: Right atrial size was normal in size. Pericardium: There is no evidence of pericardial effusion. Mitral Valve: The mitral valve is normal in structure. Trivial mitral valve regurgitation. No evidence of mitral valve stenosis. Tricuspid Valve: The tricuspid valve is normal in structure. Tricuspid valve regurgitation is not demonstrated. No evidence of tricuspid stenosis. Aortic Valve: The aortic valve is tricuspid. Aortic valve regurgitation is not visualized. No aortic stenosis is present. Aortic valve mean gradient measures 4.9 mmHg. Aortic valve peak gradient measures 9.2 mmHg. Aortic valve area, by VTI measures 2.32 cm. Pulmonic Valve: The pulmonic valve was not well visualized. Pulmonic valve regurgitation is not  visualized. No evidence of pulmonic stenosis. Aorta: The aortic root is normal in size and structure. Venous: The inferior vena cava is normal in size with greater than 50% respiratory variability, suggesting right atrial pressure of 3 mmHg. IAS/Shunts: No atrial level shunt detected by color flow Doppler. Agitated saline contrast was given intravenously to evaluate for intracardiac shunting. Agitated saline contrast bubble study was negative, with no evidence of any interatrial shunt.  LEFT VENTRICLE PLAX 2D LVIDd:         4.20 cm   Diastology LVIDs:         2.80 cm   LV e' medial:    7.51 cm/s LV PW:         1.10 cm   LV E/e' medial:  11.5 LV IVS:        1.00 cm   LV e' lateral:   9.14 cm/s LVOT diam:     1.90 cm   LV E/e' lateral: 9.4 LV SV:         73 LV SV Index:   38 LVOT Area:     2.84 cm  RIGHT VENTRICLE RV S prime:     11.30 cm/s TAPSE (M-mode): 2.1 cm LEFT ATRIUM           Index        RIGHT ATRIUM           Index LA diam:      2.90 cm 1.50 cm/m   RA Area:     13.20 cm LA Vol (A2C): 30.6 ml 15.82 ml/m  RA Volume:   34.50 ml  17.83 ml/m LA Vol (A4C): 33.7 ml 17.42 ml/m  AORTIC VALVE AV Area (Vmax):    2.42 cm AV Area (Vmean):   2.15 cm AV Area (VTI):     2.32 cm AV Vmax:           152.01 cm/s AV Vmean:          104.513 cm/s AV VTI:            0.316 m AV Peak Grad:      9.2 mmHg AV Mean Grad:      4.9  mmHg LVOT Vmax:         130.00 cm/s LVOT Vmean:        79.100 cm/s LVOT VTI:          0.259 m LVOT/AV VTI ratio: 0.82  AORTA Ao Root diam: 3.00 cm MITRAL VALVE MV Area (PHT): 2.60 cm     SHUNTS MV Decel Time: 292 msec     Systemic VTI:  0.26 m MV E velocity: 86.10 cm/s   Systemic Diam: 1.90 cm MV A velocity: 105.00 cm/s MV E/A ratio:  0.82 Armida Lander MD Electronically signed by Armida Lander MD Signature Date/Time: 03/24/2024/12:51:19 PM    Final    MR BRAIN WO CONTRAST Result Date: 03/23/2024 CLINICAL DATA:  Left lower extremity weakness EXAM: MRI HEAD WITHOUT CONTRAST TECHNIQUE: Multiplanar,  multiecho pulse sequences of the brain and surrounding structures were obtained without intravenous contrast. COMPARISON:  CT studies same day FINDINGS: Brain: Diffusion imaging does not show any acute or subacute infarction or other cause of restricted diffusion. No focal abnormality affects the brainstem or cerebellum. Cerebral hemispheres show an old cortical and subcortical infarction in the right parietooccipital junction region. There is atrophy and gliosis in this area. No other brain abnormality. No evidence of widespread small-vessel disease. No mass, recent hemorrhage, hydrocephalus or extra-axial collection. Mild hemosiderin deposition at the site of the old right-sided stroke. Vascular: Major vessels at the base of the brain show flow. Skull and upper cervical spine: Negative Sinuses/Orbits: Clear/normal Other: None IMPRESSION: No acute finding. 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 Electronically Signed   By: Bettylou Brunner M.D.   On: 03/23/2024 14:01   CT ANGIO HEAD NECK W WO CM W PERF (CODE STROKE) Result Date: 03/23/2024 CLINICAL DATA:  63 year old female code stroke presentation with lower extremity weakness. EXAM: CT ANGIOGRAPHY HEAD AND NECK CT PERFUSION BRAIN TECHNIQUE: Multidetector CT imaging of the head and neck was performed using the standard protocol during bolus administration of intravenous contrast. Multiplanar CT image reconstructions and MIPs were obtained to evaluate the vascular anatomy. Carotid stenosis measurements (when applicable) are obtained utilizing NASCET criteria, using the distal internal carotid diameter as the denominator. Multiphase CT imaging of the brain was performed following IV bolus contrast injection. Subsequent parametric perfusion maps were calculated using RAPID software. RADIATION DOSE REDUCTION: This exam was performed according to the departmental dose-optimization program which includes automated exposure  control, adjustment of the mA and/or kV according to patient size and/or use of iterative reconstruction technique. CONTRAST:  OMNIPAQUE  IOHEXOL  350 MG/ML SOLN COMPARISON:  Plain head CT today 1135 hours. FINDINGS: CT Brain Perfusion Findings: ASPECTS: 10, chronic encephalomalacia. CBF (<30%) Volume: 0mL Perfusion (Tmax>6.0s) volume: 0mL No CT Perfusion parameter abnormality is detected. Mismatch Volume: Not applicable Infarction Location:Not applicable CTA NECK Skeleton: C4-C5 cervical ACDF with appearance of solid arthrodesis. Some background osteopenia. No acute osseous abnormality identified. Upper chest: Negative. Other neck: Negative aside from partially retropharyngeal course of the right carotids, normal variant. Partial thyroid  atrophy Aortic arch: 3 vessel arch. Mild-to-moderate Calcified aortic atherosclerosis. Right carotid system: Mild brachiocephalic artery soft and calcified plaque without stenosis. Negative right CCA. Minimal plaque at the right carotid bifurcation. Partially retropharyngeal right ICA appears patent and negative to the skull base. Left carotid system: Negative left CCA. Minimal soft plaque at the left carotid bifurcation. Left ICA appears patent and normal to the skull base. Vertebral arteries: Minor plaque at the right subclavian origin without stenosis. Normal right vertebral artery  origin. Right vertebral artery is patent to the skull base with no plaque or stenosis. Proximal left subclavian artery soft plaque at its origin resulting in up to 50% stenosis as seen on series 9, image 124. Left vertebral origin remains normal. Fairly codominant left vertebral arteries patent and normal to the skull base. CTA HEAD Posterior circulation: Codominant distal vertebral arteries, PICA origins, vertebrobasilar junction, basilar artery, SCA and PCA origins appear patent and normal. Posterior communicating arteries are diminutive or absent. Bilateral PCA branches are within normal  limits. Anterior circulation: Both ICA siphons are patent. No significant left siphon plaque, no left siphon stenosis. Minimal right siphon calcified plaque without stenosis. Patent carotid termini. Normal MCA and ACA origins. Normal anterior communicating artery, bilateral ACA branches. Left MCA M1 segment and trifurcation are patent without stenosis. Right MCA M1 segment and bifurcation are patent without stenosis. Bilateral MCA branches are within normal limits. Venous sinuses: Patent. Anatomic variants: None significant. Review of the MIP images confirms the above findings IMPRESSION: 1. Negative CT Perfusion. And CTA is negative for large vessel occlusion. 2. Minimal atherosclerosis in the head and neck. 3. Aortic Atherosclerosis (ICD10-I70.0). 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 4. Prior cervical ACDF with evidence of arthrodesis. #1 communicated to Dr. Christiane Cowing at 12:02 pm on 03/23/2024 by text page via the [facility] messaging system. Electronically Signed   By: Marlise Simpers M.D.   On: 03/23/2024 12:04   CT HEAD CODE STROKE WO CONTRAST Result Date: 03/23/2024 CLINICAL DATA:  Code stroke. 63 year old female with lower extremity weakness. EXAM: CT HEAD WITHOUT CONTRAST TECHNIQUE: Contiguous axial images were obtained from the base of the skull through the vertex without intravenous contrast. RADIATION DOSE REDUCTION: This exam was performed according to the departmental dose-optimization program which includes automated exposure control, adjustment of the mA and/or kV according to patient size and/or use of iterative reconstruction technique. COMPARISON:  Brain MRI 08/06/2023.  Head CT 06/22/2016. FINDINGS: Brain: Cerebral volume not significantly changed since 2017. Chronic posterior right hemisphere encephalomalacia including the junctions of the right inferior parietal, superior occipital and posterior temporal  lobes appears unchanged. No midline shift, ventriculomegaly, mass effect, evidence of mass lesion, intracranial hemorrhage or evidence of cortically based acute infarction. Vascular: No suspicious intracranial vascular hyperdensity. Skull: Mild motion artifact. No acute osseous abnormality identified. Sinuses/Orbits: Visualized paranasal sinuses and mastoids are stable and well aerated. Other: No gaze deviation. No acute orbit or scalp soft tissue finding. ASPECTS [facility] Stroke Program Early CT Score) Total score (0-10 with 10 being normal): 10, chronic encephalomalacia. IMPRESSION: 1. No acute cortically based infarct or acute intracranial hemorrhage identified. ASPECTS 10. 2. Stable chronic right posterior hemisphere encephalomalacia. These results were communicated to Dr. Christiane Cowing at 11:55 am on 03/23/2024 by text page via the [facility] messaging system. Electronically Signed   By: Marlise Simpers M.D.   On: 03/23/2024 11:56        Scheduled Meds:   stroke: early stages of recovery book   Does not apply Once   ARIPiprazole   2 mg Oral QHS   aspirin  EC  81 mg Oral Daily   [START ON 03/25/2024] atorvastatin   40 mg Oral Daily   escitalopram   10 mg Oral Daily   lamoTRIgine   25 mg Oral BID   levETIRAcetam   1,000 mg Oral BID   levothyroxine   50 mcg Oral Q0600   melatonin  3 mg Oral QHS   nystatin   Topical BID   pantoprazole   40 mg Oral Daily   pregabalin   25 mg Oral BID   Continuous Infusions:  sodium chloride  40 mL/hr at 03/24/24 0350     LOS: 0 days    Time spent: 55 minutes    Magdalene Tardiff Loran Rock, DO [facility]  If 7PM-7AM, please contact night-coverage [email or web address] 03/24/2024, 1:24 PM

## 2024-03-24 NOTE — Evaluation (Signed)
 Occupational Therapy Evaluation Patient Details Name: Sheryl Hall MRN: [id number] DOB: 1961-02-20 Today's Date: 03/24/2024   History of Present Illness   Sheryl Hall is a 63 y.o. female with medical history significant for hypertension, dyslipidemia, prior CVA, GERD, hypothyroidism, bipolar disorder, anxiety/depression, and arthritis who presented to the ED for left-sided weakness and numbness and difficulty walking that began at approximately 8 AM when she woke up.  She normally uses wheelchair at home, but is able to transfer from wheelchair to couch and was not able to do so today.  She called her sister at 22 AM and she was told to call 911 and presented to the ED.  She was not able to move her left side arm or leg and had numbness upon arrival of EMS.  She had mild headache, but denied any speech changes or vision changes.  She was otherwise noted to be her usual self when she went to sleep on her couch last night.  She states that she is otherwise compliant with her home medications.  She follows with Dr. Godwin Lat at [facility] and last seizure was about 2 years ago (per DO)     Clinical Impressions Pt agreeable to OT and PT co-evaluation. No physical assist needed for bed mobility. Min to mod A for stand pivot to chair without AD but two hand held assist. Pt able to amblate with min A using RW for a few steps in the room with reports of L LE feeling weak. Pt has weakness in B UE with reports of decreased sensation in L LE and L UE. Pt left in the chair with call bell within reach. Pt will benefit from continued OT in the hospital and recommended venue below to increase strength, balance, and endurance for safe ADL's.        If plan is discharge home, recommend the following:   A lot of help with walking and/or transfers;A little help with bathing/dressing/bathroom;Assistance with cooking/housework;Assist for transportation;Help with stairs or ramp for entrance     Functional Status  Assessment   Patient has had a recent decline in their functional status and demonstrates the ability to make significant improvements in function in a reasonable and predictable amount of time.     Equipment Recommendations   None recommended by OT     Recommendations for Other Services         Precautions/Restrictions   Precautions Precautions: Fall Recall of Precautions/Restrictions: Intact Restrictions Weight Bearing Restrictions Per Provider Order: No     Mobility Bed Mobility Overal bed mobility: Needs Assistance Bed Mobility: Supine to Sit     Supine to sit: Modified independent (Device/Increase time)          Transfers Overall transfer level: Needs assistance   Transfers: Sit to/from Stand, Bed to chair/wheelchair/BSC Sit to Stand: Min assist, Mod assist Stand pivot transfers: Min assist, Mod assist         General transfer comment: hand held assist for stand pivot to the chair without RW      Balance Overall balance assessment: Needs assistance Sitting-balance support: No upper extremity supported, Feet supported Sitting balance-Leahy Scale: Fair Sitting balance - Comments: seated at EOB   Standing balance support: Bilateral upper extremity supported, During functional activity Standing balance-Leahy Scale: Poor Standing balance comment: without AD; fair with RW                           ADL either performed  or assessed with clinical judgement   ADL Overall ADL's : Needs assistance/impaired     Grooming: Set up;Sitting   Upper Body Bathing: Set up;Sitting   Lower Body Bathing: Set up;Contact guard assist;Bed level   Upper Body Dressing : Set up;Sitting   Lower Body Dressing: Set up;Contact guard assist;Bed level Lower Body Dressing Details (indicate cue type and reason): Able to don socks while in long sit position on the bed. Toilet Transfer: Minimal assistance;Moderate assistance;Stand-pivot Toilet Transfer Details  (indicate cue type and reason): Simualted via EOB to chair transfer without AD. Toileting- Clothing Manipulation and Hygiene: Contact guard assist;Minimal assistance;Sitting/lateral lean       Functional mobility during ADLs: Minimal assistance;Rolling walker (2 wheels) General ADL Comments: Min A to ambulate ~6 feet in the room with RW.     Vision Baseline Vision/History: 1 Wears glasses Ability to See in Adequate Light: 1 Impaired Patient Visual Report: Blurring of vision Vision Assessment?: No apparent visual deficits Additional Comments: Pt reports continued blurry vision but has no problem reading the baord in the room.     Perception Perception: Not tested       Praxis Praxis: Not tested       Pertinent Vitals/Pain Pain Assessment Pain Assessment: Faces Faces Pain Scale: Hurts a little bit Pain Location: B LE Pain Descriptors / Indicators: Pressure Pain Intervention(s): Limited activity within patient's tolerance, Monitored during session, Repositioned     Extremity/Trunk Assessment Upper Extremity Assessment Upper Extremity Assessment: Generalized weakness;RUE deficits/detail;LUE deficits/detail RUE Deficits / Details: 4/5 grossly; wrist extension 4-/5. Generally weak. RUE Sensation: WNL RUE Coordination: WNL LUE Deficits / Details: 4/5 grossly; 4-/5 elbow extenion and wrist extension. LUE Sensation: decreased light touch LUE Coordination: WNL   Lower Extremity Assessment Lower Extremity Assessment: Defer to PT evaluation   Cervical / Trunk Assessment Cervical / Trunk Assessment: Normal   Communication Communication Communication: No apparent difficulties   Cognition Arousal: Alert Behavior During Therapy: WFL for tasks assessed/performed Cognition: No apparent impairments                               Following commands: Intact       Cueing  General Comments   Cueing Techniques: Verbal cues                 Home Living  Family/patient expects to be discharged to:: Private residence Living Arrangements: Alone Available Help at Discharge: Family;Personal care attendant;Available PRN/intermittently;Neighbor Type of Home: Apartment Home Access: Level entry     Home Layout: One level     Bathroom Shower/Tub: Tub/shower unit;Sponge bathes at baseline   Bathroom Toilet: Handicapped height Bathroom Accessibility: Yes   Home Equipment: Agricultural consultant (2 wheels);Grab bars - tub/shower;Wheelchair - manual   Additional Comments: aides 2 hrs a day M-F.      Prior Functioning/Environment Prior Level of Function : Needs assist       Physical Assist : ADLs (physical);Mobility (physical) Mobility (physical): Gait ADLs (physical): Bathing;Dressing;IADLs Mobility Comments: Attempts to walk with aides using RW; otherwise pt transfers to w/c without AD. ADLs Comments: Assist for bathing, dressing, and IADL's.    OT Problem List: Decreased strength;Decreased range of motion;Decreased activity tolerance;Impaired balance (sitting and/or standing)   OT Treatment/Interventions: Self-care/ADL training;Therapeutic exercise;Therapeutic activities;Patient/family education;Balance training;Neuromuscular education      OT Goals(Current goals can be found in the care plan section)   Acute Rehab OT Goals Patient Stated Goal: improve function at  rehab; walk OT Goal Formulation: With patient Time For Goal Achievement: 04/07/24 Potential to Achieve Goals: Good   OT Frequency:  Min 2X/week    Co-evaluation PT/OT/SLP Co-Evaluation/Treatment: Yes Reason for Co-Treatment: To address functional/ADL transfers   OT goals addressed during session: ADL's and self-care      AM-PAC OT "6 Clicks" Daily Activity     Outcome Measure Help from another person eating meals?: None Help from another person taking care of personal grooming?: A Little Help from another person toileting, which includes using toliet, bedpan, or  urinal?: A Little Help from another person bathing (including washing, rinsing, drying)?: A Little Help from another person to put on and taking off regular upper body clothing?: A Little Help from another person to put on and taking off regular lower body clothing?: A Little 6 Click Score: 19   End of Session Equipment Utilized During Treatment: Rolling walker (2 wheels)  Activity Tolerance: Patient tolerated treatment well Patient left: in chair;with call bell/phone within reach  OT Visit Diagnosis: Unsteadiness on feet (R26.81);Other abnormalities of gait and mobility (R26.89);Muscle weakness (generalized) (M62.81);Other symptoms and signs involving the nervous system (N82.956)                Time: 2130-8657 OT Time Calculation (min): 18 min Charges:  OT General Charges $OT Visit: 1 Visit OT Evaluation $OT Eval Low Complexity: 1 Low  Delfin Squillace OT, MOT   Thurnell Floss 03/24/2024, 9:50 AM

## 2024-03-25 ENCOUNTER — Encounter (HOSPITAL_COMMUNITY): Payer: Self-pay | Admitting: Internal Medicine

## 2024-03-25 DIAGNOSIS — I639 Cerebral infarction, unspecified: Secondary | ICD-10-CM | POA: Diagnosis not present

## 2024-03-25 MED ORDER — HYDROXYZINE HCL 25 MG PO TABS
25.0000 mg | ORAL_TABLET | Freq: Three times a day (TID) | ORAL | Status: DC | PRN
  Administered 2024-03-25 – 2024-03-31 (×14): 25 mg via ORAL
  Filled 2024-03-25 (×18): qty 1

## 2024-03-25 MED ORDER — POLYETHYLENE GLYCOL 3350 17 G PO PACK
17.0000 g | PACK | Freq: Every day | ORAL | Status: DC
  Administered 2024-03-25 – 2024-03-31 (×7): 17 g via ORAL
  Filled 2024-03-25 (×7): qty 1

## 2024-03-25 NOTE — Progress Notes (Signed)
 PROGRESS NOTE    Sheryl Hall  KKX:[id number] DOB: 10/24/1961 DOA: 03/23/2024 PCP: Austine Lefort, MD   Brief Narrative:    Sheryl Hall is a 63 y.o. female with medical history significant for hypertension, dyslipidemia, prior CVA, GERD, hypothyroidism, bipolar disorder, anxiety/depression, and arthritis who presented to the ED for left-sided weakness and numbness and difficulty walking that began at approximately 8 AM when she woke up.  She normally uses wheelchair at home, but is able to transfer from wheelchair to couch and was not able to do so today.  She was admitted for CVA evaluation and brain MRI negative.  She has been seen by PT who is now recommending SNF placement, but unfortunately she cannot be placed given her issues with insurance.  Assessment & Plan:   Principal Problem:   CVA (cerebral vascular accident) (HCC)  Assessment and Plan:   Left-sided hemiparesis concern for TIA versus functional neurological disorder - Appreciate neurology recommendations, brain MRI with no acute findings - 2D echocardiogram as noted below with preserved LVEF - Aspirin  81 mg daily -Atorvastatin  per neurology based on elevated LDL of 136 -A1c 5.8%, continue monitor outpatient -PT recommending SNF placement, she cannot be placed given insurance issues and will need ongoing PT evaluation.   Seizure disorder - Continue home Keppra    Bipolar disorder/anxiety/depression - Continue home medications   Hypothyroidism - Continue levothyroxine    GERD - PPI   History of CVA/dyslipidemia - Continue statin and aspirin  as ordered    DVT prophylaxis: SCDs Code Status: Full Family Communication: None at bedside Disposition Plan:  Status is: Observation The patient will require care spanning > 2 midnights and should be moved to inpatient because: Need for inpatient SNF placement.  Consultants:  Neurology  Procedures:  None  Antimicrobials:  None   Subjective: Patient  seen and evaluated today with no new acute complaints or concerns. No acute concerns or events noted overnight.  She is complaining of some constipation and has some mild anxiety issues today.  Objective: Vitals:   03/24/24 2223 03/25/24 0324 03/25/24 0928 03/25/24 1350  BP: 130/74 138/76 (!) 157/76 (!) 122/107  Pulse: 82 79 80 85  Resp: 20 18 (!) 21 14  Temp: 98.1 F (36.7 C) 98.2 F (36.8 C) 98.3 F (36.8 C) 98.3 F (36.8 C)  TempSrc: Oral Oral Oral Oral  SpO2: 94% 95% 96% 95%  Weight:      Height:        Intake/Output Summary (Last 24 hours) at 03/25/2024 1506 Last data filed at 03/25/2024 0537 Gross per 24 hour  Intake 240 ml  Output --  Net 240 ml   Filed Weights   03/23/24 1215  Weight: 83.9 kg    Examination:  General exam: Appears calm and comfortable  Respiratory system: Clear to auscultation. Respiratory effort normal. Cardiovascular system: S1 & S2 heard, RRR.  Gastrointestinal system: Abdomen is soft Central nervous system: Alert and awake Extremities: No edema Skin: No significant lesions noted Psychiatry: Flat affect.    Data Reviewed: I have personally reviewed following labs and imaging studies  CBC: Recent Labs  Lab 03/23/24 1113  WBC 7.3  NEUTROABS 6.2  HGB 12.3  HCT 41.9  MCV 77.0*  PLT 209   Basic Metabolic Panel: Recent Labs  Lab 03/23/24 1113  NA 139  K 3.8  CL 103  CO2 25  GLUCOSE 115*  BUN 7*  CREATININE 1.01*  CALCIUM  9.1   GFR: Estimated Creatinine Clearance: 63.8 mL/min (A) (  by C-G formula based on SCr of 1.01 mg/dL (H)). Liver Function Tests: Recent Labs  Lab 03/23/24 1113  AST 17  ALT 11  ALKPHOS 109  BILITOT 0.9  PROT 6.8  ALBUMIN 3.8   No results for input(s): "LIPASE", "AMYLASE" in the last 168 hours. No results for input(s): "AMMONIA" in the last 168 hours. Coagulation Profile: No results for input(s): "INR", "PROTIME" in the last 168 hours. Cardiac Enzymes: No results for input(s): "CKTOTAL", "CKMB",  "CKMBINDEX", "TROPONINI" in the last 168 hours. BNP (last 3 results) No results for input(s): "PROBNP" in the last 8760 hours. HbA1C: Recent Labs    03/23/24 1111  HGBA1C 5.8*   CBG: No results for input(s): "GLUCAP" in the last 168 hours. Lipid Profile: Recent Labs    03/23/24 1113  CHOL 198  HDL 48  LDLCALC 136*  TRIG 70  CHOLHDL 4.1   Thyroid  Function Tests: No results for input(s): "TSH", "T4TOTAL", "FREET4", "T3FREE", "THYROIDAB" in the last 72 hours. Anemia Panel: No results for input(s): "VITAMINB12", "FOLATE", "FERRITIN", "TIBC", "IRON", "RETICCTPCT" in the last 72 hours. Sepsis Labs: No results for input(s): "PROCALCITON", "LATICACIDVEN" in the last 168 hours.  No results found for this or any previous visit (from the past 240 hours).       Radiology Studies: ECHOCARDIOGRAM COMPLETE Result Date: 03/24/2024    ECHOCARDIOGRAM REPORT   Patient Name:   Sheryl Hall Date of Exam: 03/24/2024 Medical Rec #:  [id number]       Height:       66.0 in Accession #:    [id number]      Weight:       185.0 lb Date of Birth:  12-08-1960        BSA:          1.935 m Patient Age:    62 years        BP:           144/89 mmHg Patient Gender: F               HR:           88 bpm. Exam Location:  [facility] Procedure: 2D Echo, Cardiac Doppler, Color Doppler and Saline Contrast Bubble            Study (Both Spectral and Color Flow Doppler were utilized during            procedure). Indications:    Stroke l63.9  History:        Patient has prior history of Echocardiogram examinations, most                 recent 01/14/2013. Risk Factors:Dyslipidemia. Hx of drug abuse                 and ischemic stroke.  Sonographer:    Denese Finn RCS Referring Phys: [id number] Harini Dearmond D [facility] IMPRESSIONS  1. Left ventricular ejection fraction, by estimation, is 60 to 65%. The left ventricle has normal function. The left ventricle has no regional wall motion abnormalities. Left ventricular diastolic parameters are  consistent with Grade I diastolic dysfunction (impaired relaxation).  2. Right ventricular systolic function is normal. The right ventricular size is normal.  3. The mitral valve is normal in structure. Trivial mitral valve regurgitation. No evidence of mitral stenosis.  4. The aortic valve is tricuspid. Aortic valve regurgitation is not visualized. No aortic stenosis is present.  5. The inferior vena cava is normal in size with greater  than 50% respiratory variability, suggesting right atrial pressure of 3 mmHg.  6. Agitated saline contrast bubble study was negative, with no evidence of any interatrial shunt. FINDINGS  Left Ventricle: Left ventricular ejection fraction, by estimation, is 60 to 65%. The left ventricle has normal function. The left ventricle has no regional wall motion abnormalities. The left ventricular internal cavity size was normal in size. There is  no left ventricular hypertrophy. Left ventricular diastolic parameters are consistent with Grade I diastolic dysfunction (impaired relaxation). Right Ventricle: The right ventricular size is normal. Right vetricular wall thickness was not well visualized. Right ventricular systolic function is normal. Left Atrium: Left atrial size was normal in size. Right Atrium: Right atrial size was normal in size. Pericardium: There is no evidence of pericardial effusion. Mitral Valve: The mitral valve is normal in structure. Trivial mitral valve regurgitation. No evidence of mitral valve stenosis. Tricuspid Valve: The tricuspid valve is normal in structure. Tricuspid valve regurgitation is not demonstrated. No evidence of tricuspid stenosis. Aortic Valve: The aortic valve is tricuspid. Aortic valve regurgitation is not visualized. No aortic stenosis is present. Aortic valve mean gradient measures 4.9 mmHg. Aortic valve peak gradient measures 9.2 mmHg. Aortic valve area, by VTI measures 2.32 cm. Pulmonic Valve: The pulmonic valve was not well visualized.  Pulmonic valve regurgitation is not visualized. No evidence of pulmonic stenosis. Aorta: The aortic root is normal in size and structure. Venous: The inferior vena cava is normal in size with greater than 50% respiratory variability, suggesting right atrial pressure of 3 mmHg. IAS/Shunts: No atrial level shunt detected by color flow Doppler. Agitated saline contrast was given intravenously to evaluate for intracardiac shunting. Agitated saline contrast bubble study was negative, with no evidence of any interatrial shunt.  LEFT VENTRICLE PLAX 2D LVIDd:         4.20 cm   Diastology LVIDs:         2.80 cm   LV e' medial:    7.51 cm/s LV PW:         1.10 cm   LV E/e' medial:  11.5 LV IVS:        1.00 cm   LV e' lateral:   9.14 cm/s LVOT diam:     1.90 cm   LV E/e' lateral: 9.4 LV SV:         73 LV SV Index:   38 LVOT Area:     2.84 cm  RIGHT VENTRICLE RV S prime:     11.30 cm/s TAPSE (M-mode): 2.1 cm LEFT ATRIUM           Index        RIGHT ATRIUM           Index LA diam:      2.90 cm 1.50 cm/m   RA Area:     13.20 cm LA Vol (A2C): 30.6 ml 15.82 ml/m  RA Volume:   34.50 ml  17.83 ml/m LA Vol (A4C): 33.7 ml 17.42 ml/m  AORTIC VALVE AV Area (Vmax):    2.42 cm AV Area (Vmean):   2.15 cm AV Area (VTI):     2.32 cm AV Vmax:           152.01 cm/s AV Vmean:          104.513 cm/s AV VTI:            0.316 m AV Peak Grad:      9.2 mmHg AV Mean Grad:  4.9 mmHg LVOT Vmax:         130.00 cm/s LVOT Vmean:        79.100 cm/s LVOT VTI:          0.259 m LVOT/AV VTI ratio: 0.82  AORTA Ao Root diam: 3.00 cm MITRAL VALVE MV Area (PHT): 2.60 cm     SHUNTS MV Decel Time: 292 msec     Systemic VTI:  0.26 m MV E velocity: 86.10 cm/s   Systemic Diam: 1.90 cm MV A velocity: 105.00 cm/s MV E/A ratio:  0.82 Armida Lander MD Electronically signed by Armida Lander MD Signature Date/Time: 03/24/2024/12:51:19 PM    Final         Scheduled Meds:  ARIPiprazole   2 mg Oral QHS   aspirin  EC  81 mg Oral Daily   atorvastatin   40 mg  Oral Daily   escitalopram   10 mg Oral Daily   lamoTRIgine   25 mg Oral BID   levETIRAcetam   1,000 mg Oral BID   levothyroxine   50 mcg Oral Q0600   melatonin  3 mg Oral QHS   nystatin    Topical BID   pantoprazole   40 mg Oral Daily   polyethylene glycol  17 g Oral Daily   pregabalin   25 mg Oral BID     LOS: 0 days    Time spent: 55 minutes    Jyoti Harju Loran Rock, DO [facility]  If 7PM-7AM, please contact night-coverage [email or web address] 03/25/2024, 3:06 PM

## 2024-03-25 NOTE — Plan of Care (Signed)

## 2024-03-25 NOTE — Plan of Care (Signed)

## 2024-03-25 NOTE — TOC Progression Note (Addendum)
 Transition of Care [facility]) - Progression Note    Patient Details  Name: [name] MRN: [id number] Date of Birth: 01/20/1961  [facility] CM/SW Contact  [name], RN Phone Number: 03/25/2024, 2:17 PM  Clinical Narrative:   [name] called back to say they can no longer accept [name]. TOC check with other facilities, They also are not accepting due to no payments on previous patient with [name].  CM spoke with patient, she does not feel like she can go home without rehab. She is agreeable to allow her SSI check go to the facility for 30 days of rehab. CM discussed LOG for [name] for therapy and room and board. Waiting for management to responded to the request.   Addendum: Management did not approve LOG. Team updated.   Expected Discharge Plan: Skilled Nursing Facility Barriers to Discharge: Continued Medical Work up  Expected Discharge Plan and Services In-house Referral: Clinical Social Work Discharge Planning Services: CM Consult Post Acute Care Choice: Skilled Nursing Facility Living arrangements for the past 2 months: Single Family Home          Social Determinants of Health (SDOH) Interventions SDOH Screenings   Food Insecurity: No Food Insecurity (03/23/2024)  Housing: Low Risk  (03/23/2024)  Transportation Needs: Unmet Transportation Needs (03/23/2024)  Utilities: Not At Risk (03/23/2024)  Alcohol Screen: Low Risk  (07/18/2021)  Depression (PHQ2-9): High Risk (10/20/2023)  Social Connections: Socially Isolated (03/23/2024)  Tobacco Use: Medium Risk (03/23/2024)    Readmission Risk Interventions     No data to display

## 2024-03-26 DIAGNOSIS — I639 Cerebral infarction, unspecified: Secondary | ICD-10-CM | POA: Diagnosis not present

## 2024-03-26 MED ORDER — OXYBUTYNIN CHLORIDE 5 MG PO TABS
2.5000 mg | ORAL_TABLET | Freq: Three times a day (TID) | ORAL | Status: DC
  Administered 2024-03-26 – 2024-03-31 (×17): 2.5 mg via ORAL
  Filled 2024-03-26 (×17): qty 1

## 2024-03-26 MED ORDER — BISACODYL 10 MG RE SUPP
10.0000 mg | Freq: Once | RECTAL | Status: DC
  Filled 2024-03-26: qty 1

## 2024-03-26 NOTE — Progress Notes (Signed)
 PROGRESS NOTE    Sheryl Hall  ZOX:[id number] DOB: 24-Mar-1961 DOA: 03/23/2024 PCP: Austine Lefort, MD   Brief Narrative:    Sheryl Hall is a 63 y.o. female with medical history significant for hypertension, dyslipidemia, prior CVA, GERD, hypothyroidism, bipolar disorder, anxiety/depression, and arthritis who presented to the ED for left-sided weakness and numbness and difficulty walking that began at approximately 8 AM when she woke up.  She normally uses wheelchair at home, but is able to transfer from wheelchair to couch and was not able to do so today.  She was admitted for CVA evaluation and brain MRI negative.  She has been seen by PT who is now recommending SNF placement, but unfortunately she cannot be placed given her issues with insurance.  Assessment & Plan:   Principal Problem:   CVA (cerebral vascular accident) (HCC)  Assessment and Plan:   Left-sided hemiparesis concern for TIA versus functional neurological disorder - Appreciate neurology recommendations, brain MRI with no acute findings - 2D echocardiogram as noted below with preserved LVEF - Aspirin  81 mg daily -Atorvastatin  per neurology based on elevated LDL of 136 -A1c 5.8%, continue monitor outpatient -PT recommending SNF placement, she cannot be placed given insurance issues and will need ongoing PT evaluation.   Seizure disorder - Continue home Keppra    Bipolar disorder/anxiety/depression - Continue home medications   Hypothyroidism - Continue levothyroxine    GERD - PPI   History of CVA/dyslipidemia - Continue statin and aspirin  as ordered  Overactive bladder -Start oxybutynin 5/3  Constipation -Give bisacodyl  suppository 5/3    DVT prophylaxis: SCDs Code Status: Full Family Communication: None at bedside Disposition Plan:  Status is: Observation The patient will require care spanning > 2 midnights and should be moved to inpatient because: Need for inpatient SNF  placement.  Consultants:  Neurology  Procedures:  None  Antimicrobials:  None   Subjective: Patient seen and evaluated today with concerns of urinary urgency and bladder irritation along with constipation.  She continues to await SNF placement and has ongoing weakness to her extremities.  Objective: Vitals:   03/25/24 2039 03/25/24 2121 03/26/24 0028 03/26/24 0500  BP: (!) 146/74 136/64 (!) 131/54 136/74  Pulse: 77 83 78 71  Resp: 20 20 20 19   Temp: 98 F (36.7 C) 97.7 F (36.5 C) 97.6 F (36.4 C) 97.8 F (36.6 C)  TempSrc: Oral Oral Oral Oral  SpO2: 96% 95% 92% 94%  Weight:      Height:       No intake or output data in the 24 hours ending 03/26/24 1008  Filed Weights   03/23/24 1215  Weight: 83.9 kg    Examination:  General exam: Appears calm and comfortable  Respiratory system: Clear to auscultation. Respiratory effort normal. Cardiovascular system: S1 & S2 heard, RRR.  Gastrointestinal system: Abdomen is soft Central nervous system: Alert and awake Extremities: No edema Skin: No significant lesions noted Psychiatry: Flat affect.    Data Reviewed: I have personally reviewed following labs and imaging studies  CBC: Recent Labs  Lab 03/23/24 1113  WBC 7.3  NEUTROABS 6.2  HGB 12.3  HCT 41.9  MCV 77.0*  PLT 209   Basic Metabolic Panel: Recent Labs  Lab 03/23/24 1113  NA 139  K 3.8  CL 103  CO2 25  GLUCOSE 115*  BUN 7*  CREATININE 1.01*  CALCIUM  9.1   GFR: Estimated Creatinine Clearance: 63.8 mL/min (A) (by C-G formula based on SCr of 1.01 mg/dL (H)). Liver  Function Tests: Recent Labs  Lab 03/23/24 1113  AST 17  ALT 11  ALKPHOS 109  BILITOT 0.9  PROT 6.8  ALBUMIN 3.8   No results for input(s): "LIPASE", "AMYLASE" in the last 168 hours. No results for input(s): "AMMONIA" in the last 168 hours. Coagulation Profile: No results for input(s): "INR", "PROTIME" in the last 168 hours. Cardiac Enzymes: No results for input(s):  "CKTOTAL", "CKMB", "CKMBINDEX", "TROPONINI" in the last 168 hours. BNP (last 3 results) No results for input(s): "PROBNP" in the last 8760 hours. HbA1C: Recent Labs    03/23/24 1111  HGBA1C 5.8*   CBG: No results for input(s): "GLUCAP" in the last 168 hours. Lipid Profile: Recent Labs    03/23/24 1113  CHOL 198  HDL 48  LDLCALC 136*  TRIG 70  CHOLHDL 4.1   Thyroid  Function Tests: No results for input(s): "TSH", "T4TOTAL", "FREET4", "T3FREE", "THYROIDAB" in the last 72 hours. Anemia Panel: No results for input(s): "VITAMINB12", "FOLATE", "FERRITIN", "TIBC", "IRON", "RETICCTPCT" in the last 72 hours. Sepsis Labs: No results for input(s): "PROCALCITON", "LATICACIDVEN" in the last 168 hours.  No results found for this or any previous visit (from the past 240 hours).       Radiology Studies: ECHOCARDIOGRAM COMPLETE Result Date: 03/24/2024    ECHOCARDIOGRAM REPORT   Patient Name:   Sheryl Hall Date of Exam: 03/24/2024 Medical Rec #:  [id number]       Height:       66.0 in Accession #:    [id number]      Weight:       185.0 lb Date of Birth:  August 06, 1961        BSA:          1.935 m Patient Age:    62 years        BP:           144/89 mmHg Patient Gender: F               HR:           88 bpm. Exam Location:  [facility] Procedure: 2D Echo, Cardiac Doppler, Color Doppler and Saline Contrast Bubble            Study (Both Spectral and Color Flow Doppler were utilized during            procedure). Indications:    Stroke l63.9  History:        Patient has prior history of Echocardiogram examinations, most                 recent 01/14/2013. Risk Factors:Dyslipidemia. Hx of drug abuse                 and ischemic stroke.  Sonographer:    Denese Finn RCS Referring Phys: [id number] Falicia Lizotte D [facility] IMPRESSIONS  1. Left ventricular ejection fraction, by estimation, is 60 to 65%. The left ventricle has normal function. The left ventricle has no regional wall motion abnormalities. Left ventricular diastolic  parameters are consistent with Grade I diastolic dysfunction (impaired relaxation).  2. Right ventricular systolic function is normal. The right ventricular size is normal.  3. The mitral valve is normal in structure. Trivial mitral valve regurgitation. No evidence of mitral stenosis.  4. The aortic valve is tricuspid. Aortic valve regurgitation is not visualized. No aortic stenosis is present.  5. The inferior vena cava is normal in size with greater than 50% respiratory variability, suggesting right atrial pressure of 3 mmHg.  6. Agitated saline contrast bubble study was negative, with no evidence of any interatrial shunt. FINDINGS  Left Ventricle: Left ventricular ejection fraction, by estimation, is 60 to 65%. The left ventricle has normal function. The left ventricle has no regional wall motion abnormalities. The left ventricular internal cavity size was normal in size. There is  no left ventricular hypertrophy. Left ventricular diastolic parameters are consistent with Grade I diastolic dysfunction (impaired relaxation). Right Ventricle: The right ventricular size is normal. Right vetricular wall thickness was not well visualized. Right ventricular systolic function is normal. Left Atrium: Left atrial size was normal in size. Right Atrium: Right atrial size was normal in size. Pericardium: There is no evidence of pericardial effusion. Mitral Valve: The mitral valve is normal in structure. Trivial mitral valve regurgitation. No evidence of mitral valve stenosis. Tricuspid Valve: The tricuspid valve is normal in structure. Tricuspid valve regurgitation is not demonstrated. No evidence of tricuspid stenosis. Aortic Valve: The aortic valve is tricuspid. Aortic valve regurgitation is not visualized. No aortic stenosis is present. Aortic valve mean gradient measures 4.9 mmHg. Aortic valve peak gradient measures 9.2 mmHg. Aortic valve area, by VTI measures 2.32 cm. Pulmonic Valve: The pulmonic valve was not well  visualized. Pulmonic valve regurgitation is not visualized. No evidence of pulmonic stenosis. Aorta: The aortic root is normal in size and structure. Venous: The inferior vena cava is normal in size with greater than 50% respiratory variability, suggesting right atrial pressure of 3 mmHg. IAS/Shunts: No atrial level shunt detected by color flow Doppler. Agitated saline contrast was given intravenously to evaluate for intracardiac shunting. Agitated saline contrast bubble study was negative, with no evidence of any interatrial shunt.  LEFT VENTRICLE PLAX 2D LVIDd:         4.20 cm   Diastology LVIDs:         2.80 cm   LV e' medial:    7.51 cm/s LV PW:         1.10 cm   LV E/e' medial:  11.5 LV IVS:        1.00 cm   LV e' lateral:   9.14 cm/s LVOT diam:     1.90 cm   LV E/e' lateral: 9.4 LV SV:         73 LV SV Index:   38 LVOT Area:     2.84 cm  RIGHT VENTRICLE RV S prime:     11.30 cm/s TAPSE (M-mode): 2.1 cm LEFT ATRIUM           Index        RIGHT ATRIUM           Index LA diam:      2.90 cm 1.50 cm/m   RA Area:     13.20 cm LA Vol (A2C): 30.6 ml 15.82 ml/m  RA Volume:   34.50 ml  17.83 ml/m LA Vol (A4C): 33.7 ml 17.42 ml/m  AORTIC VALVE AV Area (Vmax):    2.42 cm AV Area (Vmean):   2.15 cm AV Area (VTI):     2.32 cm AV Vmax:           152.01 cm/s AV Vmean:          104.513 cm/s AV VTI:            0.316 m AV Peak Grad:      9.2 mmHg AV Mean Grad:      4.9 mmHg LVOT Vmax:  130.00 cm/s LVOT Vmean:        79.100 cm/s LVOT VTI:          0.259 m LVOT/AV VTI ratio: 0.82  AORTA Ao Root diam: 3.00 cm MITRAL VALVE MV Area (PHT): 2.60 cm     SHUNTS MV Decel Time: 292 msec     Systemic VTI:  0.26 m MV E velocity: 86.10 cm/s   Systemic Diam: 1.90 cm MV A velocity: 105.00 cm/s MV E/A ratio:  0.82 Armida Lander MD Electronically signed by Armida Lander MD Signature Date/Time: 03/24/2024/12:51:19 PM    Final         Scheduled Meds:  ARIPiprazole   2 mg Oral QHS   aspirin  EC  81 mg Oral Daily    atorvastatin   40 mg Oral Daily   bisacodyl   10 mg Rectal Once   escitalopram   10 mg Oral Daily   lamoTRIgine   25 mg Oral BID   levETIRAcetam   1,000 mg Oral BID   levothyroxine   50 mcg Oral Q0600   melatonin  3 mg Oral QHS   nystatin    Topical BID   oxybutynin  2.5 mg Oral TID   pantoprazole   40 mg Oral Daily   polyethylene glycol  17 g Oral Daily   pregabalin   25 mg Oral BID     LOS: 0 days    Time spent: 55 minutes    Lanee Chain Loran Rock, DO [facility]  If 7PM-7AM, please contact night-coverage [email or web address] 03/26/2024, 10:08 AM

## 2024-03-27 DIAGNOSIS — E785 Hyperlipidemia, unspecified: Secondary | ICD-10-CM | POA: Diagnosis not present

## 2024-03-27 DIAGNOSIS — G459 Transient cerebral ischemic attack, unspecified: Principal | ICD-10-CM

## 2024-03-27 DIAGNOSIS — R2990 Unspecified symptoms and signs involving the nervous system: Secondary | ICD-10-CM

## 2024-03-27 LAB — RAPID URINE DRUG SCREEN, HOSP PERFORMED
Amphetamines: NOT DETECTED
Barbiturates: NOT DETECTED
Benzodiazepines: NOT DETECTED
Cocaine: NOT DETECTED
Opiates: NOT DETECTED
Tetrahydrocannabinol: POSITIVE — AB

## 2024-03-27 MED ORDER — CYANOCOBALAMIN 1000 MCG/ML IJ SOLN
1000.0000 ug | Freq: Once | INTRAMUSCULAR | Status: AC
  Administered 2024-03-27: 1000 ug via INTRAMUSCULAR
  Filled 2024-03-27: qty 1

## 2024-03-27 MED ORDER — OXYCODONE HCL 5 MG PO TABS
5.0000 mg | ORAL_TABLET | Freq: Once | ORAL | Status: AC | PRN
  Administered 2024-03-28: 5 mg via ORAL
  Filled 2024-03-27: qty 1

## 2024-03-27 NOTE — Progress Notes (Signed)
 Physical Therapy Treatment Patient Details Name: Sheryl Hall MRN: [id number] DOB: 04/04/1961 Today's Date: 03/27/2024   History of Present Illness Sheryl Hall is a 63 y.o. female with medical history significant for hypertension, dyslipidemia, prior CVA, GERD, hypothyroidism, bipolar disorder, anxiety/depression, and arthritis who presented to the ED for left-sided weakness and numbness and difficulty walking that began at approximately 8 AM when she woke up.  She normally uses wheelchair at home, but is able to transfer from wheelchair to couch and was not able to do so today.  She called her sister at 66 AM and she was told to call 911 and presented to the ED.  She was not able to move her left side arm or leg and had numbness upon arrival of EMS.  She had mild headache, but denied any speech changes or vision changes.  She was otherwise noted to be her usual self when she went to sleep on her couch last night.  She states that she is otherwise compliant with her home medications.  She follows with Dr. Godwin Lat at [facility] and last seizure was about 2 years ago    PT Comments  Patient lying in bed; alert on therapist arrival.  She is agreeable to therapy treatment.  Patient performs supine to sit with SBA/CGA and is able to fully come to the edge of the bed to sit.  Once sitting on edge of bed she tends to lean backwards but can balance herself is she uses her hands to assist.  Patient perform sit to stand to RW with CGA/min A but relies heavily on her upper extremities to maintain balance.  Patient legs are weak and noticeably shake and tremor with standing.  She is able to take a few steps over to the chair with min A with RW.  Once sitting, patient performs seated UE and lower extremity therapeutic exercises.  patient left in chair with chair alarm set;  call button in reach and nursing present. Patient will benefit from continued skilled therapy services during the remainder of her hospital stay and at  the next recommended venue of care to address deficits and promote return to optimal function.         If plan is discharge home, recommend the following: A lot of help with bathing/dressing/bathroom;A lot of help with walking and/or transfers;Help with stairs or ramp for entrance;Assistance with cooking/housework   Can travel by private vehicle     No  Equipment Recommendations  None recommended by PT    Recommendations for Other Services       Precautions / Restrictions Precautions Precautions: Fall Recall of Precautions/Restrictions: Intact Restrictions Weight Bearing Restrictions Per Provider Order: No     Mobility  Bed Mobility Overal bed mobility: Needs Assistance Bed Mobility: Supine to Sit     Supine to sit: Supervision, Contact guard     General bed mobility comments: able to sit on edge of bed with SBA/CGA; tends to lean trunk backwards Patient Response: Cooperative  Transfers Overall transfer level: Needs assistance Equipment used: Rolling walker (2 wheels) Transfers: Sit to/from Stand, Bed to chair/wheelchair/BSC Sit to Stand: Contact guard assist, Min assist   Step pivot transfers: Min assist, Contact guard assist       General transfer comment: unsteasy shaky movement    Ambulation/Gait Ambulation/Gait assistance: Contact guard assist, Min assist Gait Distance (Feet): 2 Feet Assistive device: Rolling walker (2 wheels) Gait Pattern/deviations: Decreased step length - right, Decreased step length - left, Decreased stride  length Gait velocity: decreased     General Gait Details: patient leans heavily on upper extremities to maintain standing with RW   Stairs             Wheelchair Mobility     Tilt Bed Tilt Bed Patient Response: Cooperative  Modified Rankin (Stroke Patients Only)       Balance Overall balance assessment: Needs assistance Sitting-balance support: No upper extremity supported, Feet supported Sitting  balance-Leahy Scale: Fair Sitting balance - Comments: seated at EOB   Standing balance support: Bilateral upper extremity supported, During functional activity Standing balance-Leahy Scale: Poor Standing balance comment: without AD; fair with RW                            Communication Communication Communication: No apparent difficulties  Cognition Arousal: Alert Behavior During Therapy: WFL for tasks assessed/performed                             Following commands: Intact      Cueing    Exercises Other Exercises Other Exercises: sitting ankle pumps, hand pumps, shoulder punch upwards, quad sets, hip abduction    General Comments        Pertinent Vitals/Pain Pain Assessment Pain Score: 9  Pain Location: both legs Pain Descriptors / Indicators: Heaviness Pain Intervention(s): Monitored during session, Repositioned    Home Living                          Prior Function            PT Goals (current goals can now be found in the care plan section) Acute Rehab PT Goals Patient Stated Goal: return home after rehab PT Goal Formulation: With patient Time For Goal Achievement: 04/07/24 Potential to Achieve Goals: Good Progress towards PT goals: Progressing toward goals    Frequency    Min 3X/week      PT Plan      Co-evaluation PT/OT/SLP Co-Evaluation/Treatment: Yes Reason for Co-Treatment: To address functional/ADL transfers PT goals addressed during session: Mobility/safety with mobility;Balance;Proper use of DME        AM-PAC PT "6 Clicks" Mobility   Outcome Measure  Help needed turning from your back to your side while in a flat bed without using bedrails?: A Little Help needed moving from lying on your back to sitting on the side of a flat bed without using bedrails?: A Little Help needed moving to and from a bed to a chair (including a wheelchair)?: A Lot Help needed standing up from a chair using your arms  (e.g., wheelchair or bedside chair)?: A Lot Help needed to walk in hospital room?: A Lot Help needed climbing 3-5 steps with a railing? : Total 6 Click Score: 13    End of Session Equipment Utilized During Treatment: Gait belt Activity Tolerance: Patient tolerated treatment well;Patient limited by fatigue Patient left: in chair;with call bell/phone within reach;with chair alarm set;with nursing/sitter in room Nurse Communication: Mobility status PT Visit Diagnosis: Unsteadiness on feet (R26.81);Other abnormalities of gait and mobility (R26.89);Muscle weakness (generalized) (M62.81)     Time: 8295-6213 PT Time Calculation (min) (ACUTE ONLY): 20 min  Charges:    $Therapeutic Activity: 8-22 mins PT General Charges $$ ACUTE PT VISIT: 1 Visit  9:22 AM, 03/27/24 Dorothee Napierkowski Small Lilliam Chamblee MPT [facility] [place] [phone]

## 2024-03-27 NOTE — Progress Notes (Signed)
 PROGRESS NOTE    Sheryl Hall  YQM:[id number] DOB: Nov 08, 1961 DOA: 03/23/2024 PCP: Austine Lefort, MD   Brief Narrative:    Sheryl Hall is a 63 y.o. female with medical history significant for hypertension, dyslipidemia, prior CVA, GERD, hypothyroidism, bipolar disorder, anxiety/depression, and arthritis who presented to the ED for left-sided weakness and numbness and difficulty walking that began at approximately 8 AM when she woke up.  She normally uses wheelchair at home, but is able to transfer from wheelchair to couch and was not able to do so today.  She was admitted for CVA evaluation and brain MRI negative.  She has been seen by PT who is now recommending SNF placement, but unfortunately she cannot be placed given her issues with insurance.  Patient is medically stable for discharge.  Assessment & Plan:   Principal Problem:   CVA (cerebral vascular accident) (HCC)  Assessment and Plan:  Left-sided hemiparesis concern for TIA versus functional neurological disorder - Appreciate neurology recommendations, brain MRI with no acute findings. - 2D echocardiogram as noted below with preserved LVEF - Continue aspirin  81 mg daily for secondary prevention. - Continue the use of Lipitor; LDL 136. -A1c 5.8%, continue monitor outpatient; modified carbohydrate diet discussed with patient. -PT recommending SNF placement, she cannot be placed given insurance issues and will need ongoing PT evaluation. -TOC aware and helping with discharge plans.   Seizure disorder - Continue home Keppra  -No seizure activity appreciated.   Bipolar disorder/anxiety/depression - Continue home medications -No suicidal ideation or hallucination.   Hypothyroidism - Continue levothyroxine    GERD - Continue the use of PPI - Lifestyle changes discussed with patient.     History of CVA/dyslipidemia - Continue statin and aspirin  for secondary prevention  Overactive bladder -continue  oxybutynin  Constipation - Bowel movement achieved with bisacodyl  suppository - Continue to maintain adequate hydration - Patient advised to increase fiber intake.    DVT prophylaxis: SCDs Code Status: Full Family Communication: Sister updated over the phone. Disposition Plan:  Status is: Observation The patient will require care spanning > 2 midnights and should be moved to inpatient because: Need for inpatient SNF placement.  Consultants:  Neurology  Procedures:  None  Antimicrobials:  None   Subjective: In no acute distress.  Afebrile, no chest pain, no nausea, no vomiting, no shortness of breath.  Still reporting intermittent episodes of urinary urgency.  Bowel movement reported after initiation of bowel regimen therapy.  Feeling weak and tired.  Objective: Vitals:   03/26/24 2019 03/27/24 0016 03/27/24 0420 03/27/24 1300  BP: 133/63 (!) 136/55 (!) 143/73 (!) 158/70  Pulse:  74 71 69  Resp: 20 20 20    Temp: 98.1 F (36.7 C) 98.9 F (37.2 C) 97.8 F (36.6 C) 98 F (36.7 C)  TempSrc: Oral Oral Oral Oral  SpO2: 95% 96% 94% 98%  Weight:      Height:        Intake/Output Summary (Last 24 hours) at 03/27/2024 1715 Last data filed at 03/27/2024 1300 Gross per 24 hour  Intake 600 ml  Output --  Net 600 ml    Filed Weights   03/23/24 1215  Weight: 83.9 kg    Examination: General exam: Alert, awake, following commands appropriately and in no acute distress; reporting feeling weak and tired. Respiratory system: Clear to auscultation. Respiratory effort normal.  Good saturation on room air. Cardiovascular system:RRR. No murmurs, rubs, gallops. Gastrointestinal system: Abdomen is nondistended, soft and nontender. No organomegaly or masses  felt. Normal bowel sounds heard. Central nervous system: Moving 4 limbs spontaneously; no focal neurological deficits.  Generally weak. Extremities: No cyanosis or clubbing. Skin: No petechiae. Psychiatry: Judgement and insight  appear normal.  Flat affect appreciated on exam.    Data Reviewed: I have personally reviewed following labs and imaging studies  CBC: Recent Labs  Lab 03/23/24 1113  WBC 7.3  NEUTROABS 6.2  HGB 12.3  HCT 41.9  MCV 77.0*  PLT 209   Basic Metabolic Panel: Recent Labs  Lab 03/23/24 1113  NA 139  K 3.8  CL 103  CO2 25  GLUCOSE 115*  BUN 7*  CREATININE 1.01*  CALCIUM  9.1   GFR: Estimated Creatinine Clearance: 63.8 mL/min (A) (by C-G formula based on SCr of 1.01 mg/dL (H)).  Liver Function Tests: Recent Labs  Lab 03/23/24 1113  AST 17  ALT 11  ALKPHOS 109  BILITOT 0.9  PROT 6.8  ALBUMIN 3.8   Radiology Studies: No results found.  Scheduled Meds:  ARIPiprazole   2 mg Oral QHS   aspirin  EC  81 mg Oral Daily   atorvastatin   40 mg Oral Daily   bisacodyl   10 mg Rectal Once   escitalopram   10 mg Oral Daily   lamoTRIgine   25 mg Oral BID   levETIRAcetam   1,000 mg Oral BID   levothyroxine   50 mcg Oral Q0600   melatonin  3 mg Oral QHS   nystatin    Topical BID   oxybutynin  2.5 mg Oral TID   pantoprazole   40 mg Oral Daily   polyethylene glycol  17 g Oral Daily   pregabalin   25 mg Oral BID     LOS: 0 days    Time spent: 35 minutes  Justina Oman, MD [facility]  If 7PM-7AM, please contact night-coverage [email or web address] 03/27/2024, 5:15 PM

## 2024-03-27 NOTE — Plan of Care (Signed)
   Problem: Education: Goal: Knowledge of General Education information will improve Description Including pain rating scale, medication(s)/side effects and non-pharmacologic comfort measures Outcome: Progressing

## 2024-03-28 DIAGNOSIS — I639 Cerebral infarction, unspecified: Secondary | ICD-10-CM | POA: Diagnosis not present

## 2024-03-28 MED ORDER — OXYCODONE HCL 5 MG PO TABS
5.0000 mg | ORAL_TABLET | Freq: Four times a day (QID) | ORAL | Status: DC | PRN
  Administered 2024-03-28 – 2024-03-31 (×11): 5 mg via ORAL
  Filled 2024-03-28 (×11): qty 1

## 2024-03-28 NOTE — TOC Progression Note (Addendum)
 Transition of Care [facility]) - Progression Note    Patient Details  Name: Sheryl Hall MRN: [id number] Date of Birth: 1960/11/30  [facility]) CM/SW Contact  [name], RN Phone Number: 03/28/2024, 11:53 AM  Clinical Narrative:   CM called [name] at [phone] #[phone].  CM provided [facility] tax ID number. They are no longer listed as a contracted facility with [facility]. CM spoke with Stephenie Einstein she confirmed that her management is no longer accepting patients from [facility].  [facility] could not provide another facility in our region. TOC supervisor [employer] for assistance. TOC following.  ADDENDUM: Supervisor sent a [name] CM list. CM emailed Helen ward for assistance with finding an accepting SNF for this patient.   Expected Discharge Plan: Skilled Nursing Facility Barriers to Discharge: Continued Medical Work up  Expected Discharge Plan and Services In-house Referral: Clinical Social Work Discharge Planning Services: CM Consult Post Acute Care Choice: Skilled Nursing Facility Living arrangements for the past 2 months: Single Family Home                    Social Determinants of Health (SDOH) Interventions SDOH Screenings   Food Insecurity: No Food Insecurity (03/23/2024)  Housing: Low Risk  (03/23/2024)  Transportation Needs: Unmet Transportation Needs (03/23/2024)  Utilities: Not At Risk (03/23/2024)  Alcohol Screen: Low Risk  (07/18/2021)  Depression (PHQ2-9): High Risk (10/20/2023)  Social Connections: Socially Isolated (03/23/2024)  Tobacco Use: Medium Risk (03/25/2024)    Readmission Risk Interventions     No data to display

## 2024-03-28 NOTE — Progress Notes (Signed)
 PROGRESS NOTE    Sheryl Hall  XLK:[id number] DOB: 1961/05/26 DOA: 03/23/2024 PCP: Austine Lefort, MD   Brief Narrative:    Sheryl Hall is a 63 y.o. female with medical history significant for hypertension, dyslipidemia, prior CVA, GERD, hypothyroidism, bipolar disorder, anxiety/depression, and arthritis who presented to the ED for left-sided weakness and numbness and difficulty walking that began at approximately 8 AM when she woke up.  She normally uses wheelchair at home, but is able to transfer from wheelchair to couch and was not able to do so today.  She was admitted for CVA evaluation and brain MRI negative.  She has been seen by PT who is now recommending SNF placement, but unfortunately she cannot be placed given her issues with insurance.  Patient is medically stable for discharge.  Assessment & Plan:   Principal Problem:   CVA (cerebral vascular accident) (HCC)  Assessment and Plan:  Left-sided hemiparesis concern for TIA versus functional neurological disorder - Appreciate neurology recommendations, brain MRI with no acute findings. - 2D echocardiogram as noted below with preserved LVEF - Continue aspirin  81 mg daily for secondary prevention. - Continue the use of Lipitor; LDL 136. -A1c 5.8%, continue monitor outpatient; modified carbohydrate diet discussed with patient. -PT recommending SNF placement, she cannot be placed given insurance issues and will need ongoing PT evaluation. -TOC aware and helping with discharge plans.   Seizure disorder - Continue home Keppra  -No seizure activity appreciated.   Bipolar disorder/anxiety/depression - Continue home medications -No suicidal ideation or hallucination.   Hypothyroidism - Continue levothyroxine    GERD - Continue the use of PPI - Lifestyle changes discussed with patient.     History of CVA/dyslipidemia - Continue statin and aspirin  for secondary prevention  Overactive bladder -continue  oxybutynin  Constipation - Bowel movement achieved with bisacodyl  suppository - Continue to maintain adequate hydration - Patient advised to increase fiber intake.    DVT prophylaxis: SCDs Code Status: Full Family Communication: Sister updated over the phone. Disposition Plan:  Status is: Observation The patient will require care spanning > 2 midnights and should be moved to inpatient because: Need for inpatient SNF placement.  Consultants:  Neurology  Procedures:  None  Antimicrobials:  None   Subjective: Patient seen and evaluated and continues to complain of pain throughout her body.  She does state that she had a bowel movement and her bladder control seems improved.  Objective: Vitals:   03/27/24 0420 03/27/24 1300 03/27/24 2138 03/28/24 0510  BP: (!) 143/73 (!) 158/70 (!) 117/47 132/62  Pulse: 71 69 70 62  Resp: 20  18 16   Temp: 97.8 F (36.6 C) 98 F (36.7 C) 98 F (36.7 C) (!) 97.5 F (36.4 C)  TempSrc: Oral Oral Oral Oral  SpO2: 94% 98% 94% 95%  Weight:      Height:        Intake/Output Summary (Last 24 hours) at 03/28/2024 1349 Last data filed at 03/28/2024 0830 Gross per 24 hour  Intake 300 ml  Output --  Net 300 ml    Filed Weights   03/23/24 1215  Weight: 83.9 kg    Examination: General exam: Alert, awake, following commands appropriately and in no acute distress; reporting feeling weak and tired. Respiratory system: Clear to auscultation. Respiratory effort normal.  Good saturation on room air. Cardiovascular system:RRR. No murmurs, rubs, gallops. Gastrointestinal system: Abdomen is nondistended, soft and nontender. No organomegaly or masses felt. Normal bowel sounds heard. Central nervous system: Moving 4  limbs spontaneously; no focal neurological deficits.  Generally weak. Extremities: No cyanosis or clubbing. Skin: No petechiae. Psychiatry: Judgement and insight appear normal.  Flat affect appreciated on exam.    Data Reviewed: I have  personally reviewed following labs and imaging studies  CBC: Recent Labs  Lab 03/23/24 1113  WBC 7.3  NEUTROABS 6.2  HGB 12.3  HCT 41.9  MCV 77.0*  PLT 209   Basic Metabolic Panel: Recent Labs  Lab 03/23/24 1113  NA 139  K 3.8  CL 103  CO2 25  GLUCOSE 115*  BUN 7*  CREATININE 1.01*  CALCIUM  9.1   GFR: Estimated Creatinine Clearance: 63.8 mL/min (A) (by C-G formula based on SCr of 1.01 mg/dL (H)).  Liver Function Tests: Recent Labs  Lab 03/23/24 1113  AST 17  ALT 11  ALKPHOS 109  BILITOT 0.9  PROT 6.8  ALBUMIN 3.8   Radiology Studies: No results found.  Scheduled Meds:  ARIPiprazole   2 mg Oral QHS   aspirin  EC  81 mg Oral Daily   atorvastatin   40 mg Oral Daily   bisacodyl   10 mg Rectal Once   escitalopram   10 mg Oral Daily   lamoTRIgine   25 mg Oral BID   levETIRAcetam   1,000 mg Oral BID   levothyroxine   50 mcg Oral Q0600   melatonin  3 mg Oral QHS   nystatin    Topical BID   oxybutynin  2.5 mg Oral TID   pantoprazole   40 mg Oral Daily   polyethylene glycol  17 g Oral Daily   pregabalin   25 mg Oral BID     LOS: 0 days    Time spent: 35 minutes  Emmanuelle Coxe Loran Rock, DO [facility]  If 7PM-7AM, please contact night-coverage [email or web address] 03/28/2024, 1:49 PM

## 2024-03-29 DIAGNOSIS — I639 Cerebral infarction, unspecified: Secondary | ICD-10-CM | POA: Diagnosis not present

## 2024-03-29 NOTE — Progress Notes (Signed)
 Physical Therapy Treatment Patient Details Name: Sheryl Hall MRN: [id number] DOB: [birth year] Today's Date: 03/29/2024   History of Present Illness Sheryl Hall is a 63 y.o. female with medical history significant for hypertension, dyslipidemia, prior CVA, GERD, hypothyroidism, bipolar disorder, anxiety/depression, and arthritis who presented to the ED for left-sided weakness and numbness and difficulty walking that began at approximately 8 AM when she woke up.  She normally uses wheelchair at home, but is able to transfer from wheelchair to couch and was not able to do so today.  She called her sister at 78 AM and she was told to call 911 and presented to the ED.  She was not able to move her left side arm or leg and had numbness upon arrival of EMS.  She had mild headache, but denied any speech changes or vision changes.  She was otherwise noted to be her usual self when she went to sleep on her couch last night.  She states that she is otherwise compliant with her home medications.  She follows with Dr. Godwin Lat at [facility] and last seizure was about 2 years ago    PT Comments  Patient agreeable for therapy and demonstrates good return for sitting up at bedside with [facility] raised.  Patient able to ambulate to bathroom with slow labored unsteady movement, incoordination of legs with wide base of support and ataxic like movement.  Patient demonstrated poor tolerance for standing in front of sink to attempt washing hands and had to sit in chair due to loss of balance, fair return for transferring from w/c to chair but had to lean on armrest of chair for support. Patient tolerated sitting up in chair after therapy. Patient will benefit from continued skilled physical therapy in hospital and recommended venue below to increase strength, balance, endurance for safe ADLs and gait.     If plan is discharge home, recommend the following: A lot of help with bathing/dressing/bathroom;A lot of help with walking and/or  transfers;Help with stairs or ramp for entrance;Assistance with cooking/housework   Can travel by private vehicle     No  Equipment Recommendations  None recommended by PT    Recommendations for Other Services       Precautions / Restrictions Precautions Precautions: Fall Recall of Precautions/Restrictions: Intact Restrictions Weight Bearing Restrictions Per Provider Order: No     Mobility  Bed Mobility Overal bed mobility: Modified Independent             General bed mobility comments: HOB elevated    Transfers Overall transfer level: Needs assistance Equipment used: Rolling walker (2 wheels) Transfers: Sit to/from Stand, Bed to chair/wheelchair/BSC Sit to Stand: Contact guard assist, Min assist Stand pivot transfers: Min assist         General transfer comment: w/c to chair with single hand held assist with unstable movement    Ambulation/Gait Ambulation/Gait assistance: Contact guard assist, Min assist Gait Distance (Feet): 15 Feet Assistive device: Rolling walker (2 wheels) Gait Pattern/deviations: Decreased step length - right, Decreased step length - left, Decreased stride length, Ataxic Gait velocity: decreased     General Gait Details: unsteady labored movement with ataxic like movement of legs and posterior leaning once fatigued   Stairs             Wheelchair Mobility     Tilt Bed    Modified Rankin (Stroke Patients Only)       Balance Overall balance assessment: Needs assistance Sitting-balance support: No upper extremity supported,  Feet supported Sitting balance-Leahy Scale: Good Sitting balance - Comments: seated at EOB   Standing balance support: During functional activity, No upper extremity supported Standing balance-Leahy Scale: Poor Standing balance comment: fair/poor using RW                            Communication Communication Communication: No apparent difficulties  Cognition Arousal:  Alert Behavior During Therapy: WFL for tasks assessed/performed   PT - Cognitive impairments: No apparent impairments                                Cueing Cueing Techniques: Verbal cues, Tactile cues  Exercises General Exercises - Lower Extremity Ankle Circles/Pumps: Seated, AROM, Strengthening, Both, 10 reps Long Arc Quad: Seated, AROM, Strengthening, Both, 10 reps Hip Flexion/Marching: Seated, AROM, Strengthening, Both, 10 reps    General Comments        Pertinent Vitals/Pain Pain Assessment Pain Assessment: No/denies pain Faces Pain Scale: Hurts a little bit    Home Living                          Prior Function            PT Goals (current goals can now be found in the care plan section) Acute Rehab PT Goals Patient Stated Goal: return home after rehab PT Goal Formulation: With patient Time For Goal Achievement: 04/07/24 Potential to Achieve Goals: Good Progress towards PT goals: Progressing toward goals    Frequency    Min 3X/week      PT Plan      Co-evaluation PT/OT/SLP Co-Evaluation/Treatment: Yes Reason for Co-Treatment: To address functional/ADL transfers PT goals addressed during session: Mobility/safety with mobility;Balance;Proper use of DME        AM-PAC PT "6 Clicks" Mobility   Outcome Measure  Help needed turning from your back to your side while in a flat bed without using bedrails?: None Help needed moving from lying on your back to sitting on the side of a flat bed without using bedrails?: None Help needed moving to and from a bed to a chair (including a wheelchair)?: A Little Help needed standing up from a chair using your arms (e.g., wheelchair or bedside chair)?: A Little Help needed to walk in hospital room?: A Lot Help needed climbing 3-5 steps with a railing? : Total 6 Click Score: 17    End of Session   Activity Tolerance: Patient tolerated treatment well;Patient limited by fatigue Patient left: in  chair;with call bell/phone within reach;with chair alarm set;with nursing/sitter in room Nurse Communication: Mobility status PT Visit Diagnosis: Unsteadiness on feet (R26.81);Other abnormalities of gait and mobility (R26.89);Muscle weakness (generalized) (M62.81)     Time: 1610-9604 PT Time Calculation (min) (ACUTE ONLY): 21 min  Charges:    $Therapeutic Exercise: 8-22 mins $Therapeutic Activity: 8-22 mins PT General Charges $$ ACUTE PT VISIT: 1 Visit                     1:59 PM, 03/29/24 Walton Guppy, MPT Physical Therapist with [facility] [phone] office [phone] mobile phone

## 2024-03-29 NOTE — TOC Progression Note (Addendum)
 Transition of Care [facility]) - Progression Note    Patient Details  Name: Sheryl Hall MRN: [id number] Date of Birth: 1961-06-07  Transition of Care [facility]) CM/SW Contact  [name], RN Phone Number: 03/29/2024, 3:25 PM  Clinical Narrative:   CM following up on email from [place] at [facility]. Aurora Blowers and myself navigated through the [facility] Provider website and made a list of accepting facilities within 100 miles. CM called or texted those facilities with no offers. All saying they are not accepting [facility]. CM went to the bedside, patient walked 15 feet today, improving daily, we discussed going home. Patient states she still does not feel ready. She has an Aide 2 hours a day but that is not enough. Her sister lives in the same apartment, but works 6 days a week and has to go and care for their mother, also a post CVA patient.  CM updated Aurora Blowers with [facility], asking for more aide hours. Also with patient's permission, CM called Tammy, with [facility] to discuss the case. Patient is willing to give up her SSI check to go somewhere to get better. Tammy will come assess patient tomorrow for ALF. TOC following.   Addendum:  CM left a message with Vallie Gay, [employer], asking if they have any [name] accepting SNF's?   Expected Discharge Plan: Skilled Nursing Facility Barriers to Discharge: SNF Pending bed offer  Expected Discharge Plan and Services In-house Referral: Clinical Social Work Discharge Planning Services: CM Consult Post Acute Care Choice: Skilled Nursing Facility Living arrangements for the past 2 months: Single Family Home                     Social Determinants of Health (SDOH) Interventions SDOH Screenings   Food Insecurity: No Food Insecurity (03/23/2024)  Housing: Low Risk  (03/23/2024)  Transportation Needs: Unmet Transportation Needs (03/23/2024)  Utilities: Not At Risk (03/23/2024)  Alcohol Screen: Low Risk  (07/18/2021)  Depression (PHQ2-9):  High Risk (10/20/2023)  Social Connections: Socially Isolated (03/23/2024)  Tobacco Use: Medium Risk (03/25/2024)    Readmission Risk Interventions     No data to display

## 2024-03-29 NOTE — Progress Notes (Addendum)
 Occupational Therapy Treatment Patient Details Name: Sheryl Hall MRN: [id number] DOB: [birth year] Today's Date: 03/29/2024   History of present illness Sheryl Hall is a 63 y.o. female with medical history significant for hypertension, dyslipidemia, prior CVA, GERD, hypothyroidism, bipolar disorder, anxiety/depression, and arthritis who presented to the ED for left-sided weakness and numbness and difficulty walking that began at approximately 8 AM when she woke up.  She normally uses wheelchair at home, but is able to transfer from wheelchair to couch and was not able to do so today.  She called her sister at 39 AM and she was told to call 911 and presented to the ED.  She was not able to move her left side arm or leg and had numbness upon arrival of EMS.  She had mild headache, but denied any speech changes or vision changes.  She was otherwise noted to be her usual self when she went to sleep on her couch last night.  She states that she is otherwise compliant with her home medications.  She follows with Dr. Godwin Lat at [facility] and last seizure was about 2 years ago   OT comments  Pt agreeable to OT evaluation. Pt able to transfer to the toilet with CGA at first but some buckling of knees and need for further assist when attempting to stand at the sink to wash hands. Single hand held assist without RW to transfer from w/c to chair. Pt able to don socks without assist seated at the EOB despite reports that care providers do it for her at home. Jerking movement at times when standing and ambulating. Working on strengthening seated in the chair with good A/ROM. Pt left in the chair with call bell within reach. Pt will benefit from continued OT in the hospital and recommended venue below to increase strength, balance, and endurance for safe ADL's.         If plan is discharge home, recommend the following:  A little help with bathing/dressing/bathroom;Assistance with cooking/housework;Assist for  transportation;Help with stairs or ramp for entrance;A little help with walking and/or transfers   Equipment Recommendations  BSC          Precautions / Restrictions Precautions Precautions: Fall Recall of Precautions/Restrictions: Intact Restrictions Weight Bearing Restrictions Per Provider Order: No       Mobility Bed Mobility Overal bed mobility: Modified Independent             General bed mobility comments: HOB elevated    Transfers Overall transfer level: Needs assistance   Transfers: Sit to/from Stand, Bed to chair/wheelchair/BSC Sit to Stand: Contact guard assist, Min assist Stand pivot transfers: Min assist         General transfer comment: w/c to chair with single hand held assist     Balance Overall balance assessment: Needs assistance Sitting-balance support: No upper extremity supported, Feet supported Sitting balance-Leahy Scale: Good Sitting balance - Comments: seated at EOB   Standing balance support: Bilateral upper extremity supported, During functional activity Standing balance-Leahy Scale: Poor Standing balance comment: poor without AD; poor to fair with AD                           ADL either performed or assessed with clinical judgement   ADL Overall ADL's : Needs assistance/impaired     Grooming: Moderate assistance;Standing;Maximal assistance Grooming Details (indicate cue type and reason): Attempted in standing but pt demonstrated buckling of legs and need to sit. Set  up assist if seated.             Lower Body Dressing: Set up;Sitting/lateral leans Lower Body Dressing Details (indicate cue type and reason): Able to don socks seated at EOB without physical assist. Toilet Transfer: Minimal assistance;Ambulation;Rolling walker (2 wheels) Toilet Transfer Details (indicate cue type and reason): Able to ambulate to toilet with CGA to min A using RW         Functional mobility during ADLs: Contact guard  assist;Minimal assistance;Rolling walker (2 wheels)      Extremity/Trunk Assessment Upper Extremity Assessment Upper Extremity Assessment: Generalized weakness   Lower Extremity Assessment Lower Extremity Assessment: Defer to PT evaluation                         Communication Communication Communication: No apparent difficulties   Cognition Arousal: Alert Behavior During Therapy: WFL for tasks assessed/performed Cognition: No apparent impairments                               Following commands: Intact        Cueing   Cueing Techniques: Verbal cues  Exercises Exercises: General Upper Extremity General Exercises - Upper Extremity Shoulder Flexion: AROM, Both, 10 reps, Seated Shoulder Horizontal ABduction: AROM, Both, 10 reps, Seated (x10 protraction as well.)                 Pertinent Vitals/ Pain       Pain Assessment Pain Assessment: Faces Pain Score: 0-No pain                                                          Frequency  Min 2X/week        Progress Toward Goals  OT Goals(current goals can now be found in the care plan section)  Progress towards OT goals: Progressing toward goals  Acute Rehab OT Goals Patient Stated Goal: improve function at rehab; walk OT Goal Formulation: With patient Time For Goal Achievement: 04/07/24 Potential to Achieve Goals: Good ADL Goals Pt Will Perform Grooming: with modified independence;standing Pt Will Perform Lower Body Bathing: with modified independence Pt Will Perform Upper Body Dressing: with modified independence Pt Will Perform Lower Body Dressing: with modified independence Pt Will Transfer to Toilet: with modified independence Pt Will Perform Toileting - Clothing Manipulation and hygiene: with modified independence Pt/caregiver will Perform Home Exercise Program: Increased strength;Both right and left upper extremity;Independently  Plan       Co-evaluation    PT/OT/SLP Co-Evaluation/Treatment: Yes Reason for Co-Treatment: To address functional/ADL transfers   OT goals addressed during session: ADL's and self-care                          End of Session Equipment Utilized During Treatment: Rolling walker (2 wheels)  OT Visit Diagnosis: Unsteadiness on feet (R26.81);Other abnormalities of gait and mobility (R26.89);Muscle weakness (generalized) (M62.81);Other symptoms and signs involving the nervous system (R29.898)   Activity Tolerance Patient tolerated treatment well   Patient Left in chair;with call bell/phone within reach   Nurse Communication          Time: 7829-5621 OT Time Calculation (min): 21 min  Charges: OT General Charges $OT Visit:  1 Visit (no additional time charge due to co-treat under 23 minutes.)  [facility] OT, MOT   Thurnell Floss 03/29/2024, 9:30 AM

## 2024-03-29 NOTE — Plan of Care (Signed)
  Problem: Education: Goal: Knowledge of General Education information will improve Description: Including pain rating scale, medication(s)/side effects and non-pharmacologic comfort measures Outcome: Progressing   Problem: Health Behavior/Discharge Planning: Goal: Ability to manage health-related needs will improve Outcome: Progressing   Problem: Clinical Measurements: Goal: Ability to maintain clinical measurements within normal limits will improve Outcome: Progressing Goal: Will remain free from infection Outcome: Progressing Goal: Diagnostic test results will improve Outcome: Progressing Goal: Cardiovascular complication will be avoided Outcome: Progressing   Problem: Activity: Goal: Risk for activity intolerance will decrease Outcome: Progressing   Problem: Nutrition: Goal: Adequate nutrition will be maintained Outcome: Progressing   Problem: Elimination: Goal: Will not experience complications related to bowel motility Outcome: Progressing Goal: Will not experience complications related to urinary retention Outcome: Progressing   Problem: Pain Managment: Goal: General experience of comfort will improve and/or be controlled Outcome: Progressing

## 2024-03-29 NOTE — Progress Notes (Signed)
 PROGRESS NOTE    Sheryl Hall  GEX:[id number] DOB: 1961/05/26 DOA: 03/23/2024 PCP: Austine Lefort, MD   Brief Narrative:    Sheryl Hall is a 63 y.o. female with medical history significant for hypertension, dyslipidemia, prior CVA, GERD, hypothyroidism, bipolar disorder, anxiety/depression, and arthritis who presented to the ED for left-sided weakness and numbness and difficulty walking that began at approximately 8 AM when she woke up.  She normally uses wheelchair at home, but is able to transfer from wheelchair to couch and was not able to do so today.  She was admitted for CVA evaluation and brain MRI negative.  She has been seen by PT who is now recommending SNF placement, but unfortunately she cannot be placed given her issues with insurance.  Patient is medically stable for discharge.  Assessment & Plan:   Principal Problem:   CVA (cerebral vascular accident) (HCC)  Assessment and Plan:  Left-sided hemiparesis concern for TIA versus functional neurological disorder - Appreciate neurology recommendations, brain MRI with no acute findings. - 2D echocardiogram as noted below with preserved LVEF - Continue aspirin  81 mg daily for secondary prevention. - Continue the use of Lipitor; LDL 136. -A1c 5.8%, continue monitor outpatient; modified carbohydrate diet discussed with patient. -PT recommending SNF placement, she cannot be placed given insurance issues and will need ongoing PT evaluation. -TOC aware and helping with discharge plans.   Seizure disorder - Continue home Keppra  -No seizure activity appreciated.   Bipolar disorder/anxiety/depression - Continue home medications -No suicidal ideation or hallucination.   Hypothyroidism - Continue levothyroxine    GERD - Continue the use of PPI - Lifestyle changes discussed with patient.     History of CVA/dyslipidemia - Continue statin and aspirin  for secondary prevention  Overactive bladder -continue  oxybutynin  Constipation - Bowel movement achieved with bisacodyl  suppository - Continue to maintain adequate hydration - Patient advised to increase fiber intake.    DVT prophylaxis: SCDs Code Status: Full Family Communication: Sister updated over the phone. Disposition Plan:  Status is: Observation The patient will require care spanning > 2 midnights and should be moved to inpatient because: Need for inpatient SNF placement.  Consultants:  Neurology  Procedures:  None  Antimicrobials:  None   Subjective: Patient seen and evaluated and denies any specific complaints or concerns this morning.  She has worked with PT/OT and continues to have some weakness and balance issues.  Objective: Vitals:   03/28/24 1352 03/28/24 2001 03/28/24 2359 03/29/24 0317  BP: 131/64 (!) 117/57 (!) 130/50 (!) 122/55  Pulse: 76 66 64 64  Resp: 18 18 16 16   Temp: 97.8 F (36.6 C) 98.2 F (36.8 C) 97.8 F (36.6 C) 98 F (36.7 C)  TempSrc:  Oral Oral Oral  SpO2: 97% 95% 94% 92%  Weight:      Height:        Intake/Output Summary (Last 24 hours) at 03/29/2024 1219 Last data filed at 03/28/2024 1700 Gross per 24 hour  Intake 540 ml  Output --  Net 540 ml    Filed Weights   03/23/24 1215  Weight: 83.9 kg    Examination: General exam: Alert, awake, following commands appropriately and in no acute distress; reporting feeling weak and tired. Respiratory system: Clear to auscultation. Respiratory effort normal.  Good saturation on room air. Cardiovascular system:RRR. No murmurs, rubs, gallops. Gastrointestinal system: Abdomen is nondistended, soft and nontender. No organomegaly or masses felt. Normal bowel sounds heard. Central nervous system: Moving 4 limbs spontaneously;  no focal neurological deficits.  Generally weak. Extremities: No cyanosis or clubbing. Skin: No petechiae. Psychiatry: Judgement and insight appear normal.  Flat affect appreciated on exam.    Data Reviewed: I have  personally reviewed following labs and imaging studies  CBC: Recent Labs  Lab 03/23/24 1113  WBC 7.3  NEUTROABS 6.2  HGB 12.3  HCT 41.9  MCV 77.0*  PLT 209   Basic Metabolic Panel: Recent Labs  Lab 03/23/24 1113  NA 139  K 3.8  CL 103  CO2 25  GLUCOSE 115*  BUN 7*  CREATININE 1.01*  CALCIUM  9.1   GFR: Estimated Creatinine Clearance: 63.8 mL/min (A) (by C-G formula based on SCr of 1.01 mg/dL (H)).  Liver Function Tests: Recent Labs  Lab 03/23/24 1113  AST 17  ALT 11  ALKPHOS 109  BILITOT 0.9  PROT 6.8  ALBUMIN 3.8   Radiology Studies: No results found.  Scheduled Meds:  ARIPiprazole   2 mg Oral QHS   aspirin  EC  81 mg Oral Daily   atorvastatin   40 mg Oral Daily   bisacodyl   10 mg Rectal Once   escitalopram   10 mg Oral Daily   lamoTRIgine   25 mg Oral BID   levETIRAcetam   1,000 mg Oral BID   levothyroxine   50 mcg Oral Q0600   melatonin  3 mg Oral QHS   nystatin    Topical BID   oxybutynin  2.5 mg Oral TID   pantoprazole   40 mg Oral Daily   polyethylene glycol  17 g Oral Daily   pregabalin   25 mg Oral BID     LOS: 0 days    Time spent: 35 minutes  Devonte Migues Loran Rock, DO [facility]  If 7PM-7AM, please contact night-coverage [email or web address] 03/29/2024, 12:19 PM

## 2024-03-30 DIAGNOSIS — R531 Weakness: Secondary | ICD-10-CM | POA: Diagnosis present

## 2024-03-30 DIAGNOSIS — R29818 Other symptoms and signs involving the nervous system: Secondary | ICD-10-CM | POA: Diagnosis present

## 2024-03-30 DIAGNOSIS — Z5971 Insufficient health insurance coverage: Secondary | ICD-10-CM | POA: Diagnosis not present

## 2024-03-30 DIAGNOSIS — R29898 Other symptoms and signs involving the musculoskeletal system: Secondary | ICD-10-CM

## 2024-03-30 DIAGNOSIS — E785 Hyperlipidemia, unspecified: Secondary | ICD-10-CM | POA: Diagnosis present

## 2024-03-30 DIAGNOSIS — G40909 Epilepsy, unspecified, not intractable, without status epilepticus: Secondary | ICD-10-CM | POA: Diagnosis present

## 2024-03-30 DIAGNOSIS — G8194 Hemiplegia, unspecified affecting left nondominant side: Secondary | ICD-10-CM | POA: Diagnosis present

## 2024-03-30 DIAGNOSIS — Z823 Family history of stroke: Secondary | ICD-10-CM | POA: Diagnosis not present

## 2024-03-30 DIAGNOSIS — Z85828 Personal history of other malignant neoplasm of skin: Secondary | ICD-10-CM | POA: Diagnosis not present

## 2024-03-30 DIAGNOSIS — Z7982 Long term (current) use of aspirin: Secondary | ICD-10-CM | POA: Diagnosis not present

## 2024-03-30 DIAGNOSIS — Z79890 Hormone replacement therapy (postmenopausal): Secondary | ICD-10-CM | POA: Diagnosis not present

## 2024-03-30 DIAGNOSIS — F319 Bipolar disorder, unspecified: Secondary | ICD-10-CM | POA: Diagnosis present

## 2024-03-30 DIAGNOSIS — K219 Gastro-esophageal reflux disease without esophagitis: Secondary | ICD-10-CM | POA: Diagnosis present

## 2024-03-30 DIAGNOSIS — E039 Hypothyroidism, unspecified: Secondary | ICD-10-CM | POA: Diagnosis present

## 2024-03-30 DIAGNOSIS — G459 Transient cerebral ischemic attack, unspecified: Secondary | ICD-10-CM | POA: Diagnosis present

## 2024-03-30 DIAGNOSIS — Z833 Family history of diabetes mellitus: Secondary | ICD-10-CM | POA: Diagnosis not present

## 2024-03-30 DIAGNOSIS — F4310 Post-traumatic stress disorder, unspecified: Secondary | ICD-10-CM | POA: Diagnosis present

## 2024-03-30 DIAGNOSIS — F419 Anxiety disorder, unspecified: Secondary | ICD-10-CM | POA: Diagnosis present

## 2024-03-30 DIAGNOSIS — I10 Essential (primary) hypertension: Secondary | ICD-10-CM | POA: Diagnosis present

## 2024-03-30 DIAGNOSIS — G4700 Insomnia, unspecified: Secondary | ICD-10-CM | POA: Diagnosis present

## 2024-03-30 DIAGNOSIS — K5900 Constipation, unspecified: Secondary | ICD-10-CM | POA: Diagnosis not present

## 2024-03-30 DIAGNOSIS — E0590 Thyrotoxicosis, unspecified without thyrotoxic crisis or storm: Secondary | ICD-10-CM | POA: Diagnosis present

## 2024-03-30 DIAGNOSIS — Z96643 Presence of artificial hip joint, bilateral: Secondary | ICD-10-CM | POA: Diagnosis present

## 2024-03-30 DIAGNOSIS — Z8249 Family history of ischemic heart disease and other diseases of the circulatory system: Secondary | ICD-10-CM | POA: Diagnosis not present

## 2024-03-30 DIAGNOSIS — N3281 Overactive bladder: Secondary | ICD-10-CM | POA: Diagnosis not present

## 2024-03-30 DIAGNOSIS — Z993 Dependence on wheelchair: Secondary | ICD-10-CM | POA: Diagnosis not present

## 2024-03-30 NOTE — Progress Notes (Signed)
 Physical Therapy Treatment Patient Details Name: Sheryl Hall MRN: [id number] DOB: [birth year] Today's Date: 03/30/2024   History of Present Illness Sheryl Hall is a 63 y.o. female with medical history significant for hypertension, dyslipidemia, prior CVA, GERD, hypothyroidism, bipolar disorder, anxiety/depression, and arthritis who presented to the ED for left-sided weakness and numbness and difficulty walking that began at approximately 8 AM when she woke up.  She normally uses wheelchair at home, but is able to transfer from wheelchair to couch and was not able to do so today.  She called her sister at 37 AM and she was told to call 911 and presented to the ED.  She was not able to move her left side arm or leg and had numbness upon arrival of EMS.  She had mild headache, but denied any speech changes or vision changes.  She was otherwise noted to be her usual self when she went to sleep on her couch last night.  She states that she is otherwise compliant with her home medications.  She follows with [facility] Godwin [facility] at [facility] and last seizure was about 2 years ago    PT Comments  Pt tolerated today's treatment session, well with increasing motivation for activity. Today's session addressed reducing UE assist during sit/stand transfers and increasing ambulation distance. Pt noted with with improved postural reactions with verbal cues for sit/stand transfer. Able to tolerate 8x without UE support from EOB. Pt ambulating 56ft x5 with 1 min rest between ambulation bouts. Pt thinks her Left foot is dragging more but no clinical signs during ambulation were observed. Pt would continue to benefit from skilled acute physical therapy services in order to progress toward POC goals, safety/independence with functional mobility and QOL.     If plan is discharge home, recommend the following: A lot of help with bathing/dressing/bathroom;A lot of help with walking and/or transfers;Help with stairs or ramp for  entrance;Assistance with cooking/housework   Can travel by private vehicle     No  Equipment Recommendations  None recommended by PT    Recommendations for Other Services       Precautions / Restrictions Precautions Precautions: Fall Recall of Precautions/Restrictions: Intact Restrictions Weight Bearing Restrictions Per Provider Order: No     Mobility  Bed Mobility Overal bed mobility: Modified Independent Bed Mobility: Supine to Sit     Supine to sit: Modified independent (Device/Increase time)     General bed mobility comments: HOB elevated, slow labored Patient Response: Cooperative  Transfers Overall transfer level: Needs assistance Equipment used: Rolling walker (2 wheels) Transfers: Sit to/from Stand Sit to Stand: Contact guard assist, Supervision           General transfer comment: Practiced multiple sit/stands throughout session with cues for no UE support and practicing anterior trunk lean for postural reaction for sit/stand, does better with constant verbal cues. 8x sit/stands without UE support.    Ambulation/Gait Ambulation/Gait assistance: Contact guard assist Gait Distance (Feet): 60 Feet Assistive device: Rolling walker (2 wheels) Gait Pattern/deviations: Decreased step length - right, Decreased step length - left, Decreased stride length, Ataxic       General Gait Details: 25ft bout for 5x with 1 min sitting rest break. Verbal cues for increased LLE step length during swing phase with increased hip flexion moment from patient. No LOB during straight aways but mild LOB laterally during turning into chair x 1. CGA to maintain upright  support.   Stairs  Wheelchair Mobility     Tilt Bed Tilt Bed Patient Response: Cooperative  Modified Rankin (Stroke Patients Only)       Balance Overall balance assessment: Needs assistance Sitting-balance support: No upper extremity supported, Feet supported Sitting balance-Leahy  Scale: Good Sitting balance - Comments: seated at EOB   Standing balance support: During functional activity, Reliant on assistive device for balance, Bilateral upper extremity supported Standing balance-Leahy Scale: Fair Standing balance comment: fair<>good with RW                            Communication Communication Communication: No apparent difficulties  Cognition Arousal: Alert Behavior During Therapy: WFL for tasks assessed/performed   PT - Cognitive impairments: No apparent impairments                                Cueing Cueing Techniques: Verbal cues, Tactile cues  Exercises General Exercises - Lower Extremity Long Arc Quad: Seated, AROM, Strengthening, Both, 20 reps Toe Raises: AROM, Both, 20 reps, Seated Heel Raises: AROM, Both, 20 reps, Seated Other Exercises Other Exercises: Full sit/stands without UE support x 8 with cues for anterior trunk lean. Other Exercises: 79ft marked from EOB to recliner-sit/stand transfer without UE support and then ambulating back<>forth.    General Comments        Pertinent Vitals/Pain Pain Assessment Pain Assessment: No/denies pain    Home Living                          Prior Function            PT Goals (current goals can now be found in the care plan section) Acute Rehab PT Goals Patient Stated Goal: return home after rehab PT Goal Formulation: With patient Time For Goal Achievement: 04/07/24 Potential to Achieve Goals: Good    Frequency    Min 3X/week      PT Plan      Co-evaluation              AM-PAC PT "6 Clicks" Mobility   Outcome Measure  Help needed turning from your back to your side while in a flat bed without using bedrails?: None Help needed moving from lying on your back to sitting on the side of a flat bed without using bedrails?: None Help needed moving to and from a bed to a chair (including a wheelchair)?: A Little Help needed standing up from a  chair using your arms (e.g., wheelchair or bedside chair)?: A Little Help needed to walk in hospital room?: A Little Help needed climbing 3-5 steps with a railing? : Total 6 Click Score: 18    End of Session   Activity Tolerance: Patient tolerated treatment well;Patient limited by fatigue Patient left: in chair;with call bell/phone within reach;with chair alarm set;with nursing/sitter in room Nurse Communication: Mobility status PT Visit Diagnosis: Unsteadiness on feet (R26.81);Other abnormalities of gait and mobility (R26.89);Muscle weakness (generalized) (M62.81)     Time: 0981-1914 PT Time Calculation (min) (ACUTE ONLY): 24 min  Charges:    $Therapeutic Activity: 23-37 mins PT General Charges $$ ACUTE PT VISIT: 1 Visit                     Astrid Lay, DPT [facility] [place] 336 [phone] office   Gatha Kaska 03/30/2024, 3:10 PM

## 2024-03-30 NOTE — TOC Progression Note (Addendum)
 Transition of Care [facility]) - Progression Note    Patient Details  Name: Sheryl Hall MRN: [id number] Date of Birth: June 19, 1961  Transition of Care [facility]) CM/SW Contact  [name], RN Phone Number: 03/30/2024, 3:04 PM  Clinical Narrative:   [facility] did not accept patient. Per Francoise Ishihara patient need assistance to stand and stated she was dizzy and returned to bed.  PT went to work with patient. She walked 60 feet. CM explained to patient she will need to work hard if someone comes to assess for ALF.  TOC called [facility], they can not accept. CM called [name] again and asked for more aide hours at home.  CM heard back from the [facility] navigator, they will not make offers on [name]. Team updated.    AddendumAurora Blowers with [facility] called back, she is working on [facility] hours. She provided another SNF's in network. CM faxed referral to [facility] in [place], [place].  CM disucss transport if we get an accepting facility greater than 50 miles away. [facility] has a tailored transportation plan we can call once we secure placement.    Expected Discharge Plan: Skilled Nursing Facility Barriers to Discharge: SNF Pending bed offer  Expected Discharge Plan and Services In-house Referral: Clinical Social Work Discharge Planning Services: CM Consult Post Acute Care Choice: Skilled Nursing Facility Living arrangements for the past 2 months: Single Family Home                    Social Determinants of Health (SDOH) Interventions SDOH Screenings   Food Insecurity: No Food Insecurity (03/23/2024)  Housing: Low Risk  (03/23/2024)  Transportation Needs: Unmet Transportation Needs (03/23/2024)  Utilities: Not At Risk (03/23/2024)  Alcohol Screen: Low Risk  (07/18/2021)  Depression (PHQ2-9): High Risk (10/20/2023)  Social Connections: Socially Isolated (03/23/2024)  Tobacco Use: Medium Risk (03/25/2024)    Readmission Risk Interventions     No data to display

## 2024-03-30 NOTE — Hospital Course (Signed)
 63 y.o. female with medical history significant for hypertension, dyslipidemia, prior CVA, GERD, hypothyroidism, bipolar disorder, anxiety/depression, and arthritis who presented to the ED for left-sided weakness and numbness and difficulty walking that began at approximately 8 AM when she woke up.  She normally uses wheelchair at home, but is able to transfer from wheelchair to couch and was not able to do so today.  She was admitted for CVA evaluation and brain MRI negative.  She has been seen by PT who is now recommending SNF placement, but unfortunately she cannot be placed given her issues with insurance.  Patient is medically stable for discharge.

## 2024-03-30 NOTE — Progress Notes (Signed)
 PROGRESS NOTE   TAWNNI BESTER  JWJ:[id number] DOB: [birth year] DOA: 03/23/2024 PCP: Austine Lefort, MD   Chief Complaint  Patient presents with   Extremity Weakness   Code Stroke   Level of care: Med-Surg  Brief Admission History:  63 y.o. female with medical history significant for hypertension, dyslipidemia, prior CVA, GERD, hypothyroidism, bipolar disorder, anxiety/depression, and arthritis who presented to the ED for left-sided weakness and numbness and difficulty walking that began at approximately 8 AM when she woke up.  She normally uses wheelchair at home, but is able to transfer from wheelchair to couch and was not able to do so today.  She was admitted for CVA evaluation and brain MRI negative.  She has been seen by PT who is now recommending SNF placement, but unfortunately she cannot be placed given her issues with insurance.  Patient is medically stable for discharge.    Assessment and Plan:  Left-sided hemiparesis concern for TIA versus functional neurological disorder - Appreciate neurology recommendations, brain MRI with no acute findings. - 2D echocardiogram as noted below with preserved LVEF - Continue aspirin  81 mg daily for secondary prevention. - Continue the use of Lipitor; LDL 136. - A1c 5.8%, continue monitor outpatient; modified carbohydrate diet discussed with patient. - PT recommending SNF placement, she cannot be placed given insurance issues and will need ongoing PT evaluation. - TOC aware and helping with discharge plans.   Seizure disorder - Continue home Keppra  - No seizure activity appreciated.   Bipolar disorder/anxiety/depression - Continue home medications - currently has been stable  - No suicidal ideation or hallucination.   Hypothyroidism - Continue levothyroxine    GERD - Continue the use of PPI - Lifestyle changes discussed with patient.     History of CVA/dyslipidemia - Continue statin and aspirin  for secondary prevention    Overactive bladder -continue oxybutynin   Constipation - Bowel movement achieved with bisacodyl  suppository - Continue to maintain adequate hydration - Patient advised to increase fiber intake.   DVT prophylaxis: SCDs Code Status: Full  Family Communication:  Disposition: TBD    Consultants:  Neurology   Procedures:   Antimicrobials:    Subjective: Pt says stiffness and soreness in legs noted, not really getting out of bed that much.   Objective: Vitals:   03/29/24 2354 03/30/24 0443 03/30/24 0803 03/30/24 1203  BP: (!) 113/46 (!) 110/47 (!) 118/54 (!) 104/55  Pulse: (!) 51 64 79 72  Resp: 16 18 20 16   Temp: 98 F (36.7 C) 98 F (36.7 C) 98.2 F (36.8 C) 98.4 F (36.9 C)  TempSrc: Oral Oral Oral Oral  SpO2: 95% 98% 91% 96%  Weight:      Height:        Intake/Output Summary (Last 24 hours) at 03/30/2024 1210 Last data filed at 03/30/2024 0500 Gross per 24 hour  Intake 480 ml  Output --  Net 480 ml   Filed Weights   03/23/24 1215  Weight: 83.9 kg   Examination:  General exam: Appears calm and comfortable  Respiratory system: Clear to auscultation. Respiratory effort normal. Cardiovascular system: normal S1 & S2 heard. No JVD, murmurs, rubs, gallops or clicks. No pedal edema. Gastrointestinal system: Abdomen is nondistended, soft and nontender. No organomegaly or masses felt. Normal bowel sounds heard. Central nervous system: Alert and oriented. No focal neurological deficits. Extremities: Symmetric 5 x 5 power. Skin: No rashes, lesions or ulcers. Psychiatry: Judgement and insight appear normal. Mood & affect appropriate.   Data Reviewed:  I have personally reviewed following labs and imaging studies  CBC: No results for input(s): "WBC", "NEUTROABS", "HGB", "HCT", "MCV", "PLT" in the last 168 hours.  Basic Metabolic Panel: No results for input(s): "NA", "K", "CL", "CO2", "GLUCOSE", "BUN", "CREATININE", "CALCIUM ", "MG", "PHOS" in the last 168  hours.  CBG: No results for input(s): "GLUCAP" in the last 168 hours.  No results found for this or any previous visit (from the past 240 hours).   Radiology Studies: No results found.  Scheduled Meds:  ARIPiprazole   2 mg Oral QHS   aspirin  EC  81 mg Oral Daily   atorvastatin   40 mg Oral Daily   bisacodyl   10 mg Rectal Once   escitalopram   10 mg Oral Daily   lamoTRIgine   25 mg Oral BID   levETIRAcetam   1,000 mg Oral BID   levothyroxine   50 mcg Oral Q0600   melatonin  3 mg Oral QHS   nystatin    Topical BID   oxybutynin  2.5 mg Oral TID   pantoprazole   40 mg Oral Daily   polyethylene glycol  17 g Oral Daily   pregabalin   25 mg Oral BID   Continuous Infusions:   LOS: 0 days   Time spent: 40 mins  Lateia Fraser Lincoln Renshaw, MD How to contact the [facility] Attending or Consulting provider 7A - 7P or covering provider during after hours 7P -7A, for this patient?  Check the care team in [facility] and look for a) attending/consulting TRH provider listed and b) the TRH team listed Log into [email or web address] to find provider on call.  Locate the TRH provider you are looking for under [facility] and page to a number that you can be directly reached. If you still have difficulty reaching the provider, please page the [facility] (Director on Call) for the Hospitalists listed on amion for assistance.  03/30/2024, 12:10 PM

## 2024-03-30 NOTE — Plan of Care (Signed)
  Problem: Education: Goal: Knowledge of General Education information will improve Description: Including pain rating scale, medication(s)/side effects and non-pharmacologic comfort measures Outcome: Progressing   Problem: Clinical Measurements: Goal: Ability to maintain clinical measurements within normal limits will improve Outcome: Progressing Goal: Diagnostic test results will improve Outcome: Progressing   Problem: Activity: Goal: Risk for activity intolerance will decrease Outcome: Progressing   Problem: Nutrition: Goal: Adequate nutrition will be maintained Outcome: Progressing   Problem: Coping: Goal: Level of anxiety will decrease Outcome: Progressing   Problem: Safety: Goal: Ability to remain free from injury will improve Outcome: Progressing   Problem: Education: Goal: Knowledge of disease or condition will improve Outcome: Progressing   Problem: Ischemic Stroke/TIA Tissue Perfusion: Goal: Complications of ischemic stroke/TIA will be minimized Outcome: Progressing   Problem: Coping: Goal: Will verbalize positive feelings about self Outcome: Progressing   Problem: Self-Care: Goal: Ability to participate in self-care as condition permits will improve Outcome: Progressing Goal: Verbalization of feelings and concerns over difficulty with self-care will improve Outcome: Progressing Goal: Ability to communicate needs accurately will improve Outcome: Progressing

## 2024-03-30 NOTE — Plan of Care (Signed)
  Problem: Education: Goal: Knowledge of General Education information will improve Description: Including pain rating scale, medication(s)/side effects and non-pharmacologic comfort measures Outcome: Progressing   Problem: Clinical Measurements: Goal: Ability to maintain clinical measurements within normal limits will improve Outcome: Progressing Goal: Diagnostic test results will improve Outcome: Progressing   Problem: Activity: Goal: Risk for activity intolerance will decrease Outcome: Progressing   Problem: Nutrition: Goal: Adequate nutrition will be maintained Outcome: Progressing   Problem: Coping: Goal: Level of anxiety will decrease Outcome: Progressing   Problem: Safety: Goal: Ability to remain free from injury will improve Outcome: Progressing

## 2024-03-31 ENCOUNTER — Telehealth: Payer: Self-pay

## 2024-03-31 DIAGNOSIS — R531 Weakness: Secondary | ICD-10-CM | POA: Diagnosis not present

## 2024-03-31 MED ORDER — ATORVASTATIN CALCIUM 40 MG PO TABS: 40.0000 mg | ORAL_TABLET | Freq: Every day | ORAL | 1 refills | Status: DC

## 2024-03-31 MED ORDER — ASPIRIN 81 MG PO TBEC: 81.0000 mg | DELAYED_RELEASE_TABLET | Freq: Every day | ORAL | Status: AC

## 2024-03-31 MED ORDER — MELATONIN 3 MG PO TABS: 3.0000 mg | ORAL_TABLET | Freq: Every day | ORAL | Status: AC

## 2024-03-31 MED ORDER — POLYETHYLENE GLYCOL 3350 17 G PO PACK: 17.0000 g | PACK | Freq: Every day | ORAL | 0 refills | Status: AC | PRN

## 2024-03-31 MED ORDER — OXYBUTYNIN CHLORIDE 2.5 MG PO TABS: 2.5000 mg | ORAL_TABLET | Freq: Three times a day (TID) | ORAL | 0 refills | Status: DC | PRN

## 2024-03-31 NOTE — Discharge Summary (Addendum)
 Physician Discharge Summary  GWENDLYON BENSTON ZOX:[id number] DOB: 1961/01/14 DOA: 03/23/2024  PCP: Austine Lefort, MD Neurologist: Dr. Godwin Lat   Admit date: 03/23/2024 Discharge date: 03/31/2024  Admitted From:  Home  Disposition:  Home   Recommendations for Outpatient Follow-up:  Follow up with PCP in 1 weeks Follow up with neurologist Dr. Godwin Lat in 2 weeks   Home Health:  Aide   Discharge Condition: STABLE   CODE STATUS: FULL DIET: soft foods, advance as tolerated    Brief Hospitalization Summary: Please see all hospital notes, images, labs for full details of the hospitalization. Admission provider HPI:  63 y.o. female with medical history significant for hypertension, dyslipidemia, prior CVA, GERD, hypothyroidism, bipolar disorder, anxiety/depression, and arthritis who presented to the ED for left-sided weakness and numbness and difficulty walking that began at approximately 8 AM when she woke up.  She normally uses wheelchair at home, but is able to transfer from wheelchair to couch and was not able to do so today.  She was admitted for CVA evaluation and brain MRI negative.  She has been seen by PT who is now recommending SNF placement, but unfortunately she cannot be placed given her issues with insurance.  Patient is medically stable for discharge.   Hospital course by listed problems addressed   Left-sided hemiparesis concern for TIA versus functional neurological disorder CVA has been RULED OUT - Appreciate neurology recommendations, brain MRI with no acute findings. - 2D echocardiogram as noted below with preserved LVEF - Continue aspirin  81 mg daily for secondary prevention. - Continue the use of Lipitor; LDL 136. - A1c 5.8%, continue monitor outpatient; modified carbohydrate diet discussed with patient. - PT recommending SNF placement, she cannot be placed given insurance issues and will need ongoing PT evaluation. TOC was able to arrange increased assistance from Aide at home  but not able to arrange any form of SNF rehab or HH due to none of them accepting patient's insurance.  - please see TOC notes regarding disposition.  I was told only option is for patient to go home with the increased assistance from personal care aides arranged by her insurance company.    Seizure disorder - Continue home Keppra  - No seizure activity appreciated.   Bipolar disorder/anxiety/depression - Continue home medications - currently has been stable  - No suicidal ideation or hallucination.   Hypothyroidism - Continue levothyroxine    GERD - Continue the use of PPI - Lifestyle changes discussed with patient.     History of CVA/dyslipidemia - Continue statin and aspirin  for secondary prevention   Overactive bladder -continue oxybutynin   Constipation - Bowel movement achieved with bisacodyl  suppository - Continue to maintain adequate hydration - Patient advised to increase fiber intake and miralax  as ordered.    Discharge Diagnoses:  Principal Problem:   Leg weakness Active Problems:   Depression   Bipolar disorder (HCC)   Hyperthyroidism   Hyperlipidemia   Constipation   Weakness generalized   Discharge Instructions:  Allergies as of 03/31/2024       Reactions   Lamictal  [lamotrigine ] Rash        Medication List     TAKE these medications    ARIPiprazole  2 MG tablet Commonly known as: Abilify  Take 1 tablet (2 mg total) by mouth at bedtime.   aspirin  EC 81 MG tablet Take 1 tablet (81 mg total) by mouth daily. Swallow whole. Start taking on: Apr 01, 2024   atorvastatin  40 MG tablet Commonly known as: LIPITOR Take  1 tablet (40 mg total) by mouth daily. Start taking on: Apr 01, 2024   escitalopram  10 MG tablet Commonly known as: Lexapro  Take 1 tablet (10 mg total) by mouth daily.   lamoTRIgine  25 MG tablet Commonly known as: LaMICtal  Take 1 tablet (25 mg total) by mouth 2 (two) times daily.   levETIRAcetam  1000 MG tablet Commonly known as:  KEPPRA  Take 1 tablet (1,000 mg total) by mouth 2 (two) times daily.   levothyroxine  50 MCG tablet Commonly known as: SYNTHROID  TAKE ONE TABLET BY MOUTH EVERY DAY.   melatonin 3 MG Tabs tablet Take 1 tablet (3 mg total) by mouth at bedtime.   omeprazole  40 MG capsule Commonly known as: PRILOSEC Take 1 capsule (40 mg total) by mouth in the morning and at bedtime.   oxyBUTYnin Chloride 2.5 MG Tabs Take 2.5 mg by mouth every 8 (eight) hours as needed for bladder spasms.   polyethylene glycol 17 g packet Commonly known as: MIRALAX  / GLYCOLAX  Take 17 g by mouth daily as needed for mild constipation.   pregabalin  25 MG capsule Commonly known as: LYRICA  Take 1 capsule (25 mg total) by mouth 2 (two) times daily.               Durable Medical Equipment  (From admission, onward)           Start     Ordered   03/29/24 0945  For home use only DME 3 n 1  Once        03/29/24 1610            Follow-up Information     Austine Lefort, MD. Schedule an appointment as soon as possible for a visit in 1 week(s).   Specialty: Family Medicine Why: Hospital Follow Up Contact information: [address] [place] [address] [phone]         Jorie Newness, MD. Schedule an appointment as soon as possible for a visit in 2 week(s).   Specialty: Neurology Why: Hospital Follow Up Contact information: [address] [place] [address] [phone]                Allergies  Allergen Reactions   Lamictal  [Lamotrigine ] Rash   Allergies as of 03/31/2024       Reactions   Lamictal  [lamotrigine ] Rash        Medication List     TAKE these medications    ARIPiprazole  2 MG tablet Commonly known as: Abilify  Take 1 tablet (2 mg total) by mouth at bedtime.   aspirin  EC 81 MG tablet Take 1 tablet (81 mg total) by mouth daily. Swallow whole. Start taking on: Apr 01, 2024   atorvastatin  40 MG tablet Commonly known as: LIPITOR Take 1 tablet  (40 mg total) by mouth daily. Start taking on: Apr 01, 2024   escitalopram  10 MG tablet Commonly known as: Lexapro  Take 1 tablet (10 mg total) by mouth daily.   lamoTRIgine  25 MG tablet Commonly known as: LaMICtal  Take 1 tablet (25 mg total) by mouth 2 (two) times daily.   levETIRAcetam  1000 MG tablet Commonly known as: KEPPRA  Take 1 tablet (1,000 mg total) by mouth 2 (two) times daily.   levothyroxine  50 MCG tablet Commonly known as: SYNTHROID  TAKE ONE TABLET BY MOUTH EVERY DAY.   melatonin 3 MG Tabs tablet Take 1 tablet (3 mg total) by mouth at bedtime.   omeprazole  40 MG capsule Commonly known as: PRILOSEC Take 1 capsule (  40 mg total) by mouth in the morning and at bedtime.   oxyBUTYnin Chloride 2.5 MG Tabs Take 2.5 mg by mouth every 8 (eight) hours as needed for bladder spasms.   polyethylene glycol 17 g packet Commonly known as: MIRALAX  / GLYCOLAX  Take 17 g by mouth daily as needed for mild constipation.   pregabalin  25 MG capsule Commonly known as: LYRICA  Take 1 capsule (25 mg total) by mouth 2 (two) times daily.               Durable Medical Equipment  (From admission, onward)           Start     Ordered   03/29/24 0945  For home use only DME 3 n 1  Once        03/29/24 0944            Procedures/Studies: ECHOCARDIOGRAM COMPLETE Result Date: 03/24/2024    ECHOCARDIOGRAM REPORT   Patient Name:   JAILAH CUPIT Date of Exam: 03/24/2024 Medical Rec #:  [id number]       Height:       66.0 in Accession #:    [id number]      Weight:       185.0 lb Date of Birth:  1961-04-25        BSA:          1.935 m Patient Age:    62 years        BP:           144/89 mmHg Patient Gender: F               HR:           88 bpm. Exam Location:  [facility] Procedure: 2D Echo, Cardiac Doppler, Color Doppler and Saline Contrast Bubble            Study (Both Spectral and Color Flow Doppler were utilized during            procedure). Indications:    Stroke l63.9  History:         Patient has prior history of Echocardiogram examinations, most                 recent 01/14/2013. Risk Factors:Dyslipidemia. Hx of drug abuse                 and ischemic stroke.  Sonographer:    Denese Finn RCS Referring Phys: [id number] PRATIK D [facility] IMPRESSIONS  1. Left ventricular ejection fraction, by estimation, is 60 to 65%. The left ventricle has normal function. The left ventricle has no regional wall motion abnormalities. Left ventricular diastolic parameters are consistent with Grade I diastolic dysfunction (impaired relaxation).  2. Right ventricular systolic function is normal. The right ventricular size is normal.  3. The mitral valve is normal in structure. Trivial mitral valve regurgitation. No evidence of mitral stenosis.  4. The aortic valve is tricuspid. Aortic valve regurgitation is not visualized. No aortic stenosis is present.  5. The inferior vena cava is normal in size with greater than 50% respiratory variability, suggesting right atrial pressure of 3 mmHg.  6. Agitated saline contrast bubble study was negative, with no evidence of any interatrial shunt. FINDINGS  Left Ventricle: Left ventricular ejection fraction, by estimation, is 60 to 65%. The left ventricle has normal function. The left ventricle has no regional wall motion abnormalities. The left ventricular internal cavity size was normal in size. There is  no left ventricular  hypertrophy. Left ventricular diastolic parameters are consistent with Grade I diastolic dysfunction (impaired relaxation). Right Ventricle: The right ventricular size is normal. Right vetricular wall thickness was not well visualized. Right ventricular systolic function is normal. Left Atrium: Left atrial size was normal in size. Right Atrium: Right atrial size was normal in size. Pericardium: There is no evidence of pericardial effusion. Mitral Valve: The mitral valve is normal in structure. Trivial mitral valve regurgitation. No evidence of mitral valve  stenosis. Tricuspid Valve: The tricuspid valve is normal in structure. Tricuspid valve regurgitation is not demonstrated. No evidence of tricuspid stenosis. Aortic Valve: The aortic valve is tricuspid. Aortic valve regurgitation is not visualized. No aortic stenosis is present. Aortic valve mean gradient measures 4.9 mmHg. Aortic valve peak gradient measures 9.2 mmHg. Aortic valve area, by VTI measures 2.32 cm. Pulmonic Valve: The pulmonic valve was not well visualized. Pulmonic valve regurgitation is not visualized. No evidence of pulmonic stenosis. Aorta: The aortic root is normal in size and structure. Venous: The inferior vena cava is normal in size with greater than 50% respiratory variability, suggesting right atrial pressure of 3 mmHg. IAS/Shunts: No atrial level shunt detected by color flow Doppler. Agitated saline contrast was given intravenously to evaluate for intracardiac shunting. Agitated saline contrast bubble study was negative, with no evidence of any interatrial shunt.  LEFT VENTRICLE PLAX 2D LVIDd:         4.20 cm   Diastology LVIDs:         2.80 cm   LV e' medial:    7.51 cm/s LV PW:         1.10 cm   LV E/e' medial:  11.5 LV IVS:        1.00 cm   LV e' lateral:   9.14 cm/s LVOT diam:     1.90 cm   LV E/e' lateral: 9.4 LV SV:         73 LV SV Index:   38 LVOT Area:     2.84 cm  RIGHT VENTRICLE RV S prime:     11.30 cm/s TAPSE (M-mode): 2.1 cm LEFT ATRIUM           Index        RIGHT ATRIUM           Index LA diam:      2.90 cm 1.50 cm/m   RA Area:     13.20 cm LA Vol (A2C): 30.6 ml 15.82 ml/m  RA Volume:   34.50 ml  17.83 ml/m LA Vol (A4C): 33.7 ml 17.42 ml/m  AORTIC VALVE AV Area (Vmax):    2.42 cm AV Area (Vmean):   2.15 cm AV Area (VTI):     2.32 cm AV Vmax:           152.01 cm/s AV Vmean:          104.513 cm/s AV VTI:            0.316 m AV Peak Grad:      9.2 mmHg AV Mean Grad:      4.9 mmHg LVOT Vmax:         130.00 cm/s LVOT Vmean:        79.100 cm/s LVOT VTI:          0.259 m  LVOT/AV VTI ratio: 0.82  AORTA Ao Root diam: 3.00 cm MITRAL VALVE MV Area (PHT): 2.60 cm     SHUNTS MV Decel Time: 292 msec     Systemic  VTI:  0.26 m MV E velocity: 86.10 cm/s   Systemic Diam: 1.90 cm MV A velocity: 105.00 cm/s MV E/A ratio:  0.82 Armida Lander MD Electronically signed by Armida Lander MD Signature Date/Time: 03/24/2024/12:51:19 PM    Final    MR BRAIN WO CONTRAST Result Date: 03/23/2024 CLINICAL DATA:  Left lower extremity weakness EXAM: MRI HEAD WITHOUT CONTRAST TECHNIQUE: Multiplanar, multiecho pulse sequences of the brain and surrounding structures were obtained without intravenous contrast. COMPARISON:  CT studies same day FINDINGS: Brain: Diffusion imaging does not show any acute or subacute infarction or other cause of restricted diffusion. No focal abnormality affects the brainstem or cerebellum. Cerebral hemispheres show an old cortical and subcortical infarction in the right parietooccipital junction region. There is atrophy and gliosis in this area. No other brain abnormality. No evidence of widespread small-vessel disease. No mass, recent hemorrhage, hydrocephalus or extra-axial collection. Mild hemosiderin deposition at the site of the old right-sided stroke. Vascular: Major vessels at the base of the brain show flow. Skull and upper cervical spine: Negative Sinuses/Orbits: Clear/normal Other: None IMPRESSION: No acute finding. 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 Electronically Signed   By: Bettylou Brunner M.D.   On: 03/23/2024 14:01   CT ANGIO HEAD NECK W WO CM W PERF (CODE STROKE) Result Date: 03/23/2024 CLINICAL DATA:  64 year old female code stroke presentation with lower extremity weakness. EXAM: CT ANGIOGRAPHY HEAD AND NECK CT PERFUSION BRAIN TECHNIQUE: Multidetector CT imaging of the head and neck was performed using the standard protocol during bolus administration of intravenous contrast. Multiplanar CT image  reconstructions and MIPs were obtained to evaluate the vascular anatomy. Carotid stenosis measurements (when applicable) are obtained utilizing NASCET criteria, using the distal internal carotid diameter as the denominator. Multiphase CT imaging of the brain was performed following IV bolus contrast injection. Subsequent parametric perfusion maps were calculated using RAPID software. RADIATION DOSE REDUCTION: This exam was performed according to the departmental dose-optimization program which includes automated exposure control, adjustment of the mA and/or kV according to patient size and/or use of iterative reconstruction technique. CONTRAST:  OMNIPAQUE  IOHEXOL  350 MG/ML SOLN COMPARISON:  Plain head CT today 1135 hours. FINDINGS: CT Brain Perfusion Findings: ASPECTS: 10, chronic encephalomalacia. CBF (<30%) Volume: 0mL Perfusion (Tmax>6.0s) volume: 0mL No CT Perfusion parameter abnormality is detected. Mismatch Volume: Not applicable Infarction Location:Not applicable CTA NECK Skeleton: C4-C5 cervical ACDF with appearance of solid arthrodesis. Some background osteopenia. No acute osseous abnormality identified. Upper chest: Negative. Other neck: Negative aside from partially retropharyngeal course of the right carotids, normal variant. Partial thyroid  atrophy Aortic arch: 3 vessel arch. Mild-to-moderate Calcified aortic atherosclerosis. Right carotid system: Mild brachiocephalic artery soft and calcified plaque without stenosis. Negative right CCA. Minimal plaque at the right carotid bifurcation. Partially retropharyngeal right ICA appears patent and negative to the skull base. Left carotid system: Negative left CCA. Minimal soft plaque at the left carotid bifurcation. Left ICA appears patent and normal to the skull base. Vertebral arteries: Minor plaque at the right subclavian origin without stenosis. Normal right vertebral artery origin. Right vertebral artery is patent to the skull base with no plaque or  stenosis. Proximal left subclavian artery soft plaque at its origin resulting in up to 50% stenosis as seen on series 9, image 124. Left vertebral origin remains normal. Fairly codominant left vertebral arteries patent and normal to the skull base. CTA HEAD Posterior circulation: Codominant distal vertebral arteries, PICA origins, vertebrobasilar junction, basilar artery, SCA and  PCA origins appear patent and normal. Posterior communicating arteries are diminutive or absent. Bilateral PCA branches are within normal limits. Anterior circulation: Both ICA siphons are patent. No significant left siphon plaque, no left siphon stenosis. Minimal right siphon calcified plaque without stenosis. Patent carotid termini. Normal MCA and ACA origins. Normal anterior communicating artery, bilateral ACA branches. Left MCA M1 segment and trifurcation are patent without stenosis. Right MCA M1 segment and bifurcation are patent without stenosis. Bilateral MCA branches are within normal limits. Venous sinuses: Patent. Anatomic variants: None significant. Review of the MIP images confirms the above findings IMPRESSION: 1. Negative CT Perfusion. And CTA is negative for large vessel occlusion. 2. Minimal atherosclerosis in the head and neck. 3. Aortic Atherosclerosis (ICD10-I70.0). 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 4. Prior cervical ACDF with evidence of arthrodesis. #1 communicated to Dr. Christiane Cowing at 12:02 pm on 03/23/2024 by text page via the [facility] messaging system. Electronically Signed   By: Marlise Simpers M.D.   On: 03/23/2024 12:04   CT HEAD CODE STROKE WO CONTRAST Result Date: 03/23/2024 CLINICAL DATA:  Code stroke. 63 year old female with lower extremity weakness. EXAM: CT HEAD WITHOUT CONTRAST TECHNIQUE: Contiguous axial images were obtained from the base of the skull through the vertex without intravenous contrast. RADIATION  DOSE REDUCTION: This exam was performed according to the departmental dose-optimization program which includes automated exposure control, adjustment of the mA and/or kV according to patient size and/or use of iterative reconstruction technique. COMPARISON:  Brain MRI 08/06/2023.  Head CT 06/22/2016. FINDINGS: Brain: Cerebral volume not significantly changed since 2017. Chronic posterior right hemisphere encephalomalacia including the junctions of the right inferior parietal, superior occipital and posterior temporal lobes appears unchanged. No midline shift, ventriculomegaly, mass effect, evidence of mass lesion, intracranial hemorrhage or evidence of cortically based acute infarction. Vascular: No suspicious intracranial vascular hyperdensity. Skull: Mild motion artifact. No acute osseous abnormality identified. Sinuses/Orbits: Visualized paranasal sinuses and mastoids are stable and well aerated. Other: No gaze deviation. No acute orbit or scalp soft tissue finding. ASPECTS [facility] Stroke Program Early CT Score) Total score (0-10 with 10 being normal): 10, chronic encephalomalacia. IMPRESSION: 1. No acute cortically based infarct or acute intracranial hemorrhage identified. ASPECTS 10. 2. Stable chronic right posterior hemisphere encephalomalacia. These results were communicated to Dr. Christiane Cowing at 11:55 am on 03/23/2024 by text page via the [facility] messaging system. Electronically Signed   By: Marlise Simpers M.D.   On: 03/23/2024 11:56     Subjective: Pt without any specific complaints today.   Discharge Exam: Vitals:   03/31/24 0405 03/31/24 1306  BP: (!) 127/50 123/76  Pulse: 69 92  Resp: 18 19  Temp: 98 F (36.7 C) 98.2 F (36.8 C)  SpO2: 94% 93%   Vitals:   03/30/24 2120 03/30/24 2353 03/31/24 0405 03/31/24 1306  BP: 127/63 121/60 (!) 127/50 123/76  Pulse: 66 72 69 92  Resp: 18  18 19   Temp: 98.5 F (36.9 C) 98.4 F (36.9 C) 98 F (36.7 C) 98.2 F (36.8 C)  TempSrc: Oral Oral Oral   SpO2: 94% 95%  94% 93%  Weight:      Height:       General: Pt is alert, awake, not in acute distress Cardiovascular: normal S1/S2 +, no rubs, no gallops Respiratory: CTA bilaterally, no wheezing, no rhonchi Abdominal: Soft, NT, ND, bowel sounds + Extremities: no edema, no cyanosis Neurological: nonfocal exam  The results of significant diagnostics from this hospitalization (including imaging, microbiology, ancillary and laboratory) are listed below for reference.     Microbiology: No results found for this or any previous visit (from the past 240 hours).   Labs: BNP (last 3 results) Recent Labs    10/05/23 1447  BNP 31.0   Basic Metabolic Panel: No results for input(s): "NA", "K", "CL", "CO2", "GLUCOSE", "BUN", "CREATININE", "CALCIUM ", "MG", "PHOS" in the last 168 hours. Liver Function Tests: No results for input(s): "AST", "ALT", "ALKPHOS", "BILITOT", "PROT", "ALBUMIN" in the last 168 hours. No results for input(s): "LIPASE", "AMYLASE" in the last 168 hours. No results for input(s): "AMMONIA" in the last 168 hours. CBC: No results for input(s): "WBC", "NEUTROABS", "HGB", "HCT", "MCV", "PLT" in the last 168 hours. Cardiac Enzymes: No results for input(s): "CKTOTAL", "CKMB", "CKMBINDEX", "TROPONINI" in the last 168 hours. BNP: Invalid input(s): "POCBNP" CBG: No results for input(s): "GLUCAP" in the last 168 hours. D-Dimer No results for input(s): "DDIMER" in the last 72 hours. Hgb A1c No results for input(s): "HGBA1C" in the last 72 hours. Lipid Profile No results for input(s): "CHOL", "HDL", "LDLCALC", "TRIG", "CHOLHDL", "LDLDIRECT" in the last 72 hours. Thyroid  function studies No results for input(s): "TSH", "T4TOTAL", "T3FREE", "THYROIDAB" in the last 72 hours.  Invalid input(s): "FREET3" Anemia work up No results for input(s): "VITAMINB12", "FOLATE", "FERRITIN", "TIBC", "IRON", "RETICCTPCT" in the last 72 hours. Urinalysis    Component Value Date/Time   COLORURINE YELLOW  03/24/2024 1345   APPEARANCEUR HAZY (A) 03/24/2024 1345   APPEARANCEUR Clear 10/27/2014 1036   LABSPEC 1.033 (H) 03/24/2024 1345   LABSPEC 1.010 10/27/2014 1036   PHURINE 5.0 03/24/2024 1345   GLUCOSEU NEGATIVE 03/24/2024 1345   GLUCOSEU Negative 10/27/2014 1036   GLUCOSEU NEG mg/dL 16/08/9603 5409   HGBUR NEGATIVE 03/24/2024 1345   BILIRUBINUR NEGATIVE 03/24/2024 1345   BILIRUBINUR negative 02/29/2020 1508   BILIRUBINUR Negative 10/27/2014 1036   KETONESUR 5 (A) 03/24/2024 1345   PROTEINUR 100 (A) 03/24/2024 1345   UROBILINOGEN 0.2 02/29/2020 1508   UROBILINOGEN 1.0 05/17/2015 1334   NITRITE NEGATIVE 03/24/2024 1345   LEUKOCYTESUR TRACE (A) 03/24/2024 1345   LEUKOCYTESUR Negative 10/27/2014 1036   Sepsis Labs No results for input(s): "WBC" in the last 168 hours.  Invalid input(s): "PROCALCITONIN", "LACTICIDVEN" Microbiology No results found for this or any previous visit (from the past 240 hours).  Time coordinating discharge: 40 mins  SIGNED:  Faustino Hook, MD  [facility] 03/31/2024, 5:22 PM How to contact the [facility] Attending or Consulting provider 7A - 7P or covering provider during after hours 7P -7A, for this patient?  Check the care team in [facility] and look for a) attending/consulting [facility] provider listed and b) the [facility] team listed Log into [email or web address] and use [place] universal password to access. If you do not have the password, please contact the hospital operator. Locate the [facility] provider you are looking for under [facility] and page to a number that you can be directly reached. If you still have difficulty reaching the provider, please page the [facility] (Director on Call) for the Hospitalists listed on amion for assistance.

## 2024-03-31 NOTE — Telephone Encounter (Signed)
 Copied from CRM [id number]. Topic: Referral - Request for Referral >> Mar 31, 2024  1:34 PM Georgeann Kindred wrote: Did the patient discuss referral with their provider in the last year? Yes (If No - schedule appointment) (If Yes - send message)  Appointment offered? No  Type of order/referral and detailed reason for visit: [facility]   Preference of office, provider, location: [facility] [address], [place], [place]  If referral order, have you been seen by this specialty before? No (If Yes, this issue or another issue? When? Where?  Can we respond through MyChart? No

## 2024-03-31 NOTE — Progress Notes (Signed)
PT discharged.

## 2024-03-31 NOTE — Plan of Care (Signed)
 Problem: Education: Goal: Knowledge of General Education information will improve Description: Including pain rating scale, medication(s)/side effects and non-pharmacologic comfort measures Outcome: Progressing   Problem: Clinical Measurements: Goal: Ability to maintain clinical measurements within normal limits will improve Outcome: Progressing Goal: Will remain free from infection Outcome: Progressing Goal: Diagnostic test results will improve Outcome: Progressing Goal: Respiratory complications will improve Outcome: Progressing Goal: Cardiovascular complication will be avoided Outcome: Progressing   Problem: Nutrition: Goal: Adequate nutrition will be maintained Outcome: Progressing   Problem: Coping: Goal: Level of anxiety will decrease Outcome: Progressing   Problem: Elimination: Goal: Will not experience complications related to bowel motility Outcome: Progressing Goal: Will not experience complications related to urinary retention Outcome: Progressing   Problem: Pain Managment: Goal: General experience of comfort will improve and/or be controlled Outcome: Progressing   Problem: Safety: Goal: Ability to remain free from injury will improve Outcome: Progressing   Problem: Education: Goal: Knowledge of secondary prevention will improve (MUST DOCUMENT ALL) Outcome: Progressing Goal: Knowledge of patient specific risk factors will improve (DELETE if not current risk factor) Outcome: Progressing   Problem: Ischemic Stroke/TIA Tissue Perfusion: Goal: Complications of ischemic stroke/TIA will be minimized Outcome: Progressing   Problem: Coping: Goal: Will verbalize positive feelings about self Outcome: Progressing Goal: Will identify appropriate support needs Outcome: Progressing   Problem: Health Behavior/Discharge Planning: Goal: Ability to manage health-related needs will improve Outcome: Progressing Goal: Goals will be collaboratively established with  patient/family Outcome: Progressing   Problem: Self-Care: Goal: Verbalization of feelings and concerns over difficulty with self-care will improve Outcome: Progressing   Problem: Nutrition: Goal: Risk of aspiration will decrease Outcome: Progressing Goal: Dietary intake will improve Outcome: Progressing   Problem: Coping: Goal: Level of anxiety will decrease Outcome: Progressing   Problem: Coping: Goal: Level of anxiety will decrease Outcome: Progressing   Problem: Education: Goal: Knowledge of General Education information will improve Description: Including pain rating scale, medication(s)/side effects and non-pharmacologic comfort measures Outcome: Progressing   Problem: Clinical Measurements: Goal: Ability to maintain clinical measurements within normal limits will improve Outcome: Progressing Goal: Will remain free from infection Outcome: Progressing Goal: Diagnostic test results will improve Outcome: Progressing Goal: Respiratory complications will improve Outcome: Progressing Goal: Cardiovascular complication will be avoided Outcome: Progressing   Problem: Nutrition: Goal: Adequate nutrition will be maintained Outcome: Progressing   Problem: Coping: Goal: Level of anxiety will decrease Outcome: Progressing   Problem: Elimination: Goal: Will not experience complications related to bowel motility Outcome: Progressing Goal: Will not experience complications related to urinary retention Outcome: Progressing   Problem: Pain Managment: Goal: General experience of comfort will improve and/or be controlled Outcome: Progressing   Problem: Safety: Goal: Ability to remain free from injury will improve Outcome: Progressing   Problem: Education: Goal: Knowledge of secondary prevention will improve (MUST DOCUMENT ALL) Outcome: Progressing Goal: Knowledge of patient specific risk factors will improve (DELETE if not current risk factor) Outcome: Progressing    Problem: Ischemic Stroke/TIA Tissue Perfusion: Goal: Complications of ischemic stroke/TIA will be minimized Outcome: Progressing   Problem: Coping: Goal: Will verbalize positive feelings about self Outcome: Progressing Goal: Will identify appropriate support needs Outcome: Progressing   Problem: Health Behavior/Discharge Planning: Goal: Ability to manage health-related needs will improve Outcome: Progressing Goal: Goals will be collaboratively established with patient/family Outcome: Progressing   Problem: Self-Care: Goal: Verbalization of feelings and concerns over difficulty with self-care will improve Outcome: Progressing   Problem: Nutrition: Goal: Risk of aspiration will decrease Outcome: Progressing Goal: Dietary intake  will improve Outcome: Progressing

## 2024-03-31 NOTE — Progress Notes (Signed)
 Mobility Specialist Progress Note:    03/31/24 0930  Mobility  Activity Ambulated with assistance in room;Ambulated with assistance to bathroom;Transferred from bed to chair  Level of Assistance Contact guard assist, steadying assist  Assistive Device Front wheel walker  Distance Ambulated (ft) 45 ft  Range of Motion/Exercises Active;All extremities  Activity Response Tolerated well  Mobility Referral Yes  Mobility visit 1 Mobility  Mobility Specialist Start Time (ACUTE ONLY) 0910  Mobility Specialist Stop Time (ACUTE ONLY) 0930  Mobility Specialist Time Calculation (min) (ACUTE ONLY) 20 min   Pt received in bed, agreeable to mobility. Required CGA to stand and ambulate with RW. Tolerated well, asx throughout. Left pt in chair, call bell in reach. All needs met.  Glinda Lapping Mobility Specialist Please contact via Special educational needs teacher or  Rehab office at [phone]

## 2024-03-31 NOTE — Plan of Care (Signed)
  Problem: Education: Goal: Knowledge of General Education information will improve Description: Including pain rating scale, medication(s)/side effects and non-pharmacologic comfort measures Outcome: Progressing   Problem: Clinical Measurements: Goal: Ability to maintain clinical measurements within normal limits will improve Outcome: Progressing Goal: Will remain free from infection Outcome: Progressing Goal: Diagnostic test results will improve Outcome: Progressing Goal: Respiratory complications will improve Outcome: Progressing Goal: Cardiovascular complication will be avoided Outcome: Progressing   Problem: Nutrition: Goal: Adequate nutrition will be maintained Outcome: Progressing   Problem: Coping: Goal: Level of anxiety will decrease Outcome: Progressing   Problem: Elimination: Goal: Will not experience complications related to bowel motility Outcome: Progressing Goal: Will not experience complications related to urinary retention Outcome: Progressing   Problem: Pain Managment: Goal: General experience of comfort will improve and/or be controlled Outcome: Progressing   Problem: Safety: Goal: Ability to remain free from injury will improve Outcome: Progressing

## 2024-03-31 NOTE — Progress Notes (Signed)
 Pt escorted to main entrance via wc. While in transit pt stated, "Wait, wait, I'm falling. I'm slipping". Pt was well positioned in the wc at all times and did not move. At main entrance pt continued to state that she was too weak and her left foot "wasn't right".  Pt was able to stand, pivot and get to front seat of her sister's car c/assistance. Pt dc'd home c/aide and family assist.

## 2024-03-31 NOTE — Discharge Instructions (Signed)
 IMPORTANT INFORMATION: PAY CLOSE ATTENTION   PHYSICIAN DISCHARGE INSTRUCTIONS  Follow with Primary care provider  Sheryl Brooks, MD  and other consultants as instructed by your Hospitalist Physician  SEEK MEDICAL CARE OR RETURN TO EMERGENCY ROOM IF SYMPTOMS COME BACK, WORSEN OR NEW PROBLEM DEVELOPS   Please note: You were cared for by a hospitalist during your hospital stay. Every effort will be made to forward records to your primary care provider.  You can request that your primary care provider send for your hospital records if they have not received them.  Once you are discharged, your primary care physician will handle any further medical issues. Please note that NO REFILLS for any discharge medications will be authorized once you are discharged, as it is imperative that you return to your primary care physician (or establish a relationship with a primary care physician if you do not have one) for your post hospital discharge needs so that they can reassess your need for medications and monitor your lab values.  Please get a complete blood count and chemistry panel checked by your Primary MD at your next visit, and again as instructed by your Primary MD.  Get Medicines reviewed and adjusted: Please take all your medications with you for your next visit with your Primary MD  Laboratory/radiological data: Please request your Primary MD to go over all hospital tests and procedure/radiological results at the follow up, please ask your primary care provider to get all Hospital records sent to his/her office.  In some cases, they will be blood work, cultures and biopsy results pending at the time of your discharge. Please request that your primary care provider follow up on these results.  If you are diabetic, please bring your blood sugar readings with you to your follow up appointment with primary care.    Please call and make your follow up appointments as soon as possible.    Also  Note the following: If you experience worsening of your admission symptoms, develop shortness of breath, life threatening emergency, suicidal or homicidal thoughts you must seek medical attention immediately by calling 911 or calling your MD immediately  if symptoms less severe.  You must read complete instructions/literature along with all the possible adverse reactions/side effects for all the Medicines you take and that have been prescribed to you. Take any new Medicines after you have completely understood and accpet all the possible adverse reactions/side effects.   Do not drive when taking Pain medications or sleeping medications (Benzodiazepines)  Do not take more than prescribed Pain, Sleep and Anxiety Medications. It is not advisable to combine anxiety,sleep and pain medications without talking with your primary care practitioner  Special Instructions: If you have smoked or chewed Tobacco  in the last 2 yrs please stop smoking, stop any regular Alcohol  and or any Recreational drug use.  Wear Seat belts while driving.  Do not drive if taking any narcotic, mind altering or controlled substances or recreational drugs or alcohol.

## 2024-03-31 NOTE — Plan of Care (Signed)

## 2024-03-31 NOTE — TOC Transition Note (Signed)
 Transition of Care [facility]) - Discharge Note   Patient Details  Name: Sheryl CHEESE MRN: [id number] Date of Birth: [birth year]  [facility]) CM/SW Contact:  [name], RN Phone Number: 03/31/2024, 1:35 PM   Clinical Narrative:   CM on the phone with Aurora Blowers at [facility] to give an update. CM calling this morning to follow up with the additional facilities provided by [facility], [employer] in [place], [place] with [place] for his area in [place]. They are not taking [name]. Explained that patient is medically ready, improving with PT and MD is discharging. Aurora Blowers is on the phone with their RN, she is added PCS hours to [facility] and wellness for this patient. Patient needs to call them to tell them she is discharging home. CM asked for assistance with [facility] to get HHPT, we have not accepting agencies. They provided [facility] number [phone]. Aurora Blowers stated patient's PCP sent a referral on 4/21 they should be setting up serviced. CM called to confirm, they did receive the referral but declined due to not being in the network with [facility]. Helen updated.  CM spoke with her Sister, she is very upset, feels like we do not understand and have no empathy in sending her home. CM explained what we have done over the last week to assist in getting her placement.  Explained patient did not participate with ALF assessment. Patient has improved, She walked 60 Feet with PT, not dragging her foot as she stated. Sister works 6 days a week and mother and brother to care for. CM got a grievance number for [name] to give to patient and family.  Aurora Blowers explained that the family could call due to no accepting bed offers after multiple attempts by the hospital with facilities on [facility] list. They all declined, staying they were not accept [name].  CM explained to her sister and took information to the patient and reminded her to call [facility] services.   For Grievance call Brigitte Canard - case manager  [phone] ext [phone].  CM called her sister back , she requested to have patient ready she gets off work at [employer] and will be her around 7Pm, RN updated.    Final next level of care: Home/Self Care (AIde) Barriers to Discharge: No SNF bed   Patient Goals and CMS Choice Patient states their goals for this hospitalization and ongoing recovery are:: Strengthening; To get home. CMS Medicare.gov Compare Post Acute Care list provided to:: Patient Choice offered to / list presented to : Patient [place] ownership interest in [facility].provided to:: Patient    Discharge Placement                 Name of family member notified: sister Patient and family notified of of transfer: 03/31/24  Discharge Plan and Services Additional resources added to the After Visit Summary for   In-house Referral: Clinical Social Work Discharge Planning Services: CM Consult Post Acute Care Choice: Skilled Nursing Facility                Social Drivers of Health (SDOH) Interventions SDOH Screenings   Food Insecurity: No Food Insecurity (03/23/2024)  Housing: Low Risk  (03/23/2024)  Transportation Needs: Unmet Transportation Needs (03/23/2024)  Utilities: Not At Risk (03/23/2024)  Alcohol Screen: Low Risk  (07/18/2021)  Depression (PHQ2-9): High Risk (10/20/2023)  Social Connections: Socially Isolated (03/23/2024)  Tobacco Use: Medium Risk (03/25/2024)

## 2024-04-01 ENCOUNTER — Telehealth: Payer: Self-pay

## 2024-04-01 ENCOUNTER — Other Ambulatory Visit: Payer: Self-pay

## 2024-04-01 DIAGNOSIS — I639 Cerebral infarction, unspecified: Secondary | ICD-10-CM

## 2024-04-01 DIAGNOSIS — R29898 Other symptoms and signs involving the musculoskeletal system: Secondary | ICD-10-CM

## 2024-04-01 DIAGNOSIS — Z5982 Transportation insecurity: Secondary | ICD-10-CM

## 2024-04-01 DIAGNOSIS — G6289 Other specified polyneuropathies: Secondary | ICD-10-CM

## 2024-04-01 DIAGNOSIS — R269 Unspecified abnormalities of gait and mobility: Secondary | ICD-10-CM

## 2024-04-01 DIAGNOSIS — R531 Weakness: Secondary | ICD-10-CM

## 2024-04-01 NOTE — Transitions of Care (Post Inpatient/ED Visit) (Signed)
 04/01/2024  Name: Sheryl Hall MRN: [id number] DOB: 1961-08-02  Today's TOC FU Call Status: Today's TOC FU Call Status:: Successful TOC FU Call Completed TOC FU Call Complete Date: 04/01/24 Patient's Name and Date of Birth confirmed.  Transition Care Management Follow-up Telephone Call Date of Discharge: 03/31/24 [facility] Type of Discharge: Inpatient Admission Primary Inpatient Discharge Diagnosis:: bipolar How have you been since you were released from the hospital?: Same Any questions or concerns?: No  Items Reviewed: Did you receive and understand the discharge instructions provided?: Yes Medications obtained,verified, and reconciled?: Yes (Medications Reviewed) Any new allergies since your discharge?: No Dietary orders reviewed?: NA Do you have support at home?: No  Medications Reviewed Today: Medications Reviewed Today     Reviewed by Darrall Ellison, LPN (Licensed Practical Nurse) on 04/01/24 at [phone]  Med List Status: <None>   Medication Order Taking? Sig Documenting Provider Last Dose Status Informant  ARIPiprazole  (ABILIFY ) 2 MG tablet [id number] No Take 1 tablet (2 mg total) by mouth at bedtime. Austine Lefort, MD 03/22/2024 Bedtime Active Pharmacy Records, Self  aspirin  EC 81 MG tablet [id number]  Take 1 tablet (81 mg total) by mouth daily. Swallow whole. Johnson, Clanford L, MD  Active   atorvastatin  (LIPITOR) 40 MG tablet [id number]  Take 1 tablet (40 mg total) by mouth daily. Johnson, Clanford L, MD  Active   escitalopram  (LEXAPRO ) 10 MG tablet [id number] No Take 1 tablet (10 mg total) by mouth daily. Austine Lefort, MD 03/23/2024 Morning Active Pharmacy Records, Self  lamoTRIgine  (LAMICTAL ) 25 MG tablet [id number] No Take 1 tablet (25 mg total) by mouth 2 (two) times daily. Austine Lefort, MD 03/23/2024 Morning Active Pharmacy Records, Self  levETIRAcetam  (KEPPRA ) 1000 MG tablet [id number] No Take 1 tablet (1,000 mg total) by mouth 2  (two) times daily. Austine Lefort, MD 03/23/2024 Morning Active Pharmacy Records, Self  levothyroxine  (SYNTHROID ) 50 MCG tablet [id number] No TAKE ONE TABLET BY MOUTH EVERY DAY. Austine Lefort, MD 03/23/2024 Morning Active Pharmacy Records, Self  Patient taking differently:  Discontinued 02/29/20 1516 melatonin 3 MG TABS tablet [id number]  Take 1 tablet (3 mg total) by mouth at bedtime. Johnson, Clanford L, MD  Active   omeprazole  (PRILOSEC) 40 MG capsule [id number] No Take 1 capsule (40 mg total) by mouth in the morning and at bedtime. Austine Lefort, MD 03/23/2024 Morning Active Pharmacy Records, Self  oxybutynin  2.5 MG TABS [id number]  Take 2.5 mg by mouth every 8 (eight) hours as needed for bladder spasms. Johnson, Clanford L, MD  Active   polyethylene glycol (MIRALAX  / GLYCOLAX ) 17 g packet [id number]  Take 17 g by mouth daily as needed for mild constipation. Johnson, Clanford L, MD  Active   pregabalin  (LYRICA ) 25 MG capsule [id number] No Take 1 capsule (25 mg total) by mouth 2 (two) times daily. Austine Lefort, MD 03/23/2024 Morning Active Pharmacy Records, Self  Med List Note (Ashe, Dana K, CPhT 07/21/19 1807): Pharmacy records were used to confirm all medications            Home Care and Equipment/Supplies: Were Home Health Services Ordered?: Yes Name of Home Health Agency:: unknown Has Agency set up a time to come to your home?: No Any new equipment or medical supplies ordered?: NA  Functional Questionnaire: Do you need assistance with bathing/showering or dressing?: Yes Do you need assistance with meal preparation?: Yes Do you need assistance with eating?: No Do you have difficulty maintaining  continence: Yes Do you need assistance with getting out of bed/getting out of a chair/moving?: No Do you have difficulty managing or taking your medications?: No  Follow up appointments reviewed: PCP Follow-up appointment confirmed?: Yes Date of PCP follow-up appointment?:  04/07/24 Follow-up Provider: [facility] Follow-up appointment confirmed?: No Reason Specialist Follow-Up Not Confirmed: Patient has Specialist Provider Number and will Call for Appointment Do you need transportation to your follow-up appointment?: Yes Transportation Need Intervention Addressed By:: AMB Referral For SDOH Needs Do you understand care options if your condition(s) worsen?: Yes-patient verbalized understanding    SIGNATURE Darrall Ellison, LPN [facility] Nurse Health Advisor Direct Dial [phone]

## 2024-04-01 NOTE — Telephone Encounter (Signed)
 Copied from CRM [id number]. Topic: Referral - Request for Referral >> Mar 31, 2024  1:34 PM Georgeann Kindred wrote: Did the patient discuss referral with their provider in the last year? Yes (If No - schedule appointment) (If Yes - send message)  Appointment offered? No  Type of order/referral and detailed reason for visit: [facility]   Preference of office, provider, location: [facility] [address], [place], [place]  If referral order, have you been seen by this specialty before? No (If Yes, this issue or another issue? When? Where?  Can we respond through MyChart? No >> Mar 31, 2024  3:05 PM DeAngela L wrote: Purnell Bruch T with [name] calling back to get any updated information on the referral for the Patient to get approved for [facility] home care before being discharged from the hospital today 03/31/24, they couldn't find a skilled nursing previously before her discharge date..so their try get her PT with Hilaria Loveless T is asking for a call once the referral is put in just so she can note this on her end Call back number [phone] ext [phone] leaving a detailed vm is ok

## 2024-04-04 ENCOUNTER — Ambulatory Visit: Payer: MEDICAID

## 2024-04-07 ENCOUNTER — Ambulatory Visit: Payer: MEDICAID | Admitting: Family Medicine

## 2024-04-07 DIAGNOSIS — M6281 Muscle weakness (generalized): Secondary | ICD-10-CM

## 2024-04-07 DIAGNOSIS — E538 Deficiency of other specified B group vitamins: Secondary | ICD-10-CM

## 2024-04-07 DIAGNOSIS — I6930 Unspecified sequelae of cerebral infarction: Secondary | ICD-10-CM | POA: Diagnosis not present

## 2024-04-07 DIAGNOSIS — Z5982 Transportation insecurity: Secondary | ICD-10-CM | POA: Diagnosis not present

## 2024-04-07 DIAGNOSIS — G6289 Other specified polyneuropathies: Secondary | ICD-10-CM

## 2024-04-07 DIAGNOSIS — I639 Cerebral infarction, unspecified: Secondary | ICD-10-CM

## 2024-04-07 NOTE — Progress Notes (Signed)
 Subjective:    Patient ID: Sheryl Hall, female    DOB: March 06, 1961, 63 y.o.   MRN: [id number]  HPI 03/03/24 Patient is now seeing a psychiatrist who is managing her bipolar disorder with Abilify , Lexapro , and Lamictal .  Patient states that she is doing relatively well.  However she lives at home alone.  She seldom gets up and walks if she is distally confined to wheelchair.  Due to the deconditioning, her legs are very weak.  She has difficult time standing.  She reports lack of mobility.  We have tried to arrange physical therapy however we could not find a home health physical therapy agency that would accept her into the patient denied the ability to be transported to a facility.  She now request that I have her admitted to a nursing facility for short-term rehab.  At that time, my plan was: I believe the patient would benefit from formal physical therapy as an outpatient.  However she insist on being admitted to a short-term rehab facility.  Therefore I provided her the names of several facilities in the area that she and her daughter can contact to check on bed availability.  Once she has decided on the facility, I will be happy to complete the [place] 2 form.  Her weakness is due to a combination of deconditioning coupled with peripheral neuropathy.  I believe the majority is now secondary to deconditioning.  While the patient is here today I will check baseline lab work including a CBC CMP and a TSH  04/07/24 Patient was recently admitted to the hospital.  I have included her discharge below reference. Admit date: 03/23/2024 Discharge date: 03/31/2024 Admitted From:  Home  Disposition:  Home   Recommendations for Outpatient Follow-up:  Follow up with PCP in 1 weeks Follow up with neurologist Dr. Godwin Lat in 2 weeks   Home Health:  Aide   Discharge Condition: STABLE   CODE STATUS: FULL DIET: soft foods, advance as tolerated     Brief Hospitalization Summary: Please see all hospital notes,  images, labs for full details of the hospitalization. Admission provider HPI:  63 y.o. female with medical history significant for hypertension, dyslipidemia, prior CVA, GERD, hypothyroidism, bipolar disorder, anxiety/depression, and arthritis who presented to the ED for left-sided weakness and numbness and difficulty walking that began at approximately 8 AM when she woke up.  She normally uses wheelchair at home, but is Hall to transfer from wheelchair to couch and was not Hall to do so today.  She was admitted for CVA evaluation and brain MRI negative.  She has been seen by PT who is now recommending SNF placement, but unfortunately she cannot be placed given her issues with insurance.  Patient is medically stable for discharge.    Hospital course by listed problems addressed    Left-sided hemiparesis concern for TIA versus functional neurological disorder CVA has been RULED OUT - Appreciate neurology recommendations, brain MRI with no acute findings. - 2D echocardiogram as noted below with preserved LVEF - Continue aspirin  81 mg daily for secondary prevention. - Continue the use of Lipitor; LDL 136. - A1c 5.8%, continue monitor outpatient; modified carbohydrate diet discussed with patient. - PT recommending SNF placement, she cannot be placed given insurance issues and will need ongoing PT evaluation. TOC was Hall to arrange increased assistance from Aide at home but not Hall to arrange any form of SNF rehab or HH due to none of them accepting patient's insurance.  - please see  TOC notes regarding disposition.  I was told only option is for patient to go home with the increased assistance from personal care aides arranged by her insurance company.    Seizure disorder - Continue home Keppra  - No seizure activity appreciated.   Bipolar disorder/anxiety/depression - Continue home medications - currently has been stable  - No suicidal ideation or hallucination.   Hypothyroidism - Continue  levothyroxine    GERD - Continue the use of PPI - Lifestyle changes discussed with patient.     History of CVA/dyslipidemia - Continue statin and aspirin  for secondary prevention   Overactive bladder -continue oxybutynin    Constipation - Bowel movement achieved with bisacodyl  suppository - Continue to maintain adequate hydration - Patient advised to increase fiber intake and miralax  as ordered.    Discharge Diagnoses:  Principal Problem:   Leg weakness Active Problems:   Depression   Bipolar disorder (HCC)   Hyperthyroidism   Hyperlipidemia   Constipation   Weakness generalized   After leaving the hospital at [facility], the patient went to [facility] emergency room for admission for weakness.Aaron Aas  [facility] declined admission given that there was not an ongoing medical emergency: ED Course  Medical Decision Making 63 year old female who was discharged from [facility] health after an 8-day visit where she presented with left lower extremity weakness. A CVA was ruled out during patient's evaluation and she was diagnosed with a TIA. Per the discharge summary from the hospitalist they were hoping to get patient to a skilled nursing facility however secondary to her insurance that was not possible. Patient was considered medically stable for discharge to home. - On presentation family member reported no change in patient's physical exam, mentation or presentation since they left [place] and wrote to home. They came to this facility reportedly because they ask what they should do next since they cannot get into a skilled nursing facility they were told to go to another emergency department for reevaluation - After reviewing patient's discharge summary which included her hospital course I informed him that I would be willing to repeat some blood work however would not be repeating the CAT scans and MRIs performed at [facility]. I informed him that if there were some electrolyte abnormalities that required  treatment/admission they would be addressed - Family member declined stating that nothing should have changed within the last 2 or 3 hours since they left the other hospital and patient was subsequently discharged to home   Patient today scheduled a telephone visit for follow-up with me.  Phone call began at 954.  Phone call concluded at 1016.  Patient consents to be seen via telephone.  She is currently at home.  I am currently in the office.  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  Patient is currently on aspirin  81 mg a day.  Her LDL cholesterol is greater than 130.  I started her on atorvastatin  40 mg a day.  She could certainly a little in 3 months.  She is currently receiving vitamin B12 injections monthly.  Her last B12 level was just below 300 in November.  Patient is due to recheck this at her earliest convenience.  She was also started on oxybutynin  in the hospital 2.5 mg 3 times daily for urinary frequency and urgency.  This is held.  She was going every hour.  She is now going 2 or 3 times at night.  Her insurance case Production designer, theatre/television/film, has contacted the  [facility].  They are going to admit the patient for 30-day short-term rehab.  She is waiting for bed availability.  They are supposed to contact her as soon as a bed becomes available.  Patient denies any chest pain or chest of breath or dyspnea exertion.  She denies any leg swelling syncope fatigue.  She denies any burning with urination. Past Medical History:  Diagnosis Date   Anxiety    Arthritis    Avascular necrosis of hip (HCC)    left   Bipolar 1 disorder (HCC)    Chronic left hip pain    Chronic nausea    Depression    Diverticulosis of colon    Family hx of colon cancer    age 9-father   Folate deficiency    GERD (gastroesophageal reflux disease)    Headache    Heart murmur    Hx of abnormal Pap smear    Hyperlipidemia    Hypothyroidism    Keratosis, actinic    Low back  pain 03/01/2013   MRI with multiple levels of disc bulge   Panic attacks    Polysubstance abuse (HCC)    PTSD (post-traumatic stress disorder)    S/P colonoscopy 05/30/2010   LAX sphincter tone, anal papilla, left-sided diverticulosis, normal random biopsies,, 1 polyp-TA   S/P endoscopy 05/30/2010   Dr Rourk-> non--critical Schatzki's ring, s/p 57F dilation   Seizures (HCC)    Stroke (HCC) 2014   Right parietal, no deficits    Tubular adenoma of colon 05/30/2010   Next colonoscopy 05/2015   UTI (urinary tract infection)    Past Surgical History:  Procedure Laterality Date   COLONOSCOPY     every 5 years   COLONOSCOPY  05/30/2010   RMR:lax anal sphincter tone,anal papilla,otherwise normal/left-sided diverticula   ESOPHAGOGASTRODUODENOSCOPY  05/30/2010   ZOX:[id number] ring/small HH otherwise normal   EXTERNAL EAR SURGERY Left 12 years ago   skin graft from behind ear put in ear canal   JOINT REPLACEMENT     NECK SURGERY  10 years ago   SKIN CANCER DESTRUCTION     TOTAL HIP ARTHROPLASTY Left 04/19/2014   Procedure: LEFT TOTAL HIP ARTHROPLASTY ANTERIOR APPROACH;  Surgeon: Aurther Blue, MD;  Location: [facility];  Service: Orthopedics;  Laterality: Left;   TOTAL HIP ARTHROPLASTY Right 05/23/2015   Procedure: RIGHT TOTAL HIP ARTHROPLASTY ANTERIOR APPROACH;  Surgeon: Liliane Rei, MD;  Location: [facility];  Service: Orthopedics;  Laterality: Right;   Current Outpatient Medications on File Prior to Visit  Medication Sig Dispense Refill   ARIPiprazole  (ABILIFY ) 2 MG tablet Take 1 tablet (2 mg total) by mouth at bedtime. 30 tablet 1   aspirin  EC 81 MG tablet Take 1 tablet (81 mg total) by mouth daily. Swallow whole.     atorvastatin  (LIPITOR) 40 MG tablet Take 1 tablet (40 mg total) by mouth daily. 30 tablet 1   escitalopram  (LEXAPRO ) 10 MG tablet Take 1 tablet (10 mg total) by mouth daily. 30 tablet 1   lamoTRIgine  (LAMICTAL ) 25 MG tablet Take 1 tablet (25 mg total) by mouth 2 (two)  times daily. 60 tablet 1   levETIRAcetam  (KEPPRA ) 1000 MG tablet Take 1 tablet (1,000 mg total) by mouth 2 (two) times daily. 60 tablet 3   levothyroxine  (SYNTHROID ) 50 MCG tablet TAKE ONE TABLET BY MOUTH EVERY DAY. 90 tablet 3   melatonin 3 MG TABS tablet Take 1 tablet (3 mg total) by mouth at bedtime.  omeprazole  (PRILOSEC) 40 MG capsule Take 1 capsule (40 mg total) by mouth in the morning and at bedtime. 60 capsule 3   oxybutynin  2.5 MG TABS Take 2.5 mg by mouth every 8 (eight) hours as needed for bladder spasms. 90 tablet 0   polyethylene glycol (MIRALAX  / GLYCOLAX ) 17 g packet Take 17 g by mouth daily as needed for mild constipation. 30 each 0   pregabalin  (LYRICA ) 25 MG capsule Take 1 capsule (25 mg total) by mouth 2 (two) times daily. 60 capsule 3   [DISCONTINUED] LINZESS  72 MCG capsule TAKE ONE CAPSULE ORALLY EVERY MORNING BEFORE BREAKFAST. (Patient taking differently: Take 72 mcg by mouth daily before breakfast. ) 30 capsule 1   No current facility-administered medications on file prior to visit.   Allergies  Allergen Reactions   Lamictal  [Lamotrigine ] Rash   Social History   Socioeconomic History   Marital status: Divorced    Spouse name: Not on file   Number of children: 1   Years of education: Not on file   Highest education level: Not on file  Occupational History   Occupation: disabled  Tobacco Use   Smoking status: Former    Current packs/day: 0.00    Average packs/day: 0.5 packs/day for 30.0 years (15.0 ttl pk-yrs)    Types: Cigarettes    Start date: 04/24/1992    Quit date: 04/24/2022    Years since quitting: 1.9   Smokeless tobacco: Never   Tobacco comments:    trying to quit  Vaping Use   Vaping status: Never Used  Substance and Sexual Activity   Alcohol use: No    Alcohol/week: 0.0 standard drinks of alcohol   Drug use: No    Comment: hx cocaine abuse   Sexual activity: Never    Birth control/protection: None  Other Topics Concern   Not on file   Social History Narrative   Patient lives at home alone and she is single.    Disabled.   Education college education.   Right handed.   Caffeine mountain dew four daily.    Social Drivers of Corporate investment banker Strain: Not on file  Food Insecurity: No Food Insecurity (03/23/2024)   Hunger Vital Sign    Worried About Running Out of Food in the Last Year: Never true    Ran Out of Food in the Last Year: Never true  Transportation Needs: Unmet Transportation Needs (03/23/2024)   PRAPARE - Administrator, Civil Service (Medical): Yes    Lack of Transportation (Non-Medical): Yes  Physical Activity: Not on file  Stress: Not on file  Social Connections: Socially Isolated (03/23/2024)   Social Connection and Isolation Panel [NHANES]    Frequency of Communication with Friends and Family: More than three times a week    Frequency of Social Gatherings with Friends and Family: More than three times a week    Attends Religious Services: Never    Database administrator or Organizations: No    Attends Banker Meetings: Never    Marital Status: Divorced  Catering manager Violence: Not At Risk (03/23/2024)   Humiliation, Afraid, Rape, and Kick questionnaire    Fear of Current or Ex-Partner: No    Emotionally Abused: No    Physically Abused: No    Sexually Abused: No      Review of Systems  Psychiatric/Behavioral:  Positive for decreased concentration and dysphoric mood. Negative for behavioral problems, confusion, hallucinations, self-injury, sleep disturbance and suicidal  ideas. The patient is nervous/anxious and is hyperactive.        Objective:          Assessment & Plan:  Cerebrovascular accident (CVA), unspecified mechanism (HCC)  Other polyneuropathy  Transportation insecurity  Muscle weakness  B12 deficiency Based on the old stroke seen on the MRI I would like to get her LDL cholesterol is close to 55 is possible.  Continue aspirin  and  atorvastatin .  Recheck a CMP and a fasting lipid panel in 3 months.  I would also like to check a vitamin B12 level at the patient's next office visit to ensure that she is getting sufficient parenteral B12 given her history of polyneuropathy.  I believe the patient would dramatically benefit from intensive physical therapy.  Therefore I am hopeful [facility] will be Hall to get her  a bed as soon as possible.  Patient is currently on oxybutynin  2.5 mg 3 times daily for overactive bladder.  This is working well for her.

## 2024-04-08 ENCOUNTER — Telehealth: Payer: Self-pay | Admitting: *Deleted

## 2024-04-08 NOTE — Progress Notes (Signed)
 Complex Care Management Note Care Guide Note  04/08/2024 Name: Sheryl Hall MRN: [id number] DOB: Nov 29, 1960  Sheryl Hall is a 63 y.o. year old female who is a primary care patient of Pickard, Cisco Crest, MD . The community resource team was consulted for assistance with Transportation Needs   SDOH screenings and interventions completed:  Yes     SDOH Interventions Today    Flowsheet Row Most Recent Value  SDOH Interventions   Transportation Interventions SCAT (Specialized Community Area Transporation), Payor Benefit, [employer] Provided  [employer] has medicaid transportation and is aware of how to access it as well as RCATS]        Care guide performed the following interventions: Patient provided with information about care guide support team and interviewed to confirm resource needs.  Follow Up Plan:  No further follow up planned at this time. The patient has been provided with needed resources.  Encounter Outcome:  Patient Visit Completed [place] Altice Greenauer[facility]an  [facility] HealthPopulation Health Care Guide  Direct Dial:[phone] Fax:[phone] Website: [email or web address]

## 2024-04-11 ENCOUNTER — Other Ambulatory Visit: Payer: Self-pay | Admitting: Family Medicine

## 2024-04-11 ENCOUNTER — Telehealth: Payer: Self-pay | Admitting: *Deleted

## 2024-04-11 DIAGNOSIS — G40209 Localization-related (focal) (partial) symptomatic epilepsy and epileptic syndromes with complex partial seizures, not intractable, without status epilepticus: Secondary | ICD-10-CM

## 2024-04-11 DIAGNOSIS — I639 Cerebral infarction, unspecified: Secondary | ICD-10-CM

## 2024-04-11 NOTE — Progress Notes (Signed)
 Complex Care Management Note  Care Guide Note 04/11/2024 Name: LASHAUN POCH MRN: [id number] DOB: [birth year]  FANNYE MYER is a 63 y.o. year old female who sees Pickard, Cisco Crest, MD for primary care. I reached out to Abelino Able by phone today to offer complex care management services.  Ms. Winbush was given information about Complex Care Management services today including:   The Complex Care Management services include support from the care team which includes your Nurse Care Manager, Clinical Social Worker, or Pharmacist.  The Complex Care Management team is here to help remove barriers to the health concerns and goals most important to you. Complex Care Management services are voluntary, and the patient may decline or stop services at any time by request to their care team member.   Complex Care Management Consent Status: Patient did not agree to participate in complex care management services at this time.  Follow up plan: None   Encounter Outcome:  Patient Refused Patient is actively working with Case management with [facility] health. No further outreach attempts will be made at this time.   Barnie Bora  [facility]  Value-Based Care Institute, [facility] Guide  Direct Dial: [phone]  Fax [phone]

## 2024-04-13 NOTE — Telephone Encounter (Signed)
 Requested Prescriptions  Pending Prescriptions Disp Refills   levETIRAcetam  (KEPPRA ) 1000 MG tablet [Pharmacy Med Name: levetiracetam  1,000 mg tablet] 60 tablet 3    Sig: Take 1 tablet (1,000 mg total) by mouth 2 (two) times daily.     Neurology:  Anticonvulsants - levetiracetam  Failed - 04/13/2024  9:54 AM      Failed - Cr in normal range and within 360 days    Creat  Date Value Ref Range Status  03/03/2024 1.03 0.50 - 1.05 mg/dL Final   Creatinine, Ser  Date Value Ref Range Status  03/23/2024 1.01 (H) 0.44 - 1.00 mg/dL Final   Creatinine,U  Date Value Ref Range Status  07/08/2010 111.9 mg/dL Final    Comment:    See lab report for associated comment(s)         Passed - Completed PHQ-2 or PHQ-9 in the last 360 days      Passed - Valid encounter within last 12 months    Recent Outpatient Visits           6 days ago Cerebrovascular accident (CVA), unspecified mechanism (HCC)   [place] [facility] Austine Lefort, MD   1 month ago Hypothyroidism, unspecified type   [place] [facility] Austine Lefort, MD   4 months ago Bipolar depression [facility])   [place] [facility] Austine Lefort, MD   5 months ago Muscle weakness   [place] [facility] Jenelle Mis, FNP   6 months ago Bipolar depression [facility])   [facility] Pickard, Cisco Crest, MD

## 2024-04-19 ENCOUNTER — Other Ambulatory Visit: Payer: Self-pay | Admitting: Family Medicine

## 2024-04-19 ENCOUNTER — Telehealth: Payer: Self-pay

## 2024-04-19 MED ORDER — PREGABALIN 50 MG PO CAPS: 50.0000 mg | ORAL_CAPSULE | Freq: Two times a day (BID) | ORAL | 3 refills | Status: DC

## 2024-04-19 NOTE — Telephone Encounter (Signed)
 Are you okay with doing this without an appointment? Last visit was 04/07/24. Thanks.   Copied from CRM [id number]. Topic: Clinical - Medication Question >> Apr 19, 2024  7:42 AM Juluis Ok wrote: Reason for CRM: Patient is requesting a dosage increase for Lyrica . She is wanting it increased to 100 mg a day.   [facility] [place], [place] - [id number] [place] [address] Phone: [phone] Fax: [phone] Hours: Not open 24 hours

## 2024-04-20 ENCOUNTER — Telehealth: Payer: Self-pay | Admitting: Family Medicine

## 2024-04-20 NOTE — Telephone Encounter (Signed)
 Copied from CRM [id number]. Topic: General - Other >> Apr 20, 2024  9:16 AM Jyl Or S wrote: Reason for CRM: Needs height and weight on form sent over   [phone] [facility]

## 2024-04-20 NOTE — Telephone Encounter (Signed)
 Contacted pharmacy, gave information of Ht. /wt. Amber the rep who answered the phone at the number [phone] stated she would update the information on the form.

## 2024-04-25 ENCOUNTER — Other Ambulatory Visit: Payer: Self-pay | Admitting: Family Medicine

## 2024-04-25 NOTE — Telephone Encounter (Signed)
 Copied from CRM [id number]. Topic: Clinical - Medication Refill >> Apr 25, 2024  8:23 AM Alysia Jumbo S wrote: Medication: oxybutynin  2.5 MG TABS  Has the patient contacted their pharmacy? No (Agent: If no, request that the patient contact the pharmacy for the refill. If patient does not wish to contact the pharmacy document the reason why and proceed with request.) (Agent: If yes, when and what did the pharmacy advise?)  This is the patient's preferred pharmacy:  [facility] [place], [place] - [id number] [place] [address] Phone: [phone] Fax: [phone]  Is this the correct pharmacy for this prescription? Yes If no, delete pharmacy and type the correct one.   Has the prescription been filled recently? No  Is the patient out of the medication? Yes  Has the patient been seen for an appointment in the last year OR does the patient have an upcoming appointment? Yes  Can we respond through MyChart? No  Agent: Please be advised that Rx refills may take up to 3 business days. We ask that you follow-up with your pharmacy.

## 2024-04-26 ENCOUNTER — Other Ambulatory Visit: Payer: Self-pay | Admitting: Family Medicine

## 2024-04-26 ENCOUNTER — Telehealth: Payer: Self-pay

## 2024-04-26 NOTE — Telephone Encounter (Signed)
 Hi Rhonda, I don't know if you can help me with this or not. I have no idea what an NCPASRR number is. Do you have any idea? If not, do you know who I can contact to find out? Thank you!  Copied from CRM [id number]. Topic: General - Other >> Apr 26, 2024 12:41 PM DeAngela L wrote: Reason for CRM: Payton calling with [facility] calling regarding the patients NCpasrr number  FL2 can't be over 108 days old, she is trying to get additional help with the Ncpasrr number cause they don't have access and usually the provider does this The login [place] Log they don't have access to log in and she needs additional help and would like to ask if Dr Shirline [place] office could call her back and help assist with this information and helping get the patient in to a new [place] [facility] re[facility] they have been working for a month since she was in the hospital to get her into there and still needs additional assistance  Payton phone number [phone] ok to leave detailed message

## 2024-04-26 NOTE — Telephone Encounter (Signed)
 Requested medication (s) are due for refill today: yes  Requested medication (s) are on the active medication list: yes  Last refill:  03/31/24 90  Future visit scheduled: no  Notes to clinic:  ordered by hospitalist at hospital discharge   Requested Prescriptions  Pending Prescriptions Disp Refills   oxyBUTYnin  Chloride 2.5 MG TABS 90 tablet 0    Sig: Take 2.5 mg by mouth every 8 (eight) hours as needed.     Urology:  Bladder Agents Passed - 04/26/2024 10:58 AM      Passed - Valid encounter within last 12 months    Recent Outpatient Visits           2 weeks ago Cerebrovascular accident (CVA), unspecified mechanism (HCC)   [facility] Pickard, Cisco Crest, MD   1 month ago Hypothyroidism, unspecified type   [place] [facility] Austine Lefort, MD   5 months ago Bipolar depression [facility])   [place] [facility] Austine Lefort, MD   6 months ago Muscle weakness   [place] [facility] Jenelle Mis, FNP   7 months ago Bipolar depression [facility])    [facility] Pickard, Cisco Crest, MD

## 2024-04-26 NOTE — Telephone Encounter (Unsigned)
 Copied from CRM [id number]. Topic: Clinical - Medication Refill >> Apr 26, 2024 12:28 PM Lynnie Saucier S wrote: Medication: oxybutynin  2.5 MG TABS  Has the patient contacted their pharmacy? Yes (Agent: If no, request that the patient contact the pharmacy for the refill. If patient does not wish to contact the pharmacy document the reason why and proceed with request.) (Agent: If yes, when and what did the pharmacy advise?)  This is the patient's preferred pharmacy:  [facility] [place], [place] - [id number] [place] [address] Phone: [phone] Fax: [phone]  Is this the correct pharmacy for this prescription? Yes If no, delete pharmacy and type the correct one.   Has the prescription been filled recently? No  Is the patient out of the medication? Yes  Has the patient been seen for an appointment in the last year OR does the patient have an upcoming appointment? Yes  Can we respond through MyChart? Yes  Agent: Please be advised that Rx refills may take up to 3 business days. We ask that you follow-up with your pharmacy.

## 2024-04-27 ENCOUNTER — Telehealth: Payer: Self-pay

## 2024-04-27 NOTE — Telephone Encounter (Signed)
 Requested medication (s) are due for refill today: yes  Requested medication (s) are on the active medication list: yes  Last refill:  03/31/24  Future visit scheduled: no  Notes to clinic:  Unable to refill per protocol, last refill by another/ ED provider.      Requested Prescriptions  Pending Prescriptions Disp Refills   oxyBUTYnin  Chloride 2.5 MG TABS 90 tablet 0    Sig: Take 2.5 mg by mouth every 8 (eight) hours as needed.     Urology:  Bladder Agents Passed - 04/27/2024  2:23 PM      Passed - Valid encounter within last 12 months    Recent Outpatient Visits           2 weeks ago Cerebrovascular accident (CVA), unspecified mechanism (HCC)   [place] [facility] Pickard, Cisco Crest, MD   1 month ago Hypothyroidism, unspecified type   [facility] Austine Lefort, MD   5 months ago Bipolar depression [facility])   [place] [facility] Austine Lefort, MD   6 months ago Muscle weakness   [facility] Jenelle Mis, FNP   7 months ago Bipolar depression [facility])   [place] [facility] Medicine Pickard, Cisco Crest, MD

## 2024-04-27 NOTE — Telephone Encounter (Signed)
 Addressed in separate TE from today, 04/27/24. Mjp,lpn   Copied from CRM [id number]. Topic: Medical Record Request - Provider/Facility Request >> Apr 27, 2024 10:42 AM Rosaria Common wrote: Reason for CRM: Payton from [facility] calling to gain an updated FL2 form to assist with transitioning pt to a skilled nursing facility. Callback number is [phone].

## 2024-04-27 NOTE — Telephone Encounter (Signed)
 Spoke with Sheryl Hall at [facility]. Pt is in need of an updated FL2 form as her's expired on 04/23/24. Placed in green folder.  Patient also needs a new NCPASRR number. Sheryl Hall advised that this is usually done when the patient is in the hospital by social work but it has expired also. Sheryl Hall asks if that is something you can do? If not she can have the [facility] in [place] complete it. Thank you.

## 2024-04-28 ENCOUNTER — Other Ambulatory Visit: Payer: Self-pay | Admitting: Family Medicine

## 2024-04-28 DIAGNOSIS — K219 Gastro-esophageal reflux disease without esophagitis: Secondary | ICD-10-CM

## 2024-04-28 DIAGNOSIS — K581 Irritable bowel syndrome with constipation: Secondary | ICD-10-CM

## 2024-04-28 NOTE — Telephone Encounter (Signed)
 Copied from CRM [id number]. Topic: Clinical - Medication Refill >> Apr 28, 2024  3:26 PM Bearl Botts E wrote: Medication: omeprazole  (PRILOSEC) 40 MG capsule  Pt says that she received a refill of this from [facility] pharmacy with only 30 capsules with the instructions to take one a day.   Pt needs 60 capsules with twice a day per last written prescription.   Also needs oxyBUTYnin  Chloride 2.5 MG TABS says she will run out by Friday 04/29/2024.  Has the patient contacted their pharmacy? Yes (Agent: If no, request that the patient contact the pharmacy for the refill. If patient does not wish to contact the pharmacy document the reason why and proceed with request.) (Agent: If yes, when and what did the pharmacy advise?)  This is the patient's preferred pharmacy:  [facility] [place], [place] - [id number] [place] [address] Phone: [phone] Fax: [phone]  Is this the correct pharmacy for this prescription? Yes If no, delete pharmacy and type the correct one.   Has the prescription been filled recently? Yes  Is the patient out of the medication? No  Has the patient been seen for an appointment in the last year OR does the patient have an upcoming appointment? Yes  Can we respond through MyChart? Yes  Agent: Please be advised that Rx refills may take up to 3 business days. We ask that you follow-up with your pharmacy.

## 2024-04-29 ENCOUNTER — Telehealth: Payer: Self-pay

## 2024-04-29 ENCOUNTER — Encounter: Payer: Self-pay | Admitting: Family Medicine

## 2024-04-29 ENCOUNTER — Other Ambulatory Visit: Payer: Self-pay

## 2024-04-29 MED ORDER — OMEPRAZOLE 40 MG PO CPDR: 40.0000 mg | DELAYED_RELEASE_CAPSULE | Freq: Two times a day (BID) | ORAL | 0 refills | Status: DC

## 2024-04-29 MED ORDER — OXYBUTYNIN CHLORIDE 2.5 MG PO TABS: 2.5000 mg | ORAL_TABLET | Freq: Three times a day (TID) | ORAL | 0 refills | Status: DC | PRN

## 2024-04-29 NOTE — Telephone Encounter (Signed)
 Requested Prescriptions  Pending Prescriptions Disp Refills   omeprazole  (PRILOSEC) 40 MG capsule 180 capsule 0    Sig: Take 1 capsule (40 mg total) by mouth in the morning and at bedtime.     Gastroenterology: Proton Pump Inhibitors Passed - 04/29/2024  1:53 PM      Passed - Valid encounter within last 12 months    Recent Outpatient Visits           3 weeks ago Cerebrovascular accident (CVA), unspecified mechanism (HCC)   [place] [facility] Pickard, Cisco Crest, MD   1 month ago Hypothyroidism, unspecified type   [place] [facility] Medicine Austine Lefort, MD   5 months ago Bipolar depression [facility])   [place] [facility] Medicine Austine Lefort, MD   6 months ago Muscle weakness   [place] [facility] Jenelle Mis, FNP   7 months ago Bipolar depression [facility])    [facility] Pickard, Cisco Crest, MD

## 2024-04-29 NOTE — Telephone Encounter (Signed)
 Copied from CRM [id number]. Topic: Medical Record Request - Provider/Facility Request >> Apr 27, 2024 10:42 AM Rosaria Common wrote: Reason for CRM: Payton from [facility] calling to gain an updated FL2 form to assist with transitioning pt to a skilled nursing facility. Callback number is [phone]. >> Apr 29, 2024  8:31 AM Emylou G wrote: Lonni Robert.. still checking status of FL2.. see original msg?  Did we fax yet?

## 2024-04-29 NOTE — Telephone Encounter (Signed)
 FL2 faxed to Payton at [facility], [phone]. Mjp,lpn

## 2024-04-29 NOTE — Telephone Encounter (Signed)
 Copied from CRM [id number]. Topic: Clinical - Medication Question >> Apr 29, 2024 10:47 AM DeAngela L wrote: Reason for CRM: Patient calling to ask if Dr Cheril Cork will fill her prescription for oxyBUTYnin  Chloride 2.5 MG TABS  Patient has mail delivery pharmacy and if she doesn't get the prescription put in by today she won't have it delivered in time, the patient anxiety gets high just worrying about things, The patient has an aide who comes to help her and she is in a wheel chair and not able to get the medication from the pharmacy Since the patient is out of medication and concerned about her bladder spasms Pt number [phone] (M) (Patient would like a call when the prescription is called in so her aide can pick up her prescription)

## 2024-05-02 ENCOUNTER — Telehealth: Payer: Self-pay

## 2024-05-02 NOTE — Telephone Encounter (Signed)
 Copied from CRM [id number]. Topic: General - Other >> May 02, 2024 11:19 AM Emylou G wrote: Reason for CRM: patient called.. said oxyBUTYnin  Chloride 2.5 MG TABS isn't working as supposed to.. still has to go to bathroom.. like not strong enough?  Please advise

## 2024-05-03 ENCOUNTER — Other Ambulatory Visit: Payer: Self-pay

## 2024-05-03 ENCOUNTER — Telehealth: Payer: Self-pay

## 2024-05-03 MED ORDER — OXYBUTYNIN CHLORIDE 5 MG PO TABS: 5.0000 mg | ORAL_TABLET | Freq: Three times a day (TID) | ORAL | 0 refills | Status: DC | PRN

## 2024-05-03 NOTE — Telephone Encounter (Signed)
 Copied from CRM [id number]. Topic: Clinical - Prescription Issue >> May 03, 2024 10:01 AM Georgeann Kindred wrote: Reason for CRM: Patient returning Calden Dorsey's call. Read patient the message and she had additional question, however, she stated she would do it. Patient want to speak with Marlin Simmonds, I reached out to CAL and she will call the patient back. >> May 03, 2024 10:47 AM Corin V wrote: Patient called back to ensure that call is made from the front office and transfer to the nurse since calls directly from the nurse do not go through to her phone.

## 2024-05-06 ENCOUNTER — Telehealth: Payer: Self-pay

## 2024-05-06 NOTE — Telephone Encounter (Signed)
 Copied from CRM [id number]. Topic: General - Call Back - No Documentation >> May 05, 2024 12:07 PM Alpha Arts wrote: Reason for CRM: Payton from [facility] is requesting a call from Verneda Golder, LPN in regards to getting patient into a skilled nursing facility.  Callback #[phone]

## 2024-05-10 NOTE — Telephone Encounter (Signed)
 Copied from CRM [id number]. Topic: General - Other >> May 09, 2024  2:37 PM Star East wrote: Reason for CRM: Payton with [facility] health calling Marlin Simmonds back- [place] on phone, will call back >> May 10, 2024  1:43 PM Alica Antu wrote: Babs Less calling back to speak with care team sheena or collen.  Call back #: [phone]  May you please assist

## 2024-05-13 ENCOUNTER — Telehealth: Payer: Self-pay

## 2024-05-13 NOTE — Telephone Encounter (Signed)
 Copied from CRM [id number]. Topic: General - Other >> May 13, 2024  8:59 AM Elle L wrote: Reason for CRM: Payton, Nurse Care Manager with [facility], was requesting to speak to [facility] regarding a FL2 form. I reached out to the office and was advised that Carron Clap and Gailene Youkhana were assisting patients other patients at this time. Payton advised that this is time sensitive and her call back number is [phone].

## 2024-05-13 NOTE — Telephone Encounter (Signed)
 Spoke to Sheryl Hall, informed her we have faxed in the form several times. However she has not received the forms as of yet. She asked for the form to be emailed to her at [email or web address] form has already been sent to the scan center. Office is awaiting for form to be scanned to the chart for readmittance.

## 2024-05-16 ENCOUNTER — Telehealth: Payer: Self-pay

## 2024-05-16 NOTE — Telephone Encounter (Signed)
 Copied from CRM [id number]. Topic: General - Other >> May 13, 2024  4:27 PM Fonda T wrote: Reason for CRM: Gabriella, Nurse Manager with [facility] calling to request to speak to clinical staff regarding a time sensitive previous call and FL2 form.  Called and spoke to front office, Nannette, call warm transferred to office. >> May 16, 2024 11:50 AM Donna BRAVO wrote: Payton, Nurse Manager with [facility] [phone]. Gabriella  states she has not received the email from [place] regarding the FL2 form.  Payton has access to [employer] EMR, if form is uploaded to patient chart, Payton would be able to access the form medical records.  The the [facility] form that was uploaded on 05/04/24 is incorrect. Payton would like the corrected FL2 form uploaded to patient chart. The form that Sheena worked on 05/06/24 is the one that needs uploaded.    Payton would like to speak with Jesusa Gabriella phone     [phone]   [email or web address]

## 2024-05-16 NOTE — Telephone Encounter (Signed)
 Copied from CRM [id number]. Topic: General - Other >> May 13, 2024  4:27 PM Fonda T wrote: Reason for CRM: Gabriella, Nurse Manager with [facility] calling to request to speak to clinical staff regarding a time sensitive previous call and FL2 form.  Called and spoke to front office, Nannette, call warm transferred to office. >> May 16, 2024  4:08 PM Delon DASEN wrote: Gabriella calling again about FL2 form- if possible would like it by email >> May 16, 2024 11:50 AM Donna BRAVO wrote: Payton, Nurse Manager with [facility] [phone]. Gabriella  states she has not received the email from [place] regarding the FL2 form.  Payton has access to [employer] EMR, if form is uploaded to patient chart, Payton would be able to access the form medical records.  The the [facility] form that was uploaded on 05/04/24 is incorrect. Payton would like the corrected FL2 form uploaded to patient chart. The form that Sheena worked on 05/06/24 is the one that needs uploaded.    Payton would like to speak with Jesusa Gabriella phone     [phone]   [email or web address]

## 2024-05-17 NOTE — Telephone Encounter (Signed)
 Completed for from 05/06/24 has been sent to the scan center and we are waiting for the scan to the chart to have current access.

## 2024-05-17 NOTE — Telephone Encounter (Signed)
 Copied from CRM [id number]. Topic: General - Other >> May 13, 2024  4:27 PM Fonda T wrote: Reason for CRM: Gabriella, Nurse Manager with [facility] calling to request to speak to clinical staff regarding a time sensitive previous call and FL2 form.  Called and spoke to front office, Nannette, call warm transferred to office.

## 2024-06-01 ENCOUNTER — Telehealth: Payer: Self-pay

## 2024-06-01 ENCOUNTER — Other Ambulatory Visit: Payer: Self-pay | Admitting: Family Medicine

## 2024-06-01 NOTE — Telephone Encounter (Unsigned)
 Copied from CRM [id number]. Topic: Clinical - Medication Refill >> Jun 01, 2024  9:41 AM Carlatta H wrote: Medication: atorvastatin  (LIPITOR) 40 MG tablet  Has the patient contacted their pharmacy? No (Agent: If no, request that the patient contact the pharmacy for the refill. If patient does not wish to contact the pharmacy document the reason why and proceed with request.) (Agent: If yes, when and what did the pharmacy advise?)  This is the patient's preferred pharmacy:  [facility] [place], [place] - [id number] [place] [address] Phone: [phone] Fax: [phone]  Is this the correct pharmacy for this prescription? Yes If no, delete pharmacy and type the correct one.   Has the prescription been filled recently? No  Is the patient out of the medication? Yes  Has the patient been seen for an appointment in the last year OR does the patient have an upcoming appointment? No  Can we respond through MyChart? Yes  Agent: Please be advised that Rx refills may take up to 3 business days. We ask that you follow-up with your pharmacy.

## 2024-06-01 NOTE — Telephone Encounter (Signed)
 Copied from CRM [id number]. Topic: Clinical - Order For Equipment >> Jun 01, 2024 11:58 AM Avram MATSU wrote: Reason for CRM: patient is calling for incontinence supplies from [facility]. It was was already approved but needs a rx sent in to [facility].   [phone]

## 2024-06-01 NOTE — Telephone Encounter (Signed)
 Copied from CRM [id number]. Topic: Clinical - Medical Advice >> Jun 01, 2024  9:43 AM Carlatta H wrote: Reason for CRM: patient would like a call back regarding not taking atorvastatin  (LIPITOR) 40 MG tablet medication and any effects it would have on levels

## 2024-06-02 ENCOUNTER — Ambulatory Visit: Payer: Self-pay

## 2024-06-02 NOTE — Telephone Encounter (Signed)
 FYI Only or Action Required?: Action required by provider: medication refill request. Med pended 07/09  Patient was last seen in primary care on 04/07/2024 by [name], MD.  Called Nurse Triage reporting Headache.  Symptoms began several days ago. Started 3 days ago, pt says that she has not been sleeping well. Pt also says that she has been out of Lipitor 4 days ago  Interventions attempted: Rest, hydration, or home remedies.  Symptoms are: gradually worsening. Care advice given per protocl  Triage Disposition: See Physician Within 24 Hours- Unable to come in-person, opted for Virtual Visit  Patient/caregiver understands and will follow disposition?: Yes- Both Patient and HH aide verbalized understanding         Copied from CRM [id number]. Topic: Clinical - Red Word Triage >> Jun 02, 2024 11:11 AM Avram MATSU wrote: Red Word that prompted transfer to Nurse Triage: headache and neck pain Reason for Disposition  [1] MODERATE headache (e.g., interferes with normal activities) AND [2] present > 24 hours AND [3] unexplained  (Exceptions: Pain medicines not tried, typical migraine, or headache part of viral illness.)  Answer Assessment - Initial Assessment Questions 1. LOCATION: Where does it hurt?      Back of head and neck and then forehead  2. ONSET: When did the headache start? (e.g., minutes, hours, days)      3 days  3. PATTERN: Does the pain come and go, or has it been constant since it started?     Constant  4. SEVERITY: How bad is the pain? and What does it keep you from doing?  (e.g., Scale 1-10; mild, moderate, or severe)     7/10 pain  5. RECURRENT SYMPTOM: Have you ever had headaches before? If Yes, ask: When was the last time? and What happened that time?      Had  a headache before and eventually had a stroke  6. CAUSE: What do you think is causing the headache?     Has been out of Lipitor since Sunday  7. MIGRAINE: Have you been  diagnosed with migraine headaches? If Yes, ask: Is this headache similar?      No  8. HEAD INJURY: Has there been any recent injury to your head?      No  9. OTHER SYMPTOMS: Do you have any other symptoms? (e.g., fever, stiff neck, eye pain, sore throat, cold symptoms)     Gets light headed when standing, swimmy headed  10. PREGNANCY: Is there any chance you are pregnant? When was your last menstrual period?       No  Protocols used: Headache-A-AH

## 2024-06-02 NOTE — Telephone Encounter (Signed)
Placed in providers folder to sign

## 2024-06-02 NOTE — Telephone Encounter (Signed)
 This encounter was created in error - please disregard.

## 2024-06-03 ENCOUNTER — Telehealth: Payer: MEDICAID | Admitting: Family Medicine

## 2024-06-03 DIAGNOSIS — G44201 Tension-type headache, unspecified, intractable: Secondary | ICD-10-CM | POA: Diagnosis not present

## 2024-06-03 MED ORDER — ATORVASTATIN CALCIUM 40 MG PO TABS: 40.0000 mg | ORAL_TABLET | Freq: Every day | ORAL | 1 refills | Status: AC

## 2024-06-03 NOTE — Telephone Encounter (Signed)
 Refused atorvastatin  because this is a duplicate request.   Order sent in earlier today 06/03/2024.

## 2024-06-03 NOTE — Progress Notes (Signed)
 Virtual Visit via Video Note  Assessment and Plan: Acute intractable tension-type headache  Follow Up Instructions: With Dr. Duanne. Advised to go to ED No follow-ups on file.   I discussed the assessment and treatment plan with the patient. The patient was provided an opportunity to ask questions, and all were answered. The patient agreed with the plan and demonstrated an understanding of the instructions.   The patient was advised to call back or seek an in-person evaluation if the symptoms worsen or if the condition fails to improve as anticipated.  The above assessment and management plan was discussed with the patient. The patient verbalized understanding of and has agreed to the management plan.   Sheryl Emperor, MD  I connected with [name] on 06/03/24 at 10:20 AM EDT by a video enabled telemedicine application and verified that I am speaking with the correct person using two identifiers.  Patient Location: Home Provider Location: Home Office  I discussed the limitations, risks, security, and privacy concerns of performing an evaluation and management service by video and the availability of in person appointments. I also discussed with the patient that there may be a patient responsible charge related to this service. The patient expressed understanding and agreed to proceed.  Subjective: PCP: [name], MD  No chief complaint on file. Sheryl Hall is a 63 year old female with a history of strokes who presents with a persistent frontal headache the past 3 days.  She has been experiencing a severe headache for the past three days, starting on Wednesday. The headache is persistent, located at the front of her head, and tends to lessen when she lies down and closes her eyes but returns shortly after. No history of migraines, nausea, or vomiting. She experiences some blurry vision but no diplopia or vision loss. She rates the headache as not being a high  level of pain.  She has a history of two strokes, the most recent occurring a about 2 months ago. She is currently taking Lipitor 40 mg, which she started after her first stroke, but has missed taking it for the past five days. She initially thought the headache might be related to this, not taking this medication. She also takes Keppra  for seizures, which she has not experienced in over two years, and Lamictal  for a mood disorder. She reports a rash, which she attributes to Lamictal  and psychiatry is aware of this.   She has been in a wheelchair for a year due to weakness from a B12 deficiency and stroke. Her left leg drags slightly since her last stroke, but this has not worsened. She lives alone and usually relies on her aide for transportation to medical appointments. She is supposed to start inpatient physical therapy soon. Physical Exam Results Assessment & Plan Headache Severe frontal headache with blurry vision, concerning due to stroke history. - Recommend ER evaluation due to stroke history and persistent headache. - Advise calling EMS for hospital transportation.  Previous Strokes Persistent left-sided weakness post-stroke, wheelchair-bound due to weakness from B12 deficiency and stroke. - Continue with physical therapy. - Follow up with neurologist Dr. Vear in [place].  Seizure Disorder Well-controlled on Keppra  with no seizures in over two years.  Mood Disorder On Lamictal .  Hyperlipidemia On atorvastatin  40 mg, missed doses unlikely cause of headache. - Prescribe atorvastatin  40 mg for 90 days and send to [facility].  General Health Maintenance Due for follow-up and monitoring of chronic conditions. - Ensure follow-up  with primary care provider, Dr. Duanne, after completing physical therapy Recommend she go to ED   ROS: Per HPI  Current Outpatient Medications:    ARIPiprazole  (ABILIFY ) 2 MG tablet, Take 1 tablet (2 mg total) by mouth at bedtime., Disp: 30  tablet, Rfl: 1   aspirin  EC 81 MG tablet, Take 1 tablet (81 mg total) by mouth daily. Swallow whole., Disp: , Rfl:    atorvastatin  (LIPITOR) 40 MG tablet, Take 1 tablet (40 mg total) by mouth daily., Disp: 90 tablet, Rfl: 1   escitalopram  (LEXAPRO ) 10 MG tablet, Take 1 tablet (10 mg total) by mouth daily., Disp: 30 tablet, Rfl: 1   lamoTRIgine  (LAMICTAL ) 25 MG tablet, Take 1 tablet (25 mg total) by mouth 2 (two) times daily., Disp: 60 tablet, Rfl: 1   levETIRAcetam  (KEPPRA ) 1000 MG tablet, Take 1 tablet (1,000 mg total) by mouth 2 (two) times daily., Disp: 60 tablet, Rfl: 3   levothyroxine  (SYNTHROID ) 50 MCG tablet, TAKE ONE TABLET BY MOUTH EVERY DAY., Disp: 90 tablet, Rfl: 3   melatonin 3 MG TABS tablet, Take 1 tablet (3 mg total) by mouth at bedtime., Disp: , Rfl:    omeprazole  (PRILOSEC) 40 MG capsule, Take 1 capsule (40 mg total) by mouth in the morning and at bedtime., Disp: 180 capsule, Rfl: 0   oxybutynin  (DITROPAN ) 5 MG tablet, Take 1 tablet (5 mg total) by mouth every 8 (eight) hours as needed for bladder spasms., Disp: 90 tablet, Rfl: 0   oxyBUTYnin  Chloride 2.5 MG TABS, Take 2.5 mg by mouth every 8 (eight) hours as needed., Disp: 90 tablet, Rfl: 0   polyethylene glycol (MIRALAX  / GLYCOLAX ) 17 g packet, Take 17 g by mouth daily as needed for mild constipation., Disp: 30 each, Rfl: 0   pregabalin  (LYRICA ) 50 MG capsule, Take 1 capsule (50 mg total) by mouth 2 (two) times daily., Disp: 60 capsule, Rfl: 3  Social History   Socioeconomic History   Marital status: Divorced    Spouse name: Not on file   Number of children: 1   Years of education: Not on file   Highest education level: Some college, no degree  Occupational History   Occupation: disabled  Tobacco Use   Smoking status: Former    Current packs/day: 0.00    Average packs/day: 0.5 packs/day for 30.0 years (15.0 ttl pk-yrs)    Types: Cigarettes    Start date: 04/24/1992    Quit date: 04/24/2022    Years since quitting: 2.1    Smokeless tobacco: Never   Tobacco comments:    trying to quit  Vaping Use   Vaping status: Never Used  Substance and Sexual Activity   Alcohol use: No    Alcohol/week: 0.0 standard drinks of alcohol   Drug use: No    Comment: hx cocaine abuse   Sexual activity: Never    Birth control/protection: None  Other Topics Concern   Not on file  Social History Narrative   Patient lives at home alone and she is single.    Disabled.   Education college education.   Right handed.   Caffeine mountain dew four daily.    Social Drivers of Corporate investment banker Strain: Low Risk  (06/03/2024)   Overall Financial Resource Strain (CARDIA)    Difficulty of Paying Living Expenses: Not hard at all  Food Insecurity: No Food Insecurity (06/03/2024)   Hunger Vital Sign    Worried About Running Out of Food in the Last Year:  Never true    Ran Out of Food in the Last Year: Never true  Transportation Needs: Unmet Transportation Needs (06/03/2024)   PRAPARE - Transportation    Lack of Transportation (Medical): Yes    Lack of Transportation (Non-Medical): Yes  Physical Activity: Inactive (06/03/2024)   Exercise Vital Sign    Days of Exercise per Week: 0 days    Minutes of Exercise per Session: Not on file  Stress: Stress Concern Present (06/03/2024)   [employer] of Occupational Health - Occupational Stress Questionnaire    Feeling of Stress: Very much  Social Connections: Socially Isolated (06/03/2024)   Social Connection and Isolation Panel    Frequency of Communication with Friends and Family: More than three times a week    Frequency of Social Gatherings with Friends and Family: Once a week    Attends Religious Services: Never    Database administrator or Organizations: No    Attends Engineer, structural: Not on file    Marital Status: Divorced  Intimate Partner Violence: Not At Risk (03/23/2024)   Humiliation, Afraid, Rape, and Kick questionnaire    Fear of Current or  Ex-Partner: No    Emotionally Abused: No    Physically Abused: No    Sexually Abused: No     Observations/Objective: There were no vitals filed for this visit. Physical Exam Constitutional:      General: She is not in acute distress.    Appearance: Normal appearance.  HENT:     Head: Normocephalic.  Neurological:     Mental Status: She is alert.  Psychiatric:        Mood and Affect: Mood normal.        Behavior: Behavior normal.

## 2024-06-03 NOTE — Telephone Encounter (Signed)
 Requested medications are due for refill today.  yes  Requested medications are on the active medications list.  yes  Last refill. 04/01/2024 #30 1 rf  Future visit scheduled.   no  Notes to clinic.  Rx is signed by Dr. Afton Louder.    Requested Prescriptions  Pending Prescriptions Disp Refills   atorvastatin  (LIPITOR) 40 MG tablet 30 tablet 1    Sig: Take 1 tablet (40 mg total) by mouth daily.     Cardiovascular:  Antilipid - Statins Failed - 06/03/2024 10:37 AM      Failed - Lipid Panel in normal range within the last 12 months    Cholesterol  Date Value Ref Range Status  03/23/2024 198 0 - 200 mg/dL Final   LDL Cholesterol (Calc)  Date Value Ref Range Status  06/05/2023 79 mg/dL (calc) Final    Comment:    Reference range: <100 . Desirable range <100 mg/dL for primary prevention;   <70 mg/dL for patients with CHD or diabetic patients  with > or = 2 CHD risk factors. [name] is now calculated using the Martin-Hopkins  calculation, which is a validated novel method providing  better accuracy than the Friedewald equation in the  estimation of LDL-C.  [name] et al. [name]. 7986;689(80): 2061-2068  (http://education.[facility].com/faq/FAQ164)    LDL Cholesterol  Date Value Ref Range Status  03/23/2024 136 (H) 0 - 99 mg/dL Final    Comment:           Total Cholesterol/HDL:CHD Risk Coronary Heart Disease Risk Table                     Men   Women  1/2 Average Risk   3.4   3.3  Average Risk       5.0   4.4  2 X Average Risk   9.6   7.1  3 X Average Risk  23.4   11.0        Use the calculated Patient Ratio above and the CHD Risk Table to determine the patient's CHD Risk.        ATP III CLASSIFICATION (LDL):  <100     mg/dL   Optimal  899-870  mg/dL   Near or Above                    Optimal  130-159  mg/dL   Borderline  839-810  mg/dL   High  >809     mg/dL   Very High Performed at [facility], [address] [place], [place] [address]    HDL   Date Value Ref Range Status  03/23/2024 48 >40 mg/dL Final   Triglycerides  Date Value Ref Range Status  03/23/2024 70 <150 mg/dL Final         Passed - Patient is not pregnant      Passed - Valid encounter within last 12 months    Recent Outpatient Visits           1 month ago Cerebrovascular accident (CVA), unspecified mechanism (HCC)   [place] [facility] [name], MD   3 months ago Hypothyroidism, unspecified type   [place] [facility] [facility] [name], MD   6 months ago Bipolar depression [facility])   [place] [facility] [name], MD   7 months ago Muscle weakness   [place] [facility] Kayla Jeoffrey RAMAN, [place]  8 months ago Bipolar depression [facility])   [place] [facility] [place], Butler DASEN, MD

## 2024-06-07 DIAGNOSIS — R2681 Unsteadiness on feet: Secondary | ICD-10-CM | POA: Insufficient documentation

## 2024-06-07 DIAGNOSIS — I10 Essential (primary) hypertension: Secondary | ICD-10-CM | POA: Insufficient documentation

## 2024-06-07 DIAGNOSIS — F310 Bipolar disorder, current episode hypomanic: Secondary | ICD-10-CM | POA: Insufficient documentation

## 2024-06-07 DIAGNOSIS — N3281 Overactive bladder: Secondary | ICD-10-CM | POA: Insufficient documentation

## 2024-06-07 DIAGNOSIS — I2510 Atherosclerotic heart disease of native coronary artery without angina pectoris: Secondary | ICD-10-CM | POA: Insufficient documentation

## 2024-06-07 DIAGNOSIS — G4700 Insomnia, unspecified: Secondary | ICD-10-CM | POA: Insufficient documentation

## 2024-06-07 DIAGNOSIS — R569 Unspecified convulsions: Secondary | ICD-10-CM | POA: Insufficient documentation

## 2024-06-07 DIAGNOSIS — R262 Difficulty in walking, not elsewhere classified: Secondary | ICD-10-CM | POA: Insufficient documentation

## 2024-06-07 DIAGNOSIS — F331 Major depressive disorder, recurrent, moderate: Secondary | ICD-10-CM | POA: Insufficient documentation

## 2024-06-14 ENCOUNTER — Telehealth: Payer: Self-pay | Admitting: Neurology

## 2024-06-14 NOTE — Telephone Encounter (Signed)
 Mallie, RN w/ [facility] is asking if pt should be still taking the lamoTRIgine  (LAMICTAL ) 25 MG tablet with levETIRAcetam  (KEPPRA ) 1000 MG tablet

## 2024-06-14 NOTE — Telephone Encounter (Signed)
 Called back at [phone]. Spoke w/ Izetta, RN. Pt last seen 05/19/23. Has no f/u. Would need appt. Scheduled appt for 06/21/24 at 11:00am.   They are wondering if pt should be on both Keppra  1000mg  po BID and Lamictal . She is on Keppra  for seizures and lamictal  for bipolar disorder. She d/c'd lamictal  d/t SE: rash when admitted on 06/07/24. They will discuss other med options w/ psychiatry. She was admitted for polyneuropathy/weakness. Doing rehab/PT currently. Unsure of d/c date.  Nothing further needed at this time. Will plan to see her for f/u here at office.

## 2024-06-20 ENCOUNTER — Telehealth: Payer: Self-pay | Admitting: Neurology

## 2024-06-20 NOTE — Telephone Encounter (Signed)
 [facility] conf

## 2024-06-21 ENCOUNTER — Ambulatory Visit (INDEPENDENT_AMBULATORY_CARE_PROVIDER_SITE_OTHER): Payer: MEDICAID | Admitting: Neurology

## 2024-06-21 ENCOUNTER — Encounter: Payer: Self-pay | Admitting: Neurology

## 2024-06-21 VITALS — BP 129/84 | HR 73 | Ht 66.0 in | Wt 194.0 lb

## 2024-06-21 DIAGNOSIS — Z8673 Personal history of transient ischemic attack (TIA), and cerebral infarction without residual deficits: Secondary | ICD-10-CM

## 2024-06-21 DIAGNOSIS — R269 Unspecified abnormalities of gait and mobility: Secondary | ICD-10-CM | POA: Diagnosis not present

## 2024-06-21 DIAGNOSIS — G629 Polyneuropathy, unspecified: Secondary | ICD-10-CM | POA: Diagnosis not present

## 2024-06-21 DIAGNOSIS — G40209 Localization-related (focal) (partial) symptomatic epilepsy and epileptic syndromes with complex partial seizures, not intractable, without status epilepticus: Secondary | ICD-10-CM | POA: Diagnosis not present

## 2024-06-21 MED ORDER — CYANOCOBALAMIN 1000 MCG/ML IJ SOLN
1000.0000 ug | Freq: Once | INTRAMUSCULAR | Status: AC
  Administered 2024-06-21: 1000 ug via INTRAMUSCULAR

## 2024-06-21 MED ORDER — CYANOCOBALAMIN 1000 MCG/ML IJ SOLN: INTRAMUSCULAR | 4 refills | Status: AC

## 2024-06-21 NOTE — Progress Notes (Signed)
 [facility]  PATIENT: Sheryl Hall DOB: [birth year]  REFERRING DOCTOR OR PCP: Butler Burr, MD SOURCE: Patient, notes from neurology, imaging and lab reports, additional study reports.  Imaging studies personally reviewed.    _________________________________   HISTORICAL  CHIEF COMPLAINT:  Chief Complaint  Patient presents with   Follow-up    Pt in room 11. [place] transportation in room. Here for seizure/ stoke follow up. Pt currently Rehab department. No recent falls. Left side weakness ( worse side).     HISTORY OF PRESENT ILLNESS:  Sheryl Hall is a 63 year old woman with a history of stroke and seizures  Update 06/21/2024: I had seen her in June 2020 for due to gait disturbance over the preceding few months.  I felt that this was likely due to B12 deficiency.  We also checked MRI of the brain and spinal cord and so were no acute findings.  There was no spinal cord pathology or spinal stenosis noted  More recently she noted some worsening.   She woke up 03/23/24 with weakness in her legs and was taken to the ED.    She had CT and MRI showing her old CVAs but no acute changes.    She does not feels she has improved any.  Of notes she was already mostly wheelchair bound but can walk short distance with a walker (20 feet ).  She assists with transfers.     MRI Brain 03/23/2024 showed  MPRESSION: No acute finding. 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  CTA 03/23/2024 showed IMPRESSION: 1. Negative CT Perfusion. And CTA is negative for large vessel occlusion. 2. Minimal atherosclerosis in the head and neck. 3. Aortic Atherosclerosis (ICD10-I70.0). 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 4. Prior cervical ACDF with evidence of arthrodesis.  I had seen her 04/2023 due to gradual onset of weakness in  her legs over the preceding 3 months involving the left leg more than the right.  She had gone from walking well a couple years earlier to using a walker in early 2024 to being wheelchair dependent by mid 2024.  She also noted mild slurred speech.  She noted no arm weakness.   She denied numbness or change in bladder.     She was admitted to [facility] April 2024 and was found to have low B12.  TSH, ESR and CRP were fine.  She reports having an MRI of the brain but I saw no record of any MRI or CT scan.  She has been in rehab for the past 7 weeks.  When I saw her 04/2023 - we checked B12 (back in normal range), copper , SPEP/IEF, antiHu/Yo and all were normal.     The second history is her history of seizures.  She has no recent seizures since being placed on Keppra  1000 mg twice a day   History of stroke (2014) In February 2014, she presented to the emergency room with a transient confusional episode.  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  Carotid Doppler showed no significant stenosis.  She saw Dr. Jenel as an outpatient.  He was concerned about seizure and ordered an EEG.  It was abnormal showing bihemispheric dysrhythmic theta activity.  She was started on levetiracetam .  Weight was 195 and now 150 pounds    Imaging:  MRI of the  brain 03/23/2024 showed the old right parietal stroke but no new findings.  MRI of the cervical and thoracic spine 08/14/2023 showed a normal spinal cord and no spinal stenosis.  She had mild degenerative changes.  09/28/2017 MRI shows encephalomalacia in the right occipital temporal junction consistent with a remote infarction.  This can be seen going back on CT scans to 2014.  MR angiogram 04/06/2013 was normal  Carotid ultrasound 01/07/2013 showed no significant stenosis though there was retrograde flow of the right vertebral artery  EEG 03/24/2013 showed bihemispheric dysrhythmic theta activity. This study suggests  a lowered seizure threshold, and suggests a bihemispheric focus. No electrographic seizures were recorded.   REVIEW OF SYSTEMS: Constitutional: No fevers, chills, sweats, or change in appetite Eyes: No visual changes, double vision, eye pain Ear, nose and throat: No hearing loss, ear pain, nasal congestion, sore throat Cardiovascular: No chest pain, palpitations Respiratory:  No shortness of breath at rest or with exertion.   No wheezes GastrointestinaI: No nausea, vomiting, diarrhea, abdominal pain, fecal incontinence Genitourinary:  No dysuria, urinary retention or frequency.  No nocturia. Musculoskeletal:  No neck pain, back pain Integumentary: No rash, pruritus, skin lesions Neurological: as above Psychiatric: No depression at this time.  No anxiety Endocrine: No palpitations, diaphoresis, change in appetite, change in weigh or increased thirst Hematologic/Lymphatic:  No anemia, purpura, petechiae. Allergic/Immunologic: No itchy/runny eyes, nasal congestion, recent allergic reactions, rashes  ALLERGIES: Allergies  Allergen Reactions   Lamictal  [Lamotrigine ] Rash    HOME MEDICATIONS:  Current Outpatient Medications:    acetaminophen  (TYLENOL ) 325 MG tablet, Take 650 mg by mouth every 6 (six) hours as needed., Disp: , Rfl:    ARIPiprazole  (ABILIFY ) 2 MG tablet, Take 1 tablet (2 mg total) by mouth at bedtime., Disp: 30 tablet, Rfl: 1   aspirin  EC 81 MG tablet, Take 1 tablet (81 mg total) by mouth daily. Swallow whole., Disp: , Rfl:    atorvastatin  (LIPITOR) 40 MG tablet, Take 1 tablet (40 mg total) by mouth daily., Disp: 90 tablet, Rfl: 1   clotrimazole-betamethasone (LOTRISONE) cream, Apply topically 2 (two) times daily., Disp: , Rfl:    escitalopram  (LEXAPRO ) 20 MG tablet, Take 20 mg by mouth daily., Disp: , Rfl:    levETIRAcetam  (KEPPRA ) 1000 MG tablet, Take 1 tablet (1,000 mg total) by mouth 2 (two) times daily., Disp: 60 tablet, Rfl: 3   levothyroxine  (SYNTHROID ) 50 MCG  tablet, TAKE ONE TABLET BY MOUTH EVERY DAY., Disp: 90 tablet, Rfl: 3   melatonin 3 MG TABS tablet, Take 1 tablet (3 mg total) by mouth at bedtime., Disp: , Rfl:    omeprazole  (PRILOSEC) 40 MG capsule, Take 1 capsule (40 mg total) by mouth in the morning and at bedtime., Disp: 180 capsule, Rfl: 0   oxybutynin  (DITROPAN ) 5 MG tablet, Take 1 tablet (5 mg total) by mouth every 8 (eight) hours as needed for bladder spasms., Disp: 90 tablet, Rfl: 0   polyethylene glycol (MIRALAX  / GLYCOLAX ) 17 g packet, Take 17 g by mouth daily as needed for mild constipation., Disp: 30 each, Rfl: 0   pregabalin  (LYRICA ) 50 MG capsule, Take 1 capsule (50 mg total) by mouth 2 (two) times daily., Disp: 60 capsule, Rfl: 3   tizanidine (ZANAFLEX) 2 MG capsule, Take 2 mg by mouth every 6 (six) hours., Disp: , Rfl:    lamoTRIgine  (LAMICTAL ) 25 MG tablet, Take 1 tablet (25 mg total) by mouth 2 (two) times daily. (Patient not taking: Reported on 06/21/2024), Disp: 60  tablet, Rfl: 1   oxyBUTYnin  Chloride 2.5 MG TABS, Take 2.5 mg by mouth every 8 (eight) hours as needed., Disp: 90 tablet, Rfl: 0  PAST MEDICAL HISTORY: Past Medical History:  Diagnosis Date   Anxiety    Arthritis    Avascular necrosis of hip (HCC)    left   Bipolar 1 disorder (HCC)    Chronic left hip pain    Chronic nausea    Depression    Diverticulosis of colon    Family hx of colon cancer    age 88-father   Folate deficiency    GERD (gastroesophageal reflux disease)    Headache    Heart murmur    Hx of abnormal Pap smear    Hyperlipidemia    Hypothyroidism    Keratosis, actinic    Low back pain 03/01/2013   MRI with multiple levels of disc bulge   Neuropathy    Panic attacks    Polysubstance abuse (HCC)    PTSD (post-traumatic stress disorder)    S/P colonoscopy 05/30/2010   LAX sphincter tone, anal papilla, left-sided diverticulosis, normal random biopsies,, 1 polyp-TA   S/P endoscopy 05/30/2010   Dr Rourk-> non--critical Schatzki's ring,  s/p 96F dilation   Seizures (HCC)    Stroke (HCC) 2014   Right parietal, no deficits    Tubular adenoma of colon 05/30/2010   Next colonoscopy 05/2015   UTI (urinary tract infection)     PAST SURGICAL HISTORY: Past Surgical History:  Procedure Laterality Date   COLONOSCOPY     every 5 years   COLONOSCOPY  05/30/2010   RMR:lax anal sphincter tone,anal papilla,otherwise normal/left-sided diverticula   ESOPHAGOGASTRODUODENOSCOPY  05/30/2010   MFM:wnwdryjusxp'd ring/small HH otherwise normal   EXTERNAL EAR SURGERY Left 12 years ago   skin graft from behind ear put in ear canal   JOINT REPLACEMENT     NECK SURGERY  10 years ago   SKIN CANCER DESTRUCTION     TOTAL HIP ARTHROPLASTY Left 04/19/2014   Procedure: LEFT TOTAL HIP ARTHROPLASTY ANTERIOR APPROACH;  Surgeon: Dempsey Melodi GAILS, MD;  Location: [facility];  Service: Orthopedics;  Laterality: Left;   TOTAL HIP ARTHROPLASTY Right 05/23/2015   Procedure: RIGHT TOTAL HIP ARTHROPLASTY ANTERIOR APPROACH;  Surgeon: Dempsey Melodi, MD;  Location: [facility];  Service: Orthopedics;  Laterality: Right;    FAMILY HISTORY: Family History  Problem Relation Age of Onset   Diabetes Mother    Hypertension Mother    Colon cancer Father 64       living    Hypertension Father    Hypertension Sister    Heart attack Brother    Seizures Maternal Aunt     SOCIAL HISTORY: Social History   Socioeconomic History   Marital status: Divorced    Spouse name: Not on file   Number of children: 1   Years of education: Not on file   Highest education level: Some college, no degree  Occupational History   Occupation: disabled  Tobacco Use   Smoking status: Former    Current packs/day: 0.00    Average packs/day: 0.5 packs/day for 30.0 years (15.0 ttl pk-yrs)    Types: Cigarettes    Start date: 04/24/1992    Quit date: 04/24/2022    Years since quitting: 2.1   Smokeless tobacco: Never   Tobacco comments:    trying to quit  Vaping Use   Vaping status: Never  Used  Substance and Sexual Activity   Alcohol use: No  Alcohol/week: 0.0 standard drinks of alcohol   Drug use: No    Comment: hx cocaine abuse   Sexual activity: Never    Birth control/protection: None  Other Topics Concern   Not on file  Social History Narrative   Patient lives at home alone and she is single.    Disabled.   Education college education.   Right handed.   Caffeine mountain dew four daily.    Social Drivers of Corporate investment banker Strain: Low Risk  (06/03/2024)   Overall Financial Resource Strain (CARDIA)    Difficulty of Paying Living Expenses: Not hard at all  Food Insecurity: No Food Insecurity (06/03/2024)   Hunger Vital Sign    Worried About Running Out of Food in the Last Year: Never true    Ran Out of Food in the Last Year: Never true  Transportation Needs: Unmet Transportation Needs (06/03/2024)   PRAPARE - Transportation    Lack of Transportation (Medical): Yes    Lack of Transportation (Non-Medical): Yes  Physical Activity: Inactive (06/03/2024)   Exercise Vital Sign    Days of Exercise per Week: 0 days    Minutes of Exercise per Session: Not on file  Stress: Stress Concern Present (06/03/2024)   [employer] of Occupational Health - Occupational Stress Questionnaire    Feeling of Stress: Very much  Social Connections: Socially Isolated (06/03/2024)   Social Connection and Isolation Panel    Frequency of Communication with Friends and Family: More than three times a week    Frequency of Social Gatherings with Friends and Family: Once a week    Attends Religious Services: Never    Database administrator or Organizations: No    Attends Engineer, structural: Not on file    Marital Status: Divorced  Intimate Partner Violence: Not At Risk (03/23/2024)   Humiliation, Afraid, Rape, and Kick questionnaire    Fear of Current or Ex-Partner: No    Emotionally Abused: No    Physically Abused: No    Sexually Abused: No        PHYSICAL EXAM  Vitals:   06/21/24 1103  BP: 129/84  Pulse: 73  Weight: 194 lb (88 kg)  Height: 5' 6 (1.676 m)    Body mass index is 31.31 kg/m.   General: The patient is well-developed and well-nourished and in no acute distress  HEENT:  Head is [place]/AT.  Sclera are anicteric.    Neck: No carotid bruits are noted.  The neck is nontender.  Cardiovascular: The heart has a regular rate and rhythm with a normal S1 and S2. There were no murmurs, gallops or rubs.    Skin: Extremities are without rash or  edema.  Musculoskeletal:  Back is nontender  Neurologic Exam  Mental status: The patient is alert and oriented x 3 at the time of the examination. The patient has apparent normal recent and remote memory, with an apparently normal attention span and concentration ability.   Speech is normal.  Cranial nerves: Extraocular movements are full. There is good facial sensation to soft touch bilaterally.Facial strength is normal.  Trapezius and sternocleidomastoid strength is normal. No dysarthria is noted. No obvious hearing deficits are noted.  Motor:  Muscle bulk is normal.   Tone is normal. Strength is  4+ / 5 in both legs  Sensory: She reports reduced sensation  Coordination: Cerebellar testing reveals good finger-nose-finger and reduced heel-to-shin bilaterally.  Gait and station: She needs support to stand up.  When she is up, she needs bilateral support to stay standing due to very poor balance.  Steps are very poor even with bilateral support.  Reflexes: Deep tendon reflexes are symmetric and normal bilaterally.   Plantar responses are flexor.    DIAGNOSTIC DATA (LABS, IMAGING, TESTING) - I reviewed patient records, labs, notes, testing and imaging myself where available.  Lab Results  Component Value Date   WBC 7.3 03/23/2024   HGB 12.3 03/23/2024   HCT 41.9 03/23/2024   MCV 77.0 (L) 03/23/2024   PLT 209 03/23/2024      Component Value Date/Time   NA  139 03/23/2024 1113   NA 142 09/07/2017 1459   NA 144 10/27/2014 0625   K 3.8 03/23/2024 1113   K 4.2 10/27/2014 0625   K 4.4 08/27/2011 1314   CL 103 03/23/2024 1113   CL 107 10/27/2014 0625   CL 102 08/27/2011 1314   CO2 25 03/23/2024 1113   CO2 29 10/27/2014 0625   CO2 25 08/27/2011 1314   GLUCOSE 115 (H) 03/23/2024 1113   GLUCOSE 94 10/27/2014 0625   BUN 7 (L) 03/23/2024 1113   BUN 7 09/07/2017 1459   BUN 10 10/27/2014 0625   CREATININE 1.01 (H) 03/23/2024 1113   CREATININE 1.03 03/03/2024 1432   CALCIUM  9.1 03/23/2024 1113   CALCIUM  8.9 10/27/2014 0625   CALCIUM  9.9 08/27/2011 1314   PROT 6.8 03/23/2024 1113   PROT 5.9 (L) 05/19/2023 1526   PROT 5.9 (L) 10/27/2014 0625   ALBUMIN 3.8 03/23/2024 1113   ALBUMIN 4.1 09/07/2017 1459   ALBUMIN 3.3 (L) 10/27/2014 0625   AST 17 03/23/2024 1113   AST 16 10/27/2014 0625   AST 11 08/27/2011 1314   ALT 11 03/23/2024 1113   ALT 14 10/27/2014 0625   ALKPHOS 109 03/23/2024 1113   ALKPHOS 119 (H) 10/27/2014 0625   ALKPHOS 91 08/27/2011 1314   BILITOT 0.9 03/23/2024 1113   BILITOT <0.2 09/07/2017 1459   BILITOT 0.4 10/27/2014 0625   BILITOT 0.5 08/27/2011 1314   GFRNONAA >60 03/23/2024 1113   GFRNONAA 58 (L) 05/14/2020 1528   GFRAA 68 05/14/2020 1528   Lab Results  Component Value Date   CHOL 198 03/23/2024   HDL 48 03/23/2024   LDLCALC 136 (H) 03/23/2024   TRIG 70 03/23/2024   CHOLHDL 4.1 03/23/2024   Lab Results  Component Value Date   HGBA1C 5.8 (H) 03/23/2024   Lab Results  Component Value Date   VITAMINB12 278 10/20/2023   Lab Results  Component Value Date   TSH 0.79 03/03/2024       ASSESSMENT AND PLAN  Polyneuropathy - Plan: Vitamin B12, Multiple Myeloma Panel (SPEP&IFE w/QIG), cyanocobalamin  (VITAMIN B12) injection 1,000 mcg  Partial epilepsy with impairment of consciousness (HCC)  History of ischemic stroke  Gait disorder  Poor gait - She feels gait worsened April 2025 but exam today is  similar to exam 04/2023.    I believe it is most likely due to B12 deficiency that occurred in 2024.  Her stroke was 10 years earlier and she had recovered so probably unrelated.  Recent MRI showed no acute findings.  Apparently, she is no longer on B12 supplements.  We will have her do monthly supplement B12 shots. Continue Keppra  for seizure history. Return in 1 year.  40-minute office visit with the majority of the time spent face-to-face for history and physical, discussion/counseling and decision-making.  Additional time with record review and documentation.  This visit is part of a comprehensive longitudinal care medical relationship regarding the patients primary diagnosis of polyneuropathy/gait disturbance and related concerns.    Nevayah Faust A. Vear, MD, [facility] 06/21/2024, 12:48 PM Certified in Neurology, Clinical Neurophysiology, Sleep Medicine and Neuroimaging  [facility] [address], [address] [place], [place] [address] [phone]

## 2024-06-23 ENCOUNTER — Ambulatory Visit: Payer: Self-pay | Admitting: Neurology

## 2024-06-23 LAB — MULTIPLE MYELOMA PANEL, SERUM
Albumin SerPl Elph-Mcnc: 3.4 g/dL (ref 2.9–4.4)
Albumin/Glob SerPl: 1.4 (ref 0.7–1.7)
Alpha 1: 0.3 g/dL (ref 0.0–0.4)
Alpha2 Glob SerPl Elph-Mcnc: 0.8 g/dL (ref 0.4–1.0)
B-Globulin SerPl Elph-Mcnc: 0.9 g/dL (ref 0.7–1.3)
Gamma Glob SerPl Elph-Mcnc: 0.6 g/dL (ref 0.4–1.8)
Globulin, Total: 2.5 g/dL (ref 2.2–3.9)
IgA/Immunoglobulin A, Serum: 120 mg/dL (ref 87–352)
IgG (Immunoglobin G), Serum: 762 mg/dL (ref 586–1602)
IgM (Immunoglobulin M), Srm: 56 mg/dL (ref 26–217)
Total Protein: 5.9 g/dL — AB (ref 6.0–8.5)

## 2024-06-23 LAB — VITAMIN B12: Vitamin B-12: 459 pg/mL (ref 232–1245)

## 2024-07-14 ENCOUNTER — Telehealth: Payer: Self-pay | Admitting: Neurology

## 2024-07-14 DIAGNOSIS — R42 Dizziness and giddiness: Secondary | ICD-10-CM

## 2024-07-14 NOTE — Telephone Encounter (Signed)
 Spoke w/ Dr. Vear who agreed to refer pt to ENT for further evaluation. Referral placed.   Called pt back. Relayed above. Provided her their phone # [phone]. She will f/u if she does not hear in near future to schedule appt.

## 2024-07-14 NOTE — Telephone Encounter (Signed)
 Last saw Dr. Vear 06/21/24. I called pt at [phone]. Feels sx about the same as last visit. However, she is currently at inpatient rehab Center for PT. Being d/c'd tomorrow. Insurance not covering any more days. They do not feel she is stable for d/c. No seizures since last seen.   Further hx she wanted to provide to MD: Dropped bobby pin in ear when younger. Got recurrent ear infections after. In 1990's had ear surgery. Reports ongoing dizziness when she stands/off balance afterwards. Wondering if she has inner ear problem. Has not seen ENT in years. Open to be referred for evaluation. Previously saw Glendia Dolly, [place], [place] At [facility].  Aware I will speak with MD and call her back.

## 2024-07-14 NOTE — Telephone Encounter (Signed)
 Pt reports she is off balance and uncoordinated. Pt wants to know if as a result of her having inner ear could this be related to vertigo, please call.

## 2024-07-15 ENCOUNTER — Telehealth: Payer: Self-pay | Admitting: Neurology

## 2024-07-15 NOTE — Telephone Encounter (Signed)
 Referral To ENT faxed to [facility]   [facility] [phone] Fax: [phone]

## 2024-07-18 ENCOUNTER — Telehealth: Payer: Self-pay

## 2024-07-18 NOTE — Telephone Encounter (Signed)
 Received fax message on 07/15/24: Thank you for referring Sheryl Hall to [facility].  The appointment has been scheduled for Monday, September 15 at 3:00 pm  with Dr. Blair; location: [address] [place], [address] [place], [place] [address]

## 2024-07-18 NOTE — Transitions of Care (Post Inpatient/ED Visit) (Signed)
 07/18/2024  Name: Sheryl Hall MRN: [id number] DOB: [birth year]  Today's TOC FU Call Status: Today's TOC FU Call Status:: Successful TOC FU Call Completed TOC FU Call Complete Date: 07/18/24 Patient's Name and Date of Birth confirmed.  Transition Care Management Follow-up Telephone Call Date of Discharge: 07/15/24 Discharge Facility: Other ([facility]) Name of Other ([facility]) Discharge Facility: [place] Type of Discharge: Inpatient Admission Primary Inpatient Discharge Diagnosis:: gastroenteritis How have you been since you were released from the hospital?: Better Any questions or concerns?: No  Items Reviewed: Did you receive and understand the discharge instructions provided?: Yes Medications obtained,verified, and reconciled?: Yes (Medications Reviewed) Any new allergies since your discharge?: No Dietary orders reviewed?: Yes Do you have support at home?: No  Medications Reviewed Today: Medications Reviewed Today     Reviewed by Emmitt Pan, LPN (Licensed Practical Nurse) on 07/18/24 at [phone]  Med List Status: <None>   Medication Order Taking? Sig Documenting Provider Last Dose Status Informant  acetaminophen  (TYLENOL ) 325 MG tablet [id number] Yes Take 650 mg by mouth every 6 (six) hours as needed. [provider]  Active   ARIPiprazole  (ABILIFY ) 2 MG tablet [id number]  Take 1 tablet (2 mg total) by mouth at bedtime.  Patient not taking: Reported on 07/18/2024   [name], MD  Active Pharmacy Records, Self  aspirin  EC 81 MG tablet [id number] Yes Take 1 tablet (81 mg total) by mouth daily. Swallow whole. Johnson, Clanford L, MD  Active   atorvastatin  (LIPITOR) 40 MG tablet [id number] Yes Take 1 tablet (40 mg total) by mouth daily. Aletha Bene, MD  Active   clotrimazole-betamethasone [place]ILLIS) cream [id number] Yes Apply topically 2 (two) times daily. [provider]  Active   cyanocobalamin  (VITAMIN B12) 1000 MCG/ML injection  [id number] Yes Inject 1 cc subcutaneously or intramuscularly monthly.  Dispense each prescription with 1 cc syringe with 1 inch 25-gauge needles Sater, Charlie LABOR, MD  Active   escitalopram  (LEXAPRO ) 20 MG tablet [id number]  Take 20 mg by mouth daily.  Patient not taking: Reported on 07/18/2024   [provider]  Active   lamoTRIgine  (LAMICTAL ) 25 MG tablet [id number]  Take 1 tablet (25 mg total) by mouth 2 (two) times daily.  Patient not taking: Reported on 07/18/2024   [name], MD  Active Pharmacy Records, Self  levETIRAcetam  (KEPPRA ) 1000 MG tablet [id number] Yes Take 1 tablet (1,000 mg total) by mouth 2 (two) times daily. [name], MD  Active   levothyroxine  (SYNTHROID ) 50 MCG tablet [id number] Yes TAKE ONE TABLET BY MOUTH EVERY DAY. [name], MD  Active Pharmacy Records, Self   Patient taking differently:   Discontinued 02/29/20 1516 lithium  carbonate 150 MG capsule [id number] Yes Take 150 mg by mouth 3 (three) times daily with meals. [provider]  Active   melatonin 3 MG TABS tablet [id number] Yes Take 1 tablet (3 mg total) by mouth at bedtime. Vicci Afton CROME, MD  Active   omeprazole  (PRILOSEC) 40 MG capsule [id number] Yes Take 1 capsule (40 mg total) by mouth in the morning and at bedtime. [name], MD  Active   oxybutynin  (DITROPAN ) 5 MG tablet [id number] Yes Take 1 tablet (5 mg total) by mouth every 8 (eight) hours as needed for bladder spasms. [name], MD  Active   polyethylene glycol (MIRALAX  / GLYCOLAX ) 17 g packet [id number] Yes Take 17 g by mouth daily as needed for mild constipation. Vicci Afton CROME, MD  Active  pregabalin  (LYRICA ) 50 MG capsule [id number] Yes Take 1 capsule (50 mg total) by mouth 2 (two) times daily. [name], MD  Active   tizanidine (ZANAFLEX) 2 MG capsule [id number] Yes Take 2 mg by mouth every 6 (six) hours. [provider]  Active   traZODone  (DESYREL ) 50 MG tablet [id number] Yes Take  50 mg by mouth at bedtime. [provider]  Active   Med List Note Blase Lonell POUR, CPhT 07/21/19 1807): Pharmacy records were used to confirm all medications            Home Care and Equipment/Supplies: Were Home Health Services Ordered?: NA Any new equipment or medical supplies ordered?: Yes Name of Medical supply agency?: unknown Were you able to get the equipment/medical supplies?: Yes Do you have any questions related to the use of the equipment/supplies?: No  Functional Questionnaire: Do you need assistance with bathing/showering or dressing?: Yes Do you need assistance with meal preparation?: Yes Do you need assistance with eating?: No Do you have difficulty maintaining continence: No Do you need assistance with getting out of bed/getting out of a chair/moving?: No Do you have difficulty managing or taking your medications?: No  Follow up appointments reviewed: PCP Follow-up appointment confirmed?: Yes Date of PCP follow-up appointment?: 08/09/24 (already schedule, patient didn't want to change appt) Follow-up Provider: [facility] Follow-up appointment confirmed?: Yes Date of Specialist follow-up appointment?: 08/08/24 Follow-Up Specialty Provider:: ENT Do you need transportation to your follow-up appointment?: No Do you understand care options if your condition(s) worsen?: Yes-patient verbalized understanding    SIGNATURE Julian Lemmings, LPN [facility] Nurse Health Advisor Direct Dial [phone]

## 2024-07-20 ENCOUNTER — Telehealth: Payer: Self-pay

## 2024-07-20 NOTE — Telephone Encounter (Signed)
 Copied from CRM [id number]. Topic: Clinical - Medication Question >> Jul 20, 2024 10:52 AM Amy B wrote: Reason for CRM: Patient asks if you can switch her prescription of Lyrica  to Gabapentin  instead.  Please advise.

## 2024-08-09 ENCOUNTER — Encounter: Payer: Self-pay | Admitting: Family Medicine

## 2024-08-09 ENCOUNTER — Ambulatory Visit (INDEPENDENT_AMBULATORY_CARE_PROVIDER_SITE_OTHER): Payer: MEDICAID | Admitting: Family Medicine

## 2024-08-09 VITALS — BP 124/72 | HR 85 | Temp 98.6°F | Ht 66.0 in | Wt 198.8 lb

## 2024-08-09 DIAGNOSIS — Z23 Encounter for immunization: Secondary | ICD-10-CM

## 2024-08-09 DIAGNOSIS — I6930 Unspecified sequelae of cerebral infarction: Secondary | ICD-10-CM

## 2024-08-09 DIAGNOSIS — F3163 Bipolar disorder, current episode mixed, severe, without psychotic features: Secondary | ICD-10-CM | POA: Diagnosis not present

## 2024-08-09 DIAGNOSIS — E538 Deficiency of other specified B group vitamins: Secondary | ICD-10-CM

## 2024-08-09 DIAGNOSIS — I639 Cerebral infarction, unspecified: Secondary | ICD-10-CM

## 2024-08-09 DIAGNOSIS — Z5982 Transportation insecurity: Secondary | ICD-10-CM | POA: Diagnosis not present

## 2024-08-09 MED ORDER — LITHIUM CARBONATE 300 MG PO TABS
300.0000 mg | ORAL_TABLET | Freq: Three times a day (TID) | ORAL | 5 refills | Status: DC
Start: 2024-08-09 — End: 2024-10-17

## 2024-08-09 MED ORDER — PREGABALIN 50 MG PO CAPS: 50.0000 mg | ORAL_CAPSULE | Freq: Three times a day (TID) | ORAL | 3 refills | Status: DC

## 2024-08-09 NOTE — Progress Notes (Signed)
 Subjective:    Patient ID: Sheryl Hall, female    DOB: [birth year], 62 y.o.   MRN: [id number]  HPI  04/07/24 Patient was recently admitted to the hospital.  I have included her discharge below reference. Admit date: 03/23/2024 Discharge date: 03/31/2024 Admitted From:  Home  Disposition:  Home   Recommendations for Outpatient Follow-up:  Follow up with PCP in 1 weeks Follow up with neurologist Dr. Vear in 2 weeks   Home Health:  Aide   Discharge Condition: STABLE   CODE STATUS: FULL DIET: soft foods, advance as tolerated     Brief Hospitalization Summary: Please see all hospital notes, images, labs for full details of the hospitalization. Admission provider HPI:  63 y.o. female with medical history significant for hypertension, dyslipidemia, prior CVA, GERD, hypothyroidism, bipolar disorder, anxiety/depression, and arthritis who presented to the ED for left-sided weakness and numbness and difficulty walking that began at approximately 8 AM when she woke up.  She normally uses wheelchair at home, but is able to transfer from wheelchair to couch and was not able to do so today.  She was admitted for CVA evaluation and brain MRI negative.  She has been seen by PT who is now recommending SNF placement, but unfortunately she cannot be placed given her issues with insurance.  Patient is medically stable for discharge.    Hospital course by listed problems addressed    Left-sided hemiparesis concern for TIA versus functional neurological disorder CVA has been RULED OUT - Appreciate neurology recommendations, brain MRI with no acute findings. - 2D echocardiogram as noted below with preserved LVEF - Continue aspirin  81 mg daily for secondary prevention. - Continue the use of Lipitor; LDL 136. - A1c 5.8%, continue monitor outpatient; modified carbohydrate diet discussed with patient. - PT recommending SNF placement, she cannot be placed given insurance issues and will need ongoing PT  evaluation. TOC was able to arrange increased assistance from Aide at home but not able to arrange any form of SNF rehab or HH due to none of them accepting patient's insurance.  - please see TOC notes regarding disposition.  I was told only option is for patient to go home with the increased assistance from personal care aides arranged by her insurance company.    Seizure disorder - Continue home Keppra  - No seizure activity appreciated.   Bipolar disorder/anxiety/depression - Continue home medications - currently has been stable  - No suicidal ideation or hallucination.   Hypothyroidism - Continue levothyroxine    GERD - Continue the use of PPI - Lifestyle changes discussed with patient.     History of CVA/dyslipidemia - Continue statin and aspirin  for secondary prevention   Overactive bladder -continue oxybutynin    Constipation - Bowel movement achieved with bisacodyl  suppository - Continue to maintain adequate hydration - Patient advised to increase fiber intake and miralax  as ordered.    Discharge Diagnoses:  Principal Problem:   Leg weakness Active Problems:   Depression   Bipolar disorder (HCC)   Hyperthyroidism   Hyperlipidemia   Constipation   Weakness generalized   After leaving the hospital at [facility], the patient went to [facility] emergency room for admission for weakness.SABRA  [facility] declined admission given that there was not an ongoing medical emergency: ED Course  Medical Decision Making 63 year old female who was discharged from [facility] health after an 8-day visit where she presented with left lower extremity weakness. A CVA was ruled out during patient's evaluation and she was diagnosed with a  TIA. Per the discharge summary from the hospitalist they were hoping to get patient to a skilled nursing facility however secondary to her insurance that was not possible. Patient was considered medically stable for discharge to home. - On presentation family member reported  no change in patient's physical exam, mentation or presentation since they left [facility] and wrote to home. They came to this facility reportedly because they ask what they should do next since they cannot get into a skilled nursing facility they were told to go to another emergency department for reevaluation - After reviewing patient's discharge summary which included her hospital course I informed him that I would be willing to repeat some blood work however would not be repeating the CAT scans and MRIs performed at [facility]. I informed him that if there were some electrolyte abnormalities that required treatment/admission they would be addressed - Family member declined stating that nothing should have changed within the last 2 or 3 hours since they left the other hospital and patient was subsequently discharged to home   Patient today scheduled a telephone visit for follow-up with me.  Phone call began at [phone].  Phone call concluded at 1016.  Patient consents to be seen via telephone.  She is currently at home.  I am currently in the office.  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  Patient is currently on aspirin  81 mg a day.  Her LDL cholesterol is greater than 130.  I started her on atorvastatin  40 mg a day.  She could certainly a little in 3 months.  She is currently receiving vitamin B12 injections monthly.  Her last B12 level was just below 300 in November.  Patient is due to recheck this at her earliest convenience.  She was also started on oxybutynin  in the hospital 2.5 mg 3 times daily for urinary frequency and urgency.  This is held.  She was going every hour.  She is now going 2 or 3 times at night.  Her insurance case Production [employer], has contacted the [facility].  They are going to admit the patient for 30-day short-term rehab.  She is waiting for bed availability.  They are supposed to contact her as soon as a bed becomes available.  Patient denies  any chest pain or chest of breath or dyspnea exertion.  She denies any leg swelling syncope fatigue.  She denies any burning with urination.  At that time, my plan was: Based on the old stroke seen on the MRI I would like to get her LDL cholesterol is close to 55 is possible.  Continue aspirin  and atorvastatin .  Recheck a CMP and a fasting lipid panel in 3 months.  I would also like to check a vitamin B12 level at the patient's next office visit to ensure that she is getting sufficient parenteral B12 given her history of polyneuropathy.  I believe the patient would dramatically benefit from intensive physical therapy.  Therefore I am hopeful [facility] will be able to get her  a bed as soon as possible.  Patient is currently on oxybutynin  2.5 mg 3 times daily for overactive bladder.  This is working well for her.  08/09/24  Patient was in physical therapy daily in rehab for 5 weeks.  She states that she is better.  She still feels that she is unable to.  She was walking with physical therapy using a walker.  However she was then discharged.  She is currently living at home.  She does not drive.  She wakes up and sits on the couch most days.  Her daughter states that she drinks sodas and eats junk food.  Patient admits that she is not doing anything around the home.  She basically just watches TV.  She reports feeling depressed.  At the nursing home, they stopped her Abilify  along with her Lexapro .  She was started on lithium  150 mg 3 times a day.  She has no follow-up with psychiatry. Past Medical History:  Diagnosis Date   Anxiety    Arthritis    Avascular necrosis of hip (HCC)    left   Bipolar 1 disorder (HCC)    Chronic left hip pain    Chronic nausea    Depression    Diverticulosis of colon    Family hx of colon cancer    age 67-father   Folate deficiency    GERD (gastroesophageal reflux disease)    Headache    Heart murmur    Hx of abnormal Pap smear    Hyperlipidemia     Hypothyroidism    Keratosis, actinic    Low back pain 03/01/2013   MRI with multiple levels of disc bulge   Neuropathy    Panic attacks    Polysubstance abuse (HCC)    PTSD (post-traumatic stress disorder)    S/P colonoscopy 05/30/2010   LAX sphincter tone, anal papilla, left-sided diverticulosis, normal random biopsies,, 1 polyp-TA   S/P endoscopy 05/30/2010   Dr Rourk-> non--critical Schatzki's ring, s/p 37F dilation   Seizures (HCC)    Stroke (HCC) 2014   Right parietal, no deficits    Tubular adenoma of colon 05/30/2010   Next colonoscopy 05/2015   UTI (urinary tract infection)    Past Surgical History:  Procedure Laterality Date   COLONOSCOPY     every 5 years   COLONOSCOPY  05/30/2010   RMR:lax anal sphincter tone,anal papilla,otherwise normal/left-sided diverticula   ESOPHAGOGASTRODUODENOSCOPY  05/30/2010   MFM:wnwdryjusxp'd ring/small HH otherwise normal   EXTERNAL EAR SURGERY Left 12 years ago   skin graft from behind ear put in ear canal   JOINT REPLACEMENT     NECK SURGERY  10 years ago   SKIN CANCER DESTRUCTION     TOTAL HIP ARTHROPLASTY Left 04/19/2014   Procedure: LEFT TOTAL HIP ARTHROPLASTY ANTERIOR APPROACH;  Surgeon: Dempsey Melodi GAILS, MD;  Location: [facility];  Service: Orthopedics;  Laterality: Left;   TOTAL HIP ARTHROPLASTY Right 05/23/2015   Procedure: RIGHT TOTAL HIP ARTHROPLASTY ANTERIOR APPROACH;  Surgeon: Dempsey Melodi, MD;  Location: [facility];  Service: Orthopedics;  Laterality: Right;   Current Outpatient Medications on File Prior to Visit  Medication Sig Dispense Refill   acetaminophen  (TYLENOL ) 325 MG tablet Take 650 mg by mouth every 6 (six) hours as needed.     ARIPiprazole  (ABILIFY ) 2 MG tablet Take 1 tablet (2 mg total) by mouth at bedtime. (Patient not taking: Reported on 07/18/2024) 30 tablet 1   aspirin  EC 81 MG tablet Take 1 tablet (81 mg total) by mouth daily. Swallow whole.     atorvastatin  (LIPITOR) 40 MG tablet Take 1 tablet (40 mg total) by  mouth daily. 90 tablet 1   clotrimazole-betamethasone (LOTRISONE) cream Apply topically 2 (two) times daily.     cyanocobalamin  (VITAMIN B12) 1000 MCG/ML injection Inject 1 cc subcutaneously or intramuscularly monthly.  Dispense each prescription with 1 cc syringe with 1 inch 25-gauge needles 3 mL 4  escitalopram  (LEXAPRO ) 20 MG tablet Take 20 mg by mouth daily. (Patient not taking: Reported on 07/18/2024)     lamoTRIgine  (LAMICTAL ) 25 MG tablet Take 1 tablet (25 mg total) by mouth 2 (two) times daily. (Patient not taking: Reported on 07/18/2024) 60 tablet 1   levETIRAcetam  (KEPPRA ) 1000 MG tablet Take 1 tablet (1,000 mg total) by mouth 2 (two) times daily. 60 tablet 3   levothyroxine  (SYNTHROID ) 50 MCG tablet TAKE ONE TABLET BY MOUTH EVERY DAY. 90 tablet 3   lithium  carbonate 150 MG capsule Take 150 mg by mouth 3 (three) times daily with meals.     melatonin 3 MG TABS tablet Take 1 tablet (3 mg total) by mouth at bedtime.     omeprazole  (PRILOSEC) 40 MG capsule Take 1 capsule (40 mg total) by mouth in the morning and at bedtime. 180 capsule 0   oxybutynin  (DITROPAN ) 5 MG tablet Take 1 tablet (5 mg total) by mouth every 8 (eight) hours as needed for bladder spasms. 90 tablet 0   polyethylene glycol (MIRALAX  / GLYCOLAX ) 17 g packet Take 17 g by mouth daily as needed for mild constipation. 30 each 0   pregabalin  (LYRICA ) 50 MG capsule Take 1 capsule (50 mg total) by mouth 2 (two) times daily. 60 capsule 3   tizanidine (ZANAFLEX) 2 MG capsule Take 2 mg by mouth every 6 (six) hours.     traZODone  (DESYREL ) 50 MG tablet Take 50 mg by mouth at bedtime.     [DISCONTINUED] LINZESS  72 MCG capsule TAKE ONE CAPSULE ORALLY EVERY MORNING BEFORE BREAKFAST. (Patient taking differently: Take 72 mcg by mouth daily before breakfast. ) 30 capsule 1   No current facility-administered medications on file prior to visit.   Allergies  Allergen Reactions   Lamictal  [Lamotrigine ] Rash   Social History   Socioeconomic  History   Marital status: Divorced    Spouse name: Not on file   Number of children: 1   Years of education: Not on file   Highest education level: Some college, no degree  Occupational History   Occupation: disabled  Tobacco Use   Smoking status: Former    Current packs/day: 0.00    Average packs/day: 0.5 packs/day for 30.0 years (15.0 ttl pk-yrs)    Types: Cigarettes    Start date: 04/24/1992    Quit date: 04/24/2022    Years since quitting: 2.2   Smokeless tobacco: Never   Tobacco comments:    trying to quit  Vaping Use   Vaping status: Never Used  Substance and Sexual Activity   Alcohol use: No    Alcohol/week: 0.0 standard drinks of alcohol   Drug use: No    Comment: hx cocaine abuse   Sexual activity: Never    Birth control/protection: None  Other Topics Concern   Not on file  Social History Narrative   Patient lives at home alone and she is single.    Disabled.   Education college education.   Right handed.   Caffeine mountain dew four daily.    Social Drivers of Corporate investment banker Strain: Low Risk  (06/03/2024)   Overall Financial Resource Strain (CARDIA)    Difficulty of Paying Living Expenses: Not hard at all  Food Insecurity: No Food Insecurity (06/03/2024)   Hunger Vital Sign    Worried About Running Out of Food in the Last Year: Never true    Ran Out of Food in the Last Year: Never true  Transportation Needs: Unmet  Transportation Needs (06/03/2024)   PRAPARE - Administrator, Civil Service (Medical): Yes    Lack of Transportation (Non-Medical): Yes  Physical Activity: Inactive (06/03/2024)   Exercise Vital Sign    Days of Exercise per Week: 0 days    Minutes of Exercise per Session: Not on file  Stress: Stress Concern Present (06/03/2024)   [employer] of Occupational Health - Occupational Stress Questionnaire    Feeling of Stress: Very much  Social Connections: Socially Isolated (06/03/2024)   Social Connection and Isolation  Panel    Frequency of Communication with Friends and Family: More than three times a week    Frequency of Social Gatherings with Friends and Family: Once a week    Attends Religious Services: Never    Database administrator or Organizations: No    Attends Engineer, structural: Not on file    Marital Status: Divorced  Intimate Partner Violence: Not At Risk (03/23/2024)   Humiliation, Afraid, Rape, and Kick questionnaire    Fear of Current or Ex-Partner: No    Emotionally Abused: No    Physically Abused: No    Sexually Abused: No      Review of Systems     Objective:    Cranial nerves II through XII are grossly intact. Cardiac regular rate and rhythm.  No murmurs rubs or gallops. Pulmonary is clear to auscultation bilaterally. There is no thyroid  megaly, lymphadenopathy, or carotid bruit.  Abdomen is soft nontender nondistended with normal bowel sounds.  Vital signs are reviewed       Assessment & Plan:  Cerebrovascular accident (CVA), unspecified mechanism (HCC) - Plan: CBC with Differential/Platelet, Comprehensive metabolic panel with GFR, Lipid panel, Vitamin B12  B12 deficiency - Plan: Vitamin B12  Transportation insecurity  Bipolar disorder, current episode mixed, severe, without psychotic features (HCC) - Plan: TSH  Flu vaccine need - Plan: Flu vaccine trivalent PF, 6mos and older(Flulaval,Afluria,Fluarix,Fluzone) Recommended checking her metabolic panels including B12, TSH, CBC CMP and lipid panel.  I want her LDL cholesterol to be less than 70.  Her sister has been giving her B12 shots monthly.  Check a TSH to ensure there is no evidence of hypothyroidism.  Increase Lamictal  to 300 mg 3 times a day.  However I explained to the patient that she has to try to do more work to improve her situation.  I truly believe some of this is volitional.  I recommended that she set a goal each day such as washing dishes, dusting, vacuuming her room.  I recommended that she try  to accomplish that 1 task every day and then gradually try to increase the complexity of the task so that she has a purpose and something to look forward.  Otherwise I feel that she is just spiraling into a deeper depression.

## 2024-08-10 LAB — COMPREHENSIVE METABOLIC PANEL WITH GFR
AG Ratio: 1.8 (calc) (ref 1.0–2.5)
ALT: 7 U/L (ref 6–29)
AST: 10 U/L (ref 10–35)
Albumin: 4 g/dL (ref 3.6–5.1)
Alkaline phosphatase (APISO): 140 U/L (ref 37–153)
BUN: 8 mg/dL (ref 7–25)
CO2: 28 mmol/L (ref 20–32)
Calcium: 8.7 mg/dL (ref 8.6–10.4)
Chloride: 105 mmol/L (ref 98–110)
Creat: 0.95 mg/dL (ref 0.50–1.05)
Globulin: 2.2 g/dL (ref 1.9–3.7)
Glucose, Bld: 102 mg/dL — ABNORMAL HIGH (ref 65–99)
Potassium: 4.5 mmol/L (ref 3.5–5.3)
Sodium: 139 mmol/L (ref 135–146)
Total Bilirubin: 0.5 mg/dL (ref 0.2–1.2)
Total Protein: 6.2 g/dL (ref 6.1–8.1)
eGFR: 68 mL/min/1.73m2 (ref >=60)

## 2024-08-10 LAB — LIPID PANEL
Cholesterol: 133 mg/dL (ref <200)
HDL: 49 mg/dL — ABNORMAL LOW (ref >=50)
LDL Cholesterol (Calc): 68 mg/dL
Non-HDL Cholesterol (Calc): 84 mg/dL (ref <130)
Total CHOL/HDL Ratio: 2.7 (calc) (ref <5.0)
Triglycerides: 81 mg/dL (ref <150)

## 2024-08-10 LAB — CBC WITH DIFFERENTIAL/PLATELET
Absolute Lymphocytes: 1545 {cells}/uL (ref 850–3900)
Absolute Monocytes: 494 {cells}/uL (ref 200–950)
Basophils Absolute: 52 {cells}/uL (ref 0–200)
Basophils Relative: 0.5 %
Eosinophils Absolute: 330 {cells}/uL (ref 15–500)
Eosinophils Relative: 3.2 %
HCT: 39.7 % (ref 35.0–45.0)
Hemoglobin: 11.8 g/dL (ref 11.7–15.5)
MCH: 23.5 pg — ABNORMAL LOW (ref 27.0–33.0)
MCHC: 29.7 g/dL — ABNORMAL LOW (ref 32.0–36.0)
MCV: 78.9 fL — ABNORMAL LOW (ref 80.0–100.0)
MPV: 12.2 fL (ref 7.5–12.5)
Monocytes Relative: 4.8 %
Neutro Abs: 7880 {cells}/uL — ABNORMAL HIGH (ref 1500–7800)
Neutrophils Relative %: 76.5 %
Platelets: 274 Thousand/uL (ref 140–400)
RBC: 5.03 Million/uL (ref 3.80–5.10)
RDW: 15.1 % — ABNORMAL HIGH (ref 11.0–15.0)
Total Lymphocyte: 15 %
WBC: 10.3 Thousand/uL (ref 3.8–10.8)

## 2024-08-10 LAB — VITAMIN B12: Vitamin B-12: 347 pg/mL (ref 200–1100)

## 2024-08-10 LAB — TSH: TSH: 2.72 m[IU]/L (ref 0.40–4.50)

## 2024-08-11 ENCOUNTER — Ambulatory Visit: Payer: Self-pay | Admitting: Family Medicine

## 2024-08-18 ENCOUNTER — Ambulatory Visit: Payer: Self-pay

## 2024-08-18 NOTE — Telephone Encounter (Signed)
 FYI Only or Action Required?: FYI only for provider.  Patient was last seen in primary care on 08/09/2024 by [name], MD.  Called Nurse Triage reporting Medication Reaction.  Symptoms began a week ago.  Interventions attempted: Nothing.  Symptoms are: unchanged.  Triage Disposition: See PCP When Office is Open (Within 3 Days)  Patient/caregiver understands and will follow disposition?: No, refuses disposition Reason for Disposition  Nursing judgment or information in reference  Answer Assessment - Initial Assessment Questions Been taking Lithium  for a couple months and was not experiencing these symptoms, states its very weird does not think its normal. Patient stated its hard for her to get into the office, she's in a wheelchair. Tried to schedule first available virtual with PCP, patient stated no because she wants her home health aid to be present during the call. Next available within the requested time frame was 10/6. Advised patient on the importance of being seen for these symptoms to adjust medication.  1. REASON FOR CALL: What is your main concern right now?     Lithium  medication side effects  2. ONSET: When did the side effects start?     A week ago, since upping dosage on 9/16  3. SEVERITY: How bad is the side effects?     Patient feels like cannot handle this medication  4. OTHER SYMPTOMS: Do you have any other new symptoms?     Just not feeling right, not sleeping well, unable to stay focused  Protocols used: No Guideline Available-A-AH  Copied from CRM #[id number]. Topic: Clinical - Medical Advice >> Aug 18, 2024 10:14 AM Zy'onna H wrote: Reason for CRM: patient is experiencing the following on: Lithium  Rx (lithium  300 MG tablet) 900 mg/day  Began Administering this dosage: 16/06/2024 The patient stated this dosage changed has caused the following issues:  1.She's up every 15-20 minutes within the night 2. Poor sleep & have absurd dreams, 3.  Patient doesn't feel like herself.   ** Patient feels like she can't handle this medication.

## 2024-08-19 NOTE — Telephone Encounter (Signed)
 Was not able to get pt. On the phone. Called emergency contact . Sheryl Hall. Gave instructions. She understood providers instructions to Decrease lithium  back to 150 mg three times daily. She stated she understood and she would relay the information to her nurse . That if the nurse had any questions she would contact the office.

## 2024-08-23 ENCOUNTER — Ambulatory Visit: Payer: MEDICAID | Admitting: Family Medicine

## 2024-08-25 ENCOUNTER — Telehealth: Payer: Self-pay

## 2024-08-25 ENCOUNTER — Other Ambulatory Visit: Payer: Self-pay

## 2024-08-25 DIAGNOSIS — N3289 Other specified disorders of bladder: Secondary | ICD-10-CM

## 2024-08-25 MED ORDER — OXYBUTYNIN CHLORIDE 5 MG PO TABS: 5.0000 mg | ORAL_TABLET | Freq: Three times a day (TID) | ORAL | 1 refills | Status: DC | PRN

## 2024-08-25 NOTE — Telephone Encounter (Signed)
 Prescription Request  08/25/2024  LOV: 08/09/24  What is the name of the medication or equipment? oxybutynin  (DITROPAN ) 5 MG tablet [[id number]]   Have you contacted your pharmacy to request a refill? Yes   Which pharmacy would you like this sent to?  [facility] [place], [place] - [id number] [place] [address] Phone: [phone] Fax: [phone]    Patient notified that their request is being sent to the clinical staff for review and that they should receive a response within 2 business days.   Please advise at [facility] [phone]

## 2024-08-29 ENCOUNTER — Telehealth: Payer: MEDICAID | Admitting: Family Medicine

## 2024-08-29 DIAGNOSIS — F319 Bipolar disorder, unspecified: Secondary | ICD-10-CM

## 2024-08-29 MED ORDER — ESCITALOPRAM OXALATE 10 MG PO TABS: 10.0000 mg | ORAL_TABLET | Freq: Every day | ORAL | 3 refills | Status: AC

## 2024-08-29 NOTE — Progress Notes (Signed)
 Subjective:    Patient ID: Sheryl Hall, female    DOB: 1961-07-15, 63 y.o.   MRN: [id number]  HPI  Patient is being seen today as a video visit.  Video conference call began at 11:00.  He concluded at 1110.  Patient is currently at home.  I am currently in my office.  Patient consents to be seen via video.  At her last visit, the patient increased her lithium  from 150 mg 3 times daily to 300 mg 3 times daily.  However this caused her to feel dizzy so she called our office.  We told her to resume the previous dose of lithium .  She is back on the 150 mg 3 times daily.  She states that the dizziness is better however she reports feeling anxious on a daily basis.  She states that the feeling of anxiety comes and goes but it is primarily at several times a day.  She feels worried about nothing in particular but just feels extremely jittery and anxious.  She denies any racing thoughts or increased goal-directed behavior or delusions.  Previously she was on Lexapro  along with Abilify  however they discontinued this combination in her last hospital visit and she was started just on lithium  Past Medical History:  Diagnosis Date   Anxiety    Arthritis    Avascular necrosis of hip (HCC)    left   Bipolar 1 disorder (HCC)    Chronic left hip pain    Chronic nausea    Depression    Diverticulosis of colon    Family hx of colon cancer    age 5-father   Folate deficiency    GERD (gastroesophageal reflux disease)    Headache    Heart murmur    Hx of abnormal Pap smear    Hyperlipidemia    Hypothyroidism    Keratosis, actinic    Low back pain 03/01/2013   MRI with multiple levels of disc bulge   Neuropathy    Panic attacks    Polysubstance abuse (HCC)    PTSD (post-traumatic stress disorder)    S/P colonoscopy 05/30/2010   LAX sphincter tone, anal papilla, left-sided diverticulosis, normal random biopsies,, 1 polyp-TA   S/P endoscopy 05/30/2010   Dr Rourk-> non--critical Schatzki's ring, s/p  41F dilation   Seizures (HCC)    Stroke (HCC) 2014   Right parietal, no deficits    Tubular adenoma of colon 05/30/2010   Next colonoscopy 05/2015   UTI (urinary tract infection)    Past Surgical History:  Procedure Laterality Date   COLONOSCOPY     every 5 years   COLONOSCOPY  05/30/2010   RMR:lax anal sphincter tone,anal papilla,otherwise normal/left-sided diverticula   ESOPHAGOGASTRODUODENOSCOPY  05/30/2010   MFM:wnwdryjusxp'd ring/small HH otherwise normal   EXTERNAL EAR SURGERY Left 12 years ago   skin graft from behind ear put in ear canal   JOINT REPLACEMENT     NECK SURGERY  10 years ago   SKIN CANCER DESTRUCTION     TOTAL HIP ARTHROPLASTY Left 04/19/2014   Procedure: LEFT TOTAL HIP ARTHROPLASTY ANTERIOR APPROACH;  Surgeon: Dempsey Melodi GAILS, MD;  Location: [facility];  Service: Orthopedics;  Laterality: Left;   TOTAL HIP ARTHROPLASTY Right 05/23/2015   Procedure: RIGHT TOTAL HIP ARTHROPLASTY ANTERIOR APPROACH;  Surgeon: Dempsey Melodi, MD;  Location: [facility];  Service: Orthopedics;  Laterality: Right;   Current Outpatient Medications on File Prior to Visit  Medication Sig Dispense Refill   acetaminophen  (TYLENOL ) 325 MG  tablet Take 650 mg by mouth every 6 (six) hours as needed.     ARIPiprazole  (ABILIFY ) 2 MG tablet Take 1 tablet (2 mg total) by mouth at bedtime. (Patient not taking: Reported on 08/09/2024) 30 tablet 1   aspirin  EC 81 MG tablet Take 1 tablet (81 mg total) by mouth daily. Swallow whole.     atorvastatin  (LIPITOR) 40 MG tablet Take 1 tablet (40 mg total) by mouth daily. 90 tablet 1   clotrimazole-betamethasone (LOTRISONE) cream Apply topically 2 (two) times daily. (Patient not taking: Reported on 08/09/2024)     cyanocobalamin  (VITAMIN B12) 1000 MCG/ML injection Inject 1 cc subcutaneously or intramuscularly monthly.  Dispense each prescription with 1 cc syringe with 1 inch 25-gauge needles 3 mL 4   lamoTRIgine  (LAMICTAL ) 25 MG tablet Take 1 tablet (25 mg total) by mouth 2  (two) times daily. (Patient not taking: Reported on 08/09/2024) 60 tablet 1   levETIRAcetam  (KEPPRA ) 1000 MG tablet Take 1 tablet (1,000 mg total) by mouth 2 (two) times daily. 60 tablet 3   levothyroxine  (SYNTHROID ) 50 MCG tablet TAKE ONE TABLET BY MOUTH EVERY DAY. 90 tablet 3   lithium  300 MG tablet Take 1 tablet (300 mg total) by mouth 3 (three) times daily. 90 tablet 5   lithium  carbonate 150 MG capsule Take 150 mg by mouth 3 (three) times daily with meals.     melatonin 3 MG TABS tablet Take 1 tablet (3 mg total) by mouth at bedtime.     omeprazole  (PRILOSEC) 40 MG capsule Take 1 capsule (40 mg total) by mouth in the morning and at bedtime. 180 capsule 0   oxybutynin  (DITROPAN ) 5 MG tablet Take 1 tablet (5 mg total) by mouth every 8 (eight) hours as needed for bladder spasms. 90 tablet 1   polyethylene glycol (MIRALAX  / GLYCOLAX ) 17 g packet Take 17 g by mouth daily as needed for mild constipation. 30 each 0   pregabalin  (LYRICA ) 50 MG capsule Take 1 capsule (50 mg total) by mouth 3 (three) times daily. 90 capsule 3   tizanidine (ZANAFLEX) 2 MG capsule Take 2 mg by mouth every 6 (six) hours.     traZODone  (DESYREL ) 50 MG tablet Take 50 mg by mouth at bedtime.     [DISCONTINUED] LINZESS  72 MCG capsule TAKE ONE CAPSULE ORALLY EVERY MORNING BEFORE BREAKFAST. (Patient taking differently: Take 72 mcg by mouth daily before breakfast. ) 30 capsule 1   No current facility-administered medications on file prior to visit.   Allergies  Allergen Reactions   Lamictal  [Lamotrigine ] Rash   Social History   Socioeconomic History   Marital status: Divorced    Spouse name: Not on file   Number of children: 1   Years of education: Not on file   Highest education level: Some college, no degree  Occupational History   Occupation: disabled  Tobacco Use   Smoking status: Former    Current packs/day: 0.00    Average packs/day: 0.5 packs/day for 30.0 years (15.0 ttl pk-yrs)    Types: Cigarettes     Start date: 04/24/1992    Quit date: 04/24/2022    Years since quitting: 2.3   Smokeless tobacco: Never   Tobacco comments:    trying to quit  Vaping Use   Vaping status: Never Used  Substance and Sexual Activity   Alcohol use: No    Alcohol/week: 0.0 standard drinks of alcohol   Drug use: No    Comment: hx cocaine abuse  Sexual activity: Never    Birth control/protection: None  Other Topics Concern   Not on file  Social History Narrative   Patient lives at home alone and she is single.    Disabled.   Education college education.   Right handed.   Caffeine mountain dew four daily.    Social Drivers of Corporate investment banker Strain: Low Risk  (06/03/2024)   Overall Financial Resource Strain (CARDIA)    Difficulty of Paying Living Expenses: Not hard at all  Food Insecurity: No Food Insecurity (06/03/2024)   Hunger Vital Sign    Worried About Running Out of Food in the Last Year: Never true    Ran Out of Food in the Last Year: Never true  Transportation Needs: Unmet Transportation Needs (06/03/2024)   PRAPARE - Transportation    Lack of Transportation (Medical): Yes    Lack of Transportation (Non-Medical): Yes  Physical Activity: Inactive (06/03/2024)   Exercise Vital Sign    Days of Exercise per Week: 0 days    Minutes of Exercise per Session: Not on file  Stress: Stress Concern Present (06/03/2024)   [employer] of Occupational Health - Occupational Stress Questionnaire    Feeling of Stress: Very much  Social Connections: Socially Isolated (06/03/2024)   Social Connection and Isolation Panel    Frequency of Communication with Friends and Family: More than three times a week    Frequency of Social Gatherings with Friends and Family: Once a week    Attends Religious Services: Never    Database administrator or Organizations: No    Attends Engineer, structural: Not on file    Marital Status: Divorced  Intimate Partner Violence: Not At Risk (03/23/2024)    Humiliation, Afraid, Rape, and Kick questionnaire    Fear of Current or Ex-Partner: No    Emotionally Abused: No    Physically Abused: No    Sexually Abused: No      Review of Systems     Objective:         Assessment & Plan:  Bipolar I disorder (HCC) Patient has been on many medications in the past including Vraylar, Latuda, Rexulti , Abilify , Seroquel.  She has also been on Lamictal .  Patient seems to be doing okay on lithium  150 mg 3 times daily.  However I do believe it is reasonable to resume Lexapro  with this mood stabilizer to help with anxiety.  Will use Lexapro  10 mg a day as she has tolerated this medication well in the past.  I am also trying to avoid benzodiazepines

## 2024-10-12 ENCOUNTER — Other Ambulatory Visit: Payer: Self-pay | Admitting: Family Medicine

## 2024-10-14 NOTE — Telephone Encounter (Signed)
 Requested medication (s) are due for refill today -no  Requested medication (s) are on the active medication list -yes  Future visit scheduled -no  Last refill: Rx in file-08/09/24- lithium  300mg  #90 3RF  Notes to clinic: non delegated Rx- dose requested no longer current dosing  Requested Prescriptions  Pending Prescriptions Disp Refills   lithium  carbonate 150 MG capsule [Pharmacy Med Name: lithium  carbonate 150 mg capsule] 90 capsule 1    Sig: TAKE ONE CAPSULE BY MOUTH THREE TIMES DAILY     Not Delegated - Psychiatry:  Antimanics/Bipolar Agents Failed - 10/14/2024  3:20 PM      Failed - This refill cannot be delegated      Failed - Lithium  Level in normal range and within 180 days    Lithium  Lvl  Date Value Ref Range Status  07/21/2019 <0.06 (L) 0.60 - 1.20 mmol/L Final    Comment:    Performed at [facility], [address], [place], [place] [address]         Failed - BMP within normal limits in the last 6 months    Glucose  Date Value Ref Range Status  10/27/2014 94 65 - 99 mg/dL Final  89/96/7987 883 mg/dL Final   Glucose, Bld  Date Value Ref Range Status  08/09/2024 102 (H) 65 - 99 mg/dL Final    Comment:    .            Fasting reference interval . For someone without known diabetes, a glucose value between 100 and 125 mg/dL is consistent with prediabetes and should be confirmed with a follow-up test. .    Glucose-Capillary  Date Value Ref Range Status  03/03/2023 72 70 - 99 mg/dL Final    Comment:    Glucose reference range applies only to samples taken after fasting for at least 8 hours.   Calcium   Date Value Ref Range Status  08/09/2024 8.7 8.6 - 10.4 mg/dL Final  89/96/7987 9.9 mg/dL Final   Calcium , Total  Date Value Ref Range Status  10/27/2014 8.9 8.5 - 10.1 mg/dL Final   Calcium , Ion  Date Value Ref Range Status  03/18/2011 1.16 1.12 - 1.32 mmol/L Final   Sodium  Date Value Ref Range Status  08/09/2024 139 135 - 146 mmol/L Final   09/07/2017 142 134 - 144 mmol/L Final  10/27/2014 144 136 - 145 mmol/L Final   Potassium  Date Value Ref Range Status  08/09/2024 4.5 3.5 - 5.3 mmol/L Final  10/27/2014 4.2 3.5 - 5.1 mmol/L Final  08/27/2011 4.4 mmol/L Final   Chloride  Date Value Ref Range Status  08/09/2024 105 98 - 110 mmol/L Final  10/27/2014 107 98 - 107 mmol/L Final  08/27/2011 102 mmol/L Final   BUN  Date Value Ref Range Status  08/09/2024 8 7 - 25 mg/dL Final  89/84/7981 7 6 - 24 mg/dL Final  87/95/7984 10 7 - 18 mg/dL Final   Creat  Date Value Ref Range Status  08/09/2024 0.95 0.50 - 1.05 mg/dL Final   Creatinine,U  Date Value Ref Range Status  07/08/2010 111.9 mg/dL Final    Comment:    See lab report for associated comment(s)   CO2  Date Value Ref Range Status  08/09/2024 28 20 - 32 mmol/L Final  08/27/2011 25 mmol/L Final   Co2  Date Value Ref Range Status  10/27/2014 29 21 - 32 mmol/L Final   TCO2  Date Value Ref Range Status  03/18/2011 24 0 -  100 mmol/L Final   GFR, Est African American  Date Value Ref Range Status  05/14/2020 68 > OR = 60 mL/min/1.38m2 Final   eGFR  Date Value Ref Range Status  08/09/2024 68 > OR = 60 mL/min/1.58m2 Final   GFR, Est Non African American  Date Value Ref Range Status  05/14/2020 58 (L) > OR = 60 mL/min/1.38m2 Final   GFR, Estimated  Date Value Ref Range Status  03/23/2024 >60 >60 mL/min Final    Comment:    (NOTE) Calculated using the CKD-EPI Creatinine Equation (2021)          Passed - TSH in normal range and within 180 days    TSH  Date Value Ref Range Status  08/09/2024 2.72 0.40 - 4.50 mIU/L Final         Passed - eGFR is 30 or above and within 180 days    GFR, Est African American  Date Value Ref Range Status  05/14/2020 68 > OR = 60 mL/min/1.26m2 Final   GFR, Est Non African American  Date Value Ref Range Status  05/14/2020 58 (L) > OR = 60 mL/min/1.16m2 Final   GFR, Estimated  Date Value Ref Range Status   03/23/2024 >60 >60 mL/min Final    Comment:    (NOTE) Calculated using the CKD-EPI Creatinine Equation (2021)    eGFR  Date Value Ref Range Status  08/09/2024 68 > OR = 60 mL/min/1.35m2 Final         Passed - Patient is not pregnant      Passed - Valid encounter within last 6 months    Recent Outpatient Visits           1 month ago Bipolar I disorder (HCC)   [place] [facility] Pickard, Butler DASEN, MD   2 months ago Cerebrovascular accident (CVA), unspecified mechanism (HCC)   [place] [facility] Pickard, Butler DASEN, MD   4 months ago Acute intractable tension-type headache   [place] [facility] Aletha Bene, MD   6 months ago Cerebrovascular accident (CVA), unspecified mechanism (HCC)   [place] [facility] [name], MD   7 months ago Hypothyroidism, unspecified type   [place] [facility] Pickard, Butler DASEN, MD              Passed - CBC within normal limits and completed in the last 6 months    WBC  Date Value Ref Range Status  08/09/2024 10.3 3.8 - 10.8 Thousand/uL Final   RBC  Date Value Ref Range Status  08/09/2024 5.03 3.80 - 5.10 Million/uL Final   Hemoglobin  Date Value Ref Range Status  08/09/2024 11.8 11.7 - 15.5 g/dL Final  89/97/7982 86.9 11.1 - 15.9 g/dL Final   HCT  Date Value Ref Range Status  08/09/2024 39.7 35.0 - 45.0 % Final  09/18/2011 45 % Final   Hematocrit  Date Value Ref Range Status  08/25/2016 39.9 34.0 - 46.6 % Final   MCHC  Date Value Ref Range Status  08/09/2024 29.7 (L) 32.0 - 36.0 g/dL Final    Comment:    For adults, a slight decrease in the calculated MCHC value (in the range of 30 to 32 g/dL) is most likely not clinically significant; however, it should be interpreted with caution in correlation with other red cell parameters and the patient's clinical condition.    [facility]  Date Value Ref Range Status  08/09/2024 23.5 (L) 27.0 - 33.0 pg Final   MCV  Date Value Ref Range Status  08/09/2024 78.9 (L) 80.0 - 100.0 fL Final  08/25/2016 88 79 - 97 fL Final  10/27/2014 87 80 - 100 fL Final  09/18/2011 103.7 fL Final   No results found for: PLTCOUNTKUC, LABPLAT, POCPLA RDW  Date Value Ref Range Status  08/09/2024 15.1 (H) 11.0 - 15.0 % Final  08/25/2016 14.4 12.3 - 15.4 % Final  10/27/2014 14.2 11.5 - 14.5 % Final            Requested Prescriptions  Pending Prescriptions Disp Refills   lithium  carbonate 150 MG capsule [Pharmacy Med Name: lithium  carbonate 150 mg capsule] 90 capsule 1    Sig: TAKE ONE CAPSULE BY MOUTH THREE TIMES DAILY     Not Delegated - Psychiatry:  Antimanics/Bipolar Agents Failed - 10/14/2024  3:20 PM      Failed - This refill cannot be delegated      Failed - Lithium  Level in normal range and within 180 days    Lithium  Lvl  Date Value Ref Range Status  07/21/2019 <0.06 (L) 0.60 - 1.20 mmol/L Final    Comment:    Performed at [facility], [address] [place], [place] [address]         Failed - BMP within normal limits in the last 6 months    Glucose  Date Value Ref Range Status  10/27/2014 94 65 - 99 mg/dL Final  89/96/7987 883 mg/dL Final   Glucose, Bld  Date Value Ref Range Status  08/09/2024 102 (H) 65 - 99 mg/dL Final    Comment:    .            Fasting reference interval . For someone without known diabetes, a glucose value between 100 and 125 mg/dL is consistent with prediabetes and should be confirmed with a follow-up test. .    Glucose-Capillary  Date Value Ref Range Status  03/03/2023 72 70 - 99 mg/dL Final    Comment:    Glucose reference range applies only to samples taken after fasting for at least 8 hours.   Calcium   Date Value Ref Range Status  08/09/2024 8.7 8.6 - 10.4 mg/dL Final  89/96/7987 9.9 mg/dL Final   Calcium , Total  Date Value Ref Range Status  10/27/2014 8.9 8.5 - 10.1 mg/dL Final   Calcium ,  Ion  Date Value Ref Range Status  03/18/2011 1.16 1.12 - 1.32 mmol/L Final   Sodium  Date Value Ref Range Status  08/09/2024 139 135 - 146 mmol/L Final  09/07/2017 142 134 - 144 mmol/L Final  10/27/2014 144 136 - 145 mmol/L Final   Potassium  Date Value Ref Range Status  08/09/2024 4.5 3.5 - 5.3 mmol/L Final  10/27/2014 4.2 3.5 - 5.1 mmol/L Final  08/27/2011 4.4 mmol/L Final   Chloride  Date Value Ref Range Status  08/09/2024 105 98 - 110 mmol/L Final  10/27/2014 107 98 - 107 mmol/L Final  08/27/2011 102 mmol/L Final   BUN  Date Value Ref Range Status  08/09/2024 8 7 - 25 mg/dL Final  89/84/7981 7 6 - 24 mg/dL Final  87/95/7984 10 7 - 18 mg/dL Final   Creat  Date Value Ref Range Status  08/09/2024 0.95 0.50 - 1.05 mg/dL Final   Creatinine,U  Date Value Ref Range Status  07/08/2010 111.9 mg/dL Final    Comment:    See lab report for associated comment(s)  CO2  Date Value Ref Range Status  08/09/2024 28 20 - 32 mmol/L Final  08/27/2011 25 mmol/L Final   Co2  Date Value Ref Range Status  10/27/2014 29 21 - 32 mmol/L Final   TCO2  Date Value Ref Range Status  03/18/2011 24 0 - 100 mmol/L Final   GFR, Est African American  Date Value Ref Range Status  05/14/2020 68 > OR = 60 mL/min/1.96m2 Final   eGFR  Date Value Ref Range Status  08/09/2024 68 > OR = 60 mL/min/1.38m2 Final   GFR, Est Non African American  Date Value Ref Range Status  05/14/2020 58 (L) > OR = 60 mL/min/1.25m2 Final   GFR, Estimated  Date Value Ref Range Status  03/23/2024 >60 >60 mL/min Final    Comment:    (NOTE) Calculated using the CKD-EPI Creatinine Equation (2021)          Passed - TSH in normal range and within 180 days    TSH  Date Value Ref Range Status  08/09/2024 2.72 0.40 - 4.50 mIU/L Final         Passed - eGFR is 30 or above and within 180 days    GFR, Est African American  Date Value Ref Range Status  05/14/2020 68 > OR = 60 mL/min/1.17m2 Final   GFR, Est  Non African American  Date Value Ref Range Status  05/14/2020 58 (L) > OR = 60 mL/min/1.15m2 Final   GFR, Estimated  Date Value Ref Range Status  03/23/2024 >60 >60 mL/min Final    Comment:    (NOTE) Calculated using the CKD-EPI Creatinine Equation (2021)    eGFR  Date Value Ref Range Status  08/09/2024 68 > OR = 60 mL/min/1.8m2 Final         Passed - Patient is not pregnant      Passed - Valid encounter within last 6 months    Recent Outpatient Visits           1 month ago Bipolar I disorder [facility])   [place] [facility] Pickard, Butler DASEN, MD   2 months ago Cerebrovascular accident (CVA), unspecified mechanism (HCC)   [place] [facility] [name], MD   4 months ago Acute intractable tension-type headache   [place] [facility] Aletha Bene, MD   6 months ago Cerebrovascular accident (CVA), unspecified mechanism (HCC)   [place] [facility] Pickard, Butler DASEN, MD   7 months ago Hypothyroidism, unspecified type    [facility] Pickard, Butler DASEN, MD              Passed - CBC within normal limits and completed in the last 6 months    WBC  Date Value Ref Range Status  08/09/2024 10.3 3.8 - 10.8 Thousand/uL Final   RBC  Date Value Ref Range Status  08/09/2024 5.03 3.80 - 5.10 Million/uL Final   Hemoglobin  Date Value Ref Range Status  08/09/2024 11.8 11.7 - 15.5 g/dL Final  89/97/7982 86.9 11.1 - 15.9 g/dL Final   HCT  Date Value Ref Range Status  08/09/2024 39.7 35.0 - 45.0 % Final  09/18/2011 45 % Final   Hematocrit  Date Value Ref Range Status  08/25/2016 39.9 34.0 - 46.6 % Final   MCHC  Date Value Ref Range Status  08/09/2024 29.7 (L) 32.0 - 36.0 g/dL Final    Comment:    For adults, a  slight decrease in the calculated MCHC value (in the range of 30 to 32 g/dL) is most likely not clinically significant; however, it should  be interpreted with caution in correlation with other red cell parameters and the patient's clinical condition.    [facility]  Date Value Ref Range Status  08/09/2024 23.5 (L) 27.0 - 33.0 pg Final   MCV  Date Value Ref Range Status  08/09/2024 78.9 (L) 80.0 - 100.0 fL Final  08/25/2016 88 79 - 97 fL Final  10/27/2014 87 80 - 100 fL Final  09/18/2011 103.7 fL Final   No results found for: PLTCOUNTKUC, LABPLAT, POCPLA RDW  Date Value Ref Range Status  08/09/2024 15.1 (H) 11.0 - 15.0 % Final  08/25/2016 14.4 12.3 - 15.4 % Final  10/27/2014 14.2 11.5 - 14.5 % Final

## 2024-10-17 ENCOUNTER — Other Ambulatory Visit: Payer: Self-pay | Admitting: Family Medicine

## 2024-10-17 MED ORDER — LITHIUM CARBONATE 150 MG PO CAPS: 150.0000 mg | ORAL_CAPSULE | Freq: Three times a day (TID) | ORAL | 3 refills | Status: AC

## 2024-10-21 ENCOUNTER — Encounter: Payer: Self-pay | Admitting: Family Medicine

## 2024-10-27 ENCOUNTER — Other Ambulatory Visit: Payer: Self-pay

## 2024-10-27 DIAGNOSIS — N3289 Other specified disorders of bladder: Secondary | ICD-10-CM

## 2024-10-27 NOTE — Telephone Encounter (Signed)
 Prescription Request  10/27/2024  LOV: 08/29/24  What is the name of the medication or equipment? oxybutynin  (DITROPAN ) 5 MG tablet [[id number]]   Have you contacted your pharmacy to request a refill? Yes   Which pharmacy would you like this sent to?  [facility] [place], [place] - [id number] [place] [address] Phone: [phone] Fax: [phone]    Patient notified that their request is being sent to the clinical staff for review and that they should receive a response within 2 business days.   Please advise at [facility] [phone]

## 2024-10-29 MED ORDER — OXYBUTYNIN CHLORIDE 5 MG PO TABS: 5.0000 mg | ORAL_TABLET | Freq: Three times a day (TID) | ORAL | 0 refills | Status: DC | PRN

## 2024-10-29 NOTE — Telephone Encounter (Signed)
 Requested Prescriptions  Pending Prescriptions Disp Refills   oxybutynin  (DITROPAN ) 5 MG tablet 90 tablet 1    Sig: Take 1 tablet (5 mg total) by mouth every 8 (eight) hours as needed for bladder spasms.     Urology:  Bladder Agents Passed - 10/29/2024  8:58 PM      Passed - Valid encounter within last 12 months    Recent Outpatient Visits           2 months ago Bipolar I disorder [facility])   [place] [facility] Pickard, Butler DASEN, MD   2 months ago Cerebrovascular accident (CVA), unspecified mechanism [facility])   [place] [facility] [name], MD   4 months ago Acute intractable tension-type headache   [place] [facility] Aletha Bene, MD   6 months ago Cerebrovascular accident (CVA), unspecified mechanism [facility])   [facility] [name], MD   8 months ago Hypothyroidism, unspecified type   [place] [facility] Pickard, Butler DASEN, MD

## 2024-11-14 ENCOUNTER — Other Ambulatory Visit: Payer: Self-pay

## 2024-11-14 DIAGNOSIS — I639 Cerebral infarction, unspecified: Secondary | ICD-10-CM

## 2024-11-14 DIAGNOSIS — G40209 Localization-related (focal) (partial) symptomatic epilepsy and epileptic syndromes with complex partial seizures, not intractable, without status epilepticus: Secondary | ICD-10-CM

## 2024-11-14 DIAGNOSIS — K219 Gastro-esophageal reflux disease without esophagitis: Secondary | ICD-10-CM

## 2024-11-14 DIAGNOSIS — K581 Irritable bowel syndrome with constipation: Secondary | ICD-10-CM

## 2024-11-14 NOTE — Telephone Encounter (Signed)
 Prescription Request  11/14/2024  LOV: 08/09/24  What is the name of the medication or equipment? levETIRAcetam  (KEPPRA ) 1000 MG tablet [[id number]]   Have you contacted your pharmacy to request a refill? Yes   Which pharmacy would you like this sent to?  [facility] [place], [place] - [id number] [place] [address] Phone: [phone] Fax: [phone]    Patient notified that their request is being sent to the clinical staff for review and that they should receive a response within 2 business days.   Please advise at [facility] [phone]  Prescription Request  11/14/2024  LOV: 08/09/24  What is the name of the medication or equipment? omeprazole  (PRILOSEC) 40 MG capsule [[id number]]   Have you contacted your pharmacy to request a refill? Yes   Which pharmacy would you like this sent to?  [facility] [place], [place] - [id number] [place] [address] Phone: [phone] Fax: [phone]    Patient notified that their request is being sent to the clinical staff for review and that they should receive a response within 2 business days.   Please advise at [facility] [phone]

## 2024-11-16 MED ORDER — OMEPRAZOLE 40 MG PO CPDR: 40.0000 mg | DELAYED_RELEASE_CAPSULE | Freq: Two times a day (BID) | ORAL | 0 refills | Status: AC

## 2024-11-16 MED ORDER — LEVETIRACETAM 1000 MG PO TABS: 1000.0000 mg | ORAL_TABLET | Freq: Two times a day (BID) | ORAL | 3 refills | Status: AC

## 2024-11-16 NOTE — Telephone Encounter (Signed)
 Requested Prescriptions  Pending Prescriptions Disp Refills   levETIRAcetam  (KEPPRA ) 1000 MG tablet 60 tablet 3    Sig: Take 1 tablet (1,000 mg total) by mouth 2 (two) times daily.     Neurology:  Anticonvulsants - levetiracetam  Passed - 11/16/2024 12:24 PM      Passed - Cr in normal range and within 360 days    Creat  Date Value Ref Range Status  08/09/2024 0.95 0.50 - 1.05 mg/dL Final   Creatinine,U  Date Value Ref Range Status  07/08/2010 111.9 mg/dL Final    Comment:    See lab report for associated comment(s)         Passed - Completed PHQ-2 or PHQ-9 in the last 360 days      Passed - Valid encounter within last 12 months    Recent Outpatient Visits           2 months ago Bipolar I disorder [facility])   [place] [facility] Pickard, Butler DASEN, MD   3 months ago Cerebrovascular accident (CVA), unspecified mechanism [facility])   [place] [facility] [name], MD   5 months ago Acute intractable tension-type headache   [place] [facility] Aletha Bene, MD   7 months ago Cerebrovascular accident (CVA), unspecified mechanism [facility])   [place] [facility] [name], MD   8 months ago Hypothyroidism, unspecified type   [place] [facility] Pickard, Butler DASEN, MD               omeprazole  (PRILOSEC) 40 MG capsule 180 capsule 0    Sig: Take 1 capsule (40 mg total) by mouth in the morning and at bedtime.     Gastroenterology: Proton Pump Inhibitors Passed - 11/16/2024 12:24 PM      Passed - Valid encounter within last 12 months    Recent Outpatient Visits           2 months ago Bipolar I disorder [facility])   [place] [facility] Pickard, Butler DASEN, MD   3 months ago Cerebrovascular accident (CVA), unspecified mechanism [facility])   [facility] [name], MD   5 months ago Acute intractable tension-type headache    [place] [facility] Aletha Bene, MD   7 months ago Cerebrovascular accident (CVA), unspecified mechanism [facility])   [place] [facility] [name], MD   8 months ago Hypothyroidism, unspecified type    [facility] Pickard, Butler DASEN, MD

## 2024-12-16 ENCOUNTER — Other Ambulatory Visit: Payer: Self-pay | Admitting: Family Medicine

## 2024-12-16 ENCOUNTER — Telehealth: Payer: Self-pay

## 2024-12-16 DIAGNOSIS — N3289 Other specified disorders of bladder: Secondary | ICD-10-CM

## 2024-12-16 NOTE — Telephone Encounter (Signed)
 Spoke with Hoy at [facility] to verify Lyrica  dose. Dose verified. Mjp,lpn

## 2024-12-16 NOTE — Telephone Encounter (Signed)
 Requested Prescriptions  Pending Prescriptions Disp Refills   pregabalin  (LYRICA ) 50 MG capsule [Pharmacy Med Name: pregabalin  50 mg capsule] 20 capsule 0    Sig: TAKE (1) CAPSULE BY MOUTH TWICE DAILY.     Not Delegated - Neurology:  Anticonvulsants - Controlled - pregabalin  Failed - 12/16/2024  3:37 PM      Failed - This refill cannot be delegated      Passed - Cr in normal range and within 360 days    Creat  Date Value Ref Range Status  08/09/2024 0.95 0.50 - 1.05 mg/dL Final   Creatinine,U  Date Value Ref Range Status  07/08/2010 111.9 mg/dL Final    Comment:    See lab report for associated comment(s)         Passed - Completed PHQ-2 or PHQ-9 in the last 360 days      Passed - Valid encounter within last 12 months    Recent Outpatient Visits           3 months ago Bipolar I disorder [facility])   [place] [facility] Pickard, Butler DASEN, MD   4 months ago Cerebrovascular accident (CVA), unspecified mechanism [facility])   [place] [facility] [name], MD   6 months ago Acute intractable tension-type headache   [place] [facility] Aletha Bene, MD   8 months ago Cerebrovascular accident (CVA), unspecified mechanism (HCC)   [place] [facility] [name], MD   9 months ago Hypothyroidism, unspecified type   [place] [facility] Pickard, Butler DASEN, MD               oxybutynin  (DITROPAN ) 5 MG tablet [Pharmacy Med Name: oxybutynin  chloride 5 mg tablet] 90 tablet 0    Sig: Take 1 tablet (5 mg total) by mouth every 8 (eight) hours as needed for bladder spasms.     Urology:  Bladder Agents Passed - 12/16/2024  3:37 PM      Passed - Valid encounter within last 12 months    Recent Outpatient Visits           3 months ago Bipolar I disorder [facility])   [place] [facility] Pickard, Butler DASEN, MD   4 months ago Cerebrovascular accident (CVA),  unspecified mechanism [facility])   [place] [facility] [name], MD   6 months ago Acute intractable tension-type headache   [place] [facility] Aletha Bene, MD   8 months ago Cerebrovascular accident (CVA), unspecified mechanism [facility])   [place] [facility] [name], MD   9 months ago Hypothyroidism, unspecified type    [facility] Pickard, Butler DASEN, MD

## 2024-12-16 NOTE — Telephone Encounter (Signed)
 Requested medications are due for refill today.  yes  Requested medications are on the active medications list.  yes  Last refill. 08/09/2024 #90 3 rf  Future visit scheduled.   no  Notes to clinic.  Refill not delegated.     Requested Prescriptions  Pending Prescriptions Disp Refills   pregabalin  (LYRICA ) 50 MG capsule [Pharmacy Med Name: pregabalin  50 mg capsule] 20 capsule 0    Sig: TAKE (1) CAPSULE BY MOUTH TWICE DAILY.     Not Delegated - Neurology:  Anticonvulsants - Controlled - pregabalin  Failed - 12/16/2024  3:37 PM      Failed - This refill cannot be delegated      Passed - Cr in normal range and within 360 days    Creat  Date Value Ref Range Status  08/09/2024 0.95 0.50 - 1.05 mg/dL Final   Creatinine,U  Date Value Ref Range Status  07/08/2010 111.9 mg/dL Final    Comment:    See lab report for associated comment(s)         Passed - Completed PHQ-2 or PHQ-9 in the last 360 days      Passed - Valid encounter within last 12 months    Recent Outpatient Visits           3 months ago Bipolar I disorder [facility])   [place] [facility] Pickard, Butler DASEN, MD   4 months ago Cerebrovascular accident (CVA), unspecified mechanism [facility])   [place] [facility] [name], MD   6 months ago Acute intractable tension-type headache   [place] [facility] Aletha Bene, MD   8 months ago Cerebrovascular accident (CVA), unspecified mechanism [facility])   [place] [facility] [name], MD   9 months ago Hypothyroidism, unspecified type   [place] [facility] Pickard, Butler DASEN, MD              Signed Prescriptions Disp Refills   oxybutynin  (DITROPAN ) 5 MG tablet 90 tablet 0    Sig: Take 1 tablet (5 mg total) by mouth every 8 (eight) hours as needed for bladder spasms.     Urology:  Bladder Agents Passed - 12/16/2024  3:37 PM      Passed - Valid encounter  within last 12 months    Recent Outpatient Visits           3 months ago Bipolar I disorder [facility])   [place] [facility] Pickard, Butler DASEN, MD   4 months ago Cerebrovascular accident (CVA), unspecified mechanism [place] [facility])   [place] [facility] [name], MD   6 months ago Acute intractable tension-type headache   [facility] Aletha Bene, MD   8 months ago Cerebrovascular accident (CVA), unspecified mechanism [facility])   [place] [facility] [name], MD   9 months ago Hypothyroidism, unspecified type    [facility] Pickard, Butler DASEN, MD

## 2024-12-22 ENCOUNTER — Other Ambulatory Visit: Payer: Self-pay | Admitting: Family Medicine

## 2024-12-22 ENCOUNTER — Ambulatory Visit: Payer: Self-pay

## 2024-12-22 MED ORDER — TRIAMCINOLONE ACETONIDE 0.1 % MT PSTE: 1.0000 | PASTE | Freq: Two times a day (BID) | OROMUCOSAL | 12 refills | Status: AC
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
## 2025-06-21 ENCOUNTER — Ambulatory Visit: Payer: MEDICAID | Admitting: Neurology
